# Patient Record
Sex: Male | Born: 1945 | Race: White | Hispanic: No | Marital: Married | Smoking: Never smoker
Health system: Southern US, Community
[De-identification: ages and names within clinical notes are randomized; demographics above are authoritative.]

## PROBLEM LIST (undated history)

## (undated) ENCOUNTER — Emergency Department (HOSPITAL_COMMUNITY): Payer: Medicare HMO

## (undated) DIAGNOSIS — I499 Cardiac arrhythmia, unspecified: Secondary | ICD-10-CM

## (undated) DIAGNOSIS — I209 Angina pectoris, unspecified: Secondary | ICD-10-CM

## (undated) DIAGNOSIS — I10 Essential (primary) hypertension: Secondary | ICD-10-CM

## (undated) DIAGNOSIS — I38 Endocarditis, valve unspecified: Secondary | ICD-10-CM

## (undated) DIAGNOSIS — N390 Urinary tract infection, site not specified: Secondary | ICD-10-CM

## (undated) DIAGNOSIS — C801 Malignant (primary) neoplasm, unspecified: Secondary | ICD-10-CM

## (undated) DIAGNOSIS — E119 Type 2 diabetes mellitus without complications: Secondary | ICD-10-CM

## (undated) DIAGNOSIS — G4730 Sleep apnea, unspecified: Secondary | ICD-10-CM

## (undated) DIAGNOSIS — I2510 Atherosclerotic heart disease of native coronary artery without angina pectoris: Secondary | ICD-10-CM

## (undated) HISTORY — PX: EYE SURGERY: SHX253

## (undated) HISTORY — PX: CARDIAC CATHETERIZATION: SHX172

## (undated) HISTORY — PX: JOINT REPLACEMENT: SHX530

## (undated) HISTORY — PX: OTHER SURGICAL HISTORY: SHX169

---

## 2020-11-10 ENCOUNTER — Ambulatory Visit (HOSPITAL_COMMUNITY)
Admission: AD | Admit: 2020-11-10 | Discharge: 2020-11-10 | Disposition: A | Discharge status: Home / Self Care | Payer: Medicare HMO | Attending: Ophthalmology | Admitting: Ophthalmology

## 2020-11-10 ENCOUNTER — Inpatient Hospital Stay (HOSPITAL_COMMUNITY): Payer: Medicare HMO | Admitting: Anesthesiology

## 2020-11-10 ENCOUNTER — Other Ambulatory Visit: Payer: Self-pay

## 2020-11-10 ENCOUNTER — Other Ambulatory Visit: Payer: Self-pay | Admitting: Ophthalmology

## 2020-11-10 ENCOUNTER — Encounter (HOSPITAL_COMMUNITY)
Admission: AD | Disposition: A | Discharge status: Home / Self Care | Payer: Self-pay | Source: Home / Self Care | Attending: Ophthalmology

## 2020-11-10 ENCOUNTER — Encounter (HOSPITAL_COMMUNITY): Payer: Self-pay | Admitting: Ophthalmology

## 2020-11-10 DIAGNOSIS — Z20822 Contact with and (suspected) exposure to covid-19: Secondary | ICD-10-CM | POA: Diagnosis not present

## 2020-11-10 DIAGNOSIS — Z7901 Long term (current) use of anticoagulants: Secondary | ICD-10-CM | POA: Insufficient documentation

## 2020-11-10 DIAGNOSIS — E113392 Type 2 diabetes mellitus with moderate nonproliferative diabetic retinopathy without macular edema, left eye: Secondary | ICD-10-CM | POA: Diagnosis not present

## 2020-11-10 DIAGNOSIS — Z7984 Long term (current) use of oral hypoglycemic drugs: Secondary | ICD-10-CM | POA: Diagnosis not present

## 2020-11-10 DIAGNOSIS — H33012 Retinal detachment with single break, left eye: Secondary | ICD-10-CM | POA: Diagnosis not present

## 2020-11-10 DIAGNOSIS — Z79899 Other long term (current) drug therapy: Secondary | ICD-10-CM | POA: Diagnosis not present

## 2020-11-10 DIAGNOSIS — H33002 Unspecified retinal detachment with retinal break, left eye: Secondary | ICD-10-CM

## 2020-11-10 DIAGNOSIS — H3322 Serous retinal detachment, left eye: Secondary | ICD-10-CM | POA: Diagnosis present

## 2020-11-10 HISTORY — DX: Type 2 diabetes mellitus without complications: E11.9

## 2020-11-10 HISTORY — DX: Cardiac arrhythmia, unspecified: I49.9

## 2020-11-10 HISTORY — PX: PHOTOCOAGULATION WITH LASER: SHX6027

## 2020-11-10 HISTORY — DX: Angina pectoris, unspecified: I20.9

## 2020-11-10 HISTORY — DX: Essential (primary) hypertension: I10

## 2020-11-10 HISTORY — DX: Atherosclerotic heart disease of native coronary artery without angina pectoris: I25.10

## 2020-11-10 HISTORY — DX: Sleep apnea, unspecified: G47.30

## 2020-11-10 HISTORY — PX: INJECTION OF SILICONE OIL: SHX6422

## 2020-11-10 HISTORY — PX: PARS PLANA VITRECTOMY: SHX2166

## 2020-11-10 LAB — BASIC METABOLIC PANEL
Anion gap: 8 (ref 5–15)
BUN: 22 mg/dL (ref 8–23)
CO2: 22 mmol/L (ref 22–32)
Calcium: 8.8 mg/dL — ABNORMAL LOW (ref 8.9–10.3)
Chloride: 108 mmol/L (ref 98–111)
Creatinine, Ser: 1.19 mg/dL (ref 0.61–1.24)
GFR, Estimated: >60 mL/min (ref >60)
Glucose, Bld: 147 mg/dL — ABNORMAL HIGH (ref 70–99)
Potassium: 6.9 mmol/L (ref 3.5–5.1)
Sodium: 138 mmol/L (ref 135–145)

## 2020-11-10 LAB — POCT I-STAT, CHEM 8
BUN: 22 mg/dL (ref 8–23)
Calcium, Ion: 1.12 mmol/L — ABNORMAL LOW (ref 1.15–1.40)
Chloride: 107 mmol/L (ref 98–111)
Creatinine, Ser: 1.2 mg/dL (ref 0.61–1.24)
Glucose, Bld: 117 mg/dL — ABNORMAL HIGH (ref 70–99)
HCT: 34 % — ABNORMAL LOW (ref 39.0–52.0)
Hemoglobin: 11.6 g/dL — ABNORMAL LOW (ref 13.0–17.0)
Potassium: 4.3 mmol/L (ref 3.5–5.1)
Sodium: 141 mmol/L (ref 135–145)
TCO2: 24 mmol/L (ref 22–32)

## 2020-11-10 LAB — GLUCOSE, CAPILLARY
Glucose-Capillary: 161 mg/dL — ABNORMAL HIGH (ref 70–99)
Glucose-Capillary: 95 mg/dL (ref 70–99)

## 2020-11-10 LAB — CBC
HCT: 35.1 % — ABNORMAL LOW (ref 39.0–52.0)
Hemoglobin: 11.7 g/dL — ABNORMAL LOW (ref 13.0–17.0)
MCH: 27.5 pg (ref 26.0–34.0)
MCHC: 33.3 g/dL (ref 30.0–36.0)
MCV: 82.4 fL (ref 80.0–100.0)
RBC: 4.26 MIL/uL (ref 4.22–5.81)
RDW: 17.1 % — ABNORMAL HIGH (ref 11.5–15.5)
WBC: 3 10*3/uL — ABNORMAL LOW (ref 4.0–10.5)
nRBC: 0 % (ref 0.0–0.2)

## 2020-11-10 LAB — SARS CORONAVIRUS 2 BY RT PCR (HOSPITAL ORDER, PERFORMED IN ~~LOC~~ HOSPITAL LAB): SARS Coronavirus 2: NEGATIVE

## 2020-11-10 SURGERY — PARS PLANA VITRECTOMY WITH 25 GAUGE
Anesthesia: Monitor Anesthesia Care | Site: Eye | Laterality: Left

## 2020-11-10 MED ORDER — BUPIVACAINE HCL (PF) 0.75 % IJ SOLN
INTRAMUSCULAR | Status: AC
  Filled 2020-11-10: qty 10

## 2020-11-10 MED ORDER — ORAL CARE MOUTH RINSE: 15.0000 mL | Freq: Once | OROMUCOSAL | Status: AC

## 2020-11-10 MED ORDER — MIDAZOLAM HCL 2 MG/2ML IJ SOLN
INTRAMUSCULAR | Status: AC
  Filled 2020-11-10: qty 2

## 2020-11-10 MED ORDER — LIDOCAINE HCL 2 % IJ SOLN
INTRAMUSCULAR | Status: AC
  Filled 2020-11-10: qty 20

## 2020-11-10 MED ORDER — BSS IO SOLN
INTRAOCULAR | Status: AC
  Filled 2020-11-10: qty 15

## 2020-11-10 MED ORDER — STERILE WATER FOR INJECTION IJ SOLN
INTRAMUSCULAR | Status: AC
  Filled 2020-11-10: qty 10

## 2020-11-10 MED ORDER — HYALURONIDASE HUMAN 150 UNIT/ML IJ SOLN
INTRAMUSCULAR | Status: AC
  Filled 2020-11-10: qty 1

## 2020-11-10 MED ORDER — MIDAZOLAM HCL 2 MG/2ML IJ SOLN
INTRAMUSCULAR | Status: DC | PRN
  Administered 2020-11-10: 1 mg via INTRAVENOUS

## 2020-11-10 MED ORDER — PROPOFOL 10 MG/ML IV BOLUS
INTRAVENOUS | Status: DC | PRN
  Administered 2020-11-10 (×2): 30 mg via INTRAVENOUS

## 2020-11-10 MED ORDER — HYPROMELLOSE (GONIOSCOPIC) 2.5 % OP SOLN
OPHTHALMIC | Status: DC | PRN
  Administered 2020-11-10: 1 [drp] via OPHTHALMIC

## 2020-11-10 MED ORDER — CHLORHEXIDINE GLUCONATE 0.12 % MT SOLN: 15.0000 mL | Freq: Once | OROMUCOSAL | Status: AC

## 2020-11-10 MED ORDER — EPINEPHRINE PF 1 MG/ML IJ SOLN
INTRAOCULAR | Status: DC | PRN
  Administered 2020-11-10: 15:00:00 500 mL

## 2020-11-10 MED ORDER — ATROPINE SULFATE 1 % OP SOLN
1.0000 [drp] | OPHTHALMIC | Status: DC | PRN
  Administered 2020-11-10 (×3): 1 [drp] via OPHTHALMIC
  Filled 2020-11-10: qty 5

## 2020-11-10 MED ORDER — FENTANYL CITRATE (PF) 100 MCG/2ML IJ SOLN: 25.0000 ug | INTRAMUSCULAR | Status: DC | PRN

## 2020-11-10 MED ORDER — ONDANSETRON HCL 4 MG/2ML IJ SOLN
INTRAMUSCULAR | Status: AC
  Filled 2020-11-10: qty 2

## 2020-11-10 MED ORDER — LIDOCAINE HCL 2 % IJ SOLN
INTRAMUSCULAR | Status: DC | PRN
  Administered 2020-11-10: 15:00:00 8 mL via RETROBULBAR

## 2020-11-10 MED ORDER — DEXAMETHASONE SODIUM PHOSPHATE 10 MG/ML IJ SOLN
INTRAMUSCULAR | Status: DC | PRN
  Administered 2020-11-10: 10 mg

## 2020-11-10 MED ORDER — TOBRAMYCIN-DEXAMETHASONE 0.3-0.1 % OP OINT
TOPICAL_OINTMENT | OPHTHALMIC | Status: AC
  Filled 2020-11-10: qty 3.5

## 2020-11-10 MED ORDER — DEXAMETHASONE SODIUM PHOSPHATE 10 MG/ML IJ SOLN
INTRAMUSCULAR | Status: AC
  Filled 2020-11-10: qty 1

## 2020-11-10 MED ORDER — TRIAMCINOLONE ACETONIDE 40 MG/ML IJ SUSP
INTRAMUSCULAR | Status: AC
  Filled 2020-11-10: qty 5

## 2020-11-10 MED ORDER — LACTATED RINGERS IV SOLN: INTRAVENOUS | Status: DC

## 2020-11-10 MED ORDER — ACETAMINOPHEN 500 MG PO TABS
1000.0000 mg | ORAL_TABLET | Freq: Once | ORAL | Status: AC
  Administered 2020-11-10: 13:00:00 1000 mg via ORAL
  Filled 2020-11-10: qty 2

## 2020-11-10 MED ORDER — CEFAZOLIN SUBCONJUNCTIVAL INJECTION 100 MG/0.5 ML
100.0000 mg | INJECTION | SUBCONJUNCTIVAL | Status: DC
  Filled 2020-11-10: qty 5

## 2020-11-10 MED ORDER — CEFTAZIDIME 1 G IJ SOLR
INTRAMUSCULAR | Status: AC
  Filled 2020-11-10: qty 1

## 2020-11-10 MED ORDER — ONDANSETRON HCL 4 MG/2ML IJ SOLN: 4.0000 mg | Freq: Once | INTRAMUSCULAR | Status: DC | PRN

## 2020-11-10 MED ORDER — TOBRAMYCIN-DEXAMETHASONE 0.3-0.1 % OP SUSP
OPHTHALMIC | Status: DC | PRN
  Administered 2020-11-10: 1 [drp] via OPHTHALMIC

## 2020-11-10 MED ORDER — FENTANYL CITRATE (PF) 250 MCG/5ML IJ SOLN
INTRAMUSCULAR | Status: AC
  Filled 2020-11-10: qty 5

## 2020-11-10 MED ORDER — TETRACAINE HCL 0.5 % OP SOLN
OPHTHALMIC | Status: AC
  Filled 2020-11-10: qty 4

## 2020-11-10 MED ORDER — PROPOFOL 500 MG/50ML IV EMUL
INTRAVENOUS | Status: DC | PRN
  Administered 2020-11-10: 75 ug/kg/min via INTRAVENOUS

## 2020-11-10 MED ORDER — ATROPINE SULFATE 1 % OP SOLN
OPHTHALMIC | Status: AC
  Filled 2020-11-10: qty 5

## 2020-11-10 MED ORDER — EPINEPHRINE PF 1 MG/ML IJ SOLN
INTRAMUSCULAR | Status: AC
  Filled 2020-11-10: qty 1

## 2020-11-10 MED ORDER — POLYMYXIN B SULFATE 500000 UNITS IJ SOLR
INTRAMUSCULAR | Status: AC
  Filled 2020-11-10: qty 500000

## 2020-11-10 MED ORDER — ATROPINE SULFATE 1 % OP OINT
TOPICAL_OINTMENT | OPHTHALMIC | Status: DC | PRN
  Administered 2020-11-10: 1 via OPHTHALMIC

## 2020-11-10 MED ORDER — HYPROMELLOSE (GONIOSCOPIC) 2.5 % OP SOLN
OPHTHALMIC | Status: AC
  Filled 2020-11-10: qty 15

## 2020-11-10 MED ORDER — SODIUM CHLORIDE (PF) 0.9 % IJ SOLN
INTRAMUSCULAR | Status: AC
  Filled 2020-11-10: qty 10

## 2020-11-10 MED ORDER — CEFAZOLIN SUBCONJUNCTIVAL INJECTION 100 MG/0.5 ML
INJECTION | SUBCONJUNCTIVAL | Status: DC | PRN
  Administered 2020-11-10: 100 mg via SUBCONJUNCTIVAL

## 2020-11-10 MED ORDER — PHENYLEPHRINE HCL-NACL 10-0.9 MG/250ML-% IV SOLN
INTRAVENOUS | Status: DC | PRN
  Administered 2020-11-10: 20 ug/min via INTRAVENOUS

## 2020-11-10 MED ORDER — PROPOFOL 10 MG/ML IV BOLUS
INTRAVENOUS | Status: AC
  Filled 2020-11-10: qty 20

## 2020-11-10 MED ORDER — CHLORHEXIDINE GLUCONATE 0.12 % MT SOLN
OROMUCOSAL | Status: AC
  Administered 2020-11-10: 13:00:00 15 mL via OROMUCOSAL
  Filled 2020-11-10: qty 15

## 2020-11-10 MED ORDER — ONDANSETRON HCL 4 MG/2ML IJ SOLN
INTRAMUSCULAR | Status: DC | PRN
  Administered 2020-11-10: 4 mg via INTRAVENOUS

## 2020-11-10 MED ORDER — BSS PLUS IO SOLN
INTRAOCULAR | Status: AC
  Filled 2020-11-10: qty 500

## 2020-11-10 MED ORDER — PHENYLEPHRINE HCL 2.5 % OP SOLN
1.0000 [drp] | OPHTHALMIC | Status: DC | PRN
  Administered 2020-11-10 (×3): 1 [drp] via OPHTHALMIC
  Filled 2020-11-10: qty 2

## 2020-11-10 SURGICAL SUPPLY — 42 items
APPLICATOR COTTON TIP 6 STRL (MISCELLANEOUS) ×2 IMPLANT
APPLICATOR COTTON TIP 6IN STRL (MISCELLANEOUS) ×4
APPLICATOR DR MATTHEWS STRL (MISCELLANEOUS) ×4 IMPLANT
BAND WRIST GAS GREEN (MISCELLANEOUS) IMPLANT
BNDG EYE OVAL (GAUZE/BANDAGES/DRESSINGS) ×4 IMPLANT
CABLE BIPOLOR RESECTION CORD (MISCELLANEOUS) ×4 IMPLANT
CANNULA ANT CHAM MAIN (OPHTHALMIC RELATED) ×4 IMPLANT
CANNULA ANT/CHMB 27GA (MISCELLANEOUS) ×4 IMPLANT
CANNULA TROCAR 23 GA VLV (OPHTHALMIC) IMPLANT
CANNULA VLV SOFT TIP 25GA (OPHTHALMIC) ×4 IMPLANT
CAUTERY EYE LOW TEMP 1300F FIN (OPHTHALMIC RELATED) IMPLANT
COVER MAYO STAND STRL (DRAPES) ×4 IMPLANT
COVER SURGICAL LIGHT HANDLE (MISCELLANEOUS) IMPLANT
COVER WAND RF STERILE (DRAPES) IMPLANT
DRAPE HALF SHEET 40X57 (DRAPES) ×4 IMPLANT
GAS AUTO FILL CONSTEL (OPHTHALMIC)
GAS AUTO FILL CONSTELLATION (OPHTHALMIC) IMPLANT
GAS OPHTHALMIC (MISCELLANEOUS) IMPLANT
GAS WRIST BAND GREEN (MISCELLANEOUS)
GLOVE BIO SURGEON STRL SZ7.5 (GLOVE) ×4 IMPLANT
GOWN STRL REUS W/ TWL LRG LVL3 (GOWN DISPOSABLE) ×4 IMPLANT
GOWN STRL REUS W/TWL LRG LVL3 (GOWN DISPOSABLE) ×4
KIT BASIN OR (CUSTOM PROCEDURE TRAY) ×4 IMPLANT
KIT TURNOVER KIT B (KITS) ×4 IMPLANT
LENS BIOM SUPER VIEW SET DISP (MISCELLANEOUS) ×4 IMPLANT
NEEDLE 18GX1X1/2 (RX/OR ONLY) (NEEDLE) ×4 IMPLANT
NEEDLE HYPO 25GX1X1/2 BEV (NEEDLE) IMPLANT
NEEDLE HYPO 30X.5 LL (NEEDLE) ×8 IMPLANT
NS IRRIG 1000ML POUR BTL (IV SOLUTION) ×4 IMPLANT
OIL SILICONE OPHTHALMIC 1000 (Ophthalmic Related) ×4 IMPLANT
PACK VITRECTOMY CUSTOM (CUSTOM PROCEDURE TRAY) ×4 IMPLANT
PAD ARMBOARD 7.5X6 YLW CONV (MISCELLANEOUS) ×4 IMPLANT
PAK PIK VITRECTOMY CVS 25GA (OPHTHALMIC) ×4 IMPLANT
PROBE ENDO DIATHERMY 25G (MISCELLANEOUS) ×4 IMPLANT
PROBE LASER ILLUM FLEX CVD 23G (OPHTHALMIC) IMPLANT
PROBE LASER ILLUM FLEX CVD 25G (OPHTHALMIC) ×4 IMPLANT
SET INJECTOR OIL FLUID CONSTEL (OPHTHALMIC) ×4 IMPLANT
SHIELD EYE LENSE ONLY DISP (GAUZE/BANDAGES/DRESSINGS) ×4 IMPLANT
STOCKINETTE IMPERVIOUS 9X36 MD (GAUZE/BANDAGES/DRESSINGS) ×8 IMPLANT
SYR 20ML LL LF (SYRINGE) IMPLANT
TOWEL GREEN STERILE FF (TOWEL DISPOSABLE) ×8 IMPLANT
WATER STERILE IRR 1000ML POUR (IV SOLUTION) ×4 IMPLANT

## 2020-11-10 NOTE — Anesthesia Postprocedure Evaluation (Signed)
Anesthesia Post Note  Patient: [name]  Procedure(s) Performed: PARS PLANA VITRECTOMY WITH 25 GAUGE (Left Eye) PHOTOCOAGULATION WITH LASER (Left Eye) INJECTION OF SILICONE OIL (Left Eye)     Patient location during evaluation: PACU Anesthesia Type: MAC Level of consciousness: awake and alert Pain management: pain level controlled Vital Signs Assessment: post-procedure vital signs reviewed and stable Respiratory status: spontaneous breathing and respiratory function stable Cardiovascular status: stable Postop Assessment: no apparent nausea or vomiting Anesthetic complications: no   No complications documented.  Last Vitals:  Vitals:   11/10/20 1630 11/10/20 1645  BP: 119/68 126/74  Pulse: 71 67  Resp: 19 13  Temp:    SpO2: 97% 97%    Last Pain:  Vitals:   11/10/20 1645  TempSrc:   PainSc: 0-No pain                 Leverne Amrhein DANIEL

## 2020-11-10 NOTE — H&P (Signed)
[name] is an 74 y.o. male.   Chief Complaint: vision loss OS HPI: Dx with mac off RD OS  Past Medical History:  Diagnosis Date  . Anginal pain ([place])   . Coronary artery disease   . Diabetes mellitus without complication ([place])   . Dysrhythmia   . Hypertension   . Sleep apnea     Past Surgical History:  Procedure Laterality Date  . EYE SURGERY    . JOINT REPLACEMENT Right    hip    History reviewed. No pertinent family history. Social History:  reports that he has never smoked. He has never used smokeless tobacco. He reports previous alcohol use. He reports that he does not use drugs.  Allergies: No Known Allergies  Medications Prior to Admission  Medication Sig Dispense Refill  . atorvastatin (LIPITOR) 20 MG tablet Take 20 mg by mouth daily.    Marland Kitchen ELIQUIS 5 MG TABS tablet Take 5 mg by mouth 2 (two) times daily.    Marland Kitchen glipiZIDE (GLUCOTROL) 10 MG tablet Take 10 mg by mouth 2 (two) times daily.    . hydrochlorothiazide (HYDRODIURIL) 25 MG tablet Take 12.5 mg by mouth daily.    . isosorbide mononitrate (IMDUR) 30 MG 24 hr tablet Take 30 mg by mouth daily.    Marland Kitchen LANTUS SOLOSTAR 100 UNIT/ML Solostar Pen Inject 38 Units into the skin every evening.    Marland Kitchen lisinopril (ZESTRIL) 20 MG tablet Take 20 mg by mouth daily.    . metFORMIN (GLUCOPHAGE) 1000 MG tablet Take 1,000 mg by mouth daily.    . metoprolol tartrate (LOPRESSOR) 25 MG tablet Take 25 mg by mouth 3 (three) times daily.    . nitroGLYCERIN (NITROSTAT) 0.3 MG SL tablet Place 0.3 mg under the tongue every 5 (five) minutes as needed for chest pain.    Marland Kitchen terazosin (HYTRIN) 2 MG capsule Take 2 mg by mouth at bedtime.      Results for orders placed or performed during the hospital encounter of 11/10/20 (from the past 48 hour(s))  SARS Coronavirus 2 by RT PCR (hospital order, performed in [facility] hospital lab) Nasopharyngeal Nasopharyngeal Swab     Status: None   Collection Time: 11/10/20 11:42 AM   Specimen: Nasopharyngeal  Swab  Result Value Ref Range   SARS Coronavirus 2 NEGATIVE NEGATIVE    Comment: (NOTE) SARS-CoV-2 target nucleic acids are NOT DETECTED.  The SARS-CoV-2 RNA is generally detectable in upper and lower respiratory specimens during the acute phase of infection. The lowest concentration of SARS-CoV-2 viral copies this assay can detect is 250 copies / mL. A negative result does not preclude SARS-CoV-2 infection and should not be used as the sole basis for treatment or other patient management decisions.  A negative result may occur with improper specimen collection / handling, submission of specimen other than nasopharyngeal swab, presence of viral mutation(s) within the areas targeted by this assay, and inadequate number of viral copies (<250 copies / mL). A negative result must be combined with clinical observations, patient history, and epidemiological information.  Fact Sheet for Patients:   StrictlyIdeas.no  Fact Sheet for Healthcare Providers: BankingDealers.co.za  This test is not yet approved or  cleared by the [place] FDA and has been authorized for detection and/or diagnosis of SARS-CoV-2 by FDA under an Emergency Use Authorization (EUA).  This EUA will remain in effect (meaning this test can be used) for the duration of the COVID-19 declaration under Section 564(b)(1) of the Act, 21 U.S.C.  section 360bbb-3(b)(1), unless the authorization is terminated or revoked sooner.  Performed at [facility], [place] [address] [place], [place] [address]   Glucose, capillary     Status: Abnormal   Collection Time: 11/10/20 12:04 PM  Result Value Ref Range   Glucose-Capillary 161 (H) 70 - 99 mg/dL    Comment: Glucose reference range applies only to samples taken after fasting for at least 8 hours.   No results found.  Review of Systems  Eyes: Positive for visual disturbance.  All other systems reviewed and are  negative.   Blood pressure (!) 167/76, pulse 74, temperature 97.7 F (36.5 C), temperature source Oral, resp. rate 20, height 6' (1.829 m), weight 113.4 kg, SpO2 99 %. Physical Exam Constitutional:      Appearance: He is obese.  Eyes:   Neurological:     Mental Status: He is alert.      Assessment/Plan 1. Rd OS: PPV/EC/EL/GFX vs SO OS  Jerlean Peralta B, MD 11/10/2020, 1:51 PM

## 2020-11-10 NOTE — Anesthesia Preprocedure Evaluation (Addendum)
Anesthesia Evaluation  Patient identified by MRN, date of birth, ID band Patient awake    Reviewed: Allergy & Precautions, NPO status , Patient's Chart, lab work & pertinent test results, reviewed documented beta blocker date and time   Airway Mallampati: III  TM Distance: >3 FB Neck ROM: Full    Dental  (+) Teeth Intact, Dental Advisory Given Permanent bridges:   Pulmonary sleep apnea and Continuous Positive Airway Pressure Ventilation ,    Pulmonary exam normal breath sounds clear to auscultation       Cardiovascular hypertension, Pt. on medications and Pt. on home beta blockers + CAD  + dysrhythmias (on eliquis, LD yesterday AM) Atrial Fibrillation  Rhythm:Irregular Rate:Normal  Has not taken any medications today, takes metoprolol 3x/d (last dose yesterday 10PM)   Neuro/Psych negative neurological ROS  negative psych ROS   GI/Hepatic negative GI ROS, Neg liver ROS,   Endo/Other  diabetes, Poorly Controlled, Type 2, Oral Hypoglycemic Agents, Insulin DependentT2DM on insulin-38 units last night (his normal dose) Obesity BMI 34 FS 161 at 1204  Renal/GU negative Renal ROS  negative genitourinary   Musculoskeletal negative musculoskeletal ROS (+)   Abdominal (+) + obese,   Peds  Hematology  (+) Blood dyscrasia, anemia , hct 35.1    Anesthesia Other Findings Left retinal detachment   Reproductive/Obstetrics negative OB ROS                           Anesthesia Physical Anesthesia Plan  ASA: III  Anesthesia Plan: MAC   Post-op Pain Management:    Induction:   PONV Risk Score and Plan: Propofol infusion, TIVA and Treatment may vary due to age or medical condition  Airway Management Planned: Natural Airway and Nasal Cannula  Additional Equipment: None  Intra-op Plan:   Post-operative Plan:   Informed Consent: I have reviewed the patients History and Physical, chart, labs and  discussed the procedure including the risks, benefits and alternatives for the proposed anesthesia with the patient or authorized representative who has indicated his/her understanding and acceptance.       Plan Discussed with: CRNA  Anesthesia Plan Comments:        Anesthesia Quick Evaluation

## 2020-11-10 NOTE — Transfer of Care (Signed)
Immediate Anesthesia Transfer of Care Note  Patient: [name]  Procedure(s) Performed: PARS PLANA VITRECTOMY WITH 25 GAUGE (Left Eye) PHOTOCOAGULATION WITH LASER (Left Eye) INJECTION OF SILICONE OIL (Left Eye)  Patient Location: PACU  Anesthesia Type:MAC  Level of Consciousness: drowsy  Airway & Oxygen Therapy: Patient Spontanous Breathing and Patient connected to face mask oxygen  Post-op Assessment: Report given to RN and Post -op Vital signs reviewed and stable  Post vital signs: Reviewed and stable  Last Vitals:  Vitals Value Taken Time  BP 108/59 11/10/20 1559  Temp    Pulse 61 11/10/20 1600  Resp 20 11/10/20 1600  SpO2 99 % 11/10/20 1600  Vitals shown include unvalidated device data.  Last Pain:  Vitals:   11/10/20 1233  TempSrc:   PainSc: 0-No pain         Complications: No complications documented.

## 2020-11-10 NOTE — Anesthesia Procedure Notes (Signed)
Procedure Name: MAC Date/Time: 11/10/2020 2:51 PM Performed by: Reece Agar, CRNA Pre-anesthesia Checklist: Patient identified, Emergency Drugs available, Suction available and Patient being monitored Patient Re-evaluated:Patient Re-evaluated prior to induction Oxygen Delivery Method: Simple face mask

## 2020-11-10 NOTE — OR Nursing (Signed)
Pt is awake,alert and oriented. Pt is in NAD at this time. Pt and family verbalized understanding of poc and discharge instructions. instructions given to family and reviewed prior to discharge.   

## 2020-11-10 NOTE — Progress Notes (Signed)
CRITICAL VALUE ALERT  Critical Value:  Hemolyzed potassium  Date & Time Notied: 11/10/2020 1443  Provider Notified: Dr. Salvadore Farber  Orders Received/Actions taken: Due to previous lab delay I-Stat was already in process. I- stat confirmed normokalemia.

## 2020-11-10 NOTE — Op Note (Signed)
NAMECARLYLE, [name] MEDICAL RECORD [id number] ACCOUNT [id number] DATE OF BIRTH:[birth year] FACILITY: [facility] LOCATION: [facility] PHYSICIAN:Yeraldi Fidler Greg Cutter, MD  OPERATIVE REPORT  DATE OF PROCEDURE:  11/10/2020  SURGEON:  Sherlynn Stalls, MD  ANESTHESIA:  MAC with retrobulbar block of the left eye.  PREOPERATIVE DIAGNOSIS:  Retinal detachment, left eye.  POSTOPERATIVE DIAGNOSIS:  Retinal detachment, left eye.  OPERATIVE PROCEDURE:  A 25-gauge pars plana vitrectomy with endolaser endocautery, gas fluid exchange and silicone oil insertion of the left eye.  FINDINGS:  There was a single retinal tear located at 3 o'clock in the left eye with a macula off retinal detachment that had a chronic appearance.  COMPLICATIONS:  None.  DESCRIPTION OF PROCEDURE:  The patient was identified in the preoperative holding area.  He was then taken to the operating room where he was sedated by the anesthesia team.  At that point in time, the left eye was anesthetized using a retrobulbar block  consisting of a 1:1 mixture of 0.75% bupivacaine and 1% lidocaine and 150 units of Hylenex.  After excellent akinesia and anesthesia was obtained, the left eye was prepped and draped in the usual sterile fashion for ocular surgery including trimming of  lashes.  A Lieberman speculum was placed between the left upper and lower eyelids for exposure.  25-gauge trocars were used to introduce transconjunctival cannulas in the inferotemporal, superotemporal, and superonasal quadrants.  Trocars were removed  leaving the cannulas in place.  An infusion cannula was attached to the inferior temporal cannula and confirmed to be in the vitreous cavity prior to its use.  The eye then underwent a core vitrectomy with the vitreous cutter and light pipe.  Vitreous  was cored out without difficulty.  The eye was then inspected with scleral depression.  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  There  was a temporal macula off retinal detachment with a single retinal break at 3  o'clock.  No other tears were identified.  The retinal break was marked using endocautery.  A soft tip extrusion canula was then used to perform an air-fluid exchange.  Due to the anterior location of the retinal break, a posterior retinotomy was  necessary to drain the subretinal fluid.  Therefore, endocautery was used to create a small retinotomy superior to the macula in the posterior pole.  After this was performed, a soft tip extrusion cannula was used to extrude the remaining subretinal  fluid.  This allowed the retina flatten nicely.  Endolaser photocoagulation was then placed around the retinal break and the retinotomy.  Due to the chronic nature of the retinal detachment and comorbidities of the patient, decision was made to put in  silicone oil.  Therefore, the eye was filled with 3151 centistoke silicone oil up to the lens iris diaphragm.  A peripheral iridectomy was not placed due to the patient's treatment with Eliquis.  Oil was brought up to the lens iris diaphragm.  The eye  was left comfortably soft and not over filled.  There was no oil in the anterior chamber.  Once this was completed, the cannula was removed from the sclerotomies.  Sclerotomies were inspected and found to be secure and not leaking.  Therefore, the eye  was treated with subconjunctival injections of 50 mg Ancef and 1 mg dexamethasone.  The eye was treated topically with one drop of 1% atropine and TobraDex ointment.  The speculum was removed.  Eyelids were cleaned and closed and then patched and  shielded.  The  patient was then taken to recovery in excellent condition, having tolerated the procedure very well.  HN/NUANCE  D:11/10/2020 T:11/10/2020 JOB:[id number]

## 2020-11-10 NOTE — Brief Op Note (Signed)
11/10/2020  4:00 PM  PATIENT:  [name]  74 y.o. male  PRE-OPERATIVE DIAGNOSIS:  LEFT RETNAL DETACHMENT  POST-OPERATIVE DIAGNOSIS:  LEFT RETNAL DETACHMENT  PROCEDURE:  Procedure(s): PARS PLANA VITRECTOMY WITH 25 GAUGE (Left) PHOTOCOAGULATION WITH LASER (Left) INJECTION OF SILICONE OIL (Left)  SURGEON:  Surgeon(s) and Role:    Sherlynn Stalls, MD - Primary  PHYSICIAN ASSISTANT:   ASSISTANTS: none   ANESTHESIA:   MAC  EBL:  minimal  BLOOD ADMINISTERED:none  DRAINS: none   LOCAL MEDICATIONS USED:  BUPIVICAINE   SPECIMEN:  No Specimen  DISPOSITION OF SPECIMEN:  N/A  COUNTS:  YES  TOURNIQUET:  * No tourniquets in log *  DICTATION: .Other Dictation: Dictation Number [id number]  PLAN OF CARE: Discharge to home after PACU  PATIENT DISPOSITION:  PACU - hemodynamically stable.   Delay start of Pharmacological VTE agent (>24hrs) due to surgical blood loss or risk of bleeding: not applicable

## 2020-11-11 ENCOUNTER — Encounter (HOSPITAL_COMMUNITY): Payer: Self-pay | Admitting: Ophthalmology

## 2020-11-11 LAB — GLUCOSE, CAPILLARY: Glucose-Capillary: 121 mg/dL — ABNORMAL HIGH (ref 70–99)

## 2020-12-16 ENCOUNTER — Other Ambulatory Visit: Payer: Self-pay | Admitting: Ophthalmology

## 2020-12-23 ENCOUNTER — Other Ambulatory Visit (HOSPITAL_COMMUNITY)
Admission: RE | Admit: 2020-12-23 | Discharge: 2020-12-23 | Disposition: A | Discharge status: Home / Self Care | Payer: Medicare HMO | Source: Ambulatory Visit | Attending: Ophthalmology | Admitting: Ophthalmology

## 2020-12-23 DIAGNOSIS — Z01812 Encounter for preprocedural laboratory examination: Secondary | ICD-10-CM | POA: Diagnosis present

## 2020-12-23 DIAGNOSIS — Z20822 Contact with and (suspected) exposure to covid-19: Secondary | ICD-10-CM | POA: Insufficient documentation

## 2020-12-23 LAB — SARS CORONAVIRUS 2 (TAT 6-24 HRS): SARS Coronavirus 2: NEGATIVE

## 2020-12-24 ENCOUNTER — Other Ambulatory Visit: Payer: Self-pay

## 2020-12-24 ENCOUNTER — Encounter (HOSPITAL_COMMUNITY): Payer: Self-pay | Admitting: Ophthalmology

## 2020-12-24 NOTE — Progress Notes (Signed)
PCP - [name], MD Cardiologist - Marina Goodell, MD   - recent EKG tracings requested  Chest x-ray - n/a EKG - 12/15/20 C.[facility].  Stress Test - deneis ECHO - 09/18/20 C.E.  Cardiac Cath - 11/21/20 C.[facility].  Per pt, via [place] interpreter - LD Eliquis and Plavix was 12/23/20  COVID TEST- 12/23/20 neg result  Anesthesia review: yes  -------------  SDW INSTRUCTIONS:  Your procedure is scheduled on 12/25/20. Please report to [facility] Main Entrance "A" at 11:00 A.M., and check in at the Admitting office. Call this number if you have problems the morning of surgery: [phone]   Remember: Do not eat or drink after midnight the night before your surgery   Medications to take morning of surgery with a sip of water include: amiodarone (PACERONE) isosorbide mononitrate (IMDUR) metoprolol tartrate (LOPRESSOR) nitroGLYCERIN (NITROSTAT) - if needed  Follow your surgeon's instructions on when to stop clopidogrel (PLAVIX) and ELIQUIS.  If no instructions were given by your surgeon then you will need to call the office to get those instructions.    As of today, STOP taking any Aspirin (unless otherwise instructed by your surgeon), Aleve, Naproxen, Ibuprofen, Motrin, Advil, Goody's, BC's, all herbal medications, fish oil, and all vitamins.  Diabetic medication: glipiZIDE (GLUCOTROL)   2/9: do not take evening dose  2/10: NONE LANTUS SOLOSTAR   2/9: 18 units  2/10: 18 units metFORMIN (GLUCOPHAGE)  2/9: take as usual  2/10: NONE    The Morning of Surgery Do not wear jewelry Do not wear lotions, powders, colognes, or deodorant Men may shave face and neck. Do not bring valuables to the hospital. [facility] is not responsible for any belongings or valuables. If you are a smoker, DO NOT Smoke 24 hours prior to surgery If you wear a CPAP at night please bring your mask the morning of surgery  Remember that you must have someone to transport you home after your surgery, and  remain with you for 24 hours if you are discharged the same day. Please bring cases for contacts, glasses, hearing aids, dentures or bridgework because it cannot be worn into surgery.   Patients discharged the day of surgery will not be allowed to drive home.   Please shower the NIGHT BEFORE SURGERY and the MORNING OF SURGERY with DIAL Soap. Wear comfortable clothes the morning of surgery. Oral Hygiene is also important to reduce your risk of infection.  Remember - BRUSH YOUR TEETH THE MORNING OF SURGERY WITH YOUR REGULAR TOOTHPASTE  Patient denies shortness of breath, fever, cough and chest pain.

## 2020-12-24 NOTE — Progress Notes (Signed)
Anesthesia Chart Review: Same-day work-up  Patient follows with cardiology at [facility] for history of CAD status post recent PCI of distal RCA on 11/21/2020 with DES.  In addition to RCA disease there were diffuse moderate to severe lesions as documented in cath report, medical management was recommended for these.  He is on metoprolol and amiodarone for paroxysmal A. fib and NSVT. He is anticoagulated on Eliquis.  He is on dual antiplatelet therapy with aspirin and Plavix.   Last seen by cardiologist Dr. Geanie Berlin 12/03/2020. Per note, his dyspnea has improved after recent PCI and he was walking up to 5 miles without a problem. Patient was noted to be wearing a 30-day event monitor.  His palpitations had also reportedly improved after PCI.  Dr. Geanie Berlin commented on patient holding anticoagulation for surgery.  Per note dated 12/16/2020, "if patient needs emergent surgery for retinal detachment, he could be off Eliquis for 48 hours prior to surgery and restart it as soon as okay per surgeon.  If risk of bleeding is not high, hold Eliquis only 24 hours.  I would hold aspirin considering recent PCI with DES placement.  Continue daily aspirin.  Please let us know if you have any questions."  Copy of letter on chart.  I spoke with Dr. Baird Cancer' surgical scheduler to confirm the patient will be holding Eliquis but staying on antiplatelet therapy.  She confirmed that this was correct and stated she would reach out to patient to reiterate this.  OSA on CPAP.  IDDM 2, last A1c 7.3 on 11/22/2020.  Patient will need day of surgery labs and evaluation.  EKG 12/03/2020 (copy on chart): Sinus rhythm, rate 63.  RSR (V1)-nondiagnostic.  Early repolarization changes.  Borderline criteria for LVH.  Cath and PCI 11/21/2020 (Care Everywhere): Summary:  IFR of the left anterior descending artery revealed inconsistent results,  ranging from 0.86-0.92. Angiographically, the LAD stenosis when imaged  using a 6 [place]  guide catheter appeared to be much less significant. The  lesion was estimated to have approximately 30 to 40% luminal narrowing.   The RCA stenosis was treated with PCI with implantation of a 3.0 x 15 mm  drug-eluting stent.    Cath 10/17/20 (care everywhere): Summary:  The angiogram shows an intermediate severity stenosis of the mid LAD with  approximately 60-70% luminal narrowing.  There is also a discrete  stenosis with 80-90% narrowing in the distal RCA. 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 There is also an eccentric stenosis with 70%  narrowing involving the proximal circumflex artery and the mid segment of  the large first OM branch.   Plan:  His images will be reviewed with the interventional and CT surgery team to  determine the best means of revascularization (PCI vs CABG). It is  anticipated he would benefit from [facility] of the mid LAD, and this will  tentatively be planned for next week.     TTE 09/18/2020: AorticValve: The aortic valve is tricuspid. The leaflets are mildly  thickened and exhibit mildly reduced excursion. The leaflets are mildly  calcified.  . LeftVentricle: Wall motion is normal.  . MitralValve: There is mild posterior annular calcification.  . Aorta: The aortic root is at upper limit of normal in size-3.6 cm.   Proximal ascending aorta is mildly to moderately dilated measuring 4.6 cm.  . LeftVentricle: Systolic function is normal. EF: 60%. ; GLS = -18.200%  from the apical 4,3,2 chamber views respectively.  . LeftAtrium: Left atrium is moderately  dilated.  Marland Kitchen LeftVentricle: Doppler parameters consistent with mild diastolic  dysfunction and low to normal LA pressure.  . AorticValve: There is no evidence of aortic valve stenosis. Mean  gradient 10 mmHg, peak gradient 17 mmHg. Maximum velocity 2.1 m/s.   Dimensionless index 0.43. Calculated aortic valve area 2.07 cm  consistent with a low normal range.  . MitralValve:  Mitral valve structure is normal. The leaflets are mildly  thickened and exhibit normal excursion.  . TricuspidValve: The right ventricular systolic pressure is normal (<36  mmHg).  . LeftVentricle: There is mild to moderate concentric hypertrophy.  . RightVentricle: There is mild hypertrophy.   Wynonia Musty [facility]/Anesthesiology Phone [phone] 12/24/2020 12:56 PM

## 2020-12-24 NOTE — Anesthesia Preprocedure Evaluation (Addendum)
Anesthesia Evaluation  Patient identified by MRN, date of birth, ID band Patient awake    Reviewed: Allergy & Precautions, NPO status , Patient's Chart, lab work & pertinent test results  Airway Mallampati: III  TM Distance: >3 FB Neck ROM: Full    Dental no notable dental hx.    Pulmonary sleep apnea ,    Pulmonary exam normal breath sounds clear to auscultation       Cardiovascular hypertension, + angina + CAD and + Cardiac Stents (DES 11/2020 on Aspirin/Plavix)  Normal cardiovascular exam+ dysrhythmias (on Eliquis) Atrial Fibrillation  Rhythm:Regular Rate:Normal     Neuro/Psych negative neurological ROS  negative psych ROS   GI/Hepatic negative GI ROS, Neg liver ROS,   Endo/Other  diabetes, Type 2, Insulin Dependent  Renal/GU negative Renal ROS  negative genitourinary   Musculoskeletal negative musculoskeletal ROS (+)   Abdominal Normal abdominal exam  (+)   Peds negative pediatric ROS (+)  Hematology negative hematology ROS (+)   Anesthesia Other Findings   Reproductive/Obstetrics negative OB ROS                          Anesthesia Physical Anesthesia Plan  ASA: IV  Anesthesia Plan: General   Post-op Pain Management:    Induction: Intravenous  PONV Risk Score and Plan: Ondansetron and Treatment may vary due to age or medical condition  Airway Management Planned: LMA  Additional Equipment: None  Intra-op Plan:   Post-operative Plan: Extubation in OR  Informed Consent: I have reviewed the patients History and Physical, chart, labs and discussed the procedure including the risks, benefits and alternatives for the proposed anesthesia with the patient or authorized representative who has indicated his/her understanding and acceptance.     Dental advisory given and Interpreter used for interveiw  Plan Discussed with: CRNA and Anesthesiologist  Anesthesia Plan Comments: (  LMA. Will discuss with surgeon. Patient has been on Eliquis and Plavix. Last dose of Eliquis 2/8; Last dose of Plavix 2/9  PAT note by Karoline Caldwell, PA-C: Patient follows with cardiology at [facility] for history of CAD status post recent PCI of distal RCA on 11/21/2020 with DES.  In addition to RCA disease there were diffuse moderate to severe lesions as documented in cath report, medical management was recommended for these.  He is on metoprolol and amiodarone for paroxysmal A. fib and NSVT. He is anticoagulated on Eliquis.  He is on dual antiplatelet therapy with aspirin and Plavix.  Dr. Baird Cancer has requested General anesthesia.   Last seen by cardiologist Dr. Geanie Berlin 12/03/2020. Per note, his dyspnea has improved after recent PCI and he was walking up to 5 miles without a problem. Patient was noted to be wearing a 30-day event monitor.  His palpitations had also reportedly improved after PCI.  Dr. Geanie Berlin commented on patient holding anticoagulation for surgery.  Per note dated 12/16/2020, "if patient needs emergent surgery for retinal detachment, he could be off Eliquis for 48 hours prior to surgery and restart it as soon as okay per surgeon.  If risk of bleeding is not high, hold Eliquis only 24 hours.  I would hold aspirin considering recent PCI with DES placement.  Continue daily aspirin.  Please let us know if you have any questions."  Copy of letter on chart.  I spoke with Dr. Baird Cancer' surgical scheduler to confirm the patient will be holding Eliquis but staying on antiplatelet therapy.  She confirmed that this was correct and  stated she would reach out to patient to reiterate this.  OSA on CPAP.  IDDM 2, last A1c 7.3 on 11/22/2020.  Patient will need day of surgery labs and evaluation.  EKG 12/03/2020 (copy on chart): Sinus rhythm, rate 63.  RSR (V1)-nondiagnostic.  Early repolarization changes.  Borderline criteria for LVH.  Cath and PCI 11/21/2020 (Care Everywhere): Summary:  IFR of the  left anterior descending artery revealed inconsistent results,  ranging from 0.86-0.92. Angiographically, the LAD stenosis when imaged  using a 6 [place] guide catheter appeared to be much less significant. The  lesion was estimated to have approximately 30 to 40% luminal narrowing.   The RCA stenosis was treated with PCI with implantation of a 3.0 x 15 mm  drug-eluting stent.    Cath 10/17/20 (care everywhere): Summary:  The angiogram shows an intermediate severity stenosis of the mid LAD with  approximately 60-70% luminal narrowing.  There is also a discrete  stenosis with 80-90% narrowing in the distal RCA. 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 There is also an eccentric stenosis with 70%  narrowing involving the proximal circumflex artery and the mid segment of  the large first OM branch.   Plan:  His images will be reviewed with the interventional and CT surgery team to  determine the best means of revascularization (PCI vs CABG). It is  anticipated he would benefit from [facility] of the mid LAD, and this will  tentatively be planned for next week.     TTE 09/18/2020: AorticValve: The aortic valve is tricuspid. The leaflets are mildly  thickened and exhibit mildly reduced excursion. The leaflets are mildly  calcified.  . LeftVentricle: Wall motion is normal.  . MitralValve: There is mild posterior annular calcification.  . Aorta: The aortic root is at upper limit of normal in size-3.6 cm.   Proximal ascending aorta is mildly to moderately dilated measuring 4.6 cm.  . LeftVentricle: Systolic function is normal. EF: 60%. ; GLS = -18.200%  from the apical 4,3,2 chamber views respectively.  . LeftAtrium: Left atrium is moderately dilated.  Marland Kitchen LeftVentricle: Doppler parameters consistent with mild diastolic  dysfunction and low to normal LA pressure.  . AorticValve: There is no evidence of aortic valve stenosis. Mean  gradient 10 mmHg, peak gradient 17 mmHg.  Maximum velocity 2.1 m/s.   Dimensionless index 0.43. Calculated aortic valve area 2.07 cm  consistent with a low normal range.  . MitralValve: Mitral valve structure is normal. The leaflets are mildly  thickened and exhibit normal excursion.  . TricuspidValve: The right ventricular systolic pressure is normal (<36  mmHg).  . LeftVentricle: There is mild to moderate concentric hypertrophy.  . RightVentricle: There is mild hypertrophy.  )     Anesthesia Quick Evaluation

## 2020-12-25 ENCOUNTER — Ambulatory Visit (HOSPITAL_COMMUNITY)
Admission: RE | Admit: 2020-12-25 | Discharge: 2020-12-25 | Disposition: A | Discharge status: Home / Self Care | Payer: Medicare HMO | Attending: Ophthalmology | Admitting: Ophthalmology

## 2020-12-25 ENCOUNTER — Encounter (HOSPITAL_COMMUNITY): Payer: Self-pay | Admitting: Ophthalmology

## 2020-12-25 ENCOUNTER — Encounter (HOSPITAL_COMMUNITY)
Admission: RE | Disposition: A | Discharge status: Home / Self Care | Payer: Self-pay | Source: Home / Self Care | Attending: Ophthalmology

## 2020-12-25 ENCOUNTER — Ambulatory Visit (HOSPITAL_COMMUNITY): Payer: Medicare HMO | Admitting: Physician Assistant

## 2020-12-25 DIAGNOSIS — Z7901 Long term (current) use of anticoagulants: Secondary | ICD-10-CM | POA: Diagnosis not present

## 2020-12-25 DIAGNOSIS — Z794 Long term (current) use of insulin: Secondary | ICD-10-CM | POA: Insufficient documentation

## 2020-12-25 DIAGNOSIS — H3322 Serous retinal detachment, left eye: Secondary | ICD-10-CM | POA: Diagnosis present

## 2020-12-25 DIAGNOSIS — Z955 Presence of coronary angioplasty implant and graft: Secondary | ICD-10-CM | POA: Diagnosis not present

## 2020-12-25 DIAGNOSIS — Z7984 Long term (current) use of oral hypoglycemic drugs: Secondary | ICD-10-CM | POA: Diagnosis not present

## 2020-12-25 DIAGNOSIS — Z79899 Other long term (current) drug therapy: Secondary | ICD-10-CM | POA: Insufficient documentation

## 2020-12-25 DIAGNOSIS — H3321 Serous retinal detachment, right eye: Secondary | ICD-10-CM

## 2020-12-25 HISTORY — PX: SILICON OIL REMOVAL: SHX5305

## 2020-12-25 HISTORY — PX: INJECTION OF SILICONE OIL: SHX6422

## 2020-12-25 HISTORY — PX: PARS PLANA VITRECTOMY: SHX2166

## 2020-12-25 HISTORY — PX: REPAIR OF COMPLEX TRACTION RETINAL DETACHMENT: SHX6217

## 2020-12-25 HISTORY — PX: PERFLUORONE INJECTION: SHX5302

## 2020-12-25 HISTORY — PX: LASER PHOTO ABLATION: SHX5942

## 2020-12-25 LAB — BASIC METABOLIC PANEL
Anion gap: 10 (ref 5–15)
BUN: 27 mg/dL — ABNORMAL HIGH (ref 8–23)
CO2: 22 mmol/L (ref 22–32)
Calcium: 8.8 mg/dL — ABNORMAL LOW (ref 8.9–10.3)
Chloride: 108 mmol/L (ref 98–111)
Creatinine, Ser: 1.4 mg/dL — ABNORMAL HIGH (ref 0.61–1.24)
GFR, Estimated: 53 mL/min — ABNORMAL LOW (ref >60)
Glucose, Bld: 167 mg/dL — ABNORMAL HIGH (ref 70–99)
Potassium: 4.4 mmol/L (ref 3.5–5.1)
Sodium: 140 mmol/L (ref 135–145)

## 2020-12-25 LAB — GLUCOSE, CAPILLARY
Glucose-Capillary: 172 mg/dL — ABNORMAL HIGH (ref 70–99)
Glucose-Capillary: 138 mg/dL — ABNORMAL HIGH (ref 70–99)

## 2020-12-25 SURGERY — REMOVAL, SILICONE OIL, EYE
Anesthesia: General | Site: Eye | Laterality: Left

## 2020-12-25 MED ORDER — PHENYLEPHRINE HCL-NACL 10-0.9 MG/250ML-% IV SOLN
INTRAVENOUS | Status: DC | PRN
  Administered 2020-12-25: 40 ug/min via INTRAVENOUS

## 2020-12-25 MED ORDER — HYALURONIDASE HUMAN 150 UNIT/ML IJ SOLN
INTRAMUSCULAR | Status: AC
  Filled 2020-12-25: qty 1

## 2020-12-25 MED ORDER — TETRACAINE HCL 0.5 % OP SOLN
OPHTHALMIC | Status: AC
  Filled 2020-12-25: qty 4

## 2020-12-25 MED ORDER — FENTANYL CITRATE (PF) 250 MCG/5ML IJ SOLN
INTRAMUSCULAR | Status: AC
  Filled 2020-12-25: qty 5

## 2020-12-25 MED ORDER — ATROPINE SULFATE 1 % OP SOLN
OPHTHALMIC | Status: AC
  Filled 2020-12-25: qty 5

## 2020-12-25 MED ORDER — LIDOCAINE HCL 2 % IJ SOLN
INTRAMUSCULAR | Status: AC
  Filled 2020-12-25: qty 20

## 2020-12-25 MED ORDER — PHENYLEPHRINE HCL 2.5 % OP SOLN
1.0000 [drp] | OPHTHALMIC | Status: DC | PRN
  Administered 2020-12-25 (×3): 1 [drp] via OPHTHALMIC
  Filled 2020-12-25: qty 2

## 2020-12-25 MED ORDER — CHLORHEXIDINE GLUCONATE 0.12 % MT SOLN
15.0000 mL | Freq: Once | OROMUCOSAL | Status: AC
  Administered 2020-12-25: 15 mL via OROMUCOSAL
  Filled 2020-12-25: qty 15

## 2020-12-25 MED ORDER — BSS PLUS IO SOLN
INTRAOCULAR | Status: AC
  Filled 2020-12-25: qty 500

## 2020-12-25 MED ORDER — LACTATED RINGERS IV SOLN: INTRAVENOUS | Status: DC

## 2020-12-25 MED ORDER — ONDANSETRON HCL 4 MG/2ML IJ SOLN
INTRAMUSCULAR | Status: AC
  Filled 2020-12-25: qty 2

## 2020-12-25 MED ORDER — EPINEPHRINE PF 1 MG/ML IJ SOLN
INTRAOCULAR | Status: DC | PRN
  Administered 2020-12-25: 500 mL

## 2020-12-25 MED ORDER — BUPIVACAINE HCL (PF) 0.75 % IJ SOLN
INTRAMUSCULAR | Status: AC
  Filled 2020-12-25: qty 10

## 2020-12-25 MED ORDER — DEXAMETHASONE SODIUM PHOSPHATE 10 MG/ML IJ SOLN
INTRAMUSCULAR | Status: AC
  Filled 2020-12-25: qty 1

## 2020-12-25 MED ORDER — HYPROMELLOSE (GONIOSCOPIC) 2.5 % OP SOLN
OPHTHALMIC | Status: DC | PRN
  Administered 2020-12-25: 1 [drp] via OPHTHALMIC

## 2020-12-25 MED ORDER — ONDANSETRON HCL 4 MG/2ML IJ SOLN
INTRAMUSCULAR | Status: DC | PRN
  Administered 2020-12-25: 4 mg via INTRAVENOUS

## 2020-12-25 MED ORDER — BSS IO SOLN
INTRAOCULAR | Status: DC | PRN
  Administered 2020-12-25: 15 mL via INTRAOCULAR

## 2020-12-25 MED ORDER — ATROPINE SULFATE 1 % OP SOLN
1.0000 [drp] | OPHTHALMIC | Status: DC | PRN
  Administered 2020-12-25 (×3): 1 [drp] via OPHTHALMIC
  Filled 2020-12-25: qty 5

## 2020-12-25 MED ORDER — PROPOFOL 10 MG/ML IV BOLUS
INTRAVENOUS | Status: DC | PRN
  Administered 2020-12-25: 150 mg via INTRAVENOUS

## 2020-12-25 MED ORDER — PROPOFOL 10 MG/ML IV BOLUS
INTRAVENOUS | Status: AC
  Filled 2020-12-25: qty 20

## 2020-12-25 MED ORDER — EPINEPHRINE PF 1 MG/ML IJ SOLN
INTRAMUSCULAR | Status: AC
  Filled 2020-12-25: qty 1

## 2020-12-25 MED ORDER — CEFAZOLIN SUBCONJUNCTIVAL INJECTION 100 MG/0.5 ML
100.0000 mg | INJECTION | SUBCONJUNCTIVAL | Status: AC
  Administered 2020-12-25: 100 mg via SUBCONJUNCTIVAL
  Filled 2020-12-25: qty 1

## 2020-12-25 MED ORDER — LIDOCAINE HCL 2 % IJ SOLN
INTRAMUSCULAR | Status: DC | PRN
  Administered 2020-12-25: 6 mL via RETROBULBAR

## 2020-12-25 MED ORDER — DEXAMETHASONE SODIUM PHOSPHATE 10 MG/ML IJ SOLN
INTRAMUSCULAR | Status: DC | PRN
  Administered 2020-12-25: 10 mg

## 2020-12-25 MED ORDER — FENTANYL CITRATE (PF) 100 MCG/2ML IJ SOLN
INTRAMUSCULAR | Status: DC | PRN
  Administered 2020-12-25: 50 ug via INTRAVENOUS

## 2020-12-25 MED ORDER — INDOCYANINE GREEN 25 MG IV SOLR
INTRAVENOUS | Status: AC
  Filled 2020-12-25: qty 10

## 2020-12-25 MED ORDER — LIDOCAINE 2% (20 MG/ML) 5 ML SYRINGE
INTRAMUSCULAR | Status: AC
  Filled 2020-12-25: qty 5

## 2020-12-25 MED ORDER — TOBRAMYCIN-DEXAMETHASONE 0.3-0.1 % OP OINT
TOPICAL_OINTMENT | OPHTHALMIC | Status: AC
  Filled 2020-12-25: qty 3.5

## 2020-12-25 MED ORDER — BSS IO SOLN
INTRAOCULAR | Status: AC
  Filled 2020-12-25: qty 15

## 2020-12-25 MED ORDER — HYPROMELLOSE (GONIOSCOPIC) 2.5 % OP SOLN
OPHTHALMIC | Status: AC
  Filled 2020-12-25: qty 15

## 2020-12-25 MED ORDER — ORAL CARE MOUTH RINSE: 15.0000 mL | Freq: Once | OROMUCOSAL | Status: AC

## 2020-12-25 MED ORDER — EPHEDRINE SULFATE-NACL 50-0.9 MG/10ML-% IV SOSY
PREFILLED_SYRINGE | INTRAVENOUS | Status: DC | PRN
  Administered 2020-12-25 (×2): 10 mg via INTRAVENOUS

## 2020-12-25 MED ORDER — TOBRAMYCIN-DEXAMETHASONE 0.3-0.1 % OP OINT
TOPICAL_OINTMENT | OPHTHALMIC | Status: DC | PRN
  Administered 2020-12-25: 1 via OPHTHALMIC

## 2020-12-25 MED ORDER — LIDOCAINE 2% (20 MG/ML) 5 ML SYRINGE
INTRAMUSCULAR | Status: DC | PRN
  Administered 2020-12-25: 80 mg via INTRAVENOUS

## 2020-12-25 SURGICAL SUPPLY — 45 items
APPLICATOR COTTON TIP 6 STRL (MISCELLANEOUS) ×1 IMPLANT
APPLICATOR COTTON TIP 6IN STRL (MISCELLANEOUS) ×2
BLADE MINI 60D BLUE (BLADE) ×2 IMPLANT
CABLE BIPOLOR RESECTION CORD (MISCELLANEOUS) ×2 IMPLANT
CANNULA ANT CHAM MAIN (OPHTHALMIC RELATED) IMPLANT
CANNULA DUALBORE 25G (CANNULA) ×2 IMPLANT
CANNULA VLV SOFT TIP 25GA (OPHTHALMIC) ×2 IMPLANT
CLSR STERI-STRIP ANTIMIC 1/2X4 (GAUZE/BANDAGES/DRESSINGS) ×2 IMPLANT
COVER MAYO STAND STRL (DRAPES) ×2 IMPLANT
DRAPE HALF SHEET 40X57 (DRAPES) ×2 IMPLANT
DRAPE INCISE 51X51 W/FILM STRL (DRAPES) ×2 IMPLANT
ERASER HMR WETFIELD 23G BP (MISCELLANEOUS) IMPLANT
FORCEPS GRIESHABER ILM 25G A (INSTRUMENTS) ×2 IMPLANT
GLOVE SURG SYN 7.5  E (GLOVE) ×1
GLOVE SURG SYN 7.5 E (GLOVE) ×1 IMPLANT
GOWN STRL REUS W/ TWL LRG LVL3 (GOWN DISPOSABLE) ×1 IMPLANT
GOWN STRL REUS W/TWL LRG LVL3 (GOWN DISPOSABLE) ×1
KIT BASIN OR (CUSTOM PROCEDURE TRAY) ×2 IMPLANT
KIT PERFLUORON PROCEDURE 5ML (MISCELLANEOUS) ×2 IMPLANT
KIT TURNOVER KIT B (KITS) ×2 IMPLANT
LENS BIOM SUPER VIEW SET DISP (MISCELLANEOUS) ×2 IMPLANT
NEEDLE 18GX1X1/2 (RX/OR ONLY) (NEEDLE) ×2 IMPLANT
NEEDLE 25GX 5/8IN NON SAFETY (NEEDLE) ×2 IMPLANT
NEEDLE FILTER BLUNT 18X 1/2SAF (NEEDLE) ×1
NEEDLE FILTER BLUNT 18X1 1/2 (NEEDLE) ×1 IMPLANT
NEEDLE HYPO 30X.5 LL (NEEDLE) ×4 IMPLANT
NEEDLE RETROBULBAR 25GX1.5 (NEEDLE) ×2 IMPLANT
NS IRRIG 1000ML POUR BTL (IV SOLUTION) ×2 IMPLANT
OIL SILICONE OPHTHALMIC 1000 (Ophthalmic Related) ×2 IMPLANT
PACK VITRECTOMY CUSTOM (CUSTOM PROCEDURE TRAY) ×2 IMPLANT
PAD ARMBOARD 7.5X6 YLW CONV (MISCELLANEOUS) ×4 IMPLANT
PAK PIK VITRECTOMY CVS 25GA (OPHTHALMIC) ×2 IMPLANT
PROBE ENDO DIATHERMY 25G (MISCELLANEOUS) ×2 IMPLANT
PROBE LASER ILLUM FLEX CVD 25G (OPHTHALMIC) ×2 IMPLANT
ROLLS DENTAL (MISCELLANEOUS) IMPLANT
SCRAPER DIAMOND 25GA (OPHTHALMIC RELATED) IMPLANT
SET INJECTOR OIL FLUID CONSTEL (OPHTHALMIC) ×4 IMPLANT
SUT VICRYL 7 0 TG140 8 (SUTURE) IMPLANT
SUT VICRYL 8 0 TG140 8 (SUTURE) IMPLANT
SYR 10ML LL (SYRINGE) IMPLANT
SYR 20ML LL LF (SYRINGE) ×2 IMPLANT
SYR 5ML LL (SYRINGE) ×2 IMPLANT
SYR TB 1ML LUER SLIP (SYRINGE) IMPLANT
WATER STERILE IRR 1000ML POUR (IV SOLUTION) ×2 IMPLANT
WIPE INSTRUMENT VISIWIPE 73X73 (MISCELLANEOUS) IMPLANT

## 2020-12-25 NOTE — Op Note (Signed)
[name], ABDON MEDICAL RECORD [id number] ACCOUNT [id number] DATE OF BIRTH:11-29-1945 FACILITY: [facility] LOCATION: [facility] PHYSICIAN:Christia Coaxum Greg Cutter, MD  OPERATIVE REPORT  DATE OF PROCEDURE:  12/25/2020  SURGEON:  Sherlynn Stalls, MD  ANESTHESIA:  General with laryngeal mask inhalation.  PREOPERATIVE DIAGNOSIS:  Retinal detachment of the left eye.  POSTOPERATIVE DIAGNOSIS:  Retinal detachment of the left eye.  OPERATIVE PROCEDURE:  A 25-gauge pars plana vitrectomy with peripheral retinectomy, endocautery endolaser, perfluoron silicone oil exchange of the left eye.  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  COMPLICATIONS:  None.  DESCRIPTION OF PROCEDURE:  The patient was identified in the preoperative holding area.  A signed consent form was placed on the chart, and he was taken to the operating room where he was placed under general anesthesia.  The left eye was then prepped  and draped in the usual sterile fashion for ocular surgery.  A 25-gauge trocar was used to introduce transconjunctival cannulas in the inferior temporal, superior temporal, and superior nasal quadrants.  The trocars were removed leaving the cannulas in  place.  An infusion cannula was then placed in the inferior temporal location and confirmed to be in the vitreous cavity prior to its use.  Once this was completed, the eye underwent a core vitrectomy with vitreous cutter and light pipe.  Vitreous was  already removed mostly from previous surgery.  Vitreous was cleaned out.  Viscous fluid injector was then used to extract previous silicone oil, which had been placed from previous surgery.  Once the oil was out, the retina was inspected.  There was an  inferior retinal detachment.  The macula was attached.  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  Attempts were made to dissect the star folds, but the  retina was not healthy enough to withstand  dissection.  Therefore, decision was made to perform an inferior retinectomy.  Endocautery was used to delineate the retinectomy.  Once this was completed, the anterior portion of the inferior retina was incised and removed using the vitrector.  This was  performed without difficulty.  Hemostasis was excellent.  Perfluoron was then used to stabilize the posterior pole.  A medium-sized perfluoron bubble was deposited to hold the posterior pole in place.  Once this was stabilized, a soft tip extrusion  cannula was then used to perform an air-fluid exchange.  This allowed the retina to flatten completely and remove all the subretinal fluid.  Next, endolaser photocoagulation was then placed around to delineate and secure the posterior retinectomy.  At  this point in time, the retina looked really good, very stable.  There was no subretinal fluid.  Air fluid exchange was completed removing all the subretinal fluid and the perfluoron.  At this point in time, the eye was just full of air.  The retina was  stable.  A 69 blade was used to remove the corneal epithelium to improve the view and safety of the procedure.  This was performed without difficulty, and the epithelium was discarded.  Additional laser was placed at this point for stability and to  improve the chances that the retina would remain stable and attached.  After this was completed, the eye was filled with 4401 [place] silicone oil up to the lens iris diaphragm.  This was performed without difficulty.  Next, the cannulas were removed  from the sclerotomies.  Sclerotomies were inspected and found to be secure and not leaking.  The eye  was treated with subconjunctival injections of 50 mg of Ancef to 1 mg of dexamethasone.  Eye was treated topically with one drop of 1% atropine and  TobraDex ointment.  The speculum was removed.  Eyelids were cleaned and closed.  The eye was then patched and shielded.  The  patient was then taken to recovery in excellent condition having tolerated the procedure well.  IN/NUANCE  D:12/25/2020 T:12/25/2020 JOB:[id number]

## 2020-12-25 NOTE — Brief Op Note (Signed)
12/25/2020  3:28 PM  PATIENT:  [name]  75 y.o. male  PRE-OPERATIVE DIAGNOSIS:  retinal detachment left eye  POST-OPERATIVE DIAGNOSIS:  retinal detachment left eye  PROCEDURE:  Procedure(s): SILICONE OIL REMOVAL (Left) INJECTION OF SILICONE OIL (Left) RETINECTOMY LEFT EYE (Left) PARS PLANA VITRECTOMY WITH 25 GAUGE IN LEFT EYE (Left) PERFLUORONE INJECTION (Left) LASER PHOTO ABLATION (Left)  SURGEON:  Surgeon(s) and Role:    Sherlynn Stalls, MD - Primary  PHYSICIAN ASSISTANT:   ASSISTANTS: none   ANESTHESIA:   general  EBL:  2 mL   BLOOD ADMINISTERED:none  DRAINS: none   LOCAL MEDICATIONS USED:  BUPIVICAINE   SPECIMEN:  No Specimen  DISPOSITION OF SPECIMEN:  N/A  COUNTS:  YES  TOURNIQUET:  * No tourniquets in log *  DICTATION: .Other Dictation: Dictation Number [id number]  PLAN OF CARE: Discharge to home after PACU  PATIENT DISPOSITION:  PACU - hemodynamically stable.   Delay start of Pharmacological VTE agent (>24hrs) due to surgical blood loss or risk of bleeding: not applicable

## 2020-12-25 NOTE — Transfer of Care (Signed)
Immediate Anesthesia Transfer of Care Note  Patient: [name]  Procedure(s) Performed: SILICONE OIL REMOVAL (Left Eye) INJECTION OF SILICONE OIL (Left Eye) RETINECTOMY LEFT EYE (Left Eye) PARS PLANA VITRECTOMY WITH 25 GAUGE IN LEFT EYE (Left Eye) PERFLUORONE INJECTION (Left Eye) LASER PHOTO ABLATION (Left Eye)  Patient Location: PACU  Anesthesia Type:General  Level of Consciousness: drowsy  Airway & Oxygen Therapy: Patient Spontanous Breathing and Patient connected to nasal cannula oxygen  Post-op Assessment: Report given to RN and Post -op Vital signs reviewed and stable  Post vital signs: Reviewed and stable  Last Vitals:  Vitals Value Taken Time  BP 140/65 12/25/20 1502  Temp    Pulse 64 12/25/20 1505  Resp 15 12/25/20 1505  SpO2 96 % 12/25/20 1505  Vitals shown include unvalidated device data.  Last Pain:  Vitals:   12/25/20 1123  TempSrc: Oral  PainSc: 0-No pain         Complications: No complications documented.

## 2020-12-25 NOTE — Progress Notes (Signed)
[place] interpreter Myra Gianotti [phone] Call when surgery over

## 2020-12-25 NOTE — H&P (Signed)
[name] is an 75 y.o. male.   Chief Complaint: vision loss OS HPI: diagnosed with RD OS  Past Medical History:  Diagnosis Date  . Anginal pain ([place])   . Coronary artery disease   . Diabetes mellitus without complication ([place])   . Dysrhythmia   . Hypertension   . Sleep apnea     Past Surgical History:  Procedure Laterality Date  . CARDIAC CATHETERIZATION    . EYE SURGERY    . INJECTION OF SILICONE OIL Left 37/62/8315   Procedure: INJECTION OF SILICONE OIL;  Surgeon: Sherlynn Stalls, MD;  Location: [place];  Service: Ophthalmology;  Laterality: Left;  . JOINT REPLACEMENT Right    hip  . PARS PLANA VITRECTOMY Left 11/10/2020   Procedure: PARS PLANA VITRECTOMY WITH 25 GAUGE;  Surgeon: Sherlynn Stalls, MD;  Location: [place];  Service: Ophthalmology;  Laterality: Left;  . PHOTOCOAGULATION WITH LASER Left 11/10/2020   Procedure: PHOTOCOAGULATION WITH LASER;  Surgeon: Sherlynn Stalls, MD;  Location: [place];  Service: Ophthalmology;  Laterality: Left;    History reviewed. No pertinent family history. Social History:  reports that he has never smoked. He has never used smokeless tobacco. He reports previous alcohol use. He reports that he does not use drugs.  Allergies: No Known Allergies  Medications Prior to Admission  Medication Sig Dispense Refill  . amiodarone (PACERONE) 400 MG tablet Take 400 mg by mouth daily.    . Ascorbic Acid (VITAMIN C PO) Take 1 tablet by mouth every other day.    Marland Kitchen atorvastatin (LIPITOR) 20 MG tablet Take 20 mg by mouth every evening.    . Cholecalciferol (VITAMIN D3 PO) Take 1 tablet by mouth every other day.    . clopidogrel (PLAVIX) 75 MG tablet Take 75 mg by mouth daily.    Marland Kitchen ELIQUIS 5 MG TABS tablet Take 5 mg by mouth 2 (two) times daily.    Marland Kitchen glipiZIDE (GLUCOTROL) 10 MG tablet Take 10 mg by mouth 2 (two) times daily.    . hydrochlorothiazide (HYDRODIURIL) 25 MG tablet Take 25 mg by mouth daily.    . isosorbide mononitrate (IMDUR) 30 MG 24 hr  tablet Take 30 mg by mouth daily.    Marland Kitchen LANTUS SOLOSTAR 100 UNIT/ML Solostar Pen Inject 36 Units into the skin every evening.    Marland Kitchen lisinopril (ZESTRIL) 20 MG tablet Take 20 mg by mouth daily.    . metFORMIN (GLUCOPHAGE) 1000 MG tablet Take 1,000 mg by mouth in the morning and at bedtime.    . metoprolol tartrate (LOPRESSOR) 25 MG tablet Take 25 mg by mouth 3 (three) times daily. (Morning, noon, evening)    . Multiple Vitamins-Minerals (ZINC PO) Take 1 tablet by mouth every other day.    . nitroGLYCERIN (NITROSTAT) 0.3 MG SL tablet Place 0.3 mg under the tongue every 5 (five) minutes x 3 doses as needed for chest pain.    . prednisoLONE acetate (PRED FORTE) 1 % ophthalmic suspension Place 1 drop into the left eye in the morning, at noon, and at bedtime.    Marland Kitchen terazosin (HYTRIN) 2 MG capsule Take 2 mg by mouth at bedtime.      Results for orders placed or performed during the hospital encounter of 12/25/20 (from the past 48 hour(s))  Basic metabolic panel per protocol     Status: Abnormal   Collection Time: 12/25/20 11:03 AM  Result Value Ref Range   Sodium 140 135 - 145 mmol/L   Potassium 4.4 3.5 -  5.1 mmol/L   Chloride 108 98 - 111 mmol/L   CO2 22 22 - 32 mmol/L   Glucose, Bld 167 (H) 70 - 99 mg/dL    Comment: Glucose reference range applies only to samples taken after fasting for at least 8 hours.   BUN 27 (H) 8 - 23 mg/dL   Creatinine, Ser 1.40 (H) 0.61 - 1.24 mg/dL   Calcium 8.8 (L) 8.9 - 10.3 mg/dL   GFR, Estimated 53 (L) >60 mL/min    Comment: (NOTE) Calculated using the CKD-EPI Creatinine Equation (2021)    Anion gap 10 5 - 15    Comment: Performed at [facility] [address], [place], [place] [address]  Glucose, capillary     Status: Abnormal   Collection Time: 12/25/20 11:26 AM  Result Value Ref Range   Glucose-Capillary 172 (H) 70 - 99 mg/dL    Comment: Glucose reference range applies only to samples taken after fasting for at least 8 hours.   Comment 1 Notify RN     Comment 2 Document in Chart    No results found.  Review of Systems  All other systems reviewed and are negative.   Blood pressure (!) 159/72, pulse (!) 59, temperature 97.8 F (36.6 C), temperature source Oral, resp. rate 20, height 5' 10.08" (1.78 m), weight 112.5 kg, SpO2 98 %. Physical Exam Constitutional:      Appearance: Normal appearance. He is obese.  Eyes:   Neurological:     Mental Status: He is alert.      Assessment/Plan 1. RD OS: PPV/retinectomy/EL/EC/SO OS  Corliss Parish, MD 12/25/2020, 1:24 PM

## 2020-12-25 NOTE — Anesthesia Procedure Notes (Signed)
Procedure Name: LMA Insertion Date/Time: 12/25/2020 1:43 PM Performed by: Hoy Morn, CRNA Pre-anesthesia Checklist: Patient identified, Emergency Drugs available, Suction available and Patient being monitored Patient Re-evaluated:Patient Re-evaluated prior to induction Oxygen Delivery Method: Circle system utilized Preoxygenation: Pre-oxygenation with 100% oxygen Induction Type: IV induction Ventilation: Mask ventilation without difficulty LMA: LMA inserted LMA Size: 5.0 Number of attempts: 1

## 2020-12-26 ENCOUNTER — Other Ambulatory Visit: Payer: Self-pay | Admitting: Ophthalmology

## 2020-12-26 ENCOUNTER — Encounter (HOSPITAL_COMMUNITY): Payer: Self-pay | Admitting: Ophthalmology

## 2020-12-26 NOTE — Anesthesia Postprocedure Evaluation (Signed)
Anesthesia Post Note  Patient: [name]  Procedure(s) Performed: SILICONE OIL REMOVAL (Left Eye) INJECTION OF SILICONE OIL (Left Eye) RETINECTOMY LEFT EYE (Left Eye) PARS PLANA VITRECTOMY WITH 25 GAUGE IN LEFT EYE (Left Eye) PERFLUORONE INJECTION (Left Eye) LASER PHOTO ABLATION (Left Eye)     Patient location during evaluation: PACU Anesthesia Type: General Level of consciousness: awake and alert Pain management: pain level controlled Vital Signs Assessment: post-procedure vital signs reviewed and stable Respiratory status: spontaneous breathing, nonlabored ventilation and respiratory function stable Cardiovascular status: blood pressure returned to baseline and stable Postop Assessment: no apparent nausea or vomiting Anesthetic complications: no   No complications documented.  Last Vitals:  Vitals:   12/25/20 1516 12/25/20 1531  BP: 135/68 (!) 124/91  Pulse: 62 61  Resp: 15 14  Temp:  36.8 C  SpO2: 96% 97%    Last Pain:  Vitals:   12/25/20 1531  TempSrc:   PainSc: 0-No pain   Pain Goal:                   Merlinda Frederick

## 2021-04-30 ENCOUNTER — Inpatient Hospital Stay (HOSPITAL_BASED_OUTPATIENT_CLINIC_OR_DEPARTMENT_OTHER)
Admission: EM | Admit: 2021-04-30 | Discharge: 2021-05-05 | DRG: 824 | Disposition: A | Discharge status: Home / Self Care | Payer: Medicare HMO | Attending: Internal Medicine | Admitting: Internal Medicine

## 2021-04-30 ENCOUNTER — Encounter (HOSPITAL_BASED_OUTPATIENT_CLINIC_OR_DEPARTMENT_OTHER): Payer: Self-pay | Admitting: Obstetrics and Gynecology

## 2021-04-30 ENCOUNTER — Emergency Department (HOSPITAL_BASED_OUTPATIENT_CLINIC_OR_DEPARTMENT_OTHER): Payer: Medicare HMO

## 2021-04-30 ENCOUNTER — Encounter: Payer: Self-pay | Admitting: Internal Medicine

## 2021-04-30 DIAGNOSIS — Z7902 Long term (current) use of antithrombotics/antiplatelets: Secondary | ICD-10-CM

## 2021-04-30 DIAGNOSIS — N1831 Chronic kidney disease, stage 3a: Secondary | ICD-10-CM | POA: Diagnosis present

## 2021-04-30 DIAGNOSIS — I2510 Atherosclerotic heart disease of native coronary artery without angina pectoris: Secondary | ICD-10-CM | POA: Diagnosis present

## 2021-04-30 DIAGNOSIS — C8333 Diffuse large B-cell lymphoma, intra-abdominal lymph nodes: Secondary | ICD-10-CM | POA: Diagnosis not present

## 2021-04-30 DIAGNOSIS — Z7984 Long term (current) use of oral hypoglycemic drugs: Secondary | ICD-10-CM | POA: Diagnosis not present

## 2021-04-30 DIAGNOSIS — N1330 Unspecified hydronephrosis: Secondary | ICD-10-CM | POA: Diagnosis present

## 2021-04-30 DIAGNOSIS — Z6835 Body mass index (BMI) 35.0-35.9, adult: Secondary | ICD-10-CM | POA: Diagnosis not present

## 2021-04-30 DIAGNOSIS — D6489 Other specified anemias: Secondary | ICD-10-CM | POA: Diagnosis not present

## 2021-04-30 DIAGNOSIS — Z7901 Long term (current) use of anticoagulants: Secondary | ICD-10-CM | POA: Diagnosis not present

## 2021-04-30 DIAGNOSIS — E1122 Type 2 diabetes mellitus with diabetic chronic kidney disease: Secondary | ICD-10-CM | POA: Diagnosis not present

## 2021-04-30 DIAGNOSIS — R1909 Other intra-abdominal and pelvic swelling, mass and lump: Secondary | ICD-10-CM | POA: Diagnosis present

## 2021-04-30 DIAGNOSIS — Z8249 Family history of ischemic heart disease and other diseases of the circulatory system: Secondary | ICD-10-CM | POA: Diagnosis not present

## 2021-04-30 DIAGNOSIS — Z794 Long term (current) use of insulin: Secondary | ICD-10-CM

## 2021-04-30 DIAGNOSIS — R1031 Right lower quadrant pain: Secondary | ICD-10-CM

## 2021-04-30 DIAGNOSIS — I480 Paroxysmal atrial fibrillation: Secondary | ICD-10-CM | POA: Diagnosis present

## 2021-04-30 DIAGNOSIS — Z79899 Other long term (current) drug therapy: Secondary | ICD-10-CM | POA: Diagnosis not present

## 2021-04-30 DIAGNOSIS — N189 Chronic kidney disease, unspecified: Secondary | ICD-10-CM | POA: Diagnosis present

## 2021-04-30 DIAGNOSIS — Z955 Presence of coronary angioplasty implant and graft: Secondary | ICD-10-CM

## 2021-04-30 DIAGNOSIS — N179 Acute kidney failure, unspecified: Secondary | ICD-10-CM | POA: Diagnosis not present

## 2021-04-30 DIAGNOSIS — Z20822 Contact with and (suspected) exposure to covid-19: Secondary | ICD-10-CM | POA: Diagnosis not present

## 2021-04-30 DIAGNOSIS — I10 Essential (primary) hypertension: Secondary | ICD-10-CM | POA: Diagnosis not present

## 2021-04-30 DIAGNOSIS — I152 Hypertension secondary to endocrine disorders: Secondary | ICD-10-CM | POA: Diagnosis present

## 2021-04-30 DIAGNOSIS — I129 Hypertensive chronic kidney disease with stage 1 through stage 4 chronic kidney disease, or unspecified chronic kidney disease: Secondary | ICD-10-CM | POA: Diagnosis present

## 2021-04-30 DIAGNOSIS — E119 Type 2 diabetes mellitus without complications: Secondary | ICD-10-CM

## 2021-04-30 DIAGNOSIS — N3289 Other specified disorders of bladder: Secondary | ICD-10-CM | POA: Diagnosis present

## 2021-04-30 DIAGNOSIS — R590 Localized enlarged lymph nodes: Secondary | ICD-10-CM | POA: Diagnosis present

## 2021-04-30 DIAGNOSIS — R591 Generalized enlarged lymph nodes: Secondary | ICD-10-CM | POA: Diagnosis present

## 2021-04-30 DIAGNOSIS — R911 Solitary pulmonary nodule: Secondary | ICD-10-CM | POA: Diagnosis present

## 2021-04-30 DIAGNOSIS — D72818 Other decreased white blood cell count: Secondary | ICD-10-CM | POA: Diagnosis not present

## 2021-04-30 DIAGNOSIS — E869 Volume depletion, unspecified: Secondary | ICD-10-CM | POA: Diagnosis present

## 2021-04-30 DIAGNOSIS — N182 Chronic kidney disease, stage 2 (mild): Secondary | ICD-10-CM | POA: Diagnosis not present

## 2021-04-30 LAB — COMPREHENSIVE METABOLIC PANEL
ALT: 12 U/L (ref 0–44)
AST: 10 U/L — ABNORMAL LOW (ref 15–41)
Albumin: 3.7 g/dL (ref 3.5–5.0)
Alkaline Phosphatase: 64 U/L (ref 38–126)
Anion gap: 9 (ref 5–15)
BUN: 33 mg/dL — ABNORMAL HIGH (ref 8–23)
CO2: 23 mmol/L (ref 22–32)
Calcium: 8.8 mg/dL — ABNORMAL LOW (ref 8.9–10.3)
Chloride: 108 mmol/L (ref 98–111)
Creatinine, Ser: 2.4 mg/dL — ABNORMAL HIGH (ref 0.61–1.24)
GFR, Estimated: 28 mL/min — ABNORMAL LOW (ref >60)
Glucose, Bld: 157 mg/dL — ABNORMAL HIGH (ref 70–99)
Potassium: 4.6 mmol/L (ref 3.5–5.1)
Sodium: 140 mmol/L (ref 135–145)
Total Bilirubin: 0.6 mg/dL (ref 0.3–1.2)
Total Protein: 5.9 g/dL — ABNORMAL LOW (ref 6.5–8.1)

## 2021-04-30 LAB — CBC
HCT: 30.8 % — ABNORMAL LOW (ref 39.0–52.0)
Hemoglobin: 9.7 g/dL — ABNORMAL LOW (ref 13.0–17.0)
MCH: 25.5 pg — ABNORMAL LOW (ref 26.0–34.0)
MCHC: 31.5 g/dL (ref 30.0–36.0)
MCV: 80.8 fL (ref 80.0–100.0)
Platelets: 162 10*3/uL (ref 150–400)
RBC: 3.81 MIL/uL — ABNORMAL LOW (ref 4.22–5.81)
RDW: 15.9 % — ABNORMAL HIGH (ref 11.5–15.5)
WBC: 2.6 10*3/uL — ABNORMAL LOW (ref 4.0–10.5)
nRBC: 0 % (ref 0.0–0.2)

## 2021-04-30 LAB — URINALYSIS, ROUTINE W REFLEX MICROSCOPIC
Bilirubin Urine: NEGATIVE
Glucose, UA: NEGATIVE mg/dL
Ketones, ur: NEGATIVE mg/dL
Leukocytes,Ua: NEGATIVE
Nitrite: NEGATIVE
Specific Gravity, Urine: 1.017 (ref 1.005–1.030)
pH: 5.5 (ref 5.0–8.0)

## 2021-04-30 LAB — RESP PANEL BY RT-PCR (FLU A&B, COVID) ARPGX2
Influenza A by PCR: NEGATIVE
Influenza B by PCR: NEGATIVE
SARS Coronavirus 2 by RT PCR: NEGATIVE

## 2021-04-30 LAB — LIPASE, BLOOD: Lipase: 13 U/L (ref 11–51)

## 2021-04-30 LAB — CBG MONITORING, ED: Glucose-Capillary: 94 mg/dL (ref 70–99)

## 2021-04-30 MED ORDER — METOPROLOL TARTRATE 25 MG PO TABS
25.0000 mg | ORAL_TABLET | Freq: Three times a day (TID) | ORAL | Status: DC
  Administered 2021-05-01 – 2021-05-03 (×8): 25 mg via ORAL
  Filled 2021-04-30 (×9): qty 1

## 2021-04-30 MED ORDER — ISOSORBIDE MONONITRATE ER 30 MG PO TB24
30.0000 mg | ORAL_TABLET | Freq: Every day | ORAL | Status: DC
  Administered 2021-05-01 – 2021-05-02 (×2): 30 mg via ORAL
  Filled 2021-04-30 (×2): qty 1

## 2021-04-30 MED ORDER — PREDNISOLONE ACETATE 1 % OP SUSP
1.0000 [drp] | Freq: Two times a day (BID) | OPHTHALMIC | Status: DC
  Administered 2021-05-01 – 2021-05-05 (×9): 1 [drp] via OPHTHALMIC
  Filled 2021-04-30: qty 5

## 2021-04-30 MED ORDER — TERAZOSIN HCL 2 MG PO CAPS
2.0000 mg | ORAL_CAPSULE | Freq: Every day | ORAL | Status: DC
  Administered 2021-05-01 – 2021-05-04 (×5): 2 mg via ORAL
  Filled 2021-04-30 (×5): qty 1

## 2021-04-30 MED ORDER — INSULIN ASPART 100 UNIT/ML IJ SOLN
0.0000 [IU] | INTRAMUSCULAR | Status: DC
  Administered 2021-05-01: 1 [IU] via SUBCUTANEOUS
  Administered 2021-05-01: 7 [IU] via SUBCUTANEOUS
  Administered 2021-05-01: 5 [IU] via SUBCUTANEOUS
  Administered 2021-05-02: 3 [IU] via SUBCUTANEOUS
  Administered 2021-05-02: 5 [IU] via SUBCUTANEOUS
  Administered 2021-05-02 – 2021-05-03 (×5): 3 [IU] via SUBCUTANEOUS
  Administered 2021-05-03: 5 [IU] via SUBCUTANEOUS
  Administered 2021-05-04 (×3): 2 [IU] via SUBCUTANEOUS
  Administered 2021-05-04: 3 [IU] via SUBCUTANEOUS
  Administered 2021-05-04: 2 [IU] via SUBCUTANEOUS
  Administered 2021-05-04: 9 [IU] via SUBCUTANEOUS
  Administered 2021-05-05 (×3): 2 [IU] via SUBCUTANEOUS
  Administered 2021-05-05: 5 [IU] via SUBCUTANEOUS

## 2021-04-30 MED ORDER — ATORVASTATIN CALCIUM 10 MG PO TABS
20.0000 mg | ORAL_TABLET | Freq: Every evening | ORAL | Status: DC
  Administered 2021-05-01 – 2021-05-04 (×4): 20 mg via ORAL
  Filled 2021-04-30 (×4): qty 2

## 2021-04-30 MED ORDER — AMIODARONE HCL 200 MG PO TABS
200.0000 mg | ORAL_TABLET | Freq: Every day | ORAL | Status: DC
  Administered 2021-05-01 – 2021-05-05 (×5): 200 mg via ORAL
  Filled 2021-04-30 (×5): qty 1

## 2021-04-30 MED ORDER — INSULIN GLARGINE 100 UNIT/ML ~~LOC~~ SOLN
17.0000 [IU] | Freq: Every day | SUBCUTANEOUS | Status: DC
  Filled 2021-04-30: qty 0.17

## 2021-04-30 MED ORDER — LACTATED RINGERS IV BOLUS
1000.0000 mL | Freq: Once | INTRAVENOUS | Status: AC
  Administered 2021-04-30: 1000 mL via INTRAVENOUS

## 2021-04-30 NOTE — ED Triage Notes (Signed)
Patient reports to the ER via EMS for RLQ pain in abdomen. Patient reports the pain is disrupting his sleep. Patient able to ambulate on scene. Patient has a hx of diabetes, HTN  CBG wth EMS 173 BP 160/70 HR 60 Spo2 98% on RA

## 2021-04-30 NOTE — Progress Notes (Signed)
Report received from RN Suzanna from [facility].  Pt has not arrived.  Report passed onto [employer].

## 2021-04-30 NOTE — ED Notes (Signed)
[facility] advised otw to transport patient to [facility] Rm# [id number]

## 2021-04-30 NOTE — ED Notes (Signed)
Called bed ready with [facility] at [facility]

## 2021-04-30 NOTE — ED Provider Notes (Signed)
[facility] EMERGENCY DEPT Provider Note   CSN: [id number] Arrival date & time: 04/30/21  1236     History Chief Complaint  Patient presents with   Abdominal Pain    [name] is a 75 y.o. male.  75yo M w/ PMH including CAD, T2DM, OSA who p/w R sided abdominal pain. 12 days ago, [name] began having sudden intermittent pain in RLQ. Pain has been interfering with his sleep. [name] reports normal eating and drinking. No nausea/vomiting, constipation/diarrhea, urinary symptoms, or fevers. No medications tried PTA. No hx of kidney stones.  The history is provided by the patient. The history is limited by a language barrier. A language interpreter was used.  Abdominal Pain     Past Medical History:  Diagnosis Date   Anginal pain ([place])    Coronary artery disease    Diabetes mellitus without complication ([place])    Dysrhythmia    Hypertension    Sleep apnea     There are no problems to display for this patient.   Past Surgical History:  Procedure Laterality Date   CARDIAC CATHETERIZATION     EYE SURGERY     INJECTION OF SILICONE OIL Left 28/41/3244   Procedure: INJECTION OF SILICONE OIL;  Surgeon: Sherlynn Stalls, MD;  Location: [facility];  Service: Ophthalmology;  Laterality: Left;   INJECTION OF SILICONE OIL Left 0/08/2724   Procedure: INJECTION OF SILICONE OIL;  Surgeon: Sherlynn Stalls, MD;  Location: [facility];  Service: Ophthalmology;  Laterality: Left;   JOINT REPLACEMENT Right    hip   LASER PHOTO ABLATION Left 12/25/2020   Procedure: LASER PHOTO ABLATION;  Surgeon: Sherlynn Stalls, MD;  Location: [facility];  Service: Ophthalmology;  Laterality: Left;   PARS PLANA VITRECTOMY Left 11/10/2020   Procedure: PARS PLANA VITRECTOMY WITH 25 GAUGE;  Surgeon: Sherlynn Stalls, MD;  Location: [facility];  Service: Ophthalmology;  Laterality: Left;   PARS PLANA VITRECTOMY Left 12/25/2020   Procedure: PARS PLANA VITRECTOMY WITH 25 GAUGE IN LEFT EYE;  Surgeon: Sherlynn Stalls, MD;  Location:  [facility];  Service: Ophthalmology;  Laterality: Left;   PERFLUORONE INJECTION Left 12/25/2020   Procedure: PERFLUORONE INJECTION;  Surgeon: Sherlynn Stalls, MD;  Location: [facility];  Service: Ophthalmology;  Laterality: Left;   PHOTOCOAGULATION WITH LASER Left 11/10/2020   Procedure: PHOTOCOAGULATION WITH LASER;  Surgeon: Sherlynn Stalls, MD;  Location: [facility];  Service: Ophthalmology;  Laterality: Left;   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  Social History   Tobacco Use   Smoking status: Never   Smokeless tobacco: Never  Vaping Use   Vaping Use: Never used  Substance Use Topics   Alcohol use: Not Currently   Drug use: Never    Home Medications Prior to Admission medications   Medication Sig Start Date End Date Taking? Authorizing Provider  amiodarone (PACERONE) 400 MG tablet Take 400 mg by mouth daily. 11/25/20   [provider]  Ascorbic Acid (VITAMIN C PO) Take 1 tablet by mouth every other day.    [provider]  atorvastatin (LIPITOR) 20 MG tablet Take 20 mg by mouth every evening. 11/04/20  [provider]  Cholecalciferol (VITAMIN D3 PO) Take 1 tablet by mouth every other day.    [provider]  clopidogrel (PLAVIX) 75 MG tablet Take 75 mg by mouth daily. 12/17/20   [provider]  ELIQUIS 5 MG TABS tablet Take 5 mg by mouth 2 (two) times daily. 11/05/20   [provider]  glipiZIDE (GLUCOTROL) 10 MG tablet Take 10 mg by mouth 2 (two) times daily. 10/03/20   [provider]  hydrochlorothiazide (HYDRODIURIL) 25 MG tablet Take 25 mg by mouth daily. 10/07/20   [provider]  isosorbide mononitrate (IMDUR) 30 MG 24 hr tablet Take 30 mg by mouth daily. 09/08/20   [provider]  LANTUS SOLOSTAR 100 UNIT/ML Solostar Pen Inject 36 Units into the skin every evening. 11/05/20   [provider]  lisinopril (ZESTRIL) 20 MG tablet Take 20 mg by mouth daily. 08/29/20   [provider]  metFORMIN (GLUCOPHAGE) 1000 MG tablet Take 1,000 mg by mouth in the morning and at bedtime.    [provider]  metoprolol tartrate (LOPRESSOR) 25 MG tablet Take 25 mg by mouth 3 (three) times daily. (Morning, noon, evening) 10/23/20   [provider]  Multiple Vitamins-Minerals (ZINC PO) Take 1 tablet by mouth every other day.    [provider]  nitroGLYCERIN (NITROSTAT) 0.3 MG SL tablet Place 0.3 mg under the tongue every 5 (five) minutes x 3 doses as needed for chest pain. 09/08/20   [provider]  prednisoLONE acetate (PRED FORTE) 1 % ophthalmic suspension Place 1 drop into the left eye in the morning, at noon, and at bedtime. 11/25/20   [provider]  terazosin (HYTRIN) 2 MG capsule Take 2 mg by mouth at bedtime. 10/03/20   [provider]    Allergies    Patient has no known allergies.  Review of Systems   Review of Systems  Gastrointestinal:  Positive for abdominal pain.  All other systems reviewed and are negative except that which was mentioned in HPI  Physical Exam Updated Vital Signs BP (!) 154/75 (BP Location: Left Arm)   Pulse (!) 58   Temp 98.9 F (37.2 C)   Resp 16   Ht 5\' 10"  (1.778 m)   Wt 113.4 kg   SpO2 99%   BMI 35.87 kg/m   Physical Exam Vitals and nursing note reviewed.  Constitutional:      General: [name] is not in acute distress.    Appearance: Normal appearance.  HENT:     Head: Normocephalic and atraumatic.     Mouth/Throat:     Mouth: Mucous membranes are moist.  Eyes:     Conjunctiva/sclera: Conjunctivae normal.  Cardiovascular:     Rate and  Rhythm: Normal rate and regular rhythm.     Heart sounds: Normal heart sounds. No murmur heard. Pulmonary:     Effort: Pulmonary effort is normal.     Breath sounds: Normal breath sounds.  Abdominal:     General: Abdomen is flat. Bowel sounds are normal. There is no distension.     Palpations: Abdomen is soft.     Tenderness: There is abdominal tenderness in the right lower quadrant. There is no guarding or rebound. Negative signs include Rovsing's sign.  Musculoskeletal:     Right lower leg: Edema present.     Left lower leg: Edema present.  Skin:    General: Skin is warm and dry.  Neurological:     Mental Status: [name]  is alert and oriented to person, place, and time.     Comments: fluent  Psychiatric:        Mood and Affect: Mood normal.        Behavior: Behavior normal.    ED Results / Procedures / Treatments   Labs (all labs ordered are listed, but only abnormal results are displayed) Labs Reviewed  COMPREHENSIVE METABOLIC PANEL - Abnormal; Notable for the following components:      Result Value   Glucose, Bld 157 (*)    BUN 33 (*)    Creatinine, Ser 2.40 (*)    Calcium 8.8 (*)    Total Protein 5.9 (*)    AST 10 (*)    GFR, Estimated 28 (*)    All other components within normal limits  CBC - Abnormal; Notable for the following components:   WBC 2.6 (*)    RBC 3.81 (*)    Hemoglobin 9.7 (*)    HCT 30.8 (*)    MCH 25.5 (*)    RDW 15.9 (*)    All other components within normal limits  URINALYSIS, ROUTINE W REFLEX MICROSCOPIC - Abnormal; Notable for the following components:   Hgb urine dipstick LARGE (*)    Protein, ur TRACE (*)    All other components within normal limits  RESP PANEL BY RT-PCR (FLU A&B, COVID) ARPGX2  LIPASE, BLOOD    EKG None  Radiology CT Abdomen Pelvis Wo Contrast  Result Date: 04/30/2021 CLINICAL DATA:  Right lower quadrant abdominal pain EXAM: CT ABDOMEN AND PELVIS WITHOUT CONTRAST TECHNIQUE: Multidetector CT imaging of the abdomen and  pelvis was performed following the standard protocol without IV contrast. COMPARISON:  None. FINDINGS: Lower chest: 6 mm mean diameter noncalcified pulmonary nodule is seen within the visualized right middle lobe, indeterminate. Scattered ground-glass pulmonary infiltrates in the noted at the lung bases bilaterally with subtle, smooth interlobular septal thickening possibly representing changes of trace pulmonary edema, possibly cardiogenic in nature. Extensive multi-vessel coronary artery calcification is present with probable stenting of the distal right coronary artery. Global cardiac size within normal limits. No pericardial effusion. Hypoattenuation of the cardiac blood pool is in keeping with at least mild anemia. Hepatobiliary: No focal liver abnormality is seen. No gallstones, gallbladder wall thickening, or biliary dilatation. Pancreas: Unremarkable Spleen: There is moderate splenomegaly with the spleen measuring 17.2 cm in greatest dimension. No definite intrasplenic lesion identified on this noncontrast examination. Adrenals/Urinary Tract: The adrenal glands are unremarkable. The kidneys are normal in size and position. There is mild right hydronephrosis secondary to an obstructing lobulated mass in the region of the right ureteropelvic junction measuring 3.1 x 4.5 x 3.8 cm on axial image # 58 and coronal image # 67 while difficult to accurately characterize in the absence of contrast administration, the mass appears external to the right ureter and demonstrates occlusion of the a proximal right ureter likely related to extrinsic mass effect, best appreciated on coronal imaging. Mild bilateral nonspecific perinephric stranding. No intrarenal or ureteral calculi are identified no hydronephrosis on the left. Appearing calcifications along the posterior wall of the bladder in the region of the ureteropelvic junction may represent posterior layering calculi within the bladder lumen or may be artifactual and  related to streak artifact from right total hip arthroplasty. Stomach/Bowel: The stomach, small bowel, and large bowel are unremarkable. No evidence of obstruction or focal inflammation. The appendix is normal. No free intraperitoneal gas or fluid. Vascular/Lymphatic: There is extensive mesenteric infiltration. Additionally, there is  pathologic mesenteric adenopathy with the dominant mass measuring 6.7 x 4.1 cm at axial image # 44/2. Multiple additional pathologically enlarged mesenteric lymph nodes are identified demonstrating irregular margins. There is, additionally, pathologic bilateral common iliac and external iliac lymphadenopathy as well as right pelvic sidewall lymphadenopathy with the index right common iliac lymph node measuring 1.8 x 3.7 cm at axial image # 72/2. Borderline pathologic aortocaval and left periaortic lymphadenopathy is present. There is moderate aortoiliac atherosclerotic calcification. No aortic aneurysm. Reproductive: Mild prostatic enlargement. Seminal vesicles are unremarkable. Other: Soft tissue nodule within the right retroperitoneum adjacent to the right psoas muscle may represent pathologic retroperitoneal adenopathy or a metastatic implant. This measures 1.7 x 3.0 cm at axial image # 63/2. 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 Degenerative changes are seen within the lumbar spine. No lytic or blastic bone lesions are identified. IMPRESSION: Pathologic mesenteric, retroperitoneal, and pelvic adenopathy, as described above. Moderate splenomegaly. Lobulated retroperitoneal soft tissue masses in the region of the right ureteropelvic junction likely resulting in mild right hydronephrosis secondary to extrinsic mass effect as well as within the right retroperitoneum. 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 Less likely, this may represent metastatic disease secondary to a primary urothelial malignancy or potentially metastatic disease  related to underlying prostate cancer, however, the nodal distribution would be unusual for the former and the lack of osseous metastatic disease given the extensive nodal involvement would be unusual for the latter. PET CT examination may be more helpful for further evaluation as well as identification of a a optimal tissue sampling target. Pathologic right external iliac lymphadenopathy may be amenable to ultrasound-guided sampling for definitive diagnosis. 6 mm of right middle lobe pulmonary nodule. Non-contrast chest CT at 6-12 months is recommended. If the nodule is stable at time of repeat CT, then future CT at 18-24 months (from today's scan) is considered optional for low-risk patients, but is recommended for high-risk patients. This recommendation follows the consensus statement: Guidelines for Management of Incidental Pulmonary Nodules Detected on CT Images: From the Fleischner Society 2017; Radiology 2017; 284:228-243. Extensive coronary artery calcification. Scattered ground-glass pulmonary infiltrates and subtle smooth interlobular septal thickening may reflect changes of mild cardiogenic failure. At least mild anemia. Aortic Atherosclerosis (ICD10-I70.0). Electronically Signed   By: Fidela Salisbury MD   On: 04/30/2021 14:38    Procedures Procedures   Medications Ordered in ED Medications  lactated ringers bolus 1,000 mL (0 mLs Intravenous Stopped 04/30/21 1509)    ED Course  I have reviewed the triage vital signs and the nursing notes.  Pertinent labs & imaging results that were available during my care of the patient were reviewed by me and considered in my medical decision making (see chart for details).    MDM Rules/Calculators/A&P                          PT alert, non-toxic on exam, tender in RLQ. Labs notable for AKI w/ Cr 2.4, UA w/ +blood. Gave 1L IVF bolus. Obtained CT to eval for kidney stone. CT shows multiple pathologic lymph nodes and soft tissue masses at UPJ w/ mild R  hydro. Concern for malignancy--lymphoma vs urothelial malignancy w/ mets. Discussed w/ urology, Dr. [name], who recommended hydration to see how kidney function responds. If creatinine doesn't improve, they may consider a stent.   Discussed w/ daughter over the phone as well as with patient and his wife via interpreter. Recommended admission at  [facility] for work up. Discussed w/ [facility],  Dr. Starla Link. Pt will be admitted at [facility].  Final Clinical Impression(s) / ED Diagnoses Final diagnoses:  Acute renal failure, unspecified acute renal failure type ([place])  Lymphadenopathy, abdominal  RLQ abdominal pain    Rx / DC Orders ED Discharge Orders     None        Ananda Caya, Wenda Overland, MD 04/30/21 1529

## 2021-05-01 ENCOUNTER — Other Ambulatory Visit: Payer: Self-pay

## 2021-05-01 ENCOUNTER — Encounter (HOSPITAL_COMMUNITY): Payer: Self-pay | Admitting: Internal Medicine

## 2021-05-01 ENCOUNTER — Inpatient Hospital Stay (HOSPITAL_COMMUNITY): Payer: Medicare HMO | Admitting: Certified Registered"

## 2021-05-01 ENCOUNTER — Encounter (HOSPITAL_COMMUNITY)
Admission: EM | Disposition: A | Discharge status: Home / Self Care | Payer: Self-pay | Source: Home / Self Care | Attending: Internal Medicine

## 2021-05-01 ENCOUNTER — Inpatient Hospital Stay (HOSPITAL_COMMUNITY): Payer: Medicare HMO

## 2021-05-01 DIAGNOSIS — N179 Acute kidney failure, unspecified: Secondary | ICD-10-CM | POA: Diagnosis not present

## 2021-05-01 DIAGNOSIS — R591 Generalized enlarged lymph nodes: Secondary | ICD-10-CM

## 2021-05-01 DIAGNOSIS — R590 Localized enlarged lymph nodes: Secondary | ICD-10-CM

## 2021-05-01 DIAGNOSIS — Z7901 Long term (current) use of anticoagulants: Secondary | ICD-10-CM | POA: Diagnosis not present

## 2021-05-01 DIAGNOSIS — E1122 Type 2 diabetes mellitus with diabetic chronic kidney disease: Secondary | ICD-10-CM

## 2021-05-01 DIAGNOSIS — I480 Paroxysmal atrial fibrillation: Secondary | ICD-10-CM

## 2021-05-01 DIAGNOSIS — E119 Type 2 diabetes mellitus without complications: Secondary | ICD-10-CM

## 2021-05-01 DIAGNOSIS — N1330 Unspecified hydronephrosis: Secondary | ICD-10-CM

## 2021-05-01 DIAGNOSIS — R1031 Right lower quadrant pain: Secondary | ICD-10-CM

## 2021-05-01 DIAGNOSIS — N182 Chronic kidney disease, stage 2 (mild): Secondary | ICD-10-CM

## 2021-05-01 DIAGNOSIS — Z79899 Other long term (current) drug therapy: Secondary | ICD-10-CM

## 2021-05-01 DIAGNOSIS — I2510 Atherosclerotic heart disease of native coronary artery without angina pectoris: Secondary | ICD-10-CM | POA: Diagnosis not present

## 2021-05-01 DIAGNOSIS — Z794 Long term (current) use of insulin: Secondary | ICD-10-CM

## 2021-05-01 DIAGNOSIS — I10 Essential (primary) hypertension: Secondary | ICD-10-CM

## 2021-05-01 DIAGNOSIS — E1159 Type 2 diabetes mellitus with other circulatory complications: Secondary | ICD-10-CM | POA: Diagnosis present

## 2021-05-01 HISTORY — PX: CYSTOSCOPY WITH RETROGRADE PYELOGRAM, URETEROSCOPY AND STENT PLACEMENT: SHX5789

## 2021-05-01 LAB — CBC
HCT: 30.4 % — ABNORMAL LOW (ref 39.0–52.0)
Hemoglobin: 9.7 g/dL — ABNORMAL LOW (ref 13.0–17.0)
MCH: 25.9 pg — ABNORMAL LOW (ref 26.0–34.0)
MCHC: 31.9 g/dL (ref 30.0–36.0)
MCV: 81.3 fL (ref 80.0–100.0)
Platelets: 164 10*3/uL (ref 150–400)
RBC: 3.74 MIL/uL — ABNORMAL LOW (ref 4.22–5.81)
RDW: 15.7 % — ABNORMAL HIGH (ref 11.5–15.5)
WBC: 2 10*3/uL — ABNORMAL LOW (ref 4.0–10.5)
nRBC: 0 % (ref 0.0–0.2)

## 2021-05-01 LAB — BASIC METABOLIC PANEL
Anion gap: 9 (ref 5–15)
BUN: 31 mg/dL — ABNORMAL HIGH (ref 8–23)
CO2: 21 mmol/L — ABNORMAL LOW (ref 22–32)
Calcium: 8.7 mg/dL — ABNORMAL LOW (ref 8.9–10.3)
Chloride: 109 mmol/L (ref 98–111)
Creatinine, Ser: 2.45 mg/dL — ABNORMAL HIGH (ref 0.61–1.24)
GFR, Estimated: 27 mL/min — ABNORMAL LOW (ref >60)
Glucose, Bld: 123 mg/dL — ABNORMAL HIGH (ref 70–99)
Potassium: 4.4 mmol/L (ref 3.5–5.1)
Sodium: 139 mmol/L (ref 135–145)

## 2021-05-01 LAB — GLUCOSE, CAPILLARY
Glucose-Capillary: 104 mg/dL — ABNORMAL HIGH (ref 70–99)
Glucose-Capillary: 120 mg/dL — ABNORMAL HIGH (ref 70–99)
Glucose-Capillary: 127 mg/dL — ABNORMAL HIGH (ref 70–99)
Glucose-Capillary: 130 mg/dL — ABNORMAL HIGH (ref 70–99)
Glucose-Capillary: 139 mg/dL — ABNORMAL HIGH (ref 70–99)
Glucose-Capillary: 267 mg/dL — ABNORMAL HIGH (ref 70–99)
Glucose-Capillary: 296 mg/dL — ABNORMAL HIGH (ref 70–99)
Glucose-Capillary: 300 mg/dL — ABNORMAL HIGH (ref 70–99)

## 2021-05-01 LAB — HEMOGLOBIN A1C
Hgb A1c MFr Bld: 7.7 % — ABNORMAL HIGH (ref 4.8–5.6)
Mean Plasma Glucose: 174 mg/dL

## 2021-05-01 SURGERY — CYSTOURETEROSCOPY, WITH RETROGRADE PYELOGRAM AND STENT INSERTION
Anesthesia: General | Site: Ureter | Laterality: Right

## 2021-05-01 MED ORDER — ACETAMINOPHEN 650 MG RE SUPP: 650.0000 mg | Freq: Four times a day (QID) | RECTAL | Status: DC | PRN

## 2021-05-01 MED ORDER — LACTATED RINGERS IV SOLN: INTRAVENOUS | Status: DC

## 2021-05-01 MED ORDER — ACETAMINOPHEN 325 MG PO TABS: 650.0000 mg | ORAL_TABLET | Freq: Four times a day (QID) | ORAL | Status: DC | PRN

## 2021-05-01 MED ORDER — DEXAMETHASONE SODIUM PHOSPHATE 10 MG/ML IJ SOLN
INTRAMUSCULAR | Status: AC
  Filled 2021-05-01: qty 1

## 2021-05-01 MED ORDER — CEFAZOLIN SODIUM-DEXTROSE 2-3 GM-%(50ML) IV SOLR
INTRAVENOUS | Status: DC | PRN
  Administered 2021-05-01: 2 g via INTRAVENOUS

## 2021-05-01 MED ORDER — ONDANSETRON HCL 4 MG/2ML IJ SOLN
INTRAMUSCULAR | Status: DC | PRN
  Administered 2021-05-01: 4 mg via INTRAVENOUS

## 2021-05-01 MED ORDER — LIDOCAINE 2% (20 MG/ML) 5 ML SYRINGE
INTRAMUSCULAR | Status: AC
  Filled 2021-05-01: qty 5

## 2021-05-01 MED ORDER — PROPOFOL 10 MG/ML IV BOLUS
INTRAVENOUS | Status: DC | PRN
  Administered 2021-05-01: 150 mg via INTRAVENOUS

## 2021-05-01 MED ORDER — ONDANSETRON HCL 4 MG/2ML IJ SOLN
INTRAMUSCULAR | Status: AC
  Filled 2021-05-01: qty 2

## 2021-05-01 MED ORDER — ONDANSETRON HCL 4 MG/2ML IJ SOLN: 4.0000 mg | Freq: Once | INTRAMUSCULAR | Status: DC | PRN

## 2021-05-01 MED ORDER — FENTANYL CITRATE (PF) 100 MCG/2ML IJ SOLN
INTRAMUSCULAR | Status: DC | PRN
  Administered 2021-05-01: 50 ug via INTRAVENOUS

## 2021-05-01 MED ORDER — CEFAZOLIN SODIUM-DEXTROSE 2-4 GM/100ML-% IV SOLN
INTRAVENOUS | Status: AC
  Filled 2021-05-01: qty 100

## 2021-05-01 MED ORDER — LIDOCAINE 2% (20 MG/ML) 5 ML SYRINGE
INTRAMUSCULAR | Status: DC | PRN
  Administered 2021-05-01: 40 mg via INTRAVENOUS

## 2021-05-01 MED ORDER — FENTANYL CITRATE (PF) 100 MCG/2ML IJ SOLN
INTRAMUSCULAR | Status: AC
  Filled 2021-05-01: qty 2

## 2021-05-01 MED ORDER — IOHEXOL 300 MG/ML  SOLN
INTRAMUSCULAR | Status: DC | PRN
  Administered 2021-05-01: 10 mL via URETHRAL

## 2021-05-01 MED ORDER — DEXAMETHASONE SODIUM PHOSPHATE 10 MG/ML IJ SOLN
INTRAMUSCULAR | Status: DC | PRN
  Administered 2021-05-01: 4 mg via INTRAVENOUS

## 2021-05-01 MED ORDER — FENTANYL CITRATE (PF) 100 MCG/2ML IJ SOLN: 25.0000 ug | INTRAMUSCULAR | Status: DC | PRN

## 2021-05-01 MED ORDER — SODIUM CHLORIDE 0.9 % IR SOLN
Status: DC | PRN
  Administered 2021-05-01: 3000 mL

## 2021-05-01 MED ORDER — ACETAMINOPHEN 10 MG/ML IV SOLN: 1000.0000 mg | Freq: Once | INTRAVENOUS | Status: DC | PRN

## 2021-05-01 MED ORDER — PHENYLEPHRINE HCL 2.5 % OP SOLN: 1.0000 [drp] | OPHTHALMIC | Status: DC | PRN

## 2021-05-01 MED ORDER — MORPHINE SULFATE (PF) 2 MG/ML IV SOLN: 2.0000 mg | INTRAVENOUS | Status: DC | PRN

## 2021-05-01 MED ORDER — ATROPINE SULFATE 1 % OP SOLN: 1.0000 [drp] | OPHTHALMIC | Status: DC | PRN

## 2021-05-01 MED ORDER — ONDANSETRON HCL 4 MG PO TABS: 4.0000 mg | ORAL_TABLET | Freq: Four times a day (QID) | ORAL | Status: DC | PRN

## 2021-05-01 MED ORDER — PROPOFOL 10 MG/ML IV BOLUS
INTRAVENOUS | Status: AC
  Filled 2021-05-01: qty 20

## 2021-05-01 MED ORDER — ONDANSETRON HCL 4 MG/2ML IJ SOLN: 4.0000 mg | Freq: Four times a day (QID) | INTRAMUSCULAR | Status: DC | PRN

## 2021-05-01 SURGICAL SUPPLY — 24 items
BAG DRN RND TRDRP ANRFLXCHMBR (UROLOGICAL SUPPLIES) ×1
BAG URINE DRAIN 2000ML AR STRL (UROLOGICAL SUPPLIES) ×2 IMPLANT
BAG URO CATCHER STRL LF (MISCELLANEOUS) ×2 IMPLANT
BASKET ZERO TIP NITINOL 2.4FR (BASKET) IMPLANT
BSKT STON RTRVL ZERO TP 2.4FR (BASKET)
CATH INTERMIT  6FR 70CM (CATHETERS) ×2 IMPLANT
CATH TIEMANN FOLEY 18FR 5CC (CATHETERS) ×2 IMPLANT
CATH URET 5FR 28IN CONE TIP (BALLOONS)
CATH URET 5FR 70CM CONE TIP (BALLOONS) IMPLANT
CLOTH BEACON ORANGE TIMEOUT ST (SAFETY) ×2 IMPLANT
GLOVE SURG ENC MOIS LTX SZ7.5 (GLOVE) ×2 IMPLANT
GOWN STRL REUS W/TWL XL LVL3 (GOWN DISPOSABLE) ×2 IMPLANT
GUIDEWIRE STR DUAL SENSOR (WIRE) ×2 IMPLANT
KIT TURNOVER KIT A (KITS) ×2 IMPLANT
LASER FIB FLEXIVA PULSE ID 365 (Laser) IMPLANT
MANIFOLD NEPTUNE II (INSTRUMENTS) ×2 IMPLANT
PACK CYSTO (CUSTOM PROCEDURE TRAY) ×2 IMPLANT
SHEATH URETERAL 12FRX28CM (UROLOGICAL SUPPLIES) IMPLANT
SHEATH URETERAL 12FRX35CM (MISCELLANEOUS) IMPLANT
STENT CONTOUR 6FRX26X.038 (STENTS) ×2 IMPLANT
TRACTIP FLEXIVA PULS ID 200XHI (Laser) IMPLANT
TRACTIP FLEXIVA PULSE ID 200 (Laser)
TUBING CONNECTING 10 (TUBING) ×2 IMPLANT
TUBING UROLOGY SET (TUBING) ×2 IMPLANT

## 2021-05-01 NOTE — Consult Note (Signed)
Urology Consult   Physician requesting consult: Dr. Alcario Drought  Reason for consult: Right hydronephrosis with acute kidney injury  History of Present Illness: [name] is a 75 y.o. [place] male who is transferred from the [facility] emergency room for evaluation of hydronephrosis and abdominal pain.  The patient speaks no English the history was obtained utilizing his daughter over the telephone who speaks fluent [place].  Patient essentially has been experiencing some right lower quadrant pain over the last several days which became worse earlier today.  He was seen in the emergency room was found to have acute kidney injury and creatinine of 2.4 with GFR of 24.  Subsequent underwent noncontrasted CT scan which was reviewed and shows what appears to be lymphadenopathy in the retroperitoneum and a mass which is near the UPJ area on the right with some associated hydronephrosis.  There is no evidence of left-sided hydronephrosis.  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  There is significant mesenteric adenopathy and adenopathy.  Along the iliac chain as well.  See report below.  The patient has had no flank pain all pain has been in the right lower quadrant area.  No evidence of fever or gross hematuria.  Complicating the issue is he has had recent cardiac stent approximately 5 months ago and is on dual anticoagulant with Eliquis and Plavix.  He denies a history of voiding or storage urinary symptoms, hematuria, UTIs, STDs, urolithiasis, GU malignancy/trauma/surgery.  Past Medical History:  Diagnosis Date   Anginal pain ([place])    Coronary artery disease    Diabetes mellitus without complication ([place])    Dysrhythmia    Hypertension    Sleep apnea     Past Surgical History:  Procedure Laterality Date   CARDIAC CATHETERIZATION     EYE SURGERY     INJECTION OF SILICONE OIL Left 78/93/8101   Procedure: INJECTION OF SILICONE  OIL;  Surgeon: Sherlynn Stalls, MD;  Location: [facility];  Service: Ophthalmology;  Laterality: Left;   INJECTION OF SILICONE OIL Left 7/51/0258   Procedure: INJECTION OF SILICONE OIL;  Surgeon: Sherlynn Stalls, MD;  Location: [facility];  Service: Ophthalmology;  Laterality: Left;   JOINT REPLACEMENT Right    hip   LASER PHOTO ABLATION Left 12/25/2020   Procedure: LASER PHOTO ABLATION;  Surgeon: Sherlynn Stalls, MD;  Location: [facility];  Service: Ophthalmology;  Laterality: Left;   PARS PLANA VITRECTOMY Left 11/10/2020   Procedure: PARS PLANA VITRECTOMY WITH 25 GAUGE;  Surgeon: Sherlynn Stalls, MD;  Location: [facility];  Service: Ophthalmology;  Laterality: Left;   PARS PLANA VITRECTOMY Left 12/25/2020   Procedure: PARS PLANA VITRECTOMY WITH 25 GAUGE IN LEFT EYE;  Surgeon: Sherlynn Stalls, MD;  Location: [facility];  Service: Ophthalmology;  Laterality: Left;   PERFLUORONE INJECTION Left 12/25/2020   Procedure: PERFLUORONE INJECTION;  Surgeon: Sherlynn Stalls, MD;  Location: [facility];  Service: Ophthalmology;  Laterality: Left;   PHOTOCOAGULATION WITH LASER Left 11/10/2020   Procedure: PHOTOCOAGULATION WITH LASER;  Surgeon: Sherlynn Stalls, MD;  Location: [facility];  Service: Ophthalmology;  Laterality: Left;   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   Current Outpatient Medications on File Prior to Encounter  Medication Sig Dispense Refill   amiodarone (PACERONE) 200 MG tablet Take 200 mg by mouth daily.     atorvastatin (LIPITOR) 20 MG tablet Take 20 mg by mouth every evening.     clopidogrel  (PLAVIX) 75 MG tablet Take 75 mg by mouth daily.     ELIQUIS 5 MG TABS tablet Take 5 mg by mouth 2 (two) times daily.     glipiZIDE (GLUCOTROL) 10 MG tablet Take 10 mg by mouth 2 (two) times daily.     hydrochlorothiazide (HYDRODIURIL) 25 MG tablet Take 25 mg by mouth daily.     isosorbide mononitrate (IMDUR) 30 MG 24 hr tablet Take 30 mg by mouth daily.     LANTUS SOLOSTAR 100 UNIT/ML Solostar Pen Inject 34 Units into the skin every evening.     lisinopril (ZESTRIL) 20 MG tablet Take 20 mg by mouth daily.     metFORMIN (GLUCOPHAGE) 1000 MG tablet Take 1,000 mg by mouth in the morning and at bedtime.     metoprolol tartrate (LOPRESSOR) 25 MG tablet Take 25 mg by mouth 3 (three) times daily. (Morning, noon, evening)     nitroGLYCERIN (NITROSTAT) 0.3 MG SL tablet Place 0.3 mg under the tongue every 5 (five) minutes x 3 doses as needed for chest pain.     prednisoLONE acetate (PRED FORTE) 1 % ophthalmic suspension Place 1 drop into the left eye 2 (two) times daily.     terazosin (HYTRIN) 2 MG capsule Take 2 mg by mouth at bedtime.       Scheduled Meds:  amiodarone  200 mg Oral Daily   atorvastatin  20 mg Oral QPM   insulin aspart  0-9 Units Subcutaneous Q4H   isosorbide mononitrate  30 mg Oral Daily   metoprolol tartrate  25 mg Oral TID   prednisoLONE acetate  1 drop Left Eye BID   terazosin  2 mg Oral QHS   Continuous Infusions:  lactated ringers     PRN Meds:.acetaminophen **OR** acetaminophen, morphine injection, ondansetron **OR** ondansetron (ZOFRAN) IV  Allergies: No Known Allergies  Family History  Problem Relation Age of Onset   CAD Other     Social History:  reports that he has never smoked. He has never used smokeless tobacco. He reports previous alcohol use. He reports that he does not use drugs.  ROS: A complete review of systems was performed.  All systems are negative except for pertinent findings as noted.  Physical Exam:  Vital signs in last 24 hours: Temp:   [97.9 F (36.6 C)-98.9 F (37.2 C)] 97.9 F (36.6 C) (06/16 2233) Pulse Rate:  [55-67] 67 (06/16 2233) Resp:  [16-20] 20 (06/16 2233) BP: (142-165)/(75-83) 165/78 (06/16 2233) SpO2:  [98 %-100 %] 99 % (06/16 2233) Weight:  [113.4 kg] 113.4 kg (06/16 1240) Constitutional:  Alert and oriented, No acute distress Cardiovascular: Regular rate and rhythm, No JVD Respiratory: Normal respiratory effort, Lungs clear bilaterally GI: Abdomen is soft, mild right lower quadrant tenderness without rebound tenderness. GU: No CVA tenderness Lymphatic: No lymphadenopathy Neurologic: Grossly intact, no focal deficits Psychiatric: Normal mood and affect  Laboratory Data:  Recent Labs    04/30/21 1252  WBC 2.6*  HGB 9.7*  HCT 30.8*  PLT 162    Recent Labs    04/30/21 1252  NA 140  K 4.6  CL 108  GLUCOSE 157*  BUN 33*  CALCIUM 8.8*  CREATININE 2.40*     Results for orders placed or performed during the hospital encounter of 04/30/21 (from the past 24 hour(s))  Urinalysis, Routine w reflex microscopic Urine, Clean Catch     Status: Abnormal   Collection Time: 04/30/21 12:42 PM  Result Value Ref Range   Color, Urine YELLOW YELLOW   APPearance CLEAR CLEAR   Specific Gravity, Urine 1.017 1.005 - 1.030   pH 5.5 5.0 - 8.0   Glucose, UA NEGATIVE NEGATIVE mg/dL   Hgb urine dipstick LARGE (A) NEGATIVE   Bilirubin Urine NEGATIVE NEGATIVE   Ketones, ur NEGATIVE NEGATIVE mg/dL   Protein, ur TRACE (A) NEGATIVE mg/dL   Nitrite NEGATIVE NEGATIVE   Leukocytes,Ua NEGATIVE NEGATIVE   RBC / HPF 0-5 0 - 5 RBC/hpf   WBC, UA 0-5 0 - 5 WBC/hpf  Lipase, blood     Status: None   Collection Time: 04/30/21 12:52 PM  Result Value Ref Range   Lipase 13 11 - 51 U/L  Comprehensive metabolic panel     Status: Abnormal   Collection Time: 04/30/21 12:52 PM  Result Value Ref Range   Sodium 140 135 - 145 mmol/L   Potassium 4.6 3.5 - 5.1 mmol/L   Chloride 108 98 - 111 mmol/L   CO2 23 22 - 32 mmol/L    Glucose, Bld 157 (H) 70 - 99 mg/dL   BUN 33 (H) 8 - 23 mg/dL   Creatinine, Ser 2.40 (H) 0.61 - 1.24 mg/dL   Calcium 8.8 (L) 8.9 - 10.3 mg/dL   Total Protein 5.9 (L) 6.5 - 8.1 g/dL   Albumin 3.7 3.5 - 5.0 g/dL   AST 10 (L) 15 - 41 U/L   ALT 12 0 - 44 U/L   Alkaline Phosphatase 64 38 - 126 U/L   Total Bilirubin 0.6 0.3 - 1.2 mg/dL   GFR, Estimated 28 (L) >60 mL/min   Anion gap 9 5 - 15  CBC     Status: Abnormal   Collection Time: 04/30/21 12:52 PM  Result Value Ref Range   WBC 2.6 (L) 4.0 - 10.5 K/uL   RBC 3.81 (L) 4.22 - 5.81 MIL/uL   Hemoglobin 9.7 (L) 13.0 - 17.0 g/dL   HCT 30.8 (L) 39.0 - 52.0 %   MCV 80.8 80.0 - 100.0 fL   MCH 25.5 (L) 26.0 - 34.0 pg   MCHC 31.5 30.0 - 36.0 g/dL   RDW 15.9 (H) 11.5 - 15.5 %   Platelets 162 150 - 400 K/uL   nRBC 0.0 0.0 - 0.2 %  Resp Panel by RT-PCR (Flu A&B, Covid) Nasopharyngeal Swab     Status: None   Collection Time: 04/30/21  2:58 PM   Specimen: Nasopharyngeal Swab; Nasopharyngeal(NP) swabs in vial transport medium  Result Value Ref Range   SARS Coronavirus 2 by RT PCR NEGATIVE NEGATIVE   Influenza A by PCR NEGATIVE NEGATIVE   Influenza B by PCR NEGATIVE NEGATIVE  CBG monitoring, ED     Status: None   Collection Time: 04/30/21  8:30 PM  Result Value Ref Range   Glucose-Capillary 94 70 - 99 mg/dL  Glucose, capillary     Status: Abnormal   Collection Time: 04/30/21 11:59 PM  Result Value Ref Range   Glucose-Capillary 104 (H) 70 - 99 mg/dL   Recent Results (from the past 240 hour(s))  Resp Panel by RT-PCR (Flu A&B, Covid) Nasopharyngeal Swab     Status: None  Collection Time: 04/30/21  2:58 PM   Specimen: Nasopharyngeal Swab; Nasopharyngeal(NP) swabs in vial transport medium  Result Value Ref Range Status   SARS Coronavirus 2 by RT PCR NEGATIVE NEGATIVE Final    Comment: (NOTE) SARS-CoV-2 target nucleic acids are NOT DETECTED.  The SARS-CoV-2 RNA is generally detectable in upper respiratory specimens during the acute phase of  infection. The lowest concentration of SARS-CoV-2 viral copies this assay can detect is 138 copies/mL. A negative result does not preclude SARS-Cov-2 infection and should not be used as the sole basis for treatment or other patient management decisions. A negative result may occur with  improper specimen collection/handling, submission of specimen other than nasopharyngeal swab, presence of viral mutation(s) within the areas targeted by this assay, and inadequate number of viral copies(<138 copies/mL). A negative result must be combined with clinical observations, patient history, and epidemiological information. The expected result is Negative.  Fact Sheet for Patients:  EntrepreneurPulse.com.au  Fact Sheet for Healthcare Providers:  IncredibleEmployment.be  This test is no t yet approved or cleared by the [place] FDA and  has been authorized for detection and/or diagnosis of SARS-CoV-2 by FDA under an Emergency Use Authorization (EUA). This EUA will remain  in effect (meaning this test can be used) for the duration of the COVID-19 declaration under Section 564(b)(1) of the Act, 21 U.S.C.section 360bbb-3(b)(1), unless the authorization is terminated  or revoked sooner.       Influenza A by PCR NEGATIVE NEGATIVE Final   Influenza B by PCR NEGATIVE NEGATIVE Final    Comment: (NOTE) The Xpert Xpress SARS-CoV-2/FLU/RSV plus assay is intended as an aid in the diagnosis of influenza from Nasopharyngeal swab specimens and should not be used as a sole basis for treatment. Nasal washings and aspirates are unacceptable for Xpert Xpress SARS-CoV-2/FLU/RSV testing.  Fact Sheet for Patients: EntrepreneurPulse.com.au  Fact Sheet for Healthcare Providers: IncredibleEmployment.be  This test is not yet approved or cleared by the [place] FDA and has been authorized for detection and/or diagnosis of SARS-CoV-2  by FDA under an Emergency Use Authorization (EUA). This EUA will remain in effect (meaning this test can be used) for the duration of the COVID-19 declaration under Section 564(b)(1) of the Act, 21 U.S.C. section 360bbb-3(b)(1), unless the authorization is terminated or revoked.  Performed at [facility], [address] [place], [place] [address]     Renal Function: Recent Labs    04/30/21 1252  CREATININE 2.40*   Estimated Creatinine Clearance: 34.6 mL/min (A) (by C-G formula based on SCr of 2.4 mg/dL (H)).  Radiologic Imaging: CT Abdomen Pelvis Wo Contrast  Result Date: 04/30/2021 CLINICAL DATA:  Right lower quadrant abdominal pain EXAM: CT ABDOMEN AND PELVIS WITHOUT CONTRAST TECHNIQUE: Multidetector CT imaging of the abdomen and pelvis was performed following the standard protocol without IV contrast. COMPARISON:  None. FINDINGS: Lower chest: 6 mm mean diameter noncalcified pulmonary nodule is seen within the visualized right middle lobe, indeterminate. Scattered ground-glass pulmonary infiltrates in the noted at the lung bases bilaterally with subtle, smooth interlobular septal thickening possibly representing changes of trace pulmonary edema, possibly cardiogenic in nature. Extensive multi-vessel coronary artery calcification is present with probable stenting of the distal right coronary artery. Global cardiac size within normal limits. No pericardial effusion. Hypoattenuation of the cardiac blood pool is in keeping with at least mild anemia. Hepatobiliary: No focal liver abnormality is seen. No gallstones, gallbladder wall thickening, or biliary dilatation. Pancreas: Unremarkable Spleen: There is moderate splenomegaly with the  spleen measuring 17.2 cm in greatest dimension. No definite intrasplenic lesion identified on this noncontrast examination. Adrenals/Urinary Tract: The adrenal glands are unremarkable. The kidneys are normal in size and position. There is mild  right hydronephrosis secondary to an obstructing lobulated mass in the region of the right ureteropelvic junction measuring 3.1 x 4.5 x 3.8 cm on axial image # 58 and coronal image # 67 while difficult to accurately characterize in the absence of contrast administration, the mass appears external to the right ureter and demonstrates occlusion of the a proximal right ureter likely related to extrinsic mass effect, best appreciated on coronal imaging. Mild bilateral nonspecific perinephric stranding. No intrarenal or ureteral calculi are identified no hydronephrosis on the left. Appearing calcifications along the posterior wall of the bladder in the region of the ureteropelvic junction may represent posterior layering calculi within the bladder lumen or may be artifactual and related to streak artifact from right total hip arthroplasty. Stomach/Bowel: The stomach, small bowel, and large bowel are unremarkable. No evidence of obstruction or focal inflammation. The appendix is normal. No free intraperitoneal gas or fluid. Vascular/Lymphatic: There is extensive mesenteric infiltration. Additionally, there is pathologic mesenteric adenopathy with the dominant mass measuring 6.7 x 4.1 cm at axial image # 44/2. Multiple additional pathologically enlarged mesenteric lymph nodes are identified demonstrating irregular margins. There is, additionally, pathologic bilateral common iliac and external iliac lymphadenopathy as well as right pelvic sidewall lymphadenopathy with the index right common iliac lymph node measuring 1.8 x 3.7 cm at axial image # 72/2. Borderline pathologic aortocaval and left periaortic lymphadenopathy is present. There is moderate aortoiliac atherosclerotic calcification. No aortic aneurysm. Reproductive: Mild prostatic enlargement. Seminal vesicles are unremarkable. Other: Soft tissue nodule within the right retroperitoneum adjacent to the right psoas muscle may represent pathologic retroperitoneal  adenopathy or a metastatic implant. This measures 1.7 x 3.0 cm at axial image # 63/2. 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 Degenerative changes are seen within the lumbar spine. No lytic or blastic bone lesions are identified. IMPRESSION: Pathologic mesenteric, retroperitoneal, and pelvic adenopathy, as described above. Moderate splenomegaly. Lobulated retroperitoneal soft tissue masses in the region of the right ureteropelvic junction likely resulting in mild right hydronephrosis secondary to extrinsic mass effect as well as within the right retroperitoneum. 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 Less likely, this may represent metastatic disease secondary to a primary urothelial malignancy or potentially metastatic disease related to underlying prostate cancer, however, the nodal distribution would be unusual for the former and the lack of osseous metastatic disease given the extensive nodal involvement would be unusual for the latter. PET CT examination may be more helpful for further evaluation as well as identification of a a optimal tissue sampling target. Pathologic right external iliac lymphadenopathy may be amenable to ultrasound-guided sampling for definitive diagnosis. 6 mm of right middle lobe pulmonary nodule. Non-contrast chest CT at 6-12 months is recommended. If the nodule is stable at time of repeat CT, then future CT at 18-24 months (from today's scan) is considered optional for low-risk patients, but is recommended for high-risk patients. This recommendation follows the consensus statement: Guidelines for Management of Incidental Pulmonary Nodules Detected on CT Images: From the Fleischner Society 2017; Radiology 2017; 284:228-243. Extensive coronary artery calcification. Scattered ground-glass pulmonary infiltrates and subtle smooth interlobular septal thickening may reflect changes of mild cardiogenic failure. At least mild anemia. Aortic  Atherosclerosis (ICD10-I70.0). Electronically Signed   By: Fidela Salisbury MD   On: 04/30/2021  14:38    I independently reviewed the above imaging studies.  Impression/Recommendation 1.  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 Plan recommendation: We will proceed with cystoscopy insertion of right JJ stent to maximize renal function.  Patient will likely need interventional radiology tissue biopsy for diagnosis but want to exclude intraluminal ureteral mass first and stent to maximize renal function.  We discussed the procedure in detail through his daughter who interpreted over the telephone regarding the procedure and its risks and associated benefits.  Patient agreeable to proceed tomorrow.  Patient unfortunately ate prior to coming to the hospital on transfer tonight.  Will make n.p.o. for tomorrow.  [name] 05/01/2021, 12:23 AM      CC:

## 2021-05-01 NOTE — Consult Note (Signed)
Chart, labs and imaging reviewed. Cr was up some compared to last night. Discussed patient with Dr. [name].  I drew patient a picture of the anatomy and I know Dr. [name] went over the procedure with the patient and his daughter but I discussed with him the nature, potential benefits, risks and alternatives to cystoscopy, right retrograde pyelogram and right ureteral stent placement, including side effects of the proposed treatment, the likelihood of the patient achieving the goals of the procedure, and any potential problems that might occur during the procedure or recuperation. All questions answered. Patient elects to proceed.

## 2021-05-01 NOTE — Discharge Instructions (Signed)
??????????? ?????? ???????????, ???? ????? ????????? Ureteral Stent Implantation, Care After ?? ???? ???????? ????????? ?????????? ? ???, ??? ????????? ?? ????? ????? ????????? ?????????. ??? ??????? ???? ????? ????? ???????????? ??? ????? ????????? ???????????. ?????????? ? ?????? ???????? ?????, ???? ? ??? ?????????????-???? ?????????? ??? ???????. ??? ????? ??????? ????? ?????? ?????????? ????? ???? ????????? ?????? ??????: ???????. ?????? ???? ??? ??????????????. ?? ?????? ??????????? ???? ? ??????? ???????? ??? ?????? ????? ??????. ???? ?????? ???????????? ? ??????? ?????????? ????? ????? ??????????????. ??? ????? ???????????? ????? 1 ??????. ????????? ?????????? ????? ? ???? ? ??????? ?????????? ????. ? ???????? ???????? ???????? ????? ????????????: ????????????? ????????? ?????????? ?????????????? ? ??????????? ????????????? ????????? ?????? ??? ??????? ????? ??????? ??????. ???? ??? ????????? ??????????, ?????????? ??? ??? ??????? ????? ??????? ??????. ?? ??????? ?????????? ????? ???????????, ???? ???? ?? ??????? ??????????? ???? ?????. ?? ??????? ?????? ?????? ? ??????? 24 ?????, ???? ??? ?? ????? ????????? ???? ?????????? ????????. ???????? ? ?????? ???????? ?????, ??????? ?? ??? ???????? ???????? ?????????? ??? ?????????? ??????? ???????? ? ????? ? ??????? ???????????? ?????????. ???? ???????????? ?????????, ??? ??????? ????? ??????? ??????. ????????? ???????????????? ??????? ??? ????????. ?????? 1-2 ???? ????????????, ????? ????????. ??? ????? ??? ????????? ????????? ? ???????. ???? ?? ?????????? ????????, ??? ??? ?????? ???????????? ??????????, ????????? ? ??????. ????????????? ? ????? ???????????? ????? ??? ??????? ????? ??????? ??????. ???????? ? ?????? ???????? ?????, ????? ???? ???????? ??????????? ??? ???. ????? ????????  ??????? ?? ?????????? ????? ? ????. ???????? ?????? ???????? ?????, ???? ?????????? ????? ? ???? ?????????????. ???? ? ??? ?????????? ???????: ??????????  ??????????? ?????? ???????? ????? ?? ????? ?? ????????? ? ???????? ??? ????? ????. ?? ?????????, ?? ???????? ? ?? ?????????? ??????? ?????, ???? ??? ??????? ???? ?? ????????. ???????? ? ?????? ???????? ?????, ????? ?? ??? ????????? ???. ??? ????? ???? ????????? ?????? ?????????? ??????. ????? ?????????? ????????, ????? ???? ???? ?????????? ??????-??????? ?????. ?? ???????????? ??????, ??? ???????? ??????? ??? ?????, ???????? ????????, ??????????? ???????? ? ??????????? ?????. ??? ????? ????????? ?????????? ????? ????????????? ????????. ???? ??? ?????? ?? ???? ???????? ??? ????? ??????, ?????????? ? ?????? ???????? ?????. ????????? ?? ??? ?????? ???????????? ?????????? ? ???????????? ? ?????????? ?????? ???????? ?????. ??? ?????.   ?????????? ? ???????? ?????, ????: ???????? ?? ????? ????????, ???? ??????????? ??? ?? ????????, ???????? ???? ??? ??????????????. ??? ?????? ?????????? ??????? ??????. ?? ?????????? ????????????? ??? ??? ???????????? ???? ?? ?????????? ????? 2 ???? ????? ?????????. ?????????? ?????????? ?? ???????, ????: ???? ????? ?????-???????? ????? ??? ? ??? ???????????? ??????? ?????. ? ??? ?????????? ?????? ???? (?????????? ????). ????? ?????? ????????? ?? ??????. ?? ?? ?????? ????????. ? ??? ???????? ????????? ?????????, ?????? ??? ??????? ???? ? ?????? ??? ? ????????. ? ??? ?????????. ???? ???? ????? ??? ???????. ??? ????? ?????? ??????. ?????? ????? ???? ????????? ?????? ????????? ?????? ???? ??? ??????????????, ??????? ???????? ? ??????? ?????????? ????? ????? ??????????????. ??? ????? ???????????? ????? 1 ??????. ??????? ?? ?????????? ????? ? ????. ???????? ?????? ???????? ?????, ???? ?????????? ????? ? ???? ?????????????. ?????????? ?????????????? ? ??????????? ????????????? ????????? ?????? ??? ??????? ????? ??????? ??????. ????? ?????????? ????????, ????? ???? ???? ?????????? ??????-??????? ?????. ??? ?????????? ?? ????? ???????? ??????, ??????????????? ?????  ??????.??????????? ???????? ????? ???????????? ??? ??????? ? ????? ??????? ??????. Document Revised: 08/23/2018 Document Reviewed: 08/23/2018 Elsevier Patient Education  2022 [employer].     Ureteral Stent Implantation, Care After This sheet gives you information about how to care for yourself after your procedure. Your health care provider may also give you more specific instructions. If you have problems or questions, contact your health  careprovider.  Be sure to follow-up with Dr. [name] for stent management, removal or exchange.  What can I expect after the procedure? After the procedure, it is common to have: Nausea. Mild pain when you urinate. You may feel this pain in your lower back or lower abdomen. The pain should stop within a few minutes after you urinate. This may last for up to 1 week. A small amount of blood in your urine for several days. Follow these instructions at home: Medicines Take over-the-counter and prescription medicines only as told by your health care provider. If you were prescribed an antibiotic medicine, take it as told by your health care provider. Do not stop taking the antibiotic even if you start to feel better. Do not drive for 24 hours if you were given a sedative during your procedure. Ask your health care provider if the medicine prescribed to you requires you to avoid driving or using heavy machinery. Activity Rest as told by your health care provider. Avoid sitting for a long time without moving. Get up to take short walks every 1-2 hours. This is important to improve blood flow and breathing. Ask for help if you feel weak or unsteady. Return to your normal activities as told by your health care provider. Ask your health care provider what activities are safe for you. General instructions  Watch for any blood in your urine. Call your health care provider if the amount of blood in your urine increases. If you have a catheter: Follow  instructions from your health care provider about taking care of your catheter and collection bag. Do not take baths, swim, or use a hot tub until your health care provider approves. Ask your health care provider if you may take showers. You may only be allowed to take sponge baths. Drink enough fluid to keep your urine pale yellow. Do not use any products that contain nicotine or tobacco, such as cigarettes, e-cigarettes, and chewing tobacco. These can delay healing after surgery. If you need help quitting, ask your health care provider. Keep all follow-up visits as told by your health care provider. This is important.   Contact a health care provider if: You have pain that gets worse or does not get better with medicine, especially pain when you urinate. You have difficulty urinating. You feel nauseous or you vomit repeatedly during a period of more than 2 days after the procedure. Get help right away if: Your urine is dark red or has blood clots in it. You are leaking urine (have incontinence). The end of the stent comes out of your urethra. You cannot urinate. You have sudden, sharp, or severe pain in your abdomen or lower back. You have a fever. You have swelling or pain in your legs. You have difficulty breathing. Summary After the procedure, it is common to have mild pain when you urinate that goes away within a few minutes after you urinate. This may last for up to 1 week. Watch for any blood in your urine. Call your health care provider if the amount of blood in your urine increases. Take over-the-counter and prescription medicines only as told by your health care provider. Drink enough fluid to keep your urine pale yellow. This information is not intended to replace advice given to you by your health care provider. Make sure you discuss any questions you have with your healthcare provider. Document Revised: 08/08/2018 Document Reviewed: 08/09/2018 Elsevier Patient Education  2022  [employer].

## 2021-05-01 NOTE — H&P (Signed)
Chief Complaint: Patient was seen in consultation today for image guided biopsy of a right perirenal mass Chief Complaint  Patient presents with   Abdominal Pain   at the request of Dr. Tawanna Solo. A.   Referring Physician(s): Dr.Adhikari. A.   Supervising Physician: Jacqulynn Cadet  Patient Status: [facility]  History of Present Illness: [name] is a 76 y.o. male with past medical history of CAD, HTN, DM, sleep apnea who is currently admitted due to acute kidney failure with right hydronephrosis secondary to right retroperitoneal mass, s/p cystoscopy and right ureteral stent placement with Dr. Junious Silk on 05/01/2021.  Patient presented to ED on 04/30/2021 with chief complaint of RLQ pain, underwent CT abdomen pelvis without contrast which showed pathologic mesenteric, retroperitoneal, and pelvic adenopathy, moderate splenomegaly, lobulated retroperitoneal soft tissue masses in the region of the right uteropelvic junction likely resulting in mild right hydronephrosis secondary to extrinsic mass effect as well as within the right retroperitoneum.  CMP on 04/30/21 showed BUN 33, Cr. 2.4, and GFR 8. Patient was admitted and underwent cystoscopy and right ureteral stent placement with Dr. Junious Silk on 05/01/2021.   IR was requested for biopsy of a right perirenal mass.   Patient is [place] speaking only, interpreter was used.  Patient laying in bed, not in acute distress.  Denise headache, fever, chills, shortness of breath, cough, chest pain, abdominal pain, nausea ,vomiting.     Past Medical History:  Diagnosis Date   Anginal pain ([place])    Coronary artery disease    Diabetes mellitus without complication ([place])    Dysrhythmia    Hypertension    Sleep apnea     Past Surgical History:  Procedure Laterality Date   CARDIAC CATHETERIZATION     EYE SURGERY     INJECTION OF SILICONE OIL Left 41/32/4401   Procedure: INJECTION OF SILICONE OIL;  Surgeon: Sherlynn Stalls, MD;   Location: [place];  Service: Ophthalmology;  Laterality: Left;   INJECTION OF SILICONE OIL Left 0/27/2536   Procedure: INJECTION OF SILICONE OIL;  Surgeon: Sherlynn Stalls, MD;  Location: [place];  Service: Ophthalmology;  Laterality: Left;   JOINT REPLACEMENT Right    hip   LASER PHOTO ABLATION Left 12/25/2020   Procedure: LASER PHOTO ABLATION;  Surgeon: Sherlynn Stalls, MD;  Location: [place];  Service: Ophthalmology;  Laterality: Left;   PARS PLANA VITRECTOMY Left 11/10/2020   Procedure: PARS PLANA VITRECTOMY WITH 25 GAUGE;  Surgeon: Sherlynn Stalls, MD;  Location: [place];  Service: Ophthalmology;  Laterality: Left;   PARS PLANA VITRECTOMY Left 12/25/2020   Procedure: PARS PLANA VITRECTOMY WITH 25 GAUGE IN LEFT EYE;  Surgeon: Sherlynn Stalls, MD;  Location: [place];  Service: Ophthalmology;  Laterality: Left;   PERFLUORONE INJECTION Left 12/25/2020   Procedure: PERFLUORONE INJECTION;  Surgeon: Sherlynn Stalls, MD;  Location: [place];  Service: Ophthalmology;  Laterality: Left;   PHOTOCOAGULATION WITH LASER Left 11/10/2020   Procedure: PHOTOCOAGULATION WITH LASER;  Surgeon: Sherlynn Stalls, MD;  Location: [place];  Service: Ophthalmology;  Laterality: Left;   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  Medications: Prior to Admission medications   Medication  Sig Start Date End Date Taking? Authorizing Provider  amiodarone (PACERONE) 200 MG tablet Take 200 mg by mouth daily. 04/02/21  Yes [provider]  atorvastatin (LIPITOR) 20 MG tablet Take 20 mg by mouth every evening. 11/04/20  Yes [provider]  clopidogrel (PLAVIX) 75 MG tablet Take  75 mg by mouth daily. 12/17/20  Yes [provider]  ELIQUIS 5 MG TABS tablet Take 5 mg by mouth 2 (two) times daily. 11/05/20  Yes [provider]  glipiZIDE (GLUCOTROL) 10 MG tablet Take 10 mg by mouth 2 (two) times daily. 10/03/20  Yes [provider]  hydrochlorothiazide (HYDRODIURIL) 25 MG tablet Take 25 mg by mouth daily. 10/07/20  Yes [provider]  isosorbide mononitrate (IMDUR) 30 MG 24 hr tablet Take 30 mg by mouth daily. 09/08/20  Yes [provider]  LANTUS SOLOSTAR 100 UNIT/ML Solostar Pen Inject 34 Units into the skin every evening. 11/05/20  Yes [provider]  lisinopril (ZESTRIL) 20 MG tablet Take 20 mg by mouth daily. 04/06/21  Yes [provider]  metFORMIN (GLUCOPHAGE) 1000 MG tablet Take 1,000 mg by mouth in the morning and at bedtime.   Yes [provider]  metoprolol tartrate (LOPRESSOR) 25 MG tablet Take 25 mg by mouth 3 (three) times daily. (Morning, noon, evening) 10/23/20  Yes [provider]  nitroGLYCERIN (NITROSTAT) 0.3 MG SL tablet Place 0.3 mg under the tongue every 5 (five) minutes x 3 doses as needed for chest pain. 09/08/20  Yes [provider]  prednisoLONE acetate (PRED FORTE) 1 % ophthalmic suspension Place 1 drop into the left eye 2 (two) times daily. 11/25/20  Yes [provider]  terazosin (HYTRIN) 2 MG capsule Take 2 mg by mouth at bedtime. 10/03/20  Yes [provider]     Family History  Problem Relation Age of Onset   CAD Other     Social History   Socioeconomic History   Marital status: Married    Spouse name: Not on file   Number of children: Not on file   Years of education: Not on file   Highest education level: Not on file  Occupational History   Not on file  Tobacco Use   Smoking status: Never   Smokeless tobacco: Never  Vaping Use   Vaping Use: Never used  Substance and Sexual Activity   Alcohol use: Not Currently   Drug use:  Never   Sexual activity: Yes  Other Topics Concern   Not on file  Social History Narrative   Not on file   Social Determinants of Health   Financial Resource Strain: Not on file  Food Insecurity: Not on file  Transportation Needs: Not on file  Physical Activity: Not on file  Stress: Not on file  Social Connections: Not on file     Review of Systems: A 12 point ROS discussed and pertinent positives are indicated in the HPI above.  All other systems are negative.   Vital Signs: BP (!) 160/91 (BP Location: Left Arm)   Pulse 61   Temp 97.8 F (36.6 C) (Oral)   Resp 16   Ht 5\' 10"  (1.778 m)   Wt 250 lb (113.4 kg)   SpO2 98%   BMI 35.87 kg/m   Physical Exam Vitals reviewed.  Constitutional:      General: He is not in acute distress.    Appearance: He is well-developed. He is not ill-appearing.  HENT:     Head: Normocephalic  and atraumatic.  Cardiovascular:     Rate and Rhythm: Normal rate and regular rhythm.     Heart sounds: Normal heart sounds.  Pulmonary:     Effort: Pulmonary effort is normal.     Breath sounds: Normal breath sounds.  Abdominal:     General: Bowel sounds are normal.     Palpations: Abdomen is soft.  Genitourinary:    Comments: Positive for foley catheter. Hematuria noted in the foley bag.  Skin:    General: Skin is warm and dry.     Coloration: Skin is not cyanotic or jaundiced.  Neurological:     Mental Status: He is alert and oriented to person, place, and time.  Psychiatric:        Mood and Affect: Mood normal.        Behavior: Behavior normal.    MD Evaluation Airway: WNL Heart: WNL Abdomen: WNL Chest/ Lungs: WNL ASA  Classification: 3 Mallampati/Airway Score: Two  Imaging: CT Abdomen Pelvis Wo Contrast  Result Date: 04/30/2021 CLINICAL DATA:  Right lower quadrant abdominal pain EXAM: CT ABDOMEN AND PELVIS WITHOUT CONTRAST TECHNIQUE: Multidetector CT imaging of the abdomen and pelvis was performed following the standard  protocol without IV contrast. COMPARISON:  None. FINDINGS: Lower chest: 6 mm mean diameter noncalcified pulmonary nodule is seen within the visualized right middle lobe, indeterminate. Scattered ground-glass pulmonary infiltrates in the noted at the lung bases bilaterally with subtle, smooth interlobular septal thickening possibly representing changes of trace pulmonary edema, possibly cardiogenic in nature. Extensive multi-vessel coronary artery calcification is present with probable stenting of the distal right coronary artery. Global cardiac size within normal limits. No pericardial effusion. Hypoattenuation of the cardiac blood pool is in keeping with at least mild anemia. Hepatobiliary: No focal liver abnormality is seen. No gallstones, gallbladder wall thickening, or biliary dilatation. Pancreas: Unremarkable Spleen: There is moderate splenomegaly with the spleen measuring 17.2 cm in greatest dimension. No definite intrasplenic lesion identified on this noncontrast examination. Adrenals/Urinary Tract: The adrenal glands are unremarkable. The kidneys are normal in size and position. There is mild right hydronephrosis secondary to an obstructing lobulated mass in the region of the right ureteropelvic junction measuring 3.1 x 4.5 x 3.8 cm on axial image # 58 and coronal image # 67 while difficult to accurately characterize in the absence of contrast administration, the mass appears external to the right ureter and demonstrates occlusion of the a proximal right ureter likely related to extrinsic mass effect, best appreciated on coronal imaging. Mild bilateral nonspecific perinephric stranding. No intrarenal or ureteral calculi are identified no hydronephrosis on the left. Appearing calcifications along the posterior wall of the bladder in the region of the ureteropelvic junction may represent posterior layering calculi within the bladder lumen or may be artifactual and related to streak artifact from right total  hip arthroplasty. Stomach/Bowel: The stomach, small bowel, and large bowel are unremarkable. No evidence of obstruction or focal inflammation. The appendix is normal. No free intraperitoneal gas or fluid. Vascular/Lymphatic: There is extensive mesenteric infiltration. Additionally, there is pathologic mesenteric adenopathy with the dominant mass measuring 6.7 x 4.1 cm at axial image # 44/2. Multiple additional pathologically enlarged mesenteric lymph nodes are identified demonstrating irregular margins. There is, additionally, pathologic bilateral common iliac and external iliac lymphadenopathy as well as right pelvic sidewall lymphadenopathy with the index right common iliac lymph node measuring 1.8 x 3.7 cm at axial image # 72/2. Borderline pathologic aortocaval and left periaortic lymphadenopathy is present. There is moderate  aortoiliac atherosclerotic calcification. No aortic aneurysm. Reproductive: Mild prostatic enlargement. Seminal vesicles are unremarkable. Other: Soft tissue nodule within the right retroperitoneum adjacent to the right psoas muscle may represent pathologic retroperitoneal adenopathy or a metastatic implant. This measures 1.7 x 3.0 cm at axial image # 63/2. 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 Degenerative changes are seen within the lumbar spine. No lytic or blastic bone lesions are identified. IMPRESSION: Pathologic mesenteric, retroperitoneal, and pelvic adenopathy, as described above. Moderate splenomegaly. Lobulated retroperitoneal soft tissue masses in the region of the right ureteropelvic junction likely resulting in mild right hydronephrosis secondary to extrinsic mass effect as well as within the right retroperitoneum. 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 Less likely, this may represent metastatic disease secondary to a primary urothelial malignancy or potentially metastatic disease related to underlying prostate cancer, however,  the nodal distribution would be unusual for the former and the lack of osseous metastatic disease given the extensive nodal involvement would be unusual for the latter. PET CT examination may be more helpful for further evaluation as well as identification of a a optimal tissue sampling target. Pathologic right external iliac lymphadenopathy may be amenable to ultrasound-guided sampling for definitive diagnosis. 6 mm of right middle lobe pulmonary nodule. Non-contrast chest CT at 6-12 months is recommended. If the nodule is stable at time of repeat CT, then future CT at 18-24 months (from today's scan) is considered optional for low-risk patients, but is recommended for high-risk patients. This recommendation follows the consensus statement: Guidelines for Management of Incidental Pulmonary Nodules Detected on CT Images: From the Fleischner Society 2017; Radiology 2017; 284:228-243. Extensive coronary artery calcification. Scattered ground-glass pulmonary infiltrates and subtle smooth interlobular septal thickening may reflect changes of mild cardiogenic failure. At least mild anemia. Aortic Atherosclerosis (ICD10-I70.0). Electronically Signed   By: Fidela Salisbury MD   On: 04/30/2021 14:38   DG C-Arm 1-60 Min-No Report  Result Date: 05/01/2021 Fluoroscopy was utilized by the requesting physician.  No radiographic interpretation.    Labs:  CBC: Recent Labs    11/10/20 1252 11/10/20 1444 04/30/21 1252 05/01/21 0515  WBC 3.0*  --  2.6* 2.0*  HGB 11.7* 11.6* 9.7* 9.7*  HCT 35.1* 34.0* 30.8* 30.4*  PLT  --   --  162 164    COAGS: No results for input(s): INR, APTT in the last 8760 hours.  BMP: Recent Labs    11/10/20 1252 11/10/20 1444 12/25/20 1103 04/30/21 1252 05/01/21 0515  NA 138 141 140 140 139  K 6.9* 4.3 4.4 4.6 4.4  CL 108 107 108 108 109  CO2 22  --  22 23 21*  GLUCOSE 147* 117* 167* 157* 123*  BUN 22 22 27* 33* 31*  CALCIUM 8.8*  --  8.8* 8.8* 8.7*  CREATININE 1.19 1.20  1.40* 2.40* 2.45*  GFRNONAA >60  --  53* 28* 27*    LIVER FUNCTION TESTS: Recent Labs    04/30/21 1252  BILITOT 0.6  AST 10*  ALT 12  ALKPHOS 64  PROT 5.9*  ALBUMIN 3.7    TUMOR MARKERS: No results for input(s): AFPTM, CEA, CA199, CHROMGRNA in the last 8760 hours.  Assessment and Plan: 75 y.o. male with acute kidney failure with right hydronephrosis secondary to right retroperitoneal mass, s/p cystoscopy and right ureteral stent placement with Dr. Junious Silk on 05/01/2021.   IR was requested for image guided biopsy of the right perirenal mass. Case was reviewed by Dr. Laurence Ferrari, approved for a  CT-guided biopsy of the right mass biopsy mass.  The procedure is tentatively scheduled for next Monday, 05/04/2021 pending IR schedule.  N.p.o. at midnight Monday CBC on 05/01/2021: leucocytopenia and anemia, stable.  Plt 164 BMP on 05/01/2021: BUN 31, creatinine 2.45, GFR 27, stable Not on anticoagulant or antiplatelet treatment INR ordered Will check labs in the morning of the procedure  Risks and benefits of right perirenal mass biopsy was discussed with the patient and/or patient's family including, but not limited to bleeding, infection, damage to adjacent structures or low yield requiring additional tests.  All of the questions were answered and there is agreement to proceed.  Consent signed and in chart.    Thank you for this interesting consult.  I greatly enjoyed meeting [name] and look forward to participating in their care.  A copy of this report was sent to the requesting provider on this date.  Electronically Signed: Tera Mater, PA-C 05/01/2021, 4:07 PM   I spent a total of 40 Minutes    in face to face in clinical consultation, greater than 50% of which was counseling/coordinating care for right perirenal mass biopsy

## 2021-05-01 NOTE — Anesthesia Preprocedure Evaluation (Addendum)
Anesthesia Evaluation  Patient identified by MRN, date of birth, ID band Patient awake    Reviewed: Allergy & Precautions, NPO status , Patient's Chart, lab work & pertinent test results, reviewed documented beta blocker date and time   Airway Mallampati: III  TM Distance: >3 FB Neck ROM: Full    Dental no notable dental hx. (+) Teeth Intact   Pulmonary sleep apnea ,    Pulmonary exam normal breath sounds clear to auscultation       Cardiovascular hypertension, Pt. on medications and Pt. on home beta blockers + angina + CAD and + Cardiac Stents (DES 11/2020 on Aspirin/Plavix)  Normal cardiovascular exam+ dysrhythmias (on Eliquis) Atrial Fibrillation  Rhythm:Regular Rate:Normal     Neuro/Psych negative neurological ROS  negative psych ROS   GI/Hepatic negative GI ROS, Neg liver ROS,   Endo/Other  diabetes, Well Controlled, Type 2, Insulin Dependent, Oral Hypoglycemic Agents  Renal/GU ARFRenal disease  negative genitourinary   Musculoskeletal negative musculoskeletal ROS (+)   Abdominal (+) + obese,   Peds negative pediatric ROS (+)  Hematology  (+) anemia ,   Anesthesia Other Findings   Reproductive/Obstetrics negative OB ROS                             Anesthesia Physical  Anesthesia Plan  ASA: 3  Anesthesia Plan: General   Post-op Pain Management:    Induction: Intravenous  PONV Risk Score and Plan: 3 and Ondansetron and Treatment may vary due to age or medical condition  Airway Management Planned: LMA  Additional Equipment: None  Intra-op Plan:   Post-operative Plan: Extubation in OR  Informed Consent: I have reviewed the patients History and Physical, chart, labs and discussed the procedure including the risks, benefits and alternatives for the proposed anesthesia with the patient or authorized representative who has indicated his/her understanding and acceptance.      Dental advisory given and Interpreter used for interveiw  Plan Discussed with: CRNA  Anesthesia Plan Comments: ( thicke  )       Anesthesia Quick Evaluation                                  Anesthesia Evaluation  Patient identified by MRN, date of birth, ID band Patient awake    Reviewed: Allergy & Precautions, NPO status , Patient's Chart, lab work & pertinent test results, reviewed documented beta blocker date and time   Airway Mallampati: III  TM Distance: >3 FB Neck ROM: Full    Dental  (+) Teeth Intact, Dental Advisory Given Permanent bridges:   Pulmonary sleep apnea and Continuous Positive Airway Pressure Ventilation ,    Pulmonary exam normal breath sounds clear to auscultation       Cardiovascular hypertension, Pt. on medications and Pt. on home beta blockers + CAD  + dysrhythmias (on eliquis, LD yesterday AM) Atrial Fibrillation  Rhythm:Irregular Rate:Normal  Has not taken any medications today, takes metoprolol 3x/d (last dose yesterday 10PM)   Neuro/Psych negative neurological ROS  negative psych ROS   GI/Hepatic negative GI ROS, Neg liver ROS,   Endo/Other  diabetes, Poorly Controlled, Type 2, Oral Hypoglycemic Agents, Insulin DependentT2DM on insulin-38 units last night (his normal dose) Obesity BMI 34 FS 161 at 1204  Renal/GU negative Renal ROS  negative genitourinary   Musculoskeletal negative musculoskeletal ROS (+)   Abdominal (+) + obese,  Peds  Hematology  (+) Blood dyscrasia, anemia , hct 35.1    Anesthesia Other Findings Left retinal detachment   Reproductive/Obstetrics negative OB ROS                           Anesthesia Physical Anesthesia Plan  ASA: III  Anesthesia Plan: MAC   Post-op Pain Management:    Induction:   PONV Risk Score and Plan: Propofol infusion, TIVA and Treatment may vary due to age or medical condition  Airway Management Planned: Natural Airway and Nasal  Cannula  Additional Equipment: None  Intra-op Plan:   Post-operative Plan:   Informed Consent: I have reviewed the patients History and Physical, chart, labs and discussed the procedure including the risks, benefits and alternatives for the proposed anesthesia with the patient or authorized representative who has indicated his/her understanding and acceptance.       Plan Discussed with: CRNA  Anesthesia Plan Comments:        Anesthesia Quick Evaluation

## 2021-05-01 NOTE — H&P (Signed)
History and Physical    [name] LKJ:[id number] DOB: Mar 16, 1946 DOA: 04/30/2021  PCP: [name], MD  Patient coming from: Home  I have personally briefly reviewed patient's old medical records in [place]  Chief Complaint: Abd pain  HPI: [name] is a 75 y.o. male with medical history significant of DM2, HTN, CAD s/p PCI with stent placement in Jan, preserved LVEF as of Nov 21 on Plavix; PAF on amiodarone and eliquis, DM2.  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  Pt presents to ED at [facility] with c/o R sided abd pain.  Symptoms onset 12 days ago suddenly.  Symptoms are intermittent.  Pain interfering with sleep.  Last evening had severe pain which prompted him to come to ED today.  No N/V, no CP, no SOB.   ED Course: Creat 2.4 today.  HGB 9.7.  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 Extrinsic lymph node   Review of Systems: As per HPI, otherwise all review of systems negative.  Past Medical History:  Diagnosis Date   Anginal pain ([place])    Coronary artery disease    Diabetes mellitus without complication ([place])    Dysrhythmia    Hypertension    Sleep apnea     Past Surgical History:  Procedure Laterality Date   CARDIAC CATHETERIZATION     EYE SURGERY     INJECTION OF SILICONE OIL Left 79/48/0165   Procedure: INJECTION OF SILICONE OIL;  Surgeon: Sherlynn Stalls, MD;  Location: [place];  Service: Ophthalmology;  Laterality: Left;   INJECTION OF SILICONE OIL Left 5/37/4827   Procedure: INJECTION OF SILICONE OIL;  Surgeon: Sherlynn Stalls, MD;  Location: [place];  Service: Ophthalmology;  Laterality: Left;   JOINT REPLACEMENT Right    hip   LASER PHOTO ABLATION Left 12/25/2020   Procedure: LASER PHOTO ABLATION;  Surgeon: Sherlynn Stalls, MD;  Location: [place];  Service: Ophthalmology;  Laterality:  Left;   PARS PLANA VITRECTOMY Left 11/10/2020   Procedure: PARS PLANA VITRECTOMY WITH 25 GAUGE;  Surgeon: Sherlynn Stalls, MD;  Location: [name];  Service: Ophthalmology;  Laterality: Left;   PARS PLANA VITRECTOMY Left 12/25/2020   Procedure: PARS PLANA VITRECTOMY WITH 25 GAUGE IN LEFT EYE;  Surgeon: Sherlynn Stalls, MD;  Location: [place];  Service: Ophthalmology;  Laterality: Left;   PERFLUORONE INJECTION Left 12/25/2020   Procedure: PERFLUORONE INJECTION;  Surgeon: Sherlynn Stalls, MD;  Location: [place];  Service: Ophthalmology;  Laterality: Left;   PHOTOCOAGULATION WITH LASER Left 11/10/2020   Procedure: PHOTOCOAGULATION WITH LASER;  Surgeon: Sherlynn Stalls, MD;  Location: [place];  Service: Ophthalmology;  Laterality: Left;   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 He has never used smokeless tobacco. He reports previous alcohol use. He reports that he does not use drugs.  No Known Allergies  Family History  Problem Relation Age of Onset   CAD Other    Does have family history of early CAD  Prior to Admission medications   Medication Sig Start Date End Date Taking? Authorizing Provider  amiodarone (PACERONE) 200 MG tablet Take 200 mg by mouth daily. 04/02/21  Yes [provider]  atorvastatin (LIPITOR) 20 MG tablet Take 20 mg by mouth every evening. 11/04/20  Yes [provider]  clopidogrel (PLAVIX) 75 MG tablet Take 75 mg by mouth daily. 12/17/20  Yes [provider]  ELIQUIS 5 MG TABS tablet Take 5 mg by mouth 2 (two) times daily. 11/05/20  Yes [provider]  glipiZIDE (GLUCOTROL) 10 MG tablet Take 10 mg by  mouth 2 (two) times daily. 10/03/20  Yes [provider]  hydrochlorothiazide (HYDRODIURIL) 25 MG tablet Take 25 mg by mouth daily. 10/07/20  Yes [provider]  isosorbide mononitrate (IMDUR) 30 MG 24 hr tablet Take 30 mg by mouth daily. 09/08/20  Yes [provider]  LANTUS SOLOSTAR 100 UNIT/ML Solostar Pen Inject 34 Units into the skin every evening. 11/05/20  Yes [provider]  lisinopril (ZESTRIL) 20 MG tablet Take 20 mg by mouth daily. 04/06/21  Yes [provider]  metFORMIN (GLUCOPHAGE) 1000 MG tablet Take 1,000 mg by mouth in the morning and at bedtime.   Yes [provider]  metoprolol tartrate (LOPRESSOR) 25 MG tablet Take 25 mg by mouth 3 (three) times daily. (Morning, noon, evening) 10/23/20  Yes [provider]  nitroGLYCERIN (NITROSTAT) 0.3 MG SL tablet Place 0.3 mg under the tongue every 5 (five) minutes x 3 doses as needed for chest pain. 09/08/20  Yes [provider]  prednisoLONE acetate (PRED FORTE) 1 % ophthalmic suspension Place 1 drop into the left eye 2 (two) times daily. 11/25/20  Yes [provider]  terazosin (HYTRIN) 2 MG capsule Take 2 mg by mouth at bedtime. 10/03/20  Yes [provider]    Physical Exam: Vitals:   04/30/21 1645 04/30/21 1826 04/30/21 2026 04/30/21 2233  BP: (!) 158/81 (!) 146/82 (!) 161/79 (!) 165/78  Pulse: (!) 55 63 65 67  Resp: 16 16 18 20   Temp:   98.5 F (36.9 C) 97.9 F (36.6 C)  TempSrc:   Oral Oral  SpO2: 100% 100% 100% 99%  Weight:      Height:        Constitutional: NAD, calm, comfortable Eyes: PERRL, lids and conjunctivae normal ENMT: Mucous membranes are moist. Posterior pharynx clear of any exudate or lesions.Normal dentition.  Neck: normal, supple, no masses, no thyromegaly Respiratory: clear to auscultation bilaterally, no wheezing, no crackles. Normal respiratory effort. No accessory muscle use.  Cardiovascular: Regular rate and  rhythm, no murmurs / rubs / gallops. No extremity edema. 2+ pedal pulses. No carotid bruits.  Abdomen: no tenderness, no masses palpated. No hepatosplenomegaly. Bowel sounds positive.  Musculoskeletal: no clubbing / cyanosis. No joint deformity upper and lower extremities. Good ROM, no contractures. Normal muscle tone.  Skin: no rashes, lesions, ulcers. No induration Neurologic: CN 2-12 grossly intact. Sensation intact, DTR normal. Strength 5/5 in all 4.  Psychiatric: Normal judgment and insight. Alert and oriented x 3. Normal mood.    Labs on Admission: I have personally reviewed following labs and imaging studies  CBC: Recent Labs  Lab 04/30/21 1252  WBC 2.6*  HGB 9.7*  HCT 30.8*  MCV 80.8  PLT 294   Basic Metabolic Panel: Recent Labs  Lab 04/30/21 1252  NA 140  K 4.6  CL 108  CO2 23  GLUCOSE 157*  BUN 33*  CREATININE 2.40*  CALCIUM 8.8*   GFR: Estimated Creatinine Clearance: 34.6 mL/min (A) (by C-G formula based on SCr of 2.4 mg/dL (H)). Liver Function Tests: Recent Labs  Lab 04/30/21 1252  AST 10*  ALT 12  ALKPHOS 64  BILITOT 0.6  PROT 5.9*  ALBUMIN 3.7   Recent Labs  Lab 04/30/21 1252  LIPASE 13   No results for input(s): AMMONIA in the last 168 hours. Coagulation Profile: No results for input(s): INR, PROTIME in the last 168 hours. Cardiac Enzymes: No results for input(s): CKTOTAL, CKMB, CKMBINDEX, TROPONINI in the last 168 hours. BNP (last 3 results) No results for input(s): PROBNP in the last 8760 hours. HbA1C: No results for input(s): HGBA1C in the last 72 hours. CBG: Recent Labs  Lab 04/30/21 2030 04/30/21 2359  GLUCAP 94 104*   Lipid Profile: No results for input(s): CHOL, HDL, LDLCALC, TRIG, CHOLHDL, LDLDIRECT in the last 72 hours. Thyroid Function Tests: No results for input(s): TSH, T4TOTAL, FREET4, T3FREE, THYROIDAB in the last 72 hours. Anemia Panel: No results for input(s): VITAMINB12, FOLATE, FERRITIN, TIBC, IRON, RETICCTPCT in  the last 72 hours. Urine analysis:    Component Value Date/Time   COLORURINE YELLOW 04/30/2021 1242   APPEARANCEUR CLEAR 04/30/2021 1242   LABSPEC 1.017 04/30/2021 1242   PHURINE 5.5 04/30/2021 1242   GLUCOSEU NEGATIVE 04/30/2021 1242   HGBUR LARGE (A) 04/30/2021 1242   BILIRUBINUR NEGATIVE 04/30/2021 1242   KETONESUR NEGATIVE 04/30/2021 1242   PROTEINUR TRACE (A) 04/30/2021 1242   NITRITE NEGATIVE 04/30/2021 1242   LEUKOCYTESUR NEGATIVE 04/30/2021 1242    Radiological Exams on Admission: CT Abdomen Pelvis Wo Contrast  Result Date: 04/30/2021 CLINICAL DATA:  Right lower quadrant abdominal pain EXAM: CT ABDOMEN AND PELVIS WITHOUT CONTRAST TECHNIQUE: Multidetector CT imaging of the abdomen and pelvis was performed following the standard protocol without IV contrast. COMPARISON:  None. FINDINGS: Lower chest: 6 mm mean diameter noncalcified pulmonary nodule is seen within the visualized right middle lobe, indeterminate. Scattered ground-glass pulmonary infiltrates in the noted at the lung bases bilaterally with subtle, smooth interlobular septal thickening possibly representing changes of trace pulmonary edema, possibly cardiogenic in nature. Extensive multi-vessel coronary artery calcification is present with probable stenting of the distal right coronary artery. Global cardiac size within normal limits. No pericardial effusion. Hypoattenuation of the cardiac blood pool is in keeping with at least mild anemia. Hepatobiliary: No focal liver abnormality is seen. No gallstones, gallbladder wall thickening, or biliary dilatation. Pancreas: Unremarkable Spleen: There is moderate splenomegaly with the spleen measuring 17.2 cm in greatest dimension. No definite intrasplenic lesion identified on this noncontrast examination. Adrenals/Urinary Tract: The adrenal glands are unremarkable. The kidneys are normal in size and position. There is mild right hydronephrosis secondary to an obstructing lobulated mass  in the region of the right ureteropelvic junction measuring 3.1 x 4.5 x 3.8 cm on axial image # 58 and coronal image # 67 while difficult to accurately characterize in the absence of contrast administration, the mass appears external to the right ureter and demonstrates occlusion of the a proximal right ureter likely related to extrinsic mass effect, best appreciated on coronal imaging. Mild bilateral nonspecific perinephric stranding. No intrarenal or ureteral calculi are identified no hydronephrosis on the left. Appearing calcifications along the posterior wall of the bladder in the region of the ureteropelvic junction may represent posterior layering calculi within the bladder lumen or may be artifactual and related to streak artifact from right total hip arthroplasty. Stomach/Bowel: The stomach, small bowel, and large  bowel are unremarkable. No evidence of obstruction or focal inflammation. The appendix is normal. No free intraperitoneal gas or fluid. Vascular/Lymphatic: There is extensive mesenteric infiltration. Additionally, there is pathologic mesenteric adenopathy with the dominant mass measuring 6.7 x 4.1 cm at axial image # 44/2. Multiple additional pathologically enlarged mesenteric lymph nodes are identified demonstrating irregular margins. There is, additionally, pathologic bilateral common iliac and external iliac lymphadenopathy as well as right pelvic sidewall lymphadenopathy with the index right common iliac lymph node measuring 1.8 x 3.7 cm at axial image # 72/2. Borderline pathologic aortocaval and left periaortic lymphadenopathy is present. There is moderate aortoiliac atherosclerotic calcification. No aortic aneurysm. Reproductive: Mild prostatic enlargement. Seminal vesicles are unremarkable. Other: Soft tissue nodule within the right retroperitoneum adjacent to the right psoas muscle may represent pathologic retroperitoneal adenopathy or a metastatic implant. This measures 1.7 x 3.0 cm at  axial image # 63/2. 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 Degenerative changes are seen within the lumbar spine. No lytic or blastic bone lesions are identified. IMPRESSION: Pathologic mesenteric, retroperitoneal, and pelvic adenopathy, as described above. Moderate splenomegaly. Lobulated retroperitoneal soft tissue masses in the region of the right ureteropelvic junction likely resulting in mild right hydronephrosis secondary to extrinsic mass effect as well as within the right retroperitoneum. 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 Less likely, this may represent metastatic disease secondary to a primary urothelial malignancy or potentially metastatic disease related to underlying prostate cancer, however, the nodal distribution would be unusual for the former and the lack of osseous metastatic disease given the extensive nodal involvement would be unusual for the latter. PET CT examination may be more helpful for further evaluation as well as identification of a a optimal tissue sampling target. Pathologic right external iliac lymphadenopathy may be amenable to ultrasound-guided sampling for definitive diagnosis. 6 mm of right middle lobe pulmonary nodule. Non-contrast chest CT at 6-12 months is recommended. If the nodule is stable at time of repeat CT, then future CT at 18-24 months (from today's scan) is considered optional for low-risk patients, but is recommended for high-risk patients. This recommendation follows the consensus statement: Guidelines for Management of Incidental Pulmonary Nodules Detected on CT Images: From the Fleischner Society 2017; Radiology 2017; 284:228-243. Extensive coronary artery calcification. Scattered ground-glass pulmonary infiltrates and subtle smooth interlobular septal thickening may reflect changes of mild cardiogenic failure. At least mild anemia. Aortic Atherosclerosis (ICD10-I70.0). Electronically Signed   By: Fidela Salisbury MD   On: 04/30/2021 14:38    EKG: Independently reviewed.  Assessment/Plan Principal Problem:   Lymphadenopathy Active Problems:   Hydronephrosis of right kidney   AKI (acute kidney injury) ([place])   CAD (coronary artery disease)   DM2 (diabetes mellitus, type 2) ([facility])   PAF (paroxysmal atrial fibrillation) ([facility])   HTN (hypertension)    Lymphadenopathy / abdominal masses - Concerning for neoplasm, radiologist favors lymphoma though urologic tumor also possible still. Need to address AKI / R hydro Need to get tissue biopsy for diagnosis AKI - Doesn't look pre-renal, is currently assumed to be related to R hydro nephrosis Gentle hydration with LR at 125 Strict intake and output Hold lisinopril and other nephrotoxic meds Given presence of hydronephrosis + AKI -> urology plans to take to OR later today for cystoscopy and attempt at ureteral stent placement NPO Holding eliquis and plavix for the moment Biopsy planning - Call IR in AM and discuss timing of biopsy, how long does he need to be off  of anticoags for before biopsy, ? Bridging heparin gtt. CAD s/p Stent most recently in Jan - Plavix on hold for the moment (as is eliquis). Decide after cystoscopy tomorrow with IR discussion wether / when to restart vs start bridging heparin gtt Cont Statin and BB PAF - Cont amiodarone and BB Holding eliquis as above HTN - Cont BB, Imdur Hold lisinopril DM2 - Holding metformin and lantus Sensitive SSI Q4H while NPO  DVT prophylaxis: SCDs Code Status: Full Family Communication: Daughter Derrick Ravel acts as Optometrist over phone; daughter is a Paediatric nurse up in [place]. Phone # [phone] Disposition Plan: Home after renal recovery, and tissue biopsy Consults called: Dr. [name] at bedside Admission status: Admit to inpatient  Severity of Illness: The appropriate patient status for this patient is INPATIENT. Inpatient status is judged to be reasonable and necessary in  order to provide the required intensity of service to ensure the patient's safety. The patient's presenting symptoms, physical exam findings, and initial radiographic and laboratory data in the context of their chronic comorbidities is felt to place them at high risk for further clinical deterioration. Furthermore, it is not anticipated that the patient will be medically stable for discharge from the hospital within 2 midnights of admission. The following factors support the patient status of inpatient.   Patient has acute kidney injury.  Patient has one of the following: Increase in Serum Creatinine >0.3 mg/dL within 48h Increase in Serum Creatinine > 1.5 times baseline known or presumed to have been within the last 7 days Urine volume < 0.5 ml/kg/hr for 6 hours   IP status for: 1) AKI and associated R sided hydronephrosis 2) need for tissue diagnoses of multiple intra-abdominal masses, presumably new diagnosis of cancer in patient on chronic blood thinners.   * I certify that at the point of admission it is my clinical judgment that the patient will require inpatient hospital care spanning beyond 2 midnights from the point of admission due to high intensity of service, high risk for further deterioration and high frequency of surveillance required.*   Tajee Savant M. DO [facility]  How to contact the [facility] Attending or Consulting provider [place] or covering provider during after hours [place], for this patient?  Check the care team in [facility] and look for a) attending/consulting [facility] provider listed and b) the [facility] team listed Log into [email or web address]  [employer] Physician Scheduling and messaging for groups and whole hospitals  On call and physician scheduling software for group practices, residents, hospitalists and other medical providers for call, clinic, rotation and shift schedules. On[employer] is a hospital-wide system for scheduling doctors and paging doctors on call. EasyPlot is  for scientific plotting and data analysis.  [email or web address]  and use [facility]'s universal password to access. If you do not have the password, please contact the hospital operator.  Locate the [facility] provider you are looking for under [facility] and page to a number that you can be directly reached. If you still have difficulty reaching the provider, please page the [facility] (Director on Call) for the Hospitalists listed on amion for assistance.  05/01/2021, 12:09 AM

## 2021-05-01 NOTE — Progress Notes (Signed)
PROGRESS NOTE    [name]  JKK:[id number] DOB: February 23, 1946 DOA: 04/30/2021 PCP: [name], MD   Chief Complain: Abdominal pain  Brief Narrative: Patient is a 75 year old male with history of diabetes type 2, hypertension, coronary disease status post PCI with a stent placement in January of this year, on Plavix, paroxysmal A. fib on Eliquis and amiodarone, diabetes type 2, CKD stage IIIa who presents to the emergency department with complaints of right-sided abdominal pain.  Symptoms started about 2 weeks ago.  On presentation his creatinine was found to be 2.4.  Hemoglobin was 9.7.  Abdominal CT showed multiple lymphadenopathy suspicious for lymphoma, right hydronephrosis due to possible right ureteral mass.  Urology consulted and he underwent cystoscopy with right-sided ureteral stent placement on 05/01/21.  IR consulted for biopsy of the lymph node to rule out lymphoma  Assessment & Plan:   Principal Problem:   Lymphadenopathy Active Problems:   Hydronephrosis of right kidney   AKI (acute kidney injury) ([place])   CAD (coronary artery disease)   DM2 (diabetes mellitus, type 2) ([facility])   PAF (paroxysmal atrial fibrillation) ([facility])   HTN (hypertension)   Right-sided hydronephrosis: Underwent right-sided ureteral stent placement by urology.  Foley has been placed.  We will follow-up with surgical pathology.  Red urine being drained through the Foley. Urology following.  AKI on CKD stage IIIa: Last creatinine was 1.4 as per Feb 22.  Presented with creatinine of 2.4.  Likely associated with obstructive pathology, he did not look dehydrated.  Continue Foley catheter for now.  Monitor input/output.  We will continue to monitor kidney function continue gentle IV fluids.  Also on lisinopril at home which has been held.  Avoid nephrotoxins.  Pelvic masses/abdominal lymphadenopathy:Pathologic mesenteric, retroperitoneal, and pelvic adenopathy. We wil consult IR for possible CT-guided  biopsy from the lymph node/ultrasound-guided biopsy.Plan for Monday  History of coronary artery disease: Takes Plavix at home.  Stent was recently placed in January.  We will continue to hold Plavix for possible upcoming biopsy procedure but might consider putting him on heparin drip from tomorrow but will hold for today because he is still having sanguinous urine  Paroxysmal A. fib: Currently rate is well controlled.  On Eliquis for anticoagulation.  Takes beta-blocker and amiodarone.  Eliquis/Plavix currently on hold for possible upcoming biopsy procedure.Will start heparin tomorrowHe is currently in normal sinus rhythm  Hypertension: Continue current medications.  Lisinopril on hold.  Monitor blood pressure  Diabetes type 2: On metformin, Lantus at home.  Currently on sliding scale insulin   Lung nodule: 6 mm of right middle lobe pulmonary nodule.  Needs follow-up as an outpatient     DVT prophylaxis: SCD Code Status: Full code Family Communication:: Discussed with son on phone Status is: Inpatient  Remains inpatient appropriate because:Unsafe d/c plan  Dispo: The patient is from: Home              Anticipated d/c is to: Home              Patient currently is not medically stable to d/c.   Difficult to place patient No     Consultants: urology  Procedures:stent placement  Antimicrobials:  Anti-infectives (From admission, onward)    Start     Dose/Rate Route Frequency Ordered Stop   05/01/21 1128  ceFAZolin (ANCEF) 2-4 GM/100ML-% IVPB       Note to Pharmacy: Bridget Hartshorn   : cabinet override      05/01/21 1128 05/01/21 1248  Objective: Vitals:   05/01/21 1200 05/01/21 1215 05/01/21 1230 05/01/21 1249  BP: 133/69 (!) 155/83 140/80 (!) 160/91  Pulse: (!) 56 61 61 61  Resp: 19 20 15 16   Temp:   98.4 F (36.9 C) 97.8 F (36.6 C)  TempSrc:    Oral  SpO2: 99% 100% 100% 98%  Weight:      Height:        Intake/Output Summary (Last 24 hours) at  05/01/2021 1355 Last data filed at 05/01/2021 1230 Gross per 24 hour  Intake 2080.76 ml  Output 1302 ml  Net 778.76 ml   Filed Weights   04/30/21 1240  Weight: 113.4 kg    Examination:  General exam: Appears calm and comfortable ,Not in distress,obese Respiratory system: Bilateral equal air entry, normal vesicular breath sounds, no wheezes or crackles  Cardiovascular system: S1 & S2 heard, RRR. No JVD, murmurs, rubs, gallops or clicks. No pedal edema. Gastrointestinal system: Abdomen is nondistended, soft and nontender. No organomegaly or masses felt. Normal bowel sounds heard. Central nervous system: Alert and oriented. No focal neurological deficits. Extremities: No edema, no clubbing ,no cyanosis, distal peripheral pulses palpable. Skin: No rashes, lesions or ulcers,no icterus ,no pallor GU : Foley with sanguinous urine   Data Reviewed: I have personally reviewed following labs and imaging studies  CBC: Recent Labs  Lab 04/30/21 1252 05/01/21 0515  WBC 2.6* 2.0*  HGB 9.7* 9.7*  HCT 30.8* 30.4*  MCV 80.8 81.3  PLT 162 938   Basic Metabolic Panel: Recent Labs  Lab 04/30/21 1252 05/01/21 0515  NA 140 139  K 4.6 4.4  CL 108 109  CO2 23 21*  GLUCOSE 157* 123*  BUN 33* 31*  CREATININE 2.40* 2.45*  CALCIUM 8.8* 8.7*   GFR: Estimated Creatinine Clearance: 33.9 mL/min (A) (by C-G formula based on SCr of 2.45 mg/dL (H)). Liver Function Tests: Recent Labs  Lab 04/30/21 1252  AST 10*  ALT 12  ALKPHOS 64  BILITOT 0.6  PROT 5.9*  ALBUMIN 3.7   Recent Labs  Lab 04/30/21 1252  LIPASE 13   No results for input(s): AMMONIA in the last 168 hours. Coagulation Profile: No results for input(s): INR, PROTIME in the last 168 hours. Cardiac Enzymes: No results for input(s): CKTOTAL, CKMB, CKMBINDEX, TROPONINI in the last 168 hours. BNP (last 3 results) No results for input(s): PROBNP in the last 8760 hours. HbA1C: No results for input(s): HGBA1C in the last 72  hours. CBG: Recent Labs  Lab 04/30/21 2359 05/01/21 0356 05/01/21 0726 05/01/21 0946 05/01/21 1246  GLUCAP 104* 120* 130* 127* 139*   Lipid Profile: No results for input(s): CHOL, HDL, LDLCALC, TRIG, CHOLHDL, LDLDIRECT in the last 72 hours. Thyroid Function Tests: No results for input(s): TSH, T4TOTAL, FREET4, T3FREE, THYROIDAB in the last 72 hours. Anemia Panel: No results for input(s): VITAMINB12, FOLATE, FERRITIN, TIBC, IRON, RETICCTPCT in the last 72 hours. Sepsis Labs: No results for input(s): PROCALCITON, LATICACIDVEN in the last 168 hours.  Recent Results (from the past 240 hour(s))  Resp Panel by RT-PCR (Flu A&B, Covid) Nasopharyngeal Swab     Status: None   Collection Time: 04/30/21  2:58 PM   Specimen: Nasopharyngeal Swab; Nasopharyngeal(NP) swabs in vial transport medium  Result Value Ref Range Status   SARS Coronavirus 2 by RT PCR NEGATIVE NEGATIVE Final    Comment: (NOTE) SARS-CoV-2 target nucleic acids are NOT DETECTED.  The SARS-CoV-2 RNA is generally detectable in upper respiratory specimens during the acute phase  of infection. The lowest concentration of SARS-CoV-2 viral copies this assay can detect is 138 copies/mL. A negative result does not preclude SARS-Cov-2 infection and should not be used as the sole basis for treatment or other patient management decisions. A negative result may occur with  improper specimen collection/handling, submission of specimen other than nasopharyngeal swab, presence of viral mutation(s) within the areas targeted by this assay, and inadequate number of viral copies(<138 copies/mL). A negative result must be combined with clinical observations, patient history, and epidemiological information. The expected result is Negative.  Fact Sheet for Patients:  EntrepreneurPulse.com.au  Fact Sheet for Healthcare Providers:  IncredibleEmployment.be  This test is no t yet approved or cleared by  the [place] FDA and  has been authorized for detection and/or diagnosis of SARS-CoV-2 by FDA under an Emergency Use Authorization (EUA). This EUA will remain  in effect (meaning this test can be used) for the duration of the COVID-19 declaration under Section 564(b)(1) of the Act, 21 U.S.C.section 360bbb-3(b)(1), unless the authorization is terminated  or revoked sooner.       Influenza A by PCR NEGATIVE NEGATIVE Final   Influenza B by PCR NEGATIVE NEGATIVE Final    Comment: (NOTE) The Xpert Xpress SARS-CoV-2/FLU/RSV plus assay is intended as an aid in the diagnosis of influenza from Nasopharyngeal swab specimens and should not be used as a sole basis for treatment. Nasal washings and aspirates are unacceptable for Xpert Xpress SARS-CoV-2/FLU/RSV testing.  Fact Sheet for Patients: EntrepreneurPulse.com.au  Fact Sheet for Healthcare Providers: IncredibleEmployment.be  This test is not yet approved or cleared by the [place] FDA and has been authorized for detection and/or diagnosis of SARS-CoV-2 by FDA under an Emergency Use Authorization (EUA). This EUA will remain in effect (meaning this test can be used) for the duration of the COVID-19 declaration under Section 564(b)(1) of the Act, 21 U.S.C. section 360bbb-3(b)(1), unless the authorization is terminated or revoked.  Performed at [facility], [address] [place], [place] [address]          Radiology Studies: CT Abdomen Pelvis Wo Contrast  Result Date: 04/30/2021 CLINICAL DATA:  Right lower quadrant abdominal pain EXAM: CT ABDOMEN AND PELVIS WITHOUT CONTRAST TECHNIQUE: Multidetector CT imaging of the abdomen and pelvis was performed following the standard protocol without IV contrast. COMPARISON:  None. FINDINGS: Lower chest: 6 mm mean diameter noncalcified pulmonary nodule is seen within the visualized right middle lobe, indeterminate. Scattered  ground-glass pulmonary infiltrates in the noted at the lung bases bilaterally with subtle, smooth interlobular septal thickening possibly representing changes of trace pulmonary edema, possibly cardiogenic in nature. Extensive multi-vessel coronary artery calcification is present with probable stenting of the distal right coronary artery. Global cardiac size within normal limits. No pericardial effusion. Hypoattenuation of the cardiac blood pool is in keeping with at least mild anemia. Hepatobiliary: No focal liver abnormality is seen. No gallstones, gallbladder wall thickening, or biliary dilatation. Pancreas: Unremarkable Spleen: There is moderate splenomegaly with the spleen measuring 17.2 cm in greatest dimension. No definite intrasplenic lesion identified on this noncontrast examination. Adrenals/Urinary Tract: The adrenal glands are unremarkable. The kidneys are normal in size and position. There is mild right hydronephrosis secondary to an obstructing lobulated mass in the region of the right ureteropelvic junction measuring 3.1 x 4.5 x 3.8 cm on axial image # 58 and coronal image # 67 while difficult to accurately characterize in the absence of contrast administration, the mass appears external to the right ureter and  demonstrates occlusion of the a proximal right ureter likely related to extrinsic mass effect, best appreciated on coronal imaging. Mild bilateral nonspecific perinephric stranding. No intrarenal or ureteral calculi are identified no hydronephrosis on the left. Appearing calcifications along the posterior wall of the bladder in the region of the ureteropelvic junction may represent posterior layering calculi within the bladder lumen or may be artifactual and related to streak artifact from right total hip arthroplasty. Stomach/Bowel: The stomach, small bowel, and large bowel are unremarkable. No evidence of obstruction or focal inflammation. The appendix is normal. No free intraperitoneal gas  or fluid. Vascular/Lymphatic: There is extensive mesenteric infiltration. Additionally, there is pathologic mesenteric adenopathy with the dominant mass measuring 6.7 x 4.1 cm at axial image # 44/2. Multiple additional pathologically enlarged mesenteric lymph nodes are identified demonstrating irregular margins. There is, additionally, pathologic bilateral common iliac and external iliac lymphadenopathy as well as right pelvic sidewall lymphadenopathy with the index right common iliac lymph node measuring 1.8 x 3.7 cm at axial image # 72/2. Borderline pathologic aortocaval and left periaortic lymphadenopathy is present. There is moderate aortoiliac atherosclerotic calcification. No aortic aneurysm. Reproductive: Mild prostatic enlargement. Seminal vesicles are unremarkable. Other: Soft tissue nodule within the right retroperitoneum adjacent to the right psoas muscle may represent pathologic retroperitoneal adenopathy or a metastatic implant. This measures 1.7 x 3.0 cm at axial image # 63/2. 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 Degenerative changes are seen within the lumbar spine. No lytic or blastic bone lesions are identified. IMPRESSION: Pathologic mesenteric, retroperitoneal, and pelvic adenopathy, as described above. Moderate splenomegaly. Lobulated retroperitoneal soft tissue masses in the region of the right ureteropelvic junction likely resulting in mild right hydronephrosis secondary to extrinsic mass effect as well as within the right retroperitoneum. 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 Less likely, this may represent metastatic disease secondary to a primary urothelial malignancy or potentially metastatic disease related to underlying prostate cancer, however, the nodal distribution would be unusual for the former and the lack of osseous metastatic disease given the extensive nodal involvement would be unusual for the latter. PET CT examination may be  more helpful for further evaluation as well as identification of a a optimal tissue sampling target. Pathologic right external iliac lymphadenopathy may be amenable to ultrasound-guided sampling for definitive diagnosis. 6 mm of right middle lobe pulmonary nodule. Non-contrast chest CT at 6-12 months is recommended. If the nodule is stable at time of repeat CT, then future CT at 18-24 months (from today's scan) is considered optional for low-risk patients, but is recommended for high-risk patients. This recommendation follows the consensus statement: Guidelines for Management of Incidental Pulmonary Nodules Detected on CT Images: From the Fleischner Society 2017; Radiology 2017; 284:228-243. Extensive coronary artery calcification. Scattered ground-glass pulmonary infiltrates and subtle smooth interlobular septal thickening may reflect changes of mild cardiogenic failure. At least mild anemia. Aortic Atherosclerosis (ICD10-I70.0). Electronically Signed   By: Fidela Salisbury MD   On: 04/30/2021 14:38   DG C-Arm 1-60 Min-No Report  Result Date: 05/01/2021 Fluoroscopy was utilized by the requesting physician.  No radiographic interpretation.        Scheduled Meds:  amiodarone  200 mg Oral Daily   atorvastatin  20 mg Oral QPM   insulin aspart  0-9 Units Subcutaneous Q4H   isosorbide mononitrate  30 mg Oral Daily   metoprolol tartrate  25 mg Oral TID   prednisoLONE acetate  1 drop Left Eye BID   terazosin  2 mg Oral QHS   Continuous Infusions:  lactated ringers 125 mL/hr at 05/01/21 0942     LOS: 1 day    Time spent: 35 mins.More than 50% of that time was spent in counseling and/or coordination of care.      Shelly Coss, MD [facility] P6/17/2022, 1:55 PM

## 2021-05-01 NOTE — Anesthesia Procedure Notes (Addendum)
Procedure Name: LMA Insertion Date/Time: 05/01/2021 11:21 AM Performed by: Eben Burow, CRNA Pre-anesthesia Checklist: Patient identified, Emergency Drugs available, Suction available, Patient being monitored and Timeout performed Patient Re-evaluated:Patient Re-evaluated prior to induction Oxygen Delivery Method: Circle system utilized Preoxygenation: Pre-oxygenation with 100% oxygen Induction Type: IV induction Ventilation: Mask ventilation without difficulty LMA: LMA inserted LMA Size: 4.0 Number of attempts: 1 Tube secured with: Tape Dental Injury: Teeth and Oropharynx as per pre-operative assessment

## 2021-05-01 NOTE — Op Note (Signed)
Preoperative diagnosis: Right hydronephrosis, retroperitoneal masses Postoperative diagnosis: Same  Procedure: Cystoscopy right retrograde pyelogram right ureteral stent placement, Foley catheter placement  Surgeon: Junious Silk  Anesthesia: General  Indication for procedure: [name] is a 75 year old male who presented with acute renal failure and right hydronephrosis.  He had multiple retroperitoneal lymphadenopathy and masses.  He was brought today for diagnostic retrograde and right ureteral stent placement in hopes to improve kidney function.  Findings: On exam under anesthesia the penis was uncircumcised but no foreskin mass or lesion.  No phimosis.  The glans and meatus appeared normal.  Testicles descended bilaterally and palpably normal.  On digital rectal exam the prostate was about 40 g and smooth without hard area or nodule.  On cystoscopy the urethra was unremarkable apart from some narrowing in the proximal bulb which the scope dilated.  Prostate obstructed by some lateral lobe hypertrophy and a small median lobe with moderate bladder trabeculation.  Many tiny uric acid/yellow stones drained.  Ureteral orifices appeared normal with clear reflux.  No mucosal lesions.  Right retrograde pyelogram-this outlined a single ureter single collecting system unit.  There distal and mid ureter appeared normal.  There was some dilation of the distal portion of the proximal ureter and then the ureter took a slight deviation laterally then a sharp deviation medially and sloped back medially toward the renal pelvis. Ureter, renal pelvis and collecting system without obvious filling defect.  All of this looked consistent with external compression. Due to contrast shortage there was only 110 mL syringe of contrast remaining in the operating room.  Description of procedure: After consent was obtained patient brought to the operating room.  After adequate anesthesia he was placed in lithotomy position and  prepped and draped in the usual sterile fashion.  Timeout was performed to confirm the patient and procedure.  Cystoscope was passed per urethra and the bladder carefully inspected with a 30 degree and 70 degree lens.  6 [place] open-ended catheter was used to inject contrast retrograde after cannulating the right ureteral orifice.  Sensor wire was then advanced without difficulty into the upper calyx and a 626 cm stent advanced.  The wire was removed with a good coil seen in the upper calyx and a good coil in the bladder.  Scope was backed out and an 84 [place] Foley catheter placed for max drainage and to measure urine output.  Exam under anesthesia and DRE was performed.  He was awakened taken the cover room in stable condition.  Complications: None  Blood loss: Minimal  Specimens: None  Drains: 6 x 26 cm right ureteral stent, 18 [place] Foley catheter  Disposition: Patient stable to PACU-I discussed procedure, postop course and follow-up with his daughter Alma Friendly.

## 2021-05-01 NOTE — Transfer of Care (Signed)
Immediate Anesthesia Transfer of Care Note  Patient: [name]  Procedure(s) Performed: CYSTOSCOPY WITH RIGHT  RETROGRADE PYELOGRAM,  AND RIGHT STENT PLACEMENT (Right: Ureter)  Patient Location: PACU  Anesthesia Type:General  Level of Consciousness: oriented, drowsy and patient cooperative  Airway & Oxygen Therapy: Patient Spontanous Breathing and Patient connected to face mask oxygen  Post-op Assessment: Report given to RN and Post -op Vital signs reviewed and stable  Post vital signs: Reviewed and stable  Last Vitals:  Vitals Value Taken Time  BP 134/68 05/01/21 1158  Temp    Pulse 56 05/01/21 1200  Resp 19 05/01/21 1200  SpO2 99 % 05/01/21 1200  Vitals shown include unvalidated device data.  Last Pain:  Vitals:   05/01/21 0941  TempSrc:   PainSc: 3          Complications: No notable events documented.

## 2021-05-02 DIAGNOSIS — R591 Generalized enlarged lymph nodes: Secondary | ICD-10-CM | POA: Diagnosis not present

## 2021-05-02 LAB — CBC WITH DIFFERENTIAL/PLATELET
Abs Immature Granulocytes: 0.02 10*3/uL (ref 0.00–0.07)
Basophils Absolute: 0 10*3/uL (ref 0.0–0.1)
Basophils Relative: 0 %
Eosinophils Absolute: 0 10*3/uL (ref 0.0–0.5)
Eosinophils Relative: 0 %
HCT: 34.7 % — ABNORMAL LOW (ref 39.0–52.0)
Hemoglobin: 11.1 g/dL — ABNORMAL LOW (ref 13.0–17.0)
Immature Granulocytes: 0 %
Lymphocytes Relative: 24 %
Lymphs Abs: 1.2 10*3/uL (ref 0.7–4.0)
MCH: 26 pg (ref 26.0–34.0)
MCHC: 32 g/dL (ref 30.0–36.0)
MCV: 81.3 fL (ref 80.0–100.0)
Monocytes Absolute: 1 10*3/uL (ref 0.1–1.0)
Monocytes Relative: 21 %
Neutro Abs: 2.7 10*3/uL (ref 1.7–7.7)
Neutrophils Relative %: 55 %
Platelets: 189 10*3/uL (ref 150–400)
RBC: 4.27 MIL/uL (ref 4.22–5.81)
RDW: 15.2 % (ref 11.5–15.5)
WBC: 4.9 10*3/uL (ref 4.0–10.5)
nRBC: 0 % (ref 0.0–0.2)

## 2021-05-02 LAB — SODIUM, URINE, RANDOM: Sodium, Ur: 38 mmol/L

## 2021-05-02 LAB — GLUCOSE, CAPILLARY
Glucose-Capillary: 208 mg/dL — ABNORMAL HIGH (ref 70–99)
Glucose-Capillary: 200 mg/dL — ABNORMAL HIGH (ref 70–99)
Glucose-Capillary: 215 mg/dL — ABNORMAL HIGH (ref 70–99)
Glucose-Capillary: 216 mg/dL — ABNORMAL HIGH (ref 70–99)
Glucose-Capillary: 198 mg/dL — ABNORMAL HIGH (ref 70–99)

## 2021-05-02 LAB — BASIC METABOLIC PANEL
Anion gap: 6 (ref 5–15)
BUN: 37 mg/dL — ABNORMAL HIGH (ref 8–23)
CO2: 26 mmol/L (ref 22–32)
Calcium: 9 mg/dL (ref 8.9–10.3)
Chloride: 106 mmol/L (ref 98–111)
Creatinine, Ser: 2.58 mg/dL — ABNORMAL HIGH (ref 0.61–1.24)
GFR, Estimated: 25 mL/min — ABNORMAL LOW (ref >60)
Glucose, Bld: 228 mg/dL — ABNORMAL HIGH (ref 70–99)
Potassium: 4.5 mmol/L (ref 3.5–5.1)
Sodium: 138 mmol/L (ref 135–145)

## 2021-05-02 LAB — PROTIME-INR
INR: 1.5 — ABNORMAL HIGH (ref 0.8–1.2)
Prothrombin Time: 18.2 seconds — ABNORMAL HIGH (ref 11.4–15.2)

## 2021-05-02 LAB — APTT: aPTT: 36 seconds (ref 24–36)

## 2021-05-02 LAB — HEPARIN LEVEL (UNFRACTIONATED): Heparin Unfractionated: 0.92 IU/mL — ABNORMAL HIGH (ref 0.30–0.70)

## 2021-05-02 LAB — CREATININE, URINE, RANDOM: Creatinine, Urine: 95.25 mg/dL

## 2021-05-02 MED ORDER — SODIUM CHLORIDE 0.9 % IV BOLUS
250.0000 mL | Freq: Once | INTRAVENOUS | Status: AC
  Administered 2021-05-02: 250 mL via INTRAVENOUS

## 2021-05-02 MED ORDER — SODIUM CHLORIDE 0.9 % IV SOLN
2.0000 g | Freq: Every day | INTRAVENOUS | Status: DC
  Administered 2021-05-02 – 2021-05-03 (×2): 2 g via INTRAVENOUS
  Filled 2021-05-02: qty 2
  Filled 2021-05-02: qty 20
  Filled 2021-05-02: qty 2

## 2021-05-02 MED ORDER — CHLORHEXIDINE GLUCONATE CLOTH 2 % EX PADS
6.0000 | MEDICATED_PAD | Freq: Every day | CUTANEOUS | Status: DC
  Administered 2021-05-02 – 2021-05-05 (×4): 6 via TOPICAL

## 2021-05-02 MED ORDER — HEPARIN (PORCINE) 25000 UT/250ML-% IV SOLN
1800.0000 [IU]/h | INTRAVENOUS | Status: DC
  Administered 2021-05-02: 1300 [IU]/h via INTRAVENOUS
  Administered 2021-05-03: 1800 [IU]/h via INTRAVENOUS
  Administered 2021-05-03: 1500 [IU]/h via INTRAVENOUS
  Filled 2021-05-02 (×3): qty 250

## 2021-05-02 MED ORDER — SODIUM CHLORIDE 0.9 % IV SOLN
INTRAVENOUS | Status: DC
  Administered 2021-05-04: 1000 mL via INTRAVENOUS

## 2021-05-02 NOTE — Progress Notes (Signed)
1 Day Post-Op Subjective: Urine output improved to 3.7L after right ureteral stent placement and foley placement. Creatinine 2.58 which did not improve  Objective: Vital signs in last 24 hours: Temp:  [97.7 F (36.5 C)-98.4 F (36.9 C)] 97.8 F (36.6 C) (06/18 0317) Pulse Rate:  [56-67] 64 (06/18 0317) Resp:  [15-20] 18 (06/18 0317) BP: (131-170)/(67-91) 154/79 (06/18 0317) SpO2:  [98 %-100 %] 99 % (06/18 0317)  Intake/Output from previous day: 06/17 0701 - 06/18 0700 In: 550 [I.V.:550] Out: 3752 [Urine:3750; Blood:2] Intake/Output this shift: No intake/output data recorded.  Physical Exam:  General:alert, cooperative, and appears stated age GI: soft, non tender, normal bowel sounds, no palpable masses, no organomegaly, no inguinal hernia Male genitalia: not done Extremities: extremities normal, atraumatic, no cyanosis or edema  Lab Results: Recent Labs    04/30/21 1252 05/01/21 0515 05/02/21 0629  HGB 9.7* 9.7* 11.1*  HCT 30.8* 30.4* 34.7*   BMET Recent Labs    05/01/21 0515 05/02/21 0629  NA 139 138  K 4.4 4.5  CL 109 106  CO2 21* 26  GLUCOSE 123* 228*  BUN 31* 37*  CREATININE 2.45* 2.58*  CALCIUM 8.7* 9.0   Recent Labs    05/02/21 0629  INR 1.5*   No results for input(s): LABURIN in the last 72 hours. Results for orders placed or performed during the hospital encounter of 04/30/21  Resp Panel by RT-PCR (Flu A&B, Covid) Nasopharyngeal Swab     Status: None   Collection Time: 04/30/21  2:58 PM   Specimen: Nasopharyngeal Swab; Nasopharyngeal(NP) swabs in vial transport medium  Result Value Ref Range Status   SARS Coronavirus 2 by RT PCR NEGATIVE NEGATIVE Final    Comment: (NOTE) SARS-CoV-2 target nucleic acids are NOT DETECTED.  The SARS-CoV-2 RNA is generally detectable in upper respiratory specimens during the acute phase of infection. The lowest concentration of SARS-CoV-2 viral copies this assay can detect is 138 copies/mL. A negative result  does not preclude SARS-Cov-2 infection and should not be used as the sole basis for treatment or other patient management decisions. A negative result may occur with  improper specimen collection/handling, submission of specimen other than nasopharyngeal swab, presence of viral mutation(s) within the areas targeted by this assay, and inadequate number of viral copies(<138 copies/mL). A negative result must be combined with clinical observations, patient history, and epidemiological information. The expected result is Negative.  Fact Sheet for Patients:  EntrepreneurPulse.com.au  Fact Sheet for Healthcare Providers:  IncredibleEmployment.be  This test is no t yet approved or cleared by the [place] FDA and  has been authorized for detection and/or diagnosis of SARS-CoV-2 by FDA under an Emergency Use Authorization (EUA). This EUA will remain  in effect (meaning this test can be used) for the duration of the COVID-19 declaration under Section 564(b)(1) of the Act, 21 U.S.C.section 360bbb-3(b)(1), unless the authorization is terminated  or revoked sooner.       Influenza A by PCR NEGATIVE NEGATIVE Final   Influenza B by PCR NEGATIVE NEGATIVE Final    Comment: (NOTE) The Xpert Xpress SARS-CoV-2/FLU/RSV plus assay is intended as an aid in the diagnosis of influenza from Nasopharyngeal swab specimens and should not be used as a sole basis for treatment. Nasal washings and aspirates are unacceptable for Xpert Xpress SARS-CoV-2/FLU/RSV testing.  Fact Sheet for Patients: EntrepreneurPulse.com.au  Fact Sheet for Healthcare Providers: IncredibleEmployment.be  This test is not yet approved or cleared by the [place] and has been authorized for  detection and/or diagnosis of SARS-CoV-2 by FDA under an Emergency Use Authorization (EUA). This EUA will remain in effect (meaning this test can be used) for  the duration of the COVID-19 declaration under Section 564(b)(1) of the Act, 21 U.S.C. section 360bbb-3(b)(1), unless the authorization is terminated or revoked.  Performed at [facility], [address], [place],  [address]     Studies/Results: CT Abdomen Pelvis Wo Contrast  Result Date: 04/30/2021 CLINICAL DATA:  Right lower quadrant abdominal pain EXAM: CT ABDOMEN AND PELVIS WITHOUT CONTRAST TECHNIQUE: Multidetector CT imaging of the abdomen and pelvis was performed following the standard protocol without IV contrast. COMPARISON:  None. FINDINGS: Lower chest: 6 mm mean diameter noncalcified pulmonary nodule is seen within the visualized right middle lobe, indeterminate. Scattered ground-glass pulmonary infiltrates in the noted at the lung bases bilaterally with subtle, smooth interlobular septal thickening possibly representing changes of trace pulmonary edema, possibly cardiogenic in nature. Extensive multi-vessel coronary artery calcification is present with probable stenting of the distal right coronary artery. Global cardiac size within normal limits. No pericardial effusion. Hypoattenuation of the cardiac blood pool is in keeping with at least mild anemia. Hepatobiliary: No focal liver abnormality is seen. No gallstones, gallbladder wall thickening, or biliary dilatation. Pancreas: Unremarkable Spleen: There is moderate splenomegaly with the spleen measuring 17.2 cm in greatest dimension. No definite intrasplenic lesion identified on this noncontrast examination. Adrenals/Urinary Tract: The adrenal glands are unremarkable. The kidneys are normal in size and position. There is mild right hydronephrosis secondary to an obstructing lobulated mass in the region of the right ureteropelvic junction measuring 3.1 x 4.5 x 3.8 cm on axial image # 58 and coronal image # 67 while difficult to accurately characterize in the absence of contrast administration, the mass appears  external to the right ureter and demonstrates occlusion of the a proximal right ureter likely related to extrinsic mass effect, best appreciated on coronal imaging. Mild bilateral nonspecific perinephric stranding. No intrarenal or ureteral calculi are identified no hydronephrosis on the left. Appearing calcifications along the posterior wall of the bladder in the region of the ureteropelvic junction may represent posterior layering calculi within the bladder lumen or may be artifactual and related to streak artifact from right total hip arthroplasty. Stomach/Bowel: The stomach, small bowel, and large bowel are unremarkable. No evidence of obstruction or focal inflammation. The appendix is normal. No free intraperitoneal gas or fluid. Vascular/Lymphatic: There is extensive mesenteric infiltration. Additionally, there is pathologic mesenteric adenopathy with the dominant mass measuring 6.7 x 4.1 cm at axial image # 44/2. Multiple additional pathologically enlarged mesenteric lymph nodes are identified demonstrating irregular margins. There is, additionally, pathologic bilateral common iliac and external iliac lymphadenopathy as well as right pelvic sidewall lymphadenopathy with the index right common iliac lymph node measuring 1.8 x 3.7 cm at axial image # 72/2. Borderline pathologic aortocaval and left periaortic lymphadenopathy is present. There is moderate aortoiliac atherosclerotic calcification. No aortic aneurysm. Reproductive: Mild prostatic enlargement. Seminal vesicles are unremarkable. Other: Soft tissue nodule within the right retroperitoneum adjacent to the right psoas muscle may represent pathologic retroperitoneal adenopathy or a metastatic implant. This measures 1.7 x 3.0 cm at axial image # 63/2. 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 Degenerative changes are seen within the lumbar spine. No lytic or blastic bone lesions are identified. IMPRESSION: Pathologic mesenteric,  retroperitoneal, and pelvic adenopathy, as described above. Moderate splenomegaly. Lobulated retroperitoneal soft tissue masses in the region of the right ureteropelvic junction likely resulting  in mild right hydronephrosis secondary to extrinsic mass effect as well as within the right retroperitoneum. 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 Less likely, this may represent metastatic disease secondary to a primary urothelial malignancy or potentially metastatic disease related to underlying prostate cancer, however, the nodal distribution would be unusual for the former and the lack of osseous metastatic disease given the extensive nodal involvement would be unusual for the latter. PET CT examination may be more helpful for further evaluation as well as identification of a a optimal tissue sampling target. Pathologic right external iliac lymphadenopathy may be amenable to ultrasound-guided sampling for definitive diagnosis. 6 mm of right middle lobe pulmonary nodule. Non-contrast chest CT at 6-12 months is recommended. If the nodule is stable at time of repeat CT, then future CT at 18-24 months (from today's scan) is considered optional for low-risk patients, but is recommended for high-risk patients. This recommendation follows the consensus statement: Guidelines for Management of Incidental Pulmonary Nodules Detected on CT Images: From the Fleischner Society 2017; Radiology 2017; 284:228-243. Extensive coronary artery calcification. Scattered ground-glass pulmonary infiltrates and subtle smooth interlobular septal thickening may reflect changes of mild cardiogenic failure. At least mild anemia. Aortic Atherosclerosis (ICD10-I70.0). Electronically Signed   By: Fidela Salisbury MD   On: 04/30/2021 14:38   DG C-Arm 1-60 Min-No Report  Result Date: 05/01/2021 Fluoroscopy was utilized by the requesting physician.  No radiographic interpretation.    Assessment/Plan: 74yo with right  hydronephrosis and ARF  Continue foley to straight drain and continue to trend creatinine   LOS: 2 days   Nicolette Bang 05/02/2021, 9:12 AM

## 2021-05-02 NOTE — Progress Notes (Signed)
Pharmacy: [facility]  Patient is a 75 y.o M with hx afib on Eliquis PTA who presented to the ED on 6/16 with c/o abdominal pain. Abdominal CT showed lymphadenopathy in the retroperitoneum and a mass which is near the UPJ area.  He underwent a cystoscopy procedure on 6/17 with right ureteral stent and foley placement. Plan is for biopsy of the right perirenal mass on 6/20.  Eliquis placed on hold in anticipation for procedure and he's currently being bridged with heparin drip.  - last dose of Eliquis was taken on 6/16 at 0800  - heparin level is elevated at 0.92 but this could be the residual effect of Eliquis - aPTT level is subtherapeutic at 36 sec - since the two levels are not correlating, will adjust heparin rate using aPTT at this time - Per pt's RN, no issue with IV line and no bleeding noted  - scr trending up with 2.58 (crcl~32)  Goal of Therapy:  Heparin level 0.3-0.7 units/ml aPTT 66-102 seconds Monitor platelets by anticoagulation protocol: Yes    Plan: - Increase heparin drip from 1300 units/hr to 1500 units/hr - check 8 hr heparin level and aPTT - monitor for s/sx bleeding  Dia Sitter, PharmD, BCPS 05/02/2021 9:37 PM

## 2021-05-02 NOTE — Progress Notes (Signed)
PROGRESS NOTE    [name]  EVO:[id number] DOB: [birth year] DOA: 04/30/2021 PCP: [name], MD   Chief Complain: Abdominal pain  Brief Narrative: Patient is a 75 year old male with history of diabetes type 2, hypertension, coronary disease status post PCI with a stent placement in January of this year, on Plavix, paroxysmal A. fib on Eliquis and amiodarone, diabetes type 2, CKD stage IIIa who presents to the emergency department with complaints of right-sided abdominal pain.  Symptoms started about 2 weeks ago.  On presentation his creatinine was found to be 2.4.  Hemoglobin was 9.7.  Abdominal CT showed multiple lymphadenopathy suspicious for lymphoma, right hydronephrosis due to possible right ureteral mass.  Urology consulted and he underwent cystoscopy with right-sided ureteral stent placement on 05/01/21.  IR consulted for biopsy of the lymph node to rule out lymphoma,plan for monday  Assessment & Plan:   Principal Problem:   Lymphadenopathy Active Problems:   Hydronephrosis of right kidney   AKI (acute kidney injury) ([place])   CAD (coronary artery disease)   DM2 (diabetes mellitus, type 2) (H[facility])   PAF (paroxysmal atrial fibrillation) ([facility])   HTN (hypertension)   Right-sided hydronephrosis: Underwent right-sided ureteral stent placement by urology.  Foley has been placed.Pinkish urine being drained through the Foley. Urology following.  AKI on CKD stage IIIa: Last creatinine was 1.4 as per Feb 22.  Presented with creatinine of 2.4.  Likely associated with obstructive pathology.  Kidney function little worse today most likely secondary to vigorous urine output causing volume depletion.  Continue Foley catheter for now.  Monitor input/output.  Continue aggressive fluid resuscitation. also on lisinopril at home which has been held.  Avoid nephrotoxins.  We will check urine creatinine and sodium.  We will involve nephrology if kidney function continues to get worse  Pelvic  masses/abdominal lymphadenopathy:Pathologic mesenteric, retroperitoneal, and pelvic adenopathy. We have consulted IR for possible CT-guided biopsy from the lymph node/ultrasound-guided biopsy.Plan for Monday  Chills/mild grade fever: New problem.  His fever was 99 when examined at the bedside.  We will get blood cultures.  We will started on ceftriaxone 2 g daily.  Worried about iatrogenic infection because he just had a urology procedure yesterday.  History of coronary artery disease: Takes Plavix at home.  Stent was recently placed in January.  We will continue to hold Plavix for possible upcoming biopsy procedure but we will put  him on heparin drip.  Paroxysmal A. fib: Currently rate is well controlled.  On Eliquis for anticoagulation at home.  Takes beta-blocker and amiodarone.  Eliquis/Plavix currently on hold for possible upcoming biopsy procedure.Will start heparin today.He is currently in normal sinus rhythm  Hypertension: Continue current medications.  Lisinopril on hold.  Monitor blood pressure  Diabetes type 2: On metformin, Lantus at home.  Currently on sliding scale insulin   Lung nodule: 6 mm of right middle lobe pulmonary nodule.  Needs follow-up as an outpatient  Morbid obesity: BMI of 35.8     DVT prophylaxis: SCD Code Status: Full code Family Communication:: Discussed with daughter on phone on 05/02/21 Status is: Inpatient  Remains inpatient appropriate because:Unsafe d/c plan  Dispo: The patient is from: Home              Anticipated d/c is to: Home              Patient currently is not medically stable to d/c.   Difficult to place patient No     Consultants: urology  Procedures:stent  placement  Antimicrobials:  Anti-infectives (From admission, onward)    Start     Dose/Rate Route Frequency Ordered Stop   05/02/21 1000  cefTRIAXone (ROCEPHIN) 2 g in sodium chloride 0.9 % 100 mL IVPB        2 g 200 mL/hr over 30 Minutes Intravenous Daily 05/02/21 0926      05/01/21 1128  ceFAZolin (ANCEF) 2-4 GM/100ML-% IVPB       Note to Pharmacy: Bridget Hartshorn   : cabinet override      05/01/21 1128 05/01/21 1248       Subjective:  Patient seen and examined the bedside this morning.  Comfortable.  Complains of some chills.  He had mild grade fever. Denies  Abdominal pain, chest pain or shortness of breath.  [place] interpreter was used through [employer] optician for conversation.   Objective: Vitals:   05/01/21 1249 05/01/21 1944 05/02/21 0317 05/02/21 0930  BP: (!) 160/91 131/67 (!) 154/79   Pulse: 61 66 64   Resp: 16 15 18    Temp: 97.8 F (36.6 C) 98 F (36.7 C) 97.8 F (36.6 C) 99.3 F (37.4 C)  TempSrc: Oral Oral Oral Oral  SpO2: 98% 98% 99%   Weight:      Height:        Intake/Output Summary (Last 24 hours) at 05/02/2021 1125 Last data filed at 05/02/2021 4888 Gross per 24 hour  Intake 1022 ml  Output 5302 ml  Net -4280 ml   Filed Weights   04/30/21 1240  Weight: 113.4 kg    Examination:  General exam: Appears calm and comfortable ,Not in distress,obese Respiratory system: Bilateral equal air entry, normal vesicular breath sounds, no wheezes or crackles  Cardiovascular system: S1 & S2 heard, RRR. No JVD, murmurs, rubs, gallops or clicks. No pedal edema. Gastrointestinal system: Abdomen is nondistended, soft and nontender. No organomegaly or masses felt. Normal bowel sounds heard. Central nervous system: Alert and oriented. No focal neurological deficits. Extremities: No edema, no clubbing ,no cyanosis Skin: No rashes, lesions or ulcers,no icterus ,no pallor GU : Foley with pinkish urine   Data Reviewed: I have personally reviewed following labs and imaging studies  CBC: Recent Labs  Lab 04/30/21 1252 05/01/21 0515 05/02/21 0629  WBC 2.6* 2.0* 4.9  NEUTROABS  --   --  2.7  HGB 9.7* 9.7* 11.1*  HCT 30.8* 30.4* 34.7*  MCV 80.8 81.3 81.3  PLT 162 164 916   Basic Metabolic Panel: Recent Labs  Lab 04/30/21 1252 05/01/21 0515  05/02/21 0629  NA 140 139 138  K 4.6 4.4 4.5  CL 108 109 106  CO2 23 21* 26  GLUCOSE 157* 123* 228*  BUN 33* 31* 37*  CREATININE 2.40* 2.45* 2.58*  CALCIUM 8.8* 8.7* 9.0   GFR: Estimated Creatinine Clearance: 32.2 mL/min (A) (by C-G formula based on SCr of 2.58 mg/dL (H)). Liver Function Tests: Recent Labs  Lab 04/30/21 1252  AST 10*  ALT 12  ALKPHOS 64  BILITOT 0.6  PROT 5.9*  ALBUMIN 3.7   Recent Labs  Lab 04/30/21 1252  LIPASE 13   No results for input(s): AMMONIA in the last 168 hours. Coagulation Profile: Recent Labs  Lab 05/02/21 0629  INR 1.5*   Cardiac Enzymes: No results for input(s): CKTOTAL, CKMB, CKMBINDEX, TROPONINI in the last 168 hours. BNP (last 3 results) No results for input(s): PROBNP in the last 8760 hours. HbA1C: Recent Labs    05/01/21 0515  HGBA1C 7.7*   CBG: Recent Labs  Lab 05/01/21 1944 05/01/21 2338 05/02/21 0318 05/02/21 0728 05/02/21 1115  GLUCAP 296* 267* 208* 200* 215*   Lipid Profile: No results for input(s): CHOL, HDL, LDLCALC, TRIG, CHOLHDL, LDLDIRECT in the last 72 hours. Thyroid Function Tests: No results for input(s): TSH, T4TOTAL, FREET4, T3FREE, THYROIDAB in the last 72 hours. Anemia Panel: No results for input(s): VITAMINB12, FOLATE, FERRITIN, TIBC, IRON, RETICCTPCT in the last 72 hours. Sepsis Labs: No results for input(s): PROCALCITON, LATICACIDVEN in the last 168 hours.  Recent Results (from the past 240 hour(s))  Resp Panel by RT-PCR (Flu A&B, Covid) Nasopharyngeal Swab     Status: None   Collection Time: 04/30/21  2:58 PM   Specimen: Nasopharyngeal Swab; Nasopharyngeal(NP) swabs in vial transport medium  Result Value Ref Range Status   SARS Coronavirus 2 by RT PCR NEGATIVE NEGATIVE Final    Comment: (NOTE) SARS-CoV-2 target nucleic acids are NOT DETECTED.  The SARS-CoV-2 RNA is generally detectable in upper respiratory specimens during the acute phase of infection. The lowest concentration of  SARS-CoV-2 viral copies this assay can detect is 138 copies/mL. A negative result does not preclude SARS-Cov-2 infection and should not be used as the sole basis for treatment or other patient management decisions. A negative result may occur with  improper specimen collection/handling, submission of specimen other than nasopharyngeal swab, presence of viral mutation(s) within the areas targeted by this assay, and inadequate number of viral copies(<138 copies/mL). A negative result must be combined with clinical observations, patient history, and epidemiological information. The expected result is Negative.  Fact Sheet for Patients:  EntrepreneurPulse.com.au  Fact Sheet for Healthcare Providers:  IncredibleEmployment.be  This test is no t yet approved or cleared by the [place] FDA and  has been authorized for detection and/or diagnosis of SARS-CoV-2 by FDA under an Emergency Use Authorization (EUA). This EUA will remain  in effect (meaning this test can be used) for the duration of the COVID-19 declaration under Section 564(b)(1) of the Act, 21 U.S.C.section 360bbb-3(b)(1), unless the authorization is terminated  or revoked sooner.       Influenza A by PCR NEGATIVE NEGATIVE Final   Influenza B by PCR NEGATIVE NEGATIVE Final    Comment: (NOTE) The Xpert Xpress SARS-CoV-2/FLU/RSV plus assay is intended as an aid in the diagnosis of influenza from Nasopharyngeal swab specimens and should not be used as a sole basis for treatment. Nasal washings and aspirates are unacceptable for Xpert Xpress SARS-CoV-2/FLU/RSV testing.  Fact Sheet for Patients: EntrepreneurPulse.com.au  Fact Sheet for Healthcare Providers: IncredibleEmployment.be  This test is not yet approved or cleared by the [place] FDA and has been authorized for detection and/or diagnosis of SARS-CoV-2 by FDA under an Emergency Use  Authorization (EUA). This EUA will remain in effect (meaning this test can be used) for the duration of the COVID-19 declaration under Section 564(b)(1) of the Act, 21 U.S.C. section 360bbb-3(b)(1), unless the authorization is terminated or revoked.  Performed at [facility], [address] [place], [place] [address]          Radiology Studies: CT Abdomen Pelvis Wo Contrast  Result Date: 04/30/2021 CLINICAL DATA:  Right lower quadrant abdominal pain EXAM: CT ABDOMEN AND PELVIS WITHOUT CONTRAST TECHNIQUE: Multidetector CT imaging of the abdomen and pelvis was performed following the standard protocol without IV contrast. COMPARISON:  None. FINDINGS: Lower chest: 6 mm mean diameter noncalcified pulmonary nodule is seen within the visualized right middle lobe, indeterminate. Scattered ground-glass pulmonary infiltrates in the noted at  the lung bases bilaterally with subtle, smooth interlobular septal thickening possibly representing changes of trace pulmonary edema, possibly cardiogenic in nature. Extensive multi-vessel coronary artery calcification is present with probable stenting of the distal right coronary artery. Global cardiac size within normal limits. No pericardial effusion. Hypoattenuation of the cardiac blood pool is in keeping with at least mild anemia. Hepatobiliary: No focal liver abnormality is seen. No gallstones, gallbladder wall thickening, or biliary dilatation. Pancreas: Unremarkable Spleen: There is moderate splenomegaly with the spleen measuring 17.2 cm in greatest dimension. No definite intrasplenic lesion identified on this noncontrast examination. Adrenals/Urinary Tract: The adrenal glands are unremarkable. The kidneys are normal in size and position. There is mild right hydronephrosis secondary to an obstructing lobulated mass in the region of the right ureteropelvic junction measuring 3.1 x 4.5 x 3.8 cm on axial image # 58 and coronal image # 67 while  difficult to accurately characterize in the absence of contrast administration, the mass appears external to the right ureter and demonstrates occlusion of the a proximal right ureter likely related to extrinsic mass effect, best appreciated on coronal imaging. Mild bilateral nonspecific perinephric stranding. No intrarenal or ureteral calculi are identified no hydronephrosis on the left. Appearing calcifications along the posterior wall of the bladder in the region of the ureteropelvic junction may represent posterior layering calculi within the bladder lumen or may be artifactual and related to streak artifact from right total hip arthroplasty. Stomach/Bowel: The stomach, small bowel, and large bowel are unremarkable. No evidence of obstruction or focal inflammation. The appendix is normal. No free intraperitoneal gas or fluid. Vascular/Lymphatic: There is extensive mesenteric infiltration. Additionally, there is pathologic mesenteric adenopathy with the dominant mass measuring 6.7 x 4.1 cm at axial image # 44/2. Multiple additional pathologically enlarged mesenteric lymph nodes are identified demonstrating irregular margins. There is, additionally, pathologic bilateral common iliac and external iliac lymphadenopathy as well as right pelvic sidewall lymphadenopathy with the index right common iliac lymph node measuring 1.8 x 3.7 cm at axial image # 72/2. Borderline pathologic aortocaval and left periaortic lymphadenopathy is present. There is moderate aortoiliac atherosclerotic calcification. No aortic aneurysm. Reproductive: Mild prostatic enlargement. Seminal vesicles are unremarkable. Other: Soft tissue nodule within the right retroperitoneum adjacent to the right psoas muscle may represent pathologic retroperitoneal adenopathy or a metastatic implant. This measures 1.7 x 3.0 cm at axial image # 63/2. 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 Degenerative changes are seen within the  lumbar spine. No lytic or blastic bone lesions are identified. IMPRESSION: Pathologic mesenteric, retroperitoneal, and pelvic adenopathy, as described above. Moderate splenomegaly. Lobulated retroperitoneal soft tissue masses in the region of the right ureteropelvic junction likely resulting in mild right hydronephrosis secondary to extrinsic mass effect as well as within the right retroperitoneum. 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 Less likely, this may represent metastatic disease secondary to a primary urothelial malignancy or potentially metastatic disease related to underlying prostate cancer, however, the nodal distribution would be unusual for the former and the lack of osseous metastatic disease given the extensive nodal involvement would be unusual for the latter. PET CT examination may be more helpful for further evaluation as well as identification of a a optimal tissue sampling target. Pathologic right external iliac lymphadenopathy may be amenable to ultrasound-guided sampling for definitive diagnosis. 6 mm of right middle lobe pulmonary nodule. Non-contrast chest CT at 6-12 months is recommended. If the nodule is stable at time of repeat CT, then future CT at  18-24 months (from today's scan) is considered optional for low-risk patients, but is recommended for high-risk patients. This recommendation follows the consensus statement: Guidelines for Management of Incidental Pulmonary Nodules Detected on CT Images: From the Fleischner Society 2017; Radiology 2017; 284:228-243. Extensive coronary artery calcification. Scattered ground-glass pulmonary infiltrates and subtle smooth interlobular septal thickening may reflect changes of mild cardiogenic failure. At least mild anemia. Aortic Atherosclerosis (ICD10-I70.0). Electronically Signed   By: Fidela Salisbury MD   On: 04/30/2021 14:38   DG C-Arm 1-60 Min-No Report  Result Date: 05/01/2021 Fluoroscopy was utilized by the  requesting physician.  No radiographic interpretation.        Scheduled Meds:  amiodarone  200 mg Oral Daily   atorvastatin  20 mg Oral QPM   insulin aspart  0-9 Units Subcutaneous Q4H   isosorbide mononitrate  30 mg Oral Daily   metoprolol tartrate  25 mg Oral TID   prednisoLONE acetate  1 drop Left Eye BID   terazosin  2 mg Oral QHS   Continuous Infusions:  sodium chloride 150 mL/hr at 05/02/21 1022   cefTRIAXone (ROCEPHIN)  IV 2 g (05/02/21 1028)     LOS: 2 days    Time spent: 35 mins.More than 50% of that time was spent in counseling and/or coordination of care.      Shelly Coss, MD [facility] P6/18/2022, 11:25 AM

## 2021-05-02 NOTE — Progress Notes (Signed)
ANTICOAGULATION CONSULT NOTE - Initial Consult  Pharmacy Consult for IV Heparin (apixaban on hold) Indication: atrial fibrillation  No Known Allergies  Patient Measurements: Height: 5\' 10"  (177.8 cm) Weight: 113.4 kg (250 lb) IBW/kg (Calculated) : 73 Heparin Dosing Weight: 98 kg  Vital Signs: Temp: 99.3 F (37.4 C) (06/18 0930) Temp Source: Oral (06/18 0930) BP: 154/79 (06/18 0317) Pulse Rate: 64 (06/18 0317)  Labs: Recent Labs    04/30/21 1252 05/01/21 0515 05/02/21 0629  HGB 9.7* 9.7* 11.1*  HCT 30.8* 30.4* 34.7*  PLT 162 164 189  LABPROT  --   --  18.2*  INR  --   --  1.5*  CREATININE 2.40* 2.45* 2.58*    Estimated Creatinine Clearance: 32.2 mL/min (A) (by C-G formula based on SCr of 2.58 mg/dL (H)).   Medical History: Past Medical History:  Diagnosis Date   Anginal pain ([place])    Coronary artery disease    Diabetes mellitus without complication ([place])    Dysrhythmia    Hypertension    Sleep apnea     Medications:  Medications Prior to Admission  Medication Sig Dispense Refill Last Dose   amiodarone (PACERONE) 200 MG tablet Take 200 mg by mouth daily.   04/30/2021   atorvastatin (LIPITOR) 20 MG tablet Take 20 mg by mouth every evening.   04/29/2021   clopidogrel (PLAVIX) 75 MG tablet Take 75 mg by mouth daily.   04/30/2021 at 8 am   ELIQUIS 5 MG TABS tablet Take 5 mg by mouth 2 (two) times daily.   04/30/2021 at 8 am   glipiZIDE (GLUCOTROL) 10 MG tablet Take 10 mg by mouth 2 (two) times daily.   04/30/2021   hydrochlorothiazide (HYDRODIURIL) 25 MG tablet Take 25 mg by mouth daily.   04/30/2021   isosorbide mononitrate (IMDUR) 30 MG 24 hr tablet Take 30 mg by mouth daily.   04/30/2021   LANTUS SOLOSTAR 100 UNIT/ML Solostar Pen Inject 34 Units into the skin every evening.   04/29/2021   lisinopril (ZESTRIL) 20 MG tablet Take 20 mg by mouth daily.   04/29/2021   metFORMIN (GLUCOPHAGE) 1000 MG tablet Take 1,000 mg by mouth in the morning and at bedtime.   04/30/2021    metoprolol tartrate (LOPRESSOR) 25 MG tablet Take 25 mg by mouth 3 (three) times daily. (Morning, noon, evening)   04/30/2021 at 8 am   nitroGLYCERIN (NITROSTAT) 0.3 MG SL tablet Place 0.3 mg under the tongue every 5 (five) minutes x 3 doses as needed for chest pain.   unk last dose   prednisoLONE acetate (PRED FORTE) 1 % ophthalmic suspension Place 1 drop into the left eye 2 (two) times daily.   04/30/2021   terazosin (HYTRIN) 2 MG capsule Take 2 mg by mouth at bedtime.   04/29/2021   Scheduled:   amiodarone  200 mg Oral Daily   atorvastatin  20 mg Oral QPM   insulin aspart  0-9 Units Subcutaneous Q4H   metoprolol tartrate  25 mg Oral TID   prednisoLONE acetate  1 drop Left Eye BID   terazosin  2 mg Oral QHS   Infusions:   sodium chloride 150 mL/hr at 05/02/21 1022   cefTRIAXone (ROCEPHIN)  IV 2 g (05/02/21 1028)   PRN: acetaminophen **OR** acetaminophen, morphine injection, ondansetron **OR** ondansetron (ZOFRAN) IV  Assessment: 75 yo male with plan for biopsy R retroperitoneal mass on 6/20. Patient on apixaban PTA for Afib and is currently on hold for procedure. To start IV  heparin now perioperatively. Baseline CBC this AM, SCr elevated at 2.58. Last reported dose of apixaban was 6/16 at 0800 per PTA med list. Will check aPTT and heparin level initially as heparin level may be falsely elevated if apixaban still being eliminated from body  Goal of Therapy:  Heparin level 0.3-0.7 units/ml aPTT 66-102 seconds Monitor platelets by anticoagulation protocol: Yes   Plan:  NO IV Heparin bolus then Start IV Heparin infusion rate of 1300 units/hr Check heparin level and aPTT 8 hours after start of IV heparin Daily CBC and heparin level  Kara Mead 05/02/2021,11:40 AM

## 2021-05-02 NOTE — Progress Notes (Signed)
This is the 2nd time this nurse has found that the IV pump infusing the IV fluids at 150cc has been turned off. Pt c/o too much swelling. I restarted the pump and locked the feature on the back of the pump. Attempted to explain to the pt how important IV fluids are.

## 2021-05-03 DIAGNOSIS — R591 Generalized enlarged lymph nodes: Secondary | ICD-10-CM | POA: Diagnosis not present

## 2021-05-03 LAB — CBC
HCT: 30.8 % — ABNORMAL LOW (ref 39.0–52.0)
Hemoglobin: 9.7 g/dL — ABNORMAL LOW (ref 13.0–17.0)
MCH: 25.9 pg — ABNORMAL LOW (ref 26.0–34.0)
MCHC: 31.5 g/dL (ref 30.0–36.0)
MCV: 82.4 fL (ref 80.0–100.0)
Platelets: 165 10*3/uL (ref 150–400)
RBC: 3.74 MIL/uL — ABNORMAL LOW (ref 4.22–5.81)
RDW: 15.4 % (ref 11.5–15.5)
WBC: 3.2 10*3/uL — ABNORMAL LOW (ref 4.0–10.5)
nRBC: 0 % (ref 0.0–0.2)

## 2021-05-03 LAB — GLUCOSE, CAPILLARY
Glucose-Capillary: 237 mg/dL — ABNORMAL HIGH (ref 70–99)
Glucose-Capillary: 236 mg/dL — ABNORMAL HIGH (ref 70–99)
Glucose-Capillary: 294 mg/dL — ABNORMAL HIGH (ref 70–99)
Glucose-Capillary: 252 mg/dL — ABNORMAL HIGH (ref 70–99)
Glucose-Capillary: 206 mg/dL — ABNORMAL HIGH (ref 70–99)

## 2021-05-03 LAB — HEPARIN LEVEL (UNFRACTIONATED)
Heparin Unfractionated: 0.72 IU/mL — ABNORMAL HIGH (ref 0.30–0.70)
Heparin Unfractionated: 0.41 IU/mL (ref 0.30–0.70)

## 2021-05-03 LAB — BASIC METABOLIC PANEL
Anion gap: 5 (ref 5–15)
BUN: 38 mg/dL — ABNORMAL HIGH (ref 8–23)
CO2: 26 mmol/L (ref 22–32)
Calcium: 8.2 mg/dL — ABNORMAL LOW (ref 8.9–10.3)
Chloride: 108 mmol/L (ref 98–111)
Creatinine, Ser: 2.22 mg/dL — ABNORMAL HIGH (ref 0.61–1.24)
GFR, Estimated: 31 mL/min — ABNORMAL LOW (ref >60)
Glucose, Bld: 200 mg/dL — ABNORMAL HIGH (ref 70–99)
Potassium: 3.8 mmol/L (ref 3.5–5.1)
Sodium: 139 mmol/L (ref 135–145)

## 2021-05-03 LAB — APTT
aPTT: 38 seconds — ABNORMAL HIGH (ref 24–36)
aPTT: 38 seconds — ABNORMAL HIGH (ref 24–36)

## 2021-05-03 NOTE — Progress Notes (Signed)
PROGRESS NOTE    [name]  VEL:[id number] DOB: [birth year] DOA: 04/30/2021 PCP: [name], MD   Chief Complain: Abdominal pain  Brief Narrative: Patient is a 75 year old male with history of diabetes type 2, hypertension, coronary disease status post PCI with a stent placement in January of this year, on Plavix, paroxysmal A. fib on Eliquis and amiodarone, diabetes type 2, CKD stage IIIa who presents to the emergency department with complaints of right-sided abdominal pain.  Symptoms started about 2 weeks ago.  On presentation his creatinine was found to be 2.4.  Hemoglobin was 9.7.  Abdominal CT showed multiple lymphadenopathy suspicious for lymphoma, right hydronephrosis due to possible right ureteral mass.  Urology consulted and he underwent cystoscopy with right-sided ureteral stent placement on 05/01/21.  IR consulted for biopsy of the lymph node to rule out lymphoma,plan for monday  Assessment & Plan:   Principal Problem:   Lymphadenopathy Active Problems:   Hydronephrosis of right kidney   AKI (acute kidney injury) ([place])   CAD (coronary artery disease)   DM2 (diabetes mellitus, type 2) (HCC)   PAF (paroxysmal atrial fibrillation) (H[facility])   HTN (hypertension)   Right-sided hydronephrosis: Underwent right-sided ureteral stent placement by urology.  Foley has been placed.Pinkish urine being drained through the Foley. Urology following.  AKI on CKD stage IIIa: Last creatinine was 1.4 as per Feb 22.  Presented with creatinine of 2.4.  Likely associated with obstructive pathology.  Kidney function little worse today most likely secondary to vigorous urine output causing volume depletion.  Continue Foley catheter for now.  Monitor input/output.  Continue aggressive fluid resuscitation. also on lisinopril at home which has been held.  Avoid nephrotoxins. .  We will involve nephrology if kidney function continues to get worse  Pelvic masses/abdominal  lymphadenopathy:Pathologic mesenteric, retroperitoneal, and pelvic adenopathy. We have consulted IR for CT-guided biopsy from the lymph node/ultrasound-guided biopsy.Plan for Monday  Chills/mild grade fever: He complained of chills on 05/02/2021.  His fever was 99 when examined at the bedside.  We have gotten blood cultures.  Started on ceftriaxone 2 g daily.  Worried about iatrogenic infection because he just had a urology procedure on 05/01/2021.  He does not have leukocytosis, he is afebrile today.  We will consider discontinuing antibiotics soon.  History of coronary artery disease: Takes Plavix at home.  Stent was recently placed in January.  We will continue to hold Plavix for possible upcoming biopsy procedure but we will put  him on heparin drip.  Paroxysmal A. fib: Currently rate is well controlled.  On Eliquis for anticoagulation at home.  Takes beta-blocker and amiodarone.  Eliquis/Plavix currently on hold for possible upcoming biopsy procedure.started heparin today.He is currently in normal sinus rhythm  Hypertension: Continue current medications.  Lisinopril on hold.  Monitor blood pressure  Diabetes type 2: On metformin, Lantus at home.  Currently on sliding scale insulin   Lung nodule: 6 mm of right middle lobe pulmonary nodule.  Needs follow-up as an outpatient  Morbid obesity: BMI of 35.8     DVT prophylaxis: SCD Code Status: Full code Family Communication:: Discussed with daughter on phone on 05/03/21 Status is: Inpatient  Remains inpatient appropriate because:Unsafe d/c plan  Dispo: The patient is from: Home              Anticipated d/c is to: Home              Patient currently is not medically stable to d/c.   Difficult to  place patient No     Consultants: urology  Procedures:stent placement  Antimicrobials:  Anti-infectives (From admission, onward)    Start     Dose/Rate Route Frequency Ordered Stop   05/02/21 1000  cefTRIAXone (ROCEPHIN) 2 g in sodium  chloride 0.9 % 100 mL IVPB        2 g 200 mL/hr over 30 Minutes Intravenous Daily 05/02/21 0926     05/01/21 1128  ceFAZolin (ANCEF) 2-4 GM/100ML-% IVPB       Note to Pharmacy: Bridget Hartshorn   : cabinet override      05/01/21 1128 05/01/21 1248       Subjective:  Patient seen and examined the bedside this morning.  Hemodynamically stable.  Comfortable.  Denies any new complaints   Objective: Vitals:   05/02/21 0930 05/02/21 1418 05/02/21 2048 05/03/21 0514  BP:  131/79 138/72 (!) 142/75  Pulse:  87 65 66  Resp:  16 16 16   Temp: 99.3 F (37.4 C) 99.3 F (37.4 C) 97.9 F (36.6 C) 98.6 F (37 C)  TempSrc: Oral  Oral Oral  SpO2:   98% 99%  Weight:      Height:        Intake/Output Summary (Last 24 hours) at 05/03/2021 0804 Last data filed at 05/03/2021 0734 Gross per 24 hour  Intake 2294 ml  Output 6600 ml  Net -4306 ml   Filed Weights   04/30/21 1240  Weight: 113.4 kg    Examination:  General exam: Overall comfortable, not in distress HEENT: PERRL Respiratory system:  no wheezes or crackles  Cardiovascular system: S1 & S2 heard, RRR.  Gastrointestinal system: Abdomen is nondistended, soft and nontender. Central nervous system: Alert and oriented Extremities: No edema, no clubbing ,no cyanosis Skin: No rashes, no ulcers,no icterus   GU: Foley   Data Reviewed: I have personally reviewed following labs and imaging studies  CBC: Recent Labs  Lab 04/30/21 1252 05/01/21 0515 05/02/21 0629 05/03/21 0638  WBC 2.6* 2.0* 4.9 3.2*  NEUTROABS  --   --  2.7  --   HGB 9.7* 9.7* 11.1* 9.7*  HCT 30.8* 30.4* 34.7* 30.8*  MCV 80.8 81.3 81.3 82.4  PLT 162 164 189 166   Basic Metabolic Panel: Recent Labs  Lab 04/30/21 1252 05/01/21 0515 05/02/21 0629  NA 140 139 138  K 4.6 4.4 4.5  CL 108 109 106  CO2 23 21* 26  GLUCOSE 157* 123* 228*  BUN 33* 31* 37*  CREATININE 2.40* 2.45* 2.58*  CALCIUM 8.8* 8.7* 9.0   GFR: Estimated Creatinine Clearance: 32.2  mL/min (A) (by C-G formula based on SCr of 2.58 mg/dL (H)). Liver Function Tests: Recent Labs  Lab 04/30/21 1252  AST 10*  ALT 12  ALKPHOS 64  BILITOT 0.6  PROT 5.9*  ALBUMIN 3.7   Recent Labs  Lab 04/30/21 1252  LIPASE 13   No results for input(s): AMMONIA in the last 168 hours. Coagulation Profile: Recent Labs  Lab 05/02/21 0629  INR 1.5*   Cardiac Enzymes: No results for input(s): CKTOTAL, CKMB, CKMBINDEX, TROPONINI in the last 168 hours. BNP (last 3 results) No results for input(s): PROBNP in the last 8760 hours. HbA1C: Recent Labs    05/01/21 0515  HGBA1C 7.7*   CBG: Recent Labs  Lab 05/02/21 0728 05/02/21 1115 05/02/21 1705 05/02/21 2010 05/03/21 0604  GLUCAP 200* 215* 216* 198* 237*   Lipid Profile: No results for input(s): CHOL, HDL, LDLCALC, TRIG, CHOLHDL, LDLDIRECT in the last  72 hours. Thyroid Function Tests: No results for input(s): TSH, T4TOTAL, FREET4, T3FREE, THYROIDAB in the last 72 hours. Anemia Panel: No results for input(s): VITAMINB12, FOLATE, FERRITIN, TIBC, IRON, RETICCTPCT in the last 72 hours. Sepsis Labs: No results for input(s): PROCALCITON, LATICACIDVEN in the last 168 hours.  Recent Results (from the past 240 hour(s))  Resp Panel by RT-PCR (Flu A&B, Covid) Nasopharyngeal Swab     Status: None   Collection Time: 04/30/21  2:58 PM   Specimen: Nasopharyngeal Swab; Nasopharyngeal(NP) swabs in vial transport medium  Result Value Ref Range Status   SARS Coronavirus 2 by RT PCR NEGATIVE NEGATIVE Final    Comment: (NOTE) SARS-CoV-2 target nucleic acids are NOT DETECTED.  The SARS-CoV-2 RNA is generally detectable in upper respiratory specimens during the acute phase of infection. The lowest concentration of SARS-CoV-2 viral copies this assay can detect is 138 copies/mL. A negative result does not preclude SARS-Cov-2 infection and should not be used as the sole basis for treatment or other patient management decisions. A negative  result may occur with  improper specimen collection/handling, submission of specimen other than nasopharyngeal swab, presence of viral mutation(s) within the areas targeted by this assay, and inadequate number of viral copies(<138 copies/mL). A negative result must be combined with clinical observations, patient history, and epidemiological information. The expected result is Negative.  Fact Sheet for Patients:  EntrepreneurPulse.com.au  Fact Sheet for Healthcare Providers:  IncredibleEmployment.be  This test is no t yet approved or cleared by the [place] FDA and  has been authorized for detection and/or diagnosis of SARS-CoV-2 by FDA under an Emergency Use Authorization (EUA). This EUA will remain  in effect (meaning this test can be used) for the duration of the COVID-19 declaration under Section 564(b)(1) of the Act, 21 U.S.C.section 360bbb-3(b)(1), unless the authorization is terminated  or revoked sooner.       Influenza A by PCR NEGATIVE NEGATIVE Final   Influenza B by PCR NEGATIVE NEGATIVE Final    Comment: (NOTE) The Xpert Xpress SARS-CoV-2/FLU/RSV plus assay is intended as an aid in the diagnosis of influenza from Nasopharyngeal swab specimens and should not be used as a sole basis for treatment. Nasal washings and aspirates are unacceptable for Xpert Xpress SARS-CoV-2/FLU/RSV testing.  Fact Sheet for Patients: EntrepreneurPulse.com.au  Fact Sheet for Healthcare Providers: IncredibleEmployment.be  This test is not yet approved or cleared by the [place] FDA and has been authorized for detection and/or diagnosis of SARS-CoV-2 by FDA under an Emergency Use Authorization (EUA). This EUA will remain in effect (meaning this test can be used) for the duration of the COVID-19 declaration under Section 564(b)(1) of the Act, 21 U.S.C. section 360bbb-3(b)(1), unless the authorization is  terminated or revoked.  Performed at [facility], [address], [place], [place] [address]          Radiology Studies: DG C-Arm 1-60 Min-No Report  Result Date: 05/01/2021 Fluoroscopy was utilized by the requesting physician.  No radiographic interpretation.        Scheduled Meds:  amiodarone  200 mg Oral Daily   atorvastatin  20 mg Oral QPM   Chlorhexidine Gluconate Cloth  6 each Topical Daily   insulin aspart  0-9 Units Subcutaneous Q4H   metoprolol tartrate  25 mg Oral TID   prednisoLONE acetate  1 drop Left Eye BID   terazosin  2 mg Oral QHS   Continuous Infusions:  sodium chloride 150 mL/hr at 05/03/21 0536   cefTRIAXone (ROCEPHIN)  IV 2 g (05/02/21 1028)   heparin 1,500 Units/hr (05/03/21 0606)     LOS: 3 days    Time spent: 25 mins.More than 50% of that time was spent in counseling and/or coordination of care.      Shelly Coss, MD [facility] P6/19/2022, 8:04 AM

## 2021-05-03 NOTE — Progress Notes (Signed)
Pt was found in the bathroom, Pt has been reminded several times to call for help to get in the BR for assistance and he returns to bed very SOB, foley bag now had pink tinged urine. Pharmacy has been informed.

## 2021-05-03 NOTE — Progress Notes (Signed)
Pharmacy: [facility]  Patient is a 75 y.o M with hx afib on Eliquis PTA who presented to the ED on 6/16 with c/o abdominal pain. Abdominal CT showed lymphadenopathy in the retroperitoneum and a mass which is near the UPJ area.  He underwent a cystoscopy procedure on 6/17 with right ureteral stent and foley placement. Plan is for biopsy of the right perirenal mass on 6/20.  Eliquis placed on hold in anticipation for procedure and he's currently being bridged with heparin drip.   - heparin level is 0.42 - aPTT level is subtherapeutic at 38 sec - since the two levels are not correlating, will adjust heparin rate using aPTT at this time - Per pt's RN, no issue with IV line and pt still has pink tinged urine (same color as this morning) - scr 2.22 (crcl~37)  Goal of Therapy:  Heparin level 0.3-0.7 units/ml aPTT 66-102 seconds Monitor platelets by anticoagulation protocol: Yes   Plan: - Increase heparin drip 1800 units/hr - check 8 hr heparin level and aPTT - monitor for severity of blood noted in urine - Please advise if/when heparin drip needs to be held biopsy procedure on 6/20  Dia Sitter, PharmD, BCPS 05/03/2021 7:38 PM

## 2021-05-03 NOTE — Progress Notes (Signed)
ANTICOAGULATION CONSULT NOTE - Initial Consult  Pharmacy Consult for IV Heparin (apixaban on hold) Indication: atrial fibrillation, plan biopsy 6/20  No Known Allergies  Patient Measurements: Height: 5\' 10"  (177.8 cm) Weight: 113.4 kg (250 lb) IBW/kg (Calculated) : 73 Heparin Dosing Weight: 98 kg  Vital Signs: Temp: 98.6 F (37 C) (06/19 0514) Temp Source: Oral (06/19 0514) BP: 142/75 (06/19 0514) Pulse Rate: 66 (06/19 0514)  Labs: Recent Labs    04/30/21 1252 05/01/21 0515 05/02/21 0629 05/02/21 2044 05/03/21 0638  HGB 9.7* 9.7* 11.1*  --  9.7*  HCT 30.8* 30.4* 34.7*  --  30.8*  PLT 162 164 189  --  165  APTT  --   --   --  36 38*  LABPROT  --   --  18.2*  --   --   INR  --   --  1.5*  --   --   HEPARINUNFRC  --   --   --  0.92* 0.72*  CREATININE 2.40* 2.45* 2.58*  --   --    Estimated Creatinine Clearance: 32.2 mL/min (A) (by C-G formula based on SCr of 2.58 mg/dL (H)).  Medical History: Past Medical History:  Diagnosis Date   Anginal pain ([place])    Coronary artery disease    Diabetes mellitus without complication (HCC)    Dysrhythmia    Hypertension    Sleep apnea    Scheduled:   amiodarone  200 mg Oral Daily   atorvastatin  20 mg Oral QPM   Chlorhexidine Gluconate Cloth  6 each Topical Daily   insulin aspart  0-9 Units Subcutaneous Q4H   metoprolol tartrate  25 mg Oral TID   prednisoLONE acetate  1 drop Left Eye BID   terazosin  2 mg Oral QHS   Infusions:   sodium chloride 150 mL/hr at 05/03/21 0536   cefTRIAXone (ROCEPHIN)  IV 2 g (05/02/21 1028)   heparin 1,500 Units/hr (05/03/21 0606)   Assessment: 75 yo male with plan for biopsy R retroperitoneal mass on 6/20. Patient on apixaban PTA for Afib and is currently on hold for procedure. To start IV heparin now perioperatively. Baseline CBC this AM, SCr elevated at 2.58. Last reported dose of apixaban was 6/16 at 0800 per PTA med list. Will check aPTT and heparin level initially as heparin level may be  falsely elevated if apixaban still being eliminated from body  6/18: start Heparin at 1300 units/hr, no bolus.  First Hep level 0.92, aPTT 36 sec; values did not correlate, monitor Heparin with aPTT, Heparin incr to 1500 units/hr  Goal of Therapy:  Heparin level 0.3-0.7 units/ml aPTT 66-102 seconds Monitor platelets by anticoagulation protocol: Yes  Today, 05/03/2021 Blood-tinged urine noted 0600 per RN - on inspection, urine barely pink Hep level decr at 0.72 units/ml, aPTT unchanged 38 sec - levels not correlating Hesitate to increase Heparin by large amount, RN noted that patient was turning off his IV pumps   Plan:  Increase Heparin to 1600 units/hr Recheck Heparin level 1700 Plan for biopsy tomorrow 6/20  Minda Ditto PharmD 05/03/2021,7:26 AM

## 2021-05-04 ENCOUNTER — Inpatient Hospital Stay (HOSPITAL_COMMUNITY): Payer: Medicare HMO

## 2021-05-04 DIAGNOSIS — R591 Generalized enlarged lymph nodes: Secondary | ICD-10-CM | POA: Diagnosis not present

## 2021-05-04 LAB — CBC
HCT: 31.5 % — ABNORMAL LOW (ref 39.0–52.0)
Hemoglobin: 10 g/dL — ABNORMAL LOW (ref 13.0–17.0)
MCH: 26 pg (ref 26.0–34.0)
MCHC: 31.7 g/dL (ref 30.0–36.0)
MCV: 82 fL (ref 80.0–100.0)
Platelets: 181 10*3/uL (ref 150–400)
RBC: 3.84 MIL/uL — ABNORMAL LOW (ref 4.22–5.81)
RDW: 15.2 % (ref 11.5–15.5)
WBC: 3 10*3/uL — ABNORMAL LOW (ref 4.0–10.5)
nRBC: 0 % (ref 0.0–0.2)

## 2021-05-04 LAB — BASIC METABOLIC PANEL
Anion gap: 7 (ref 5–15)
BUN: 27 mg/dL — ABNORMAL HIGH (ref 8–23)
CO2: 24 mmol/L (ref 22–32)
Calcium: 8.2 mg/dL — ABNORMAL LOW (ref 8.9–10.3)
Chloride: 111 mmol/L (ref 98–111)
Creatinine, Ser: 1.91 mg/dL — ABNORMAL HIGH (ref 0.61–1.24)
GFR, Estimated: 37 mL/min — ABNORMAL LOW (ref >60)
Glucose, Bld: 192 mg/dL — ABNORMAL HIGH (ref 70–99)
Potassium: 3.9 mmol/L (ref 3.5–5.1)
Sodium: 142 mmol/L (ref 135–145)

## 2021-05-04 LAB — GLUCOSE, CAPILLARY
Glucose-Capillary: 174 mg/dL — ABNORMAL HIGH (ref 70–99)
Glucose-Capillary: 174 mg/dL — ABNORMAL HIGH (ref 70–99)
Glucose-Capillary: 177 mg/dL — ABNORMAL HIGH (ref 70–99)
Glucose-Capillary: 178 mg/dL — ABNORMAL HIGH (ref 70–99)
Glucose-Capillary: 183 mg/dL — ABNORMAL HIGH (ref 70–99)
Glucose-Capillary: 245 mg/dL — ABNORMAL HIGH (ref 70–99)
Glucose-Capillary: 345 mg/dL — ABNORMAL HIGH (ref 70–99)

## 2021-05-04 LAB — APTT: aPTT: 30 seconds (ref 24–36)

## 2021-05-04 LAB — HEPARIN LEVEL (UNFRACTIONATED): Heparin Unfractionated: 0.35 IU/mL (ref 0.30–0.70)

## 2021-05-04 MED ORDER — AMLODIPINE BESYLATE 10 MG PO TABS
10.0000 mg | ORAL_TABLET | Freq: Every day | ORAL | Status: DC
  Administered 2021-05-04 – 2021-05-05 (×2): 10 mg via ORAL
  Filled 2021-05-04 (×2): qty 1

## 2021-05-04 MED ORDER — INSULIN GLARGINE 100 UNIT/ML ~~LOC~~ SOLN
12.0000 [IU] | Freq: Every day | SUBCUTANEOUS | Status: DC
  Administered 2021-05-04 – 2021-05-05 (×2): 12 [IU] via SUBCUTANEOUS
  Filled 2021-05-04 (×2): qty 0.12

## 2021-05-04 MED ORDER — MIDAZOLAM HCL 2 MG/2ML IJ SOLN
INTRAMUSCULAR | Status: AC
  Filled 2021-05-04: qty 4

## 2021-05-04 MED ORDER — LIDOCAINE HCL (PF) 1 % IJ SOLN
INTRAMUSCULAR | Status: AC | PRN
  Administered 2021-05-04: 20 mL

## 2021-05-04 MED ORDER — METOPROLOL TARTRATE 50 MG PO TABS
50.0000 mg | ORAL_TABLET | Freq: Two times a day (BID) | ORAL | Status: DC
  Administered 2021-05-04 – 2021-05-05 (×3): 50 mg via ORAL
  Filled 2021-05-04 (×4): qty 1

## 2021-05-04 MED ORDER — MIDAZOLAM HCL 2 MG/2ML IJ SOLN
INTRAMUSCULAR | Status: AC | PRN
  Administered 2021-05-04 (×2): 1 mg via INTRAVENOUS

## 2021-05-04 MED ORDER — FENTANYL CITRATE (PF) 100 MCG/2ML IJ SOLN
INTRAMUSCULAR | Status: AC
  Filled 2021-05-04: qty 2

## 2021-05-04 MED ORDER — FENTANYL CITRATE (PF) 100 MCG/2ML IJ SOLN
INTRAMUSCULAR | Status: AC | PRN
  Administered 2021-05-04 (×2): 50 ug via INTRAVENOUS

## 2021-05-04 NOTE — Progress Notes (Signed)
Inpatient Diabetes Program Recommendations  AACE/ADA: New Consensus Statement on Inpatient Glycemic Control (2015)  Target Ranges:  Prepandial:   less than 140 mg/dL      Peak postprandial:   less than 180 mg/dL (1-2 hours)      Critically ill patients:  140 - 180 mg/dL   Results for [name], [name] (MRN [id number]) as of 05/04/2021 09:50  Ref. Range 05/03/2021 06:04 05/03/2021 10:12 05/03/2021 12:52 05/03/2021 16:49 05/03/2021 20:09  Glucose-Capillary Latest Ref Range: 70 - 99 mg/dL 237 (H) 236 (H) 294 (H) 252 (H) 206 (H)     Home DM Meds: Lantus 34 units QHS      Glipizide 10 mg BID      Metformin 1000 mg BID    Current Orders: Novolog 0-9 units Q4 hours    NPO this AM for CT-guided biopsy lymph node   MD- Please consider adding back portion of pt's home dose Lantus  Lantus 12 units QHS (1/3 total home dose)    --Will follow patient during hospitalization--  Wyn Quaker RN, MSN, CDE Diabetes Coordinator Inpatient Glycemic Control Team Team Pager: [phone] (8a-5p)

## 2021-05-04 NOTE — Procedures (Signed)
Interventional Radiology Procedure Note  Procedure: CT guided right retroperitoneal mass biopsy  Findings: Please refer to procedural dictation for full description. Right infrarenal mass biopsy with 18 ga core x 3.  Gelfoam slurry needle track embolization.  Complications: None immediate  Estimated Blood Loss: < 5 mL  Recommendations: 3 hours bedrest. Follow Pathology results.   Ruthann Cancer, MD

## 2021-05-04 NOTE — Progress Notes (Signed)
ANTICOAGULATION CONSULT NOTE - follow up  Pharmacy Consult for IV Heparin (apixaban on hold) Indication: atrial fibrillation, plan biopsy 6/20  No Known Allergies  Patient Measurements: Height: 5\' 10"  (177.8 cm) Weight: 113.4 kg (250 lb) IBW/kg (Calculated) : 73 Heparin Dosing Weight: 98 kg  Vital Signs: Temp: 97.8 F (36.6 C) (06/20 0417) Temp Source: Oral (06/20 0417) BP: 185/81 (06/20 0417) Pulse Rate: 62 (06/20 0417)  Labs: Recent Labs    05/02/21 0629 05/02/21 2044 05/03/21 5597 05/03/21 0906 05/03/21 1816 05/04/21 0424  HGB 11.1*  --  9.7*  --   --  10.0*  HCT 34.7*  --  30.8*  --   --  31.5*  PLT 189  --  165  --   --  181  APTT  --    < > 38*  --  38* 30  LABPROT 18.2*  --   --   --   --   --   INR 1.5*  --   --   --   --   --   HEPARINUNFRC  --    < > 0.72*  --  0.41 0.35  CREATININE 2.58*  --   --  2.22*  --  1.91*   < > = values in this interval not displayed.   Estimated Creatinine Clearance: 43.5 mL/min (A) (by C-G formula based on SCr of 1.91 mg/dL (H)).  Medical History: Past Medical History:  Diagnosis Date   Anginal pain ([place])    Coronary artery disease    Diabetes mellitus without complication (HCC)    Dysrhythmia    Hypertension    Sleep apnea    Scheduled:   amiodarone  200 mg Oral Daily   atorvastatin  20 mg Oral QPM   Chlorhexidine Gluconate Cloth  6 each Topical Daily   insulin aspart  0-9 Units Subcutaneous Q4H   metoprolol tartrate  25 mg Oral TID   prednisoLONE acetate  1 drop Left Eye BID   terazosin  2 mg Oral QHS   Infusions:   sodium chloride 150 mL/hr at 05/03/21 0536   cefTRIAXone (ROCEPHIN)  IV 2 g (05/03/21 1038)   heparin 1,800 Units/hr (05/03/21 2315)   Assessment: 75 yo male with plan for biopsy R retroperitoneal mass on 6/20. Patient on apixaban PTA for Afib and is currently on hold for procedure. To start IV heparin now perioperatively. Baseline CBC this AM, SCr elevated at 2.58. Last reported dose of apixaban  was 6/16 at 0800 per PTA med list. Will check aPTT and heparin level initially as heparin level may be falsely elevated if apixaban still being eliminated from body  6/18: start Heparin at 1300 units/hr, no bolus.  First Hep level 0.92, aPTT 36 sec; values did not correlate, monitor Heparin with aPTT, Heparin incr to 1500 units/hr  Goal of Therapy:  Heparin level 0.3-0.7 units/ml aPTT 66-102 seconds Monitor platelets by anticoagulation protocol: Yes  Today, 05/04/2021 HL 0.35 therapeutic on 1800 units/hr Hgb 10, Plts 181 Per pt's RN, no issue with IV line and pt still has pink tinged urine   Plan:  Continue heparin drip at 1800 units/hr Daily heparin level and CBC - Please advise if/when heparin drip needs to be held biopsy procedure on 6/20  Dolly Rias RPh 05/04/2021, 4:54 AM

## 2021-05-04 NOTE — Progress Notes (Addendum)
PROGRESS NOTE    [name]  ZYS:[id number] DOB: 1946-07-22 DOA: 04/30/2021 PCP: [name], MD   Chief Complain: Abdominal pain  Brief Narrative: Patient is a 75 year old male with history of diabetes type 2, hypertension, coronary disease status post PCI with a stent placement in January of this year, on Plavix, paroxysmal A. fib on Eliquis and amiodarone, diabetes type 2, CKD stage IIIa who presents to the emergency department with complaints of right-sided abdominal pain.  Symptoms started about 2 weeks ago.  On presentation his creatinine was found to be 2.4.  Hemoglobin was 9.7.  Abdominal CT showed multiple lymphadenopathy suspicious for lymphoma, right hydronephrosis due to possible right ureteral mass.  Urology consulted and he underwent cystoscopy with right-sided ureteral stent placement on 05/01/21.  IR consulted for biopsy of the lymph node to rule out lymphoma,plan for today.  Assessment & Plan:   Principal Problem:   Lymphadenopathy Active Problems:   Hydronephrosis of right kidney   AKI (acute kidney injury) ([place])   CAD (coronary artery disease)   DM2 (diabetes mellitus, type 2) ([facility])   PAF (paroxysmal atrial fibrillation) ([facility])   HTN (hypertension)   Right-sided hydronephrosis: Underwent right-sided ureteral stent placement by urology.  Foley has been placed.Clear urine being drained through the Foley. Urology following.  AKI on CKD stage IIIa: Last creatinine was 1.4 as per Feb 22.  Presented with creatinine of 2.4.  Likely associated with obstructive pathology.  Kidney function didn't improve quickly most likely  secondary to vigorous urine output causing volume depletion.  Continue Foley catheter for now.  Monitor input/output.  Continue aggressive fluid resuscitation. also on lisinopril at home which has been held.  Avoid nephrotoxins.  Kidney function gradually improving with IV fluids.  Pelvic masses/abdominal lymphadenopathy:Pathologic mesenteric,  retroperitoneal, and pelvic adenopathy. We have consulted IR for CT-guided biopsy from the lymph node/ultrasound-guided biopsy.  Chills/mild grade fever: He complained of chills on 05/02/2021.  His fever was 99 when examined at the bedside.  We have gotten blood cultures,NGTD.  Started on ceftriaxone 2 g daily.  Worried about iatrogenic infection because he just had a urology procedure on 05/01/2021.  He does not have leukocytosis, he is afebrile today.  We will dc abx.  History of coronary artery disease: Takes Plavix at home.  Stent was recently placed in January.  We will continue to hold Plavix for possible upcoming biopsy procedure but we  put  him on heparin drip.  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  Continue to monitor.  Paroxysmal A. fib: Currently rate is well controlled.  On Eliquis for anticoagulation at home.  Takes beta-blocker and amiodarone.  Eliquis/Plavix currently on hold for possible upcoming biopsy procedure.started heparin today.He is currently in normal sinus rhythm  Hypertension: Continue current medications.  Lisinopril on hold.  Monitor blood pressure  Diabetes type 2: On metformin, Lantus at home.  Currently on sliding scale insulin,lantus   Lung nodule: 6 mm of right middle lobe pulmonary nodule.  Needs follow-up as an outpatient  Morbid obesity: BMI of 35.8     DVT prophylaxis: SCD Code Status: Full code Family Communication:: Discussed with daughter on phone on 05/04/21 Status is: Inpatient  Remains inpatient appropriate because:Unsafe d/c plan  Dispo: The patient is from: Home              Anticipated d/c is to: Home              Patient currently is not  medically stable to d/c.   Difficult to place patient No     Consultants: urology  Procedures:stent placement  Antimicrobials:  Anti-infectives (From admission, onward)    Start     Dose/Rate Route Frequency Ordered Stop   05/02/21 1000   cefTRIAXone (ROCEPHIN) 2 g in sodium chloride 0.9 % 100 mL IVPB        2 g 200 mL/hr over 30 Minutes Intravenous Daily 05/02/21 0926     05/01/21 1128  ceFAZolin (ANCEF) 2-4 GM/100ML-% IVPB       Note to Pharmacy: Bridget Hartshorn   : cabinet override      05/01/21 1128 05/01/21 1248       Subjective:  Patient seen and examined the bedside this morning.  Hemodynamically stable.  Denies any complaints today.  Waiting for biopsy  Objective: Vitals:   05/03/21 0514 05/03/21 1254 05/03/21 2007 05/04/21 0417  BP: (!) 142/75 (!) 161/75 (!) 180/82 (!) 185/81  Pulse: 66 67 63 62  Resp: 16 16 17 18   Temp: 98.6 F (37 C) 97.8 F (36.6 C) 98 F (36.7 C) 97.8 F (36.6 C)  TempSrc: Oral Oral Oral Oral  SpO2: 99% 100% 97% 100%  Weight:      Height:        Intake/Output Summary (Last 24 hours) at 05/04/2021 3532 Last data filed at 05/04/2021 0418 Gross per 24 hour  Intake 4298.79 ml  Output 4750 ml  Net -451.21 ml   Filed Weights   04/30/21 1240  Weight: 113.4 kg    Examination:  General exam: Overall comfortable, not in distress HEENT: PERRL Respiratory system:  no wheezes or crackles  Cardiovascular system: S1 & S2 heard, RRR.  Gastrointestinal system: Abdomen is nondistended, soft and nontender. Central nervous system: Alert and oriented Extremities: No edema, no clubbing ,no cyanosis Skin: No rashes, no ulcers,no icterus   GU: Foley   Data Reviewed: I have personally reviewed following labs and imaging studies  CBC: Recent Labs  Lab 04/30/21 1252 05/01/21 0515 05/02/21 0629 05/03/21 0638 05/04/21 0424  WBC 2.6* 2.0* 4.9 3.2* 3.0*  NEUTROABS  --   --  2.7  --   --   HGB 9.7* 9.7* 11.1* 9.7* 10.0*  HCT 30.8* 30.4* 34.7* 30.8* 31.5*  MCV 80.8 81.3 81.3 82.4 82.0  PLT 162 164 189 165 992   Basic Metabolic Panel: Recent Labs  Lab 04/30/21 1252 05/01/21 0515 05/02/21 0629 05/03/21 0906 05/04/21 0424  NA 140 139 138 139 142  K 4.6 4.4 4.5 3.8 3.9  CL 108  109 106 108 111  CO2 23 21* 26 26 24   GLUCOSE 157* 123* 228* 200* 192*  BUN 33* 31* 37* 38* 27*  CREATININE 2.40* 2.45* 2.58* 2.22* 1.91*  CALCIUM 8.8* 8.7* 9.0 8.2* 8.2*   GFR: Estimated Creatinine Clearance: 43.5 mL/min (A) (by C-G formula based on SCr of 1.91 mg/dL (H)). Liver Function Tests: Recent Labs  Lab 04/30/21 1252  AST 10*  ALT 12  ALKPHOS 64  BILITOT 0.6  PROT 5.9*  ALBUMIN 3.7   Recent Labs  Lab 04/30/21 1252  LIPASE 13   No results for input(s): AMMONIA in the last 168 hours. Coagulation Profile: Recent Labs  Lab 05/02/21 0629  INR 1.5*   Cardiac Enzymes: No results for input(s): CKTOTAL, CKMB, CKMBINDEX, TROPONINI in the last 168 hours. BNP (last 3 results) No results for input(s): PROBNP in the last 8760 hours. HbA1C: No results for input(s): HGBA1C in the last 72  hours.  CBG: Recent Labs  Lab 05/03/21 1649 05/03/21 2009 05/04/21 0006 05/04/21 0416 05/04/21 0742  GLUCAP 252* 206* 178* 177* 174*   Lipid Profile: No results for input(s): CHOL, HDL, LDLCALC, TRIG, CHOLHDL, LDLDIRECT in the last 72 hours. Thyroid Function Tests: No results for input(s): TSH, T4TOTAL, FREET4, T3FREE, THYROIDAB in the last 72 hours. Anemia Panel: No results for input(s): VITAMINB12, FOLATE, FERRITIN, TIBC, IRON, RETICCTPCT in the last 72 hours. Sepsis Labs: No results for input(s): PROCALCITON, LATICACIDVEN in the last 168 hours.  Recent Results (from the past 240 hour(s))  Resp Panel by RT-PCR (Flu A&B, Covid) Nasopharyngeal Swab     Status: None   Collection Time: 04/30/21  2:58 PM   Specimen: Nasopharyngeal Swab; Nasopharyngeal(NP) swabs in vial transport medium  Result Value Ref Range Status   SARS Coronavirus 2 by RT PCR NEGATIVE NEGATIVE Final    Comment: (NOTE) SARS-CoV-2 target nucleic acids are NOT DETECTED.  The SARS-CoV-2 RNA is generally detectable in upper respiratory specimens during the acute phase of infection. The lowest concentration of  SARS-CoV-2 viral copies this assay can detect is 138 copies/mL. A negative result does not preclude SARS-Cov-2 infection and should not be used as the sole basis for treatment or other patient management decisions. A negative result may occur with  improper specimen collection/handling, submission of specimen other than nasopharyngeal swab, presence of viral mutation(s) within the areas targeted by this assay, and inadequate number of viral copies(<138 copies/mL). A negative result must be combined with clinical observations, patient history, and epidemiological information. The expected result is Negative.  Fact Sheet for Patients:  EntrepreneurPulse.com.au  Fact Sheet for Healthcare Providers:  IncredibleEmployment.be  This test is no t yet approved or cleared by the [place] FDA and  has been authorized for detection and/or diagnosis of SARS-CoV-2 by FDA under an Emergency Use Authorization (EUA). This EUA will remain  in effect (meaning this test can be used) for the duration of the COVID-19 declaration under Section 564(b)(1) of the Act, 21 U.S.C.section 360bbb-3(b)(1), unless the authorization is terminated  or revoked sooner.       Influenza A by PCR NEGATIVE NEGATIVE Final   Influenza B by PCR NEGATIVE NEGATIVE Final    Comment: (NOTE) The Xpert Xpress SARS-CoV-2/FLU/RSV plus assay is intended as an aid in the diagnosis of influenza from Nasopharyngeal swab specimens and should not be used as a sole basis for treatment. Nasal washings and aspirates are unacceptable for Xpert Xpress SARS-CoV-2/FLU/RSV testing.  Fact Sheet for Patients: EntrepreneurPulse.com.au  Fact Sheet for Healthcare Providers: IncredibleEmployment.be  This test is not yet approved or cleared by the [place] FDA and has been authorized for detection and/or diagnosis of SARS-CoV-2 by FDA under an Emergency Use  Authorization (EUA). This EUA will remain in effect (meaning this test can be used) for the duration of the COVID-19 declaration under Section 564(b)(1) of the Act, 21 U.S.C. section 360bbb-3(b)(1), unless the authorization is terminated or revoked.  Performed at [facility], [address] [place], [place] [address]   Culture, blood (routine x 2)     Status: None (Preliminary result)   Collection Time: 05/02/21 10:00 AM   Specimen: BLOOD  Result Value Ref Range Status   Specimen Description   Final    BLOOD LEFT ANTECUBITAL Performed at [facility] [address], [place], [place] [address]    Special Requests   Final    BOTTLES DRAWN AEROBIC ONLY Blood Culture adequate volume  Performed at [facility], [place] [address] [place], [place] [address]    Culture   Final    NO GROWTH 2 DAYS Performed at [facility] [address] [place], [place] [address]    Report Status PENDING  Incomplete  Culture, blood (routine x 2)     Status: None (Preliminary result)   Collection Time: 05/02/21 10:00 AM   Specimen: BLOOD  Result Value Ref Range Status   Specimen Description   Final    BLOOD BLOOD LEFT HAND Performed at [facility] [address] [place], [place] [address]    Special Requests   Final    BOTTLES DRAWN AEROBIC ONLY Blood Culture adequate volume Performed at [facility] [address], [place], [place] [address]    Culture   Final    NO GROWTH 2 DAYS Performed at [facility] [address] [place], [place] [address]    Report Status PENDING  Incomplete         Radiology Studies: No results found.      Scheduled Meds:  amiodarone  200 mg Oral Daily   atorvastatin  20 mg Oral QPM   Chlorhexidine Gluconate Cloth  6 each Topical Daily   insulin aspart  0-9 Units Subcutaneous Q4H   metoprolol tartrate  25 mg Oral TID   prednisoLONE acetate   1 drop Left Eye BID   terazosin  2 mg Oral QHS   Continuous Infusions:  sodium chloride 150 mL/hr at 05/03/21 0536   cefTRIAXone (ROCEPHIN)  IV 2 g (05/03/21 1038)   heparin 1,800 Units/hr (05/03/21 2315)     LOS: 4 days    Time spent: 25 mins.More than 50% of that time was spent in counseling and/or coordination of care.      Shelly Coss, MD [facility] P6/20/2022, 8:22 AM

## 2021-05-05 ENCOUNTER — Other Ambulatory Visit: Payer: Self-pay | Admitting: Oncology

## 2021-05-05 DIAGNOSIS — R591 Generalized enlarged lymph nodes: Secondary | ICD-10-CM

## 2021-05-05 LAB — BASIC METABOLIC PANEL
Anion gap: 8 (ref 5–15)
BUN: 23 mg/dL (ref 8–23)
CO2: 26 mmol/L (ref 22–32)
Calcium: 8.7 mg/dL — ABNORMAL LOW (ref 8.9–10.3)
Chloride: 108 mmol/L (ref 98–111)
Creatinine, Ser: 1.57 mg/dL — ABNORMAL HIGH (ref 0.61–1.24)
GFR, Estimated: 46 mL/min — ABNORMAL LOW (ref >60)
Glucose, Bld: 182 mg/dL — ABNORMAL HIGH (ref 70–99)
Potassium: 3.7 mmol/L (ref 3.5–5.1)
Sodium: 142 mmol/L (ref 135–145)

## 2021-05-05 LAB — GLUCOSE, CAPILLARY
Glucose-Capillary: 152 mg/dL — ABNORMAL HIGH (ref 70–99)
Glucose-Capillary: 165 mg/dL — ABNORMAL HIGH (ref 70–99)
Glucose-Capillary: 173 mg/dL — ABNORMAL HIGH (ref 70–99)
Glucose-Capillary: 265 mg/dL — ABNORMAL HIGH (ref 70–99)
Glucose-Capillary: 266 mg/dL — ABNORMAL HIGH (ref 70–99)

## 2021-05-05 LAB — CBC
HCT: 33.8 % — ABNORMAL LOW (ref 39.0–52.0)
Hemoglobin: 10.6 g/dL — ABNORMAL LOW (ref 13.0–17.0)
MCH: 26 pg (ref 26.0–34.0)
MCHC: 31.4 g/dL (ref 30.0–36.0)
MCV: 83 fL (ref 80.0–100.0)
Platelets: 198 10*3/uL (ref 150–400)
RBC: 4.07 MIL/uL — ABNORMAL LOW (ref 4.22–5.81)
RDW: 14.7 % (ref 11.5–15.5)
WBC: 2.7 10*3/uL — ABNORMAL LOW (ref 4.0–10.5)
nRBC: 0 % (ref 0.0–0.2)

## 2021-05-05 MED ORDER — APIXABAN 5 MG PO TABS
5.0000 mg | ORAL_TABLET | Freq: Two times a day (BID) | ORAL | Status: DC
  Administered 2021-05-05: 5 mg via ORAL
  Filled 2021-05-05: qty 1

## 2021-05-05 MED ORDER — ISOSORBIDE MONONITRATE ER 30 MG PO TB24
30.0000 mg | ORAL_TABLET | Freq: Every day | ORAL | Status: DC
  Administered 2021-05-05: 30 mg via ORAL
  Filled 2021-05-05: qty 1

## 2021-05-05 MED ORDER — METOPROLOL TARTRATE 50 MG PO TABS
50.0000 mg | ORAL_TABLET | Freq: Two times a day (BID) | ORAL | 1 refills | Status: DC
Start: 2021-05-05 — End: 2022-12-30

## 2021-05-05 MED ORDER — AMLODIPINE BESYLATE 10 MG PO TABS: 10.0000 mg | ORAL_TABLET | Freq: Every day | ORAL | 1 refills | Status: DC

## 2021-05-05 MED ORDER — CLOPIDOGREL BISULFATE 75 MG PO TABS
75.0000 mg | ORAL_TABLET | Freq: Every day | ORAL | Status: DC
  Administered 2021-05-05: 75 mg via ORAL
  Filled 2021-05-05: qty 1

## 2021-05-05 NOTE — Discharge Summary (Signed)
Physician Discharge Summary  [name] EGB:[id number] DOB: [birth year] DOA: 04/30/2021  PCP: [name], MD  Admit date: 04/30/2021 Discharge date: 05/05/2021  Admitted From: Home Disposition:  Home  Discharge Condition:Stable CODE STATUS:FULL Diet recommendation: Heart Healthy  Brief/Interim Summary: Patient is a 75 year old male with history of diabetes type 2, hypertension, coronary disease status post PCI with a stent placement in January of this year, on Plavix, paroxysmal A. fib on Eliquis and amiodarone, diabetes type 2, CKD stage IIIa who presents to the emergency department with complaints of right-sided abdominal pain.  Symptoms started about 2 weeks ago.  On presentation his creatinine was found to be 2.4.  Hemoglobin was 9.7.  Abdominal CT showed multiple lymphadenopathy suspicious for lymphoma, right hydronephrosis due to possible right ureteral mass.  Urology consulted and he underwent cystoscopy with right-sided ureteral stent placement on 05/01/21.  IR consulted for biopsy of the lymph node to rule out lymphoma, s/p biopsy.  His biopsy will be followed by oncology and he will call for outpatient follow-up if needed.  He will follow-up with urology as an outpatient.  Patient is medically stable for discharge to home today.  Following problems were addressed during his hospitalization:  Right-sided hydronephrosis: Underwent right-sided ureteral stent placement by urology.  Denies any abdominal pain.  Having good urine output   AKI on CKD stage IIIa: Last creatinine was 1.4 as per Feb 22.  Presented with creatinine of 2.4.  Likely associated with obstructive pathology.  Kidney function didn't improve quickly most likely  secondary to vigorous urine output causing volume depletion.  He was also on lisinopril at home which has been held.  Kidney function gradually improved with IV fluids and currently at baseline.  Pelvic masses/abdominal lymphadenopathy:Pathologic  mesenteric, retroperitoneal, and pelvic adenopathy.  IR consulted, status post biopsy from the lymph node.  We have provided his information to oncology.  Oncology will follow up on the biopsy report and call for follow-up ointment.  Discussed with Dr. Learta Codding    History of coronary artery disease: Takes Eliquis and Plavix at home.  Continue.  Follow-up with cardiology as an outpatient  Leukopenia/normocytic anemia: Likely associated with underlying possible lymphoma..  Continue to monitor as an outpatient   Paroxysmal A. fib: Currently rate is well controlled.  On Eliquis for anticoagulation at home.  Takes beta-blocker and amiodarone.  Currently he is in normal sinus rhythm.  Hypertension: Mildly hypertensive today.  We will continue amlodipine, Imdur, metoprolol on discharge.  We have recommended him to closely follow-up with his PCP for the management of his hypertension   diabetes type 2: On metformin, Lantus at home.    Lung nodule: 6 mm of right middle lobe pulmonary nodule.  Needs follow-up as an outpatient.   Morbid obesity: BMI of 35.8    Discharge Diagnoses:  Principal Problem:   Lymphadenopathy Active Problems:   Hydronephrosis of right kidney   AKI (acute kidney injury) ([facility])   CAD (coronary artery disease)   DM2 (diabetes mellitus, type 2) ([facility])   PAF (paroxysmal atrial fibrillation) ([facility])   HTN (hypertension)    Discharge Instructions  Discharge Instructions     Diet - low sodium heart healthy   Complete by: As directed    Discharge instructions   Complete by: As directed    1)Please take prescribed medications as instructed. 2)Follow up with your PCP in a week.  Monitor your blood pressure at home.  Follow-up with your PCP closely for the management of her blood  pressure.  We have changed your blood pressure medications 3)Follow up with urology in 1 to 2 weeks.  Name and number the provider has been attached 4)You will be called by oncology for the follow-up  appointment.Your biopsy will be followed   Increase activity slowly   Complete by: As directed       Allergies as of 05/05/2021   No Known Allergies      Medication List     STOP taking these medications    hydrochlorothiazide 25 MG tablet Commonly known as: HYDRODIURIL   lisinopril 20 MG tablet Commonly known as: ZESTRIL       TAKE these medications    amiodarone 200 MG tablet Commonly known as: PACERONE Take 200 mg by mouth daily.   amLODipine 10 MG tablet Commonly known as: NORVASC Take 1 tablet (10 mg total) by mouth daily.   atorvastatin 20 MG tablet Commonly known as: LIPITOR Take 20 mg by mouth every evening.   clopidogrel 75 MG tablet Commonly known as: PLAVIX Take 75 mg by mouth daily.   Eliquis 5 MG Tabs tablet Generic drug: apixaban Take 5 mg by mouth 2 (two) times daily.   glipiZIDE 10 MG tablet Commonly known as: GLUCOTROL Take 10 mg by mouth 2 (two) times daily.   isosorbide mononitrate 30 MG 24 hr tablet Commonly known as: IMDUR Take 30 mg by mouth daily.   Lantus SoloStar 100 UNIT/ML Solostar Pen Generic drug: insulin glargine Inject 34 Units into the skin every evening.   metFORMIN 1000 MG tablet Commonly known as: GLUCOPHAGE Take 1,000 mg by mouth in the morning and at bedtime.   metoprolol tartrate 50 MG tablet Commonly known as: LOPRESSOR Take 1 tablet (50 mg total) by mouth 2 (two) times daily. What changed:  medication strength how much to take when to take this additional instructions   nitroGLYCERIN 0.3 MG SL tablet Commonly known as: NITROSTAT Place 0.3 mg under the tongue every 5 (five) minutes x 3 doses as needed for chest pain.   prednisoLONE acetate 1 % ophthalmic suspension Commonly known as: PRED FORTE Place 1 drop into the left eye 2 (two) times daily.   terazosin 2 MG capsule Commonly known as: HYTRIN Take 2 mg by mouth at bedtime.        Follow-up Information     [name], MD. Schedule  an appointment as soon as possible for a visit.   Specialty: Urology Why: [facility] Contact information: [address] [place] [address] [phone]         [name], MD. Schedule an appointment as soon as possible for a visit in 1 week(s).   Specialty: Family Medicine Contact information: [address] [place] [address] [phone]                No Known Allergies  Consultations: IR,urology   Procedures/Studies: CT Abdomen Pelvis Wo Contrast  Result Date: 04/30/2021 CLINICAL DATA:  Right lower quadrant abdominal pain EXAM: CT ABDOMEN AND PELVIS WITHOUT CONTRAST TECHNIQUE: Multidetector CT imaging of the abdomen and pelvis was performed following the standard protocol without IV contrast. COMPARISON:  None. FINDINGS: Lower chest: 6 mm mean diameter noncalcified pulmonary nodule is seen within the visualized right middle lobe, indeterminate. Scattered ground-glass pulmonary infiltrates in the noted at the lung bases bilaterally with subtle, smooth interlobular septal thickening possibly representing changes of trace pulmonary edema, possibly cardiogenic in nature. Extensive multi-vessel coronary artery calcification is present with probable  stenting of the distal right coronary artery. Global cardiac size within normal limits. No pericardial effusion. Hypoattenuation of the cardiac blood pool is in keeping with at least mild anemia. Hepatobiliary: No focal liver abnormality is seen. No gallstones, gallbladder wall thickening, or biliary dilatation. Pancreas: Unremarkable Spleen: There is moderate splenomegaly with the spleen measuring 17.2 cm in greatest dimension. No definite intrasplenic lesion identified on this noncontrast examination. Adrenals/Urinary Tract: The adrenal glands are unremarkable. The kidneys are normal in size and position. There is mild right hydronephrosis secondary to an obstructing lobulated mass in the  region of the right ureteropelvic junction measuring 3.1 x 4.5 x 3.8 cm on axial image # 58 and coronal image # 67 while difficult to accurately characterize in the absence of contrast administration, the mass appears external to the right ureter and demonstrates occlusion of the a proximal right ureter likely related to extrinsic mass effect, best appreciated on coronal imaging. Mild bilateral nonspecific perinephric stranding. No intrarenal or ureteral calculi are identified no hydronephrosis on the left. Appearing calcifications along the posterior wall of the bladder in the region of the ureteropelvic junction may represent posterior layering calculi within the bladder lumen or may be artifactual and related to streak artifact from right total hip arthroplasty. Stomach/Bowel: The stomach, small bowel, and large bowel are unremarkable. No evidence of obstruction or focal inflammation. The appendix is normal. No free intraperitoneal gas or fluid. Vascular/Lymphatic: There is extensive mesenteric infiltration. Additionally, there is pathologic mesenteric adenopathy with the dominant mass measuring 6.7 x 4.1 cm at axial image # 44/2. Multiple additional pathologically enlarged mesenteric lymph nodes are identified demonstrating irregular margins. There is, additionally, pathologic bilateral common iliac and external iliac lymphadenopathy as well as right pelvic sidewall lymphadenopathy with the index right common iliac lymph node measuring 1.8 x 3.7 cm at axial image # 72/2. Borderline pathologic aortocaval and left periaortic lymphadenopathy is present. There is moderate aortoiliac atherosclerotic calcification. No aortic aneurysm. Reproductive: Mild prostatic enlargement. Seminal vesicles are unremarkable. Other: Soft tissue nodule within the right retroperitoneum adjacent to the right psoas muscle may represent pathologic retroperitoneal adenopathy or a metastatic implant. This measures 1.7 x 3.0 cm at axial  image # 63/2. 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 Degenerative changes are seen within the lumbar spine. No lytic or blastic bone lesions are identified. IMPRESSION: Pathologic mesenteric, retroperitoneal, and pelvic adenopathy, as described above. Moderate splenomegaly. Lobulated retroperitoneal soft tissue masses in the region of the right ureteropelvic junction likely resulting in mild right hydronephrosis secondary to extrinsic mass effect as well as within the right retroperitoneum. 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 Less likely, this may represent metastatic disease secondary to a primary urothelial malignancy or potentially metastatic disease related to underlying prostate cancer, however, the nodal distribution would be unusual for the former and the lack of osseous metastatic disease given the extensive nodal involvement would be unusual for the latter. PET CT examination may be more helpful for further evaluation as well as identification of a a optimal tissue sampling target. Pathologic right external iliac lymphadenopathy may be amenable to ultrasound-guided sampling for definitive diagnosis. 6 mm of right middle lobe pulmonary nodule. Non-contrast chest CT at 6-12 months is recommended. If the nodule is stable at time of repeat CT, then future CT at 18-24 months (from today's scan) is considered optional for low-risk patients, but is recommended for high-risk patients. This recommendation follows the consensus statement: Guidelines for Management of Incidental Pulmonary Nodules  Detected on CT Images: From the Fleischner Society 2017; Radiology 2017; [id number]. Extensive coronary artery calcification. Scattered ground-glass pulmonary infiltrates and subtle smooth interlobular septal thickening may reflect changes of mild cardiogenic failure. At least mild anemia. Aortic Atherosclerosis (ICD10-I70.0). Electronically Signed   By: Fidela Salisbury MD    On: 04/30/2021 14:38   DG C-Arm 1-60 Min-No Report  Result Date: 05/01/2021 Fluoroscopy was utilized by the requesting physician.  No radiographic interpretation.   CT RENAL BIOPSY  Result Date: 05/04/2021 INDICATION: 75 year old male with right infrarenal retroperitoneal mass and abdominal lymphadenopathy of indeterminate etiology. EXAM: CT BIOPSY CORE RENAL COMPARISON:  05/30/2021 MEDICATIONS: None. ANESTHESIA/SEDATION: Fentanyl 100 mcg IV; Versed 2 mg IV Sedation time: 13 minutes; The patient was continuously monitored during the procedure by the interventional radiology nurse under my direct supervision. CONTRAST:  None. COMPLICATIONS: None immediate. PROCEDURE: Informed consent was obtained from the patient following an explanation of the procedure, risks, benefits and alternatives. A time out was performed prior to the initiation of the procedure. The patient was positioned prone on the CT table and a limited CT was performed for procedural planning demonstrating similar appearing soft tissue mass about the inferior aspect of the right kidney which appears to surround the proximal right ureter. The procedure was planned. The operative site was prepped and draped in the usual sterile fashion. Appropriate trajectory was confirmed with a 22 gauge spinal needle after the adjacent tissues were anesthetized with 1% Lidocaine with epinephrine. Under intermittent CT guidance, a 17 gauge coaxial needle was advanced into the peripheral aspect of the mass. Appropriate positioning was confirmed and total of 3 samples were obtained with an 18 gauge core needle biopsy device. The co-axial needle was removed while injecting Gel-Foam slurry and hemostasis was achieved with manual compression. A limited postprocedural CT was negative for hemorrhage or additional complication. A dressing was placed. The patient tolerated the procedure well without immediate postprocedural complication. IMPRESSION: Technically successful  CT guided core needle biopsy of right infrarenal retroperitoneal mass. Ruthann Cancer, MD [facility] Electronically Signed   By: Ruthann Cancer MD   On: 05/04/2021 14:23      Subjective:  Patient seen and examined the bedside this morning.  Hemodynamically stable for discharge today.  I called the daughter and discussed about the discharge planning.   Discharge Exam: Vitals:   05/04/21 1959 05/05/21 0501  BP: (!) 178/88 (!) 169/76  Pulse: 72 60  Resp: 18 18  Temp: 97.8 F (36.6 C) 98 F (36.7 C)  SpO2: 99% 97%   Vitals:   05/04/21 1300 05/04/21 1331 05/04/21 1959 05/05/21 0501  BP:  (!) 153/93 (!) 178/88 (!) 169/76  Pulse: 61 61 72 60  Resp: 14 18 18 18   Temp:  97.6 F (36.4 C) 97.8 F (36.6 C) 98 F (36.7 C)  TempSrc:  Oral    SpO2: 100% 98% 99% 97%  Weight:      Height:        General: Pt is alert, awake, not in acute distress Cardiovascular: RRR, S1/S2 +, no rubs, no gallops Respiratory: CTA bilaterally, no wheezing, no rhonchi Abdominal: Soft, NT, ND, bowel sounds + Extremities: no edema, no cyanosis    The results of significant diagnostics from this hospitalization (including imaging, microbiology, ancillary and laboratory) are listed below for reference.     Microbiology: Recent Results (from the past 240 hour(s))  Resp Panel by RT-PCR (Flu A&B, Covid) Nasopharyngeal Swab  Status: None   Collection Time: 04/30/21  2:58 PM   Specimen: Nasopharyngeal Swab; Nasopharyngeal(NP) swabs in vial transport medium  Result Value Ref Range Status   SARS Coronavirus 2 by RT PCR NEGATIVE NEGATIVE Final    Comment: (NOTE) SARS-CoV-2 target nucleic acids are NOT DETECTED.  The SARS-CoV-2 RNA is generally detectable in upper respiratory specimens during the acute phase of infection. The lowest concentration of SARS-CoV-2 viral copies this assay can detect is 138 copies/mL. A negative result does not preclude  SARS-Cov-2 infection and should not be used as the sole basis for treatment or other patient management decisions. A negative result may occur with  improper specimen collection/handling, submission of specimen other than nasopharyngeal swab, presence of viral mutation(s) within the areas targeted by this assay, and inadequate number of viral copies(<138 copies/mL). A negative result must be combined with clinical observations, patient history, and epidemiological information. The expected result is Negative.  Fact Sheet for Patients:  EntrepreneurPulse.com.au  Fact Sheet for Healthcare Providers:  IncredibleEmployment.be  This test is no t yet approved or cleared by the [place] FDA and  has been authorized for detection and/or diagnosis of SARS-CoV-2 by FDA under an Emergency Use Authorization (EUA). This EUA will remain  in effect (meaning this test can be used) for the duration of the COVID-19 declaration under Section 564(b)(1) of the Act, 21 U.S.C.section 360bbb-3(b)(1), unless the authorization is terminated  or revoked sooner.       Influenza A by PCR NEGATIVE NEGATIVE Final   Influenza B by PCR NEGATIVE NEGATIVE Final    Comment: (NOTE) The Xpert Xpress SARS-CoV-2/FLU/RSV plus assay is intended as an aid in the diagnosis of influenza from Nasopharyngeal swab specimens and should not be used as a sole basis for treatment. Nasal washings and aspirates are unacceptable for Xpert Xpress SARS-CoV-2/FLU/RSV testing.  Fact Sheet for Patients: EntrepreneurPulse.com.au  Fact Sheet for Healthcare Providers: IncredibleEmployment.be  This test is not yet approved or cleared by the [place] FDA and has been authorized for detection and/or diagnosis of SARS-CoV-2 by FDA under an Emergency Use Authorization (EUA). This EUA will remain in effect (meaning this test can be used) for the duration of  the COVID-19 declaration under Section 564(b)(1) of the Act, 21 U.S.C. section 360bbb-3(b)(1), unless the authorization is terminated or revoked.  Performed at [facility], [address] [place], [place] [address]   Culture, blood (routine x 2)     Status: None (Preliminary result)   Collection Time: 05/02/21 10:00 AM   Specimen: BLOOD  Result Value Ref Range Status   Specimen Description   Final    BLOOD LEFT ANTECUBITAL Performed at [facility] [address], [place], [place] [address]    Special Requests   Final    BOTTLES DRAWN AEROBIC ONLY Blood Culture adequate volume Performed at [facility] [address] [place], [place] [address]    Culture   Final    NO GROWTH 3 DAYS Performed at [facility], [place] [address] [place], [place] [address]    Report Status PENDING  Incomplete  Culture, blood (routine x 2)     Status: None (Preliminary result)   Collection Time: 05/02/21 10:00 AM   Specimen: BLOOD  Result Value Ref Range Status   Specimen Description   Final    BLOOD BLOOD LEFT HAND Performed at [facility] [address], [place], [place] [address]    Special Requests  Final    BOTTLES DRAWN AEROBIC ONLY Blood Culture adequate volume Performed at [facility] [address], [place], [place] [address]    Culture   Final    NO GROWTH 3 DAYS Performed at [facility], [place] [address] [place], [place] [address]    Report Status PENDING  Incomplete     Labs: BNP (last 3 results) No results for input(s): BNP in the last 8760 hours. Basic Metabolic Panel: Recent Labs  Lab 05/01/21 0515 05/02/21 0629 05/03/21 0906 05/04/21 0424 05/05/21 0556  NA 139 138 139 142 142  K 4.4 4.5 3.8 3.9 3.7  CL 109 106 108 111 108  CO2 21* 26 26 24 26   GLUCOSE 123* 228* 200* 192* 182*  BUN 31* 37* 38* 27* 23  CREATININE 2.45* 2.58* 2.22* 1.91* 1.57*   CALCIUM 8.7* 9.0 8.2* 8.2* 8.7*   Liver Function Tests: Recent Labs  Lab 04/30/21 1252  AST 10*  ALT 12  ALKPHOS 64  BILITOT 0.6  PROT 5.9*  ALBUMIN 3.7   Recent Labs  Lab 04/30/21 1252  LIPASE 13   No results for input(s): AMMONIA in the last 168 hours. CBC: Recent Labs  Lab 05/01/21 0515 05/02/21 0629 05/03/21 0638 05/04/21 0424 05/05/21 0556  WBC 2.0* 4.9 3.2* 3.0* 2.7*  NEUTROABS  --  2.7  --   --   --   HGB 9.7* 11.1* 9.7* 10.0* 10.6*  HCT 30.4* 34.7* 30.8* 31.5* 33.8*  MCV 81.3 81.3 82.4 82.0 83.0  PLT 164 189 165 181 198   Cardiac Enzymes: No results for input(s): CKTOTAL, CKMB, CKMBINDEX, TROPONINI in the last 168 hours. BNP: Invalid input(s): POCBNP CBG: Recent Labs  Lab 05/04/21 1625 05/04/21 2016 05/04/21 2359 05/05/21 0453 05/05/21 0717  GLUCAP 245* 345* 152* 173* 165*   D-Dimer No results for input(s): DDIMER in the last 72 hours. Hgb A1c No results for input(s): HGBA1C in the last 72 hours. Lipid Profile No results for input(s): CHOL, HDL, LDLCALC, TRIG, CHOLHDL, LDLDIRECT in the last 72 hours. Thyroid function studies No results for input(s): TSH, T4TOTAL, T3FREE, THYROIDAB in the last 72 hours.  Invalid input(s): FREET3 Anemia work up No results for input(s): VITAMINB12, FOLATE, FERRITIN, TIBC, IRON, RETICCTPCT in the last 72 hours. Urinalysis    Component Value Date/Time   COLORURINE YELLOW 04/30/2021 1242   APPEARANCEUR CLEAR 04/30/2021 1242   LABSPEC 1.017 04/30/2021 1242   PHURINE 5.5 04/30/2021 1242   GLUCOSEU NEGATIVE 04/30/2021 1242   HGBUR LARGE (A) 04/30/2021 1242   BILIRUBINUR NEGATIVE 04/30/2021 1242   KETONESUR NEGATIVE 04/30/2021 1242   PROTEINUR TRACE (A) 04/30/2021 1242   NITRITE NEGATIVE 04/30/2021 1242   LEUKOCYTESUR NEGATIVE 04/30/2021 1242   Sepsis Labs Invalid input(s): PROCALCITONIN,  WBC,  LACTICIDVEN Microbiology Recent Results (from the past 240 hour(s))  Resp Panel by RT-PCR (Flu A&B, Covid)  Nasopharyngeal Swab     Status: None   Collection Time: 04/30/21  2:58 PM   Specimen: Nasopharyngeal Swab; Nasopharyngeal(NP) swabs in vial transport medium  Result Value Ref Range Status   SARS Coronavirus 2 by RT PCR NEGATIVE NEGATIVE Final    Comment: (NOTE) SARS-CoV-2 target nucleic acids are NOT DETECTED.  The SARS-CoV-2 RNA is generally detectable in upper respiratory specimens during the acute phase of infection. The lowest concentration of SARS-CoV-2 viral copies this assay can detect is 138 copies/mL. A negative result does not preclude SARS-Cov-2 infection and should not be used as the sole basis  for treatment or other patient management decisions. A negative result may occur with  improper specimen collection/handling, submission of specimen other than nasopharyngeal swab, presence of viral mutation(s) within the areas targeted by this assay, and inadequate number of viral copies(<138 copies/mL). A negative result must be combined with clinical observations, patient history, and epidemiological information. The expected result is Negative.  Fact Sheet for Patients:  EntrepreneurPulse.com.au  Fact Sheet for Healthcare Providers:  IncredibleEmployment.be  This test is no t yet approved or cleared by the [place] FDA and  has been authorized for detection and/or diagnosis of SARS-CoV-2 by FDA under an Emergency Use Authorization (EUA). This EUA will remain  in effect (meaning this test can be used) for the duration of the COVID-19 declaration under Section 564(b)(1) of the Act, 21 U.S.C.section 360bbb-3(b)(1), unless the authorization is terminated  or revoked sooner.       Influenza A by PCR NEGATIVE NEGATIVE Final   Influenza B by PCR NEGATIVE NEGATIVE Final    Comment: (NOTE) The Xpert Xpress SARS-CoV-2/FLU/RSV plus assay is intended as an aid in the diagnosis of influenza from Nasopharyngeal swab specimens and should not be  used as a sole basis for treatment. Nasal washings and aspirates are unacceptable for Xpert Xpress SARS-CoV-2/FLU/RSV testing.  Fact Sheet for Patients: EntrepreneurPulse.com.au  Fact Sheet for Healthcare Providers: IncredibleEmployment.be  This test is not yet approved or cleared by the [place] FDA and has been authorized for detection and/or diagnosis of SARS-CoV-2 by FDA under an Emergency Use Authorization (EUA). This EUA will remain in effect (meaning this test can be used) for the duration of the COVID-19 declaration under Section 564(b)(1) of the Act, 21 U.S.C. section 360bbb-3(b)(1), unless the authorization is terminated or revoked.  Performed at [facility], [address] [place], [place] [address]   Culture, blood (routine x 2)     Status: None (Preliminary result)   Collection Time: 05/02/21 10:00 AM   Specimen: BLOOD  Result Value Ref Range Status   Specimen Description   Final    BLOOD LEFT ANTECUBITAL Performed at [facility] [address], [place], [place] [address]    Special Requests   Final    BOTTLES DRAWN AEROBIC ONLY Blood Culture adequate volume Performed at [facility] [address] [place], [place] [address]    Culture   Final    NO GROWTH 3 DAYS Performed at [facility] Hospital Lab, [facility] [address] [place], [place] [address]    Report Status PENDING  Incomplete  Culture, blood (routine x 2)     Status: None (Preliminary result)   Collection Time: 05/02/21 10:00 AM   Specimen: BLOOD  Result Value Ref Range Status   Specimen Description   Final    BLOOD BLOOD LEFT HAND Performed at [facility] [address] [place], [place] [address]    Special Requests   Final    BOTTLES DRAWN AEROBIC ONLY Blood Culture adequate volume Performed at [facility] [address] [place], [place] [address]    Culture    Final    NO GROWTH 3 DAYS Performed at [facility], [place] [address] [place],  [address]    Report Status PENDING  Incomplete    Please note: You were cared for by a hospitalist during your hospital stay. Once you are discharged, your primary care physician will handle any further medical issues. Please note that NO REFILLS for any  discharge medications will be authorized once you are discharged, as it is imperative that you return to your primary care physician (or establish a relationship with a primary care physician if you do not have one) for your post hospital discharge needs so that they can reassess your need for medications and monitor your lab values.    Time coordinating discharge: 40 minutes  SIGNED:   Shelly Coss, MD  [facility] 05/05/2021, 11:15 AM Pager [phone]  If 7PM-7AM, please contact night-coverage [email or web address] Password [email or web address]

## 2021-05-05 NOTE — Progress Notes (Signed)
Brief oncology note:  Consult received and chart reviewed with Dr. Benay Spice.  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  Biopsy currently pending.  Will see the patient as an outpatient once biopsy results are available to discuss diagnosis and treatment options.  Scheduling message has been sent to the new patient coordinator.  The patient will be notified with the date and time of the appointment.  Mikey Bussing, DNP, AGPCNP-BC, AOCNP

## 2021-05-05 NOTE — TOC Transition Note (Signed)
Transition of Care [facility]) - CM/SW Discharge Note   Patient Details  Name: [name] MRN: [id number] Date of Birth: 01-29-1946  Transition of Care [facility]) CM/SW Contact:  Leeroy Cha, RN Phone Number: 05/05/2021, 11:17 AM   Clinical Narrative:    Patient discharged to return to home with self care.  No hhc orders noted.   Final next level of care: Home/Self Care     Patient Goals and CMS Choice        Discharge Placement                       Discharge Plan and Services                                     Social Determinants of Health (SDOH) Interventions     Readmission Risk Interventions No flowsheet data found.

## 2021-05-06 ENCOUNTER — Observation Stay (HOSPITAL_COMMUNITY): Payer: Medicare HMO

## 2021-05-06 ENCOUNTER — Inpatient Hospital Stay (HOSPITAL_COMMUNITY)
Admission: EM | Admit: 2021-05-06 | Discharge: 2021-05-10 | DRG: 690 | Disposition: A | Discharge status: Home / Self Care | Payer: Medicare HMO | Attending: Family Medicine | Admitting: Family Medicine

## 2021-05-06 ENCOUNTER — Other Ambulatory Visit: Payer: Self-pay

## 2021-05-06 ENCOUNTER — Emergency Department (HOSPITAL_COMMUNITY): Payer: Medicare HMO

## 2021-05-06 ENCOUNTER — Encounter (HOSPITAL_COMMUNITY): Payer: Self-pay

## 2021-05-06 DIAGNOSIS — Z79899 Other long term (current) drug therapy: Secondary | ICD-10-CM

## 2021-05-06 DIAGNOSIS — Z794 Long term (current) use of insulin: Secondary | ICD-10-CM

## 2021-05-06 DIAGNOSIS — I472 Ventricular tachycardia: Secondary | ICD-10-CM | POA: Diagnosis present

## 2021-05-06 DIAGNOSIS — I480 Paroxysmal atrial fibrillation: Secondary | ICD-10-CM | POA: Diagnosis present

## 2021-05-06 DIAGNOSIS — I152 Hypertension secondary to endocrine disorders: Secondary | ICD-10-CM | POA: Diagnosis present

## 2021-05-06 DIAGNOSIS — N136 Pyonephrosis: Secondary | ICD-10-CM | POA: Diagnosis not present

## 2021-05-06 DIAGNOSIS — G4730 Sleep apnea, unspecified: Secondary | ICD-10-CM | POA: Diagnosis present

## 2021-05-06 DIAGNOSIS — Z8249 Family history of ischemic heart disease and other diseases of the circulatory system: Secondary | ICD-10-CM

## 2021-05-06 DIAGNOSIS — E1122 Type 2 diabetes mellitus with diabetic chronic kidney disease: Secondary | ICD-10-CM | POA: Diagnosis present

## 2021-05-06 DIAGNOSIS — I2510 Atherosclerotic heart disease of native coronary artery without angina pectoris: Secondary | ICD-10-CM | POA: Diagnosis present

## 2021-05-06 DIAGNOSIS — N390 Urinary tract infection, site not specified: Secondary | ICD-10-CM | POA: Diagnosis not present

## 2021-05-06 DIAGNOSIS — Z7902 Long term (current) use of antithrombotics/antiplatelets: Secondary | ICD-10-CM

## 2021-05-06 DIAGNOSIS — Z960 Presence of urogenital implants: Secondary | ICD-10-CM

## 2021-05-06 DIAGNOSIS — Z7984 Long term (current) use of oral hypoglycemic drugs: Secondary | ICD-10-CM

## 2021-05-06 DIAGNOSIS — N179 Acute kidney failure, unspecified: Secondary | ICD-10-CM | POA: Diagnosis present

## 2021-05-06 DIAGNOSIS — N1831 Chronic kidney disease, stage 3a: Secondary | ICD-10-CM | POA: Diagnosis present

## 2021-05-06 DIAGNOSIS — N189 Chronic kidney disease, unspecified: Secondary | ICD-10-CM | POA: Diagnosis present

## 2021-05-06 DIAGNOSIS — E119 Type 2 diabetes mellitus without complications: Secondary | ICD-10-CM

## 2021-05-06 DIAGNOSIS — Z20822 Contact with and (suspected) exposure to covid-19: Secondary | ICD-10-CM | POA: Diagnosis present

## 2021-05-06 DIAGNOSIS — G4733 Obstructive sleep apnea (adult) (pediatric): Secondary | ICD-10-CM | POA: Diagnosis present

## 2021-05-06 DIAGNOSIS — E1159 Type 2 diabetes mellitus with other circulatory complications: Secondary | ICD-10-CM | POA: Diagnosis present

## 2021-05-06 DIAGNOSIS — E785 Hyperlipidemia, unspecified: Secondary | ICD-10-CM | POA: Diagnosis present

## 2021-05-06 DIAGNOSIS — B965 Pseudomonas (aeruginosa) (mallei) (pseudomallei) as the cause of diseases classified elsewhere: Secondary | ICD-10-CM | POA: Diagnosis present

## 2021-05-06 DIAGNOSIS — R7881 Bacteremia: Secondary | ICD-10-CM | POA: Diagnosis present

## 2021-05-06 DIAGNOSIS — E1169 Type 2 diabetes mellitus with other specified complication: Secondary | ICD-10-CM | POA: Diagnosis present

## 2021-05-06 DIAGNOSIS — Z955 Presence of coronary angioplasty implant and graft: Secondary | ICD-10-CM

## 2021-05-06 DIAGNOSIS — Z7901 Long term (current) use of anticoagulants: Secondary | ICD-10-CM

## 2021-05-06 LAB — CBC WITH DIFFERENTIAL/PLATELET
Abs Immature Granulocytes: 0.05 10*3/uL (ref 0.00–0.07)
Basophils Absolute: 0 10*3/uL (ref 0.0–0.1)
Basophils Relative: 1 %
Eosinophils Absolute: 0 10*3/uL (ref 0.0–0.5)
Eosinophils Relative: 1 %
HCT: 28.5 % — ABNORMAL LOW (ref 39.0–52.0)
Hemoglobin: 9.2 g/dL — ABNORMAL LOW (ref 13.0–17.0)
Immature Granulocytes: 1 %
Lymphocytes Relative: 33 %
Lymphs Abs: 1.4 10*3/uL (ref 0.7–4.0)
MCH: 25.8 pg — ABNORMAL LOW (ref 26.0–34.0)
MCHC: 32.3 g/dL (ref 30.0–36.0)
MCV: 80.1 fL (ref 80.0–100.0)
Monocytes Absolute: 1.4 10*3/uL — ABNORMAL HIGH (ref 0.1–1.0)
Monocytes Relative: 31 %
Neutro Abs: 1.5 10*3/uL — ABNORMAL LOW (ref 1.7–7.7)
Neutrophils Relative %: 33 %
Platelets: 176 10*3/uL (ref 150–400)
RBC: 3.56 MIL/uL — ABNORMAL LOW (ref 4.22–5.81)
RDW: 14.8 % (ref 11.5–15.5)
WBC: 4.4 10*3/uL (ref 4.0–10.5)
nRBC: 0 % (ref 0.0–0.2)

## 2021-05-06 LAB — PROTIME-INR
INR: 2 — ABNORMAL HIGH (ref 0.8–1.2)
Prothrombin Time: 23 seconds — ABNORMAL HIGH (ref 11.4–15.2)

## 2021-05-06 LAB — URINALYSIS, ROUTINE W REFLEX MICROSCOPIC
Bilirubin Urine: NEGATIVE
Glucose, UA: 50 mg/dL — AB
Ketones, ur: NEGATIVE mg/dL
Nitrite: NEGATIVE
Protein, ur: 30 mg/dL — AB
RBC / HPF: >50 RBC/hpf — ABNORMAL HIGH (ref 0–5)
Specific Gravity, Urine: 1.008 (ref 1.005–1.030)
pH: 5 (ref 5.0–8.0)

## 2021-05-06 LAB — COMPREHENSIVE METABOLIC PANEL
ALT: 17 U/L (ref 0–44)
AST: 18 U/L (ref 15–41)
Albumin: 3 g/dL — ABNORMAL LOW (ref 3.5–5.0)
Alkaline Phosphatase: 55 U/L (ref 38–126)
Anion gap: 6 (ref 5–15)
BUN: 23 mg/dL (ref 8–23)
CO2: 25 mmol/L (ref 22–32)
Calcium: 8.3 mg/dL — ABNORMAL LOW (ref 8.9–10.3)
Chloride: 106 mmol/L (ref 98–111)
Creatinine, Ser: 1.84 mg/dL — ABNORMAL HIGH (ref 0.61–1.24)
GFR, Estimated: 38 mL/min — ABNORMAL LOW (ref >60)
Glucose, Bld: 178 mg/dL — ABNORMAL HIGH (ref 70–99)
Potassium: 3.4 mmol/L — ABNORMAL LOW (ref 3.5–5.1)
Sodium: 137 mmol/L (ref 135–145)
Total Bilirubin: 0.7 mg/dL (ref 0.3–1.2)
Total Protein: 5.3 g/dL — ABNORMAL LOW (ref 6.5–8.1)

## 2021-05-06 LAB — LACTIC ACID, PLASMA
Lactic Acid, Venous: 1.1 mmol/L (ref 0.5–1.9)
Lactic Acid, Venous: 1 mmol/L (ref 0.5–1.9)

## 2021-05-06 LAB — APTT: aPTT: 32 seconds (ref 24–36)

## 2021-05-06 LAB — RESP PANEL BY RT-PCR (FLU A&B, COVID) ARPGX2
Influenza A by PCR: NEGATIVE
Influenza B by PCR: NEGATIVE
SARS Coronavirus 2 by RT PCR: NEGATIVE

## 2021-05-06 LAB — GLUCOSE, CAPILLARY: Glucose-Capillary: 163 mg/dL — ABNORMAL HIGH (ref 70–99)

## 2021-05-06 MED ORDER — INSULIN ASPART 100 UNIT/ML IJ SOLN
0.0000 [IU] | Freq: Three times a day (TID) | INTRAMUSCULAR | Status: DC
  Administered 2021-05-07: 2 [IU] via SUBCUTANEOUS
  Administered 2021-05-07: 5 [IU] via SUBCUTANEOUS
  Administered 2021-05-07: 3 [IU] via SUBCUTANEOUS
  Administered 2021-05-08 (×2): 2 [IU] via SUBCUTANEOUS
  Administered 2021-05-08: 3 [IU] via SUBCUTANEOUS
  Administered 2021-05-09 (×2): 2 [IU] via SUBCUTANEOUS
  Administered 2021-05-09 – 2021-05-10 (×2): 5 [IU] via SUBCUTANEOUS
  Administered 2021-05-10: 3 [IU] via SUBCUTANEOUS
  Filled 2021-05-06: qty 0.09

## 2021-05-06 MED ORDER — CLOPIDOGREL BISULFATE 75 MG PO TABS
75.0000 mg | ORAL_TABLET | Freq: Every day | ORAL | Status: DC
  Administered 2021-05-07 – 2021-05-10 (×4): 75 mg via ORAL
  Filled 2021-05-06 (×4): qty 1

## 2021-05-06 MED ORDER — PREDNISOLONE ACETATE 1 % OP SUSP: 1.0000 [drp] | Freq: Two times a day (BID) | OPHTHALMIC | Status: DC

## 2021-05-06 MED ORDER — METOPROLOL TARTRATE 50 MG PO TABS
50.0000 mg | ORAL_TABLET | Freq: Two times a day (BID) | ORAL | Status: DC
  Administered 2021-05-06 – 2021-05-10 (×8): 50 mg via ORAL
  Filled 2021-05-06 (×8): qty 1

## 2021-05-06 MED ORDER — ACETAMINOPHEN 325 MG PO TABS: 650.0000 mg | ORAL_TABLET | Freq: Four times a day (QID) | ORAL | Status: DC | PRN

## 2021-05-06 MED ORDER — POTASSIUM CHLORIDE 20 MEQ PO PACK
40.0000 meq | PACK | Freq: Once | ORAL | Status: AC
  Administered 2021-05-06: 40 meq via ORAL
  Filled 2021-05-06: qty 2

## 2021-05-06 MED ORDER — APIXABAN 5 MG PO TABS
5.0000 mg | ORAL_TABLET | Freq: Two times a day (BID) | ORAL | Status: DC
  Administered 2021-05-06 – 2021-05-10 (×8): 5 mg via ORAL
  Filled 2021-05-06 (×8): qty 1

## 2021-05-06 MED ORDER — ONDANSETRON HCL 4 MG/2ML IJ SOLN: 4.0000 mg | Freq: Four times a day (QID) | INTRAMUSCULAR | Status: DC | PRN

## 2021-05-06 MED ORDER — SENNOSIDES-DOCUSATE SODIUM 8.6-50 MG PO TABS: 1.0000 | ORAL_TABLET | Freq: Every evening | ORAL | Status: DC | PRN

## 2021-05-06 MED ORDER — INSULIN GLARGINE 100 UNIT/ML ~~LOC~~ SOLN
25.0000 [IU] | Freq: Every day | SUBCUTANEOUS | Status: DC
  Administered 2021-05-06 – 2021-05-09 (×4): 25 [IU] via SUBCUTANEOUS
  Filled 2021-05-06 (×4): qty 0.25

## 2021-05-06 MED ORDER — LACTATED RINGERS IV SOLN: INTRAVENOUS | Status: AC

## 2021-05-06 MED ORDER — ATORVASTATIN CALCIUM 10 MG PO TABS
20.0000 mg | ORAL_TABLET | Freq: Every evening | ORAL | Status: DC
  Administered 2021-05-06 – 2021-05-09 (×4): 20 mg via ORAL
  Filled 2021-05-06 (×4): qty 2

## 2021-05-06 MED ORDER — LACTATED RINGERS IV SOLN: INTRAVENOUS | Status: DC

## 2021-05-06 MED ORDER — TERAZOSIN HCL 2 MG PO CAPS
2.0000 mg | ORAL_CAPSULE | Freq: Every day | ORAL | Status: DC
  Administered 2021-05-06 – 2021-05-09 (×4): 2 mg via ORAL
  Filled 2021-05-06 (×4): qty 1

## 2021-05-06 MED ORDER — ACETAMINOPHEN 650 MG RE SUPP: 650.0000 mg | Freq: Four times a day (QID) | RECTAL | Status: DC | PRN

## 2021-05-06 MED ORDER — AMIODARONE HCL 200 MG PO TABS
200.0000 mg | ORAL_TABLET | Freq: Every day | ORAL | Status: DC
  Administered 2021-05-07 – 2021-05-10 (×4): 200 mg via ORAL
  Filled 2021-05-06 (×4): qty 1

## 2021-05-06 MED ORDER — ISOSORBIDE MONONITRATE ER 30 MG PO TB24
30.0000 mg | ORAL_TABLET | Freq: Every day | ORAL | Status: DC
  Administered 2021-05-07 – 2021-05-10 (×4): 30 mg via ORAL
  Filled 2021-05-06 (×4): qty 1

## 2021-05-06 MED ORDER — SODIUM CHLORIDE 0.9% FLUSH
3.0000 mL | Freq: Two times a day (BID) | INTRAVENOUS | Status: DC
  Administered 2021-05-07 – 2021-05-10 (×6): 3 mL via INTRAVENOUS

## 2021-05-06 MED ORDER — SODIUM CHLORIDE 0.9 % IV SOLN
2.0000 g | Freq: Two times a day (BID) | INTRAVENOUS | Status: DC
  Administered 2021-05-07 – 2021-05-10 (×7): 2 g via INTRAVENOUS
  Filled 2021-05-06 (×8): qty 2

## 2021-05-06 MED ORDER — ACETAMINOPHEN 325 MG PO TABS
650.0000 mg | ORAL_TABLET | Freq: Once | ORAL | Status: AC
  Administered 2021-05-06: 650 mg via ORAL
  Filled 2021-05-06: qty 2

## 2021-05-06 MED ORDER — SODIUM CHLORIDE 0.9 % IV SOLN
2.0000 g | Freq: Once | INTRAVENOUS | Status: AC
  Administered 2021-05-06: 2 g via INTRAVENOUS
  Filled 2021-05-06: qty 2

## 2021-05-06 MED ORDER — ONDANSETRON HCL 4 MG PO TABS: 4.0000 mg | ORAL_TABLET | Freq: Four times a day (QID) | ORAL | Status: DC | PRN

## 2021-05-06 MED ORDER — AMLODIPINE BESYLATE 10 MG PO TABS
10.0000 mg | ORAL_TABLET | Freq: Every day | ORAL | Status: DC
  Administered 2021-05-07 – 2021-05-10 (×4): 10 mg via ORAL
  Filled 2021-05-06 (×5): qty 1

## 2021-05-06 NOTE — Sepsis Progress Note (Signed)
eLink is monitoring this Code Sepsis. °

## 2021-05-06 NOTE — ED Triage Notes (Signed)
EMS reports from home, had urinary stent placed on 6/20, had fever today and difficulty urinating and pain. Took 1gm Tylenol at home this afternoon.  Pt speaks [place]. Translation machine at beside  BP 136/67 HR 72 RR 28 Sp02 96 RA Temp 100.8 CBG 235  18ga L forearm 559ml NS

## 2021-05-06 NOTE — Progress Notes (Signed)
ELink surveillance for sepsis protocol

## 2021-05-06 NOTE — ED Notes (Signed)
Patient is ready for transport.  

## 2021-05-06 NOTE — Progress Notes (Signed)
Pharmacy Antibiotic Note  [name] is a 75 y.o. male admitted on 9/48/5462 with  complicated UTI . Pharmacy has been consulted for Cefepime dosing.  Plan: Cefepime 2g IV q12h Monitor renal function, cultures, clinical course     Temp (24hrs), Avg:97.9 F (36.6 C), Min:97.9 F (36.6 C), Max:97.9 F (36.6 C)  Recent Labs  Lab 05/02/21 0629 05/03/21 0638 05/03/21 0906 05/04/21 0424 05/05/21 0556 05/06/21 1747  WBC 4.9 3.2*  --  3.0* 2.7* 4.4  CREATININE 2.58*  --  2.22* 1.91* 1.57* 1.84*  LATICACIDVEN  --   --   --   --   --  1.1    Estimated Creatinine Clearance: 45.1 mL/min (A) (by C-G formula based on SCr of 1.84 mg/dL (H)).    No Known Allergies  Antimicrobials this admission: 6/22 Cefepime >>  Dose adjustments this admission: --  Microbiology results: 6/22 BCx: sent 6/22 UCx: sent   Thank you for allowing pharmacy to be a part of this patient's care.  Luiz Ochoa 05/06/2021 8:32 PM

## 2021-05-06 NOTE — ED Notes (Signed)
[place] at bedside. Pharmacy speaking to wife

## 2021-05-06 NOTE — ED Notes (Signed)
Per pt and pt's wife request, placed external male catheter to assist with urinating.

## 2021-05-06 NOTE — Progress Notes (Signed)
A consult was received from an ED physician for cefepime per pharmacy dosing.  The patient's profile has been reviewed for ht/wt/allergies/indication/available labs.   A one time order has been placed for cefepime 2 g per EDP.  Further antibiotics/pharmacy consults should be ordered by admitting physician if indicated.                       Thank you, Napoleon Form 05/06/2021  5:32 PM

## 2021-05-06 NOTE — ED Provider Notes (Signed)
[facility] DEPT Provider Note   CSN: [id number] Arrival date & time: 05/06/21  1525     History Chief Complaint  Patient presents with   Dysuria   Fever    [name] is a 75 y.o. male.  HPI     75 year old male with history of diabetes type 2, hypertension, coronary disease status post PCI with a stent placement in January of this year, on Plavix, paroxysmal A. fib on Eliquis and amiodarone, diabetes type 2, CKD stage IIIa who presents to the emergency department with complaints of frequent urination, pain with urination and fevers and chills.  Patient was just discharged from the hospital.  He had cystoscopy and ureteral stent placed on 6-17, 2 days ago his Foley catheter was removed and he was discharged to home yesterday.  Patient reports that yesterday he did notice that he was having increased urinary frequency -however today he started having burning with urination.  Subsequently he started having fevers and chills along with sweats, prompting family to call 911.  Patient took Tylenol prior to arriving to the ER.   Past Medical History:  Diagnosis Date   Anginal pain ([place])    Coronary artery disease    Diabetes mellitus without complication ([place])    Dysrhythmia    Hypertension    Sleep apnea     Patient Active Problem List   Diagnosis Date Noted   Complicated UTI (urinary tract infection) 05/06/2021   Sleep apnea    Hyperlipidemia associated with type 2 diabetes mellitus ([place])    Hydronephrosis of right kidney 05/01/2021   Acute kidney injury superimposed on CKD ([place]) 05/01/2021   CAD (coronary artery disease) 05/01/2021   DM2 (diabetes mellitus, type 2) ([place]) 05/01/2021   PAF (paroxysmal atrial fibrillation) ([place]) 05/01/2021   Hypertension associated with diabetes ([place]) 05/01/2021   Lymphadenopathy 04/30/2021    Past Surgical History:  Procedure Laterality Date   CARDIAC CATHETERIZATION     CYSTOSCOPY WITH RETROGRADE  PYELOGRAM, URETEROSCOPY AND STENT PLACEMENT Right 05/01/2021   Procedure: CYSTOSCOPY WITH RIGHT  RETROGRADE PYELOGRAM,  AND RIGHT STENT PLACEMENT;  Surgeon: Festus Aloe, MD;  Location: [facility];  Service: Urology;  Laterality: Right;   EYE SURGERY     INJECTION OF SILICONE OIL Left 83/15/1761   Procedure: INJECTION OF SILICONE OIL;  Surgeon: Sherlynn Stalls, MD;  Location: [facility];  Service: Ophthalmology;  Laterality: Left;   INJECTION OF SILICONE OIL Left 04/21/3709   Procedure: INJECTION OF SILICONE OIL;  Surgeon: Sherlynn Stalls, MD;  Location: [facility];  Service: Ophthalmology;  Laterality: Left;   JOINT REPLACEMENT Right    hip   LASER PHOTO ABLATION Left 12/25/2020   Procedure: LASER PHOTO ABLATION;  Surgeon: Sherlynn Stalls, MD;  Location: [facility];  Service: Ophthalmology;  Laterality: Left;   PARS PLANA VITRECTOMY Left 11/10/2020   Procedure: PARS PLANA VITRECTOMY WITH 25 GAUGE;  Surgeon: Sherlynn Stalls, MD;  Location: [facility];  Service: Ophthalmology;  Laterality: Left;   PARS PLANA VITRECTOMY Left 12/25/2020   Procedure: PARS PLANA VITRECTOMY WITH 25 GAUGE IN LEFT EYE;  Surgeon: Sherlynn Stalls, MD;  Location: [facility];  Service: Ophthalmology;  Laterality: Left;   PERFLUORONE INJECTION Left 12/25/2020   Procedure: PERFLUORONE INJECTION;  Surgeon: Sherlynn Stalls, MD;  Location: [facility];  Service: Ophthalmology;  Laterality: Left;   PHOTOCOAGULATION WITH LASER Left 11/10/2020   Procedure: PHOTOCOAGULATION WITH LASER;  Surgeon: Sherlynn Stalls, MD;  Location: [facility];  Service: Ophthalmology;  Laterality: Left;  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   Procedure: RETINECTOMY LEFT EYE;  Surgeon: Sherlynn Stalls, MD;  Location: [facility];  Service: Ophthalmology;  Laterality: Left;   SILICON OIL REMOVAL Left 6/94/8546   Procedure: SILICONE OIL REMOVAL;  Surgeon: Sherlynn Stalls, MD;  Location: [facility];  Service: Ophthalmology;  Laterality: Left;       Family History  Problem Relation Age  of Onset   CAD Other     Social History   Tobacco Use   Smoking status: Never   Smokeless tobacco: Never  Vaping Use   Vaping Use: Never used  Substance Use Topics   Alcohol use: Not Currently   Drug use: Never    Home Medications Prior to Admission medications   Medication Sig Start Date End Date Taking? Authorizing Provider  acetaminophen (TYLENOL) 325 MG tablet Take 650 mg by mouth every 6 (six) hours as needed for fever.   Yes [provider]  amiodarone (PACERONE) 200 MG tablet Take 200 mg by mouth daily. 04/02/21  Yes [provider]  amLODipine (NORVASC) 10 MG tablet Take 1 tablet (10 mg total) by mouth daily. 05/05/21  Yes Shelly Coss, MD  atorvastatin (LIPITOR) 20 MG tablet Take 20 mg by mouth every evening. 11/04/20  Yes [provider]  clopidogrel (PLAVIX) 75 MG tablet Take 75 mg by mouth daily. 12/17/20  Yes [provider]  ELIQUIS 5 MG TABS tablet Take 5 mg by mouth 2 (two) times daily. 11/05/20  Yes [provider]  glipiZIDE (GLUCOTROL) 10 MG tablet Take 10 mg by mouth 2 (two) times daily. 10/03/20  Yes [provider]  isosorbide mononitrate (IMDUR) 30 MG 24 hr tablet Take 30 mg by mouth daily. 09/08/20  Yes [provider]  LANTUS SOLOSTAR 100 UNIT/ML Solostar Pen Inject 34 Units into the skin every evening. 11/05/20  Yes [provider]  metFORMIN (GLUCOPHAGE) 1000 MG tablet Take 1,000 mg by mouth in the morning and at bedtime.   Yes [provider]  metoprolol tartrate (LOPRESSOR) 50 MG tablet Take 1 tablet (50 mg total) by mouth 2 (two) times daily. 05/05/21 06/04/21 Yes Shelly Coss, MD  nitroGLYCERIN (NITROSTAT) 0.3 MG SL tablet Place 0.3 mg under the tongue every 5 (five) minutes x 3 doses as needed for chest pain. 09/08/20  Yes [provider]  terazosin (HYTRIN) 2 MG capsule Take 2 mg by mouth at bedtime. 10/03/20  Yes [provider]  hydrochlorothiazide  (HYDRODIURIL) 25 MG tablet Take 25 mg by mouth daily.    [provider]  lisinopril (ZESTRIL) 20 MG tablet Take 20 mg by mouth daily.    [provider]    Allergies    Patient has no known allergies.  Review of Systems   Review of Systems  Constitutional:  Positive for activity change, chills, diaphoresis and fever.  Respiratory:  Negative for shortness of breath.   Cardiovascular:  Negative for chest pain.  Gastrointestinal:  Negative for abdominal pain, nausea and vomiting.  Genitourinary:  Positive for dysuria and frequency.  All other systems reviewed and are negative.  Physical Exam Updated Vital Signs BP 122/67   Pulse 63   Temp 98.2 F (36.8 C) (Oral)   Resp 18   SpO2 96%   Physical Exam Vitals and nursing note reviewed.  Constitutional:      Appearance: He is well-developed.  HENT:     Head: Atraumatic.  Cardiovascular:     Rate and Rhythm: Normal  rate.  Pulmonary:     Effort: Pulmonary effort is normal.  Musculoskeletal:     Cervical back: Neck supple.  Skin:    General: Skin is warm.  Neurological:     Mental Status: He is alert and oriented to person, place, and time.    ED Results / Procedures / Treatments   Labs (all labs ordered are listed, but only abnormal results are displayed) Labs Reviewed  CULTURE, BLOOD (ROUTINE X 2) - Abnormal; Notable for the following components:      Result Value   Culture   (*)    Value: PSEUDOMONAS AERUGINOSA SUSCEPTIBILITIES TO FOLLOW Performed at [facility], [address] [place], [place] [address]    All other components within normal limits  URINE CULTURE - Abnormal; Notable for the following components:   Culture 70,000 COLONIES/mL PSEUDOMONAS AERUGINOSA (*)    All other components within normal limits  BLOOD CULTURE ID PANEL (REFLEXED) - BCID2 - Abnormal; Notable for the following components:   Pseudomonas aeruginosa DETECTED (*)    All other components within normal limits   COMPREHENSIVE METABOLIC PANEL - Abnormal; Notable for the following components:   Potassium 3.4 (*)    Glucose, Bld 178 (*)    Creatinine, Ser 1.84 (*)    Calcium 8.3 (*)    Total Protein 5.3 (*)    Albumin 3.0 (*)    GFR, Estimated 38 (*)    All other components within normal limits  CBC WITH DIFFERENTIAL/PLATELET - Abnormal; Notable for the following components:   RBC 3.56 (*)    Hemoglobin 9.2 (*)    HCT 28.5 (*)    MCH 25.8 (*)    Neutro Abs 1.5 (*)    Monocytes Absolute 1.4 (*)    All other components within normal limits  PROTIME-INR - Abnormal; Notable for the following components:   Prothrombin Time 23.0 (*)    INR 2.0 (*)    All other components within normal limits  URINALYSIS, ROUTINE W REFLEX MICROSCOPIC - Abnormal; Notable for the following components:   APPearance HAZY (*)    Glucose, UA 50 (*)    Hgb urine dipstick LARGE (*)    Protein, ur 30 (*)    Leukocytes,Ua TRACE (*)    RBC / HPF >50 (*)    Bacteria, UA RARE (*)    All other components within normal limits  CBC - Abnormal; Notable for the following components:   WBC 3.6 (*)    RBC 3.63 (*)    Hemoglobin 9.3 (*)    HCT 29.2 (*)    MCH 25.6 (*)    All other components within normal limits  BASIC METABOLIC PANEL - Abnormal; Notable for the following components:   Potassium 3.4 (*)    Glucose, Bld 192 (*)    Creatinine, Ser 1.84 (*)    Calcium 8.4 (*)    GFR, Estimated 38 (*)    All other components within normal limits  GLUCOSE, CAPILLARY - Abnormal; Notable for the following components:   Glucose-Capillary 163 (*)    All other components within normal limits  GLUCOSE, CAPILLARY - Abnormal; Notable for the following components:   Glucose-Capillary 278 (*)    All other components within normal limits  CBC - Abnormal; Notable for the following components:   WBC 2.9 (*)    RBC 3.57 (*)    Hemoglobin 9.2 (*)    HCT 29.0 (*)    MCH 25.8 (*)    Platelets  147 (*)    All other components within  normal limits  BASIC METABOLIC PANEL - Abnormal; Notable for the following components:   Potassium 3.2 (*)    Glucose, Bld 157 (*)    BUN 26 (*)    Creatinine, Ser 1.80 (*)    Calcium 8.4 (*)    GFR, Estimated 39 (*)    All other components within normal limits  MAGNESIUM - Abnormal; Notable for the following components:   Magnesium 1.6 (*)    All other components within normal limits  GLUCOSE, CAPILLARY - Abnormal; Notable for the following components:   Glucose-Capillary 217 (*)    All other components within normal limits  GLUCOSE, CAPILLARY - Abnormal; Notable for the following components:   Glucose-Capillary 174 (*)    All other components within normal limits  GLUCOSE, CAPILLARY - Abnormal; Notable for the following components:   Glucose-Capillary 164 (*)    All other components within normal limits  GLUCOSE, CAPILLARY - Abnormal; Notable for the following components:   Glucose-Capillary 205 (*)    All other components within normal limits  RESP PANEL BY RT-PCR (FLU A&B, COVID) [id number]  CULTURE, BLOOD (ROUTINE X 2)  LACTIC ACID, PLASMA  LACTIC ACID, PLASMA  APTT  PROCALCITONIN  PHOSPHORUS    EKG EKG Interpretation  Date/Time:  Wednesday May 06 2021 17:50:02 EDT Ventricular Rate:  64 PR Interval:  183 QRS Duration: 104 QT Interval:  496 QTC Calculation: 512 R Axis:   -6 Text Interpretation: Sinus rhythm Prolonged QT interval No acute changes No significant change since last tracing Confirmed by Varney Biles [phone]) on 05/06/2021 6:21:07 PM  Radiology US RENAL  Result Date: 05/06/2021 CLINICAL DATA:  Acute on chronic kidney disease EXAM: RENAL / URINARY TRACT ULTRASOUND COMPLETE COMPARISON:  CT 04/30/2021 FINDINGS: Right Kidney: Renal measurements: 11.8 x 7.5 x 5.6 cm = volume: 259.6 mL. Echogenicity within normal limits. Mild right hydronephrosis which is likely improved as compared with recent CT. 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  Left Kidney: Renal measurements: 12.3 x 6 x 5.4 cm = volume: 7.7 mL. Small cyst at the midpole measuring 12 x 14 x 11 mm additional small cyst at the midpole measuring 12 x 9 x 15 mm. Bladder: Appears normal for degree of bladder distention. Other: None. IMPRESSION: 1. Mild right hydronephrosis probably improved as compared with prior CT. Right UPJ region mass on CT is not well demonstrated on sonogram today. 2. Small left renal cysts.  No left hydronephrosis Electronically Signed   By: Donavan Foil M.D.   On: 05/06/2021 21:28   DG Chest Port 1 View  Result Date: 05/06/2021 CLINICAL DATA:  75 year old male with concern for sepsis. EXAM: PORTABLE CHEST 1 VIEW COMPARISON:  None. FINDINGS: No focal consolidation, pleural effusion, or pneumothorax. Borderline cardiomegaly. Atherosclerotic calcification of the aorta. Bilateral hilar prominence, likely dilatation of the main pulmonary arteries and suggestive of pulmonary hypertension. Adenopathy is less likely but not excluded. Attention on follow-up imaging recommended. IMPRESSION: 1. No acute cardiopulmonary process. 2. Borderline cardiomegaly with findings suggestive of pulmonary hypertension. Electronically Signed   By: Anner Crete M.D.   On: 05/06/2021 18:27    Procedures .Critical Care  Date/Time: 05/08/2021 4:05 PM Performed by: Varney Biles, MD Authorized by: Varney Biles, MD   Critical care provider statement:    Critical care time (minutes):  52   Critical care was necessary to treat or prevent imminent or life-threatening deterioration of the following conditions:  Sepsis   Critical care was time spent personally by me on the following activities:  Discussions with consultants, evaluation of patient's response to treatment, examination of patient, ordering and performing treatments and interventions, ordering and review of laboratory studies, ordering and review of radiographic studies, pulse oximetry, re-evaluation of patient's  condition, obtaining history from patient or surrogate and review of old charts   Medications Ordered in ED Medications  sodium chloride flush (NS) 0.9 % injection 3 mL (3 mLs Intravenous Given 05/08/21 1006)  acetaminophen (TYLENOL) tablet 650 mg (has no administration in time range)    Or  acetaminophen (TYLENOL) suppository 650 mg (has no administration in time range)  senna-docusate (Senokot-S) tablet 1 tablet (has no administration in time range)  ondansetron (ZOFRAN) tablet 4 mg (has no administration in time range)    Or  ondansetron (ZOFRAN) injection 4 mg (has no administration in time range)  lactated ringers infusion ( Intravenous New Bag/Given 05/06/21 2047)  amiodarone (PACERONE) tablet 200 mg (200 mg Oral Given 05/08/21 1000)  amLODipine (NORVASC) tablet 10 mg (10 mg Oral Given 05/08/21 1000)  atorvastatin (LIPITOR) tablet 20 mg (20 mg Oral Given 05/07/21 1803)  clopidogrel (PLAVIX) tablet 75 mg (75 mg Oral Given 05/08/21 1000)  apixaban (ELIQUIS) tablet 5 mg (5 mg Oral Given 05/08/21 1000)  isosorbide mononitrate (IMDUR) 24 hr tablet 30 mg (30 mg Oral Given 05/08/21 1000)  metoprolol tartrate (LOPRESSOR) tablet 50 mg (50 mg Oral Given 05/08/21 1000)  terazosin (HYTRIN) capsule 2 mg (2 mg Oral Given 05/07/21 2124)  insulin glargine (LANTUS) injection 25 Units (25 Units Subcutaneous Given 05/07/21 2125)  insulin aspart (novoLOG) injection 0-9 Units (3 Units Subcutaneous Given 05/08/21 1300)  ceFEPIme (MAXIPIME) 2 g in sodium chloride 0.9 % 100 mL IVPB (2 g Intravenous New Bag/Given 05/08/21 0539)  ceFEPIme (MAXIPIME) 2 g in sodium chloride 0.9 % 100 mL IVPB (0 g Intravenous Stopped 05/06/21 1829)  acetaminophen (TYLENOL) tablet 650 mg (650 mg Oral Given 05/06/21 2044)  potassium chloride (KLOR-CON) packet 40 mEq (40 mEq Oral Given 05/06/21 2316)  phenazopyridine (PYRIDIUM) tablet 100 mg (100 mg Oral Given 05/07/21 2124)  magnesium sulfate IVPB 2 g 50 mL (2 g Intravenous New Bag/Given 05/08/21  1005)  potassium chloride (KLOR-CON) packet 40 mEq (40 mEq Oral Given 05/08/21 1000)    ED Course  I have reviewed the triage vital signs and the nursing notes.  Pertinent labs & imaging results that were available during my care of the patient were reviewed by me and considered in my medical decision making (see chart for details).    MDM Rules/Calculators/A&P                          75 year old male with history of diabetes comes in with chief complaint of fevers, chills, sweats along with urinary frequency and burning with urination.  He had recent ureteral stent and cystoscopy done.  Also had Foley catheter placed for long duration.  At this time he is afebrile, but EMS reported that patient was tachypneic and uncomfortable appearing when they first arrived.  Family reports temperature of  39 C.  Clinical suspicion for UTI.   Questionable sepsis and bacteremia given the rigors and sweats. Appropriate labs ordered to evaluate for any organ dysfunction.   The 2 SIRS criteria likely are fever and elevated respiratory rate, as EMS had put patient on oxygen because of his tachypnea with respiratory rate in the 30s.  Final Clinical Impression(s) / ED Diagnoses Final diagnoses:  Lower urinary tract infectious disease    Rx / DC Orders ED Discharge Orders     None        Varney Biles, MD 05/08/21 1606

## 2021-05-06 NOTE — H&P (Signed)
History and Physical    [name] CBJ:[id number] DOB: 1946/02/03 DOA: 05/06/2021  PCP: [name], MD  Patient coming from: Home  I have personally briefly reviewed patient's old medical records in [place]  Chief Complaint: Fevers, dysuria  HPI: [name] is a Russian-speaking 75 y.o. male with medical history significant for CAD s/p PCI with DES to RCA 11/2020, PAF on Eliquis and amiodarone, NSVT, CKD stage IIIa, IDT2DM, HTN, HLD, OSA on CPAP, and recent admission for right hydronephrosis due to obstructing mass who presents to the ED for evaluation of fevers and dysuria.  Video interpretive services were used to assist with communication.  Patient recently admitted 04/30/2021-05/05/2021 for AKI on CKD secondary to right hydronephrosis from obstructing mass.  He underwent right ureteral stent placement on 05/01/2021 by urology, Dr. Junious Silk.  Foley catheter was placed postoperatively and removed on day of discharge.  He was also found to have pathologic abdominal lymphadenopathy and pelvic masses.  He underwent IR guided biopsy of the right infrarenal retroperitoneal mass on 6/20.  Pathology results pending and he was scheduled to follow with urology and oncology as an outpatient.  Patient states that he was urinating appropriately at time of discharge from the hospital.  Today at home, however, he said he had to strain to urinate for nearly 30 minutes.  Afterwards he had increased urinary frequency with dysuria and suprapubic discomfort.  He has not had any hematuria.  He then developed fevers, chills, and rigors.  He has not had any chest pain or dyspnea.  He took 1 g of Tylenol at home.  EMS were called and per ED triage notes temperature was 100.8 F with RR 28 further vitals.  He was given 500 mL normal saline on route to the ED.  ED Course:  Initial vitals showed BP 126/82, pulse 71, RR 18, temp 97.9 F.  While in the ED patient had intermittent tachypnea with  RR up to 27.  SPO2 96% on room air.  Labs show WBC 4.4, hemoglobin 9.2, platelets 176,000, sodium 137, potassium 3.4, bicarb 25, BUN 23, creatinine 1.84 (1.57 on 6/21), serum glucose 178, lactic acid 1.1.  Urinalysis shows negative nitrates, trace leukocytes, >50 RBC/hpf, 11-20 WBC/hpf, rare bacteria microscopy.  Urine and blood cultures obtained and pending.  SARS-CoV-2 PCR negative.  Influenza A/B PCR's are negative.  Formal chest x-ray is negative for focal consolidation, edema, or effusion.  Borderline cardiomegaly noted.  Patient was given IV cefepime and the hospitalist service was consulted to admit for further evaluation and management.  Review of Systems: All systems reviewed and are negative except as documented in history of present illness above.   Past Medical History:  Diagnosis Date   Anginal pain ([place])    Coronary artery disease    Diabetes mellitus without complication ([place])    Dysrhythmia    Hypertension    Sleep apnea     Past Surgical History:  Procedure Laterality Date   CARDIAC CATHETERIZATION     EYE SURGERY     INJECTION OF SILICONE OIL Left 16/05/3709   Procedure: INJECTION OF SILICONE OIL;  Surgeon: Sherlynn Stalls, MD;  Location: [place];  Service: Ophthalmology;  Laterality: Left;   INJECTION OF SILICONE OIL Left 05/10/9484   Procedure: INJECTION OF SILICONE OIL;  Surgeon: Sherlynn Stalls, MD;  Location: [place];  Service: Ophthalmology;  Laterality: Left;   JOINT REPLACEMENT Right    hip   LASER PHOTO ABLATION Left 12/25/2020   Procedure:  LASER PHOTO ABLATION;  Surgeon: Sherlynn Stalls, MD;  Location: [place];  Service: Ophthalmology;  Laterality: Left;   PARS PLANA VITRECTOMY Left 11/10/2020   Procedure: PARS PLANA VITRECTOMY WITH 25 GAUGE;  Surgeon: Sherlynn Stalls, MD;  Location: [place];  Service: Ophthalmology;  Laterality: Left;   PARS PLANA VITRECTOMY Left 12/25/2020   Procedure: PARS PLANA VITRECTOMY WITH 25 GAUGE IN LEFT EYE;  Surgeon: Sherlynn Stalls, MD;   Location: [place];  Service: Ophthalmology;  Laterality: Left;   PERFLUORONE INJECTION Left 12/25/2020   Procedure: PERFLUORONE INJECTION;  Surgeon: Sherlynn Stalls, MD;  Location: [place];  Service: Ophthalmology;  Laterality: Left;   PHOTOCOAGULATION WITH LASER Left 11/10/2020   Procedure: PHOTOCOAGULATION WITH LASER;  Surgeon: Sherlynn Stalls, MD;  Location: [place];  Service: Ophthalmology;  Laterality: Left;   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 He has never used smokeless tobacco. He reports previous alcohol use. He reports that he does not use drugs.  No Known Allergies  Family History  Problem Relation Age of Onset   CAD Other      Prior to Admission medications   Medication Sig Start Date End Date Taking? Authorizing Provider  amiodarone (PACERONE) 200 MG tablet Take 200 mg by mouth daily. 04/02/21   [provider]  amLODipine (NORVASC) 10 MG tablet Take 1 tablet (10 mg total) by mouth daily. 05/05/21   Shelly Coss, MD  atorvastatin (LIPITOR) 20 MG tablet Take 20 mg by mouth every evening. 11/04/20   [provider]  clopidogrel (PLAVIX) 75 MG tablet Take 75 mg by mouth daily. 12/17/20   [provider]  ELIQUIS 5 MG TABS tablet Take 5 mg by mouth 2 (two) times daily. 11/05/20   [provider]  glipiZIDE (GLUCOTROL) 10 MG tablet Take 10 mg by mouth 2 (two) times daily. 10/03/20   [provider]  isosorbide mononitrate (IMDUR) 30 MG 24 hr tablet Take 30 mg by mouth daily. 09/08/20   [provider]  LANTUS SOLOSTAR 100 UNIT/ML Solostar Pen Inject 34 Units into the  skin every evening. 11/05/20   [provider]  metFORMIN (GLUCOPHAGE) 1000 MG tablet Take 1,000 mg by mouth in the morning and at bedtime.    [provider]  metoprolol tartrate (LOPRESSOR) 50 MG tablet Take 1 tablet (50 mg total) by mouth 2 (two) times daily. 05/05/21 06/04/21  Shelly Coss, MD  nitroGLYCERIN (NITROSTAT) 0.3 MG SL tablet Place 0.3 mg under the tongue every 5 (five) minutes x 3 doses as needed for chest pain. 09/08/20   [provider]  prednisoLONE acetate (PRED FORTE) 1 % ophthalmic suspension Place 1 drop into the left eye 2 (two) times daily. 11/25/20   [provider]  terazosin (HYTRIN) 2 MG capsule Take 2 mg by mouth at bedtime. 10/03/20   [provider]    Physical Exam: Vitals:   05/06/21 1730 05/06/21 1800 05/06/21 1829 05/06/21 1930  BP: 131/70 127/61 127/61 (!) 154/74  Pulse: 65 65 65 79  Resp: 17 20 20  (!) 24  Temp:      TempSrc:      SpO2:  97% 97% 97% 97%   Constitutional: Resting supine in bed with head slightly elevated, NAD, calm, comfortable Eyes: PERRL, lids and conjunctivae normal ENMT: Mucous membranes are moist. Posterior pharynx clear of any exudate or lesions.Normal dentition.  Neck: normal, supple, no masses. Respiratory: clear to auscultation bilaterally, no wheezing, no crackles. Normal respiratory effort. No accessory muscle use.  Cardiovascular: Regular rate and rhythm, 2/6 systolic murmur present. No extremity edema. 2+ pedal pulses. Abdomen: Suprapubic tenderness.  No hepatosplenomegaly. Bowel sounds positive.  Musculoskeletal: no clubbing / cyanosis. No joint deformity upper and lower extremities. Good ROM, no contractures. Normal muscle tone.  GU: External catheter in place with light brown urinary output in collecting canister. Skin: no rashes, lesions, ulcers. No induration Neurologic: CN 2-12 grossly intact. Sensation intact. Strength 5/5 in all 4.  Psychiatric: Normal judgment and  insight. Alert and oriented x 3. Normal mood.   Labs on Admission: I have personally reviewed following labs and imaging studies  CBC: Recent Labs  Lab 05/02/21 0629 05/03/21 0638 05/04/21 0424 05/05/21 0556 05/06/21 1747  WBC 4.9 3.2* 3.0* 2.7* 4.4  NEUTROABS 2.7  --   --   --  1.5*  HGB 11.1* 9.7* 10.0* 10.6* 9.2*  HCT 34.7* 30.8* 31.5* 33.8* 28.5*  MCV 81.3 82.4 82.0 83.0 80.1  PLT 189 165 181 198 790   Basic Metabolic Panel: Recent Labs  Lab 05/02/21 0629 05/03/21 0906 05/04/21 0424 05/05/21 0556 05/06/21 1747  NA 138 139 142 142 137  K 4.5 3.8 3.9 3.7 3.4*  CL 106 108 111 108 106  CO2 26 26 24 26 25   GLUCOSE 228* 200* 192* 182* 178*  BUN 37* 38* 27* 23 23  CREATININE 2.58* 2.22* 1.91* 1.57* 1.84*  CALCIUM 9.0 8.2* 8.2* 8.7* 8.3*   GFR: Estimated Creatinine Clearance: 45.1 mL/min (A) (by C-G formula based on SCr of 1.84 mg/dL (H)). Liver Function Tests: Recent Labs  Lab 04/30/21 1252 05/06/21 1747  AST 10* 18  ALT 12 17  ALKPHOS 64 55  BILITOT 0.6 0.7  PROT 5.9* 5.3*  ALBUMIN 3.7 3.0*   Recent Labs  Lab 04/30/21 1252  LIPASE 13   No results for input(s): AMMONIA in the last 168 hours. Coagulation Profile: Recent Labs  Lab 05/02/21 0629 05/06/21 1747  INR 1.5* 2.0*   Cardiac Enzymes: No results for input(s): CKTOTAL, CKMB, CKMBINDEX, TROPONINI in the last 168 hours. BNP (last 3 results) No results for input(s): PROBNP in the last 8760 hours. HbA1C: No results for input(s): HGBA1C in the last 72 hours. CBG: Recent Labs  Lab 05/04/21 2359 05/05/21 0453 05/05/21 0717 05/05/21 1142 05/05/21 1326  GLUCAP 152* 173* 165* 266* 265*   Lipid Profile: No results for input(s): CHOL, HDL, LDLCALC, TRIG, CHOLHDL, LDLDIRECT in the last 72 hours. Thyroid Function Tests: No results for input(s): TSH, T4TOTAL, FREET4, T3FREE, THYROIDAB in the last 72 hours. Anemia Panel: No results for input(s): VITAMINB12, FOLATE, FERRITIN, TIBC, IRON, RETICCTPCT  in the last 72 hours. Urine analysis:    Component Value Date/Time   COLORURINE YELLOW 05/06/2021 1824   APPEARANCEUR HAZY (A) 05/06/2021 1824   LABSPEC 1.008 05/06/2021 1824   PHURINE 5.0 05/06/2021 1824   GLUCOSEU 50 (A) 05/06/2021 1824   HGBUR LARGE (A) 05/06/2021 1824   BILIRUBINUR NEGATIVE 05/06/2021 1824   KETONESUR NEGATIVE 05/06/2021 1824   PROTEINUR 30 (A) 05/06/2021 1824   NITRITE NEGATIVE 05/06/2021 1824   LEUKOCYTESUR TRACE (A) 05/06/2021 1824    Radiological Exams on Admission:  DG Chest Port 1 View  Result Date: 05/06/2021 CLINICAL DATA:  75 year old male with concern for sepsis. EXAM: PORTABLE CHEST 1 VIEW COMPARISON:  None. FINDINGS: No focal consolidation, pleural effusion, or pneumothorax. Borderline cardiomegaly. Atherosclerotic calcification of the aorta. Bilateral hilar prominence, likely dilatation of the main pulmonary arteries and suggestive of pulmonary hypertension. Adenopathy is less likely but not excluded. Attention on follow-up imaging recommended. IMPRESSION: 1. No acute cardiopulmonary process. 2. Borderline cardiomegaly with findings suggestive of pulmonary hypertension. Electronically Signed   By: Anner Crete M.D.   On: 05/06/2021 18:27    EKG: Personally reviewed. Sinus rhythm, QTC 512, no acute ischemic changes.  QTC is longer when compared to prior.  Assessment/Plan Principal Problem:   Complicated UTI (urinary tract infection) Active Problems:   Acute kidney injury superimposed on CKD (H[facility])   CAD (coronary artery disease)   DM2 (diabetes mellitus, type 2) ([facility])   PAF (paroxysmal atrial fibrillation) ([place])   Hypertension associated with diabetes ([place])   Sleep apnea   Hyperlipidemia associated with type 2 diabetes mellitus ([place])   [name] is a Russian-speaking 75 y.o. male with medical history significant for CAD s/p PCI with DES to RCA 11/2020, PAF on Eliquis and amiodarone, NSVT, CKD stage IIIa, IDT2DM, HTN, HLD, OSA on CPAP, and  recent admission for right hydronephrosis due to obstructing mass s/p right ureteral stent placement on 6/17 who is admitted with complicated UTI.  Complicated UTI Right hydronephrosis due to obstructing mass s/p right ureteral stent placement 05/01/21: Presenting with urinary symptoms, fever at home, and rigors after recent hospitalization with ureteral stenting and temporary Foley catheter placement concerning for complicated UTI. -Continue empiric cefepime -Follow blood and urine cultures -Obtain renal ultrasound -Continue IV fluid hydration overnight -Monitor strict I/O's  AKI on CKD stage IIIa: -Continue IV fluids and check renal ultrasound as above  Pelvic masses/abdominal lymphadenopathy: Pathologic mesenteric, retroperitoneal, and pelvic adenopathy seen on CT A/P 04/30/2021.  S/p IR guided biopsy of right infrarenal retroperitoneal mass, pathology results pending. -pathology results pending, follow-up with oncology  Paroxysmal atrial fibrillation: In sinus rhythm on admission.  Continue home Lopressor, amiodarone, and Eliquis.  CAD s/p PCI with DES to RCA 11/2020: Denies any chest pain.  Continue Lopressor, Imdur, Eliquis, Plavix, atorvastatin.  Insulin-dependent type 2 diabetes: Continue reduced home Lantus 25 units nightly, SSI.  Hold home glipizide and metformin.  Hypertension: Continue amlodipine, Lopressor, Imdur.  OSA: Continue CPAP nightly.   DVT prophylaxis: Eliquis Code Status: Full code, confirmed with patient on admission Family Communication: Discussed with patient spouse at bedside Disposition Plan: From home and likely discharge to home pending clinical progress Consults called: None Level of care: Med-Surg Admission status:  Status is: Observation  The patient remains OBS appropriate and will d/c before 2 midnights.  Dispo: The patient is from: Home              Anticipated d/c is to: Home              Patient currently is not medically stable to  d/c.   Zada Finders MD [facility]  If 7PM-7AM, please contact night-coverage [email or web address]  05/06/2021, 7:49 PM

## 2021-05-07 DIAGNOSIS — N390 Urinary tract infection, site not specified: Secondary | ICD-10-CM

## 2021-05-07 LAB — BLOOD CULTURE ID PANEL (REFLEXED) - BCID2

## 2021-05-07 LAB — BASIC METABOLIC PANEL
Anion gap: 9 (ref 5–15)
BUN: 23 mg/dL (ref 8–23)
CO2: 26 mmol/L (ref 22–32)
Calcium: 8.4 mg/dL — ABNORMAL LOW (ref 8.9–10.3)
Chloride: 105 mmol/L (ref 98–111)
Creatinine, Ser: 1.84 mg/dL — ABNORMAL HIGH (ref 0.61–1.24)
GFR, Estimated: 38 mL/min — ABNORMAL LOW (ref >60)
Glucose, Bld: 192 mg/dL — ABNORMAL HIGH (ref 70–99)
Potassium: 3.4 mmol/L — ABNORMAL LOW (ref 3.5–5.1)
Sodium: 140 mmol/L (ref 135–145)

## 2021-05-07 LAB — CBC
HCT: 29.2 % — ABNORMAL LOW (ref 39.0–52.0)
Hemoglobin: 9.3 g/dL — ABNORMAL LOW (ref 13.0–17.0)
MCH: 25.6 pg — ABNORMAL LOW (ref 26.0–34.0)
MCHC: 31.8 g/dL (ref 30.0–36.0)
MCV: 80.4 fL (ref 80.0–100.0)
Platelets: 166 10*3/uL (ref 150–400)
RBC: 3.63 MIL/uL — ABNORMAL LOW (ref 4.22–5.81)
RDW: 15.1 % (ref 11.5–15.5)
WBC: 3.6 10*3/uL — ABNORMAL LOW (ref 4.0–10.5)
nRBC: 0 % (ref 0.0–0.2)

## 2021-05-07 LAB — GLUCOSE, CAPILLARY
Glucose-Capillary: 278 mg/dL — ABNORMAL HIGH (ref 70–99)
Glucose-Capillary: 217 mg/dL — ABNORMAL HIGH (ref 70–99)
Glucose-Capillary: 174 mg/dL — ABNORMAL HIGH (ref 70–99)

## 2021-05-07 LAB — CULTURE, BLOOD (ROUTINE X 2)
Culture: NO GROWTH
Culture: NO GROWTH
Special Requests: ADEQUATE
Special Requests: ADEQUATE

## 2021-05-07 LAB — SURGICAL PATHOLOGY

## 2021-05-07 LAB — PROCALCITONIN: Procalcitonin: 0.5 ng/mL

## 2021-05-07 MED ORDER — PHENAZOPYRIDINE HCL 100 MG PO TABS
100.0000 mg | ORAL_TABLET | Freq: Once | ORAL | Status: AC
  Administered 2021-05-07: 100 mg via ORAL
  Filled 2021-05-07: qty 1

## 2021-05-07 NOTE — Progress Notes (Signed)
Patient declines the use of nocturnal CPAP tonight.  ?

## 2021-05-07 NOTE — Plan of Care (Signed)
  Problem: Activity: Goal: Risk for activity intolerance will decrease Outcome: Progressing   Problem: Nutrition: Goal: Adequate nutrition will be maintained Outcome: Progressing   Problem: Coping: Goal: Level of anxiety will decrease Outcome: Progressing   

## 2021-05-07 NOTE — Progress Notes (Signed)
PROGRESS NOTE    [name]  PZW:[id number] DOB: 03/15/1946 DOA: 05/06/2021 PCP: [name], MD    Brief Narrative:  This 75 years old male with PMH significant for CAD s/p PCI with DES s/p PCI with DES to RCA 11/2020, PAF on Eliquis and amiodarone, NSVT, CKD stage IIIa, IDT2DM, HTN, HLD, OSA on CPAP, and recent admission for right hydronephrosis due to obstructing mass who presents to the ED for evaluation of fevers and dysuria.  Patient recently admitted 04/30/2021-05/05/2021 for AKI on CKD secondary to right hydronephrosis from obstructing mass.  He underwent right ureteral stent placement on 05/01/2021 by urology, Dr. Junious Silk.  Foley catheter was placed postoperatively and removed on day of discharge.  He was also found to have pathologic abdominal lymphadenopathy and pelvic masses.  He underwent IR guided biopsy of the right infrarenal retroperitoneal mass on 6/20.  Pathology results pending and he was scheduled to follow with urology and oncology as an outpatient. Patient presented with increased urinary frequency, dysuria and suprapubic discomfort and straining to urinate,  denies any hematuria.  He is found to have fever.  UA consistent with UTI. He was admitted for complicated UTI,  started on empiric IV antibiotics.  Assessment & Plan:   Principal Problem:   Complicated UTI (urinary tract infection) Active Problems:   Acute kidney injury superimposed on CKD (HCC)   CAD (coronary artery disease)   DM2 (diabetes mellitus, type 2) (HCC)   PAF (paroxysmal atrial fibrillation) ([place])   Hypertension associated with diabetes ([place])   Sleep apnea   Hyperlipidemia associated with type 2 diabetes mellitus ([place])  Complicated UTI : Patient presented with urinary symptoms, increased urinary frequency, straining while urination, dysuria and fever at home. He recently hospitalized and underwent ureteral stenting and temporary Foley catheter placement. Right hydronephrosis due to  obstructing mass s/p right ureteral stent placement 05/01/21: -Continue empiric cefepime -Follow blood and urine cultures Renal ultrasound shows improving hydronephrosis. -Continue IV fluid hydration overnight -Monitor strict I/O's   AKI on CKD stage IIIa: Continue IV fluids , avoid nephrotoxic medications. Continue to monitor serum creatinine.   Pelvic masses/abdominal lymphadenopathy: Pathologic mesenteric, retroperitoneal, and pelvic adenopathy seen on CT A/P 04/30/2021.  S/p IR guided biopsy of right infrarenal retroperitoneal mass. -pathology results pending, follow-up with oncology   Paroxysmal atrial fibrillation: Remains in sinus rhythm .  Continue home Lopressor, amiodarone, and Eliquis.   CAD s/p PCI with DES to RCA 11/2020: Denies any chest pain.  Continue Lopressor, Imdur, Eliquis, Plavix, atorvastatin.   Insulin-dependent type 2 diabetes: Continue reduced home Lantus 25 units nightly, SSI.   Hold home glipizide and metformin.   Hypertension: Continue amlodipine, Lopressor, Imdur.   OSA: Continue CPAP nightly.    DVT prophylaxis: Eliquis Code Status: Full code Family Communication: No family at bedside Disposition Plan:   Status is: Observation  The patient remains OBS appropriate and will d/c before 2 midnights.  Dispo: The patient is from: Home              Anticipated d/c is to: Home              Patient currently is not medically stable to d/c.   Difficult to place patient No  Consultants:  None  Procedures:  Renal US Antimicrobials:   Anti-infectives (From admission, onward)    Start     Dose/Rate Route Frequency Ordered Stop   05/07/21 0600  ceFEPIme (MAXIPIME) 2 g in sodium chloride 0.9 % 100 mL IVPB  2 g 200 mL/hr over 30 Minutes Intravenous Every 12 hours 05/06/21 2131     05/06/21 1730  ceFEPIme (MAXIPIME) 2 g in sodium chloride 0.9 % 100 mL IVPB        2 g 200 mL/hr over 30 Minutes Intravenous  Once 05/06/21 1729 05/06/21 1829        Subjective: Patient was seen and examined at bedside.  Overnight events noted.  Patient reports feeling much improved.  He denies any pain.  [place] interpreter Eleanora ID # [id number]  Objective: Vitals:   05/06/21 2257 05/07/21 0601 05/07/21 1013 05/07/21 1341  BP:  (!) 144/66 (!) 157/71 132/71  Pulse: 80 62 70 63  Resp: 20  20 20   Temp:  98.6 F (37 C) 98.5 F (36.9 C) 98.7 F (37.1 C)  TempSrc:  Oral Oral Oral  SpO2: 98% 98% 98% 98%    Intake/Output Summary (Last 24 hours) at 05/07/2021 1550 Last data filed at 05/07/2021 0457 Gross per 24 hour  Intake 420 ml  Output 850 ml  Net -430 ml   There were no vitals filed for this visit.  Examination:  General exam: Appears calm and comfortable , not in any acute distress. Respiratory system: Clear to auscultation. Respiratory effort normal. Cardiovascular system: S1 & S2 heard, RRR. No JVD, murmurs, rubs, gallops or clicks. No pedal edema. Gastrointestinal system: Abdomen is nondistended, soft and nontender. No organomegaly or masses felt. Normal bowel sounds heard. Central nervous system: Alert and oriented. No focal neurological deficits. Extremities: Symmetric 5 x 5 power.  No edema, no cyanosis, no clubbing. Skin: No rashes, lesions or ulcers Psychiatry: Judgement and insight appear normal. Mood & affect appropriate.     Data Reviewed: I have personally reviewed following labs and imaging studies  CBC: Recent Labs  Lab 05/02/21 0629 05/03/21 2637 05/04/21 0424 05/05/21 0556 05/06/21 1747 05/07/21 0624  WBC 4.9 3.2* 3.0* 2.7* 4.4 3.6*  NEUTROABS 2.7  --   --   --  1.5*  --   HGB 11.1* 9.7* 10.0* 10.6* 9.2* 9.3*  HCT 34.7* 30.8* 31.5* 33.8* 28.5* 29.2*  MCV 81.3 82.4 82.0 83.0 80.1 80.4  PLT 189 165 181 198 176 858   Basic Metabolic Panel: Recent Labs  Lab 05/03/21 0906 05/04/21 0424 05/05/21 0556 05/06/21 1747 05/07/21 0624  NA 139 142 142 137 140  K 3.8 3.9 3.7 3.4* 3.4*  CL 108 111 108 106 105   CO2 26 24 26 25 26   GLUCOSE 200* 192* 182* 178* 192*  BUN 38* 27* 23 23 23   CREATININE 2.22* 1.91* 1.57* 1.84* 1.84*  CALCIUM 8.2* 8.2* 8.7* 8.3* 8.4*   GFR: Estimated Creatinine Clearance: 45.1 mL/min (A) (by C-G formula based on SCr of 1.84 mg/dL (H)). Liver Function Tests: Recent Labs  Lab 05/06/21 1747  AST 18  ALT 17  ALKPHOS 55  BILITOT 0.7  PROT 5.3*  ALBUMIN 3.0*   No results for input(s): LIPASE, AMYLASE in the last 168 hours. No results for input(s): AMMONIA in the last 168 hours. Coagulation Profile: Recent Labs  Lab 05/02/21 0629 05/06/21 1747  INR 1.5* 2.0*   Cardiac Enzymes: No results for input(s): CKTOTAL, CKMB, CKMBINDEX, TROPONINI in the last 168 hours. BNP (last 3 results) No results for input(s): PROBNP in the last 8760 hours. HbA1C: No results for input(s): HGBA1C in the last 72 hours. CBG: Recent Labs  Lab 05/05/21 0717 05/05/21 1142 05/05/21 1326 05/06/21 2218 05/07/21 1201  GLUCAP 165* 266* 265* 163*  278*   Lipid Profile: No results for input(s): CHOL, HDL, LDLCALC, TRIG, CHOLHDL, LDLDIRECT in the last 72 hours. Thyroid Function Tests: No results for input(s): TSH, T4TOTAL, FREET4, T3FREE, THYROIDAB in the last 72 hours. Anemia Panel: No results for input(s): VITAMINB12, FOLATE, FERRITIN, TIBC, IRON, RETICCTPCT in the last 72 hours. Sepsis Labs: Recent Labs  Lab 05/06/21 1747 05/06/21 2204 05/07/21 0624  PROCALCITON  --   --  0.50  LATICACIDVEN 1.1 1.0  --     Recent Results (from the past 240 hour(s))  Resp Panel by RT-PCR (Flu A&B, Covid) Nasopharyngeal Swab     Status: None   Collection Time: 04/30/21  2:58 PM   Specimen: Nasopharyngeal Swab; Nasopharyngeal(NP) swabs in vial transport medium  Result Value Ref Range Status   SARS Coronavirus 2 by RT PCR NEGATIVE NEGATIVE Final    Comment: (NOTE) SARS-CoV-2 target nucleic acids are NOT DETECTED.  The SARS-CoV-2 RNA is generally detectable in upper respiratory specimens  during the acute phase of infection. The lowest concentration of SARS-CoV-2 viral copies this assay can detect is 138 copies/mL. A negative result does not preclude SARS-Cov-2 infection and should not be used as the sole basis for treatment or other patient management decisions. A negative result may occur with  improper specimen collection/handling, submission of specimen other than nasopharyngeal swab, presence of viral mutation(s) within the areas targeted by this assay, and inadequate number of viral copies(<138 copies/mL). A negative result must be combined with clinical observations, patient history, and epidemiological information. The expected result is Negative.  Fact Sheet for Patients:  EntrepreneurPulse.com.au  Fact Sheet for Healthcare Providers:  IncredibleEmployment.be  This test is no t yet approved or cleared by the [place] FDA and  has been authorized for detection and/or diagnosis of SARS-CoV-2 by FDA under an Emergency Use Authorization (EUA). This EUA will remain  in effect (meaning this test can be used) for the duration of the COVID-19 declaration under Section 564(b)(1) of the Act, 21 U.S.C.section 360bbb-3(b)(1), unless the authorization is terminated  or revoked sooner.       Influenza A by PCR NEGATIVE NEGATIVE Final   Influenza B by PCR NEGATIVE NEGATIVE Final    Comment: (NOTE) The Xpert Xpress SARS-CoV-2/FLU/RSV plus assay is intended as an aid in the diagnosis of influenza from Nasopharyngeal swab specimens and should not be used as a sole basis for treatment. Nasal washings and aspirates are unacceptable for Xpert Xpress SARS-CoV-2/FLU/RSV testing.  Fact Sheet for Patients: EntrepreneurPulse.com.au  Fact Sheet for Healthcare Providers: IncredibleEmployment.be  This test is not yet approved or cleared by the [place] FDA and has been authorized for detection  and/or diagnosis of SARS-CoV-2 by FDA under an Emergency Use Authorization (EUA). This EUA will remain in effect (meaning this test can be used) for the duration of the COVID-19 declaration under Section 564(b)(1) of the Act, 21 U.S.C. section 360bbb-3(b)(1), unless the authorization is terminated or revoked.  Performed at [facility], [address] [place], [place] [address]   Culture, blood (routine x 2)     Status: None   Collection Time: 05/02/21 10:00 AM   Specimen: BLOOD  Result Value Ref Range Status   Specimen Description   Final    BLOOD LEFT ANTECUBITAL Performed at [facility] [address], [place], [place] [address]    Special Requests   Final    BOTTLES DRAWN AEROBIC ONLY Blood Culture adequate volume Performed at [facility]  [address], [place], [place] [address]    Culture   Final    NO GROWTH 5 DAYS Performed at [facility], [place] [address], [place], [place] [address]    Report Status 05/07/2021 FINAL  Final  Culture, blood (routine x 2)     Status: None   Collection Time: 05/02/21 10:00 AM   Specimen: BLOOD  Result Value Ref Range Status   Specimen Description   Final    BLOOD BLOOD LEFT HAND Performed at [facility] [address], [place], [place] [address]    Special Requests   Final    BOTTLES DRAWN AEROBIC ONLY Blood Culture adequate volume Performed at [facility] [address] [place], [place] [address]    Culture   Final    NO GROWTH 5 DAYS Performed at [facility], [place] [address], [place], [place] [address]    Report Status 05/07/2021 FINAL  Final  Blood Culture (routine x 2)     Status: None (Preliminary result)   Collection Time: 05/06/21  5:48 PM   Specimen: BLOOD LEFT FOREARM  Result Value Ref Range Status   Specimen Description   Final    BLOOD LEFT FOREARM Performed at [facility]  [address], [place], [place] [address]    Special Requests   Final    BOTTLES DRAWN AEROBIC AND ANAEROBIC Blood Culture adequate volume Performed at [facility] [address], [place], [place] [address]    Culture   Final    NO GROWTH < 24 HOURS Performed at [facility] [address], [place], [place] [address]    Report Status PENDING  Incomplete  Resp Panel by RT-PCR (Flu A&B, Covid) Nasopharyngeal Swab     Status: None   Collection Time: 05/06/21  5:52 PM   Specimen: Nasopharyngeal Swab; Nasopharyngeal(NP) swabs in vial transport medium  Result Value Ref Range Status   SARS Coronavirus 2 by RT PCR NEGATIVE NEGATIVE Final    Comment: (NOTE) SARS-CoV-2 target nucleic acids are NOT DETECTED.  The SARS-CoV-2 RNA is generally detectable in upper respiratory specimens during the acute phase of infection. The lowest concentration of SARS-CoV-2 viral copies this assay can detect is 138 copies/mL. A negative result does not preclude SARS-Cov-2 infection and should not be used as the sole basis for treatment or other patient management decisions. A negative result may occur with  improper specimen collection/handling, submission of specimen other than nasopharyngeal swab, presence of viral mutation(s) within the areas targeted by this assay, and inadequate number of viral copies(<138 copies/mL). A negative result must be combined with clinical observations, patient history, and epidemiological information. The expected result is Negative.  Fact Sheet for Patients:  EntrepreneurPulse.com.au  Fact Sheet for Healthcare Providers:  IncredibleEmployment.be  This test is no t yet approved or cleared by the [place] FDA and  has been authorized for detection and/or diagnosis of SARS-CoV-2 by FDA under an Emergency Use Authorization (EUA). This EUA will remain  in effect (meaning this test can be used) for the duration of  the COVID-19 declaration under Section 564(b)(1) of the Act, 21 U.S.C.section 360bbb-3(b)(1), unless the authorization is terminated  or revoked sooner.       Influenza A by PCR NEGATIVE NEGATIVE Final   Influenza B by PCR NEGATIVE NEGATIVE Final    Comment: (NOTE) The Xpert Xpress SARS-CoV-2/FLU/RSV plus assay is intended as an aid in the diagnosis of influenza from  Nasopharyngeal swab specimens and should not be used as a sole basis for treatment. Nasal washings and aspirates are unacceptable for Xpert Xpress SARS-CoV-2/FLU/RSV testing.  Fact Sheet for Patients: EntrepreneurPulse.com.au  Fact Sheet for Healthcare Providers: IncredibleEmployment.be  This test is not yet approved or cleared by the [place] FDA and has been authorized for detection and/or diagnosis of SARS-CoV-2 by FDA under an Emergency Use Authorization (EUA). This EUA will remain in effect (meaning this test can be used) for the duration of the COVID-19 declaration under Section 564(b)(1) of the Act, 21 U.S.C. section 360bbb-3(b)(1), unless the authorization is terminated or revoked.  Performed at [facility], [place] [address], [place], [place] [address]   Blood Culture (routine x 2)     Status: None (Preliminary result)   Collection Time: 2021/06/02  6:02 PM   Specimen: BLOOD  Result Value Ref Range Status   Specimen Description   Final    BLOOD RIGHT ANTECUBITAL Performed at [facility] [address], [place], [place] [address]    Special Requests   Final    BOTTLES DRAWN AEROBIC AND ANAEROBIC Blood Culture adequate volume Performed at [facility] [address], [place], [place] [address]    Culture   Final    NO GROWTH < 24 HOURS Performed at [facility] [address], [place], [place] [address]    Report Status PENDING  Incomplete    Radiology Studies: US RENAL  Result Date:  06/02/2021 CLINICAL DATA:  Acute on chronic kidney disease EXAM: RENAL / URINARY TRACT ULTRASOUND COMPLETE COMPARISON:  CT 04/30/2021 FINDINGS: Right Kidney: Renal measurements: 11.8 x 7.5 x 5.6 cm = volume: 259.6 mL. Echogenicity within normal limits. Mild right hydronephrosis which is likely improved as compared with recent CT. 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 Left Kidney: Renal measurements: 12.3 x 6 x 5.4 cm = volume: 7.7 mL. Small cyst at the midpole measuring 12 x 14 x 11 mm additional small cyst at the midpole measuring 12 x 9 x 15 mm. Bladder: Appears normal for degree of bladder distention. Other: None. IMPRESSION: 1. Mild right hydronephrosis probably improved as compared with prior CT. Right UPJ region mass on CT is not well demonstrated on sonogram today. 2. Small left renal cysts.  No left hydronephrosis Electronically Signed   By: Donavan Foil M.D.   On: 2021/06/02 21:28   DG Chest Port 1 View  Result Date: 02-Jun-2021 CLINICAL DATA:  75 year old male with concern for sepsis. EXAM: PORTABLE CHEST 1 VIEW COMPARISON:  None. FINDINGS: No focal consolidation, pleural effusion, or pneumothorax. Borderline cardiomegaly. Atherosclerotic calcification of the aorta. Bilateral hilar prominence, likely dilatation of the main pulmonary arteries and suggestive of pulmonary hypertension. Adenopathy is less likely but not excluded. Attention on follow-up imaging recommended. IMPRESSION: 1. No acute cardiopulmonary process. 2. Borderline cardiomegaly with findings suggestive of pulmonary hypertension. Electronically Signed   By: Anner Crete M.D.   On: June 02, 2021 18:27     Scheduled Meds:  amiodarone  200 mg Oral Daily   amLODipine  10 mg Oral Daily   apixaban  5 mg Oral BID   atorvastatin  20 mg Oral QPM   clopidogrel  75 mg Oral Daily   insulin aspart  0-9 Units Subcutaneous TID WC   insulin glargine  25 Units Subcutaneous QHS   isosorbide mononitrate  30 mg Oral Daily    metoprolol tartrate  50 mg Oral BID  sodium chloride flush  3 mL Intravenous Q12H   terazosin  2 mg Oral QHS   Continuous Infusions:  ceFEPime (MAXIPIME) IV 2 g (05/07/21 0625)     LOS: 0 days    Time spent: 35 mins    Twilla Khouri, MD [facility]   If 7PM-7AM, please contact night-coverage

## 2021-05-07 NOTE — Progress Notes (Signed)
PHARMACY - PHYSICIAN COMMUNICATION CRITICAL VALUE ALERT - BLOOD CULTURE IDENTIFICATION (BCID)  [name] is an 75 y.o. male who presented to [facility] on [facility]/06/2021 with a chief complaint of fevers and dysuria  Assessment:   urosepsis is likely source Name of physician (or Provider) Contacted: Madaline Brilliant via secure chat  Current antibiotics: cefepime  Changes to prescribed antibiotics recommended:  Patient is on recommended antibiotics - No changes needed  Results for orders placed or performed during the hospital encounter of 05/06/21  Blood Culture ID Panel (Reflexed) (Collected: 05/06/2021  5:48 PM)  Result Value Ref Range   Enterococcus faecalis NOT DETECTED NOT DETECTED   Enterococcus Faecium NOT DETECTED NOT DETECTED   Listeria monocytogenes NOT DETECTED NOT DETECTED   Staphylococcus species NOT DETECTED NOT DETECTED   Staphylococcus aureus (BCID) NOT DETECTED NOT DETECTED   Staphylococcus epidermidis NOT DETECTED NOT DETECTED   Staphylococcus lugdunensis NOT DETECTED NOT DETECTED   Streptococcus species NOT DETECTED NOT DETECTED   Streptococcus agalactiae NOT DETECTED NOT DETECTED   Streptococcus pneumoniae NOT DETECTED NOT DETECTED   Streptococcus pyogenes NOT DETECTED NOT DETECTED   A.calcoaceticus-baumannii NOT DETECTED NOT DETECTED   Bacteroides fragilis NOT DETECTED NOT DETECTED   Enterobacterales NOT DETECTED NOT DETECTED   Enterobacter cloacae complex NOT DETECTED NOT DETECTED   Escherichia coli NOT DETECTED NOT DETECTED   Klebsiella aerogenes NOT DETECTED NOT DETECTED   Klebsiella oxytoca NOT DETECTED NOT DETECTED   Klebsiella pneumoniae NOT DETECTED NOT DETECTED   Proteus species NOT DETECTED NOT DETECTED   Salmonella species NOT DETECTED NOT DETECTED   Serratia marcescens NOT DETECTED NOT DETECTED   Haemophilus influenzae NOT DETECTED NOT DETECTED   Neisseria meningitidis NOT DETECTED NOT DETECTED   Pseudomonas aeruginosa DETECTED (A) NOT DETECTED    Stenotrophomonas maltophilia NOT DETECTED NOT DETECTED   Candida albicans NOT DETECTED NOT DETECTED   Candida auris NOT DETECTED NOT DETECTED   Candida glabrata NOT DETECTED NOT DETECTED   Candida krusei NOT DETECTED NOT DETECTED   Candida parapsilosis NOT DETECTED NOT DETECTED   Candida tropicalis NOT DETECTED NOT DETECTED   Cryptococcus neoformans/gattii NOT DETECTED NOT DETECTED   CTX-M ESBL NOT DETECTED NOT DETECTED   Carbapenem resistance IMP NOT DETECTED NOT DETECTED   Carbapenem resistance KPC NOT DETECTED NOT DETECTED   Carbapenem resistance NDM NOT DETECTED NOT DETECTED   Carbapenem resistance VIM NOT DETECTED NOT DETECTED    Eudelia Bunch, Pharm.D 05/07/2021 5:59 PM

## 2021-05-08 ENCOUNTER — Encounter (HOSPITAL_COMMUNITY): Payer: Self-pay | Admitting: Urology

## 2021-05-08 DIAGNOSIS — I480 Paroxysmal atrial fibrillation: Secondary | ICD-10-CM | POA: Diagnosis present

## 2021-05-08 DIAGNOSIS — N136 Pyonephrosis: Secondary | ICD-10-CM | POA: Diagnosis present

## 2021-05-08 DIAGNOSIS — Z794 Long term (current) use of insulin: Secondary | ICD-10-CM | POA: Diagnosis not present

## 2021-05-08 DIAGNOSIS — I152 Hypertension secondary to endocrine disorders: Secondary | ICD-10-CM | POA: Diagnosis present

## 2021-05-08 DIAGNOSIS — I472 Ventricular tachycardia: Secondary | ICD-10-CM | POA: Diagnosis present

## 2021-05-08 DIAGNOSIS — R7881 Bacteremia: Secondary | ICD-10-CM | POA: Diagnosis present

## 2021-05-08 DIAGNOSIS — G4733 Obstructive sleep apnea (adult) (pediatric): Secondary | ICD-10-CM | POA: Diagnosis present

## 2021-05-08 DIAGNOSIS — E1169 Type 2 diabetes mellitus with other specified complication: Secondary | ICD-10-CM | POA: Diagnosis present

## 2021-05-08 DIAGNOSIS — Z7984 Long term (current) use of oral hypoglycemic drugs: Secondary | ICD-10-CM | POA: Diagnosis not present

## 2021-05-08 DIAGNOSIS — N390 Urinary tract infection, site not specified: Secondary | ICD-10-CM | POA: Diagnosis present

## 2021-05-08 DIAGNOSIS — E785 Hyperlipidemia, unspecified: Secondary | ICD-10-CM | POA: Diagnosis present

## 2021-05-08 DIAGNOSIS — N1831 Chronic kidney disease, stage 3a: Secondary | ICD-10-CM | POA: Diagnosis present

## 2021-05-08 DIAGNOSIS — N179 Acute kidney failure, unspecified: Secondary | ICD-10-CM | POA: Diagnosis present

## 2021-05-08 DIAGNOSIS — Z7902 Long term (current) use of antithrombotics/antiplatelets: Secondary | ICD-10-CM | POA: Diagnosis not present

## 2021-05-08 DIAGNOSIS — Z8249 Family history of ischemic heart disease and other diseases of the circulatory system: Secondary | ICD-10-CM | POA: Diagnosis not present

## 2021-05-08 DIAGNOSIS — E1122 Type 2 diabetes mellitus with diabetic chronic kidney disease: Secondary | ICD-10-CM | POA: Diagnosis present

## 2021-05-08 DIAGNOSIS — Z7901 Long term (current) use of anticoagulants: Secondary | ICD-10-CM | POA: Diagnosis not present

## 2021-05-08 DIAGNOSIS — B965 Pseudomonas (aeruginosa) (mallei) (pseudomallei) as the cause of diseases classified elsewhere: Secondary | ICD-10-CM | POA: Diagnosis present

## 2021-05-08 DIAGNOSIS — Z20822 Contact with and (suspected) exposure to covid-19: Secondary | ICD-10-CM | POA: Diagnosis present

## 2021-05-08 DIAGNOSIS — Z955 Presence of coronary angioplasty implant and graft: Secondary | ICD-10-CM | POA: Diagnosis not present

## 2021-05-08 DIAGNOSIS — Z79899 Other long term (current) drug therapy: Secondary | ICD-10-CM | POA: Diagnosis not present

## 2021-05-08 DIAGNOSIS — I2510 Atherosclerotic heart disease of native coronary artery without angina pectoris: Secondary | ICD-10-CM | POA: Diagnosis present

## 2021-05-08 LAB — CBC
HCT: 29 % — ABNORMAL LOW (ref 39.0–52.0)
Hemoglobin: 9.2 g/dL — ABNORMAL LOW (ref 13.0–17.0)
MCH: 25.8 pg — ABNORMAL LOW (ref 26.0–34.0)
MCHC: 31.7 g/dL (ref 30.0–36.0)
MCV: 81.2 fL (ref 80.0–100.0)
Platelets: 147 10*3/uL — ABNORMAL LOW (ref 150–400)
RBC: 3.57 MIL/uL — ABNORMAL LOW (ref 4.22–5.81)
RDW: 15 % (ref 11.5–15.5)
WBC: 2.9 10*3/uL — ABNORMAL LOW (ref 4.0–10.5)
nRBC: 0 % (ref 0.0–0.2)

## 2021-05-08 LAB — BASIC METABOLIC PANEL
Anion gap: 9 (ref 5–15)
BUN: 26 mg/dL — ABNORMAL HIGH (ref 8–23)
CO2: 24 mmol/L (ref 22–32)
Calcium: 8.4 mg/dL — ABNORMAL LOW (ref 8.9–10.3)
Chloride: 105 mmol/L (ref 98–111)
Creatinine, Ser: 1.8 mg/dL — ABNORMAL HIGH (ref 0.61–1.24)
GFR, Estimated: 39 mL/min — ABNORMAL LOW (ref >60)
Glucose, Bld: 157 mg/dL — ABNORMAL HIGH (ref 70–99)
Potassium: 3.2 mmol/L — ABNORMAL LOW (ref 3.5–5.1)
Sodium: 138 mmol/L (ref 135–145)

## 2021-05-08 LAB — GLUCOSE, CAPILLARY
Glucose-Capillary: 164 mg/dL — ABNORMAL HIGH (ref 70–99)
Glucose-Capillary: 205 mg/dL — ABNORMAL HIGH (ref 70–99)
Glucose-Capillary: 194 mg/dL — ABNORMAL HIGH (ref 70–99)
Glucose-Capillary: 223 mg/dL — ABNORMAL HIGH (ref 70–99)

## 2021-05-08 LAB — MAGNESIUM: Magnesium: 1.6 mg/dL — ABNORMAL LOW (ref 1.7–2.4)

## 2021-05-08 LAB — PHOSPHORUS: Phosphorus: 3.2 mg/dL (ref 2.5–4.6)

## 2021-05-08 MED ORDER — PREDNISOLONE ACETATE 1 % OP SUSP
1.0000 [drp] | Freq: Two times a day (BID) | OPHTHALMIC | Status: DC
  Administered 2021-05-08 – 2021-05-10 (×4): 1 [drp] via OPHTHALMIC
  Filled 2021-05-08: qty 5

## 2021-05-08 MED ORDER — MAGNESIUM SULFATE 2 GM/50ML IV SOLN
2.0000 g | Freq: Once | INTRAVENOUS | Status: AC
  Administered 2021-05-08: 2 g via INTRAVENOUS
  Filled 2021-05-08: qty 50

## 2021-05-08 MED ORDER — POTASSIUM CHLORIDE 20 MEQ PO PACK
40.0000 meq | PACK | Freq: Once | ORAL | Status: AC
  Administered 2021-05-08: 40 meq via ORAL
  Filled 2021-05-08: qty 2

## 2021-05-08 NOTE — Progress Notes (Signed)
PROGRESS NOTE    [name]  BPZ:[id number] DOB: Jan 25, 1946 DOA: 05/06/2021 PCP: [name], MD    Brief Narrative:  This 75 years old male with PMH significant for CAD s/p PCI with DES s/p PCI with DES to RCA 11/2020, PAF on Eliquis and amiodarone, NSVT, CKD stage IIIa, IDT2DM, HTN, HLD, OSA on CPAP, and recent admission for right hydronephrosis due to obstructing mass who presents to the ED for evaluation of fevers and dysuria.  Patient recently admitted 04/30/2021-05/05/2021 for AKI on CKD secondary to right hydronephrosis from obstructing mass.  He underwent right ureteral stent placement on 05/01/2021 by urology, Dr. Junious Silk.  Foley catheter was placed postoperatively and removed on day of discharge.  He was also found to have pathologic abdominal lymphadenopathy and pelvic masses.  He underwent IR guided biopsy of the right infrarenal retroperitoneal mass on 6/20.  Pathology results pending and he was scheduled to follow with urology and oncology as an outpatient. Patient presented with increased urinary frequency, dysuria and suprapubic discomfort and straining to urinate,  denies any hematuria.  He is found to have fever.  UA consistent with UTI. He is admitted for complicated UTI,  started on empiric IV antibiotics.  Blood cultures positive for Pseudomonas, continue on cefepime until sensitivity is pending,  renal ultrasound shows improving hydronephrosis.  Assessment & Plan:   Principal Problem:   Complicated UTI (urinary tract infection) Active Problems:   Acute kidney injury superimposed on CKD (HCC)   CAD (coronary artery disease)   DM2 (diabetes mellitus, type 2) (HCC)   PAF (paroxysmal atrial fibrillation) ([place])   Hypertension associated with diabetes ([place])   Sleep apnea   Hyperlipidemia associated with type 2 diabetes mellitus ([place])  Complicated UTI : Patient presented with urinary symptoms, increased urinary frequency, straining while urination, dysuria and  fever at home. He recently hospitalized and underwent ureteral stenting and temporary Foley catheter placement. Right hydronephrosis due to obstructing mass s/p right ureteral stent placement 05/01/21: Continue empiric cefepime Blood cultures grew Pseudomonas, continue IV cefepime.  Until sensitivities back. Renal ultrasound shows improving hydronephrosis. Continue gentle IV hydration. Monitor strict I/O's   AKI on CKD stage IIIa: Continue IV fluids , avoid nephrotoxic medications. Continue to monitor serum creatinine.   Pelvic masses/abdominal lymphadenopathy: Pathologic mesenteric, retroperitoneal, and pelvic adenopathy seen on CT A/P 04/30/2021.   S/p IR guided biopsy of right infrarenal retroperitoneal mass. pathology results pending, follow-up with oncology   Paroxysmal atrial fibrillation: Remains in sinus rhythm .   Continue home Lopressor, amiodarone, and Eliquis.   CAD s/p PCI with DES to RCA 11/2020: Denies any chest pain.  Continue Lopressor, Imdur, Eliquis, Plavix, atorvastatin.   Insulin-dependent type 2 diabetes: Continue reduced home Lantus 25 units nightly, SSI.   Hold home glipizide and metformin.   Hypertension: Continue amlodipine, Lopressor, Imdur.   OSA: Continue CPAP nightly.   DVT prophylaxis: Eliquis Code Status: Full code Family Communication: No family at bedside Disposition Plan:   Status is: Inpatient  Remains inpatient appropriate because:Inpatient level of care appropriate due to severity of illness  Dispo: The patient is from: Home              Anticipated d/c is to: Home              Patient currently is not medically stable to d/c.   Difficult to place patient No  Consultants:  None  Procedures:  Renal US Antimicrobials:   Anti-infectives (From admission, onward)    Start  Dose/Rate Route Frequency Ordered Stop   05/07/21 0600  ceFEPIme (MAXIPIME) 2 g in sodium chloride 0.9 % 100 mL IVPB        2 g 200 mL/hr over 30 Minutes  Intravenous Every 12 hours 05/06/21 2131     05/06/21 1730  ceFEPIme (MAXIPIME) 2 g in sodium chloride 0.9 % 100 mL IVPB        2 g 200 mL/hr over 30 Minutes Intravenous  Once 05/06/21 1729 05/06/21 1829       Subjective: Patient was seen and examined at bedside.  Overnight events noted.   Patient reports feeling much improved.  He is concerned about orange colored urine, He was given pyridium yesterday.  [place] interpreter [place] ID # [phone] used.  Objective: Vitals:   05/07/21 1013 05/07/21 1341 05/07/21 2044 05/08/21 0641  BP: (!) 157/71 132/71 (!) 146/76 131/80  Pulse: 70 63 68 63  Resp: 20 20 20 18   Temp: 98.5 F (36.9 C) 98.7 F (37.1 C) 99.8 F (37.7 C) (!) 97.5 F (36.4 C)  TempSrc: Oral Oral Oral Oral  SpO2: 98% 98% 98% 98%    Intake/Output Summary (Last 24 hours) at 05/08/2021 1447 Last data filed at 05/08/2021 0600 Gross per 24 hour  Intake 240 ml  Output --  Net 240 ml   There were no vitals filed for this visit.  Examination:  General exam: Appears calm and comfortable , not in any acute distress. Appears pleasant. Respiratory system: Clear to auscultation. Respiratory effort normal. Cardiovascular system: S1 & S2 heard, RRR. No JVD, murmurs, rubs, gallops or clicks. No pedal edema. Gastrointestinal system: Abdomen is nondistended, soft and nontender. No organomegaly or masses felt. Normal bowel sounds heard. Central nervous system: Alert and oriented. No focal neurological deficits. Extremities:   No edema, no cyanosis, no clubbing. Skin: No rashes, lesions or ulcers Psychiatry: Judgement and insight appear normal. Mood & affect appropriate.     Data Reviewed: I have personally reviewed following labs and imaging studies  CBC: Recent Labs  Lab 05/02/21 0629 05/03/21 1478 05/04/21 0424 05/05/21 0556 05/06/21 1747 05/07/21 0624 05/08/21 0454  WBC 4.9   < > 3.0* 2.7* 4.4 3.6* 2.9*  NEUTROABS 2.7  --   --   --  1.5*  --   --   HGB 11.1*   < >  10.0* 10.6* 9.2* 9.3* 9.2*  HCT 34.7*   < > 31.5* 33.8* 28.5* 29.2* 29.0*  MCV 81.3   < > 82.0 83.0 80.1 80.4 81.2  PLT 189   < > 181 198 176 166 147*   < > = values in this interval not displayed.   Basic Metabolic Panel: Recent Labs  Lab 05/04/21 0424 05/05/21 0556 05/06/21 1747 05/07/21 0624 05/08/21 0454  NA 142 142 137 140 138  K 3.9 3.7 3.4* 3.4* 3.2*  CL 111 108 106 105 105  CO2 24 26 25 26 24   GLUCOSE 192* 182* 178* 192* 157*  BUN 27* 23 23 23  26*  CREATININE 1.91* 1.57* 1.84* 1.84* 1.80*  CALCIUM 8.2* 8.7* 8.3* 8.4* 8.4*  MG  --   --   --   --  1.6*  PHOS  --   --   --   --  3.2   GFR: Estimated Creatinine Clearance: 46.1 mL/min (A) (by C-G formula based on SCr of 1.8 mg/dL (H)). Liver Function Tests: Recent Labs  Lab 05/06/21 1747  AST 18  ALT 17  ALKPHOS 55  BILITOT 0.7  PROT 5.3*  ALBUMIN 3.0*   No results for input(s): LIPASE, AMYLASE in the last 168 hours. No results for input(s): AMMONIA in the last 168 hours. Coagulation Profile: Recent Labs  Lab 05/02/21 0629 05/06/21 1747  INR 1.5* 2.0*   Cardiac Enzymes: No results for input(s): CKTOTAL, CKMB, CKMBINDEX, TROPONINI in the last 168 hours. BNP (last 3 results) No results for input(s): PROBNP in the last 8760 hours. HbA1C: No results for input(s): HGBA1C in the last 72 hours. CBG: Recent Labs  Lab 05/07/21 1201 05/07/21 1706 05/07/21 2043 05/08/21 0729 05/08/21 1157  GLUCAP 278* 217* 174* 164* 205*   Lipid Profile: No results for input(s): CHOL, HDL, LDLCALC, TRIG, CHOLHDL, LDLDIRECT in the last 72 hours. Thyroid Function Tests: No results for input(s): TSH, T4TOTAL, FREET4, T3FREE, THYROIDAB in the last 72 hours. Anemia Panel: No results for input(s): VITAMINB12, FOLATE, FERRITIN, TIBC, IRON, RETICCTPCT in the last 72 hours. Sepsis Labs: Recent Labs  Lab 05/06/21 1747 05/06/21 2204 05/07/21 0624  PROCALCITON  --   --  0.50  LATICACIDVEN 1.1 1.0  --     Recent Results (from  the past 240 hour(s))  Resp Panel by RT-PCR (Flu A&B, Covid) Nasopharyngeal Swab     Status: None   Collection Time: 04/30/21  2:58 PM   Specimen: Nasopharyngeal Swab; Nasopharyngeal(NP) swabs in vial transport medium  Result Value Ref Range Status   SARS Coronavirus 2 by RT PCR NEGATIVE NEGATIVE Final    Comment: (NOTE) SARS-CoV-2 target nucleic acids are NOT DETECTED.  The SARS-CoV-2 RNA is generally detectable in upper respiratory specimens during the acute phase of infection. The lowest concentration of SARS-CoV-2 viral copies this assay can detect is 138 copies/mL. A negative result does not preclude SARS-Cov-2 infection and should not be used as the sole basis for treatment or other patient management decisions. A negative result may occur with  improper specimen collection/handling, submission of specimen other than nasopharyngeal swab, presence of viral mutation(s) within the areas targeted by this assay, and inadequate number of viral copies(<138 copies/mL). A negative result must be combined with clinical observations, patient history, and epidemiological information. The expected result is Negative.  Fact Sheet for Patients:  EntrepreneurPulse.com.au  Fact Sheet for Healthcare Providers:  IncredibleEmployment.be  This test is no t yet approved or cleared by the [place] FDA and  has been authorized for detection and/or diagnosis of SARS-CoV-2 by FDA under an Emergency Use Authorization (EUA). This EUA will remain  in effect (meaning this test can be used) for the duration of the COVID-19 declaration under Section 564(b)(1) of the Act, 21 U.S.C.section 360bbb-3(b)(1), unless the authorization is terminated  or revoked sooner.       Influenza A by PCR NEGATIVE NEGATIVE Final   Influenza B by PCR NEGATIVE NEGATIVE Final    Comment: (NOTE) The Xpert Xpress SARS-CoV-2/FLU/RSV plus assay is intended as an aid in the diagnosis of  influenza from Nasopharyngeal swab specimens and should not be used as a sole basis for treatment. Nasal washings and aspirates are unacceptable for Xpert Xpress SARS-CoV-2/FLU/RSV testing.  Fact Sheet for Patients: EntrepreneurPulse.com.au  Fact Sheet for Healthcare Providers: IncredibleEmployment.be  This test is not yet approved or cleared by the [place] FDA and has been authorized for detection and/or diagnosis of SARS-CoV-2 by FDA under an Emergency Use Authorization (EUA). This EUA will remain in effect (meaning this test can be used) for the duration of the COVID-19 declaration under Section 564(b)(1) of the Act, 21 U.S.C.  section 360bbb-3(b)(1), unless the authorization is terminated or revoked.  Performed at [facility], [address] [place], [place] [address]   Culture, blood (routine x 2)     Status: None   Collection Time: 05/02/21 10:00 AM   Specimen: BLOOD  Result Value Ref Range Status   Specimen Description   Final    BLOOD LEFT ANTECUBITAL Performed at [facility] [address] [place], [place] [address]    Special Requests   Final    BOTTLES DRAWN AEROBIC ONLY Blood Culture adequate volume Performed at [facility] [address] [place], [place] [address]    Culture   Final    NO GROWTH 5 DAYS Performed at [facility], [place] [address], [place], [place] [address]    Report Status 05/07/2021 FINAL  Final  Culture, blood (routine x 2)     Status: None   Collection Time: 05/02/21 10:00 AM   Specimen: BLOOD  Result Value Ref Range Status   Specimen Description   Final    BLOOD BLOOD LEFT HAND Performed at [facility] [address] [place], [place] [address]    Special Requests   Final    BOTTLES DRAWN AEROBIC ONLY Blood Culture adequate volume Performed at [facility] [address],  [place], [place] [address]    Culture   Final    NO GROWTH 5 DAYS Performed at [facility], [place] [address], [place], [place] [address]    Report Status 05/07/2021 FINAL  Final  Blood Culture (routine x 2)     Status: Abnormal (Preliminary result)   Collection Time: 05/06/21  5:48 PM   Specimen: BLOOD LEFT FOREARM  Result Value Ref Range Status   Specimen Description   Final    BLOOD LEFT FOREARM Performed at [facility] [address] [place], [place] [address]    Special Requests   Final    BOTTLES DRAWN AEROBIC AND ANAEROBIC Blood Culture adequate volume Performed at [facility] [address] [place],  [address]    Culture  Setup Time   Final    GRAM NEGATIVE RODS ANAEROBIC BOTTLE ONLY Organism ID to follow CRITICAL RESULT CALLED TO, READ BACK BY AND VERIFIED WITH: Colin Rhein PHARMD [id number] 05/07/21 A BROWNING    Culture (A)  Final    PSEUDOMONAS AERUGINOSA SUSCEPTIBILITIES TO FOLLOW Performed at [facility], [place] [address] [place],  [address]    Report Status PENDING  Incomplete  Blood Culture ID Panel (Reflexed)     Status: Abnormal   Collection Time: 05/06/21  5:48 PM  Result Value Ref Range Status   Enterococcus faecalis NOT DETECTED NOT DETECTED Final   Enterococcus Faecium NOT DETECTED NOT DETECTED Final   Listeria monocytogenes NOT DETECTED NOT DETECTED Final   Staphylococcus species NOT DETECTED NOT DETECTED Final   Staphylococcus aureus (BCID) NOT DETECTED NOT DETECTED Final   Staphylococcus epidermidis NOT DETECTED NOT DETECTED Final   Staphylococcus lugdunensis NOT DETECTED NOT DETECTED Final   Streptococcus species NOT DETECTED NOT DETECTED Final   Streptococcus agalactiae NOT DETECTED NOT DETECTED Final   Streptococcus pneumoniae NOT DETECTED NOT DETECTED Final   Streptococcus pyogenes NOT DETECTED NOT DETECTED Final   A.calcoaceticus-baumannii NOT DETECTED NOT DETECTED Final   Bacteroides  fragilis NOT DETECTED NOT DETECTED Final   Enterobacterales NOT DETECTED NOT DETECTED Final   Enterobacter cloacae complex NOT DETECTED NOT  DETECTED Final   Escherichia coli NOT DETECTED NOT DETECTED Final   Klebsiella aerogenes NOT DETECTED NOT DETECTED Final   Klebsiella oxytoca NOT DETECTED NOT DETECTED Final   Klebsiella pneumoniae NOT DETECTED NOT DETECTED Final   Proteus species NOT DETECTED NOT DETECTED Final   Salmonella species NOT DETECTED NOT DETECTED Final   Serratia marcescens NOT DETECTED NOT DETECTED Final   Haemophilus influenzae NOT DETECTED NOT DETECTED Final   Neisseria meningitidis NOT DETECTED NOT DETECTED Final   Pseudomonas aeruginosa DETECTED (A) NOT DETECTED Final    Comment: CRITICAL RESULT CALLED TO, READ BACK BY AND VERIFIED WITH: Colin Rhein PHARMD 1753 05/07/21 A BROWNING    Stenotrophomonas maltophilia NOT DETECTED NOT DETECTED Final   Candida albicans NOT DETECTED NOT DETECTED Final   Candida auris NOT DETECTED NOT DETECTED Final   Candida glabrata NOT DETECTED NOT DETECTED Final   Candida krusei NOT DETECTED NOT DETECTED Final   Candida parapsilosis NOT DETECTED NOT DETECTED Final   Candida tropicalis NOT DETECTED NOT DETECTED Final   Cryptococcus neoformans/gattii NOT DETECTED NOT DETECTED Final   CTX-M ESBL NOT DETECTED NOT DETECTED Final   Carbapenem resistance IMP NOT DETECTED NOT DETECTED Final   Carbapenem resistance KPC NOT DETECTED NOT DETECTED Final   Carbapenem resistance NDM NOT DETECTED NOT DETECTED Final   Carbapenem resistance VIM NOT DETECTED NOT DETECTED Final    Comment: Performed at [facility], [address], [place], [place] [address]  Resp Panel by RT-PCR (Flu A&B, Covid) Nasopharyngeal Swab     Status: None   Collection Time: 05/06/21  5:52 PM   Specimen: Nasopharyngeal Swab; Nasopharyngeal(NP) swabs in vial transport medium  Result Value Ref Range Status   SARS Coronavirus 2 by RT PCR NEGATIVE NEGATIVE Final    Comment:  (NOTE) SARS-CoV-2 target nucleic acids are NOT DETECTED.  The SARS-CoV-2 RNA is generally detectable in upper respiratory specimens during the acute phase of infection. The lowest concentration of SARS-CoV-2 viral copies this assay can detect is 138 copies/mL. A negative result does not preclude SARS-Cov-2 infection and should not be used as the sole basis for treatment or other patient management decisions. A negative result may occur with  improper specimen collection/handling, submission of specimen other than nasopharyngeal swab, presence of viral mutation(s) within the areas targeted by this assay, and inadequate number of viral copies(<138 copies/mL). A negative result must be combined with clinical observations, patient history, and epidemiological information. The expected result is Negative.  Fact Sheet for Patients:  EntrepreneurPulse.com.au  Fact Sheet for Healthcare Providers:  IncredibleEmployment.be  This test is no t yet approved or cleared by the [place] FDA and  has been authorized for detection and/or diagnosis of SARS-CoV-2 by FDA under an Emergency Use Authorization (EUA). This EUA will remain  in effect (meaning this test can be used) for the duration of the COVID-19 declaration under Section 564(b)(1) of the Act, 21 U.S.C.section 360bbb-3(b)(1), unless the authorization is terminated  or revoked sooner.       Influenza A by PCR NEGATIVE NEGATIVE Final   Influenza B by PCR NEGATIVE NEGATIVE Final    Comment: (NOTE) The Xpert Xpress SARS-CoV-2/FLU/RSV plus assay is intended as an aid in the diagnosis of influenza from Nasopharyngeal swab specimens and should not be used as a sole basis for treatment. Nasal washings and aspirates are unacceptable for Xpert Xpress SARS-CoV-2/FLU/RSV testing.  Fact Sheet for Patients: EntrepreneurPulse.com.au  Fact Sheet for Healthcare  Providers: IncredibleEmployment.be  This test is  not yet approved or cleared by the [place] and has been authorized for detection and/or diagnosis of SARS-CoV-2 by FDA under an Emergency Use Authorization (EUA). This EUA will remain in effect (meaning this test can be used) for the duration of the COVID-19 declaration under Section 564(b)(1) of the Act, 21 U.S.C. section 360bbb-3(b)(1), unless the authorization is terminated or revoked.  Performed at [facility], [place] [address] [place], [place] [address]   Blood Culture (routine x 2)     Status: None (Preliminary result)   Collection Time: May 16, 2021  6:02 PM   Specimen: BLOOD  Result Value Ref Range Status   Specimen Description   Final    BLOOD RIGHT ANTECUBITAL Performed at [facility] [address], [place], [place] [address]    Special Requests   Final    BOTTLES DRAWN AEROBIC AND ANAEROBIC Blood Culture adequate volume Performed at [facility] [address], [place], [place] [address]    Culture  Setup Time   Final    GRAM NEGATIVE RODS AEROBIC BOTTLE ONLY CRITICAL VALUE NOTED.  VALUE IS CONSISTENT WITH PREVIOUSLY REPORTED AND CALLED VALUE. Performed at [facility], [place] [address] [place], [place] [address]    Culture GRAM NEGATIVE RODS  Final   Report Status PENDING  Incomplete  Urine culture     Status: Abnormal (Preliminary result)   Collection Time: 05-16-21  6:24 PM   Specimen: In/Out Cath Urine  Result Value Ref Range Status   Specimen Description   Final    IN/OUT CATH URINE Performed at [facility], [place] [address] [place], [place] [address]    Special Requests   Final    NONE Performed at [facility], [place] [address] [place],  [address]    Culture 70,000 COLONIES/mL PSEUDOMONAS AERUGINOSA (A)  Final   Report Status PENDING  Incomplete    Radiology Studies: US  RENAL  Result Date: 2021/05/16 CLINICAL DATA:  Acute on chronic kidney disease EXAM: RENAL / URINARY TRACT ULTRASOUND COMPLETE COMPARISON:  CT 04/30/2021 FINDINGS: Right Kidney: Renal measurements: 11.8 x 7.5 x 5.6 cm = volume: 259.6 mL. Echogenicity within normal limits. Mild right hydronephrosis which is likely improved as compared with recent CT. 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 Left Kidney: Renal measurements: 12.3 x 6 x 5.4 cm = volume: 7.7 mL. Small cyst at the midpole measuring 12 x 14 x 11 mm additional small cyst at the midpole measuring 12 x 9 x 15 mm. Bladder: Appears normal for degree of bladder distention. Other: None. IMPRESSION: 1. Mild right hydronephrosis probably improved as compared with prior CT. Right UPJ region mass on CT is not well demonstrated on sonogram today. 2. Small left renal cysts.  No left hydronephrosis Electronically Signed   By: Donavan Foil M.D.   On: 05-16-21 21:28   DG Chest Port 1 View  Result Date: 05-16-21 CLINICAL DATA:  75 year old male with concern for sepsis. EXAM: PORTABLE CHEST 1 VIEW COMPARISON:  None. FINDINGS: No focal consolidation, pleural effusion, or pneumothorax. Borderline cardiomegaly. Atherosclerotic calcification of the aorta. Bilateral hilar prominence, likely dilatation of the main pulmonary arteries and suggestive of pulmonary hypertension. Adenopathy is less likely but not excluded. Attention on follow-up imaging recommended. IMPRESSION: 1. No acute cardiopulmonary process. 2. Borderline cardiomegaly with findings suggestive of pulmonary hypertension. Electronically Signed   By: Anner Crete M.D.   On: May 16, 2021 18:27  Scheduled Meds:  amiodarone  200 mg Oral Daily   amLODipine  10 mg Oral Daily   apixaban  5 mg Oral BID   atorvastatin  20 mg Oral QPM   clopidogrel  75 mg Oral Daily   insulin aspart  0-9 Units Subcutaneous TID WC   insulin glargine  25 Units Subcutaneous QHS   isosorbide  mononitrate  30 mg Oral Daily   metoprolol tartrate  50 mg Oral BID   sodium chloride flush  3 mL Intravenous Q12H   terazosin  2 mg Oral QHS   Continuous Infusions:  ceFEPime (MAXIPIME) IV 2 g (05/08/21 0539)     LOS: 0 days    Time spent: 25 mins    Oddie Bottger, MD [facility]   If 7PM-7AM, please contact night-coverage

## 2021-05-08 NOTE — Progress Notes (Signed)
Pharmacy Antibiotic Note  [name] is a 75 y.o. male with hx CKD secondary to right hydronephrosis from obstructing mass (s/p ureteral stent placement on 6/17) presented to the ED on 6/22 with fever and dysuria.  He's currently on cefepime for PsA UTI and bacteremia.  Today, 05/08/2021: - day #3 cefepime - afeb, wbc low - scr stable at 1.80 (crcl~46)   Plan: - continue cefepime 2gm IV q12h - f/u suscept. for PsA and renal function  _________________________________________  Temp (24hrs), Avg:98.7 F (37.1 C), Min:97.5 F (36.4 C), Max:99.8 F (37.7 C)  Recent Labs  Lab 05/04/21 0424 05/05/21 0556 05/06/21 1747 05/06/21 2204 05/07/21 0624 05/08/21 0454  WBC 3.0* 2.7* 4.4  --  3.6* 2.9*  CREATININE 1.91* 1.57* 1.84*  --  1.84* 1.80*  LATICACIDVEN  --   --  1.1 1.0  --   --     Estimated Creatinine Clearance: 46.1 mL/min (A) (by C-G formula based on SCr of 1.8 mg/dL (H)).    No Known Allergies  Antimicrobials this admission: 6/22 Cefepime >>     Microbiology results: 6/22 BCx2: 1/4 anaerobic GNR- PsA 6/22 UCx: 70K PsA   Thank you for allowing pharmacy to be a part of this patient's care.  Lynelle Doctor 05/08/2021 11:36 AM

## 2021-05-09 LAB — CBC
HCT: 29.1 % — ABNORMAL LOW (ref 39.0–52.0)
Hemoglobin: 9.2 g/dL — ABNORMAL LOW (ref 13.0–17.0)
MCH: 25.8 pg — ABNORMAL LOW (ref 26.0–34.0)
MCHC: 31.6 g/dL (ref 30.0–36.0)
MCV: 81.7 fL (ref 80.0–100.0)
Platelets: 171 10*3/uL (ref 150–400)
RBC: 3.56 MIL/uL — ABNORMAL LOW (ref 4.22–5.81)
RDW: 14.8 % (ref 11.5–15.5)
WBC: 2.6 10*3/uL — ABNORMAL LOW (ref 4.0–10.5)
nRBC: 0 % (ref 0.0–0.2)

## 2021-05-09 LAB — GLUCOSE, CAPILLARY
Glucose-Capillary: 180 mg/dL — ABNORMAL HIGH (ref 70–99)
Glucose-Capillary: 259 mg/dL — ABNORMAL HIGH (ref 70–99)
Glucose-Capillary: 197 mg/dL — ABNORMAL HIGH (ref 70–99)
Glucose-Capillary: 273 mg/dL — ABNORMAL HIGH (ref 70–99)

## 2021-05-09 LAB — BASIC METABOLIC PANEL
Anion gap: 7 (ref 5–15)
BUN: 25 mg/dL — ABNORMAL HIGH (ref 8–23)
CO2: 28 mmol/L (ref 22–32)
Calcium: 8.3 mg/dL — ABNORMAL LOW (ref 8.9–10.3)
Chloride: 104 mmol/L (ref 98–111)
Creatinine, Ser: 1.79 mg/dL — ABNORMAL HIGH (ref 0.61–1.24)
GFR, Estimated: 40 mL/min — ABNORMAL LOW (ref >60)
Glucose, Bld: 170 mg/dL — ABNORMAL HIGH (ref 70–99)
Potassium: 3.5 mmol/L (ref 3.5–5.1)
Sodium: 139 mmol/L (ref 135–145)

## 2021-05-09 LAB — URINE CULTURE: Culture: 70000 — AB

## 2021-05-09 LAB — CULTURE, BLOOD (ROUTINE X 2): Special Requests: ADEQUATE

## 2021-05-09 LAB — PHOSPHORUS: Phosphorus: 3 mg/dL (ref 2.5–4.6)

## 2021-05-09 LAB — MAGNESIUM: Magnesium: 1.8 mg/dL (ref 1.7–2.4)

## 2021-05-09 MED ORDER — SODIUM CHLORIDE 0.9 % IV SOLN: INTRAVENOUS | Status: DC | PRN

## 2021-05-09 MED ORDER — SODIUM CHLORIDE 0.9 % IV SOLN: INTRAVENOUS | Status: DC

## 2021-05-09 NOTE — Progress Notes (Signed)
PROGRESS NOTE    [name]  YSA:[id number] DOB: 1945/12/19 DOA: 05/06/2021 PCP: [name], MD    Brief Narrative:  This 75 years old male with PMH significant for CAD s/p PCI with DES s/p PCI with DES to RCA 11/2020, PAF on Eliquis and amiodarone, NSVT, CKD stage IIIa, IDT2DM, HTN, HLD, OSA on CPAP, and recent admission for right hydronephrosis due to obstructing mass who presents to the ED for evaluation of fevers and dysuria.  Patient recently admitted 04/30/2021-05/05/2021 for AKI on CKD secondary to right hydronephrosis from obstructing mass.  He underwent right ureteral stent placement on 05/01/2021 by urology, Dr. Junious Silk.  Foley catheter was placed postoperatively and removed on day of discharge.  He was also found to have pathologic abdominal lymphadenopathy and pelvic masses.  He underwent IR guided biopsy of the right infrarenal retroperitoneal mass on 6/20.  Pathology results pending and he was scheduled to follow with urology and oncology as an outpatient. Patient presented with increased urinary frequency, dysuria and suprapubic discomfort and straining to urinate,  denies any hematuria.  He is found to have fever.  UA consistent with UTI. He is admitted for complicated UTI,  started on empiric IV antibiotics.  Blood cultures positive for Pseudomonas, continue on cefepime until sensitivity is pending,  renal ultrasound shows improving hydronephrosis.  Assessment & Plan:   Principal Problem:   Complicated UTI (urinary tract infection) Active Problems:   Acute kidney injury superimposed on CKD (HCC)   CAD (coronary artery disease)   DM2 (diabetes mellitus, type 2) (HCC)   PAF (paroxysmal atrial fibrillation) ([place])   Hypertension associated with diabetes ([place])   Sleep apnea   Hyperlipidemia associated with type 2 diabetes mellitus ([place])  Complicated UTI : Patient presented with urinary symptoms, increased urinary frequency, straining while urination, dysuria and  fever at home. He recently hospitalized and underwent ureteral stenting and temporary Foley catheter placement. Right hydronephrosis due to obstructing mass s/p right ureteral stent placement 05/01/21: Continue empiric cefepime Blood cultures grew Pseudomonas, continue IV cefepime.  Renal ultrasound shows improving hydronephrosis. Continue gentle IV hydration. Monitor strict I/O's Urology consulted, awaiting follow up.   AKI on CKD stage IIIa: > Improving Continue IV fluids , avoid nephrotoxic medications. Continue to monitor serum creatinine.   Pelvic masses/abdominal lymphadenopathy: Pathologic mesenteric, retroperitoneal, and pelvic adenopathy seen on CT A/P 04/30/2021.   S/p IR guided biopsy of right infrarenal retroperitoneal mass. pathology results pending, follow-up with oncology   Paroxysmal atrial fibrillation: Remains in sinus rhythm .   Continue home Lopressor, amiodarone, and Eliquis.   CAD s/p PCI with DES to RCA 11/2020: Denies any chest pain.  Continue Lopressor, Imdur, Eliquis, Plavix, atorvastatin.   Insulin-dependent type 2 diabetes: Continue reduced home Lantus 25 units nightly, SSI.   Hold home glipizide and metformin.   Hypertension: Continue amlodipine, Lopressor, Imdur.   OSA: Continue CPAP nightly.   DVT prophylaxis: Eliquis Code Status: Full code Family Communication: No family at bedside Disposition Plan:   Status is: Inpatient  Remains inpatient appropriate because:Inpatient level of care appropriate due to severity of illness  Dispo: The patient is from: Home              Anticipated d/c is to: Home 05/10/21.              Patient currently is not medically stable to d/c.   Difficult to place patient No  Consultants:  None  Procedures:  Renal US Antimicrobials:   Anti-infectives (From admission,  onward)    Start     Dose/Rate Route Frequency Ordered Stop   05/07/21 0600  ceFEPIme (MAXIPIME) 2 g in sodium chloride 0.9 % 100 mL IVPB         2 g 200 mL/hr over 30 Minutes Intravenous Every 12 hours 05/06/21 2131     05/06/21 1730  ceFEPIme (MAXIPIME) 2 g in sodium chloride 0.9 % 100 mL IVPB        2 g 200 mL/hr over 30 Minutes Intravenous  Once 05/06/21 1729 05/06/21 1829       Subjective: Patient was seen and examined at bedside.  Overnight events noted.   Patient reports feeling much improved.  He still c/o: dysuria, He was given pyridium yesterday, reports improvement.   Objective: Vitals:   05/08/21 1502 05/08/21 2100 05/09/21 0456 05/09/21 1400  BP: 122/67 140/83 (!) 145/81 131/77  Pulse: 63 67 61 (!) 57  Resp: 18 16 18 18   Temp: 98.2 F (36.8 C) 98.2 F (36.8 C) 97.7 F (36.5 C) 97.9 F (36.6 C)  TempSrc: Oral Oral Oral   SpO2: 96% 98% 100% 98%    Intake/Output Summary (Last 24 hours) at 05/09/2021 1457 Last data filed at 05/09/2021 1300 Gross per 24 hour  Intake 527.04 ml  Output --  Net 527.04 ml    There were no vitals filed for this visit.  Examination:  General exam: Appears calm and comfortable , not in any acute distress.  Respiratory system: Clear to auscultation. Respiratory effort normal. Cardiovascular system: S1 & S2 heard, RRR. No JVD, murmurs, rubs, gallops or clicks. No pedal edema. Gastrointestinal system: Abdomen is nondistended, soft and nontender. No organomegaly or masses felt. Normal bowel sounds heard. Central nervous system: Alert and oriented. No focal neurological deficits. Extremities:   No edema, no cyanosis, no clubbing. Skin: No rashes, lesions or ulcers Psychiatry: Judgement and insight appear normal. Mood & affect appropriate.     Data Reviewed: I have personally reviewed following labs and imaging studies  CBC: Recent Labs  Lab 05/05/21 0556 05/06/21 1747 05/07/21 0624 05/08/21 0454 05/09/21 0609  WBC 2.7* 4.4 3.6* 2.9* 2.6*  NEUTROABS  --  1.5*  --   --   --   HGB 10.6* 9.2* 9.3* 9.2* 9.2*  HCT 33.8* 28.5* 29.2* 29.0* 29.1*  MCV 83.0 80.1 80.4 81.2  81.7  PLT 198 176 166 147* 027    Basic Metabolic Panel: Recent Labs  Lab 05/05/21 0556 05/06/21 1747 05/07/21 0624 05/08/21 0454 05/09/21 0609  NA 142 137 140 138 139  K 3.7 3.4* 3.4* 3.2* 3.5  CL 108 106 105 105 104  CO2 26 25 26 24 28   GLUCOSE 182* 178* 192* 157* 170*  BUN 23 23 23  26* 25*  CREATININE 1.57* 1.84* 1.84* 1.80* 1.79*  CALCIUM 8.7* 8.3* 8.4* 8.4* 8.3*  MG  --   --   --  1.6* 1.8  PHOS  --   --   --  3.2 3.0    GFR: Estimated Creatinine Clearance: 46.4 mL/min (A) (by C-G formula based on SCr of 1.79 mg/dL (H)). Liver Function Tests: Recent Labs  Lab 05/06/21 1747  AST 18  ALT 17  ALKPHOS 55  BILITOT 0.7  PROT 5.3*  ALBUMIN 3.0*    No results for input(s): LIPASE, AMYLASE in the last 168 hours. No results for input(s): AMMONIA in the last 168 hours. Coagulation Profile: Recent Labs  Lab 05/06/21 1747  INR 2.0*    Cardiac Enzymes:  No results for input(s): CKTOTAL, CKMB, CKMBINDEX, TROPONINI in the last 168 hours. BNP (last 3 results) No results for input(s): PROBNP in the last 8760 hours. HbA1C: No results for input(s): HGBA1C in the last 72 hours. CBG: Recent Labs  Lab 05/08/21 1157 05/08/21 1712 05/08/21 2132 05/09/21 0747 05/09/21 1226  GLUCAP 205* 194* 223* 180* 259*    Lipid Profile: No results for input(s): CHOL, HDL, LDLCALC, TRIG, CHOLHDL, LDLDIRECT in the last 72 hours. Thyroid Function Tests: No results for input(s): TSH, T4TOTAL, FREET4, T3FREE, THYROIDAB in the last 72 hours. Anemia Panel: No results for input(s): VITAMINB12, FOLATE, FERRITIN, TIBC, IRON, RETICCTPCT in the last 72 hours. Sepsis Labs: Recent Labs  Lab 05/06/21 1747 05/06/21 2204 05/07/21 0624  PROCALCITON  --   --  0.50  LATICACIDVEN 1.1 1.0  --      Recent Results (from the past 240 hour(s))  Resp Panel by RT-PCR (Flu A&B, Covid) Nasopharyngeal Swab     Status: None   Collection Time: 04/30/21  2:58 PM   Specimen: Nasopharyngeal Swab;  Nasopharyngeal(NP) swabs in vial transport medium  Result Value Ref Range Status   SARS Coronavirus 2 by RT PCR NEGATIVE NEGATIVE Final    Comment: (NOTE) SARS-CoV-2 target nucleic acids are NOT DETECTED.  The SARS-CoV-2 RNA is generally detectable in upper respiratory specimens during the acute phase of infection. The lowest concentration of SARS-CoV-2 viral copies this assay can detect is 138 copies/mL. A negative result does not preclude SARS-Cov-2 infection and should not be used as the sole basis for treatment or other patient management decisions. A negative result may occur with  improper specimen collection/handling, submission of specimen other than nasopharyngeal swab, presence of viral mutation(s) within the areas targeted by this assay, and inadequate number of viral copies(<138 copies/mL). A negative result must be combined with clinical observations, patient history, and epidemiological information. The expected result is Negative.  Fact Sheet for Patients:  EntrepreneurPulse.com.au  Fact Sheet for Healthcare Providers:  IncredibleEmployment.be  This test is no t yet approved or cleared by the [place] FDA and  has been authorized for detection and/or diagnosis of SARS-CoV-2 by FDA under an Emergency Use Authorization (EUA). This EUA will remain  in effect (meaning this test can be used) for the duration of the COVID-19 declaration under Section 564(b)(1) of the Act, 21 U.S.C.section 360bbb-3(b)(1), unless the authorization is terminated  or revoked sooner.       Influenza A by PCR NEGATIVE NEGATIVE Final   Influenza B by PCR NEGATIVE NEGATIVE Final    Comment: (NOTE) The Xpert Xpress SARS-CoV-2/FLU/RSV plus assay is intended as an aid in the diagnosis of influenza from Nasopharyngeal swab specimens and should not be used as a sole basis for treatment. Nasal washings and aspirates are unacceptable for Xpert Xpress  SARS-CoV-2/FLU/RSV testing.  Fact Sheet for Patients: EntrepreneurPulse.com.au  Fact Sheet for Healthcare Providers: IncredibleEmployment.be  This test is not yet approved or cleared by the [place] FDA and has been authorized for detection and/or diagnosis of SARS-CoV-2 by FDA under an Emergency Use Authorization (EUA). This EUA will remain in effect (meaning this test can be used) for the duration of the COVID-19 declaration under Section 564(b)(1) of the Act, 21 U.S.C. section 360bbb-3(b)(1), unless the authorization is terminated or revoked.  Performed at [facility], [address], [place], [place] [address]   Culture, blood (routine x 2)     Status: None   Collection Time: 05/02/21 10:00 AM  Specimen: BLOOD  Result Value Ref Range Status   Specimen Description   Final    BLOOD LEFT ANTECUBITAL Performed at [facility] [address], [place], [place] [address]    Special Requests   Final    BOTTLES DRAWN AEROBIC ONLY Blood Culture adequate volume Performed at [facility] [address], [place], [place] [address]    Culture   Final    NO GROWTH 5 DAYS Performed at [facility], [place] [address], [place], [place] [address]    Report Status 05/07/2021 FINAL  Final  Culture, blood (routine x 2)     Status: None   Collection Time: 05/02/21 10:00 AM   Specimen: BLOOD  Result Value Ref Range Status   Specimen Description   Final    BLOOD BLOOD LEFT HAND Performed at [facility] [address] [place], [place] [address]    Special Requests   Final    BOTTLES DRAWN AEROBIC ONLY Blood Culture adequate volume Performed at [facility] [address] [place], [place] [address]    Culture   Final    NO GROWTH 5 DAYS Performed at [facility], [place] [address], [place], [place] [address]    Report Status 05/07/2021  FINAL  Final  Blood Culture (routine x 2)     Status: Abnormal   Collection Time: 05/06/21  5:48 PM   Specimen: BLOOD LEFT FOREARM  Result Value Ref Range Status   Specimen Description   Final    BLOOD LEFT FOREARM Performed at [facility] [address] [place], [place] [address]    Special Requests   Final    BOTTLES DRAWN AEROBIC AND ANAEROBIC Blood Culture adequate volume Performed at [facility] [address] [place], [place] [address]    Culture  Setup Time   Final    GRAM NEGATIVE RODS ANAEROBIC BOTTLE ONLY Organism ID to follow CRITICAL RESULT CALLED TO, READ BACK BY AND VERIFIED WITH: Colin Rhein PHARMD [id number] 05/07/21 A BROWNING Performed at [facility], [place] [address] [place], [place] [address]    Culture PSEUDOMONAS AERUGINOSA (A)  Final   Report Status 05/09/2021 FINAL  Final   Organism ID, Bacteria PSEUDOMONAS AERUGINOSA  Final      Susceptibility   Pseudomonas aeruginosa - MIC*    CEFTAZIDIME 4 SENSITIVE Sensitive     CIPROFLOXACIN <=0.25 SENSITIVE Sensitive     GENTAMICIN <=1 SENSITIVE Sensitive     IMIPENEM 2 SENSITIVE Sensitive     PIP/TAZO <=4 SENSITIVE Sensitive     CEFEPIME 2 SENSITIVE Sensitive     * PSEUDOMONAS AERUGINOSA  Blood Culture ID Panel (Reflexed)     Status: Abnormal   Collection Time: 05/06/21  5:48 PM  Result Value Ref Range Status   Enterococcus faecalis NOT DETECTED NOT DETECTED Final   Enterococcus Faecium NOT DETECTED NOT DETECTED Final   Listeria monocytogenes NOT DETECTED NOT DETECTED Final   Staphylococcus species NOT DETECTED NOT DETECTED Final   Staphylococcus aureus (BCID) NOT DETECTED NOT DETECTED Final   Staphylococcus epidermidis NOT DETECTED NOT DETECTED Final   Staphylococcus lugdunensis NOT DETECTED NOT DETECTED Final   Streptococcus species NOT DETECTED NOT DETECTED Final   Streptococcus agalactiae NOT DETECTED NOT DETECTED Final   Streptococcus pneumoniae NOT DETECTED NOT  DETECTED Final   Streptococcus pyogenes NOT DETECTED NOT DETECTED Final   A.calcoaceticus-baumannii NOT DETECTED NOT DETECTED Final  Bacteroides fragilis NOT DETECTED NOT DETECTED Final   Enterobacterales NOT DETECTED NOT DETECTED Final   Enterobacter cloacae complex NOT DETECTED NOT DETECTED Final   Escherichia coli NOT DETECTED NOT DETECTED Final   Klebsiella aerogenes NOT DETECTED NOT DETECTED Final   Klebsiella oxytoca NOT DETECTED NOT DETECTED Final   Klebsiella pneumoniae NOT DETECTED NOT DETECTED Final   Proteus species NOT DETECTED NOT DETECTED Final   Salmonella species NOT DETECTED NOT DETECTED Final   Serratia marcescens NOT DETECTED NOT DETECTED Final   Haemophilus influenzae NOT DETECTED NOT DETECTED Final   Neisseria meningitidis NOT DETECTED NOT DETECTED Final   Pseudomonas aeruginosa DETECTED (A) NOT DETECTED Final    Comment: CRITICAL RESULT CALLED TO, READ BACK BY AND VERIFIED WITH: Colin Rhein PHARMD [id number] 05/07/21 A BROWNING    Stenotrophomonas maltophilia NOT DETECTED NOT DETECTED Final   Candida albicans NOT DETECTED NOT DETECTED Final   Candida auris NOT DETECTED NOT DETECTED Final   Candida glabrata NOT DETECTED NOT DETECTED Final   Candida krusei NOT DETECTED NOT DETECTED Final   Candida parapsilosis NOT DETECTED NOT DETECTED Final   Candida tropicalis NOT DETECTED NOT DETECTED Final   Cryptococcus neoformans/gattii NOT DETECTED NOT DETECTED Final   CTX-M ESBL NOT DETECTED NOT DETECTED Final   Carbapenem resistance IMP NOT DETECTED NOT DETECTED Final   Carbapenem resistance KPC NOT DETECTED NOT DETECTED Final   Carbapenem resistance NDM NOT DETECTED NOT DETECTED Final   Carbapenem resistance VIM NOT DETECTED NOT DETECTED Final    Comment: Performed at [facility], [address] [place],  [address]  Resp Panel by RT-PCR (Flu A&B, Covid) Nasopharyngeal Swab     Status: None   Collection Time: 05/06/21  5:52 PM   Specimen: Nasopharyngeal Swab;  Nasopharyngeal(NP) swabs in vial transport medium  Result Value Ref Range Status   SARS Coronavirus 2 by RT PCR NEGATIVE NEGATIVE Final    Comment: (NOTE) SARS-CoV-2 target nucleic acids are NOT DETECTED.  The SARS-CoV-2 RNA is generally detectable in upper respiratory specimens during the acute phase of infection. The lowest concentration of SARS-CoV-2 viral copies this assay can detect is 138 copies/mL. A negative result does not preclude SARS-Cov-2 infection and should not be used as the sole basis for treatment or other patient management decisions. A negative result may occur with  improper specimen collection/handling, submission of specimen other than nasopharyngeal swab, presence of viral mutation(s) within the areas targeted by this assay, and inadequate number of viral copies(<138 copies/mL). A negative result must be combined with clinical observations, patient history, and epidemiological information. The expected result is Negative.  Fact Sheet for Patients:  EntrepreneurPulse.com.au  Fact Sheet for Healthcare Providers:  IncredibleEmployment.be  This test is no t yet approved or cleared by the [place] FDA and  has been authorized for detection and/or diagnosis of SARS-CoV-2 by FDA under an Emergency Use Authorization (EUA). This EUA will remain  in effect (meaning this test can be used) for the duration of the COVID-19 declaration under Section 564(b)(1) of the Act, 21 U.S.C.section 360bbb-3(b)(1), unless the authorization is terminated  or revoked sooner.       Influenza A by PCR NEGATIVE NEGATIVE Final   Influenza B by PCR NEGATIVE NEGATIVE Final    Comment: (NOTE) The Xpert Xpress SARS-CoV-2/FLU/RSV plus assay is intended as an aid in the diagnosis of influenza from Nasopharyngeal swab specimens and should not be used as a sole basis for treatment. Nasal washings and aspirates are unacceptable  for Xpert Xpress  SARS-CoV-2/FLU/RSV testing.  Fact Sheet for Patients: EntrepreneurPulse.com.au  Fact Sheet for Healthcare Providers: IncredibleEmployment.be  This test is not yet approved or cleared by the [place] FDA and has been authorized for detection and/or diagnosis of SARS-CoV-2 by FDA under an Emergency Use Authorization (EUA). This EUA will remain in effect (meaning this test can be used) for the duration of the COVID-19 declaration under Section 564(b)(1) of the Act, 21 U.S.C. section 360bbb-3(b)(1), unless the authorization is terminated or revoked.  Performed at [facility], [place] [address], [place], [place] [address]   Blood Culture (routine x 2)     Status: Abnormal (Preliminary result)   Collection Time: 05/06/21  6:02 PM   Specimen: BLOOD  Result Value Ref Range Status   Specimen Description   Final    BLOOD RIGHT ANTECUBITAL Performed at [facility] [address] [place], [place] [address]    Special Requests   Final    BOTTLES DRAWN AEROBIC AND ANAEROBIC Blood Culture adequate volume Performed at [facility] [address], [place], [place] [address]    Culture  Setup Time   Final    GRAM NEGATIVE RODS AEROBIC BOTTLE ONLY CRITICAL VALUE NOTED.  VALUE IS CONSISTENT WITH PREVIOUSLY REPORTED AND CALLED VALUE.    Culture (A)  Final    PSEUDOMONAS AERUGINOSA SUSCEPTIBILITIES PERFORMED ON PREVIOUS CULTURE WITHIN THE LAST 5 DAYS. Performed at [facility], [place] [address], [place], [place] [address]    Report Status PENDING  Incomplete  Urine culture     Status: Abnormal   Collection Time: 05/06/21  6:24 PM   Specimen: In/Out Cath Urine  Result Value Ref Range Status   Specimen Description   Final    IN/OUT CATH URINE Performed at [facility] [address], [place], [place] [address]    Special Requests   Final    NONE Performed at [facility], [place] [address], [place], [place] [address]    Culture 70,000 COLONIES/mL PSEUDOMONAS AERUGINOSA (A)  Final   Report Status 05/09/2021 FINAL  Final   Organism ID, Bacteria PSEUDOMONAS AERUGINOSA (A)  Final      Susceptibility   Pseudomonas aeruginosa - MIC*    CEFTAZIDIME 4 SENSITIVE Sensitive     CIPROFLOXACIN <=0.25 SENSITIVE Sensitive     GENTAMICIN <=1 SENSITIVE Sensitive     IMIPENEM 2 SENSITIVE Sensitive     PIP/TAZO 8 SENSITIVE Sensitive     CEFEPIME 2 SENSITIVE Sensitive     * 70,000 COLONIES/mL PSEUDOMONAS AERUGINOSA     Radiology Studies: No results found.   Scheduled Meds:  amiodarone  200 mg Oral Daily   amLODipine  10 mg Oral Daily   apixaban  5 mg Oral BID   atorvastatin  20 mg Oral QPM   clopidogrel  75 mg Oral Daily   insulin aspart  0-9 Units Subcutaneous TID WC   insulin glargine  25 Units Subcutaneous QHS   isosorbide mononitrate  30 mg Oral Daily   metoprolol tartrate  50 mg Oral BID   prednisoLONE acetate  1 drop Left Eye BID   sodium chloride flush  3 mL Intravenous Q12H   terazosin  2 mg Oral QHS   Continuous Infusions:  ceFEPime (MAXIPIME) IV 2 g (05/09/21 0656)     LOS: 1 day    Time spent: 25 mins    Shawna Clamp, MD [facility]   If  7PM-7AM, please contact night-coverage

## 2021-05-10 LAB — CULTURE, BLOOD (ROUTINE X 2): Special Requests: ADEQUATE

## 2021-05-10 LAB — GLUCOSE, CAPILLARY
Glucose-Capillary: 212 mg/dL — ABNORMAL HIGH (ref 70–99)
Glucose-Capillary: 269 mg/dL — ABNORMAL HIGH (ref 70–99)

## 2021-05-10 LAB — BASIC METABOLIC PANEL
Anion gap: 7 (ref 5–15)
BUN: 26 mg/dL — ABNORMAL HIGH (ref 8–23)
CO2: 28 mmol/L (ref 22–32)
Calcium: 8.3 mg/dL — ABNORMAL LOW (ref 8.9–10.3)
Chloride: 104 mmol/L (ref 98–111)
Creatinine, Ser: 1.77 mg/dL — ABNORMAL HIGH (ref 0.61–1.24)
GFR, Estimated: 40 mL/min — ABNORMAL LOW (ref >60)
Glucose, Bld: 220 mg/dL — ABNORMAL HIGH (ref 70–99)
Potassium: 3.9 mmol/L (ref 3.5–5.1)
Sodium: 139 mmol/L (ref 135–145)

## 2021-05-10 LAB — CBC
HCT: 29.4 % — ABNORMAL LOW (ref 39.0–52.0)
Hemoglobin: 9.3 g/dL — ABNORMAL LOW (ref 13.0–17.0)
MCH: 25.7 pg — ABNORMAL LOW (ref 26.0–34.0)
MCHC: 31.6 g/dL (ref 30.0–36.0)
MCV: 81.2 fL (ref 80.0–100.0)
Platelets: 175 10*3/uL (ref 150–400)
RBC: 3.62 MIL/uL — ABNORMAL LOW (ref 4.22–5.81)
RDW: 14.7 % (ref 11.5–15.5)
WBC: 2.6 10*3/uL — ABNORMAL LOW (ref 4.0–10.5)
nRBC: 0 % (ref 0.0–0.2)

## 2021-05-10 MED ORDER — CIPROFLOXACIN HCL 500 MG PO TABS: 500.0000 mg | ORAL_TABLET | Freq: Two times a day (BID) | ORAL | 0 refills | Status: AC

## 2021-05-10 MED ORDER — CIPROFLOXACIN HCL 500 MG PO TABS: 500.0000 mg | ORAL_TABLET | Freq: Two times a day (BID) | ORAL | 0 refills | Status: DC

## 2021-05-10 NOTE — Discharge Summary (Signed)
Physician Discharge Summary  [name]:[id number] DOB: [birth year] DOA: 05/06/2021  PCP: [name], MD  Admit date: 05/06/2021  Discharge date: 05/10/2021  Admitted From: Home.  Disposition:  Home.  Recommendations for Outpatient Follow-up:  Follow up with PCP in 1-2 weeks. Please obtain BMP/CBC in one week. Advised to take ciprofloxacin 500 mg twice daily for 5 more days to complete total 10-day treatment for Pseudomonas UTI/bacteremia. Advised to follow-up with Dr. [name] urologist for further evaluation.  Home Health:None. Equipment/Devices:None.  Discharge Condition: Good CODE STATUS:Full code Diet recommendation: Heart Healthy   Brief Summary: This 75 years old male with PMH significant for CAD s/p PCI with DES to RCA 11/2020, PAF on Eliquis and amiodarone, NSVT, CKD stage IIIa, IDT2DM, HTN, HLD, OSA on CPAP, and recent admission for right hydronephrosis due to obstructing mass who presents to the ED for evaluation of fevers and dysuria.  Patient was recently admitted from 04/30/2021 - 05/05/2021 for AKI on CKD secondary to right hydronephrosis from obstructing mass.  He underwent right ureteral stent placement on 05/01/2021 by urology, Dr. Junious Silk.  Foley catheter was placed postoperatively and removed on day of discharge.  He was also found to have pathologic abdominal lymphadenopathy and pelvic masses.  He underwent IR guided biopsy of the right infrarenal retroperitoneal mass on 6/20.  Pathology results pending and he was scheduled to follow with urology and oncology as an outpatient. Patient presented with increased urinary frequency, dysuria and suprapubic discomfort and straining to urinate, denies any hematuria.  He is found to have fever. UA consistent with UTI. Hospital Course: Patient was admitted for complicated UTI,  started on empiric IV antibiotics.  Urine cultures grew Pseudomonas,  blood cultures 1/2 bottles positive for Pseudomonas, patient  was continued on cefepime until sensitivity is pending,  renal ultrasound shows improving hydronephrosis, patient was started on Pyridium which helped with the dysuria.  Patient was continued on IV hydration.  Pseudomonas was sensitive to ciprofloxacin.  Patient has completed cefepime for 5 days.  Urology was consulted, recommended to continue antibiotics and follow-up outpatient with Dr. [name].  Patient is being discharged home on ciprofloxacin 500 mg twice daily for 5 more days to complete 10-day treatment for Pseudomonas UTI and bacteremia.  He was managed for below problems.  Discharge Diagnoses:  Principal Problem:   Complicated UTI (urinary tract infection) Active Problems:   Acute kidney injury superimposed on CKD (HCC)   CAD (coronary artery disease)   DM2 (diabetes mellitus, type 2) (HCC)   PAF (paroxysmal atrial fibrillation) ([place])   Hypertension associated with diabetes ([place])   Sleep apnea   Hyperlipidemia associated with type 2 diabetes mellitus ([place])  Complicated UTI : Patient presented with urinary symptoms, increased urinary frequency, straining while urination, dysuria and fever at home. He recently hospitalized and underwent ureteral stenting and temporary Foley catheter placement. Right hydronephrosis due to obstructing mass, s/p right ureteral stent placement 05/01/21: Continue empiric cefepime Blood cultures grew Pseudomonas, continue IV cefepime.  Renal ultrasound shows improving hydronephrosis. Continue gentle IV hydration. Monitor strict I/O's Urology consulted, recommended to continue antibiotics and follow-up Dr. [name].   AKI on CKD stage IIIa: > Improving Continue IV fluids , avoid nephrotoxic medications. Continue to monitor serum creatinine.   Pelvic masses/abdominal lymphadenopathy: Pathologic mesenteric, retroperitoneal, and pelvic adenopathy seen on CT A/P on 04/30/2021.   S/p IR guided biopsy of right infrarenal retroperitoneal mass. pathology  results pending, follow-up with oncology   Paroxysmal atrial fibrillation: Remains in sinus rhythm .  Continue home Lopressor, amiodarone, and Eliquis.   CAD s/p PCI with DES to RCA 11/2020: Denies any chest pain.  Continue Lopressor, Imdur, Eliquis, Plavix, atorvastatin.   Insulin-dependent type 2 diabetes: Continue reduced home Lantus 25 units nightly, SSI.   Hold home glipizide and metformin.   Hypertension: Continue amlodipine, Lopressor, Imdur.   OSA: Continue CPAP nightly.    Discharge Instructions  Discharge Instructions     Call MD for:  persistant dizziness or light-headedness   Complete by: As directed    Call MD for:  persistant nausea and vomiting   Complete by: As directed    Call MD for:  severe uncontrolled pain   Complete by: As directed    Diet - low sodium heart healthy   Complete by: As directed    Diet Carb Modified   Complete by: As directed    Discharge instructions   Complete by: As directed    Advised to follow-up with primary care physician in 1 week. Advised to take ciprofloxacin 500 mg twice daily for 3 more days to complete total 10-day treatment for Pseudomonas UTI/bacteremia. Advised to follow-up with Dr. [name] urologist for further evaluation.   Increase activity slowly   Complete by: As directed       Allergies as of 05/10/2021   No Known Allergies      Medication List     STOP taking these medications    glipiZIDE 10 MG tablet Commonly known as: GLUCOTROL   hydrochlorothiazide 25 MG tablet Commonly known as: HYDRODIURIL   lisinopril 20 MG tablet Commonly known as: ZESTRIL   metFORMIN 1000 MG tablet Commonly known as: GLUCOPHAGE       TAKE these medications    acetaminophen 325 MG tablet Commonly known as: TYLENOL Take 650 mg by mouth every 6 (six) hours as needed for fever.   amiodarone 200 MG tablet Commonly known as: PACERONE Take 200 mg by mouth daily.   amLODipine 10 MG tablet Commonly known as:  NORVASC Take 1 tablet (10 mg total) by mouth daily.   atorvastatin 20 MG tablet Commonly known as: LIPITOR Take 20 mg by mouth every evening.   ciprofloxacin 500 MG tablet Commonly known as: Cipro Take 1 tablet (500 mg total) by mouth 2 (two) times daily for 5 days.   clopidogrel 75 MG tablet Commonly known as: PLAVIX Take 75 mg by mouth daily.   Eliquis 5 MG Tabs tablet Generic drug: apixaban Take 5 mg by mouth 2 (two) times daily.   isosorbide mononitrate 30 MG 24 hr tablet Commonly known as: IMDUR Take 30 mg by mouth daily.   Lantus SoloStar 100 UNIT/ML Solostar Pen Generic drug: insulin glargine Inject 34 Units into the skin every evening.   metoprolol tartrate 50 MG tablet Commonly known as: LOPRESSOR Take 1 tablet (50 mg total) by mouth 2 (two) times daily.   nitroGLYCERIN 0.3 MG SL tablet Commonly known as: NITROSTAT Place 0.3 mg under the tongue every 5 (five) minutes x 3 doses as needed for chest pain.   terazosin 2 MG capsule Commonly known as: HYTRIN Take 2 mg by mouth at bedtime.        Follow-up Information     Radiontchenko, Alexei, MD Follow up in 1 week(s).   Specialty: Family Medicine Contact information: [address] [place] [address] [phone]         [name], MD Follow up in 1 week(s).   Specialty: Urology Contact information: [address]  [place] [address] [phone]                No Known Allergies  Consultations: Urology   Procedures/Studies: CT Abdomen Pelvis Wo Contrast  Result Date: 04/30/2021 CLINICAL DATA:  Right lower quadrant abdominal pain EXAM: CT ABDOMEN AND PELVIS WITHOUT CONTRAST TECHNIQUE: Multidetector CT imaging of the abdomen and pelvis was performed following the standard protocol without IV contrast. COMPARISON:  None. FINDINGS: Lower chest: 6 mm mean diameter noncalcified pulmonary nodule is seen within the visualized right middle lobe, indeterminate. Scattered  ground-glass pulmonary infiltrates in the noted at the lung bases bilaterally with subtle, smooth interlobular septal thickening possibly representing changes of trace pulmonary edema, possibly cardiogenic in nature. Extensive multi-vessel coronary artery calcification is present with probable stenting of the distal right coronary artery. Global cardiac size within normal limits. No pericardial effusion. Hypoattenuation of the cardiac blood pool is in keeping with at least mild anemia. Hepatobiliary: No focal liver abnormality is seen. No gallstones, gallbladder wall thickening, or biliary dilatation. Pancreas: Unremarkable Spleen: There is moderate splenomegaly with the spleen measuring 17.2 cm in greatest dimension. No definite intrasplenic lesion identified on this noncontrast examination. Adrenals/Urinary Tract: The adrenal glands are unremarkable. The kidneys are normal in size and position. There is mild right hydronephrosis secondary to an obstructing lobulated mass in the region of the right ureteropelvic junction measuring 3.1 x 4.5 x 3.8 cm on axial image # 58 and coronal image # 67 while difficult to accurately characterize in the absence of contrast administration, the mass appears external to the right ureter and demonstrates occlusion of the a proximal right ureter likely related to extrinsic mass effect, best appreciated on coronal imaging. Mild bilateral nonspecific perinephric stranding. No intrarenal or ureteral calculi are identified no hydronephrosis on the left. Appearing calcifications along the posterior wall of the bladder in the region of the ureteropelvic junction may represent posterior layering calculi within the bladder lumen or may be artifactual and related to streak artifact from right total hip arthroplasty. Stomach/Bowel: The stomach, small bowel, and large bowel are unremarkable. No evidence of obstruction or focal inflammation. The appendix is normal. No free intraperitoneal gas  or fluid. Vascular/Lymphatic: There is extensive mesenteric infiltration. Additionally, there is pathologic mesenteric adenopathy with the dominant mass measuring 6.7 x 4.1 cm at axial image # 44/2. Multiple additional pathologically enlarged mesenteric lymph nodes are identified demonstrating irregular margins. There is, additionally, pathologic bilateral common iliac and external iliac lymphadenopathy as well as right pelvic sidewall lymphadenopathy with the index right common iliac lymph node measuring 1.8 x 3.7 cm at axial image # 72/2. Borderline pathologic aortocaval and left periaortic lymphadenopathy is present. There is moderate aortoiliac atherosclerotic calcification. No aortic aneurysm. Reproductive: Mild prostatic enlargement. Seminal vesicles are unremarkable. Other: Soft tissue nodule within the right retroperitoneum adjacent to the right psoas muscle may represent pathologic retroperitoneal adenopathy or a metastatic implant. This measures 1.7 x 3.0 cm at axial image # 63/2. 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 Degenerative changes are seen within the lumbar spine. No lytic or blastic bone lesions are identified. IMPRESSION: Pathologic mesenteric, retroperitoneal, and pelvic adenopathy, as described above. Moderate splenomegaly. Lobulated retroperitoneal soft tissue masses in the region of the right ureteropelvic junction likely resulting in mild right hydronephrosis secondary to extrinsic mass effect as well as within the right retroperitoneum. 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 Less likely, this may represent metastatic disease secondary to a primary urothelial malignancy or  potentially metastatic disease related to underlying prostate cancer, however, the nodal distribution would be unusual for the former and the lack of osseous metastatic disease given the extensive nodal involvement would be unusual for the latter. PET CT examination may be  more helpful for further evaluation as well as identification of a a optimal tissue sampling target. Pathologic right external iliac lymphadenopathy may be amenable to ultrasound-guided sampling for definitive diagnosis. 6 mm of right middle lobe pulmonary nodule. Non-contrast chest CT at 6-12 months is recommended. If the nodule is stable at time of repeat CT, then future CT at 18-24 months (from today's scan) is considered optional for low-risk patients, but is recommended for high-risk patients. This recommendation follows the consensus statement: Guidelines for Management of Incidental Pulmonary Nodules Detected on CT Images: From the Fleischner Society 2017; Radiology 2017; 284:228-243. Extensive coronary artery calcification. Scattered ground-glass pulmonary infiltrates and subtle smooth interlobular septal thickening may reflect changes of mild cardiogenic failure. At least mild anemia. Aortic Atherosclerosis (ICD10-I70.0). Electronically Signed   By: Fidela Salisbury MD   On: 04/30/2021 14:38   US RENAL  Result Date: 05/06/2021 CLINICAL DATA:  Acute on chronic kidney disease EXAM: RENAL / URINARY TRACT ULTRASOUND COMPLETE COMPARISON:  CT 04/30/2021 FINDINGS: Right Kidney: Renal measurements: 11.8 x 7.5 x 5.6 cm = volume: 259.6 mL. Echogenicity within normal limits. Mild right hydronephrosis which is likely improved as compared with recent CT. 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 Left Kidney: Renal measurements: 12.3 x 6 x 5.4 cm = volume: 7.7 mL. Small cyst at the midpole measuring 12 x 14 x 11 mm additional small cyst at the midpole measuring 12 x 9 x 15 mm. Bladder: Appears normal for degree of bladder distention. Other: None. IMPRESSION: 1. Mild right hydronephrosis probably improved as compared with prior CT. Right UPJ region mass on CT is not well demonstrated on sonogram today. 2. Small left renal cysts.  No left hydronephrosis Electronically Signed   By: Donavan Foil M.D.    On: 05/06/2021 21:28   DG Chest Port 1 View  Result Date: 05/06/2021 CLINICAL DATA:  75 year old male with concern for sepsis. EXAM: PORTABLE CHEST 1 VIEW COMPARISON:  None. FINDINGS: No focal consolidation, pleural effusion, or pneumothorax. Borderline cardiomegaly. Atherosclerotic calcification of the aorta. Bilateral hilar prominence, likely dilatation of the main pulmonary arteries and suggestive of pulmonary hypertension. Adenopathy is less likely but not excluded. Attention on follow-up imaging recommended. IMPRESSION: 1. No acute cardiopulmonary process. 2. Borderline cardiomegaly with findings suggestive of pulmonary hypertension. Electronically Signed   By: Anner Crete M.D.   On: 05/06/2021 18:27   DG C-Arm 1-60 Min-No Report  Result Date: 05/01/2021 Fluoroscopy was utilized by the requesting physician.  No radiographic interpretation.   CT RENAL BIOPSY  Result Date: 05/04/2021 INDICATION: 75 year old male with right infrarenal retroperitoneal mass and abdominal lymphadenopathy of indeterminate etiology. EXAM: CT BIOPSY CORE RENAL COMPARISON:  05/30/2021 MEDICATIONS: None. ANESTHESIA/SEDATION: Fentanyl 100 mcg IV; Versed 2 mg IV Sedation time: 13 minutes; The patient was continuously monitored during the procedure by the interventional radiology nurse under my direct supervision. CONTRAST:  None. COMPLICATIONS: None immediate. PROCEDURE: Informed consent was obtained from the patient following an explanation of the procedure, risks, benefits and alternatives. A time out was performed prior to the initiation of the procedure. The patient was positioned prone on the CT table and a limited CT was performed for procedural planning demonstrating similar appearing soft tissue mass about the inferior aspect  of the right kidney which appears to surround the proximal right ureter. The procedure was planned. The operative site was prepped and draped in the usual sterile fashion. Appropriate  trajectory was confirmed with a 22 gauge spinal needle after the adjacent tissues were anesthetized with 1% Lidocaine with epinephrine. Under intermittent CT guidance, a 17 gauge coaxial needle was advanced into the peripheral aspect of the mass. Appropriate positioning was confirmed and total of 3 samples were obtained with an 18 gauge core needle biopsy device. The co-axial needle was removed while injecting Gel-Foam slurry and hemostasis was achieved with manual compression. A limited postprocedural CT was negative for hemorrhage or additional complication. A dressing was placed. The patient tolerated the procedure well without immediate postprocedural complication. IMPRESSION: Technically successful CT guided core needle biopsy of right infrarenal retroperitoneal mass. Ruthann Cancer, MD [facility] Electronically Signed   By: Ruthann Cancer MD   On: 05/04/2021 14:23    Subjective: Patient was seen and examined at bedside.  Overnight events noted.   Patient reports feeling much improved and wants to be discharged.  Patient is being discharged home. [place] interpreter : Viktoria ID [phone]  Discharge Exam: Vitals:   05/09/21 2022 05/10/21 0506  BP: 135/72 139/81  Pulse: 68 (!) 58  Resp: 20 16  Temp: 98.7 F (37.1 C) 97.9 F (36.6 C)  SpO2: 98% 99%   Vitals:   05/09/21 0456 05/09/21 1400 05/09/21 2022 05/10/21 0506  BP: (!) 145/81 131/77 135/72 139/81  Pulse: 61 (!) 57 68 (!) 58  Resp: 18 18 20 16   Temp: 97.7 F (36.5 C) 97.9 F (36.6 C) 98.7 F (37.1 C) 97.9 F (36.6 C)  TempSrc: Oral  Oral Oral  SpO2: 100% 98% 98% 99%    General: Pt is alert, awake, not in acute distress Cardiovascular: RRR, S1/S2 +, no rubs, no gallops Respiratory: CTA bilaterally, no wheezing, no rhonchi Abdominal: Soft, NT, ND, bowel sounds + Extremities: no edema, no cyanosis    The results of significant diagnostics from this hospitalization  (including imaging, microbiology, ancillary and laboratory) are listed below for reference.     Microbiology: Recent Results (from the past 240 hour(s))  Resp Panel by RT-PCR (Flu A&B, Covid) Nasopharyngeal Swab     Status: None   Collection Time: 04/30/21  2:58 PM   Specimen: Nasopharyngeal Swab; Nasopharyngeal(NP) swabs in vial transport medium  Result Value Ref Range Status   SARS Coronavirus 2 by RT PCR NEGATIVE NEGATIVE Final    Comment: (NOTE) SARS-CoV-2 target nucleic acids are NOT DETECTED.  The SARS-CoV-2 RNA is generally detectable in upper respiratory specimens during the acute phase of infection. The lowest concentration of SARS-CoV-2 viral copies this assay can detect is 138 copies/mL. A negative result does not preclude SARS-Cov-2 infection and should not be used as the sole basis for treatment or other patient management decisions. A negative result may occur with  improper specimen collection/handling, submission of specimen other than nasopharyngeal swab, presence of viral mutation(s) within the areas targeted by this assay, and inadequate number of viral copies(<138 copies/mL). A negative result must be combined with clinical observations, patient history, and epidemiological information. The expected result is Negative.  Fact Sheet for Patients:  EntrepreneurPulse.com.au  Fact Sheet for Healthcare Providers:  IncredibleEmployment.be  This test is no t yet approved or cleared by the [place] FDA and  has been authorized for detection and/or diagnosis of SARS-CoV-2 by FDA under an Emergency Use Authorization (  EUA). This EUA will remain  in effect (meaning this test can be used) for the duration of the COVID-19 declaration under Section 564(b)(1) of the Act, 21 U.S.C.section 360bbb-3(b)(1), unless the authorization is terminated  or revoked sooner.       Influenza A by PCR NEGATIVE NEGATIVE Final   Influenza B by PCR  NEGATIVE NEGATIVE Final    Comment: (NOTE) The Xpert Xpress SARS-CoV-2/FLU/RSV plus assay is intended as an aid in the diagnosis of influenza from Nasopharyngeal swab specimens and should not be used as a sole basis for treatment. Nasal washings and aspirates are unacceptable for Xpert Xpress SARS-CoV-2/FLU/RSV testing.  Fact Sheet for Patients: EntrepreneurPulse.com.au  Fact Sheet for Healthcare Providers: IncredibleEmployment.be  This test is not yet approved or cleared by the [place] FDA and has been authorized for detection and/or diagnosis of SARS-CoV-2 by FDA under an Emergency Use Authorization (EUA). This EUA will remain in effect (meaning this test can be used) for the duration of the COVID-19 declaration under Section 564(b)(1) of the Act, 21 U.S.C. section 360bbb-3(b)(1), unless the authorization is terminated or revoked.  Performed at [facility], [address], [place], [place] [address]   Culture, blood (routine x 2)     Status: None   Collection Time: 05/02/21 10:00 AM   Specimen: BLOOD  Result Value Ref Range Status   Specimen Description   Final    BLOOD LEFT ANTECUBITAL Performed at [place] [facility] [address] [place], [place] [address]    Special Requests   Final    BOTTLES DRAWN AEROBIC ONLY Blood Culture adequate volume Performed at [facility] [address] [place], [place] [address]    Culture   Final    NO GROWTH 5 DAYS Performed at [facility], [place] [address] [place], [place] [address]    Report Status 05/07/2021 FINAL  Final  Culture, blood (routine x 2)     Status: None   Collection Time: 05/02/21 10:00 AM   Specimen: BLOOD  Result Value Ref Range Status   Specimen Description   Final    BLOOD BLOOD LEFT HAND Performed at [facility] [address], [place], [place] [address]    Special Requests   Final     BOTTLES DRAWN AEROBIC ONLY Blood Culture adequate volume Performed at [facility] [address], [place], [place] [address]    Culture   Final    NO GROWTH 5 DAYS Performed at [facility], [place] [address], [place], [place] [address]    Report Status 05/07/2021 FINAL  Final  Blood Culture (routine x 2)     Status: Abnormal   Collection Time: 05/06/21  5:48 PM   Specimen: BLOOD LEFT FOREARM  Result Value Ref Range Status   Specimen Description   Final    BLOOD LEFT FOREARM Performed at [facility] [address], [place], [place] [address]    Special Requests   Final    BOTTLES DRAWN AEROBIC AND ANAEROBIC Blood Culture adequate volume Performed at [facility] [address], [place], [place] [address]    Culture  Setup Time   Final    GRAM NEGATIVE RODS ANAEROBIC BOTTLE ONLY Organism ID to follow CRITICAL RESULT CALLED TO, READ BACK BY AND VERIFIED WITH: Colin Rhein PHARMD [id number] 05/07/21 A BROWNING Performed at [facility], [place] [address] [place], [place] [address]    Culture  PSEUDOMONAS AERUGINOSA (A)  Final   Report Status 05/09/2021 FINAL  Final   Organism ID, Bacteria PSEUDOMONAS AERUGINOSA  Final      Susceptibility   Pseudomonas aeruginosa - MIC*    CEFTAZIDIME 4 SENSITIVE Sensitive     CIPROFLOXACIN <=0.25 SENSITIVE Sensitive     GENTAMICIN <=1 SENSITIVE Sensitive     IMIPENEM 2 SENSITIVE Sensitive     PIP/TAZO <=4 SENSITIVE Sensitive     CEFEPIME 2 SENSITIVE Sensitive     * PSEUDOMONAS AERUGINOSA  Blood Culture ID Panel (Reflexed)     Status: Abnormal   Collection Time: 05/06/21  5:48 PM  Result Value Ref Range Status   Enterococcus faecalis NOT DETECTED NOT DETECTED Final   Enterococcus Faecium NOT DETECTED NOT DETECTED Final   Listeria monocytogenes NOT DETECTED NOT DETECTED Final   Staphylococcus species NOT DETECTED NOT DETECTED Final   Staphylococcus aureus (BCID) NOT DETECTED NOT DETECTED  Final   Staphylococcus epidermidis NOT DETECTED NOT DETECTED Final   Staphylococcus lugdunensis NOT DETECTED NOT DETECTED Final   Streptococcus species NOT DETECTED NOT DETECTED Final   Streptococcus agalactiae NOT DETECTED NOT DETECTED Final   Streptococcus pneumoniae NOT DETECTED NOT DETECTED Final   Streptococcus pyogenes NOT DETECTED NOT DETECTED Final   A.calcoaceticus-baumannii NOT DETECTED NOT DETECTED Final   Bacteroides fragilis NOT DETECTED NOT DETECTED Final   Enterobacterales NOT DETECTED NOT DETECTED Final   Enterobacter cloacae complex NOT DETECTED NOT DETECTED Final   Escherichia coli NOT DETECTED NOT DETECTED Final   Klebsiella aerogenes NOT DETECTED NOT DETECTED Final   Klebsiella oxytoca NOT DETECTED NOT DETECTED Final   Klebsiella pneumoniae NOT DETECTED NOT DETECTED Final   Proteus species NOT DETECTED NOT DETECTED Final   Salmonella species NOT DETECTED NOT DETECTED Final   Serratia marcescens NOT DETECTED NOT DETECTED Final   Haemophilus influenzae NOT DETECTED NOT DETECTED Final   Neisseria meningitidis NOT DETECTED NOT DETECTED Final   Pseudomonas aeruginosa DETECTED (A) NOT DETECTED Final    Comment: CRITICAL RESULT CALLED TO, READ BACK BY AND VERIFIED WITH: Colin Rhein PHARMD 1753 05/07/21 A BROWNING    Stenotrophomonas maltophilia NOT DETECTED NOT DETECTED Final   Candida albicans NOT DETECTED NOT DETECTED Final   Candida auris NOT DETECTED NOT DETECTED Final   Candida glabrata NOT DETECTED NOT DETECTED Final   Candida krusei NOT DETECTED NOT DETECTED Final   Candida parapsilosis NOT DETECTED NOT DETECTED Final   Candida tropicalis NOT DETECTED NOT DETECTED Final   Cryptococcus neoformans/gattii NOT DETECTED NOT DETECTED Final   CTX-M ESBL NOT DETECTED NOT DETECTED Final   Carbapenem resistance IMP NOT DETECTED NOT DETECTED Final   Carbapenem resistance KPC NOT DETECTED NOT DETECTED Final   Carbapenem resistance NDM NOT DETECTED NOT DETECTED Final   Carbapenem  resistance VIM NOT DETECTED NOT DETECTED Final    Comment: Performed at [facility], [address] [place], [place] [address]  Resp Panel by RT-PCR (Flu A&B, Covid) Nasopharyngeal Swab     Status: None   Collection Time: 05/06/21  5:52 PM   Specimen: Nasopharyngeal Swab; Nasopharyngeal(NP) swabs in vial transport medium  Result Value Ref Range Status   SARS Coronavirus 2 by RT PCR NEGATIVE NEGATIVE Final    Comment: (NOTE) SARS-CoV-2 target nucleic acids are NOT DETECTED.  The SARS-CoV-2 RNA is generally detectable in upper respiratory specimens during the acute phase of infection. The lowest concentration of SARS-CoV-2 viral copies this assay can detect is 138 copies/mL. A negative result does  not preclude SARS-Cov-2 infection and should not be used as the sole basis for treatment or other patient management decisions. A negative result may occur with  improper specimen collection/handling, submission of specimen other than nasopharyngeal swab, presence of viral mutation(s) within the areas targeted by this assay, and inadequate number of viral copies(<138 copies/mL). A negative result must be combined with clinical observations, patient history, and epidemiological information. The expected result is Negative.  Fact Sheet for Patients:  EntrepreneurPulse.com.au  Fact Sheet for Healthcare Providers:  IncredibleEmployment.be  This test is no t yet approved or cleared by the [place] FDA and  has been authorized for detection and/or diagnosis of SARS-CoV-2 by FDA under an Emergency Use Authorization (EUA). This EUA will remain  in effect (meaning this test can be used) for the duration of the COVID-19 declaration under Section 564(b)(1) of the Act, 21 U.S.C.section 360bbb-3(b)(1), unless the authorization is terminated  or revoked sooner.       Influenza A by PCR NEGATIVE NEGATIVE Final   Influenza B by PCR NEGATIVE NEGATIVE  Final    Comment: (NOTE) The Xpert Xpress SARS-CoV-2/FLU/RSV plus assay is intended as an aid in the diagnosis of influenza from Nasopharyngeal swab specimens and should not be used as a sole basis for treatment. Nasal washings and aspirates are unacceptable for Xpert Xpress SARS-CoV-2/FLU/RSV testing.  Fact Sheet for Patients: EntrepreneurPulse.com.au  Fact Sheet for Healthcare Providers: IncredibleEmployment.be  This test is not yet approved or cleared by the [place] FDA and has been authorized for detection and/or diagnosis of SARS-CoV-2 by FDA under an Emergency Use Authorization (EUA). This EUA will remain in effect (meaning this test can be used) for the duration of the COVID-19 declaration under Section 564(b)(1) of the Act, 21 U.S.C. section 360bbb-3(b)(1), unless the authorization is terminated or revoked.  Performed at [facility], [place] [address], [place], [place] [address]   Blood Culture (routine x 2)     Status: Abnormal   Collection Time: 05/06/21  6:02 PM   Specimen: BLOOD  Result Value Ref Range Status   Specimen Description   Final    BLOOD RIGHT ANTECUBITAL Performed at [facility] [address], [place], [place] [address]    Special Requests   Final    BOTTLES DRAWN AEROBIC AND ANAEROBIC Blood Culture adequate volume Performed at [facility] [address] [place], [place] [address]    Culture  Setup Time   Final    GRAM NEGATIVE RODS AEROBIC BOTTLE ONLY CRITICAL VALUE NOTED.  VALUE IS CONSISTENT WITH PREVIOUSLY REPORTED AND CALLED VALUE.    Culture (A)  Final    PSEUDOMONAS AERUGINOSA SUSCEPTIBILITIES PERFORMED ON PREVIOUS CULTURE WITHIN THE LAST 5 DAYS. Performed at [facility], [place] [address], [place], [place] [address]    Report Status 05/10/2021 FINAL  Final  Urine culture     Status: Abnormal   Collection Time: 05/06/21  6:24 PM    Specimen: In/Out Cath Urine  Result Value Ref Range Status   Specimen Description   Final    IN/OUT CATH URINE Performed at [facility] [address] [place], [place] [address]    Special Requests   Final    NONE Performed at [facility], [place] [address] [place], [place] [address]    Culture 70,000 COLONIES/mL PSEUDOMONAS AERUGINOSA (A)  Final   Report Status 05/09/2021 FINAL  Final   Organism ID, Bacteria PSEUDOMONAS AERUGINOSA (A)  Final      Susceptibility   Pseudomonas aeruginosa - MIC*    CEFTAZIDIME 4 SENSITIVE Sensitive     CIPROFLOXACIN <=0.25 SENSITIVE Sensitive     GENTAMICIN <=1 SENSITIVE Sensitive     IMIPENEM 2 SENSITIVE Sensitive     PIP/TAZO 8 SENSITIVE Sensitive     CEFEPIME 2 SENSITIVE Sensitive     * 70,000 COLONIES/mL PSEUDOMONAS AERUGINOSA     Labs: BNP (last 3 results) No results for input(s): BNP in the last 8760 hours. Basic Metabolic Panel: Recent Labs  Lab 05/06/21 1747 05/07/21 0624 05/08/21 0454 05/09/21 0609 05/10/21 0529  NA 137 140 138 139 139  K 3.4* 3.4* 3.2* 3.5 3.9  CL 106 105 105 104 104  CO2 25 26 24 28 28   GLUCOSE 178* 192* 157* 170* 220*  BUN 23 23 26* 25* 26*  CREATININE 1.84* 1.84* 1.80* 1.79* 1.77*  CALCIUM 8.3* 8.4* 8.4* 8.3* 8.3*  MG  --   --  1.6* 1.8  --   PHOS  --   --  3.2 3.0  --    Liver Function Tests: Recent Labs  Lab 05/06/21 1747  AST 18  ALT 17  ALKPHOS 55  BILITOT 0.7  PROT 5.3*  ALBUMIN 3.0*   No results for input(s): LIPASE, AMYLASE in the last 168 hours. No results for input(s): AMMONIA in the last 168 hours. CBC: Recent Labs  Lab 05/06/21 1747 05/07/21 0624 05/08/21 0454 05/09/21 0609 05/10/21 0529  WBC 4.4 3.6* 2.9* 2.6* 2.6*  NEUTROABS 1.5*  --   --   --   --   HGB 9.2* 9.3* 9.2* 9.2* 9.3*  HCT 28.5* 29.2* 29.0* 29.1* 29.4*  MCV 80.1 80.4 81.2 81.7 81.2  PLT 176 166 147* 171 175   Cardiac Enzymes: No results for input(s): CKTOTAL, CKMB,  CKMBINDEX, TROPONINI in the last 168 hours. BNP: Invalid input(s): POCBNP CBG: Recent Labs  Lab 05/09/21 0747 05/09/21 1226 05/09/21 1711 05/09/21 2018 05/10/21 0749  GLUCAP 180* 259* 197* 273* 212*   D-Dimer No results for input(s): DDIMER in the last 72 hours. Hgb A1c No results for input(s): HGBA1C in the last 72 hours. Lipid Profile No results for input(s): CHOL, HDL, LDLCALC, TRIG, CHOLHDL, LDLDIRECT in the last 72 hours. Thyroid function studies No results for input(s): TSH, T4TOTAL, T3FREE, THYROIDAB in the last 72 hours.  Invalid input(s): FREET3 Anemia work up No results for input(s): VITAMINB12, FOLATE, FERRITIN, TIBC, IRON, RETICCTPCT in the last 72 hours. Urinalysis    Component Value Date/Time   COLORURINE YELLOW 05/06/2021 1824   APPEARANCEUR HAZY (A) 05/06/2021 1824   LABSPEC 1.008 05/06/2021 1824   PHURINE 5.0 05/06/2021 1824   GLUCOSEU 50 (A) 05/06/2021 1824   HGBUR LARGE (A) 05/06/2021 1824   BILIRUBINUR NEGATIVE 05/06/2021 1824   KETONESUR NEGATIVE 05/06/2021 1824   PROTEINUR 30 (A) 05/06/2021 1824   NITRITE NEGATIVE 05/06/2021 1824   LEUKOCYTESUR TRACE (A) 05/06/2021 1824   Sepsis Labs Invalid input(s): PROCALCITONIN,  WBC,  LACTICIDVEN Microbiology Recent Results (from the past 240 hour(s))  Resp Panel by RT-PCR (Flu A&B, Covid) Nasopharyngeal Swab     Status: None   Collection Time: 04/30/21  2:58 PM   Specimen: Nasopharyngeal Swab; Nasopharyngeal(NP) swabs in vial transport medium  Result Value Ref Range Status   SARS Coronavirus 2 by RT PCR NEGATIVE NEGATIVE Final    Comment: (NOTE) SARS-CoV-2 target nucleic acids are NOT DETECTED.  The SARS-CoV-2 RNA is generally detectable in upper respiratory specimens  during the acute phase of infection. The lowest concentration of SARS-CoV-2 viral copies this assay can detect is 138 copies/mL. A negative result does not preclude SARS-Cov-2 infection and should not be used as the sole basis for  treatment or other patient management decisions. A negative result may occur with  improper specimen collection/handling, submission of specimen other than nasopharyngeal swab, presence of viral mutation(s) within the areas targeted by this assay, and inadequate number of viral copies(<138 copies/mL). A negative result must be combined with clinical observations, patient history, and epidemiological information. The expected result is Negative.  Fact Sheet for Patients:  EntrepreneurPulse.com.au  Fact Sheet for Healthcare Providers:  IncredibleEmployment.be  This test is no t yet approved or cleared by the [place] FDA and  has been authorized for detection and/or diagnosis of SARS-CoV-2 by FDA under an Emergency Use Authorization (EUA). This EUA will remain  in effect (meaning this test can be used) for the duration of the COVID-19 declaration under Section 564(b)(1) of the Act, 21 U.S.C.section 360bbb-3(b)(1), unless the authorization is terminated  or revoked sooner.       Influenza A by PCR NEGATIVE NEGATIVE Final   Influenza B by PCR NEGATIVE NEGATIVE Final    Comment: (NOTE) The Xpert Xpress SARS-CoV-2/FLU/RSV plus assay is intended as an aid in the diagnosis of influenza from Nasopharyngeal swab specimens and should not be used as a sole basis for treatment. Nasal washings and aspirates are unacceptable for Xpert Xpress SARS-CoV-2/FLU/RSV testing.  Fact Sheet for Patients: EntrepreneurPulse.com.au  Fact Sheet for Healthcare Providers: IncredibleEmployment.be  This test is not yet approved or cleared by the [place] FDA and has been authorized for detection and/or diagnosis of SARS-CoV-2 by FDA under an Emergency Use Authorization (EUA). This EUA will remain in effect (meaning this test can be used) for the duration of the COVID-19 declaration under Section 564(b)(1) of the Act, 21  U.S.C. section 360bbb-3(b)(1), unless the authorization is terminated or revoked.  Performed at [facility], [address], [place], [place] [address]   Culture, blood (routine x 2)     Status: None   Collection Time: 05/02/21 10:00 AM   Specimen: BLOOD  Result Value Ref Range Status   Specimen Description   Final    BLOOD LEFT ANTECUBITAL Performed at [facility] [address], [place], [place] [address]    Special Requests   Final    BOTTLES DRAWN AEROBIC ONLY Blood Culture adequate volume Performed at [facility] [address] [place], [place] [address]    Culture   Final    NO GROWTH 5 DAYS Performed at [facility], [place] [address], [place], [place] [address]    Report Status 05/07/2021 FINAL  Final  Culture, blood (routine x 2)     Status: None   Collection Time: 05/02/21 10:00 AM   Specimen: BLOOD  Result Value Ref Range Status   Specimen Description   Final    BLOOD BLOOD LEFT HAND Performed at [facility] [address], [place], [place] [address]    Special Requests   Final    BOTTLES DRAWN AEROBIC ONLY Blood Culture adequate volume Performed at [facility] [address], [place], [place] [address]    Culture   Final    NO GROWTH 5 DAYS Performed at [facility], [place] [address], [place], [place] [address]    Report Status 05/07/2021 FINAL  Final  Blood  Culture (routine x 2)     Status: Abnormal   Collection Time: 05/06/21  5:48 PM   Specimen: BLOOD LEFT FOREARM  Result Value Ref Range Status   Specimen Description   Final    BLOOD LEFT FOREARM Performed at [facility] [address] [place], [place] [address]    Special Requests   Final    BOTTLES DRAWN AEROBIC AND ANAEROBIC Blood Culture adequate volume Performed at [facility] [address], [place], [place] [address]    Culture  Setup Time    Final    GRAM NEGATIVE RODS ANAEROBIC BOTTLE ONLY Organism ID to follow CRITICAL RESULT CALLED TO, READ BACK BY AND VERIFIED WITH: Colin Rhein PHARMD [id number] 05/07/21 A BROWNING Performed at [facility], [place] [address], [place], [place] [address]    Culture PSEUDOMONAS AERUGINOSA (A)  Final   Report Status 05/09/2021 FINAL  Final   Organism ID, Bacteria PSEUDOMONAS AERUGINOSA  Final      Susceptibility   Pseudomonas aeruginosa - MIC*    CEFTAZIDIME 4 SENSITIVE Sensitive     CIPROFLOXACIN <=0.25 SENSITIVE Sensitive     GENTAMICIN <=1 SENSITIVE Sensitive     IMIPENEM 2 SENSITIVE Sensitive     PIP/TAZO <=4 SENSITIVE Sensitive     CEFEPIME 2 SENSITIVE Sensitive     * PSEUDOMONAS AERUGINOSA  Blood Culture ID Panel (Reflexed)     Status: Abnormal   Collection Time: 05/06/21  5:48 PM  Result Value Ref Range Status   Enterococcus faecalis NOT DETECTED NOT DETECTED Final   Enterococcus Faecium NOT DETECTED NOT DETECTED Final   Listeria monocytogenes NOT DETECTED NOT DETECTED Final   Staphylococcus species NOT DETECTED NOT DETECTED Final   Staphylococcus aureus (BCID) NOT DETECTED NOT DETECTED Final   Staphylococcus epidermidis NOT DETECTED NOT DETECTED Final   Staphylococcus lugdunensis NOT DETECTED NOT DETECTED Final   Streptococcus species NOT DETECTED NOT DETECTED Final   Streptococcus agalactiae NOT DETECTED NOT DETECTED Final   Streptococcus pneumoniae NOT DETECTED NOT DETECTED Final   Streptococcus pyogenes NOT DETECTED NOT DETECTED Final   A.calcoaceticus-baumannii NOT DETECTED NOT DETECTED Final   Bacteroides fragilis NOT DETECTED NOT DETECTED Final   Enterobacterales NOT DETECTED NOT DETECTED Final   Enterobacter cloacae complex NOT DETECTED NOT DETECTED Final   Escherichia coli NOT DETECTED NOT DETECTED Final   Klebsiella aerogenes NOT DETECTED NOT DETECTED Final   Klebsiella oxytoca NOT DETECTED NOT DETECTED Final   Klebsiella pneumoniae NOT DETECTED NOT DETECTED Final    Proteus species NOT DETECTED NOT DETECTED Final   Salmonella species NOT DETECTED NOT DETECTED Final   Serratia marcescens NOT DETECTED NOT DETECTED Final   Haemophilus influenzae NOT DETECTED NOT DETECTED Final   Neisseria meningitidis NOT DETECTED NOT DETECTED Final   Pseudomonas aeruginosa DETECTED (A) NOT DETECTED Final    Comment: CRITICAL RESULT CALLED TO, READ BACK BY AND VERIFIED WITH: Colin Rhein PHARMD 1753 05/07/21 A BROWNING    Stenotrophomonas maltophilia NOT DETECTED NOT DETECTED Final   Candida albicans NOT DETECTED NOT DETECTED Final   Candida auris NOT DETECTED NOT DETECTED Final   Candida glabrata NOT DETECTED NOT DETECTED Final   Candida krusei NOT DETECTED NOT DETECTED Final   Candida parapsilosis NOT DETECTED NOT DETECTED Final   Candida tropicalis NOT DETECTED NOT DETECTED Final   Cryptococcus neoformans/gattii NOT DETECTED NOT DETECTED Final   CTX-M ESBL NOT DETECTED NOT DETECTED Final   Carbapenem resistance IMP NOT DETECTED NOT DETECTED Final  Carbapenem resistance KPC NOT DETECTED NOT DETECTED Final   Carbapenem resistance NDM NOT DETECTED NOT DETECTED Final   Carbapenem resistance VIM NOT DETECTED NOT DETECTED Final    Comment: Performed at [facility], [place] [address] [place], [place] [address]  Resp Panel by RT-PCR (Flu A&B, Covid) Nasopharyngeal Swab     Status: None   Collection Time: 05/06/21  5:52 PM   Specimen: Nasopharyngeal Swab; Nasopharyngeal(NP) swabs in vial transport medium  Result Value Ref Range Status   SARS Coronavirus 2 by RT PCR NEGATIVE NEGATIVE Final    Comment: (NOTE) SARS-CoV-2 target nucleic acids are NOT DETECTED.  The SARS-CoV-2 RNA is generally detectable in upper respiratory specimens during the acute phase of infection. The lowest concentration of SARS-CoV-2 viral copies this assay can detect is 138 copies/mL. A negative result does not preclude SARS-Cov-2 infection and should not be used as the sole basis for treatment  or other patient management decisions. A negative result may occur with  improper specimen collection/handling, submission of specimen other than nasopharyngeal swab, presence of viral mutation(s) within the areas targeted by this assay, and inadequate number of viral copies(<138 copies/mL). A negative result must be combined with clinical observations, patient history, and epidemiological information. The expected result is Negative.  Fact Sheet for Patients:  EntrepreneurPulse.com.au  Fact Sheet for Healthcare Providers:  IncredibleEmployment.be  This test is no t yet approved or cleared by the [place] FDA and  has been authorized for detection and/or diagnosis of SARS-CoV-2 by FDA under an Emergency Use Authorization (EUA). This EUA will remain  in effect (meaning this test can be used) for the duration of the COVID-19 declaration under Section 564(b)(1) of the Act, 21 U.S.C.section 360bbb-3(b)(1), unless the authorization is terminated  or revoked sooner.       Influenza A by PCR NEGATIVE NEGATIVE Final   Influenza B by PCR NEGATIVE NEGATIVE Final    Comment: (NOTE) The Xpert Xpress SARS-CoV-2/FLU/RSV plus assay is intended as an aid in the diagnosis of influenza from Nasopharyngeal swab specimens and should not be used as a sole basis for treatment. Nasal washings and aspirates are unacceptable for Xpert Xpress SARS-CoV-2/FLU/RSV testing.  Fact Sheet for Patients: EntrepreneurPulse.com.au  Fact Sheet for Healthcare Providers: IncredibleEmployment.be  This test is not yet approved or cleared by the [place] FDA and has been authorized for detection and/or diagnosis of SARS-CoV-2 by FDA under an Emergency Use Authorization (EUA). This EUA will remain in effect (meaning this test can be used) for the duration of the COVID-19 declaration under Section 564(b)(1) of the Act, 21 U.S.C. section  360bbb-3(b)(1), unless the authorization is terminated or revoked.  Performed at [facility], [place] [address] [place], [place] [address]   Blood Culture (routine x 2)     Status: Abnormal   Collection Time: 05/06/21  6:02 PM   Specimen: BLOOD  Result Value Ref Range Status   Specimen Description   Final    BLOOD RIGHT ANTECUBITAL Performed at [facility] [address], [place], [place] [address]    Special Requests   Final    BOTTLES DRAWN AEROBIC AND ANAEROBIC Blood Culture adequate volume Performed at [facility] [address], [place],  [address]    Culture  Setup Time   Final    GRAM NEGATIVE RODS AEROBIC BOTTLE ONLY CRITICAL VALUE NOTED.  VALUE IS CONSISTENT WITH PREVIOUSLY REPORTED AND CALLED VALUE.    Culture (A)  Final  PSEUDOMONAS AERUGINOSA SUSCEPTIBILITIES PERFORMED ON PREVIOUS CULTURE WITHIN THE LAST 5 DAYS. Performed at [facility], [place] [address], [place], [place] [address]    Report Status 05/10/2021 FINAL  Final  Urine culture     Status: Abnormal   Collection Time: 05/06/21  6:24 PM   Specimen: In/Out Cath Urine  Result Value Ref Range Status   Specimen Description   Final    IN/OUT CATH URINE Performed at [facility] [address], [place], [place] [address]    Special Requests   Final    NONE Performed at [facility], [place] [address] [place], [place] [address]    Culture 70,000 COLONIES/mL PSEUDOMONAS AERUGINOSA (A)  Final   Report Status 05/09/2021 FINAL  Final   Organism ID, Bacteria PSEUDOMONAS AERUGINOSA (A)  Final      Susceptibility   Pseudomonas aeruginosa - MIC*    CEFTAZIDIME 4 SENSITIVE Sensitive     CIPROFLOXACIN <=0.25 SENSITIVE Sensitive     GENTAMICIN <=1 SENSITIVE Sensitive     IMIPENEM 2 SENSITIVE Sensitive     PIP/TAZO 8 SENSITIVE Sensitive     CEFEPIME 2 SENSITIVE Sensitive     * 70,000 COLONIES/mL  PSEUDOMONAS AERUGINOSA     Time coordinating discharge: Over 30 minutes  SIGNED:   Shawna Clamp, MD  [facility] 05/10/2021, 11:30 AM Pager   If 7PM-7AM, please contact night-coverage [email or web address]

## 2021-05-10 NOTE — Discharge Instructions (Signed)
Advised to take ciprofloxacin 500 mg twice daily for 3 more days to complete total 10-day treatment for Pseudomonas UTI/bacteremia. Advised to follow-up with Dr. [name] urologist for further evaluation.

## 2021-05-10 NOTE — Progress Notes (Signed)
Patient will be discharging later this afternoon with family. Belongings were returned. Will provide education on medications.

## 2021-05-10 NOTE — Progress Notes (Signed)
Patient ID: [name], male   DOB: January 08, 1946, 75 y.o.   MRN: [id number]   I saw [name] as a courtesy today to answer questions for him and his family.  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  He has undergone right ureteral stent placement about 10 days ago.  He developed a Pseudomonas UTI and is on appropriate antibiotic therapy for that with clinical improvement.  I reviewed the reason for the stent and the likely next step in management with either removal or replacement in the next few months.  He will be set up for outpatient f/u with Dr. [name] for ongoing management.  He also has apparently had long standing lower urinary tract symptoms and has been on terazosin but has persistent bothersome symptoms.  PVR has been acceptable in the hospital.  It was explained how his urinary bother might acutely be exacerbated by his infection and his indwelling stent and that he has no evidence of bladder outlet obstruction.  However, he will likely benefit from further discussion/management with Dr. [name] as an outpatient to optimize LUTS symptoms following resolution of infection.  All communication was performed with his daughter, Derrick Ravel, who speaks fluent [place].

## 2021-05-13 ENCOUNTER — Other Ambulatory Visit: Payer: Self-pay

## 2021-05-13 ENCOUNTER — Encounter: Payer: Self-pay | Admitting: Hematology and Oncology

## 2021-05-13 ENCOUNTER — Inpatient Hospital Stay: Payer: Medicare HMO | Attending: Hematology and Oncology | Admitting: Hematology and Oncology

## 2021-05-13 ENCOUNTER — Inpatient Hospital Stay: Payer: Medicare HMO

## 2021-05-13 VITALS — BP 138/70 | HR 64 | Temp 97.7°F | Resp 18 | Ht 70.0 in | Wt 252.3 lb

## 2021-05-13 DIAGNOSIS — I4891 Unspecified atrial fibrillation: Secondary | ICD-10-CM | POA: Diagnosis not present

## 2021-05-13 DIAGNOSIS — G4733 Obstructive sleep apnea (adult) (pediatric): Secondary | ICD-10-CM | POA: Diagnosis not present

## 2021-05-13 DIAGNOSIS — I129 Hypertensive chronic kidney disease with stage 1 through stage 4 chronic kidney disease, or unspecified chronic kidney disease: Secondary | ICD-10-CM | POA: Diagnosis not present

## 2021-05-13 DIAGNOSIS — E1122 Type 2 diabetes mellitus with diabetic chronic kidney disease: Secondary | ICD-10-CM | POA: Diagnosis not present

## 2021-05-13 DIAGNOSIS — C8333 Diffuse large B-cell lymphoma, intra-abdominal lymph nodes: Secondary | ICD-10-CM | POA: Diagnosis present

## 2021-05-13 DIAGNOSIS — R591 Generalized enlarged lymph nodes: Secondary | ICD-10-CM

## 2021-05-13 LAB — CBC WITH DIFFERENTIAL (CANCER CENTER ONLY)
Abs Immature Granulocytes: 0.15 10*3/uL — ABNORMAL HIGH (ref 0.00–0.07)
Basophils Absolute: 0 10*3/uL (ref 0.0–0.1)
Basophils Relative: 1 %
Eosinophils Absolute: 0.3 10*3/uL (ref 0.0–0.5)
Eosinophils Relative: 7 %
HCT: 29.7 % — ABNORMAL LOW (ref 39.0–52.0)
Hemoglobin: 9.6 g/dL — ABNORMAL LOW (ref 13.0–17.0)
Immature Granulocytes: 4 %
Lymphocytes Relative: 37 %
Lymphs Abs: 1.4 10*3/uL (ref 0.7–4.0)
MCH: 25.6 pg — ABNORMAL LOW (ref 26.0–34.0)
MCHC: 32.3 g/dL (ref 30.0–36.0)
MCV: 79.2 fL — ABNORMAL LOW (ref 80.0–100.0)
Monocytes Absolute: 0.8 10*3/uL (ref 0.1–1.0)
Monocytes Relative: 21 %
Neutro Abs: 1.1 10*3/uL — ABNORMAL LOW (ref 1.7–7.7)
Neutrophils Relative %: 30 %
Platelet Count: 186 10*3/uL (ref 150–400)
RBC: 3.75 MIL/uL — ABNORMAL LOW (ref 4.22–5.81)
RDW: 15.1 % (ref 11.5–15.5)
WBC Count: 3.7 10*3/uL — ABNORMAL LOW (ref 4.0–10.5)
nRBC: 0 % (ref 0.0–0.2)

## 2021-05-13 LAB — CMP (CANCER CENTER ONLY)
ALT: 58 U/L — ABNORMAL HIGH (ref 0–44)
AST: 31 U/L (ref 15–41)
Albumin: 2.9 g/dL — ABNORMAL LOW (ref 3.5–5.0)
Alkaline Phosphatase: 82 U/L (ref 38–126)
Anion gap: 10 (ref 5–15)
BUN: 25 mg/dL — ABNORMAL HIGH (ref 8–23)
CO2: 23 mmol/L (ref 22–32)
Calcium: 8.6 mg/dL — ABNORMAL LOW (ref 8.9–10.3)
Chloride: 107 mmol/L (ref 98–111)
Creatinine: 1.77 mg/dL — ABNORMAL HIGH (ref 0.61–1.24)
GFR, Estimated: 40 mL/min — ABNORMAL LOW (ref >60)
Glucose, Bld: 370 mg/dL — ABNORMAL HIGH (ref 70–99)
Potassium: 4.8 mmol/L (ref 3.5–5.1)
Sodium: 140 mmol/L (ref 135–145)
Total Bilirubin: 0.4 mg/dL (ref 0.3–1.2)
Total Protein: 5.8 g/dL — ABNORMAL LOW (ref 6.5–8.1)

## 2021-05-13 LAB — LACTATE DEHYDROGENASE: LDH: 201 U/L — ABNORMAL HIGH (ref 98–192)

## 2021-05-13 LAB — SEDIMENTATION RATE: Sed Rate: 44 mm/hr — ABNORMAL HIGH (ref 0–16)

## 2021-05-13 LAB — URIC ACID: Uric Acid, Serum: 4.8 mg/dL (ref 3.7–8.6)

## 2021-05-13 NOTE — Progress Notes (Signed)
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 CT abdomen/pelvis shows pathologic mesenteric, retroperitoneal, and pelvic adenopathy, as described above. Moderate splenomegaly. Lobulated retroperitoneal soft tissue masses in the region of the right ureteropelvic junction likely resulting in mild right hydronephrosis secondary to extrinsic mass effect as well as within the right retroperitoneum 05/04/2021: needle core biopsy of right retroperitoneal lymph node shows large B cell lymphoma. 05/13/2021: establish care with Dr. Lorenso [name]   CHIEF COMPLAINTS/PURPOSE OF CONSULTATION:  "Diffuse Large B Cell Lymphoma "  HISTORY OF PRESENTING ILLNESS:  [name] 75 y.o. male with medical history significant for DM type II, HTN, and OSA who presents for evaluation of newly diagnosis DLBCL.   On review of the previous records [name] presented with right lower quadrant abdominal pain on 04/30/2021.  A CT abdomen pelvis that time showed pathologic mesenteric, retroperitoneal, and pelvic adenopathy as well as splenomegaly.  A biopsy of 1 of these lesions was performed on 05/04/2021 which showed large B-cell lymphoma.  Due to concern for this diagnosis of lymphoma the patient was referred to hematology for further evaluation management.  On exam today [name] is accompanied by his wife and daughter.  Our discussion is assisted by a hospital approved interpreter via iPad.  His daughter does speak English well, but does encourage the use of translator services.  He reports that his symptoms began with pain in the right side of the abdomen.  He notes he went to the hospital where they performed a CT scan and found a mass on his ureter.  He is not  aware of the results of the biopsy on discussion today.  On further discussion he reports that he has been having sweats and chills that occurred on 1 at night.  This has not been a frequent occurrence.  He notes that he has not been having any issues with weight loss and otherwise has not noticed any new symptoms.  He reports that he had a paternal aunt who had ovarian cancer and his father had a history of heart issues.  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  He reports otherwise no fevers, chills, sweats, nausea, vomiting or diarrhea.  He denies any other bumps or lumps.  A full 10 point ROS is listed below.  MEDICAL HISTORY:  Past Medical History:  Diagnosis Date   Anginal pain ([place])    Coronary artery disease    Diabetes mellitus without complication ([place])    Dysrhythmia    Hypertension    Sleep apnea     SURGICAL HISTORY: Past Surgical History:  Procedure Laterality Date   CARDIAC CATHETERIZATION     CYSTOSCOPY WITH RETROGRADE PYELOGRAM, URETEROSCOPY AND STENT PLACEMENT Right 05/01/2021   Procedure: CYSTOSCOPY WITH RIGHT  RETROGRADE PYELOGRAM,  AND RIGHT STENT PLACEMENT;  Surgeon: Festus Aloe, MD;  Location: [facility];  Service: Urology;  Laterality: Right;   EYE SURGERY     INJECTION OF SILICONE OIL Left 09/08/8526   Procedure: INJECTION OF SILICONE OIL;  Surgeon: Sherlynn Stalls, MD;  Location: [place];  Service: Ophthalmology;  Laterality: Left;   INJECTION OF SILICONE OIL Left 7/82/4235   Procedure: INJECTION OF  SILICONE OIL;  Surgeon: Sherlynn Stalls, MD;  Location: [place];  Service: Ophthalmology;  Laterality: Left;   JOINT REPLACEMENT Right    hip   LASER PHOTO ABLATION Left 12/25/2020   Procedure: LASER PHOTO ABLATION;  Surgeon: Sherlynn Stalls, MD;  Location: [place];  Service: Ophthalmology;  Laterality: Left;   PARS PLANA VITRECTOMY Left 11/10/2020   Procedure: PARS PLANA  VITRECTOMY WITH 25 GAUGE;  Surgeon: Sherlynn Stalls, MD;  Location: [place];  Service: Ophthalmology;  Laterality: Left;   PARS PLANA VITRECTOMY Left 12/25/2020   Procedure: PARS PLANA VITRECTOMY WITH 25 GAUGE IN LEFT EYE;  Surgeon: Sherlynn Stalls, MD;  Location: [place];  Service: Ophthalmology;  Laterality: Left;   PERFLUORONE INJECTION Left 12/25/2020   Procedure: PERFLUORONE INJECTION;  Surgeon: Sherlynn Stalls, MD;  Location: [place];  Service: Ophthalmology;  Laterality: Left;   PHOTOCOAGULATION WITH LASER Left 11/10/2020   Procedure: PHOTOCOAGULATION WITH LASER;  Surgeon: Sherlynn Stalls, MD;  Location: [place];  Service: Ophthalmology;  Laterality: Left;   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   Procedure: RETINECTOMY LEFT EYE;  Surgeon: Sherlynn Stalls, MD;  Location: [place];  Service: Ophthalmology;  Laterality: Left;   SILICON OIL REMOVAL Left 06/26/7516   Procedure: SILICONE OIL REMOVAL;  Surgeon: Sherlynn Stalls, MD;  Location: [place];  Service: Ophthalmology;  Laterality: Left;    SOCIAL HISTORY: Social History   Socioeconomic History   Marital status: Married    Spouse name: Not on file   Number of children: Not on file   Years of education: Not on file   Highest education level: Not on file  Occupational History   Not on file  Tobacco Use   Smoking status: Never   Smokeless tobacco: Never  Vaping Use   Vaping Use: Never used  Substance and Sexual Activity   Alcohol use: Not Currently   Drug use: Never   Sexual activity: Yes  Other Topics Concern   Not on file  Social History Narrative   Not on file   Social Determinants of Health   Financial Resource Strain: Not on file  Food Insecurity: Not on file  Transportation Needs: Not on file  Physical Activity: Not on file  Stress: Not on file  Social Connections: Not on file  Intimate Partner Violence: Not on file    FAMILY HISTORY: Family History  Problem Relation Age of Onset   CAD Other      ALLERGIES:  has No Known Allergies.  MEDICATIONS:  Current Outpatient Medications  Medication Sig Dispense Refill   acetaminophen (TYLENOL) 325 MG tablet Take 650 mg by mouth every 6 (six) hours as needed for fever.     amiodarone (PACERONE) 200 MG tablet Take 200 mg by mouth daily.     amLODipine (NORVASC) 10 MG tablet Take 1 tablet (10 mg total) by mouth daily. 30 tablet 1   atorvastatin (LIPITOR) 20 MG tablet Take 20 mg by mouth every evening.     ciprofloxacin (CIPRO) 500 MG tablet Take 1 tablet (500 mg total) by mouth 2 (two) times daily for 5 days. 10 tablet 0   clopidogrel (PLAVIX) 75 MG tablet Take 75 mg by mouth daily.     ELIQUIS 5 MG TABS tablet Take 5 mg by mouth 2 (two) times daily.     isosorbide mononitrate (IMDUR) 30 MG 24 hr tablet Take 30 mg by mouth daily.     LANTUS SOLOSTAR 100 UNIT/ML Solostar Pen Inject 34 Units into the  skin every evening.     metoprolol tartrate (LOPRESSOR) 50 MG tablet Take 1 tablet (50 mg total) by mouth 2 (two) times daily. 60 tablet 1   nitroGLYCERIN (NITROSTAT) 0.3 MG SL tablet Place 0.3 mg under the tongue every 5 (five) minutes x 3 doses as needed for chest pain.     terazosin (HYTRIN) 2 MG capsule Take 2 mg by mouth at bedtime.     No current facility-administered medications for this visit.    REVIEW OF SYSTEMS:   Constitutional: ( - ) fevers, ( - )  chills , ( - ) night sweats Eyes: ( - ) blurriness of vision, ( - ) double vision, ( - ) watery eyes Ears, nose, mouth, throat, and face: ( - ) mucositis, ( - ) sore throat Respiratory: ( - ) cough, ( - ) dyspnea, ( - ) wheezes Cardiovascular: ( - ) palpitation, ( - ) chest discomfort, ( - ) lower extremity swelling Gastrointestinal:  ( - ) nausea, ( - ) heartburn, ( - ) change in bowel habits Skin: ( - ) abnormal skin rashes Lymphatics: ( - ) new lymphadenopathy, ( - ) easy bruising Neurological: ( - ) numbness, ( - ) tingling, ( - ) new weaknesses Behavioral/Psych: ( - ) mood  change, ( - ) new changes  All other systems were reviewed with the patient and are negative.  PHYSICAL EXAMINATION: ECOG PERFORMANCE STATUS: 1 - Symptomatic but completely ambulatory  Vitals:   05/13/21 0908  BP: 138/70  Pulse: 64  Resp: 18  Temp: 97.7 F (36.5 C)  SpO2: 100%   Filed Weights   05/13/21 0908  Weight: 252 lb 4.8 oz (114.4 kg)    GENERAL: well appearing elderly Caucasian male in NAD  SKIN: skin color, texture, turgor are normal, no rashes or significant lesions EYES: conjunctiva are pink and non-injected, sclera clear NECK: supple, non-tender LYMPH:  no palpable lymphadenopathy in the cervical, axillary or supraclavicular lymph nodes.  LUNGS: clear to auscultation and percussion with normal breathing effort HEART: regular rate & rhythm and no murmurs and no lower extremity edema PSYCH: alert & oriented x 3, fluent speech NEURO: no focal motor/sensory deficits  LABORATORY DATA:  I have reviewed the data as listed CBC Latest Ref Rng & Units 05/13/2021 05/10/2021 05/09/2021  WBC 4.0 - 10.5 K/uL 3.7(L) 2.6(L) 2.6(L)  Hemoglobin 13.0 - 17.0 g/dL 9.6(L) 9.3(L) 9.2(L)  Hematocrit 39.0 - 52.0 % 29.7(L) 29.4(L) 29.1(L)  Platelets 150 - 400 K/uL 186 175 171    CMP Latest Ref Rng & Units 05/13/2021 05/10/2021 05/09/2021  Glucose 70 - 99 mg/dL 370(H) 220(H) 170(H)  BUN 8 - 23 mg/dL 25(H) 26(H) 25(H)  Creatinine 0.61 - 1.24 mg/dL 1.77(H) 1.77(H) 1.79(H)  Sodium 135 - 145 mmol/L 140 139 139  Potassium 3.5 - 5.1 mmol/L 4.8 3.9 3.5  Chloride 98 - 111 mmol/L 107 104 104  CO2 22 - 32 mmol/L 23 28 28   Calcium 8.9 - 10.3 mg/dL 8.6(L) 8.3(L) 8.3(L)  Total Protein 6.5 - 8.1 g/dL 5.8(L) - -  Total Bilirubin 0.3 - 1.2 mg/dL 0.4 - -  Alkaline Phos 38 - 126 U/L 82 - -  AST 15 - 41 U/L 31 - -  ALT 0 - 44 U/L 58(H) - -     PATHOLOGY: SURGICAL PATHOLOGY  CASE: [id number]  PATIENT: Winferd Humphrey  Surgical Pathology Report   Clinical History: right retroperitoneal mass  ([place])   FINAL MICROSCOPIC DIAGNOSIS:   A. MASS,  RIGHT RETROPERITONEAL, NEEDLE CORE:  -Large B-cell lymphoma  -See comment   COMMENT:   The sections show needle core biopsy fragments of soft tissue displaying  an atypical lymphoid proliferation of primarily large lymphoid appearing  cells characterized by vesicular chromatin, prominent nucleoli and  moderately abundant clear to eosinophilic cytoplasm.  This is associated  with brisk mitosis, apoptosis, and confluent necrosis.  The appearance  is diffuse with no atypical follicles.  No fresh tissue is available for  flow cytometric analysis and hence a battery of immunohistochemical  stains was performed and show that the atypical lymphoid cells are  positive for LCA, CD20, CD79a, PAX5, BCL-2, BCL6, and MUM 1.  Only  scattered cells are positive for CD138.  No significant staining is seen  with CD30, cyclin D1, CD10, CD34, EBV in situ hybridization, S100,  cytokeratin AE1/AE3, or cytokeratin 8/18.  There is an admixed T-cell  population to a lesser extent in the background as seen with CD3 and CD5  and there is no apparent co-expression of CD5 in B-cell areas.  The  overall findings are consistent with large B-cell lymphoma, ABC type.  The results were discussed with Dr. Lorenso [name] on 05/07/21.   GROSS DESCRIPTION:   The specimen is received in formalin and consists of 3 cores of tan-pink  soft tissue, ranging from 1.8 to 2.0 cm in length by 0.1 cm in diameter.  The specimen is entirely submitted in 2 cassettes.  Craig Staggers 05/04/2021)   Final Diagnosis performed by Susanne Greenhouse, MD.   Electronically signed  05/07/2021  RADIOGRAPHIC STUDIES: I have personally reviewed the radiological images as listed and agreed with the findings in the report. CT Abdomen Pelvis Wo Contrast  Result Date: 04/30/2021 CLINICAL DATA:  Right lower quadrant abdominal pain EXAM: CT ABDOMEN AND PELVIS WITHOUT CONTRAST TECHNIQUE: Multidetector CT imaging of the abdomen  and pelvis was performed following the standard protocol without IV contrast. COMPARISON:  None. FINDINGS: Lower chest: 6 mm mean diameter noncalcified pulmonary nodule is seen within the visualized right middle lobe, indeterminate. Scattered ground-glass pulmonary infiltrates in the noted at the lung bases bilaterally with subtle, smooth interlobular septal thickening possibly representing changes of trace pulmonary edema, possibly cardiogenic in nature. Extensive multi-vessel coronary artery calcification is present with probable stenting of the distal right coronary artery. Global cardiac size within normal limits. No pericardial effusion. Hypoattenuation of the cardiac blood pool is in keeping with at least mild anemia. Hepatobiliary: No focal liver abnormality is seen. No gallstones, gallbladder wall thickening, or biliary dilatation. Pancreas: Unremarkable Spleen: There is moderate splenomegaly with the spleen measuring 17.2 cm in greatest dimension. No definite intrasplenic lesion identified on this noncontrast examination. Adrenals/Urinary Tract: The adrenal glands are unremarkable. The kidneys are normal in size and position. There is mild right hydronephrosis secondary to an obstructing lobulated mass in the region of the right ureteropelvic junction measuring 3.1 x 4.5 x 3.8 cm on axial image # 58 and coronal image # 67 while difficult to accurately characterize in the absence of contrast administration, the mass appears external to the right ureter and demonstrates occlusion of the a proximal right ureter likely related to extrinsic mass effect, best appreciated on coronal imaging. Mild bilateral nonspecific perinephric stranding. No intrarenal or ureteral calculi are identified no hydronephrosis on the left. Appearing calcifications along the posterior wall of the bladder in the region of the ureteropelvic junction may represent posterior layering calculi within the bladder lumen or may be artifactual  and related to  streak artifact from right total hip arthroplasty. Stomach/Bowel: The stomach, small bowel, and large bowel are unremarkable. No evidence of obstruction or focal inflammation. The appendix is normal. No free intraperitoneal gas or fluid. Vascular/Lymphatic: There is extensive mesenteric infiltration. Additionally, there is pathologic mesenteric adenopathy with the dominant mass measuring 6.7 x 4.1 cm at axial image # 44/2. Multiple additional pathologically enlarged mesenteric lymph nodes are identified demonstrating irregular margins. There is, additionally, pathologic bilateral common iliac and external iliac lymphadenopathy as well as right pelvic sidewall lymphadenopathy with the index right common iliac lymph node measuring 1.8 x 3.7 cm at axial image # 72/2. Borderline pathologic aortocaval and left periaortic lymphadenopathy is present. There is moderate aortoiliac atherosclerotic calcification. No aortic aneurysm. Reproductive: Mild prostatic enlargement. Seminal vesicles are unremarkable. Other: Soft tissue nodule within the right retroperitoneum adjacent to the right psoas muscle may represent pathologic retroperitoneal adenopathy or a metastatic implant. This measures 1.7 x 3.0 cm at axial image # 63/2. 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 Degenerative changes are seen within the lumbar spine. No lytic or blastic bone lesions are identified. IMPRESSION: Pathologic mesenteric, retroperitoneal, and pelvic adenopathy, as described above. Moderate splenomegaly. Lobulated retroperitoneal soft tissue masses in the region of the right ureteropelvic junction likely resulting in mild right hydronephrosis secondary to extrinsic mass effect as well as within the right retroperitoneum. 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 Less likely, this may represent metastatic disease secondary to a primary urothelial malignancy or potentially metastatic disease  related to underlying prostate cancer, however, the nodal distribution would be unusual for the former and the lack of osseous metastatic disease given the extensive nodal involvement would be unusual for the latter. PET CT examination may be more helpful for further evaluation as well as identification of a a optimal tissue sampling target. Pathologic right external iliac lymphadenopathy may be amenable to ultrasound-guided sampling for definitive diagnosis. 6 mm of right middle lobe pulmonary nodule. Non-contrast chest CT at 6-12 months is recommended. If the nodule is stable at time of repeat CT, then future CT at 18-24 months (from today's scan) is considered optional for low-risk patients, but is recommended for high-risk patients. This recommendation follows the consensus statement: Guidelines for Management of Incidental Pulmonary Nodules Detected on CT Images: From the Fleischner Society 2017; Radiology 2017; 284:228-243. Extensive coronary artery calcification. Scattered ground-glass pulmonary infiltrates and subtle smooth interlobular septal thickening may reflect changes of mild cardiogenic failure. At least mild anemia. Aortic Atherosclerosis (ICD10-I70.0). Electronically Signed   By: Fidela Salisbury MD   On: 04/30/2021 14:38   US RENAL  Result Date: 05/06/2021 CLINICAL DATA:  Acute on chronic kidney disease EXAM: RENAL / URINARY TRACT ULTRASOUND COMPLETE COMPARISON:  CT 04/30/2021 FINDINGS: Right Kidney: Renal measurements: 11.8 x 7.5 x 5.6 cm = volume: 259.6 mL. Echogenicity within normal limits. Mild right hydronephrosis which is likely improved as compared with recent CT. 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 Left Kidney: Renal measurements: 12.3 x 6 x 5.4 cm = volume: 7.7 mL. Small cyst at the midpole measuring 12 x 14 x 11 mm additional small cyst at the midpole measuring 12 x 9 x 15 mm. Bladder: Appears normal for degree of bladder distention. Other: None. IMPRESSION: 1.  Mild right hydronephrosis probably improved as compared with prior CT. Right UPJ region mass on CT is not well demonstrated on sonogram today. 2. Small left renal cysts.  No left hydronephrosis Electronically Signed  By: Donavan Foil M.D.   On: 05/06/2021 21:28   DG Chest Port 1 View  Result Date: 05/06/2021 CLINICAL DATA:  75 year old male with concern for sepsis. EXAM: PORTABLE CHEST 1 VIEW COMPARISON:  None. FINDINGS: No focal consolidation, pleural effusion, or pneumothorax. Borderline cardiomegaly. Atherosclerotic calcification of the aorta. Bilateral hilar prominence, likely dilatation of the main pulmonary arteries and suggestive of pulmonary hypertension. Adenopathy is less likely but not excluded. Attention on follow-up imaging recommended. IMPRESSION: 1. No acute cardiopulmonary process. 2. Borderline cardiomegaly with findings suggestive of pulmonary hypertension. Electronically Signed   By: Anner Crete M.D.   On: 05/06/2021 18:27   DG C-Arm 1-60 Min-No Report  Result Date: 05/01/2021 Fluoroscopy was utilized by the requesting physician.  No radiographic interpretation.   CT RENAL BIOPSY  Result Date: 05/04/2021 INDICATION: 75 year old male with right infrarenal retroperitoneal mass and abdominal lymphadenopathy of indeterminate etiology. EXAM: CT BIOPSY CORE RENAL COMPARISON:  05/30/2021 MEDICATIONS: None. ANESTHESIA/SEDATION: Fentanyl 100 mcg IV; Versed 2 mg IV Sedation time: 13 minutes; The patient was continuously monitored during the procedure by the interventional radiology nurse under my direct supervision. CONTRAST:  None. COMPLICATIONS: None immediate. PROCEDURE: Informed consent was obtained from the patient following an explanation of the procedure, risks, benefits and alternatives. A time out was performed prior to the initiation of the procedure. The patient was positioned prone on the CT table and a limited CT was performed for procedural planning demonstrating similar  appearing soft tissue mass about the inferior aspect of the right kidney which appears to surround the proximal right ureter. The procedure was planned. The operative site was prepped and draped in the usual sterile fashion. Appropriate trajectory was confirmed with a 22 gauge spinal needle after the adjacent tissues were anesthetized with 1% Lidocaine with epinephrine. Under intermittent CT guidance, a 17 gauge coaxial needle was advanced into the peripheral aspect of the mass. Appropriate positioning was confirmed and total of 3 samples were obtained with an 18 gauge core needle biopsy device. The co-axial needle was removed while injecting Gel-Foam slurry and hemostasis was achieved with manual compression. A limited postprocedural CT was negative for hemorrhage or additional complication. A dressing was placed. The patient tolerated the procedure well without immediate postprocedural complication. IMPRESSION: Technically successful CT guided core needle biopsy of right infrarenal retroperitoneal mass. Ruthann Cancer, MD [facility] Electronically Signed   By: Ruthann Cancer MD   On: 05/04/2021 14:23    ASSESSMENT & PLAN [name] 75 y.o. male with medical history significant for DM type II, HTN, and OSA who presents for evaluation of newly diagnosis DLBCL.   After review of the labs, review of the records, and discussion with the patient the patients findings are most consistent with advanced stage diffuse large B-cell lymphoma.  The patient appears to have splenic involvement which we will confirm with the PET CT scan.  Additionally we do not have any information above the diaphragm which will be aided with further PET/CT.  Patient does have a cardiac history significant for atrial fibrillation on amiodarone and therefore we may need to consider dropping anthracyclines from his regimen.  Either way were planning for an R-CHOP based regimen  moving forward.  Patient will require port placement and echocardiogram.  We will plan to see the patient back in approximately 2 weeks in order to start treatment.  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 --We will discuss ordering double HIT panel with pathology --We will plan to start patient on R-CHOP chemotherapy pending echocardiogram and PET CT scan. --If there are issues with cardiac function consider R-CEOP instead (he is on amiodarone for atrial fibrillation) -- Plan to return to clinic in approximately 2 weeks to start treatment.  #Supportive Care -- chemotherapy education to be scheduled  -- port placement to be scheduled.  -- zofran 8mg  q8H PRN and compazine 10mg  PO q6H for nausea -- allopurinol 300mg  PO daily for TLS prophylaxis -- EMLA cream for port -- no pain medication required at this time.   Orders Placed This Encounter  Procedures   IR IMAGING GUIDED PORT INSERTION    Standing Status:   Future    Standing Expiration Date:   05/13/2022    Order Specific Question:   Reason for Exam (SYMPTOM  OR DIAGNOSIS REQUIRED)    Answer:   preparation for chemotherapy for lymphoma    Order Specific Question:   Preferred Imaging Location?    Answer:   [facility]   NM PET Image Initial (PI) Skull Base To Thigh    Standing Status:   Future    Standing Expiration Date:   05/13/2022    Order Specific Question:   If indicated for the ordered procedure, I authorize the administration of a radiopharmaceutical per Radiology protocol    Answer:   Yes    Order Specific Question:   Preferred imaging location?    Answer:   [place]   CBC with Differential (Cancer Center Only)    Standing Status:   Future    Number of Occurrences:   1    Standing Expiration Date:   05/13/2022   CMP ([place] only)    Standing Status:   Future    Number of Occurrences:    1    Standing Expiration Date:   05/13/2022   Lactate dehydrogenase (LDH)    Standing Status:   Future    Number of Occurrences:   1    Standing Expiration Date:   05/13/2022   Sedimentation rate    Standing Status:   Future    Number of Occurrences:   1    Standing Expiration Date:   05/13/2022   Uric acid    Standing Status:   Future    Number of Occurrences:   1    Standing Expiration Date:   05/13/2022   ECHOCARDIOGRAM COMPLETE    Standing Status:   Future    Standing Expiration Date:   05/13/2022    Order Specific Question:   Where should this test be performed    Answer:   [place]    Order Specific Question:   Perflutren DEFINITY (image enhancing agent) should be administered unless hypersensitivity or allergy exist    Answer:   Administer Perflutren    Order Specific Question:   Is a special reader required? (athlete or structural heart)    Answer:   No    Order Specific Question:   Does this study need to be read by the Structural team/Level 3 readers?    Answer:   No    Order Specific Question:   Reason for exam-Echo    Answer:   Chemo  Z09    Order Specific Question:   Other Comments    Answer:   Study required prior to start of chemotherapy    All questions  were answered. The patient knows to call the clinic with any problems, questions or concerns.  A total of more than 60 minutes were spent on this encounter with face-to-face time and non-face-to-face time, including preparing to see the patient, ordering tests and/or medications, counseling the patient and coordination of care as outlined above.   Ledell Peoples, MD Department of Hematology/Oncology [place] [facility] Phone: [phone] Pager: [phone] Email: [email or web address]  05/13/2021 6:36 PM

## 2021-05-14 NOTE — Anesthesia Postprocedure Evaluation (Signed)
Anesthesia Post Note  Patient: [name]  Procedure(s) Performed: CYSTOSCOPY WITH RIGHT  RETROGRADE PYELOGRAM,  AND RIGHT STENT PLACEMENT (Right: Ureter)     Patient location during evaluation: PACU Anesthesia Type: General Level of consciousness: awake and sedated Pain management: pain level controlled Vital Signs Assessment: post-procedure vital signs reviewed and stable Respiratory status: spontaneous breathing Cardiovascular status: stable Postop Assessment: no apparent nausea or vomiting Anesthetic complications: no   No notable events documented.  Last Vitals:  Vitals:   05/05/21 0501 05/05/21 1332  BP: (!) 169/76 (!) 148/70  Pulse: 60 64  Resp: 18 16  Temp: 36.7 C 36.6 C  SpO2: 97% 100%    Last Pain:  Vitals:   05/05/21 1332  TempSrc: Oral  PainSc:                  Huston Foley

## 2021-05-20 ENCOUNTER — Telehealth: Payer: Self-pay | Admitting: Hematology and Oncology

## 2021-05-20 NOTE — Telephone Encounter (Signed)
Sch per 06/29 los, spoke with pt wife, pt aware

## 2021-05-20 NOTE — Telephone Encounter (Signed)
R/s appts per 7/6 sch msg. Called pt, no answer. Left msg using the interpreter services and let pt know of updated appts date and times.

## 2021-05-23 ENCOUNTER — Inpatient Hospital Stay (HOSPITAL_COMMUNITY)
Admission: EM | Admit: 2021-05-23 | Discharge: 2021-06-01 | DRG: 872 | Disposition: A | Discharge status: Home Health | Payer: Medicare HMO | Attending: Internal Medicine | Admitting: Internal Medicine

## 2021-05-23 ENCOUNTER — Encounter (HOSPITAL_COMMUNITY): Payer: Self-pay

## 2021-05-23 ENCOUNTER — Emergency Department (HOSPITAL_COMMUNITY): Payer: Medicare HMO

## 2021-05-23 ENCOUNTER — Other Ambulatory Visit: Payer: Self-pay

## 2021-05-23 ENCOUNTER — Inpatient Hospital Stay (HOSPITAL_COMMUNITY): Payer: Medicare HMO

## 2021-05-23 DIAGNOSIS — E785 Hyperlipidemia, unspecified: Secondary | ICD-10-CM | POA: Diagnosis present

## 2021-05-23 DIAGNOSIS — N1330 Unspecified hydronephrosis: Secondary | ICD-10-CM | POA: Diagnosis not present

## 2021-05-23 DIAGNOSIS — Z7902 Long term (current) use of antithrombotics/antiplatelets: Secondary | ICD-10-CM | POA: Diagnosis not present

## 2021-05-23 DIAGNOSIS — A4152 Sepsis due to Pseudomonas: Secondary | ICD-10-CM | POA: Diagnosis present

## 2021-05-23 DIAGNOSIS — G4730 Sleep apnea, unspecified: Secondary | ICD-10-CM | POA: Diagnosis not present

## 2021-05-23 DIAGNOSIS — Z28310 Unvaccinated for covid-19: Secondary | ICD-10-CM | POA: Diagnosis not present

## 2021-05-23 DIAGNOSIS — I38 Endocarditis, valve unspecified: Secondary | ICD-10-CM | POA: Diagnosis present

## 2021-05-23 DIAGNOSIS — E1122 Type 2 diabetes mellitus with diabetic chronic kidney disease: Secondary | ICD-10-CM | POA: Diagnosis present

## 2021-05-23 DIAGNOSIS — E860 Dehydration: Secondary | ICD-10-CM | POA: Diagnosis present

## 2021-05-23 DIAGNOSIS — D649 Anemia, unspecified: Secondary | ICD-10-CM | POA: Diagnosis present

## 2021-05-23 DIAGNOSIS — Z20822 Contact with and (suspected) exposure to covid-19: Secondary | ICD-10-CM | POA: Diagnosis present

## 2021-05-23 DIAGNOSIS — Z79899 Other long term (current) drug therapy: Secondary | ICD-10-CM

## 2021-05-23 DIAGNOSIS — N182 Chronic kidney disease, stage 2 (mild): Secondary | ICD-10-CM | POA: Diagnosis not present

## 2021-05-23 DIAGNOSIS — D6489 Other specified anemias: Secondary | ICD-10-CM | POA: Diagnosis present

## 2021-05-23 DIAGNOSIS — R9431 Abnormal electrocardiogram [ECG] [EKG]: Secondary | ICD-10-CM | POA: Diagnosis present

## 2021-05-23 DIAGNOSIS — E669 Obesity, unspecified: Secondary | ICD-10-CM | POA: Diagnosis present

## 2021-05-23 DIAGNOSIS — R7881 Bacteremia: Secondary | ICD-10-CM | POA: Diagnosis not present

## 2021-05-23 DIAGNOSIS — D61818 Other pancytopenia: Secondary | ICD-10-CM | POA: Diagnosis present

## 2021-05-23 DIAGNOSIS — Z8249 Family history of ischemic heart disease and other diseases of the circulatory system: Secondary | ICD-10-CM

## 2021-05-23 DIAGNOSIS — N179 Acute kidney failure, unspecified: Secondary | ICD-10-CM | POA: Diagnosis present

## 2021-05-23 DIAGNOSIS — E1169 Type 2 diabetes mellitus with other specified complication: Secondary | ICD-10-CM | POA: Diagnosis present

## 2021-05-23 DIAGNOSIS — D709 Neutropenia, unspecified: Secondary | ICD-10-CM | POA: Diagnosis present

## 2021-05-23 DIAGNOSIS — C8330 Diffuse large B-cell lymphoma, unspecified site: Secondary | ICD-10-CM | POA: Diagnosis not present

## 2021-05-23 DIAGNOSIS — Z955 Presence of coronary angioplasty implant and graft: Secondary | ICD-10-CM

## 2021-05-23 DIAGNOSIS — R3911 Hesitancy of micturition: Secondary | ICD-10-CM | POA: Diagnosis present

## 2021-05-23 DIAGNOSIS — I358 Other nonrheumatic aortic valve disorders: Secondary | ICD-10-CM | POA: Diagnosis not present

## 2021-05-23 DIAGNOSIS — I480 Paroxysmal atrial fibrillation: Secondary | ICD-10-CM | POA: Diagnosis present

## 2021-05-23 DIAGNOSIS — Z7984 Long term (current) use of oral hypoglycemic drugs: Secondary | ICD-10-CM

## 2021-05-23 DIAGNOSIS — C8333 Diffuse large B-cell lymphoma, intra-abdominal lymph nodes: Secondary | ICD-10-CM | POA: Diagnosis present

## 2021-05-23 DIAGNOSIS — D849 Immunodeficiency, unspecified: Secondary | ICD-10-CM | POA: Diagnosis present

## 2021-05-23 DIAGNOSIS — Z794 Long term (current) use of insulin: Secondary | ICD-10-CM | POA: Diagnosis not present

## 2021-05-23 DIAGNOSIS — I2510 Atherosclerotic heart disease of native coronary artery without angina pectoris: Secondary | ICD-10-CM | POA: Diagnosis not present

## 2021-05-23 DIAGNOSIS — R6520 Severe sepsis without septic shock: Secondary | ICD-10-CM | POA: Diagnosis not present

## 2021-05-23 DIAGNOSIS — Z7901 Long term (current) use of anticoagulants: Secondary | ICD-10-CM | POA: Diagnosis not present

## 2021-05-23 DIAGNOSIS — E119 Type 2 diabetes mellitus without complications: Secondary | ICD-10-CM

## 2021-05-23 DIAGNOSIS — A419 Sepsis, unspecified organism: Secondary | ICD-10-CM | POA: Diagnosis present

## 2021-05-23 DIAGNOSIS — N390 Urinary tract infection, site not specified: Secondary | ICD-10-CM | POA: Diagnosis present

## 2021-05-23 DIAGNOSIS — R339 Retention of urine, unspecified: Secondary | ICD-10-CM | POA: Diagnosis present

## 2021-05-23 DIAGNOSIS — I129 Hypertensive chronic kidney disease with stage 1 through stage 4 chronic kidney disease, or unspecified chronic kidney disease: Secondary | ICD-10-CM | POA: Diagnosis present

## 2021-05-23 DIAGNOSIS — R5381 Other malaise: Secondary | ICD-10-CM | POA: Diagnosis present

## 2021-05-23 DIAGNOSIS — N1831 Chronic kidney disease, stage 3a: Secondary | ICD-10-CM | POA: Diagnosis present

## 2021-05-23 DIAGNOSIS — G9341 Metabolic encephalopathy: Secondary | ICD-10-CM | POA: Diagnosis not present

## 2021-05-23 DIAGNOSIS — N136 Pyonephrosis: Secondary | ICD-10-CM | POA: Diagnosis present

## 2021-05-23 DIAGNOSIS — N400 Enlarged prostate without lower urinary tract symptoms: Secondary | ICD-10-CM | POA: Diagnosis present

## 2021-05-23 DIAGNOSIS — I517 Cardiomegaly: Secondary | ICD-10-CM | POA: Diagnosis not present

## 2021-05-23 DIAGNOSIS — Z0189 Encounter for other specified special examinations: Secondary | ICD-10-CM | POA: Diagnosis not present

## 2021-05-23 LAB — CBC WITH DIFFERENTIAL/PLATELET
Abs Immature Granulocytes: 0.04 10*3/uL (ref 0.00–0.07)
Basophils Absolute: 0 10*3/uL (ref 0.0–0.1)
Basophils Relative: 0 %
Eosinophils Absolute: 0 10*3/uL (ref 0.0–0.5)
Eosinophils Relative: 0 %
HCT: 32.8 % — ABNORMAL LOW (ref 39.0–52.0)
Hemoglobin: 10.4 g/dL — ABNORMAL LOW (ref 13.0–17.0)
Immature Granulocytes: 1 %
Lymphocytes Relative: 23 %
Lymphs Abs: 1.2 10*3/uL (ref 0.7–4.0)
MCH: 25.8 pg — ABNORMAL LOW (ref 26.0–34.0)
MCHC: 31.7 g/dL (ref 30.0–36.0)
MCV: 81.4 fL (ref 80.0–100.0)
Monocytes Absolute: 1.3 10*3/uL — ABNORMAL HIGH (ref 0.1–1.0)
Monocytes Relative: 26 %
Neutro Abs: 2.7 10*3/uL (ref 1.7–7.7)
Neutrophils Relative %: 50 %
Platelets: 154 10*3/uL (ref 150–400)
RBC: 4.03 MIL/uL — ABNORMAL LOW (ref 4.22–5.81)
RDW: 17.2 % — ABNORMAL HIGH (ref 11.5–15.5)
WBC: 5.2 10*3/uL (ref 4.0–10.5)
nRBC: 0 % (ref 0.0–0.2)

## 2021-05-23 LAB — COMPREHENSIVE METABOLIC PANEL
ALT: 23 U/L (ref 0–44)
AST: 16 U/L (ref 15–41)
Albumin: 3.6 g/dL (ref 3.5–5.0)
Alkaline Phosphatase: 94 U/L (ref 38–126)
Anion gap: 8 (ref 5–15)
BUN: 21 mg/dL (ref 8–23)
CO2: 24 mmol/L (ref 22–32)
Calcium: 8.7 mg/dL — ABNORMAL LOW (ref 8.9–10.3)
Chloride: 103 mmol/L (ref 98–111)
Creatinine, Ser: 1.73 mg/dL — ABNORMAL HIGH (ref 0.61–1.24)
GFR, Estimated: 41 mL/min — ABNORMAL LOW (ref >60)
Glucose, Bld: 190 mg/dL — ABNORMAL HIGH (ref 70–99)
Potassium: 3.9 mmol/L (ref 3.5–5.1)
Sodium: 135 mmol/L (ref 135–145)
Total Bilirubin: 1.4 mg/dL — ABNORMAL HIGH (ref 0.3–1.2)
Total Protein: 6.1 g/dL — ABNORMAL LOW (ref 6.5–8.1)

## 2021-05-23 LAB — RESP PANEL BY RT-PCR (FLU A&B, COVID) ARPGX2
Influenza A by PCR: NEGATIVE
Influenza B by PCR: NEGATIVE
SARS Coronavirus 2 by RT PCR: NEGATIVE

## 2021-05-23 LAB — URINALYSIS, COMPLETE (UACMP) WITH MICROSCOPIC
Bilirubin Urine: NEGATIVE
Glucose, UA: NEGATIVE mg/dL
Ketones, ur: NEGATIVE mg/dL
Nitrite: POSITIVE — AB
Protein, ur: 100 mg/dL — AB
RBC / HPF: >50 RBC/hpf — ABNORMAL HIGH (ref 0–5)
Specific Gravity, Urine: 1.012 (ref 1.005–1.030)
WBC, UA: >50 WBC/hpf — ABNORMAL HIGH (ref 0–5)
pH: 6 (ref 5.0–8.0)

## 2021-05-23 LAB — HEMOGLOBIN A1C
Hgb A1c MFr Bld: 8.1 % — ABNORMAL HIGH (ref 4.8–5.6)
Mean Plasma Glucose: 185.77 mg/dL

## 2021-05-23 LAB — PROTIME-INR
INR: 1.7 — ABNORMAL HIGH (ref 0.8–1.2)
Prothrombin Time: 20 seconds — ABNORMAL HIGH (ref 11.4–15.2)

## 2021-05-23 LAB — PHOSPHORUS: Phosphorus: 3 mg/dL (ref 2.5–4.6)

## 2021-05-23 LAB — PROCALCITONIN: Procalcitonin: 0.49 ng/mL

## 2021-05-23 LAB — CK: Total CK: 37 U/L — ABNORMAL LOW (ref 49–397)

## 2021-05-23 LAB — MAGNESIUM: Magnesium: 1.6 mg/dL — ABNORMAL LOW (ref 1.7–2.4)

## 2021-05-23 LAB — LACTIC ACID, PLASMA: Lactic Acid, Venous: 1.4 mmol/L (ref 0.5–1.9)

## 2021-05-23 LAB — CBG MONITORING, ED: Glucose-Capillary: 166 mg/dL — ABNORMAL HIGH (ref 70–99)

## 2021-05-23 LAB — APTT: aPTT: 35 seconds (ref 24–36)

## 2021-05-23 LAB — MRSA NEXT GEN BY PCR, NASAL: MRSA by PCR Next Gen: NOT DETECTED

## 2021-05-23 LAB — GLUCOSE, CAPILLARY: Glucose-Capillary: 193 mg/dL — ABNORMAL HIGH (ref 70–99)

## 2021-05-23 MED ORDER — LACTATED RINGERS IV BOLUS (SEPSIS)
1000.0000 mL | Freq: Once | INTRAVENOUS | Status: AC
  Administered 2021-05-23: 1000 mL via INTRAVENOUS

## 2021-05-23 MED ORDER — FINASTERIDE 5 MG PO TABS
5.0000 mg | ORAL_TABLET | Freq: Every day | ORAL | Status: DC
  Administered 2021-05-23 – 2021-05-31 (×9): 5 mg via ORAL
  Filled 2021-05-23 (×9): qty 1

## 2021-05-23 MED ORDER — INSULIN ASPART 100 UNIT/ML IJ SOLN
0.0000 [IU] | INTRAMUSCULAR | Status: DC
  Administered 2021-05-23 – 2021-05-24 (×3): 2 [IU] via SUBCUTANEOUS
  Administered 2021-05-24: 3 [IU] via SUBCUTANEOUS
  Administered 2021-05-24: 2 [IU] via SUBCUTANEOUS
  Administered 2021-05-24: 3 [IU] via SUBCUTANEOUS
  Administered 2021-05-24: 2 [IU] via SUBCUTANEOUS
  Administered 2021-05-25: 1 [IU] via SUBCUTANEOUS
  Administered 2021-05-25: 2 [IU] via SUBCUTANEOUS
  Administered 2021-05-25: 3 [IU] via SUBCUTANEOUS
  Administered 2021-05-25: 2 [IU] via SUBCUTANEOUS
  Administered 2021-05-25: 5 [IU] via SUBCUTANEOUS
  Administered 2021-05-25: 2 [IU] via SUBCUTANEOUS
  Administered 2021-05-26: 3 [IU] via SUBCUTANEOUS
  Administered 2021-05-26: 2 [IU] via SUBCUTANEOUS
  Administered 2021-05-26: 3 [IU] via SUBCUTANEOUS
  Administered 2021-05-26: 2 [IU] via SUBCUTANEOUS
  Administered 2021-05-26: 5 [IU] via SUBCUTANEOUS
  Administered 2021-05-27: 3 [IU] via SUBCUTANEOUS
  Administered 2021-05-27: 1 [IU] via SUBCUTANEOUS
  Administered 2021-05-27: 2 [IU] via SUBCUTANEOUS
  Administered 2021-05-27: 5 [IU] via SUBCUTANEOUS
  Administered 2021-05-27: 3 [IU] via SUBCUTANEOUS
  Administered 2021-05-28 (×3): 5 [IU] via SUBCUTANEOUS
  Administered 2021-05-28: 2 [IU] via SUBCUTANEOUS
  Administered 2021-05-28: 1 [IU] via SUBCUTANEOUS
  Administered 2021-05-28: 2 [IU] via SUBCUTANEOUS
  Administered 2021-05-29: 7 [IU] via SUBCUTANEOUS
  Administered 2021-05-29: 9 [IU] via SUBCUTANEOUS
  Administered 2021-05-29: 5 [IU] via SUBCUTANEOUS
  Administered 2021-05-29: 1 [IU] via SUBCUTANEOUS
  Administered 2021-05-29: 3 [IU] via SUBCUTANEOUS
  Administered 2021-05-30: 2 [IU] via SUBCUTANEOUS
  Administered 2021-05-30: 3 [IU] via SUBCUTANEOUS
  Administered 2021-05-30 (×2): 5 [IU] via SUBCUTANEOUS
  Filled 2021-05-23: qty 0.09

## 2021-05-23 MED ORDER — SODIUM CHLORIDE 0.9 % IV SOLN
75.0000 mL/h | INTRAVENOUS | Status: AC
  Administered 2021-05-23: 75 mL/h via INTRAVENOUS

## 2021-05-23 MED ORDER — MAGNESIUM SULFATE IN D5W 1-5 GM/100ML-% IV SOLN
1.0000 g | Freq: Once | INTRAVENOUS | Status: AC
  Administered 2021-05-23: 1 g via INTRAVENOUS
  Filled 2021-05-23 (×2): qty 100

## 2021-05-23 MED ORDER — INSULIN GLARGINE 100 UNITS/ML SOLOSTAR PEN
25.0000 [IU] | PEN_INJECTOR | Freq: Every day | SUBCUTANEOUS | Status: DC
  Filled 2021-05-23: qty 3

## 2021-05-23 MED ORDER — ACETAMINOPHEN 325 MG PO TABS
650.0000 mg | ORAL_TABLET | Freq: Four times a day (QID) | ORAL | Status: DC | PRN
  Administered 2021-05-23: 650 mg via ORAL
  Filled 2021-05-23: qty 2

## 2021-05-23 MED ORDER — ACETAMINOPHEN 650 MG RE SUPP: 650.0000 mg | Freq: Four times a day (QID) | RECTAL | Status: DC | PRN

## 2021-05-23 MED ORDER — HYDROCODONE-ACETAMINOPHEN 5-325 MG PO TABS: 1.0000 | ORAL_TABLET | ORAL | Status: DC | PRN

## 2021-05-23 MED ORDER — ATORVASTATIN CALCIUM 20 MG PO TABS
20.0000 mg | ORAL_TABLET | Freq: Every day | ORAL | Status: DC
  Administered 2021-05-23 – 2021-05-31 (×9): 20 mg via ORAL
  Filled 2021-05-23 (×9): qty 1

## 2021-05-23 MED ORDER — CLOPIDOGREL BISULFATE 75 MG PO TABS
75.0000 mg | ORAL_TABLET | Freq: Every day | ORAL | Status: DC
  Administered 2021-05-24 – 2021-06-01 (×9): 75 mg via ORAL
  Filled 2021-05-23 (×9): qty 1

## 2021-05-23 MED ORDER — LACTATED RINGERS IV BOLUS (SEPSIS)
500.0000 mL | Freq: Once | INTRAVENOUS | Status: AC
  Administered 2021-05-23: 500 mL via INTRAVENOUS

## 2021-05-23 MED ORDER — SODIUM CHLORIDE 0.9 % IV SOLN
2.0000 g | Freq: Two times a day (BID) | INTRAVENOUS | Status: DC
  Administered 2021-05-23 – 2021-05-27 (×8): 2 g via INTRAVENOUS
  Filled 2021-05-23 (×9): qty 2

## 2021-05-23 MED ORDER — ACETAMINOPHEN 500 MG PO TABS
1000.0000 mg | ORAL_TABLET | Freq: Once | ORAL | Status: AC
  Administered 2021-05-23: 1000 mg via ORAL
  Filled 2021-05-23: qty 2

## 2021-05-23 MED ORDER — AMIODARONE HCL 200 MG PO TABS
200.0000 mg | ORAL_TABLET | Freq: Every morning | ORAL | Status: DC
  Administered 2021-05-24 – 2021-06-01 (×9): 200 mg via ORAL
  Filled 2021-05-23 (×8): qty 1

## 2021-05-23 MED ORDER — TAMSULOSIN HCL 0.4 MG PO CAPS
0.4000 mg | ORAL_CAPSULE | Freq: Every day | ORAL | Status: DC
  Administered 2021-05-24 – 2021-05-31 (×8): 0.4 mg via ORAL
  Filled 2021-05-23 (×8): qty 1

## 2021-05-23 MED ORDER — INSULIN GLARGINE 100 UNIT/ML ~~LOC~~ SOLN
25.0000 [IU] | Freq: Every day | SUBCUTANEOUS | Status: DC
  Administered 2021-05-23 – 2021-05-25 (×3): 25 [IU] via SUBCUTANEOUS
  Filled 2021-05-23 (×3): qty 0.25

## 2021-05-23 MED ORDER — METOPROLOL TARTRATE 50 MG PO TABS
50.0000 mg | ORAL_TABLET | Freq: Two times a day (BID) | ORAL | Status: DC
  Administered 2021-05-23 – 2021-06-01 (×16): 50 mg via ORAL
  Filled 2021-05-23 (×18): qty 1

## 2021-05-23 MED ORDER — APIXABAN 5 MG PO TABS
5.0000 mg | ORAL_TABLET | Freq: Two times a day (BID) | ORAL | Status: DC
  Administered 2021-05-25 – 2021-06-01 (×16): 5 mg via ORAL
  Filled 2021-05-23 (×16): qty 1

## 2021-05-23 MED ORDER — SODIUM CHLORIDE 0.9 % IV SOLN
1.0000 g | INTRAVENOUS | Status: DC
  Administered 2021-05-23: 1 g via INTRAVENOUS
  Filled 2021-05-23: qty 10

## 2021-05-23 MED ORDER — CEFTRIAXONE SODIUM 2 G IJ SOLR
2.0000 g | INTRAMUSCULAR | Status: DC
Start: 2021-05-24 — End: 2021-05-23

## 2021-05-23 NOTE — ED Provider Notes (Signed)
[facility] DEPT Provider Note   CSN: [id number] Arrival date & time: 05/23/21  1444     History No chief complaint on file.   [name] is a 75 y.o. male.  Pt recently had a uti.  Today pt has a fever and is urinating frequently    The history is provided by the patient and the spouse. No language interpreter was used.  Urinary Frequency This is a new problem. The current episode started yesterday. The problem occurs constantly. The problem has been gradually worsening. Pertinent negatives include no abdominal pain. Nothing aggravates the symptoms. Nothing relieves the symptoms. He has tried nothing for the symptoms. The treatment provided no relief.      Past Medical History:  Diagnosis Date   Anginal pain ([place])    Coronary artery disease    Diabetes mellitus without complication ([place])    Dysrhythmia    Hypertension    Sleep apnea     Patient Active Problem List   Diagnosis Date Noted   Complicated UTI (urinary tract infection) 05/06/2021   Sleep apnea    Hyperlipidemia associated with type 2 diabetes mellitus ([place])    Hydronephrosis of right kidney 05/01/2021   Acute kidney injury superimposed on CKD ([place]) 05/01/2021   CAD (coronary artery disease) 05/01/2021   DM2 (diabetes mellitus, type 2) ([place]) 05/01/2021   PAF (paroxysmal atrial fibrillation) ([place]) 05/01/2021   Hypertension associated with diabetes ([place]) 05/01/2021   Lymphadenopathy 04/30/2021    Past Surgical History:  Procedure Laterality Date   CARDIAC CATHETERIZATION     CYSTOSCOPY WITH RETROGRADE PYELOGRAM, URETEROSCOPY AND STENT PLACEMENT Right 05/01/2021   Procedure: CYSTOSCOPY WITH RIGHT  RETROGRADE PYELOGRAM,  AND RIGHT STENT PLACEMENT;  Surgeon: Festus Aloe, MD;  Location: [facility];  Service: Urology;  Laterality: Right;   EYE SURGERY     INJECTION OF SILICONE OIL Left 32/99/2426   Procedure: INJECTION OF SILICONE OIL;  Surgeon: Sherlynn Stalls, MD;   Location: [place];  Service: Ophthalmology;  Laterality: Left;   INJECTION OF SILICONE OIL Left 8/34/1962   Procedure: INJECTION OF SILICONE OIL;  Surgeon: Sherlynn Stalls, MD;  Location: [place];  Service: Ophthalmology;  Laterality: Left;   JOINT REPLACEMENT Right    hip   LASER PHOTO ABLATION Left 12/25/2020   Procedure: LASER PHOTO ABLATION;  Surgeon: Sherlynn Stalls, MD;  Location: [place];  Service: Ophthalmology;  Laterality: Left;   PARS PLANA VITRECTOMY Left 11/10/2020   Procedure: PARS PLANA VITRECTOMY WITH 25 GAUGE;  Surgeon: Sherlynn Stalls, MD;  Location: [place];  Service: Ophthalmology;  Laterality: Left;   PARS PLANA VITRECTOMY Left 12/25/2020   Procedure: PARS PLANA VITRECTOMY WITH 25 GAUGE IN LEFT EYE;  Surgeon: Sherlynn Stalls, MD;  Location: [place];  Service: Ophthalmology;  Laterality: Left;   PERFLUORONE INJECTION Left 12/25/2020   Procedure: PERFLUORONE INJECTION;  Surgeon: Sherlynn Stalls, MD;  Location: [place];  Service: Ophthalmology;  Laterality: Left;   PHOTOCOAGULATION WITH LASER Left 11/10/2020   Procedure: PHOTOCOAGULATION WITH LASER;  Surgeon: Sherlynn Stalls, MD;  Location: [place];  Service: Ophthalmology;  Laterality: Left;   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   Procedure: RETINECTOMY LEFT EYE;  Surgeon: Sherlynn Stalls, MD;  Location: [place];  Service: Ophthalmology;  Laterality: Left;   SILICON OIL REMOVAL Left 2/29/7989   Procedure: SILICONE OIL REMOVAL;  Surgeon: Sherlynn Stalls, MD;  Location: [place];  Service: Ophthalmology;  Laterality: Left;       Family  History  Problem Relation Age of Onset   CAD Other     Social History   Tobacco Use   Smoking status: Never   Smokeless tobacco: Never  Vaping Use   Vaping Use: Never used  Substance Use Topics   Alcohol use: Not Currently   Drug use: Never    Home Medications Prior to Admission medications   Medication Sig Start Date End Date Taking? Authorizing Provider  acetaminophen (TYLENOL)  650 MG CR tablet Take 650 mg by mouth every 8 (eight) hours as needed for pain or fever (headache).   Yes [provider]  amiodarone (PACERONE) 200 MG tablet Take 200 mg by mouth every morning. 04/02/21  Yes [provider]  amLODipine (NORVASC) 10 MG tablet Take 1 tablet (10 mg total) by mouth daily. Patient taking differently: Take 10 mg by mouth every morning. 05/05/21  Yes Adhikari, Amrit, MD  apixaban (ELIQUIS) 5 MG TABS tablet Take 5 mg by mouth 2 (two) times daily.   Yes [provider]  atorvastatin (LIPITOR) 20 MG tablet Take 20 mg by mouth at bedtime. 11/04/20  Yes [provider]  clopidogrel (PLAVIX) 75 MG tablet Take 75 mg by mouth every morning. 12/17/20  Yes [provider]  finasteride (PROSCAR) 5 MG tablet Take 5 mg by mouth at bedtime. 05/14/21  Yes [provider]  glipiZIDE (GLUCOTROL) 10 MG tablet Take 10 mg by mouth 2 (two) times daily. 05/15/21  Yes [provider]  insulin glargine (LANTUS) 100 unit/mL SOPN Inject 34 Units into the skin at bedtime.   Yes [provider]  isosorbide mononitrate (IMDUR) 30 MG 24 hr tablet Take 30 mg by mouth every morning. 09/08/20  Yes [provider]  metFORMIN (GLUCOPHAGE) 1000 MG tablet Take 1,000 mg by mouth 2 (two) times daily. 05/15/21  Yes [provider]  metoprolol tartrate (LOPRESSOR) 50 MG tablet Take 1 tablet (50 mg total) by mouth 2 (two) times daily. 05/05/21 06/04/21 Yes Shelly Coss, MD  nitroGLYCERIN (NITROSTAT) 0.3 MG SL tablet Place 0.3 mg under the tongue every 5 (five) minutes x 3 doses as needed for chest pain. 09/08/20  Yes [provider]  ofloxacin (OCUFLOX) 0.3 % ophthalmic solution Place 1 drop into the left eye 2 (two) times daily.   Yes [provider]  silodosin (RAPAFLO) 8 MG CAPS capsule Take 8 mg by mouth at bedtime. 05/15/21  Yes [provider]    Allergies    Patient has no known allergies.  Review  of Systems   Review of Systems  Gastrointestinal:  Negative for abdominal pain.  Genitourinary:  Positive for frequency.  All other systems reviewed and are negative.  Physical Exam Updated Vital Signs BP 139/77   Pulse 83   Temp (!) 102.9 F (39.4 C) (Oral)   Resp (!) 33   SpO2 94%   Physical Exam Vitals and nursing note reviewed.  Constitutional:      Appearance: He is well-developed.  HENT:     Head: Normocephalic and atraumatic.     Mouth/Throat:     Mouth: Mucous membranes are moist.  Eyes:     Conjunctiva/sclera: Conjunctivae normal.  Cardiovascular:     Rate and Rhythm: Tachycardia present.     Heart sounds: No murmur heard. Pulmonary:     Effort: Pulmonary effort is normal. No respiratory distress.     Breath sounds: Normal breath sounds.  Abdominal:     Palpations: Abdomen is soft.  Tenderness: There is no abdominal tenderness.  Musculoskeletal:        General: Normal range of motion.     Cervical back: Neck supple.  Skin:    General: Skin is warm and dry.  Neurological:     Mental Status: He is alert and oriented to person, place, and time.  Psychiatric:        Mood and Affect: Mood normal.    ED Results / Procedures / Treatments   Labs (all labs ordered are listed, but only abnormal results are displayed) Labs Reviewed  CBC WITH DIFFERENTIAL/PLATELET - Abnormal; Notable for the following components:      Result Value   RBC 4.03 (*)    Hemoglobin 10.4 (*)    HCT 32.8 (*)    MCH 25.8 (*)    RDW 17.2 (*)    Monocytes Absolute 1.3 (*)    All other components within normal limits  COMPREHENSIVE METABOLIC PANEL - Abnormal; Notable for the following components:   Glucose, Bld 190 (*)    Creatinine, Ser 1.73 (*)    Calcium 8.7 (*)    Total Protein 6.1 (*)    Total Bilirubin 1.4 (*)    GFR, Estimated 41 (*)    All other components within normal limits  PROTIME-INR - Abnormal; Notable for the following components:   Prothrombin Time 20.0 (*)     INR 1.7 (*)    All other components within normal limits  URINALYSIS, COMPLETE (UACMP) WITH MICROSCOPIC - Abnormal; Notable for the following components:   APPearance HAZY (*)    Hgb urine dipstick LARGE (*)    Protein, ur 100 (*)    Nitrite POSITIVE (*)    Leukocytes,Ua LARGE (*)    RBC / HPF >50 (*)    WBC, UA >50 (*)    Bacteria, UA MANY (*)    All other components within normal limits  RESP PANEL BY RT-PCR (FLU A&B, COVID) ARPGX2  CULTURE, BLOOD (ROUTINE X 2)  CULTURE, BLOOD (ROUTINE X 2)  LACTIC ACID, PLASMA  APTT  LACTIC ACID, PLASMA    EKG None  Radiology DG Chest Port 1 View  Result Date: 05/23/2021 CLINICAL DATA:  Fever EXAM: PORTABLE CHEST 1 VIEW COMPARISON:  05/06/2021 FINDINGS: Low volume AP portable examination with mild, diffuse bilateral interstitial opacity and heterogeneous opacity in the retrocardiac left lung base. Cardiomegaly. The visualized skeletal structures are unremarkable. IMPRESSION: 1. Low volume AP portable examination with mild, diffuse bilateral interstitial opacity and heterogeneous opacity in the retrocardiac left lung base, which may reflect atelectasis in the setting of cardiomegaly or alternately infection/aspiration. 2. Cardiomegaly. Electronically Signed   By: Eddie Candle M.D.   On: 05/23/2021 15:49    Procedures .Critical Care  Date/Time: 05/23/2021 5:17 PM Performed by: Fransico Meadow, PA-C Authorized by: Fransico Meadow, PA-C   Critical care provider statement:    Critical care time (minutes):  45   Critical care start time:  05/23/2021 3:00 PM   Critical care end time:  05/23/2021 5:17 PM   Critical care was necessary to treat or prevent imminent or life-threatening deterioration of the following conditions:  Dehydration and sepsis   Critical care was time spent personally by me on the following activities:  Discussions with consultants, evaluation of patient's response to treatment, examination of patient, ordering and performing  treatments and interventions, ordering and review of laboratory studies, ordering and review of radiographic studies, pulse oximetry, re-evaluation of patient's condition, obtaining history from patient or  surrogate and review of old charts   Care discussed with: admitting provider     Medications Ordered in ED Medications  cefTRIAXone (ROCEPHIN) 2 g in sodium chloride 0.9 % 100 mL IVPB (has no administration in time range)  lactated ringers bolus 1,000 mL (0 mLs Intravenous Stopped 05/23/21 1646)    And  lactated ringers bolus 1,000 mL (0 mLs Intravenous Stopped 05/23/21 1658)    And  lactated ringers bolus 1,000 mL (0 mLs Intravenous Stopped 05/23/21 1658)    And  lactated ringers bolus 500 mL (500 mLs Intravenous New Bag/Given 05/23/21 1547)  acetaminophen (TYLENOL) tablet 1,000 mg (1,000 mg Oral Given 05/23/21 1549)    ED Course  I have reviewed the triage vital signs and the nursing notes.  Pertinent labs & imaging results that were available during my care of the patient were reviewed by me and considered in my medical decision making (see chart for details).    MDM Rules/Calculators/A&P                          MDM: Pt given IV bolus x 3 liters, Rocephin 1 gram Pt placed on 2 liters of 02.  I spoke to Dr. Marvis Repress who will admit I spoke to Dr. Junious Silk who advised foley catheter, ct renal.  Sepsis treatment   Final Clinical Impression(s) / ED Diagnoses Final diagnoses:  Sepsis, due to unspecified organism, unspecified whether acute organ dysfunction present [facility])    Rx / DC Orders ED Discharge Orders     None        Sidney Ace 05/23/21 2117    Lacretia Leigh, MD 05/24/21 1918

## 2021-05-23 NOTE — Consult Note (Addendum)
Consultation: Sepsis presumed urinary source Requested by: Dr. Toy Baker  History of Present Illness: Pt with Diffuse Large B Cell Lymphoma presented with RLQ abdominal pain. CT abdomen/pelvis shows pathologic mesenteric, retroperitoneal, and pelvic adenopathy, as described above. Moderate splenomegaly. Lobulated retroperitoneal soft tissue masses in the region of the right ureteropelvic junction likely resulting in mild right hydronephrosis secondary to extrinsic mass effect as well as within the right retroperitoneum. He was seen by Dr. [name] with GU and underwent cysto right stent 05/01/2021. A 05/04/2021 needle core biopsy of right retroperitoneal lymph node shows large B cell lymphoma.   He also has h/o BPH with urgency and postvoid dribbling and decreased force of stream. Dr. [name] placed pt on silodosin and finasteride when he saw him in office 05/14/2021.   He presented last night with weakness, many bacteria on UA and fever. CT shows no hydro, right stent in good position, foley in place, bladder drained, no prostate abscess. Cr at baseline 1.7 and looks good.  He feels better this morning.  Creatinine 1.69 which is excellent for him.  Past Medical History:  Diagnosis Date   Anginal pain ([place])    Coronary artery disease    Diabetes mellitus without complication ([place])    Dysrhythmia    Hypertension    Sleep apnea    Past Surgical History:  Procedure Laterality Date   CARDIAC CATHETERIZATION     CYSTOSCOPY WITH RETROGRADE PYELOGRAM, URETEROSCOPY AND STENT PLACEMENT Right 05/01/2021   Procedure: CYSTOSCOPY WITH RIGHT  RETROGRADE PYELOGRAM,  AND RIGHT STENT PLACEMENT;  Surgeon: Festus Aloe, MD;  Location: [facility];  Service: Urology;  Laterality: Right;   EYE SURGERY     INJECTION OF SILICONE OIL Left 38/18/2993   Procedure: INJECTION OF SILICONE OIL;  Surgeon: Sherlynn Stalls, MD;  Location: [facility];  Service: Ophthalmology;  Laterality: Left;   INJECTION OF SILICONE OIL  Left 05/30/9677   Procedure: INJECTION OF SILICONE OIL;  Surgeon: Sherlynn Stalls, MD;  Location: [facility];  Service: Ophthalmology;  Laterality: Left;   JOINT REPLACEMENT Right    hip   LASER PHOTO ABLATION Left 12/25/2020   Procedure: LASER PHOTO ABLATION;  Surgeon: Sherlynn Stalls, MD;  Location: [facility];  Service: Ophthalmology;  Laterality: Left;   PARS PLANA VITRECTOMY Left 11/10/2020   Procedure: PARS PLANA VITRECTOMY WITH 25 GAUGE;  Surgeon: Sherlynn Stalls, MD;  Location: [facility];  Service: Ophthalmology;  Laterality: Left;   PARS PLANA VITRECTOMY Left 12/25/2020   Procedure: PARS PLANA VITRECTOMY WITH 25 GAUGE IN LEFT EYE;  Surgeon: Sherlynn Stalls, MD;  Location: [facility];  Service: Ophthalmology;  Laterality: Left;   PERFLUORONE INJECTION Left 12/25/2020   Procedure: PERFLUORONE INJECTION;  Surgeon: Sherlynn Stalls, MD;  Location: [facility];  Service: Ophthalmology;  Laterality: Left;   PHOTOCOAGULATION WITH LASER Left 11/10/2020   Procedure: PHOTOCOAGULATION WITH LASER;  Surgeon: Sherlynn Stalls, MD;  Location: [facility];  Service: Ophthalmology;  Laterality: Left;   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   05/22/2021 at pm   amiodarone (PACERONE) 200 MG tablet Take 200 mg by mouth every morning.   05/22/2021 at am   amLODipine (NORVASC) 10 MG tablet Take 1 tablet (10 mg total) by mouth daily. (Patient taking differently: Take 10 mg by mouth every morning.) 30 tablet 1 05/22/2021 at am    apixaban (ELIQUIS) 5 MG TABS tablet Take 5 mg by mouth 2 (two) times daily.   05/22/2021 at 10pm   atorvastatin (LIPITOR) 20 MG tablet Take 20 mg by mouth at bedtime.   05/22/2021 at pm   clopidogrel (PLAVIX) 75 MG tablet Take 75 mg by mouth every morning.   05/22/2021 at am   finasteride (PROSCAR) 5 MG tablet Take 5 mg by mouth at bedtime.   05/22/2021 at pm   glipiZIDE (GLUCOTROL) 10 MG tablet Take 10 mg by mouth 2 (two) times daily.   05/22/2021 at pm   insulin glargine (LANTUS) 100 unit/mL SOPN Inject 34 Units into the skin at bedtime.   05/22/2021 at pm   isosorbide mononitrate (IMDUR) 30 MG 24 hr tablet Take 30 mg by mouth every morning.   05/22/2021 at am   metFORMIN (GLUCOPHAGE) 1000 MG tablet Take 1,000 mg by mouth 2 (two) times daily.   05/22/2021 at pm   metoprolol tartrate (LOPRESSOR) 50 MG tablet Take 1 tablet (50 mg total) by mouth 2 (two) times daily. 60 tablet 1 05/22/2021 at 10pm   nitroGLYCERIN (NITROSTAT) 0.3 MG SL tablet Place 0.3 mg under the tongue every 5 (five) minutes x 3 doses as needed for chest pain.   unknown   ofloxacin (OCUFLOX) 0.3 % ophthalmic solution Place 1 drop into the left eye 2 (two) times daily.   05/22/2021 at pm   silodosin (RAPAFLO) 8 MG CAPS capsule Take 8 mg by mouth at bedtime.   05/22/2021 at pm   Allergies: No Known Allergies  Family History  Problem Relation Age of Onset   CAD Other    Social History:  reports that he has never smoked. He has never used smokeless tobacco. He reports previous alcohol use. He reports that he does not use drugs.  ROS: A complete review of systems was performed.  All systems are negative except for pertinent findings as noted. Review of Systems  Respiratory:  Positive for cough.    Physical Exam:  Vital signs in last 24 hours: Temp:  [98 F (36.7 C)-103.1 F (39.5 C)] 98.4 F (36.9 C) (07/10 3790) Pulse Rate:  [65-99] 84 (07/10 0613) Resp:  [18-38] 20 (07/10 0613) BP: (119-175)/(62-141) 149/73 (07/10 0611) SpO2:  [91 %-99 %]  95 % (07/10 2409)   Intake/Output Summary (Last 24 hours) at 05/24/2021 1128 Last data filed at 05/24/2021 0408 Gross per 24 hour  Intake 4630.48 ml  Output 800 ml  Net 3830.48 ml      General:  Alert and oriented, No acute distress-he called Alden Benjamin who was on the phone and interpreter HEENT: Normocephalic, atraumatic Cardiovascular: Regular rate and rhythm Lungs: Regular rate and effort Abdomen: Soft, nontender, nondistended, no abdominal masses Back: No CVA tenderness Extremities: No edema Neurologic: Grossly intact GU: Foley catheter in place.  Urine clear with some old clot or sediment.  Laboratory Data:  Results for orders placed or performed during the hospital encounter of 05/23/21 (from the past 24 hour(s))  CBC with Differential     Status: Abnormal   Collection Time: 05/23/21  3:40 PM  Result Value Ref Range  WBC 5.2 4.0 - 10.5 K/uL   RBC 4.03 (L) 4.22 - 5.81 MIL/uL   Hemoglobin 10.4 (L) 13.0 - 17.0 g/dL   HCT 32.8 (L) 39.0 - 52.0 %   MCV 81.4 80.0 - 100.0 fL   MCH 25.8 (L) 26.0 - 34.0 pg   MCHC 31.7 30.0 - 36.0 g/dL   RDW 17.2 (H) 11.5 - 15.5 %   Platelets 154 150 - 400 K/uL   nRBC 0.0 0.0 - 0.2 %   Neutrophils Relative % 50 %   Neutro Abs 2.7 1.7 - 7.7 K/uL   Lymphocytes Relative 23 %   Lymphs Abs 1.2 0.7 - 4.0 K/uL   Monocytes Relative 26 %   Monocytes Absolute 1.3 (H) 0.1 - 1.0 K/uL   Eosinophils Relative 0 %   Eosinophils Absolute 0.0 0.0 - 0.5 K/uL   Basophils Relative 0 %   Basophils Absolute 0.0 0.0 - 0.1 K/uL   Immature Granulocytes 1 %   Abs Immature Granulocytes 0.04 0.00 - 0.07 K/uL  Comprehensive metabolic panel     Status: Abnormal   Collection Time: 05/23/21  3:40 PM  Result Value Ref Range   Sodium 135 135 - 145 mmol/L   Potassium 3.9 3.5 - 5.1 mmol/L   Chloride 103 98 - 111 mmol/L   CO2 24 22 - 32 mmol/L   Glucose, Bld 190 (H) 70 - 99 mg/dL   BUN 21 8 - 23 mg/dL   Creatinine, Ser 1.73 (H) 0.61 - 1.24 mg/dL   Calcium 8.7 (L) 8.9 -  10.3 mg/dL   Total Protein 6.1 (L) 6.5 - 8.1 g/dL   Albumin 3.6 3.5 - 5.0 g/dL   AST 16 15 - 41 U/L   ALT 23 0 - 44 U/L   Alkaline Phosphatase 94 38 - 126 U/L   Total Bilirubin 1.4 (H) 0.3 - 1.2 mg/dL   GFR, Estimated 41 (L) >60 mL/min   Anion gap 8 5 - 15  Lactic acid, plasma     Status: None   Collection Time: 05/23/21  3:40 PM  Result Value Ref Range   Lactic Acid, Venous 1.4 0.5 - 1.9 mmol/L  Protime-INR     Status: Abnormal   Collection Time: 05/23/21  3:40 PM  Result Value Ref Range   Prothrombin Time 20.0 (H) 11.4 - 15.2 seconds   INR 1.7 (H) 0.8 - 1.2  APTT     Status: None   Collection Time: 05/23/21  3:40 PM  Result Value Ref Range   aPTT 35 24 - 36 seconds  Hemoglobin A1c     Status: Abnormal   Collection Time: 05/23/21  3:40 PM  Result Value Ref Range   Hgb A1c MFr Bld 8.1 (H) 4.8 - 5.6 %   Mean Plasma Glucose 185.77 mg/dL  Procalcitonin     Status: None   Collection Time: 05/23/21  3:40 PM  Result Value Ref Range   Procalcitonin 0.49 ng/mL  Urinalysis, Complete w Microscopic Urine, Clean Catch     Status: Abnormal   Collection Time: 05/23/21  3:41 PM  Result Value Ref Range   Color, Urine YELLOW YELLOW   APPearance HAZY (A) CLEAR   Specific Gravity, Urine 1.012 1.005 - 1.030   pH 6.0 5.0 - 8.0   Glucose, UA NEGATIVE NEGATIVE mg/dL   Hgb urine dipstick LARGE (A) NEGATIVE   Bilirubin Urine NEGATIVE NEGATIVE   Ketones, ur NEGATIVE NEGATIVE mg/dL   Protein, ur 100 (A) NEGATIVE mg/dL   Nitrite  POSITIVE (A) NEGATIVE   Leukocytes,Ua LARGE (A) NEGATIVE   RBC / HPF >50 (H) 0 - 5 RBC/hpf   WBC, UA >50 (H) 0 - 5 WBC/hpf   Bacteria, UA MANY (A) NONE SEEN   Squamous Epithelial / LPF 0-5 0 - 5  Resp Panel by RT-PCR (Flu A&B, Covid) Urine, Clean Catch     Status: None   Collection Time: 05/23/21  3:41 PM   Specimen: Urine, Clean Catch; Nasopharyngeal(NP) swabs in vial transport medium  Result Value Ref Range   SARS Coronavirus 2 by RT PCR NEGATIVE NEGATIVE    Influenza A by PCR NEGATIVE NEGATIVE   Influenza B by PCR NEGATIVE NEGATIVE  Culture, blood (x 2)     Status: None (Preliminary result)   Collection Time: 05/23/21  3:43 PM   Specimen: BLOOD  Result Value Ref Range   Specimen Description      BLOOD RIGHT ANTECUBITAL Performed at [facility], [address], [place], [place] [address]    Special Requests      BOTTLES DRAWN AEROBIC AND ANAEROBIC Blood Culture adequate volume Performed at [facility] [address], [place], [place] [address]    Culture      NO GROWTH < 12 HOURS Performed at [facility] [address] [place], [place] [address]    Report Status PENDING   Culture, blood (x 2)     Status: None (Preliminary result)   Collection Time: 05/23/21  3:45 PM   Specimen: BLOOD LEFT ARM  Result Value Ref Range   Specimen Description      BLOOD LEFT ARM Performed at [facility] [address], [place], [place] [address]    Special Requests      BOTTLES DRAWN AEROBIC AND ANAEROBIC Blood Culture adequate volume Performed at [facility] [address] [place], [place] [address]    Culture      NO GROWTH < 12 HOURS Performed at [facility] [address], [place], [place] [address]    Report Status PENDING   MRSA Next Gen by PCR, Nasal     Status: None   Collection Time: 05/23/21  6:56 PM   Specimen: Nasal Mucosa; Nasal Swab  Result Value Ref Range   MRSA by PCR Next Gen NOT DETECTED NOT DETECTED  CK     Status: Abnormal   Collection Time: 05/23/21  7:01 PM  Result Value Ref Range   Total CK 37 (L) 49 - 397 U/L  Magnesium     Status: Abnormal   Collection Time: 05/23/21  7:01 PM  Result Value Ref Range   Magnesium 1.6 (L) 1.7 - 2.4 mg/dL  Phosphorus     Status: None   Collection Time: 05/23/21  7:01 PM  Result Value Ref Range   Phosphorus 3.0 2.5 - 4.6 mg/dL  CBG monitoring, ED     Status: Abnormal   Collection Time: 05/23/21   8:46 PM  Result Value Ref Range   Glucose-Capillary 166 (H) 70 - 99 mg/dL  Glucose, capillary     Status: Abnormal   Collection Time: 05/23/21  9:58 PM  Result Value Ref Range   Glucose-Capillary 193 (H) 70 - 99 mg/dL  Glucose, capillary     Status: Abnormal   Collection Time: 05/24/21 12:18 AM  Result Value Ref Range   Glucose-Capillary 192 (H) 70 - 99 mg/dL  Vitamin B12     Status: None  Collection Time: 05/24/21  3:24 AM  Result Value Ref Range   Vitamin B-12 312 180 - 914 pg/mL  Folate     Status: None   Collection Time: 05/24/21  3:24 AM  Result Value Ref Range   Folate 7.7 >5.9 ng/mL  Iron and TIBC     Status: Abnormal   Collection Time: 05/24/21  3:24 AM  Result Value Ref Range   Iron 13 (L) 45 - 182 ug/dL   TIBC 222 (L) 250 - 450 ug/dL   Saturation Ratios 6 (L) 17.9 - 39.5 %   UIBC 209 ug/dL  Ferritin     Status: None   Collection Time: 05/24/21  3:24 AM  Result Value Ref Range   Ferritin 205 24 - 336 ng/mL  Reticulocytes     Status: Abnormal   Collection Time: 05/24/21  3:24 AM  Result Value Ref Range   Retic Ct Pct 3.4 (H) 0.4 - 3.1 %   RBC. 3.84 (L) 4.22 - 5.81 MIL/uL   Retic Count, Absolute 130.2 19.0 - 186.0 K/uL   Immature Retic Fract 16.9 (H) 2.3 - 15.9 %  Magnesium     Status: None   Collection Time: 05/24/21  3:24 AM  Result Value Ref Range   Magnesium 2.0 1.7 - 2.4 mg/dL  Phosphorus     Status: Abnormal   Collection Time: 05/24/21  3:24 AM  Result Value Ref Range   Phosphorus 5.0 (H) 2.5 - 4.6 mg/dL  CBC WITH DIFFERENTIAL     Status: Abnormal   Collection Time: 05/24/21  3:24 AM  Result Value Ref Range   WBC 3.7 (L) 4.0 - 10.5 K/uL   RBC 3.90 (L) 4.22 - 5.81 MIL/uL   Hemoglobin 10.1 (L) 13.0 - 17.0 g/dL   HCT 32.0 (L) 39.0 - 52.0 %   MCV 82.1 80.0 - 100.0 fL   MCH 25.9 (L) 26.0 - 34.0 pg   MCHC 31.6 30.0 - 36.0 g/dL   RDW 17.2 (H) 11.5 - 15.5 %   Platelets 154 150 - 400 K/uL   nRBC 0.0 0.0 - 0.2 %   Neutrophils Relative % 30 %   Neutro Abs  1.1 (L) 1.7 - 7.7 K/uL   Lymphocytes Relative 30 %   Lymphs Abs 1.1 0.7 - 4.0 K/uL   Monocytes Relative 38 %   Monocytes Absolute 1.4 (H) 0.1 - 1.0 K/uL   Eosinophils Relative 0 %   Eosinophils Absolute 0.0 0.0 - 0.5 K/uL   Basophils Relative 1 %   Basophils Absolute 0.0 0.0 - 0.1 K/uL   Immature Granulocytes 1 %   Abs Immature Granulocytes 0.03 0.00 - 0.07 K/uL  TSH     Status: None   Collection Time: 05/24/21  3:24 AM  Result Value Ref Range   TSH 1.664 0.350 - 4.500 uIU/mL  Comprehensive metabolic panel     Status: Abnormal   Collection Time: 05/24/21  3:24 AM  Result Value Ref Range   Sodium 140 135 - 145 mmol/L   Potassium 3.9 3.5 - 5.1 mmol/L   Chloride 107 98 - 111 mmol/L   CO2 26 22 - 32 mmol/L   Glucose, Bld 196 (H) 70 - 99 mg/dL   BUN 23 8 - 23 mg/dL   Creatinine, Ser 1.69 (H) 0.61 - 1.24 mg/dL   Calcium 8.6 (L) 8.9 - 10.3 mg/dL   Total Protein 5.6 (L) 6.5 - 8.1 g/dL   Albumin 3.0 (L) 3.5 - 5.0 g/dL   AST  13 (L) 15 - 41 U/L   ALT 19 0 - 44 U/L   Alkaline Phosphatase 76 38 - 126 U/L   Total Bilirubin 1.3 (H) 0.3 - 1.2 mg/dL   GFR, Estimated 42 (L) >60 mL/min   Anion gap 7 5 - 15  Glucose, capillary     Status: Abnormal   Collection Time: 05/24/21  4:00 AM  Result Value Ref Range   Glucose-Capillary 181 (H) 70 - 99 mg/dL  Glucose, capillary     Status: Abnormal   Collection Time: 05/24/21  9:24 AM  Result Value Ref Range   Glucose-Capillary 211 (H) 70 - 99 mg/dL   Recent Results (from the past 240 hour(s))  Resp Panel by RT-PCR (Flu A&B, Covid) Urine, Clean Catch     Status: None   Collection Time: 05/23/21  3:41 PM   Specimen: Urine, Clean Catch; Nasopharyngeal(NP) swabs in vial transport medium  Result Value Ref Range Status   SARS Coronavirus 2 by RT PCR NEGATIVE NEGATIVE Final    Comment: (NOTE) SARS-CoV-2 target nucleic acids are NOT DETECTED.  The SARS-CoV-2 RNA is generally detectable in upper respiratory specimens during the acute phase of  infection. The lowest concentration of SARS-CoV-2 viral copies this assay can detect is 138 copies/mL. A negative result does not preclude SARS-Cov-2 infection and should not be used as the sole basis for treatment or other patient management decisions. A negative result may occur with  improper specimen collection/handling, submission of specimen other than nasopharyngeal swab, presence of viral mutation(s) within the areas targeted by this assay, and inadequate number of viral copies(<138 copies/mL). A negative result must be combined with clinical observations, patient history, and epidemiological information. The expected result is Negative.  Fact Sheet for Patients:  EntrepreneurPulse.com.au  Fact Sheet for Healthcare Providers:  IncredibleEmployment.be  This test is no t yet approved or cleared by the [place] FDA and  has been authorized for detection and/or diagnosis of SARS-CoV-2 by FDA under an Emergency Use Authorization (EUA). This EUA will remain  in effect (meaning this test can be used) for the duration of the COVID-19 declaration under Section 564(b)(1) of the Act, 21 U.S.C.section 360bbb-3(b)(1), unless the authorization is terminated  or revoked sooner.       Influenza A by PCR NEGATIVE NEGATIVE Final   Influenza B by PCR NEGATIVE NEGATIVE Final    Comment: (NOTE) The Xpert Xpress SARS-CoV-2/FLU/RSV plus assay is intended as an aid in the diagnosis of influenza from Nasopharyngeal swab specimens and should not be used as a sole basis for treatment. Nasal washings and aspirates are unacceptable for Xpert Xpress SARS-CoV-2/FLU/RSV testing.  Fact Sheet for Patients: EntrepreneurPulse.com.au  Fact Sheet for Healthcare Providers: IncredibleEmployment.be  This test is not yet approved or cleared by the [place] FDA and has been authorized for detection and/or diagnosis of SARS-CoV-2  by FDA under an Emergency Use Authorization (EUA). This EUA will remain in effect (meaning this test can be used) for the duration of the COVID-19 declaration under Section 564(b)(1) of the Act, 21 U.S.C. section 360bbb-3(b)(1), unless the authorization is terminated or revoked.  Performed at [facility], [place] [address], [place], [place] [address]   Culture, blood (x 2)     Status: None (Preliminary result)   Collection Time: 05/23/21  3:43 PM   Specimen: BLOOD  Result Value Ref Range Status   Specimen Description   Final    BLOOD RIGHT ANTECUBITAL Performed at [facility], [address] [place], [place] [address]    Special Requests   Final    BOTTLES DRAWN AEROBIC AND ANAEROBIC Blood Culture adequate volume Performed at [facility] [address] [place], [place] [address]    Culture   Final    NO GROWTH < 12 HOURS Performed at [facility] [address], [place], [place] [address]    Report Status PENDING  Incomplete  Culture, blood (x 2)     Status: None (Preliminary result)   Collection Time: 05/23/21  3:45 PM   Specimen: BLOOD LEFT ARM  Result Value Ref Range Status   Specimen Description   Final    BLOOD LEFT ARM Performed at [facility], [place] [address] [place], [place] [address]    Special Requests   Final    BOTTLES DRAWN AEROBIC AND ANAEROBIC Blood Culture adequate volume Performed at [facility] [address] [place], [place] [address]    Culture   Final    NO GROWTH < 12 HOURS Performed at [facility] [address] [place], [place] [address]    Report Status PENDING  Incomplete  MRSA Next Gen by PCR, Nasal     Status: None   Collection Time: 05/23/21  6:56 PM   Specimen: Nasal Mucosa; Nasal Swab  Result Value Ref Range Status   MRSA by PCR Next Gen NOT DETECTED NOT DETECTED Final    Comment: (NOTE) The GeneXpert MRSA Assay (FDA approved for  NASAL specimens only), is one component of a comprehensive MRSA colonization surveillance program. It is not intended to diagnose MRSA infection nor to guide or monitor treatment for MRSA infections. Test performance is not FDA approved in patients less than 17 years old. Performed at [facility], [place] [address], [place], [place] [address]    Creatinine: Recent Labs    05/23/21 1540 05/24/21 0324  CREATININE 1.73* 1.69*    Impression/Assessment/plan:  Festus Aloe 05/24/2021, 11:26 AM    Sepsis - Continue sepsis protocol per medical team.  Continue Foley catheter until stable and ready for void trial in (ambulatory).  BPH - keep on finasteride and when stable add back alpha blocker (silodosin 8 mg po daily) prior to void trial foley removal.   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   I will notify Dr. [name] of admission.  Patient does have a follow-up June 24, 2021 with Dr. [name] in the office and I put this on the chart.  Discussed with Alden Benjamin and patient and all questions answered.  She mentioned he does not like to drink a lot of water and I reinforced the importance of him staying well-hydrated to keep the stent in his bladder flushed.

## 2021-05-23 NOTE — Progress Notes (Signed)
eLINK IS MONITORING THIS CODE SEPSIS

## 2021-05-23 NOTE — Progress Notes (Signed)
Pharmacy Antibiotic Note  [name] is a 75 y.o. male admitted on 05/23/2021 with sepsis and UTI.  Pharmacy has been consulted for Cefepime dosing.  Plan: Cefepime 2g IV q12h Follow up renal function, culture results, and clinical course.      Temp (24hrs), Avg:101.6 F (38.7 C), Min:100.2 F (37.9 C), Max:102.9 F (39.4 C)  Recent Labs  Lab 05/23/21 1540  WBC 5.2  CREATININE 1.73*  LATICACIDVEN 1.4    Estimated Creatinine Clearance: 48.2 mL/min (A) (by C-G formula based on SCr of 1.73 mg/dL (H)).    No Known Allergies    Thank you for allowing pharmacy to be a part of this patient's care.  Gretta Arab PharmD, BCPS Clinical Pharmacist [facility] main pharmacy [phone] 05/23/2021 7:28 PM

## 2021-05-23 NOTE — ED Notes (Signed)
Patient triage completed using interpreter Liudmyla # [phone]

## 2021-05-23 NOTE — H&P (Signed)
[name] QZE:[id number] DOB: 02-Sep-1946 DOA: 05/23/2021     PCP: [name], MD   Outpatient Specialists:   CARDS:  Dr. Geanie Berlin  Oncology   Dr. Lorenso Courier   Urology Dr. Junious Silk  Patient arrived to ER on 05/23/21 at 1444 Referred by Attending Lacretia Leigh, MD   Patient coming from: home Lives With family   Chief Complaint: fever   HPI: [name] is a 75 y.o. male with medical history significant of DM2, CKD, Right kideny Hydronephrosis, diffuse non-Hodgkin lymphoma, Anemia, HTN, OSA, CAD s/p PCI with DES to RCA 11/2020, PAF on Eliquis and amiodarone, NSVT,    Presented with   fever temp 102, has been urinating frequently, foul smelling urine noted Rigors all day Ever since his dc he continue to feel very weak, he usually walks on his own but have not been able to do so recently  Recent reccurent readmissions from 6/16-21 2022 and again 6/22-26 2022  AKI on CKD secondary to right hydronephrosis from obstructing mass.  He underwent right ureteral stent placement on 05/01/2021 by urology, Dr. Junious Silk.  Foley catheter was placed postoperatively and removed on day of discharge.  He was also found to have pathologic abdominal lymphadenopathy and pelvic masses.  He underwent IR guided biopsy of the right infrarenal retroperitoneal mass on 6/20 UA consistent with UTI. On 6/22 he was admitted for complicated UTI,   Urine cultures grew Pseudomonas,  blood cultures 1/2 bottles positive for Pseudomonas, it  was sensitive to ciprofloxacin.  Patient has completed cefepime for 5 days.  Urology was consulted, recommended to continue antibiotics and follow-up outpatient with Dr. [name].  Patient was discharged home on ciprofloxacin 500 mg twice daily for 5 more days to complete 10-day treatment for Pseudomonas UTI and bacteremia.  Infectious risk factors:  Reports fever,     Has  NOt been vaccinated against COVID    Initial COVID TEST  NEGATIVE   Lab Results   Component Value Date   [place] 05/23/2021   [address] NEGATIVE 05/06/2021   [place] NEGATIVE 04/30/2021   [place] NEGATIVE 12/23/2020     Regarding pertinent Chronic problems:     Hyperlipidemia - on statins lipitor    HTN on lopressor, indur, norvasc     CAD  - On Aspirin, statin, betablocker, Plavix              Sp DES 11/2020   DM 2 -  Lab Results  Component Value Date   HGBA1C 7.7 (H) 05/01/2021   on insulin,        obesity-   BMI Readings from Last 1 Encounters:  05/13/21 36.20 kg/m     OSA -on nocturnal o CPAP,       A. 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  Lab Results  Component Value Date   CREATININE 1.73 (H) 05/23/2021   CREATININE 1.77 (H) 05/13/2021   CREATININE 1.77 (H) 05/10/2021    Chronic anemia - baseline hg Hemoglobin & Hematocrit  Recent Labs    05/10/21 0529 05/13/21 1014 05/23/21  1540  HGB 9.3* 9.6* 10.4*   While in ER: Initially started on rocephin changed to cefepime   ED Triage Vitals [05/23/21 1459]  Enc Vitals Group     BP (!) 149/77     Pulse Rate 90     Resp (!) 38     Temp (!) 102.9 F (39.4 C)     Temp Source Oral     SpO2 95 %     Weight      Height      Head Circumference      Peak Flow      Pain Score      Pain Loc      Pain Edu?      Excl. in [place]?   XVQM(08)@     _________________________________________ Significant initial  Findings: Abnormal Labs Reviewed  CBC WITH DIFFERENTIAL/PLATELET - Abnormal; Notable for the following components:      Result Value   RBC 4.03 (*)    Hemoglobin 10.4 (*)    HCT 32.8 (*)    MCH 25.8 (*)    RDW 17.2 (*)    Monocytes Absolute 1.3 (*)    All other components  within normal limits  COMPREHENSIVE METABOLIC PANEL - Abnormal; Notable for the following components:   Glucose, Bld 190 (*)    Creatinine, Ser 1.73 (*)    Calcium 8.7 (*)    Total Protein 6.1 (*)    Total Bilirubin 1.4 (*)    GFR, Estimated 41 (*)    All other components within normal limits  PROTIME-INR - Abnormal; Notable for the following components:   Prothrombin Time 20.0 (*)    INR 1.7 (*)    All other components within normal limits  URINALYSIS, COMPLETE (UACMP) WITH MICROSCOPIC - Abnormal; Notable for the following components:   APPearance HAZY (*)    Hgb urine dipstick LARGE (*)    Protein, ur 100 (*)    Nitrite POSITIVE (*)    Leukocytes,Ua LARGE (*)    RBC / HPF >50 (*)    WBC, UA >50 (*)    Bacteria, UA MANY (*)    All other components within normal limits   ____________________________________________   CXR - NON acute  CTabd/pelvis -  Ordered     ECG: Ordered Personally reviewed by me showing: HR : 82 Rhythm:  NSR,    no evidence of ischemic changes QTC 504 ____________________ This patient meets SIRS Criteria and may be septic.    The recent clinical data is shown below. Vitals:   05/23/21 1745 05/23/21 1800 05/23/21 1815 05/23/21 1830  BP: 128/63 (!) 145/79 (!) 156/83 131/73  Pulse: 83 78 83 80  Resp: 18 (!) 25 (!) 30 (!) 25  Temp:      TempSrc:      SpO2: 95% 96% 92% 97%   WBC     Component Value Date/Time   WBC 5.2 05/23/2021 1540   LYMPHSABS 1.2 05/23/2021 1540   MONOABS 1.3 (H) 05/23/2021 1540   EOSABS 0.0 05/23/2021 1540   BASOSABS 0.0 05/23/2021 1540   Lactic Acid, Venous    Component Value Date/Time   LATICACIDVEN 1.4 05/23/2021 1540     Procalcitonin 0.49   UA no evidence of UTI     Urine analysis:    Component Value Date/Time   COLORURINE YELLOW 05/23/2021 1541   APPEARANCEUR HAZY (A) 05/23/2021 1541   LABSPEC 1.012 05/23/2021 1541   PHURINE 6.0 05/23/2021 1541   GLUCOSEU NEGATIVE 05/23/2021 1541  HGBUR LARGE (A)  05/23/2021 1541   BILIRUBINUR NEGATIVE 05/23/2021 1541   KETONESUR NEGATIVE 05/23/2021 1541   PROTEINUR 100 (A) 05/23/2021 1541   NITRITE POSITIVE (A) 05/23/2021 1541   LEUKOCYTESUR LARGE (A) 05/23/2021 1541    Results for orders placed or performed during the hospital encounter of 05/23/21  Resp Panel by RT-PCR (Flu A&B, Covid) Urine, Clean Catch     Status: None   Collection Time: 05/23/21  3:41 PM   Specimen: Urine, Clean Catch; Nasopharyngeal(NP) swabs in vial transport medium  Result Value Ref Range Status   SARS Coronavirus 2 by RT PCR NEGATIVE NEGATIVE Final         Influenza A by PCR NEGATIVE NEGATIVE Final   Influenza B by PCR NEGATIVE NEGATIVE Final           _______________________________________________________ ER Provider Called:   Urology  Dr. Junious Silk They Recommend admit to medicine, CT abd/pelvis, ID consult, foley placement Will see in AM    _______________________________________________ Hospitalist was called for admission for Sepsis, uti  The following Work up has been ordered so far:  Orders Placed This Encounter  Procedures   Critical Care   Culture, blood (x 2)   Resp Panel by RT-PCR (Flu A&B, Covid) Nasopharyngeal Swab   DG Chest Port 1 View   CBC with Differential   Comprehensive metabolic panel   Protime-INR   APTT   Urinalysis, Complete w Microscopic Urine, Clean Catch   Refer to Sidebar Report: Sepsis Bundle ED/IP   If lactate (lactic acid) >2, verify repeat lactic acid order has been placed to be drawn   Document vital signs within 1-hour of fluid bolus completion and notify provider of bolus completion   Vital signs   Vital signs   Assess and Document Glasgow Coma Scale   RN to call RRT (rapid response team)   Cardiac Monitoring Continuous x 24 hours Indications for use: Other; other indications for use: sepsis   Check temperature   Code Sepsis activation.  This occurs automatically when order is signed and prioritizes pharmacy, lab, and  radiology services for STAT collections and interventions.  If [facility] downtime, call [facility] [phone]) to activate Code Sepsis.   Pharmacy Consult   Consult to hospitalist   Airborne and Contact precautions     Following Medications were ordered in ER: Medications  cefTRIAXone (ROCEPHIN) 2 g in sodium chloride 0.9 % 100 mL IVPB (has no administration in time range)  lactated ringers bolus 1,000 mL (0 mLs Intravenous Stopped 05/23/21 1646)    And  lactated ringers bolus 1,000 mL (0 mLs Intravenous Stopped 05/23/21 1658)    And  lactated ringers bolus 1,000 mL (0 mLs Intravenous Stopped 05/23/21 1658)    And  lactated ringers bolus 500 mL (0 mLs Intravenous Stopped 05/23/21 1727)  acetaminophen (TYLENOL) tablet 1,000 mg (1,000 mg Oral Given 05/23/21 1549)     OTHER Significant initial  Findings:  labs showing:    Recent Labs  Lab 05/23/21 1540  NA 135  K 3.9  CO2 24  GLUCOSE 190*  BUN 21  CREATININE 1.73*  CALCIUM 8.7*    Cr  stable,  Lab Results  Component Value Date   CREATININE 1.73 (H) 05/23/2021   CREATININE 1.77 (H) 05/13/2021   CREATININE 1.77 (H) 05/10/2021    Recent Labs  Lab 05/23/21 1540  AST 16  ALT 23  ALKPHOS 94  BILITOT 1.4*  PROT 6.1*  ALBUMIN 3.6   Lab Results  Component Value  Date   CALCIUM 8.7 (L) 05/23/2021   PHOS 3.0 05/09/2021    Plt: Lab Results  Component Value Date   PLT 154 05/23/2021         Recent Labs  Lab 05/23/21 1540  WBC 5.2  NEUTROABS 2.7  HGB 10.4*  HCT 32.8*  MCV 81.4  PLT 154    HG/HCT stable,       Component Value Date/Time   HGB 10.4 (L) 05/23/2021 1540   HGB 9.6 (L) 05/13/2021 1014   HCT 32.8 (L) 05/23/2021 1540   MCV 81.4 05/23/2021 1540     Cardiac Panel (last 3 results) Recent Labs    05/23/21 1901  CKTOTAL 37*     DM  labs:  HbA1C: Recent Labs    05/01/21 0515  HGBA1C 7.7*       CBG (last 3)  No results for input(s): GLUCAP in the last 72 hours.        Cultures:    Component  Value Date/Time   SDES  05/06/2021 1824    IN/OUT CATH URINE Performed at [facility] [address] [place], [place] [address]    SPECREQUEST  05/06/2021 1824    NONE Performed at [facility], [place] [address], [place], [place] [address]    CULT 70,000 COLONIES/mL PSEUDOMONAS AERUGINOSA (A) 05/06/2021 1824   REPTSTATUS 05/09/2021 FINAL 05/06/2021 1824     Radiological Exams on Admission: DG Chest Port 1 View  Result Date: 05/23/2021 CLINICAL DATA:  Fever EXAM: PORTABLE CHEST 1 VIEW COMPARISON:  05/06/2021 FINDINGS: Low volume AP portable examination with mild, diffuse bilateral interstitial opacity and heterogeneous opacity in the retrocardiac left lung base. Cardiomegaly. The visualized skeletal structures are unremarkable. IMPRESSION: 1. Low volume AP portable examination with mild, diffuse bilateral interstitial opacity and heterogeneous opacity in the retrocardiac left lung base, which may reflect atelectasis in the setting of cardiomegaly or alternately infection/aspiration. 2. Cardiomegaly. Electronically Signed   By: Eddie Candle M.D.   On: 05/23/2021 15:49   _______________________________________________________________________________________________________ Latest  Blood pressure 131/73, pulse 80, temperature 100.2 F (37.9 C), temperature source Oral, resp. rate (!) 25, SpO2 97 %.   Review of Systems:    Pertinent positives include:  Fevers, chills, fatigue,   Constitutional:  No weight loss, night sweats, weight loss  HEENT:  No headaches, Difficulty swallowing,Tooth/dental problems,Sore throat,  No sneezing, itching, ear ache, nasal congestion, post nasal drip,  Cardio-vascular:  No chest pain, Orthopnea, PND, anasarca, dizziness, palpitations.no Bilateral lower extremity swelling  GI:  No heartburn, indigestion, abdominal pain, nausea, vomiting, diarrhea, change in bowel habits, loss of appetite, melena, blood in stool,  hematemesis Resp:  no shortness of breath at rest. No dyspnea on exertion, No excess mucus, no productive cough, No non-productive cough, No coughing up of blood.No change in color of mucus.No wheezing. Skin:  no rash or lesions. No jaundice GU:  no dysuria, change in color of urine, no urgency or frequency. No straining to urinate.  No flank pain.  Musculoskeletal:  No joint pain or no joint swelling. No decreased range of motion. No back pain.  Psych:  No change in mood or affect. No depression or anxiety. No memory loss.  Neuro: no localizing neurological complaints, no tingling, no weakness, no double vision, no gait abnormality, no slurred speech, no confusion  All systems reviewed and apart from [place] all are negative _______________________________________________________________________________________________ Past Medical History:   Past Medical History:  Diagnosis Date   Anginal pain (  [place])    Coronary artery disease    Diabetes mellitus without complication ([place])    Dysrhythmia    Hypertension    Sleep apnea       Past Surgical History:  Procedure Laterality Date   CARDIAC CATHETERIZATION     CYSTOSCOPY WITH RETROGRADE PYELOGRAM, URETEROSCOPY AND STENT PLACEMENT Right 05/01/2021   Procedure: CYSTOSCOPY WITH RIGHT  RETROGRADE PYELOGRAM,  AND RIGHT STENT PLACEMENT;  Surgeon: Festus Aloe, MD;  Location: [facility];  Service: Urology;  Laterality: Right;   EYE SURGERY     INJECTION OF SILICONE OIL Left 80/99/8338   Procedure: INJECTION OF SILICONE OIL;  Surgeon: Sherlynn Stalls, MD;  Location: [facility];  Service: Ophthalmology;  Laterality: Left;   INJECTION OF SILICONE OIL Left 2/50/5397   Procedure: INJECTION OF SILICONE OIL;  Surgeon: Sherlynn Stalls, MD;  Location: [facility];  Service: Ophthalmology;  Laterality: Left;   JOINT REPLACEMENT Right    hip   LASER PHOTO ABLATION Left 12/25/2020   Procedure: LASER PHOTO ABLATION;  Surgeon: Sherlynn Stalls, MD;  Location: [facility];   Service: Ophthalmology;  Laterality: Left;   PARS PLANA VITRECTOMY Left 11/10/2020   Procedure: PARS PLANA VITRECTOMY WITH 25 GAUGE;  Surgeon: Sherlynn Stalls, MD;  Location: [facility];  Service: Ophthalmology;  Laterality: Left;   PARS PLANA VITRECTOMY Left 12/25/2020   Procedure: PARS PLANA VITRECTOMY WITH 25 GAUGE IN LEFT EYE;  Surgeon: Sherlynn Stalls, MD;  Location: [facility];  Service: Ophthalmology;  Laterality: Left;   PERFLUORONE INJECTION Left 12/25/2020   Procedure: PERFLUORONE INJECTION;  Surgeon: Sherlynn Stalls, MD;  Location: [facility];  Service: Ophthalmology;  Laterality: Left;   PHOTOCOAGULATION WITH LASER Left 11/10/2020   Procedure: PHOTOCOAGULATION WITH LASER;  Surgeon: Sherlynn Stalls, MD;  Location: [facility];  Service: Ophthalmology;  Laterality: Left;   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 He has never used smokeless tobacco. He reports previous alcohol use. He reports that he does not use drugs.     Family History:   Family History  Problem Relation Age of Onset   CAD Other    ______________________________________________________________________________________________ Allergies: No Known Allergies   Prior to Admission medications   Medication Sig Start Date End Date Taking? Authorizing Provider  acetaminophen (TYLENOL) 650 MG CR tablet Take 650 mg by mouth every 8 (eight) hours as needed for pain or fever (headache).   Yes [provider]  amiodarone (PACERONE) 200 MG tablet Take 200 mg by mouth every morning. 04/02/21  Yes [provider]  amLODipine (NORVASC)  10 MG tablet Take 1 tablet (10 mg total) by mouth daily. Patient taking differently: Take 10 mg by mouth every morning. 05/05/21  Yes Adhikari, Amrit, MD  apixaban (ELIQUIS) 5 MG TABS tablet Take 5 mg by mouth 2 (two) times daily.   Yes [provider]  atorvastatin (LIPITOR) 20 MG tablet Take 20 mg by mouth at bedtime. 11/04/20  Yes [provider]  clopidogrel (PLAVIX) 75 MG tablet Take 75 mg by mouth every morning. 12/17/20  Yes [provider]  finasteride (PROSCAR) 5  MG tablet Take 5 mg by mouth at bedtime. 05/14/21  Yes [provider]  glipiZIDE (GLUCOTROL) 10 MG tablet Take 10 mg by mouth 2 (two) times daily. 05/15/21  Yes [provider]  insulin glargine (LANTUS) 100 unit/mL SOPN Inject 34 Units into the skin at bedtime.   Yes [provider]  isosorbide mononitrate (IMDUR) 30 MG 24 hr tablet Take 30 mg by mouth every morning. 09/08/20  Yes [provider]  metFORMIN (GLUCOPHAGE) 1000 MG tablet Take 1,000 mg by mouth 2 (two) times daily. 05/15/21  Yes [provider]  metoprolol tartrate (LOPRESSOR) 50 MG tablet Take 1 tablet (50 mg total) by mouth 2 (two) times daily. 05/05/21 06/04/21 Yes Shelly Coss, MD  nitroGLYCERIN (NITROSTAT) 0.3 MG SL tablet Place 0.3 mg under the tongue every 5 (five) minutes x 3 doses as needed for chest pain. 09/08/20  Yes [provider]  ofloxacin (OCUFLOX) 0.3 % ophthalmic solution Place 1 drop into the left eye 2 (two) times daily.   Yes [provider]  silodosin (RAPAFLO) 8 MG CAPS capsule Take 8 mg by mouth at bedtime. 05/15/21  Yes [provider]    ___________________________________________________________________________________________________ Physical Exam: Vitals with BMI 05/23/2021 05/23/2021 05/23/2021  Height - - -  Weight - - -  BMI - - -  Systolic 376 283 151  Diastolic 73 83 79  Pulse 80 83 78     1. General:  in No Acute distress   Chronically  ill    -appearing 2. Psychological: Alert and  Oriented 3. Head/ENT:    Dry Mucous Membranes                          Head Non traumatic, neck supple                           Poor Dentition 4. SKIN:    decreased Skin turgor,  Skin clean Dry and intact no rash 5. Heart: Regular rate and rhythm no Murmur, no Rub or gallop 6. Lungs:  no wheezes or crackles   7. Abdomen: Soft,  non-tender, Non distended   obese  bowel sounds present 8. Lower extremities: no clubbing, cyanosis, no  edema 9. Neurologically Grossly intact, moving all 4 extremities equally    10. MSK: Normal range of motion    Chart has been reviewed  ______________________________________________________________________________________________  Assessment/Plan 75 y.o. male with medical history significant of DM2, CKD, Right kideny Hydronephrosis, diffuse non-Hodgkin lymphoma, Anemia, HTN, OSA, CAD s/p PCI with DES to RCA 11/2020, PAF on Eliquis and amiodarone, NSVT,  Admitted for sepsis /UTI  Present on Admission:  Sepsis ([place]) -  -SIRS criteria met with      fever   RR >20 Today's Vitals   05/23/21 1815 05/23/21 1830 05/23/21 1845 05/23/21 1900  BP: (!) 156/83 131/73 (!) 146/67 (!) 145/70  Pulse: 83 80 81 82  Resp: (!) 30 (!) 25 (!) 27 (!) 28  Temp:      TempSrc:      SpO2: 92% 97% 96% 96%    -Most likely source being: urinary      - Obtain serial lactic acid and procalcitonin level.  - Initiated IV antibiotics   - await results of blood and urine culture  - Rehydrate aggressively          7:61 PM   Complicated UTI (urinary tract infection) - obtain blood  and urine culture, cont cefepime for now Urine and blood cult ordered, ID consulted Place foley per Urology recs   Sleep apnea -  ordered CPAP   PAF (paroxysmal atrial fibrillation) (HCC) - cont lopressor if Bp allows, restarted eliquis    Hyperlipidemia associated with type 2 diabetes mellitus ([place]) - cont lipitor   Hydronephrosis of right kidney -  due to mass, pathology positive for lymphoma will notify oncolgy  Notified Dr. Alvy Bimler and Dr. Lorenso Courier Discussed with Urology  Obtain CT abd    CAD (coronary artery disease) -  - chronic, continue statin and beta blocker resume Plavix , ok as per Urology  DM2 -  - Order Sensitive  SSI   - continue home insulin regimen    Lantus decrease to 25units,  -  check TSH and HgA1C  - Hold by mouth medications    Hypomagnesemia replace  Debility will need PT OT eval prior to dc home  CKD -  -chronic avoid nephrotoxic medications such as NSAIDs, Vanco Zosyn combo,  avoid hypotension, continue to follow renal function  Prolonged QT - - will monitor on tele avoid QT prolonging medications, rehydrate correct electrolytes  Anemia - stable continue to follow, anemia panel in AM, no hematouria  Other plan as per orders.  DVT prophylaxis: Eliquis    Code Status:    Code Status: Prior FULL CODE  as per patient   I had personally discussed CODE STATUS with patient and family     Family Communication:   Family  at  Bedside  plan of care was discussed   with   Wife,  Disposition Plan:     To home once workup is complete and patient is stable   Following barriers for discharge:                            Electrolytes corrected                               Anemia stable                             Pain controlled with PO medications                               Afebrile, white count improving able to transition to PO antibiotics                             Will need to be able to tolerate PO                            Will likely need home health, home O2, set up                           Will need consultants to evaluate patient prior to discharge                      Would benefit from PT/OT eval prior to DC  Ordered  Consults called:   urology aware will see in AM, ID aware spoke to Dr. Baxter Flattery Emailed oncology Dr. Lorenso Courier and dr. Alvy Bimler  Admission  status:  ED Disposition     ED Disposition  [place]: [facility] [[id number]]  Level of Care: Progressive [102]  Admit to Progressive based on following criteria: MULTISYSTEM THREATS such as stable sepsis, metabolic/electrolyte imbalance with or without encephalopathy that is responding to early treatment.  May admit patient to [facility] or [facility] if equivalent level of care is available:: No  Covid Evaluation: Confirmed COVID Negative  Diagnosis: Sepsis [facility]) [[id number]]  Admitting Physician: Toy Baker [[id number]]  Attending Physician: Toy Baker [[id number]]  Estimated length of stay: past midnight tomorrow  Certification:: I certify this patient will need inpatient services for at least 2 midnights            inpatient     I Expect 2 midnight stay secondary to severity of patient's current illness need for inpatient interventions justified by the following:  hemodynamic instability despite optimal treatment (tachypnea  )  Severe lab/radiological/exam abnormalities including:    sepsis and extensive comorbidities including:  DM2     CAD     Obesity  CKD   malignancy,   Chronic anticoagulation  That are currently affecting medical management.   I expect  patient to be hospitalized for 2 midnights requiring inpatient medical care.  Patient is at high risk for adverse outcome (such as loss of life or disability) if not treated.  Indication for inpatient stay as follows:    Hemodynamic instability despite maximal medical therapy,    Need for operative/procedural  intervention    Need for IV antibiotics, IV fluids,     Level of care   progressive tele indefinitely please discontinue once patient no longer qualifies COVID-19 Labs    Lab Results  Component Value Date   [place] 05/23/2021     Precautions: admitted as  Covid Negative      PPE: Used by the provider:   N95   eye Goggles,  Gloves    Toua Stites 05/23/2021, 8:37 PM    [facility]     after 2 AM please page floor coverage PA If 7AM-7PM, please contact the day team taking care of the patient using [email or web address]   Patient was evaluated in the context of the global COVID-19 pandemic, which necessitated consideration that the patient might be at risk for infection with the SARS-CoV-2 virus that causes COVID-19. Institutional protocols and algorithms that pertain to the evaluation of patients at risk for COVID-19 are in a state of rapid change based on information released by regulatory bodies including the CDC and federal and state organizations. These policies and algorithms were followed during the patient's care.

## 2021-05-23 NOTE — ED Triage Notes (Signed)
Patient coming from home with with c/o weakness. Patient was recently treated for UTI and have competed the antibiotic still have a fever. Patient speaks [place]

## 2021-05-24 ENCOUNTER — Inpatient Hospital Stay (HOSPITAL_COMMUNITY): Payer: Medicare HMO

## 2021-05-24 DIAGNOSIS — N390 Urinary tract infection, site not specified: Secondary | ICD-10-CM | POA: Diagnosis not present

## 2021-05-24 DIAGNOSIS — I517 Cardiomegaly: Secondary | ICD-10-CM

## 2021-05-24 DIAGNOSIS — A419 Sepsis, unspecified organism: Secondary | ICD-10-CM

## 2021-05-24 DIAGNOSIS — C8330 Diffuse large B-cell lymphoma, unspecified site: Secondary | ICD-10-CM | POA: Diagnosis present

## 2021-05-24 DIAGNOSIS — N179 Acute kidney failure, unspecified: Secondary | ICD-10-CM | POA: Diagnosis not present

## 2021-05-24 DIAGNOSIS — D649 Anemia, unspecified: Secondary | ICD-10-CM | POA: Diagnosis present

## 2021-05-24 DIAGNOSIS — N400 Enlarged prostate without lower urinary tract symptoms: Secondary | ICD-10-CM | POA: Diagnosis present

## 2021-05-24 LAB — CBC WITH DIFFERENTIAL/PLATELET
Abs Immature Granulocytes: 0.03 10*3/uL (ref 0.00–0.07)
Basophils Absolute: 0 10*3/uL (ref 0.0–0.1)
Basophils Relative: 1 %
Eosinophils Absolute: 0 10*3/uL (ref 0.0–0.5)
Eosinophils Relative: 0 %
HCT: 32 % — ABNORMAL LOW (ref 39.0–52.0)
Hemoglobin: 10.1 g/dL — ABNORMAL LOW (ref 13.0–17.0)
Immature Granulocytes: 1 %
Lymphocytes Relative: 30 %
Lymphs Abs: 1.1 10*3/uL (ref 0.7–4.0)
MCH: 25.9 pg — ABNORMAL LOW (ref 26.0–34.0)
MCHC: 31.6 g/dL (ref 30.0–36.0)
MCV: 82.1 fL (ref 80.0–100.0)
Monocytes Absolute: 1.4 10*3/uL — ABNORMAL HIGH (ref 0.1–1.0)
Monocytes Relative: 38 %
Neutro Abs: 1.1 10*3/uL — ABNORMAL LOW (ref 1.7–7.7)
Neutrophils Relative %: 30 %
Platelets: 154 10*3/uL (ref 150–400)
RBC: 3.9 MIL/uL — ABNORMAL LOW (ref 4.22–5.81)
RDW: 17.2 % — ABNORMAL HIGH (ref 11.5–15.5)
WBC: 3.7 10*3/uL — ABNORMAL LOW (ref 4.0–10.5)
nRBC: 0 % (ref 0.0–0.2)

## 2021-05-24 LAB — ECHOCARDIOGRAM COMPLETE
AR max vel: 3.06 cm2
AV Area VTI: 2.84 cm2
AV Area mean vel: 3.09 cm2
AV Mean grad: 9 mmHg
AV Peak grad: 16.2 mmHg
Ao pk vel: 2.01 m/s
Area-P 1/2: 3.74 cm2
Calc EF: 48.7 %
S' Lateral: 4.59 cm
Single Plane A2C EF: 54.4 %
Single Plane A4C EF: 38.1 %

## 2021-05-24 LAB — RETICULOCYTES
Immature Retic Fract: 16.9 % — ABNORMAL HIGH (ref 2.3–15.9)
RBC.: 3.84 MIL/uL — ABNORMAL LOW (ref 4.22–5.81)
Retic Count, Absolute: 130.2 10*3/uL (ref 19.0–186.0)
Retic Ct Pct: 3.4 % — ABNORMAL HIGH (ref 0.4–3.1)

## 2021-05-24 LAB — COMPREHENSIVE METABOLIC PANEL
ALT: 19 U/L (ref 0–44)
AST: 13 U/L — ABNORMAL LOW (ref 15–41)
Albumin: 3 g/dL — ABNORMAL LOW (ref 3.5–5.0)
Alkaline Phosphatase: 76 U/L (ref 38–126)
Anion gap: 7 (ref 5–15)
BUN: 23 mg/dL (ref 8–23)
CO2: 26 mmol/L (ref 22–32)
Calcium: 8.6 mg/dL — ABNORMAL LOW (ref 8.9–10.3)
Chloride: 107 mmol/L (ref 98–111)
Creatinine, Ser: 1.69 mg/dL — ABNORMAL HIGH (ref 0.61–1.24)
GFR, Estimated: 42 mL/min — ABNORMAL LOW (ref >60)
Glucose, Bld: 196 mg/dL — ABNORMAL HIGH (ref 70–99)
Potassium: 3.9 mmol/L (ref 3.5–5.1)
Sodium: 140 mmol/L (ref 135–145)
Total Bilirubin: 1.3 mg/dL — ABNORMAL HIGH (ref 0.3–1.2)
Total Protein: 5.6 g/dL — ABNORMAL LOW (ref 6.5–8.1)

## 2021-05-24 LAB — BLOOD CULTURE ID PANEL (REFLEXED) - BCID2

## 2021-05-24 LAB — MAGNESIUM: Magnesium: 2 mg/dL (ref 1.7–2.4)

## 2021-05-24 LAB — GLUCOSE, CAPILLARY
Glucose-Capillary: 174 mg/dL — ABNORMAL HIGH (ref 70–99)
Glucose-Capillary: 175 mg/dL — ABNORMAL HIGH (ref 70–99)
Glucose-Capillary: 181 mg/dL — ABNORMAL HIGH (ref 70–99)
Glucose-Capillary: 192 mg/dL — ABNORMAL HIGH (ref 70–99)
Glucose-Capillary: 211 mg/dL — ABNORMAL HIGH (ref 70–99)
Glucose-Capillary: 226 mg/dL — ABNORMAL HIGH (ref 70–99)

## 2021-05-24 LAB — IRON AND TIBC
Iron: 13 ug/dL — ABNORMAL LOW (ref 45–182)
Saturation Ratios: 6 % — ABNORMAL LOW (ref 17.9–39.5)
TIBC: 222 ug/dL — ABNORMAL LOW (ref 250–450)
UIBC: 209 ug/dL

## 2021-05-24 LAB — FERRITIN: Ferritin: 205 ng/mL (ref 24–336)

## 2021-05-24 LAB — VITAMIN B12: Vitamin B-12: 312 pg/mL (ref 180–914)

## 2021-05-24 LAB — PHOSPHORUS: Phosphorus: 5 mg/dL — ABNORMAL HIGH (ref 2.5–4.6)

## 2021-05-24 LAB — FOLATE: Folate: 7.7 ng/mL (ref >5.9)

## 2021-05-24 LAB — TSH: TSH: 1.664 u[IU]/mL (ref 0.350–4.500)

## 2021-05-24 MED ORDER — VITAMIN B-12 1000 MCG PO TABS: 1000.0000 ug | ORAL_TABLET | Freq: Every day | ORAL | Status: DC

## 2021-05-24 MED ORDER — FOLIC ACID 1 MG PO TABS: 1.0000 mg | ORAL_TABLET | Freq: Every day | ORAL | Status: DC

## 2021-05-24 MED ORDER — CYANOCOBALAMIN 1000 MCG/ML IJ SOLN: 1000.0000 ug | Freq: Every day | INTRAMUSCULAR | Status: DC

## 2021-05-24 MED ORDER — CHLORHEXIDINE GLUCONATE CLOTH 2 % EX PADS
6.0000 | MEDICATED_PAD | Freq: Every day | CUTANEOUS | Status: DC
  Administered 2021-05-24 – 2021-05-31 (×8): 6 via TOPICAL

## 2021-05-24 MED ORDER — SODIUM CHLORIDE 0.9 % IV SOLN
75.0000 mL/h | INTRAVENOUS | Status: DC
  Administered 2021-05-24 – 2021-05-25 (×2): 75 mL/h via INTRAVENOUS

## 2021-05-24 NOTE — Assessment & Plan Note (Signed)
-  Replete and recheck as needed 

## 2021-05-24 NOTE — Progress Notes (Signed)
[name]   DOB:August 31, 1946   KP#:[id number]    ASSESSMENT & PLAN:  Diffuse large B-cell lymphoma, ABC type He has an aggressive subtype of disease and would need chemotherapy to be started soon He is arranged to get echocardiogram, port placement as well as PET CT scan as an outpatient this coming week Unfortunately, clinically, the lymphadenopathy is causing compressive/obstructive uropathy predispose him to recurrent UTI and sepsis Since admission, he has received aggressive IV fluid resuscitation and IV antibiotics and felt overall improved Continue supportive care Dr. Lorenso Courier will round on him tomorrow  Possible early sepsis secondary to UTI Cultures are pending He felt better with broad-spectrum IV antibiotics and IV fluid resuscitation Continue the same  Dehydration, acute on chronic renal failure Is able to increase oral intake since admission Continue IV fluids for now  Mild pancytopenia, component of anemia of chronic illness Observe closely for now  Code Status Full code  Goals of care Resolution of clinical sepsis  Discharge planning He would likely be here for few days  All questions were answered. The patient knows to call the clinic with any problems, questions or concerns.   The total time spent in the appointment was 40 minutes encounter with patients including review of chart and various tests results, discussions about plan of care and coordination of care plan  Heath Lark, MD 05/24/2021 10:23 AM  Subjective: His daughter, Alden Benjamin serve as interpreter The patient has background history of diffuse large B-cell lymphoma and multiple other medical comorbidities He was last seen by Dr. Lorenso Courier on June 29 with plan for outpatient echocardiogram, PET CT scan and other tests He had recurrent admission to the hospital due to infection and sepsis He fell recently and hurt his ribs; he has intermittent pain but it does not bother him too much. He complained of fever,  foul-smelling urine and diffuse weakness and was brought into the emergency department yesterday and was admitted for further management It was difficult to communicate with the patient due to language barrier.  I was not able to get the system from medical interpreter but his daughter speaks reasonable [place] and we communicated via his telephone  Objective:  Vitals:   05/24/21 0611 05/24/21 0613  BP: (!) 149/73   Pulse: 79 84  Resp:  20  Temp:  98.4 F (36.9 C)  SpO2:  95%     Intake/Output Summary (Last 24 hours) at 05/24/2021 1023 Last data filed at 05/24/2021 0408 Gross per 24 hour  Intake 4630.48 ml  Output 800 ml  Net 3830.48 ml    GENERAL:alert, no distress and comfortable.  He has oxygen delivered via nasal cannula SKIN: skin color, texture, turgor are normal, no rashes or significant lesions EYES: normal, Conjunctiva are pink and non-injected, sclera clear OROPHARYNX:no exudate, no erythema and lips, buccal mucosa, and tongue normal  NECK: supple, thyroid normal size, non-tender, without nodularity LYMPH:  no palpable lymphadenopathy in the cervical, axillary or inguinal LUNGS: clear to auscultation and percussion with normal breathing effort HEART: regular rate & rhythm and no murmurs with mild bilateral lower extremity edema ABDOMEN:abdomen soft, non-tender and normal bowel sounds Musculoskeletal:no cyanosis of digits and no clubbing  NEURO: alert & oriented x 3 with fluent speech, no focal motor/sensory deficits   His Foley bag is draining very dirty looking urine  Labs:  Recent Labs    05/13/21 1014 05/23/21 1540 05/24/21 0324  NA 140 135 140  K 4.8 3.9 3.9  CL 107 103 107  CO2 23 24 26   GLUCOSE 370* 190* 196*  BUN 25* 21 23  CREATININE 1.77* 1.73* 1.69*  CALCIUM 8.6* 8.7* 8.6*  GFRNONAA 40* 41* 42*  PROT 5.8* 6.1* 5.6*  ALBUMIN 2.9* 3.6 3.0*  AST 31 16 13*  ALT 58* 23 19  ALKPHOS 82 94 76  BILITOT 0.4 1.4* 1.3*    Studies: I personally reviewed  his CT imaging CT Abdomen Pelvis Wo Contrast  Result Date: 04/30/2021 CLINICAL DATA:  Right lower quadrant abdominal pain EXAM: CT ABDOMEN AND PELVIS WITHOUT CONTRAST TECHNIQUE: Multidetector CT imaging of the abdomen and pelvis was performed following the standard protocol without IV contrast. COMPARISON:  None. FINDINGS: Lower chest: 6 mm mean diameter noncalcified pulmonary nodule is seen within the visualized right middle lobe, indeterminate. Scattered ground-glass pulmonary infiltrates in the noted at the lung bases bilaterally with subtle, smooth interlobular septal thickening possibly representing changes of trace pulmonary edema, possibly cardiogenic in nature. Extensive multi-vessel coronary artery calcification is present with probable stenting of the distal right coronary artery. Global cardiac size within normal limits. No pericardial effusion. Hypoattenuation of the cardiac blood pool is in keeping with at least mild anemia. Hepatobiliary: No focal liver abnormality is seen. No gallstones, gallbladder wall thickening, or biliary dilatation. Pancreas: Unremarkable Spleen: There is moderate splenomegaly with the spleen measuring 17.2 cm in greatest dimension. No definite intrasplenic lesion identified on this noncontrast examination. Adrenals/Urinary Tract: The adrenal glands are unremarkable. The kidneys are normal in size and position. There is mild right hydronephrosis secondary to an obstructing lobulated mass in the region of the right ureteropelvic junction measuring 3.1 x 4.5 x 3.8 cm on axial image # 58 and coronal image # 67 while difficult to accurately characterize in the absence of contrast administration, the mass appears external to the right ureter and demonstrates occlusion of the a proximal right ureter likely related to extrinsic mass effect, best appreciated on coronal imaging. Mild bilateral nonspecific perinephric stranding. No intrarenal or ureteral calculi are identified no  hydronephrosis on the left. Appearing calcifications along the posterior wall of the bladder in the region of the ureteropelvic junction may represent posterior layering calculi within the bladder lumen or may be artifactual and related to streak artifact from right total hip arthroplasty. Stomach/Bowel: The stomach, small bowel, and large bowel are unremarkable. No evidence of obstruction or focal inflammation. The appendix is normal. No free intraperitoneal gas or fluid. Vascular/Lymphatic: There is extensive mesenteric infiltration. Additionally, there is pathologic mesenteric adenopathy with the dominant mass measuring 6.7 x 4.1 cm at axial image # 44/2. Multiple additional pathologically enlarged mesenteric lymph nodes are identified demonstrating irregular margins. There is, additionally, pathologic bilateral common iliac and external iliac lymphadenopathy as well as right pelvic sidewall lymphadenopathy with the index right common iliac lymph node measuring 1.8 x 3.7 cm at axial image # 72/2. Borderline pathologic aortocaval and left periaortic lymphadenopathy is present. There is moderate aortoiliac atherosclerotic calcification. No aortic aneurysm. Reproductive: Mild prostatic enlargement. Seminal vesicles are unremarkable. Other: Soft tissue nodule within the right retroperitoneum adjacent to the right psoas muscle may represent pathologic retroperitoneal adenopathy or a metastatic implant. This measures 1.7 x 3.0 cm at axial image # 63/2. 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 Degenerative changes are seen within the lumbar spine. No lytic or blastic bone lesions are identified. IMPRESSION: Pathologic mesenteric, retroperitoneal, and pelvic adenopathy, as described above. Moderate splenomegaly. Lobulated retroperitoneal soft tissue masses in the region of the right ureteropelvic junction likely  resulting in mild right hydronephrosis secondary to extrinsic mass effect as well as  within the right retroperitoneum. 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 Less likely, this may represent metastatic disease secondary to a primary urothelial malignancy or potentially metastatic disease related to underlying prostate cancer, however, the nodal distribution would be unusual for the former and the lack of osseous metastatic disease given the extensive nodal involvement would be unusual for the latter. PET CT examination may be more helpful for further evaluation as well as identification of a a optimal tissue sampling target. Pathologic right external iliac lymphadenopathy may be amenable to ultrasound-guided sampling for definitive diagnosis. 6 mm of right middle lobe pulmonary nodule. Non-contrast chest CT at 6-12 months is recommended. If the nodule is stable at time of repeat CT, then future CT at 18-24 months (from today's scan) is considered optional for low-risk patients, but is recommended for high-risk patients. This recommendation follows the consensus statement: Guidelines for Management of Incidental Pulmonary Nodules Detected on CT Images: From the Fleischner Society 2017; Radiology 2017; 284:228-243. Extensive coronary artery calcification. Scattered ground-glass pulmonary infiltrates and subtle smooth interlobular septal thickening may reflect changes of mild cardiogenic failure. At least mild anemia. Aortic Atherosclerosis (ICD10-I70.0). Electronically Signed   By: Fidela Salisbury MD   On: 04/30/2021 14:38   US RENAL  Result Date: 05/06/2021 CLINICAL DATA:  Acute on chronic kidney disease EXAM: RENAL / URINARY TRACT ULTRASOUND COMPLETE COMPARISON:  CT 04/30/2021 FINDINGS: Right Kidney: Renal measurements: 11.8 x 7.5 x 5.6 cm = volume: 259.6 mL. Echogenicity within normal limits. Mild right hydronephrosis which is likely improved as compared with recent CT. 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 Left Kidney: Renal measurements: 12.3 x 6 x  5.4 cm = volume: 7.7 mL. Small cyst at the midpole measuring 12 x 14 x 11 mm additional small cyst at the midpole measuring 12 x 9 x 15 mm. Bladder: Appears normal for degree of bladder distention. Other: None. IMPRESSION: 1. Mild right hydronephrosis probably improved as compared with prior CT. Right UPJ region mass on CT is not well demonstrated on sonogram today. 2. Small left renal cysts.  No left hydronephrosis Electronically Signed   By: Donavan Foil M.D.   On: 05/06/2021 21:28   DG Chest Port 1 View  Result Date: 05/23/2021 CLINICAL DATA:  Fever EXAM: PORTABLE CHEST 1 VIEW COMPARISON:  05/06/2021 FINDINGS: Low volume AP portable examination with mild, diffuse bilateral interstitial opacity and heterogeneous opacity in the retrocardiac left lung base. Cardiomegaly. The visualized skeletal structures are unremarkable. IMPRESSION: 1. Low volume AP portable examination with mild, diffuse bilateral interstitial opacity and heterogeneous opacity in the retrocardiac left lung base, which may reflect atelectasis in the setting of cardiomegaly or alternately infection/aspiration. 2. Cardiomegaly. Electronically Signed   By: Eddie Candle M.D.   On: 05/23/2021 15:49   DG Chest Port 1 View  Result Date: 05/06/2021 CLINICAL DATA:  75 year old male with concern for sepsis. EXAM: PORTABLE CHEST 1 VIEW COMPARISON:  None. FINDINGS: No focal consolidation, pleural effusion, or pneumothorax. Borderline cardiomegaly. Atherosclerotic calcification of the aorta. Bilateral hilar prominence, likely dilatation of the main pulmonary arteries and suggestive of pulmonary hypertension. Adenopathy is less likely but not excluded. Attention on follow-up imaging recommended. IMPRESSION: 1. No acute cardiopulmonary process. 2. Borderline cardiomegaly with findings suggestive of pulmonary hypertension. Electronically Signed   By: Anner Crete M.D.   On: 05/06/2021 18:27   DG C-Arm 1-60 Min-No Report  Result Date:  05/01/2021 Fluoroscopy was utilized by the requesting physician.  No radiographic interpretation.   CT Renal Stone Study  Result Date: 05/23/2021 CLINICAL DATA:  Hydronephrosis, weakness, recently completed antibiotics. Persistent fever EXAM: CT ABDOMEN AND PELVIS WITHOUT CONTRAST TECHNIQUE: Multidetector CT imaging of the abdomen and pelvis was performed following the standard protocol without IV contrast. COMPARISON:  CT 04/30/2021, CT 05/04/2021, ultrasound 05/06/2021 FINDINGS: Lower chest: Small bilateral effusions, new from prior. Some additional more consolidative opacity the left lung base, could be atelectatic or airspace disease. Diffuse interlobular septal thickening and fissural thickening. Cardiac size within normal limits. Coronary artery atherosclerosis. No pericardial effusion. Hepatobiliary: No worrisome focal liver lesions. Smooth liver surface contour. Normal hepatic attenuation. Normal gallbladder and biliary tree. Pancreas: Partial fatty replacement of the pancreas. No pancreatic ductal dilatation or surrounding inflammatory changes. Spleen: Normal in size. No concerning splenic lesions. Adrenals/Urinary Tract: Normal adrenal glands. Bilateral perinephric stranding, similar to slightly increased from comparison prior. A right double-J nephroureteral stent is in place at the time of exam. Proximal and appropriately terminating in the renal pelvis. Distal and terminating within the urinary bladder. No residual right hydronephrosis. Redemonstration of a heterogeneous mass at the right ureteropelvic junction which appears external to the ureter and kidney, measuring approximately 5.4 x 3.5 cm in size, previously 4.5 x 3 cm in size. Suspect several left parapelvic cysts. No concerning left pelvic lesion. Few small left renal cyst corresponding well to those seen on comparison ultrasound and CT imaging. Urinary bladder is largely decompressed by inflated Foley catheter. Small amount of joint luminal  gas is nonspecific given instrumentation. Some degree of bladder wall thickening is expected given underdistention though faint perivesicular hazy stranding is present. Portions of the bladder obscured by streak artifact from a right hip arthroplasty. Stomach/Bowel: Distal esophagus, stomach and duodenal sweep are unremarkable. No small bowel wall thickening or dilatation. No evidence of obstruction. A normal appendix is visualized. No colonic dilatation or wall thickening. Scattered colonic diverticula without focal inflammation to suggest diverticulitis. Vascular/Lymphatic: Extensive mesenteric infiltration. Pathologic mesenteric adenopathy throughout the mid mesentery with a larger dominant mass measuring 5.6 by 3.6 cm in size which appears to decreased in the interim having previously measured 6.7 x 4.1 cm at similar level. There is persistent pathologic adenopathy throughout the abdomen and pelvis most pronounced in the periaortic chains and central mesentery. Index right common iliac node measures 1.8 cm in short axis (2/63), not significantly changed from prior. Additional index right pelvic sidewall node measuring 1 cm short axis is unchanged from prior as well (2/75). Atherosclerotic plaque throughout the abdominal aorta and branch vessels. No aneurysm or ectasia. Reproductive: Mild prostatomegaly. Seminal vesicles are unremarkable. Other: Mesenteric infiltration. Perinephric retroperitoneal stranding and trace free fluid in the pararenal spaces. No bowel containing hernia. No free air. Musculoskeletal: The osseous structures appear diffusely demineralized which may limit detection of small or nondisplaced fractures. No acute osseous abnormality or suspicious osseous lesion. Prior right hip arthroplasty in expected alignment. No acute or suspicious osseous lesions. IMPRESSION: 1. 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 A  lobulated retroperitoneal soft tissue mass towards the right ureteropelvic junction with resulting compressive mass effect upon the collecting system has slightly increased in size from prior now measuring 5.4 x 3.5 cm. A double-J nephroureteral stent remains in place without significant residual hydronephrosis at this time. 2. A separate large deposit in the upper mesentery as seemingly decreased in the interim while other  index lymph nodes remain stable from prior. 3. Extensive perinephric, periureteral and perivesicular stranding, concerning for an ascending tract infection in the setting of sepsis. 4. Patchy area of opacity in posterior left lung base, possibly atelectatic or airspace disease with additional features of developing pulmonary edema and bilateral pleural effusions. 5. Prostatomegaly. 6. Prior right hip arthroplasty. 7. Aortic Atherosclerosis (ICD10-I70.0). 8. Coronary atherosclerosis. These results were called by telephone at the time of interpretation on 05/23/2021 at 9:46 pm to provider Doutova MD, who verbally acknowledged these results. Electronically Signed   By: Lovena Le M.D.   On: 05/23/2021 21:46   CT RENAL BIOPSY  Result Date: 05/04/2021 INDICATION: 75 year old male with right infrarenal retroperitoneal mass and abdominal lymphadenopathy of indeterminate etiology. EXAM: CT BIOPSY CORE RENAL COMPARISON:  05/30/2021 MEDICATIONS: None. ANESTHESIA/SEDATION: Fentanyl 100 mcg IV; Versed 2 mg IV Sedation time: 13 minutes; The patient was continuously monitored during the procedure by the interventional radiology nurse under my direct supervision. CONTRAST:  None. COMPLICATIONS: None immediate. PROCEDURE: Informed consent was obtained from the patient following an explanation of the procedure, risks, benefits and alternatives. A time out was performed prior to the initiation of the procedure. The patient was positioned prone on the CT table and a limited CT was performed for procedural  planning demonstrating similar appearing soft tissue mass about the inferior aspect of the right kidney which appears to surround the proximal right ureter. The procedure was planned. The operative site was prepped and draped in the usual sterile fashion. Appropriate trajectory was confirmed with a 22 gauge spinal needle after the adjacent tissues were anesthetized with 1% Lidocaine with epinephrine. Under intermittent CT guidance, a 17 gauge coaxial needle was advanced into the peripheral aspect of the mass. Appropriate positioning was confirmed and total of 3 samples were obtained with an 18 gauge core needle biopsy device. The co-axial needle was removed while injecting Gel-Foam slurry and hemostasis was achieved with manual compression. A limited postprocedural CT was negative for hemorrhage or additional complication. A dressing was placed. The patient tolerated the procedure well without immediate postprocedural complication. IMPRESSION: Technically successful CT guided core needle biopsy of right infrarenal retroperitoneal mass. Ruthann Cancer, MD [facility] Electronically Signed   By: Ruthann Cancer MD   On: 05/04/2021 14:23

## 2021-05-24 NOTE — Assessment & Plan Note (Signed)
-   s/p right ureteral stent placement on 6/17

## 2021-05-24 NOTE — Progress Notes (Signed)
Progress Note    [name]   ASN:[id number]  DOB: Sep 04, 1946  DOA: 05/23/2021     1  PCP: [name], MD  CC: fever   Hospital Course: [name] is a 75  yo [place] speaking male with PMH recent diagnosis DLBCL (ABC type), CAD s/p PCI with DES to RCA (11/2020), PAF (on Eliquis and amio), CKDIIIa, DMII, HTN, OSA who presented to the hospital with fever.  Recent readmissions from 6/16 - 6/21 and 6/22 - 6/26.  He recently underwent right ureteral stent placement due to tumor burden from retroperitoneal pelvic adenopathy associated with his lymphoma.  He underwent work-up on admission and was felt to have concern for sepsis due to UTI and is admitted for antibiotic treatment and evaluation by urology and oncology.  Interval History:  Seen in his room this morning.  iPad interpreter used to communicate in [place] with the patient. He denied any pain or shortness of breath.  Specifically no back pain.  No urinary symptoms when voiding either.  Denied any hematuria. Reviewed overall plan with him including fluids, antibiotics.  Questions answered.  No family present this morning.  ROS: Constitutional: positive for fatigue and malaise, negative for chills, Respiratory: negative for cough, Cardiovascular: negative for chest pain, and Gastrointestinal: negative for abdominal pain  Assessment & Plan: * Sepsis ([place]) - fever, tachypnea, leukopenia, source considered urinary - s/p CT renal study (double-J nephroureteral stent in place without residual hydronephrosis) but does show extensive perinephric, periureteral and perivesicular stranding -Continue cefepime - Follow-up cultures -Continue fluids  DLBCL (diffuse large B cell lymphoma) ([place]) - Recent diagnosis on needle core biopsy of right retroperitoneal lymph node on 05/04/2021.  Has followed up to establish with Dr. Lorenso Courier, seen on 05/13/2021.  Staging still in process with pending PET scan and Port-A-Cath to be  placed -Has been admitted to the hospital twice in June 2022 and now again this hospitalization due to underlying aggressive subtype of disease and in need of starting chemo soon per oncology -Oncology following during hospitalization, appreciate assistance  Acute renal failure superimposed on stage 3a chronic kidney disease ([place]) - patient has history of CKD3a. Baseline creat ~ 1.4, eGFR 53 - patient presents with increase in creat >0.3 mg/dL above baseline presumed to have occurred within past 7 days PTA - creat 1.73 and has been ~1.8 during recent hospitalizations; etiology still presumed due to mass effect/compression from retroperitoneal pelvic adenopathy which is increased in size (now measuring 5.4 x 3.5 cm) - ultimately needs lymphoma treatment; for now continue IVF and treating infection as able   Hydronephrosis of right kidney - s/p right ureteral stent placement on 6/17   BPH (benign prostatic hyperplasia) On- also recent hydronephrosis due to tumor burden; he is s/p right ureteral placement on 6/17 with Dr. Junious Silk -Hydronephrosis shows improvement in admission CT renal stone study - Urology following, appreciate assistance - Foley to remain in place per urology rec's until TOV deferred to urology -Continue finasteride.  Recommended to add back silodosin 8 mg daily once able/stable prior to void trial -Continue Flomax  Normocytic anemia - folate (7.7), B12 (312) - iron stores low - likely little benefit for folate/b12 replacement at this time, hold off as well - hold off on iron for now given active malignancy  Prolonged QT interval - continue tele   Hypomagnesemia - Replete and recheck as needed  Hyperlipidemia associated with type 2 diabetes mellitus ([place]) - Continue Lipitor for now in setting of recent stenting but  mortality benefit is questionable at this point  Sleep apnea - continue CPAP qhs  Complicated UTI (urinary tract infection) - see sepsis  PAF  (paroxysmal atrial fibrillation) ([facility]) - continue amio and Eliquis  - will need to hold Eliquis if any plans for surgical intervention   DM2 (diabetes mellitus, type 2) ([place]) - continue Lantus, SSI, and CBG monitoring - A1c = 8.1%  CAD (coronary artery disease) - s/p recent PCI to RCA in Jan 2022 - continue statin, plavix, eliquis    Old records reviewed in assessment of this patient  Antimicrobials: Cefepime 05/23/2021 >> current  DVT prophylaxis: SCDs Start: 05/23/21 2140 apixaban (ELIQUIS) tablet 5 mg   Code Status:   Code Status: Full Code Family Communication:   Disposition Plan: Status is: Inpatient  Remains inpatient appropriate because:IV treatments appropriate due to intensity of illness or inability to take PO and Inpatient level of care appropriate due to severity of illness  Dispo: The patient is from: Home              Anticipated d/c is to: Home              Patient currently is not medically stable to d/c.   Difficult to place patient No      Risk of unplanned readmission score: Unplanned Admission- Pilot do not use: 22.56   Objective: Blood pressure 132/66, pulse 80, temperature 98.3 F (36.8 C), temperature source Oral, resp. rate (!) 25, SpO2 98 %.  Examination: General appearance: alert, cooperative, and no distress Head: Normocephalic, without obvious abnormality, atraumatic Eyes:  EOMI Lungs: clear to auscultation bilaterally Heart: regular rate and rhythm and S1, S2 normal Back: No CVA tenderness Abdomen: normal findings: bowel sounds normal and soft, non-tender Extremities:  No edema Skin: mobility and turgor normal Neurologic: Grossly normal  Consultants:  Urology Oncology  Procedures:    Data Reviewed: I have personally reviewed following labs and imaging studies Results for orders placed or performed during the hospital encounter of 05/23/21 (from the past 24 hour(s))  CBC with Differential     Status: Abnormal   Collection  Time: 05/23/21  3:40 PM  Result Value Ref Range   WBC 5.2 4.0 - 10.5 K/uL   RBC 4.03 (L) 4.22 - 5.81 MIL/uL   Hemoglobin 10.4 (L) 13.0 - 17.0 g/dL   HCT 32.8 (L) 39.0 - 52.0 %   MCV 81.4 80.0 - 100.0 fL   MCH 25.8 (L) 26.0 - 34.0 pg   MCHC 31.7 30.0 - 36.0 g/dL   RDW 17.2 (H) 11.5 - 15.5 %   Platelets 154 150 - 400 K/uL   nRBC 0.0 0.0 - 0.2 %   Neutrophils Relative % 50 %   Neutro Abs 2.7 1.7 - 7.7 K/uL   Lymphocytes Relative 23 %   Lymphs Abs 1.2 0.7 - 4.0 K/uL   Monocytes Relative 26 %   Monocytes Absolute 1.3 (H) 0.1 - 1.0 K/uL   Eosinophils Relative 0 %   Eosinophils Absolute 0.0 0.0 - 0.5 K/uL   Basophils Relative 0 %   Basophils Absolute 0.0 0.0 - 0.1 K/uL   Immature Granulocytes 1 %   Abs Immature Granulocytes 0.04 0.00 - 0.07 K/uL  Comprehensive metabolic panel     Status: Abnormal   Collection Time: 05/23/21  3:40 PM  Result Value Ref Range   Sodium 135 135 - 145 mmol/L   Potassium 3.9 3.5 - 5.1 mmol/L   Chloride 103 98 - 111 mmol/L  CO2 24 22 - 32 mmol/L   Glucose, Bld 190 (H) 70 - 99 mg/dL   BUN 21 8 - 23 mg/dL   Creatinine, Ser 1.73 (H) 0.61 - 1.24 mg/dL   Calcium 8.7 (L) 8.9 - 10.3 mg/dL   Total Protein 6.1 (L) 6.5 - 8.1 g/dL   Albumin 3.6 3.5 - 5.0 g/dL   AST 16 15 - 41 U/L   ALT 23 0 - 44 U/L   Alkaline Phosphatase 94 38 - 126 U/L   Total Bilirubin 1.4 (H) 0.3 - 1.2 mg/dL   GFR, Estimated 41 (L) >60 mL/min   Anion gap 8 5 - 15  Lactic acid, plasma     Status: None   Collection Time: 05/23/21  3:40 PM  Result Value Ref Range   Lactic Acid, Venous 1.4 0.5 - 1.9 mmol/L  Protime-INR     Status: Abnormal   Collection Time: 05/23/21  3:40 PM  Result Value Ref Range   Prothrombin Time 20.0 (H) 11.4 - 15.2 seconds   INR 1.7 (H) 0.8 - 1.2  APTT     Status: None   Collection Time: 05/23/21  3:40 PM  Result Value Ref Range   aPTT 35 24 - 36 seconds  Hemoglobin A1c     Status: Abnormal   Collection Time: 05/23/21  3:40 PM  Result Value Ref Range   Hgb  A1c MFr Bld 8.1 (H) 4.8 - 5.6 %   Mean Plasma Glucose 185.77 mg/dL  Procalcitonin     Status: None   Collection Time: 05/23/21  3:40 PM  Result Value Ref Range   Procalcitonin 0.49 ng/mL  Urinalysis, Complete w Microscopic Urine, Clean Catch     Status: Abnormal   Collection Time: 05/23/21  3:41 PM  Result Value Ref Range   Color, Urine YELLOW YELLOW   APPearance HAZY (A) CLEAR   Specific Gravity, Urine 1.012 1.005 - 1.030   pH 6.0 5.0 - 8.0   Glucose, UA NEGATIVE NEGATIVE mg/dL   Hgb urine dipstick LARGE (A) NEGATIVE   Bilirubin Urine NEGATIVE NEGATIVE   Ketones, ur NEGATIVE NEGATIVE mg/dL   Protein, ur 100 (A) NEGATIVE mg/dL   Nitrite POSITIVE (A) NEGATIVE   Leukocytes,Ua LARGE (A) NEGATIVE   RBC / HPF >50 (H) 0 - 5 RBC/hpf   WBC, UA >50 (H) 0 - 5 WBC/hpf   Bacteria, UA MANY (A) NONE SEEN   Squamous Epithelial / LPF 0-5 0 - 5  Resp Panel by RT-PCR (Flu A&B, Covid) Urine, Clean Catch     Status: None   Collection Time: 05/23/21  3:41 PM   Specimen: Urine, Clean Catch; Nasopharyngeal(NP) swabs in vial transport medium  Result Value Ref Range   SARS Coronavirus 2 by RT PCR NEGATIVE NEGATIVE   Influenza A by PCR NEGATIVE NEGATIVE   Influenza B by PCR NEGATIVE NEGATIVE  Culture, blood (x 2)     Status: None (Preliminary result)   Collection Time: 05/23/21  3:43 PM   Specimen: BLOOD  Result Value Ref Range   Specimen Description      BLOOD RIGHT ANTECUBITAL Performed at [facility], [address], [place], [place] [address]    Special Requests      BOTTLES DRAWN AEROBIC AND ANAEROBIC Blood Culture adequate volume Performed at [facility] [address], [place], [place] [address]    Culture      NO GROWTH < 12 HOURS Performed at [facility]  [address] [place], [place] [address]    Report Status PENDING   Culture, blood (x 2)     Status: None (Preliminary result)   Collection Time: 05/23/21  3:45 PM   Specimen: BLOOD LEFT ARM   Result Value Ref Range   Specimen Description      BLOOD LEFT ARM Performed at [facility] [address] [place], [place] [address]    Special Requests      BOTTLES DRAWN AEROBIC AND ANAEROBIC Blood Culture adequate volume Performed at [facility] [address] [place], [place] [address]    Culture      NO GROWTH < 12 HOURS Performed at [facility] [address], [place], [place] [address]    Report Status PENDING   MRSA Next Gen by PCR, Nasal     Status: None   Collection Time: 05/23/21  6:56 PM   Specimen: Nasal Mucosa; Nasal Swab  Result Value Ref Range   MRSA by PCR Next Gen NOT DETECTED NOT DETECTED  CK     Status: Abnormal   Collection Time: 05/23/21  7:01 PM  Result Value Ref Range   Total CK 37 (L) 49 - 397 U/L  Magnesium     Status: Abnormal   Collection Time: 05/23/21  7:01 PM  Result Value Ref Range   Magnesium 1.6 (L) 1.7 - 2.4 mg/dL  Phosphorus     Status: None   Collection Time: 05/23/21  7:01 PM  Result Value Ref Range   Phosphorus 3.0 2.5 - 4.6 mg/dL  CBG monitoring, ED     Status: Abnormal   Collection Time: 05/23/21  8:46 PM  Result Value Ref Range   Glucose-Capillary 166 (H) 70 - 99 mg/dL  Glucose, capillary     Status: Abnormal   Collection Time: 05/23/21  9:58 PM  Result Value Ref Range   Glucose-Capillary 193 (H) 70 - 99 mg/dL  Glucose, capillary     Status: Abnormal   Collection Time: 05/24/21 12:18 AM  Result Value Ref Range   Glucose-Capillary 192 (H) 70 - 99 mg/dL  Vitamin B12     Status: None   Collection Time: 05/24/21  3:24 AM  Result Value Ref Range   Vitamin B-12 312 180 - 914 pg/mL  Folate     Status: None   Collection Time: 05/24/21  3:24 AM  Result Value Ref Range   Folate 7.7 >5.9 ng/mL  Iron and TIBC     Status: Abnormal   Collection Time: 05/24/21  3:24 AM  Result Value Ref Range   Iron 13 (L) 45 - 182 ug/dL   TIBC 222 (L) 250 - 450 ug/dL   Saturation Ratios 6 (L) 17.9  - 39.5 %   UIBC 209 ug/dL  Ferritin     Status: None   Collection Time: 05/24/21  3:24 AM  Result Value Ref Range   Ferritin 205 24 - 336 ng/mL  Reticulocytes     Status: Abnormal   Collection Time: 05/24/21  3:24 AM  Result Value Ref Range   Retic Ct Pct 3.4 (H) 0.4 - 3.1 %   RBC. 3.84 (L) 4.22 - 5.81 MIL/uL   Retic Count, Absolute 130.2 19.0 - 186.0 K/uL   Immature Retic Fract 16.9 (H) 2.3 - 15.9 %  Magnesium     Status: None   Collection Time: 05/24/21  3:24 AM  Result Value Ref Range   Magnesium 2.0 1.7 - 2.4 mg/dL  Phosphorus  Status: Abnormal   Collection Time: 05/24/21  3:24 AM  Result Value Ref Range   Phosphorus 5.0 (H) 2.5 - 4.6 mg/dL  CBC WITH DIFFERENTIAL     Status: Abnormal   Collection Time: 05/24/21  3:24 AM  Result Value Ref Range   WBC 3.7 (L) 4.0 - 10.5 K/uL   RBC 3.90 (L) 4.22 - 5.81 MIL/uL   Hemoglobin 10.1 (L) 13.0 - 17.0 g/dL   HCT 32.0 (L) 39.0 - 52.0 %   MCV 82.1 80.0 - 100.0 fL   MCH 25.9 (L) 26.0 - 34.0 pg   MCHC 31.6 30.0 - 36.0 g/dL   RDW 17.2 (H) 11.5 - 15.5 %   Platelets 154 150 - 400 K/uL   nRBC 0.0 0.0 - 0.2 %   Neutrophils Relative % 30 %   Neutro Abs 1.1 (L) 1.7 - 7.7 K/uL   Lymphocytes Relative 30 %   Lymphs Abs 1.1 0.7 - 4.0 K/uL   Monocytes Relative 38 %   Monocytes Absolute 1.4 (H) 0.1 - 1.0 K/uL   Eosinophils Relative 0 %   Eosinophils Absolute 0.0 0.0 - 0.5 K/uL   Basophils Relative 1 %   Basophils Absolute 0.0 0.0 - 0.1 K/uL   Immature Granulocytes 1 %   Abs Immature Granulocytes 0.03 0.00 - 0.07 K/uL  TSH     Status: None   Collection Time: 05/24/21  3:24 AM  Result Value Ref Range   TSH 1.664 0.350 - 4.500 uIU/mL  Comprehensive metabolic panel     Status: Abnormal   Collection Time: 05/24/21  3:24 AM  Result Value Ref Range   Sodium 140 135 - 145 mmol/L   Potassium 3.9 3.5 - 5.1 mmol/L   Chloride 107 98 - 111 mmol/L   CO2 26 22 - 32 mmol/L   Glucose, Bld 196 (H) 70 - 99 mg/dL   BUN 23 8 - 23 mg/dL   Creatinine,  Ser 1.69 (H) 0.61 - 1.24 mg/dL   Calcium 8.6 (L) 8.9 - 10.3 mg/dL   Total Protein 5.6 (L) 6.5 - 8.1 g/dL   Albumin 3.0 (L) 3.5 - 5.0 g/dL   AST 13 (L) 15 - 41 U/L   ALT 19 0 - 44 U/L   Alkaline Phosphatase 76 38 - 126 U/L   Total Bilirubin 1.3 (H) 0.3 - 1.2 mg/dL   GFR, Estimated 42 (L) >60 mL/min   Anion gap 7 5 - 15  Glucose, capillary     Status: Abnormal   Collection Time: 05/24/21  4:00 AM  Result Value Ref Range   Glucose-Capillary 181 (H) 70 - 99 mg/dL  Glucose, capillary     Status: Abnormal   Collection Time: 05/24/21  9:24 AM  Result Value Ref Range   Glucose-Capillary 211 (H) 70 - 99 mg/dL  Glucose, capillary     Status: Abnormal   Collection Time: 05/24/21 12:22 PM  Result Value Ref Range   Glucose-Capillary 226 (H) 70 - 99 mg/dL    Recent Results (from the past 240 hour(s))  Resp Panel by RT-PCR (Flu A&B, Covid) Urine, Clean Catch     Status: None   Collection Time: 05/23/21  3:41 PM   Specimen: Urine, Clean Catch; Nasopharyngeal(NP) swabs in vial transport medium  Result Value Ref Range Status   SARS Coronavirus 2 by RT PCR NEGATIVE NEGATIVE Final    Comment: (NOTE) SARS-CoV-2 target nucleic acids are NOT DETECTED.  The SARS-CoV-2 RNA is generally detectable in upper respiratory specimens during  the acute phase of infection. The lowest concentration of SARS-CoV-2 viral copies this assay can detect is 138 copies/mL. A negative result does not preclude SARS-Cov-2 infection and should not be used as the sole basis for treatment or other patient management decisions. A negative result may occur with  improper specimen collection/handling, submission of specimen other than nasopharyngeal swab, presence of viral mutation(s) within the areas targeted by this assay, and inadequate number of viral copies(<138 copies/mL). A negative result must be combined with clinical observations, patient history, and epidemiological information. The expected result is  Negative.  Fact Sheet for Patients:  EntrepreneurPulse.com.au  Fact Sheet for Healthcare Providers:  IncredibleEmployment.be  This test is no t yet approved or cleared by the [place] FDA and  has been authorized for detection and/or diagnosis of SARS-CoV-2 by FDA under an Emergency Use Authorization (EUA). This EUA will remain  in effect (meaning this test can be used) for the duration of the COVID-19 declaration under Section 564(b)(1) of the Act, 21 U.S.C.section 360bbb-3(b)(1), unless the authorization is terminated  or revoked sooner.       Influenza A by PCR NEGATIVE NEGATIVE Final   Influenza B by PCR NEGATIVE NEGATIVE Final    Comment: (NOTE) The Xpert Xpress SARS-CoV-2/FLU/RSV plus assay is intended as an aid in the diagnosis of influenza from Nasopharyngeal swab specimens and should not be used as a sole basis for treatment. Nasal washings and aspirates are unacceptable for Xpert Xpress SARS-CoV-2/FLU/RSV testing.  Fact Sheet for Patients: EntrepreneurPulse.com.au  Fact Sheet for Healthcare Providers: IncredibleEmployment.be  This test is not yet approved or cleared by the [place] FDA and has been authorized for detection and/or diagnosis of SARS-CoV-2 by FDA under an Emergency Use Authorization (EUA). This EUA will remain in effect (meaning this test can be used) for the duration of the COVID-19 declaration under Section 564(b)(1) of the Act, 21 U.S.C. section 360bbb-3(b)(1), unless the authorization is terminated or revoked.  Performed at [facility], [place] [address], [place], [place] [address]   Culture, blood (x 2)     Status: None (Preliminary result)   Collection Time: 05/23/21  3:43 PM   Specimen: BLOOD  Result Value Ref Range Status   Specimen Description   Final    BLOOD RIGHT ANTECUBITAL Performed at [facility], [place] [address]  [place], [place] [address]    Special Requests   Final    BOTTLES DRAWN AEROBIC AND ANAEROBIC Blood Culture adequate volume Performed at [facility] [address], [place], [place] [address]    Culture   Final    NO GROWTH < 12 HOURS Performed at [facility] [address] [place], [place] [address]    Report Status PENDING  Incomplete  Culture, blood (x 2)     Status: None (Preliminary result)   Collection Time: 05/23/21  3:45 PM   Specimen: BLOOD LEFT ARM  Result Value Ref Range Status   Specimen Description   Final    BLOOD LEFT ARM Performed at [facility], [place] [address], [place], [place] [address]    Special Requests   Final    BOTTLES DRAWN AEROBIC AND ANAEROBIC Blood Culture adequate volume Performed at [facility] [address] [place], [place] [address]    Culture   Final    NO GROWTH < 12 HOURS Performed at [facility] [address], [place], [place] [address]  Report Status PENDING  Incomplete  MRSA Next Gen by PCR, Nasal     Status: None   Collection Time: 05/23/21  6:56 PM   Specimen: Nasal Mucosa; Nasal Swab  Result Value Ref Range Status   MRSA by PCR Next Gen NOT DETECTED NOT DETECTED Final    Comment: (NOTE) The GeneXpert MRSA Assay (FDA approved for NASAL specimens only), is one component of a comprehensive MRSA colonization surveillance program. It is not intended to diagnose MRSA infection nor to guide or monitor treatment for MRSA infections. Test performance is not FDA approved in patients less than 53 years old. Performed at [facility], [place] [address] [place], [place] [address]      Radiology Studies: DG Chest Port 1 View  Result Date: 05/23/2021 CLINICAL DATA:  Fever EXAM: PORTABLE CHEST 1 VIEW COMPARISON:  05/06/2021 FINDINGS: Low volume AP portable examination with mild, diffuse bilateral interstitial opacity and heterogeneous opacity in the  retrocardiac left lung base. Cardiomegaly. The visualized skeletal structures are unremarkable. IMPRESSION: 1. Low volume AP portable examination with mild, diffuse bilateral interstitial opacity and heterogeneous opacity in the retrocardiac left lung base, which may reflect atelectasis in the setting of cardiomegaly or alternately infection/aspiration. 2. Cardiomegaly. Electronically Signed   By: Eddie Candle M.D.   On: 05/23/2021 15:49   CT Renal Stone Study  Result Date: 05/23/2021 CLINICAL DATA:  Hydronephrosis, weakness, recently completed antibiotics. Persistent fever EXAM: CT ABDOMEN AND PELVIS WITHOUT CONTRAST TECHNIQUE: Multidetector CT imaging of the abdomen and pelvis was performed following the standard protocol without IV contrast. COMPARISON:  CT 04/30/2021, CT 05/04/2021, ultrasound 05/06/2021 FINDINGS: Lower chest: Small bilateral effusions, new from prior. Some additional more consolidative opacity the left lung base, could be atelectatic or airspace disease. Diffuse interlobular septal thickening and fissural thickening. Cardiac size within normal limits. Coronary artery atherosclerosis. No pericardial effusion. Hepatobiliary: No worrisome focal liver lesions. Smooth liver surface contour. Normal hepatic attenuation. Normal gallbladder and biliary tree. Pancreas: Partial fatty replacement of the pancreas. No pancreatic ductal dilatation or surrounding inflammatory changes. Spleen: Normal in size. No concerning splenic lesions. Adrenals/Urinary Tract: Normal adrenal glands. Bilateral perinephric stranding, similar to slightly increased from comparison prior. A right double-J nephroureteral stent is in place at the time of exam. Proximal and appropriately terminating in the renal pelvis. Distal and terminating within the urinary bladder. No residual right hydronephrosis. Redemonstration of a heterogeneous mass at the right ureteropelvic junction which appears external to the ureter and kidney,  measuring approximately 5.4 x 3.5 cm in size, previously 4.5 x 3 cm in size. Suspect several left parapelvic cysts. No concerning left pelvic lesion. Few small left renal cyst corresponding well to those seen on comparison ultrasound and CT imaging. Urinary bladder is largely decompressed by inflated Foley catheter. Small amount of joint luminal gas is nonspecific given instrumentation. Some degree of bladder wall thickening is expected given underdistention though faint perivesicular hazy stranding is present. Portions of the bladder obscured by streak artifact from a right hip arthroplasty. Stomach/Bowel: Distal esophagus, stomach and duodenal sweep are unremarkable. No small bowel wall thickening or dilatation. No evidence of obstruction. A normal appendix is visualized. No colonic dilatation or wall thickening. Scattered colonic diverticula without focal inflammation to suggest diverticulitis. Vascular/Lymphatic: Extensive mesenteric infiltration. Pathologic mesenteric adenopathy throughout the mid mesentery with a larger dominant mass measuring 5.6 by 3.6 cm in size which appears to decreased in the interim having previously measured 6.7 x 4.1 cm at similar level. There is  persistent pathologic adenopathy throughout the abdomen and pelvis most pronounced in the periaortic chains and central mesentery. Index right common iliac node measures 1.8 cm in short axis (2/63), not significantly changed from prior. Additional index right pelvic sidewall node measuring 1 cm short axis is unchanged from prior as well (2/75). Atherosclerotic plaque throughout the abdominal aorta and branch vessels. No aneurysm or ectasia. Reproductive: Mild prostatomegaly. Seminal vesicles are unremarkable. Other: Mesenteric infiltration. Perinephric retroperitoneal stranding and trace free fluid in the pararenal spaces. No bowel containing hernia. No free air. Musculoskeletal: The osseous structures appear diffusely demineralized which may  limit detection of small or nondisplaced fractures. No acute osseous abnormality or suspicious osseous lesion. Prior right hip arthroplasty in expected alignment. No acute or suspicious osseous lesions. IMPRESSION: 1. 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 A lobulated retroperitoneal soft tissue mass towards the right ureteropelvic junction with resulting compressive mass effect upon the collecting system has slightly increased in size from prior now measuring 5.4 x 3.5 cm. A double-J nephroureteral stent remains in place without significant residual hydronephrosis at this time. 2. A separate large deposit in the upper mesentery as seemingly decreased in the interim while other index lymph nodes remain stable from prior. 3. Extensive perinephric, periureteral and perivesicular stranding, concerning for an ascending tract infection in the setting of sepsis. 4. Patchy area of opacity in posterior left lung base, possibly atelectatic or airspace disease with additional features of developing pulmonary edema and bilateral pleural effusions. 5. Prostatomegaly. 6. Prior right hip arthroplasty. 7. Aortic Atherosclerosis (ICD10-I70.0). 8. Coronary atherosclerosis. These results were called by telephone at the time of interpretation on 05/23/2021 at 9:46 pm to provider Doutova MD, who verbally acknowledged these results. Electronically Signed   By: Lovena Le M.D.   On: 05/23/2021 21:46   CT Renal Stone Study  Final Result    DG Chest Port 1 View  Final Result      Scheduled Meds:  amiodarone  200 mg Oral q morning   apixaban  5 mg Oral BID   atorvastatin  20 mg Oral QHS   Chlorhexidine Gluconate Cloth  6 each Topical Daily   clopidogrel  75 mg Oral Daily   finasteride  5 mg Oral QHS   insulin aspart  0-9 Units Subcutaneous Q4H   insulin glargine  25 Units Subcutaneous QHS   metoprolol tartrate  50 mg Oral BID   tamsulosin   0.4 mg Oral QPC supper   PRN Meds: acetaminophen **OR** acetaminophen, HYDROcodone-acetaminophen Continuous Infusions:  sodium chloride     ceFEPime (MAXIPIME) IV 2 g (05/23/21 2030)     LOS: 1 day  Time spent: Greater than 50% of the 35 minute visit was spent in counseling/coordination of care for the patient as laid out in the A&P.   Dwyane Dee, MD [facility] 05/24/2021, 1:10 PM

## 2021-05-24 NOTE — Progress Notes (Signed)
Patient refused CPAP for the night. Patient wearing oxygen set at 2lpm with Sp02-99%

## 2021-05-24 NOTE — Assessment & Plan Note (Addendum)
-  patient has history of CKD3a. Baseline creat ~ 1.4, eGFR 53 - patient presents with increase in creat >0.3 mg/dL above baseline presumed to have occurred within past 7 days PTA - creat 1.73 and has been ~1.8 during recent hospitalizations; etiology still presumed due to mass effect/compression from retroperitoneal pelvic adenopathy which is increased in size (now measuring 5.4 x 3.5 cm) - ultimately needs lymphoma treatment - d/c IVF on 7/12 and monitor BMP

## 2021-05-24 NOTE — Progress Notes (Signed)
PHARMACY - PHYSICIAN COMMUNICATION CRITICAL VALUE ALERT - BLOOD CULTURE IDENTIFICATION (BCID)  [name] is an 75 y.o. male who presented to [facility] on 05/23/2021 with a chief complaint of fever, urosepsis. History of DLBCL, recent admissions with pseudomonal UTI and bacteremia.  Assessment:  Blood cultures with Pseudomonas aeruginosa.  Name of physician (or Provider) Contacted: Dr. Sabino Gasser  Current antibiotics: Cefepime  Changes to prescribed antibiotics recommended:  Patient is on recommended antibiotics - No changes needed  Results for orders placed or performed during the hospital encounter of 05/23/21  Blood Culture ID Panel (Reflexed) (Collected: 05/23/2021  3:43 PM)  Result Value Ref Range   Enterococcus faecalis NOT DETECTED NOT DETECTED   Enterococcus Faecium NOT DETECTED NOT DETECTED   Listeria monocytogenes NOT DETECTED NOT DETECTED   Staphylococcus species NOT DETECTED NOT DETECTED   Staphylococcus aureus (BCID) NOT DETECTED NOT DETECTED   Staphylococcus epidermidis NOT DETECTED NOT DETECTED   Staphylococcus lugdunensis NOT DETECTED NOT DETECTED   Streptococcus species NOT DETECTED NOT DETECTED   Streptococcus agalactiae NOT DETECTED NOT DETECTED   Streptococcus pneumoniae NOT DETECTED NOT DETECTED   Streptococcus pyogenes NOT DETECTED NOT DETECTED   A.calcoaceticus-baumannii NOT DETECTED NOT DETECTED   Bacteroides fragilis NOT DETECTED NOT DETECTED   Enterobacterales NOT DETECTED NOT DETECTED   Enterobacter cloacae complex NOT DETECTED NOT DETECTED   Escherichia coli NOT DETECTED NOT DETECTED   Klebsiella aerogenes NOT DETECTED NOT DETECTED   Klebsiella oxytoca NOT DETECTED NOT DETECTED   Klebsiella pneumoniae NOT DETECTED NOT DETECTED   Proteus species NOT DETECTED NOT DETECTED   Salmonella species NOT DETECTED NOT DETECTED   Serratia marcescens NOT DETECTED NOT DETECTED   Haemophilus influenzae NOT DETECTED NOT DETECTED   Neisseria meningitidis NOT  DETECTED NOT DETECTED   Pseudomonas aeruginosa DETECTED (A) NOT DETECTED   Stenotrophomonas maltophilia NOT DETECTED NOT DETECTED   Candida albicans NOT DETECTED NOT DETECTED   Candida auris NOT DETECTED NOT DETECTED   Candida glabrata NOT DETECTED NOT DETECTED   Candida krusei NOT DETECTED NOT DETECTED   Candida parapsilosis NOT DETECTED NOT DETECTED   Candida tropicalis NOT DETECTED NOT DETECTED   Cryptococcus neoformans/gattii NOT DETECTED NOT DETECTED   CTX-M ESBL NOT DETECTED NOT DETECTED   Carbapenem resistance IMP NOT DETECTED NOT DETECTED   Carbapenem resistance KPC NOT DETECTED NOT DETECTED   Carbapenem resistance NDM NOT DETECTED NOT DETECTED   Carbapenem resistance VIM NOT DETECTED NOT DETECTED    Gretta Arab PharmD, BCPS Clinical Pharmacist [facility] main pharmacy [phone] 05/24/2021 5:17 PM

## 2021-05-24 NOTE — Assessment & Plan Note (Addendum)
-   Continue Lipitor for now in setting of recent stenting but mortality benefit is questionable at this point

## 2021-05-24 NOTE — Hospital Course (Addendum)
Mr. [name] is a 75  yo [place] speaking male with PMH recent diagnosis DLBCL (ABC type), CAD s/p PCI with DES to RCA (11/2020), PAF (on Eliquis and amio), CKDIIIa, DMII, HTN, OSA who presented to the hospital with fever.  Recent readmissions from 6/16 - 6/21 and 6/22 - 6/26.  He recently underwent right ureteral stent placement 05/01/21 due to tumor burden from retroperitoneal pelvic adenopathy associated with his lymphoma.  He underwent work-up on admission and was felt to have concern for sepsis due to UTI and is admitted for antibiotic treatment and evaluation by urology and oncology.

## 2021-05-24 NOTE — Assessment & Plan Note (Addendum)
-   folate (7.7), B12 (312) - iron stores low - likely little benefit for folate/b12 replacement at this time, hold off as well - hold off on iron for now given active malignancy

## 2021-05-24 NOTE — Assessment & Plan Note (Addendum)
-   continue Lantus, SSI, and CBG monitoring - A1c = 8.1%

## 2021-05-24 NOTE — Progress Notes (Signed)
  Patient received from ED.   05/23/21 2140  Vitals  Temp (!) 103.1 F (39.5 C)  Temp Source Oral  BP (!) 165/62  MAP (mmHg) 90  BP Location Right Arm  BP Method Automatic  Patient Position (if appropriate) Lying  Pulse Rate 99  Resp 20  MEWS COLOR  MEWS Score Color Yellow  Oxygen Therapy  SpO2 96 %  O2 Device Nasal Cannula  O2 Flow Rate (L/min) 2 L/min

## 2021-05-24 NOTE — Progress Notes (Signed)
*  PRELIMINARY RESULTS* Echocardiogram 2D Echocardiogram has been performed.  Luisa Hart RDCS 05/24/2021, 2:18 PM

## 2021-05-24 NOTE — Assessment & Plan Note (Signed)
-   continue tele

## 2021-05-24 NOTE — Progress Notes (Signed)
Patient refused CPAP for the night  

## 2021-05-24 NOTE — Assessment & Plan Note (Signed)
-   see sepsis 

## 2021-05-24 NOTE — Assessment & Plan Note (Addendum)
-   Recent diagnosis on needle core biopsy of right retroperitoneal lymph node on 05/04/2021.  Has followed up to establish with Dr. Lorenso Courier, seen on 05/13/2021.  Staging still in process with pending PET scan outpatient and Port-A-Cath to be placed -Has been admitted to the hospital twice in June 2022 and now again this hospitalization due to underlying aggressive subtype of disease and in need of starting chemo soon per oncology -Oncology following during hospitalization, appreciate assistance

## 2021-05-24 NOTE — Assessment & Plan Note (Signed)
-   continue CPAP qhs 

## 2021-05-24 NOTE — Assessment & Plan Note (Addendum)
On- also recent hydronephrosis due to tumor burden; he is s/p right ureteral placement on 6/17 with Dr. Junious Silk -Hydronephrosis shows improvement in admission CT renal stone study - Urology following, appreciate assistance - Foley to remain in place per urology rec's until TOV deferred to urology -Continue finasteride.  Recommended to add back silodosin 8 mg daily once able/stable prior to void trial -Continue Flomax

## 2021-05-24 NOTE — Assessment & Plan Note (Addendum)
-   s/p recent PCI to RCA in Jan 2022 - continue statin, plavix, eliquis

## 2021-05-24 NOTE — Evaluation (Signed)
Occupational Therapy Evaluation Patient Details Name: [name] MRN: [id number] DOB: [birth year] Today's Date: 05/24/2021    History of Present Illness This 75 y.o. male presented to hospital with fever.  Dx: sepsis due to UTI.  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 All education has been completed and the patient has no further questions. Pt is able to complete all ADLs independently (simulated).  He does demonstrate DOE 3/4, but Sp02 99% on 2L and HR in 80s.  He lives with is wife and was independent PTA.  Recommend he sit to shower to conserve energy.  See below for any follow-up Occupational Therapy or equipment needs. OT is signing off. Thank you for this referral.     Follow Up Recommendations  No OT follow up;Supervision - Intermittent    Equipment Recommendations  None recommended by OT    Recommendations for Other Services       Precautions / Restrictions Precautions Precautions: None      Mobility Bed Mobility Overal bed mobility: Independent                  Transfers Overall transfer level: Independent                    Balance Overall balance assessment: No apparent balance deficits (not formally assessed)                                         ADL either performed or assessed with clinical judgement   ADL Overall ADL's : Independent                                       General ADL Comments: DOE 3/4 with activity.  Sp02 99% on 2L 02     Vision Baseline Vision/History:  (detached retina) Patient Visual Report: No change from baseline       Perception      Praxis      Pertinent Vitals/Pain Pain Assessment: No/denies pain     Hand Dominance Right   Extremity/Trunk Assessment Upper Extremity Assessment Upper Extremity Assessment: Overall WFL for tasks assessed   Lower Extremity Assessment Lower Extremity Assessment: Overall WFL for tasks assessed   Cervical / Trunk Assessment Cervical / Trunk Assessment: Normal   Communication Communication Communication: Prefers language other than English (Pt preferred daughter to interpret requesting I call her rather than use video interpreter)   Cognition Arousal/Alertness: Awake/alert Behavior During Therapy: WFL for tasks assessed/performed Overall Cognitive Status: Within Functional Limits for tasks assessed                                     General Comments       Exercises     Shoulder Instructions      Home Living Family/patient expects to be discharged to:: Private residence Living Arrangements: Spouse/significant other Available Help at Discharge: Family;Available 24 hours/day Type of Home: House Home Access: Stairs  to enter     Home Layout: Two level;Able to live on main level with bedroom/bathroom Alternate Level Stairs-Number of Steps: full flight   Bathroom Shower/Tub: Occupational psychologist: Standard     Home Equipment: Shower seat   Additional Comments: lives with wife      Prior Functioning/Environment Level of Independence: Independent        Comments: Pt no longer drives due to detached retina in 11/2020; he was walking a mile/day with his dog and wife a few days PTA.        OT Problem List: Decreased activity tolerance      OT Treatment/Interventions:      OT Goals(Current goals can be found in the care plan section) Acute Rehab OT Goals Patient Stated Goal: pt did not state OT Goal Formulation: All assessment and education complete, DC therapy  OT Frequency:     Barriers to D/C:            Co-evaluation               AM-PAC OT "6 Clicks" Daily Activity     Outcome Measure Help from another person eating meals?: None Help from another person taking care of personal grooming?: None Help from another person toileting, which includes using toliet, bedpan, or urinal?: None Help from another person bathing (including washing, rinsing, drying)?: None Help from another person to put on and taking off regular upper body clothing?: None Help from another person to put on and taking off regular lower body clothing?: None 6 Click Score: 24   End of Session Equipment Utilized During Treatment: Oxygen Nurse Communication: Mobility status  Activity Tolerance: Patient tolerated treatment well Patient left: in bed;with call bell/phone within reach;with bed alarm set  OT Visit Diagnosis: Unsteadiness on feet (R26.81)                Time: 4503-8882 OT Time Calculation (min): 28 min Charges:  OT General Charges $OT Visit: 1 Visit OT Evaluation $OT Eval Low Complexity: 1 Low OT Treatments $Therapeutic Activity: 8-22 mins  Nilsa Nutting., OTR/L Acute Rehabilitation Services Pager [phone] Office [phone]   Lucille Passy M 05/24/2021, 1:22 PM

## 2021-05-24 NOTE — Assessment & Plan Note (Addendum)
-   fever, tachypnea, leukopenia, source considered urinary - s/p CT renal study (double-J nephroureteral stent in place without residual hydronephrosis) but does show extensive perinephric, periureteral and perivesicular stranding -Continue cefepime - Follow-up cultures (blood and urine cultures now positive for Pseudomonas). Urine cx also has E. Faecalis but likely not infectious

## 2021-05-24 NOTE — Assessment & Plan Note (Signed)
-   continue amio and Eliquis  - will need to hold Eliquis if any plans for surgical intervention

## 2021-05-25 ENCOUNTER — Other Ambulatory Visit: Payer: Self-pay

## 2021-05-25 DIAGNOSIS — N179 Acute kidney failure, unspecified: Secondary | ICD-10-CM

## 2021-05-25 DIAGNOSIS — N390 Urinary tract infection, site not specified: Secondary | ICD-10-CM

## 2021-05-25 DIAGNOSIS — A419 Sepsis, unspecified organism: Secondary | ICD-10-CM | POA: Diagnosis not present

## 2021-05-25 DIAGNOSIS — N1831 Chronic kidney disease, stage 3a: Secondary | ICD-10-CM

## 2021-05-25 DIAGNOSIS — R7881 Bacteremia: Secondary | ICD-10-CM | POA: Diagnosis not present

## 2021-05-25 DIAGNOSIS — D709 Neutropenia, unspecified: Secondary | ICD-10-CM | POA: Diagnosis present

## 2021-05-25 DIAGNOSIS — N1330 Unspecified hydronephrosis: Secondary | ICD-10-CM

## 2021-05-25 LAB — BASIC METABOLIC PANEL
Anion gap: 5 (ref 5–15)
BUN: 26 mg/dL — ABNORMAL HIGH (ref 8–23)
CO2: 25 mmol/L (ref 22–32)
Calcium: 8.1 mg/dL — ABNORMAL LOW (ref 8.9–10.3)
Chloride: 109 mmol/L (ref 98–111)
Creatinine, Ser: 1.63 mg/dL — ABNORMAL HIGH (ref 0.61–1.24)
GFR, Estimated: 44 mL/min — ABNORMAL LOW (ref >60)
Glucose, Bld: 167 mg/dL — ABNORMAL HIGH (ref 70–99)
Potassium: 3.6 mmol/L (ref 3.5–5.1)
Sodium: 139 mmol/L (ref 135–145)

## 2021-05-25 LAB — CBC WITH DIFFERENTIAL/PLATELET
Abs Immature Granulocytes: 0.01 10*3/uL (ref 0.00–0.07)
Basophils Absolute: 0 10*3/uL (ref 0.0–0.1)
Basophils Relative: 1 %
Eosinophils Absolute: 0.1 10*3/uL (ref 0.0–0.5)
Eosinophils Relative: 4 %
HCT: 27.2 % — ABNORMAL LOW (ref 39.0–52.0)
Hemoglobin: 8.7 g/dL — ABNORMAL LOW (ref 13.0–17.0)
Immature Granulocytes: 0 %
Lymphocytes Relative: 46 %
Lymphs Abs: 1.3 10*3/uL (ref 0.7–4.0)
MCH: 26.3 pg (ref 26.0–34.0)
MCHC: 32 g/dL (ref 30.0–36.0)
MCV: 82.2 fL (ref 80.0–100.0)
Monocytes Absolute: 1 10*3/uL (ref 0.1–1.0)
Monocytes Relative: 35 %
Neutro Abs: 0.4 10*3/uL — CL (ref 1.7–7.7)
Neutrophils Relative %: 14 %
Platelets: 130 10*3/uL — ABNORMAL LOW (ref 150–400)
RBC: 3.31 MIL/uL — ABNORMAL LOW (ref 4.22–5.81)
RDW: 16.9 % — ABNORMAL HIGH (ref 11.5–15.5)
WBC: 2.8 10*3/uL — ABNORMAL LOW (ref 4.0–10.5)
nRBC: 0 % (ref 0.0–0.2)

## 2021-05-25 LAB — GLUCOSE, CAPILLARY
Glucose-Capillary: 128 mg/dL — ABNORMAL HIGH (ref 70–99)
Glucose-Capillary: 154 mg/dL — ABNORMAL HIGH (ref 70–99)
Glucose-Capillary: 192 mg/dL — ABNORMAL HIGH (ref 70–99)
Glucose-Capillary: 193 mg/dL — ABNORMAL HIGH (ref 70–99)
Glucose-Capillary: 228 mg/dL — ABNORMAL HIGH (ref 70–99)
Glucose-Capillary: 257 mg/dL — ABNORMAL HIGH (ref 70–99)

## 2021-05-25 LAB — MAGNESIUM: Magnesium: 2 mg/dL (ref 1.7–2.4)

## 2021-05-25 NOTE — Progress Notes (Signed)
[name]   DOB:[birth year]   PY#:[id number]    ASSESSMENT & PLAN:  Diffuse large B-cell lymphoma, ABC type --known history of DLBCL, currently planning to start chemotherapy after PET, Port, and TTE.  --Echocardiogram performed in house. Can consider port placement as well if blood cultures are negative. PET CT scan scheduled for 06/03/2021 in outpatient setting. Cannot be done in house.  --Unfortunately, clinically, the lymphadenopathy is causing compressive/obstructive uropathy predispose him to recurrent UTI and sepsis --Since admission, he has received aggressive IV fluid resuscitation and IV antibiotics and felt overall improved --Continue supportive care --Oncology will continue to monitor.   Possible early sepsis secondary to UTI Cultures of urine show Pseudomonas aeruginosa >100,00 CFU. Susceptibilitiespending.  Blood cultures negative thus far.  He felt better with broad-spectrum IV antibiotics and IV fluid resuscitation Continue the same  Dehydration, acute on chronic renal failure Continue IV fluids per primary team.   Mild pancytopenia, component of anemia of chronic illness Observe closely for now  Code Status Full code  Goals of care Resolution of clinical sepsis  Discharge planning --d/c once infection is controlled and patient is asymptomatic  All questions were answered. The patient knows to call the clinic with any problems, questions or concerns.   The total time spent in the appointment was 25 minutes encounter with patients including review of chart and various tests results, discussions about plan of care and coordination of care plan  Orson Slick, MD 05/25/2021 2:20 PM  Subjective: His daughter, [name] served as an Astronomer over the phone.  -- [name] feels improved overnight. --He is eating well and has been afebrile since starting on antibiotics --No questions concerns or complaints today.  Objective:  Vitals:   05/25/21 0846 05/25/21  1339  BP: 138/66 131/73  Pulse: 73 67  Resp:  (!) 21  Temp:  97.7 F (36.5 C)  SpO2:  98%     Intake/Output Summary (Last 24 hours) at 05/25/2021 1420 Last data filed at 05/25/2021 1212 Gross per 24 hour  Intake 1401.25 ml  Output 1300 ml  Net 101.25 ml    GENERAL: Well-appearing [place] male, alert, no distress and comfortable. SKIN: skin color, texture, turgor are normal, no rashes or significant lesions EYES: normal, Conjunctiva are pink and non-injected, sclera clear LUNGS: clear to auscultation and percussion with normal breathing effort HEART: regular rate & rhythm and no murmurs with mild bilateral lower extremity edema Musculoskeletal:no cyanosis of digits and no clubbing  NEURO: alert & oriented x 3 with fluent speech, no focal motor/sensory deficits   Labs:  Recent Labs    05/13/21 1014 05/23/21 1540 05/24/21 0324 05/25/21 0338  NA 140 135 140 139  K 4.8 3.9 3.9 3.6  CL 107 103 107 109  CO2 23 24 26 25   GLUCOSE 370* 190* 196* 167*  BUN 25* 21 23 26*  CREATININE 1.77* 1.73* 1.69* 1.63*  CALCIUM 8.6* 8.7* 8.6* 8.1*  GFRNONAA 40* 41* 42* 44*  PROT 5.8* 6.1* 5.6*  --   ALBUMIN 2.9* 3.6 3.0*  --   AST 31 16 13*  --   ALT 58* 23 19  --   ALKPHOS 82 94 76  --   BILITOT 0.4 1.4* 1.3*  --     Studies: I personally reviewed his CT imaging CT Abdomen Pelvis Wo Contrast  Result Date: 04/30/2021 CLINICAL DATA:  Right lower quadrant abdominal pain EXAM: CT ABDOMEN AND PELVIS WITHOUT CONTRAST TECHNIQUE: Multidetector CT imaging of the abdomen  and pelvis was performed following the standard protocol without IV contrast. COMPARISON:  None. FINDINGS: Lower chest: 6 mm mean diameter noncalcified pulmonary nodule is seen within the visualized right middle lobe, indeterminate. Scattered ground-glass pulmonary infiltrates in the noted at the lung bases bilaterally with subtle, smooth interlobular septal thickening possibly representing changes of trace pulmonary edema,  possibly cardiogenic in nature. Extensive multi-vessel coronary artery calcification is present with probable stenting of the distal right coronary artery. Global cardiac size within normal limits. No pericardial effusion. Hypoattenuation of the cardiac blood pool is in keeping with at least mild anemia. Hepatobiliary: No focal liver abnormality is seen. No gallstones, gallbladder wall thickening, or biliary dilatation. Pancreas: Unremarkable Spleen: There is moderate splenomegaly with the spleen measuring 17.2 cm in greatest dimension. No definite intrasplenic lesion identified on this noncontrast examination. Adrenals/Urinary Tract: The adrenal glands are unremarkable. The kidneys are normal in size and position. There is mild right hydronephrosis secondary to an obstructing lobulated mass in the region of the right ureteropelvic junction measuring 3.1 x 4.5 x 3.8 cm on axial image # 58 and coronal image # 67 while difficult to accurately characterize in the absence of contrast administration, the mass appears external to the right ureter and demonstrates occlusion of the a proximal right ureter likely related to extrinsic mass effect, best appreciated on coronal imaging. Mild bilateral nonspecific perinephric stranding. No intrarenal or ureteral calculi are identified no hydronephrosis on the left. Appearing calcifications along the posterior wall of the bladder in the region of the ureteropelvic junction may represent posterior layering calculi within the bladder lumen or may be artifactual and related to streak artifact from right total hip arthroplasty. Stomach/Bowel: The stomach, small bowel, and large bowel are unremarkable. No evidence of obstruction or focal inflammation. The appendix is normal. No free intraperitoneal gas or fluid. Vascular/Lymphatic: There is extensive mesenteric infiltration. Additionally, there is pathologic mesenteric adenopathy with the dominant mass measuring 6.7 x 4.1 cm at axial  image # 44/2. Multiple additional pathologically enlarged mesenteric lymph nodes are identified demonstrating irregular margins. There is, additionally, pathologic bilateral common iliac and external iliac lymphadenopathy as well as right pelvic sidewall lymphadenopathy with the index right common iliac lymph node measuring 1.8 x 3.7 cm at axial image # 72/2. Borderline pathologic aortocaval and left periaortic lymphadenopathy is present. There is moderate aortoiliac atherosclerotic calcification. No aortic aneurysm. Reproductive: Mild prostatic enlargement. Seminal vesicles are unremarkable. Other: Soft tissue nodule within the right retroperitoneum adjacent to the right psoas muscle may represent pathologic retroperitoneal adenopathy or a metastatic implant. This measures 1.7 x 3.0 cm at axial image # 63/2. 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 Degenerative changes are seen within the lumbar spine. No lytic or blastic bone lesions are identified. IMPRESSION: Pathologic mesenteric, retroperitoneal, and pelvic adenopathy, as described above. Moderate splenomegaly. Lobulated retroperitoneal soft tissue masses in the region of the right ureteropelvic junction likely resulting in mild right hydronephrosis secondary to extrinsic mass effect as well as within the right retroperitoneum. 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 Less likely, this may represent metastatic disease secondary to a primary urothelial malignancy or potentially metastatic disease related to underlying prostate cancer, however, the nodal distribution would be unusual for the former and the lack of osseous metastatic disease given the extensive nodal involvement would be unusual for the latter. PET CT examination may be more helpful for further evaluation as well as identification of a a optimal tissue sampling target. Pathologic right external  iliac lymphadenopathy may be amenable to ultrasound-guided  sampling for definitive diagnosis. 6 mm of right middle lobe pulmonary nodule. Non-contrast chest CT at 6-12 months is recommended. If the nodule is stable at time of repeat CT, then future CT at 18-24 months (from today's scan) is considered optional for low-risk patients, but is recommended for high-risk patients. This recommendation follows the consensus statement: Guidelines for Management of Incidental Pulmonary Nodules Detected on CT Images: From the Fleischner Society 2017; Radiology 2017; 284:228-243. Extensive coronary artery calcification. Scattered ground-glass pulmonary infiltrates and subtle smooth interlobular septal thickening may reflect changes of mild cardiogenic failure. At least mild anemia. Aortic Atherosclerosis (ICD10-I70.0). Electronically Signed   By: Fidela Salisbury MD   On: 04/30/2021 14:38   US RENAL  Result Date: 05/06/2021 CLINICAL DATA:  Acute on chronic kidney disease EXAM: RENAL / URINARY TRACT ULTRASOUND COMPLETE COMPARISON:  CT 04/30/2021 FINDINGS: Right Kidney: Renal measurements: 11.8 x 7.5 x 5.6 cm = volume: 259.6 mL. Echogenicity within normal limits. Mild right hydronephrosis which is likely improved as compared with recent CT. 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 Left Kidney: Renal measurements: 12.3 x 6 x 5.4 cm = volume: 7.7 mL. Small cyst at the midpole measuring 12 x 14 x 11 mm additional small cyst at the midpole measuring 12 x 9 x 15 mm. Bladder: Appears normal for degree of bladder distention. Other: None. IMPRESSION: 1. Mild right hydronephrosis probably improved as compared with prior CT. Right UPJ region mass on CT is not well demonstrated on sonogram today. 2. Small left renal cysts.  No left hydronephrosis Electronically Signed   By: Donavan Foil M.D.   On: 05/06/2021 21:28   DG Chest Port 1 View  Result Date: 05/23/2021 CLINICAL DATA:  Fever EXAM: PORTABLE CHEST 1 VIEW COMPARISON:  05/06/2021 FINDINGS: Low volume AP portable  examination with mild, diffuse bilateral interstitial opacity and heterogeneous opacity in the retrocardiac left lung base. Cardiomegaly. The visualized skeletal structures are unremarkable. IMPRESSION: 1. Low volume AP portable examination with mild, diffuse bilateral interstitial opacity and heterogeneous opacity in the retrocardiac left lung base, which may reflect atelectasis in the setting of cardiomegaly or alternately infection/aspiration. 2. Cardiomegaly. Electronically Signed   By: Eddie Candle M.D.   On: 05/23/2021 15:49   DG Chest Port 1 View  Result Date: 05/06/2021 CLINICAL DATA:  75 year old male with concern for sepsis. EXAM: PORTABLE CHEST 1 VIEW COMPARISON:  None. FINDINGS: No focal consolidation, pleural effusion, or pneumothorax. Borderline cardiomegaly. Atherosclerotic calcification of the aorta. Bilateral hilar prominence, likely dilatation of the main pulmonary arteries and suggestive of pulmonary hypertension. Adenopathy is less likely but not excluded. Attention on follow-up imaging recommended. IMPRESSION: 1. No acute cardiopulmonary process. 2. Borderline cardiomegaly with findings suggestive of pulmonary hypertension. Electronically Signed   By: Anner Crete M.D.   On: 05/06/2021 18:27   DG C-Arm 1-60 Min-No Report  Result Date: 05/01/2021 Fluoroscopy was utilized by the requesting physician.  No radiographic interpretation.   ECHOCARDIOGRAM COMPLETE  Result Date: 05/24/2021    ECHOCARDIOGRAM REPORT   Patient Name:   [name] Date of Exam: 05/24/2021 Medical Rec #:  [id number]        Height:       70.0 in Accession #:    [id number]       Weight:       252.3 lb Date of Birth:  1946/07/27         BSA:  2.304 m Patient Age:    43 years         BP:           149/73 mmHg Patient Gender: M                HR:           62 bpm. Exam Location:  Inpatient Procedure: 2D Echo, Cardiac Doppler and Color Doppler Indications:    Cardiomegaly  History:        Patient has no  prior history of Echocardiogram examinations,                 most recent 05/13/2021. Angina and CAD, Arrythmias:Atrial                 Fibrillation; Risk Factors:Diabetes, Dyslipidemia and                 Hypertension.  Sonographer:    Luisa Hart RDCS Referring Phys: [place]  Sonographer Comments: Patient is morbidly obese. Image acquisition challenging due to patient body habitus and Image acquisition challenging due to respiratory motion. IMPRESSIONS  1. No obvious regional wall motion abnormaliies are seen, but the apical views are difficult to evaluate. Left ventricular ejection fraction, by estimation, is 45 to 50%. The left ventricle has mildly decreased function. Left ventricular endocardial border not optimally defined to evaluate regional wall motion. The left ventricular internal cavity size was mildly dilated. Left ventricular diastolic parameters are consistent with Grade II diastolic dysfunction (pseudonormalization). Elevated left atrial pressure.  2. Right ventricular systolic function is normal. The right ventricular size is mildly enlarged. There is mildly elevated pulmonary artery systolic pressure. The estimated right ventricular systolic pressure is 52.8 mmHg.  3. Left atrial size was moderately dilated.  4. Right atrial size was mildly dilated.  5. The mitral valve is normal in structure. No evidence of mitral valve regurgitation.  6. The aortic valve is tricuspid. There is mild calcification of the aortic valve. There is mild thickening of the aortic valve. Aortic valve regurgitation is not visualized. Mild to moderate aortic valve sclerosis/calcification is present, without any evidence of aortic stenosis.  7. Aortic dilatation noted. There is mild dilatation of the aortic root, measuring 40 mm. FINDINGS  Left Ventricle: No obvious regional wall motion abnormaliies are seen, but the apical views are difficult to evaluate. Left ventricular ejection fraction, by estimation, is  45 to 50%. The left ventricle has mildly decreased function. Left ventricular endocardial border not optimally defined to evaluate regional wall motion. The left ventricular internal cavity size was mildly dilated. There is no left ventricular hypertrophy. Left ventricular diastolic parameters are consistent with Grade II diastolic dysfunction (pseudonormalization). Elevated left atrial pressure. Right Ventricle: The right ventricular size is mildly enlarged. No increase in right ventricular wall thickness. Right ventricular systolic function is normal. There is mildly elevated pulmonary artery systolic pressure. The tricuspid regurgitant velocity is 2.89 m/s, and with an assumed right atrial pressure of 3 mmHg, the estimated right ventricular systolic pressure is 41.3 mmHg. Left Atrium: Left atrial size was moderately dilated. Right Atrium: Right atrial size was mildly dilated. Pericardium: There is no evidence of pericardial effusion. Mitral Valve: The mitral valve is normal in structure. Mild mitral annular calcification. No evidence of mitral valve regurgitation. Tricuspid Valve: The tricuspid valve is not well visualized. Tricuspid valve regurgitation is trivial. Aortic Valve: The aortic valve is tricuspid. There is mild calcification of the aortic valve. There is mild  thickening of the aortic valve. Aortic valve regurgitation is not visualized. Mild to moderate aortic valve sclerosis/calcification is present, without any evidence of aortic stenosis. Aortic valve mean gradient measures 9.0 mmHg. Aortic valve peak gradient measures 16.2 mmHg. Aortic valve area, by VTI measures 2.84 cm. Pulmonic Valve: The pulmonic valve was grossly normal. Pulmonic valve regurgitation is not visualized. Aorta: Aortic dilatation noted. There is mild dilatation of the aortic root, measuring 40 mm. IAS/Shunts: No atrial level shunt detected by color flow Doppler.  LEFT VENTRICLE PLAX 2D LVIDd:         5.87 cm      Diastology  LVIDs:         4.59 cm      LV e' medial:    5.87 cm/s LV PW:         0.80 cm      LV E/e' medial:  18.1 LV IVS:        0.80 cm      LV e' lateral:   7.40 cm/s LVOT diam:     2.30 cm      LV E/e' lateral: 14.3 LV SV:         108 LV SV Index:   47 LVOT Area:     4.15 cm  LV Volumes (MOD) LV vol d, MOD A2C: 62.7 ml LV vol d, MOD A4C: 116.0 ml LV vol s, MOD A2C: 28.6 ml LV vol s, MOD A4C: 71.8 ml LV SV MOD A2C:     34.1 ml LV SV MOD A4C:     116.0 ml LV SV MOD BP:      44.3 ml RIGHT VENTRICLE RV Basal diam:  4.35 cm RV Mid diam:    2.80 cm RV S prime:     17.40 cm/s TAPSE (M-mode): 2.7 cm LEFT ATRIUM             Index       RIGHT ATRIUM           Index LA diam:        4.50 cm 1.95 cm/m  RA Area:     20.70 cm LA Vol (A2C):   37.4 ml 16.23 ml/m RA Volume:   62.00 ml  26.91 ml/m LA Vol (A4C):   57.0 ml 24.74 ml/m LA Biplane Vol: 46.8 ml 20.31 ml/m  AORTIC VALVE                    PULMONIC VALVE AV Area (Vmax):    3.06 cm     PV Vmax:       1.08 m/s AV Area (Vmean):   3.09 cm     PV Vmean:      78.400 cm/s AV Area (VTI):     2.84 cm     PV VTI:        0.234 m AV Vmax:           201.00 cm/s  PV Peak grad:  4.7 mmHg AV Vmean:          137.000 cm/s PV Mean grad:  3.0 mmHg AV VTI:            0.379 m AV Peak Grad:      16.2 mmHg AV Mean Grad:      9.0 mmHg LVOT Vmax:         148.00 cm/s LVOT Vmean:        102.000 cm/s LVOT VTI:  0.259 m LVOT/AV VTI ratio: 0.68  AORTA Ao Root diam: 4.00 cm Ao Asc diam:  3.50 cm MITRAL VALVE                TRICUSPID VALVE MV Area (PHT): 3.74 cm     TR Peak grad:   33.4 mmHg MV Decel Time: 203 msec     TR Vmax:        289.00 cm/s MV E velocity: 106.00 cm/s MV A velocity: 45.40 cm/s   SHUNTS MV E/A ratio:  2.33         Systemic VTI:  0.26 m                             Systemic Diam: 2.30 cm Sanda Klein MD Electronically signed by Sanda Klein MD Signature Date/Time: 05/24/2021/2:39:41 PM    Final    CT Renal Stone Study  Result Date: 05/23/2021 CLINICAL DATA:   Hydronephrosis, weakness, recently completed antibiotics. Persistent fever EXAM: CT ABDOMEN AND PELVIS WITHOUT CONTRAST TECHNIQUE: Multidetector CT imaging of the abdomen and pelvis was performed following the standard protocol without IV contrast. COMPARISON:  CT 04/30/2021, CT 05/04/2021, ultrasound 05/06/2021 FINDINGS: Lower chest: Small bilateral effusions, new from prior. Some additional more consolidative opacity the left lung base, could be atelectatic or airspace disease. Diffuse interlobular septal thickening and fissural thickening. Cardiac size within normal limits. Coronary artery atherosclerosis. No pericardial effusion. Hepatobiliary: No worrisome focal liver lesions. Smooth liver surface contour. Normal hepatic attenuation. Normal gallbladder and biliary tree. Pancreas: Partial fatty replacement of the pancreas. No pancreatic ductal dilatation or surrounding inflammatory changes. Spleen: Normal in size. No concerning splenic lesions. Adrenals/Urinary Tract: Normal adrenal glands. Bilateral perinephric stranding, similar to slightly increased from comparison prior. A right double-J nephroureteral stent is in place at the time of exam. Proximal and appropriately terminating in the renal pelvis. Distal and terminating within the urinary bladder. No residual right hydronephrosis. Redemonstration of a heterogeneous mass at the right ureteropelvic junction which appears external to the ureter and kidney, measuring approximately 5.4 x 3.5 cm in size, previously 4.5 x 3 cm in size. Suspect several left parapelvic cysts. No concerning left pelvic lesion. Few small left renal cyst corresponding well to those seen on comparison ultrasound and CT imaging. Urinary bladder is largely decompressed by inflated Foley catheter. Small amount of joint luminal gas is nonspecific given instrumentation. Some degree of bladder wall thickening is expected given underdistention though faint perivesicular hazy stranding is  present. Portions of the bladder obscured by streak artifact from a right hip arthroplasty. Stomach/Bowel: Distal esophagus, stomach and duodenal sweep are unremarkable. No small bowel wall thickening or dilatation. No evidence of obstruction. A normal appendix is visualized. No colonic dilatation or wall thickening. Scattered colonic diverticula without focal inflammation to suggest diverticulitis. Vascular/Lymphatic: Extensive mesenteric infiltration. Pathologic mesenteric adenopathy throughout the mid mesentery with a larger dominant mass measuring 5.6 by 3.6 cm in size which appears to decreased in the interim having previously measured 6.7 x 4.1 cm at similar level. There is persistent pathologic adenopathy throughout the abdomen and pelvis most pronounced in the periaortic chains and central mesentery. Index right common iliac node measures 1.8 cm in short axis (2/63), not significantly changed from prior. Additional index right pelvic sidewall node measuring 1 cm short axis is unchanged from prior as well (2/75). Atherosclerotic plaque throughout the abdominal aorta and branch vessels. No aneurysm or ectasia. Reproductive: Mild prostatomegaly. Seminal vesicles  are unremarkable. Other: Mesenteric infiltration. Perinephric retroperitoneal stranding and trace free fluid in the pararenal spaces. No bowel containing hernia. No free air. Musculoskeletal: The osseous structures appear diffusely demineralized which may limit detection of small or nondisplaced fractures. No acute osseous abnormality or suspicious osseous lesion. Prior right hip arthroplasty in expected alignment. No acute or suspicious osseous lesions. IMPRESSION: 1. 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 A lobulated retroperitoneal soft tissue mass towards the right ureteropelvic junction with resulting compressive mass effect upon the collecting system has slightly  increased in size from prior now measuring 5.4 x 3.5 cm. A double-J nephroureteral stent remains in place without significant residual hydronephrosis at this time. 2. A separate large deposit in the upper mesentery as seemingly decreased in the interim while other index lymph nodes remain stable from prior. 3. Extensive perinephric, periureteral and perivesicular stranding, concerning for an ascending tract infection in the setting of sepsis. 4. Patchy area of opacity in posterior left lung base, possibly atelectatic or airspace disease with additional features of developing pulmonary edema and bilateral pleural effusions. 5. Prostatomegaly. 6. Prior right hip arthroplasty. 7. Aortic Atherosclerosis (ICD10-I70.0). 8. Coronary atherosclerosis. These results were called by telephone at the time of interpretation on 05/23/2021 at 9:46 pm to provider Doutova MD, who verbally acknowledged these results. Electronically Signed   By: Lovena Le M.D.   On: 05/23/2021 21:46   CT RENAL BIOPSY  Result Date: 05/04/2021 INDICATION: 75 year old male with right infrarenal retroperitoneal mass and abdominal lymphadenopathy of indeterminate etiology. EXAM: CT BIOPSY CORE RENAL COMPARISON:  05/30/2021 MEDICATIONS: None. ANESTHESIA/SEDATION: Fentanyl 100 mcg IV; Versed 2 mg IV Sedation time: 13 minutes; The patient was continuously monitored during the procedure by the interventional radiology nurse under my direct supervision. CONTRAST:  None. COMPLICATIONS: None immediate. PROCEDURE: Informed consent was obtained from the patient following an explanation of the procedure, risks, benefits and alternatives. A time out was performed prior to the initiation of the procedure. The patient was positioned prone on the CT table and a limited CT was performed for procedural planning demonstrating similar appearing soft tissue mass about the inferior aspect of the right kidney which appears to surround the proximal right ureter. The  procedure was planned. The operative site was prepped and draped in the usual sterile fashion. Appropriate trajectory was confirmed with a 22 gauge spinal needle after the adjacent tissues were anesthetized with 1% Lidocaine with epinephrine. Under intermittent CT guidance, a 17 gauge coaxial needle was advanced into the peripheral aspect of the mass. Appropriate positioning was confirmed and total of 3 samples were obtained with an 18 gauge core needle biopsy device. The co-axial needle was removed while injecting Gel-Foam slurry and hemostasis was achieved with manual compression. A limited postprocedural CT was negative for hemorrhage or additional complication. A dressing was placed. The patient tolerated the procedure well without immediate postprocedural complication. IMPRESSION: Technically successful CT guided core needle biopsy of right infrarenal retroperitoneal mass. Ruthann Cancer, MD [facility] Electronically Signed   By: Ruthann Cancer MD   On: 05/04/2021 14:23

## 2021-05-25 NOTE — Evaluation (Signed)
Physical Therapy Evaluation Patient Details Name: [name] MRN: [id number] DOB: 1946/04/04 Today's Date: 05/25/2021   History of Present Illness  This 75 y.o. male presented to hospital with fever.  Dx: sepsis due to UTI.  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 He walked ~150 feet while managing the IV pole. O2 94% on RA during ambulation. Pt tolerated activity well. Mildly unsteady but no LOB. Will continue to follow. Recommend daily ambulation in hallway with nursing/family supervision, in addition to PT services.     Follow Up Recommendations Supervision for mobility/OOB    Equipment Recommendations  None recommended by PT    Recommendations for Other Services       Precautions / Restrictions Precautions Precautions: None Restrictions Weight Bearing Restrictions: No      Mobility  Bed Mobility Overal bed mobility: Independent                  Transfers Overall transfer level: Modified independent                  Ambulation/Gait Ambulation/Gait assistance: Supervision Gait Distance (Feet): 150 Feet Assistive device: IV Pole Gait Pattern/deviations: Step-through pattern;Decreased stride length     General Gait Details: Supv for safety. Mildly unsteady intermittently-no LOB. O2 94% on RA  Stairs            Wheelchair Mobility    Modified Rankin (Stroke Patients Only)       Balance Overall balance assessment: Mild deficits observed, not formally tested                                           Pertinent Vitals/Pain Pain Assessment: No/denies pain    Home Living Family/patient expects to be discharged to:: Private  residence Living Arrangements: Spouse/significant other Available Help at Discharge: Family;Available 24 hours/day Type of Home: House Home Access: Stairs to enter     Home Layout: Two level;Able to live on main level with bedroom/bathroom Home Equipment: Shower seat      Prior Function Level of Independence: Independent         Comments: Pt no longer drives due to detached retina in 11/2020; he was walking a mile/day with his dog and wife a few days PTA.     Hand Dominance        Extremity/Trunk Assessment   Upper Extremity Assessment Upper Extremity Assessment: Defer to OT evaluation    Lower Extremity Assessment Lower Extremity Assessment: Generalized weakness    Cervical / Trunk Assessment Cervical / Trunk Assessment: Normal  Communication   Communication: Prefers language other than English (pt preferred son translate instaed of video interpreter)  Cognition Arousal/Alertness: Awake/alert Behavior During Therapy: WFL for tasks assessed/performed Overall Cognitive Status: Within Functional Limits for tasks assessed                                        General Comments      Exercises     Assessment/Plan    PT Assessment Patient needs continued PT services  PT  Problem List Decreased mobility       PT Treatment Interventions Gait training;Therapeutic exercise;Balance training;Therapeutic activities;Patient/family education;Functional mobility training    PT Goals (Current goals can be found in the Care Plan section)  Acute Rehab PT Goals Patient Stated Goal: home PT Goal Formulation: With patient Time For Goal Achievement: 06/08/21 Potential to Achieve Goals: Good    Frequency Min 3X/week   Barriers to discharge        Co-evaluation               AM-PAC PT "6 Clicks" Mobility  Outcome Measure Help needed turning from your back to your side while in a flat bed without using bedrails?: None Help needed moving from  lying on your back to sitting on the side of a flat bed without using bedrails?: None Help needed moving to and from a bed to a chair (including a wheelchair)?: None Help needed standing up from a chair using your arms (e.g., wheelchair or bedside chair)?: None Help needed to walk in hospital room?: None Help needed climbing 3-5 steps with a railing? : A Little 6 Click Score: 23    End of Session   Activity Tolerance: Patient tolerated treatment well Patient left: in chair;with call bell/phone within reach   PT Visit Diagnosis: Unsteadiness on feet (R26.81)    Time: 2637-8588 PT Time Calculation (min) (ACUTE ONLY): 18 min   Charges:   PT Evaluation $PT Eval Moderate Complexity: 1 Mod          Doreatha Massed, PT Acute Rehabilitation  Office: [phone] Pager: [phone]

## 2021-05-25 NOTE — Progress Notes (Signed)
Inpatient Diabetes Program Recommendations  AACE/ADA: New Consensus Statement on Inpatient Glycemic Control (2015)  Target Ranges:  Prepandial:   less than 140 mg/dL      Peak postprandial:   less than 180 mg/dL (1-2 hours)      Critically ill patients:  140 - 180 mg/dL   Lab Results  Component Value Date   GLUCAP 193 (H) 05/25/2021   HGBA1C 8.1 (H) 05/23/2021    Review of Glycemic Control Results for LINSEY, HIROTA (MRN [id number]) as of 05/25/2021 12:57  Ref. Range 05/25/2021 04:09 05/25/2021 07:23 05/25/2021 12:00  Glucose-Capillary Latest Ref Range: 70 - 99 mg/dL 154 (H) 128 (H) 193 (H)   Diabetes history: DM 2 Outpatient Diabetes medications:  Glucotrol 10 mg bid, Lantus 34 units q HS, Metformin 1000 mg bid Current orders for Inpatient glycemic control:  Lantus 25 units q HS, Novolog sensitive q 4 hours Inpatient Diabetes Program Recommendations:   Consider changing Novolog correction to tid with meals and HS scale.  Also consider adding Novolog 3 units tid with meals (hold if patient eats less than 50% or NPO).    Thanks,  Adah Perl, RN, BC-ADM Inpatient Diabetes Coordinator Pager [phone] (8a-5p)

## 2021-05-25 NOTE — Progress Notes (Signed)
Used translator to discuss CPAP with Pt.  Pt states he will have the nurse contact RT if he decides to use CPAP while sleeping tonight.  RN aware.

## 2021-05-25 NOTE — Assessment & Plan Note (Addendum)
-   See sepsis as well - 1/4 bottles positive from 05/23/21 - Urine culture growing Pseudomonas which he grew last time.  Likely has translocated to the blood due to insufficient treatment from last infection given his immunosuppressed state in setting of malignancy - Some concern would be for stent being infected as well.  No other objects in body of concern for biofilm at this time (Port-A-Cath has not been inserted yet and will be on hold for now) -ID consulted given complexity and for recommendations of duration of treatment - repeat blood cultures 7/11 and monitor for clearance  - Continue cefepime for now - TEE recommended per ID; TEE planned for 05/29/21

## 2021-05-25 NOTE — Consult Note (Signed)
[name] for Infectious Disease  Total days of antibiotics 3/cefepime          Reason for Consult: pseudomonal bacteremia   Referring Physician: girguis  Active Problems:   Hydronephrosis of right kidney   CAD (coronary artery disease)   DM2 (diabetes mellitus, type 2) (HCC)   PAF (paroxysmal atrial fibrillation) (HCC)   Complicated UTI (urinary tract infection)   Sleep apnea   Hyperlipidemia associated with type 2 diabetes mellitus (HCC)   Hypomagnesemia   Acute renal failure superimposed on stage 3a chronic kidney disease (HCC)   Prolonged QT interval   DLBCL (diffuse large B cell lymphoma) (HCC)   Normocytic anemia   BPH (benign prostatic hyperplasia)   Bacteremia due to Gram-negative bacteria   Severe neutropenia (HCC)    HPI: [name] is a 75 y.o. [place] male with CAD, DM with diabetic nephropathy with hx of  diffuse large B cell lymphoma, ABC type. He was recently admitted for aki due to right hydronephrosis from obstructive mass requiring ureteral stent placement and foley catheter. He was treated for 10 days of cipro to take through July 1st. He is now readmitted for foul smelling urine, urinary frequency, weakness, fevers, and rigors and found to have recurrence of uti and bacteremia. He was started on cefepime and is no longer febrile. ID asked to weigh in on management. Patient is undergoing work up for lymphoma treatment. Family is concerned that this will interfere with chemo treatment  - blood cx 7/9, urine cx 7/9 + - blood cx 6/22, urine cx 6/22 +   Past Medical History:  Diagnosis Date   Anginal pain ([place])    Coronary artery disease    Diabetes mellitus without complication ([place])    Dysrhythmia    Hypertension    Sleep apnea     Allergies: No Known Allergies  Current antibiotics:   MEDICATIONS:  amiodarone  200 mg Oral q morning   apixaban  5 mg Oral BID   atorvastatin  20 mg Oral QHS   Chlorhexidine Gluconate Cloth  6 each  Topical Daily   clopidogrel  75 mg Oral Daily   finasteride  5 mg Oral QHS   insulin aspart  0-9 Units Subcutaneous Q4H   insulin glargine  25 Units Subcutaneous QHS   metoprolol tartrate  50 mg Oral BID   tamsulosin  0.4 mg Oral QPC supper    Social History   Tobacco Use   Smoking status: Never   Smokeless tobacco: Never  Vaping Use   Vaping Use: Never used  Substance Use Topics   Alcohol use: Not Currently   Drug use: Never    Family History  Problem Relation Age of Onset   CAD Other     Review of Systems -  Constitutional: positive for fever, chills, diaphoresis, activity change, appetite change, fatigue and unexpected weight change.  HENT: Negative for congestion, sore throat, rhinorrhea, sneezing, trouble swallowing and sinus pressure.  Eyes: Negative for photophobia and visual disturbance.  Respiratory: Negative for cough, chest tightness, shortness of breath, wheezing and stridor.  Cardiovascular: Negative for chest pain, palpitations and leg swelling.  Gastrointestinal: Negative for nausea, vomiting, abdominal pain, diarrhea, constipation, blood in stool, abdominal distention and anal bleeding.  Genitourinary: Negative for dysuria, hematuria, flank pain and difficulty urinating.  Musculoskeletal: Negative for myalgias, back pain, joint swelling, arthralgias and gait problem.  Skin: Negative for color change, pallor, rash and wound.  Neurological: Negative for dizziness, tremors, weakness  and light-headedness.  Hematological: Negative for adenopathy. Does not bruise/bleed easily.  Psychiatric/Behavioral: Negative for behavioral problems, confusion, sleep disturbance, dysphoric mood, decreased concentration and agitation.      OBJECTIVE: Temp:  [97.7 F (36.5 C)-98.5 F (36.9 C)] 97.7 F (36.5 C) (07/11 1339) Pulse Rate:  [66-73] 67 (07/11 1339) Resp:  [20-21] 21 (07/11 1339) BP: (125-138)/(66-73) 131/73 (07/11 1339) SpO2:  [98 %-100 %] 98 % (07/11  1339) Physical Exam  Constitutional: He is oriented to person, place, and time. He appears well-developed and well-nourished. No distress.  HENT:  Mouth/Throat: Oropharynx is clear and moist. No oropharyngeal exudate.  Cardiovascular: Normal rate, regular rhythm and normal heart sounds. Exam reveals no gallop and no friction rub.  No murmur heard.  Pulmonary/Chest: Effort normal and breath sounds normal. No respiratory distress. He has no wheezes.  Abdominal: Soft. Bowel sounds are normal. He exhibits no distension. There is no tenderness.  Lymphadenopathy:  He has no cervical adenopathy.  Neurological: He is alert and oriented to person, place, and time.  Skin: Skin is warm and dry. No rash noted. No erythema.  Psychiatric: He has a normal mood and affect. His behavior is normal.    LABS: Results for orders placed or performed during the hospital encounter of 05/23/21 (from the past 48 hour(s))  CBG monitoring, ED     Status: Abnormal   Collection Time: 05/23/21  8:46 PM  Result Value Ref Range   Glucose-Capillary 166 (H) 70 - 99 mg/dL    Comment: Glucose reference range applies only to samples taken after fasting for at least 8 hours.  Glucose, capillary     Status: Abnormal   Collection Time: 05/23/21  9:58 PM  Result Value Ref Range   Glucose-Capillary 193 (H) 70 - 99 mg/dL    Comment: Glucose reference range applies only to samples taken after fasting for at least 8 hours.  Glucose, capillary     Status: Abnormal   Collection Time: 05/24/21 12:18 AM  Result Value Ref Range   Glucose-Capillary 192 (H) 70 - 99 mg/dL    Comment: Glucose reference range applies only to samples taken after fasting for at least 8 hours.  Vitamin B12     Status: None   Collection Time: 05/24/21  3:24 AM  Result Value Ref Range   Vitamin B-12 312 180 - 914 pg/mL    Comment: (NOTE) This assay is not validated for testing neonatal or myeloproliferative syndrome specimens for Vitamin B12  levels. Performed at [facility], [place] [address] [place], [place] [address]   Folate     Status: None   Collection Time: 05/24/21  3:24 AM  Result Value Ref Range   Folate 7.7 >5.9 ng/mL    Comment: Performed at [facility], [place] [address], [place], [place] [address]  Iron and TIBC     Status: Abnormal   Collection Time: 05/24/21  3:24 AM  Result Value Ref Range   Iron 13 (L) 45 - 182 ug/dL   TIBC 222 (L) 250 - 450 ug/dL   Saturation Ratios 6 (L) 17.9 - 39.5 %   UIBC 209 ug/dL    Comment: Performed at [facility] [place] [address] [place], [place] [address]  Ferritin     Status: None   Collection Time: 05/24/21  3:24 AM  Result Value Ref Range   Ferritin 205 24 - 336 ng/mL    Comment: Performed at [facility], [place]  [address] [place], [place] [address]  Reticulocytes     Status: Abnormal   Collection Time: 05/24/21  3:24 AM  Result Value Ref Range   Retic Ct Pct 3.4 (H) 0.4 - 3.1 %   RBC. 3.84 (L) 4.22 - 5.81 MIL/uL   Retic Count, Absolute 130.2 19.0 - 186.0 K/uL   Immature Retic Fract 16.9 (H) 2.3 - 15.9 %    Comment: Performed at [facility], [place] [address], [place], [place] [address]  Magnesium     Status: None   Collection Time: 05/24/21  3:24 AM  Result Value Ref Range   Magnesium 2.0 1.7 - 2.4 mg/dL    Comment: Performed at [facility], [place] [address] [place], [place] [address]  Phosphorus     Status: Abnormal   Collection Time: 05/24/21  3:24 AM  Result Value Ref Range   Phosphorus 5.0 (H) 2.5 - 4.6 mg/dL    Comment: Performed at [facility], [place] [address] [place], [place] [address]  CBC WITH DIFFERENTIAL     Status: Abnormal   Collection Time: 05/24/21  3:24 AM  Result Value Ref Range   WBC 3.7 (L) 4.0 - 10.5 K/uL   RBC 3.90 (L) 4.22 - 5.81 MIL/uL   Hemoglobin 10.1 (L) 13.0 - 17.0 g/dL   HCT 32.0 (L) 39.0 - 52.0 %   MCV  82.1 80.0 - 100.0 fL   MCH 25.9 (L) 26.0 - 34.0 pg   MCHC 31.6 30.0 - 36.0 g/dL   RDW 17.2 (H) 11.5 - 15.5 %   Platelets 154 150 - 400 K/uL   nRBC 0.0 0.0 - 0.2 %   Neutrophils Relative % 30 %   Neutro Abs 1.1 (L) 1.7 - 7.7 K/uL   Lymphocytes Relative 30 %   Lymphs Abs 1.1 0.7 - 4.0 K/uL   Monocytes Relative 38 %   Monocytes Absolute 1.4 (H) 0.1 - 1.0 K/uL   Eosinophils Relative 0 %   Eosinophils Absolute 0.0 0.0 - 0.5 K/uL   Basophils Relative 1 %   Basophils Absolute 0.0 0.0 - 0.1 K/uL   Immature Granulocytes 1 %   Abs Immature Granulocytes 0.03 0.00 - 0.07 K/uL    Comment: Performed at [facility], [place] [address], [place], [place] [address]  TSH     Status: None   Collection Time: 05/24/21  3:24 AM  Result Value Ref Range   TSH 1.664 0.350 - 4.500 uIU/mL    Comment: Performed by a 3rd Generation assay with a functional sensitivity of <=0.01 uIU/mL. Performed at [facility], [place] [address], [place],  [address]   Comprehensive metabolic panel     Status: Abnormal   Collection Time: 05/24/21  3:24 AM  Result Value Ref Range   Sodium 140 135 - 145 mmol/L   Potassium 3.9 3.5 - 5.1 mmol/L   Chloride 107 98 - 111 mmol/L   CO2 26 22 - 32 mmol/L   Glucose, Bld 196 (H) 70 - 99 mg/dL    Comment: Glucose reference range applies only to samples taken after fasting for at least 8 hours.   BUN 23 8 - 23 mg/dL   Creatinine, Ser 1.69 (H) 0.61 - 1.24 mg/dL   Calcium 8.6 (L) 8.9 - 10.3 mg/dL   Total Protein 5.6 (L) 6.5 - 8.1 g/dL   Albumin 3.0 (L) 3.5 - 5.0 g/dL   AST 13 (L) 15 - 41 U/L   ALT 19  0 - 44 U/L   Alkaline Phosphatase 76 38 - 126 U/L   Total Bilirubin 1.3 (H) 0.3 - 1.2 mg/dL   GFR, Estimated 42 (L) >60 mL/min    Comment: (NOTE) Calculated using the CKD-EPI Creatinine Equation (2021)    Anion gap 7 5 - 15    Comment: Performed at [facility], [place] [address], [place], [place] [address]  Glucose, capillary      Status: Abnormal   Collection Time: 05/24/21  4:00 AM  Result Value Ref Range   Glucose-Capillary 181 (H) 70 - 99 mg/dL    Comment: Glucose reference range applies only to samples taken after fasting for at least 8 hours.  Glucose, capillary     Status: Abnormal   Collection Time: 05/24/21  9:24 AM  Result Value Ref Range   Glucose-Capillary 211 (H) 70 - 99 mg/dL    Comment: Glucose reference range applies only to samples taken after fasting for at least 8 hours.  Glucose, capillary     Status: Abnormal   Collection Time: 05/24/21 12:22 PM  Result Value Ref Range   Glucose-Capillary 226 (H) 70 - 99 mg/dL    Comment: Glucose reference range applies only to samples taken after fasting for at least 8 hours.  Glucose, capillary     Status: Abnormal   Collection Time: 05/24/21  5:22 PM  Result Value Ref Range   Glucose-Capillary 175 (H) 70 - 99 mg/dL    Comment: Glucose reference range applies only to samples taken after fasting for at least 8 hours.  Glucose, capillary     Status: Abnormal   Collection Time: 05/24/21  7:43 PM  Result Value Ref Range   Glucose-Capillary 174 (H) 70 - 99 mg/dL    Comment: Glucose reference range applies only to samples taken after fasting for at least 8 hours.  Glucose, capillary     Status: Abnormal   Collection Time: 05/24/21 11:59 PM  Result Value Ref Range   Glucose-Capillary 257 (H) 70 - 99 mg/dL    Comment: Glucose reference range applies only to samples taken after fasting for at least 8 hours.  Basic metabolic panel     Status: Abnormal   Collection Time: 05/25/21  3:38 AM  Result Value Ref Range   Sodium 139 135 - 145 mmol/L   Potassium 3.6 3.5 - 5.1 mmol/L   Chloride 109 98 - 111 mmol/L   CO2 25 22 - 32 mmol/L   Glucose, Bld 167 (H) 70 - 99 mg/dL    Comment: Glucose reference range applies only to samples taken after fasting for at least 8 hours.   BUN 26 (H) 8 - 23 mg/dL   Creatinine, Ser 1.63 (H) 0.61 - 1.24 mg/dL   Calcium 8.1 (L) 8.9  - 10.3 mg/dL   GFR, Estimated 44 (L) >60 mL/min    Comment: (NOTE) Calculated using the CKD-EPI Creatinine Equation (2021)    Anion gap 5 5 - 15    Comment: Performed at [facility], [place] [address] [place], [place] [address]  CBC with Differential/Platelet     Status: Abnormal   Collection Time: 05/25/21  3:38 AM  Result Value Ref Range   WBC 2.8 (L) 4.0 - 10.5 K/uL   RBC 3.31 (L) 4.22 - 5.81 MIL/uL   Hemoglobin 8.7 (L) 13.0 - 17.0 g/dL   HCT 27.2 (L) 39.0 - 52.0 %   MCV 82.2 80.0 - 100.0 fL   MCH 26.3 26.0 -  34.0 pg   MCHC 32.0 30.0 - 36.0 g/dL   RDW 16.9 (H) 11.5 - 15.5 %   Platelets 130 (L) 150 - 400 K/uL   nRBC 0.0 0.0 - 0.2 %   Neutrophils Relative % 14 %   Neutro Abs 0.4 (LL) 1.7 - 7.7 K/uL    Comment: REPEATED TO VERIFY THIS CRITICAL RESULT HAS VERIFIED AND BEEN CALLED TO GARY,S. BY SEEL,MOLLY ON 07 11 2022 AT 0528, AND HAS BEEN READ BACK.     Lymphocytes Relative 46 %   Lymphs Abs 1.3 0.7 - 4.0 K/uL   Monocytes Relative 35 %   Monocytes Absolute 1.0 0.1 - 1.0 K/uL   Eosinophils Relative 4 %   Eosinophils Absolute 0.1 0.0 - 0.5 K/uL   Basophils Relative 1 %   Basophils Absolute 0.0 0.0 - 0.1 K/uL   Immature Granulocytes 0 %   Abs Immature Granulocytes 0.01 0.00 - 0.07 K/uL    Comment: Performed at [facility], [place] [address] [place], [place] [address]  Magnesium     Status: None   Collection Time: 05/25/21  3:38 AM  Result Value Ref Range   Magnesium 2.0 1.7 - 2.4 mg/dL    Comment: Performed at [facility], [place] [address], [place], [place] [address]  Glucose, capillary     Status: Abnormal   Collection Time: 05/25/21  4:09 AM  Result Value Ref Range   Glucose-Capillary 154 (H) 70 - 99 mg/dL    Comment: Glucose reference range applies only to samples taken after fasting for at least 8 hours.  Glucose, capillary     Status: Abnormal   Collection Time: 05/25/21  7:23 AM  Result Value Ref Range    Glucose-Capillary 128 (H) 70 - 99 mg/dL    Comment: Glucose reference range applies only to samples taken after fasting for at least 8 hours.  Glucose, capillary     Status: Abnormal   Collection Time: 05/25/21 12:00 PM  Result Value Ref Range   Glucose-Capillary 193 (H) 70 - 99 mg/dL    Comment: Glucose reference range applies only to samples taken after fasting for at least 8 hours.  Glucose, capillary     Status: Abnormal   Collection Time: 05/25/21  4:44 PM  Result Value Ref Range   Glucose-Capillary 228 (H) 70 - 99 mg/dL    Comment: Glucose reference range applies only to samples taken after fasting for at least 8 hours.    MICRO:  IMAGING: ECHOCARDIOGRAM COMPLETE  Result Date: 05/24/2021    ECHOCARDIOGRAM REPORT   Patient Name:   SANTE BIEDERMANN Date of Exam: 05/24/2021 Medical Rec #:  [id number]        Height:       70.0 in Accession #:    [id number]       Weight:       252.3 lb Date of Birth:  [birth year]         BSA:          2.304 m Patient Age:    75 years         BP:           149/73 mmHg Patient Gender: M                HR:           62 bpm. Exam Location:  Inpatient Procedure: 2D Echo, Cardiac Doppler and Color Doppler Indications:    Cardiomegaly  History:  Patient has no prior history of Echocardiogram examinations,                 most recent 05/13/2021. Angina and CAD, Arrythmias:Atrial                 Fibrillation; Risk Factors:Diabetes, Dyslipidemia and                 Hypertension.  Sonographer:    Luisa Hart RDCS Referring Phys: [place]  Sonographer Comments: Patient is morbidly obese. Image acquisition challenging due to patient body habitus and Image acquisition challenging due to respiratory motion. IMPRESSIONS  1. No obvious regional wall motion abnormaliies are seen, but the apical views are difficult to evaluate. Left ventricular ejection fraction, by estimation, is 45 to 50%. The left ventricle has mildly decreased function. Left ventricular  endocardial border not optimally defined to evaluate regional wall motion. The left ventricular internal cavity size was mildly dilated. Left ventricular diastolic parameters are consistent with Grade II diastolic dysfunction (pseudonormalization). Elevated left atrial pressure.  2. Right ventricular systolic function is normal. The right ventricular size is mildly enlarged. There is mildly elevated pulmonary artery systolic pressure. The estimated right ventricular systolic pressure is 55.7 mmHg.  3. Left atrial size was moderately dilated.  4. Right atrial size was mildly dilated.  5. The mitral valve is normal in structure. No evidence of mitral valve regurgitation.  6. The aortic valve is tricuspid. There is mild calcification of the aortic valve. There is mild thickening of the aortic valve. Aortic valve regurgitation is not visualized. Mild to moderate aortic valve sclerosis/calcification is present, without any evidence of aortic stenosis.  7. Aortic dilatation noted. There is mild dilatation of the aortic root, measuring 40 mm. FINDINGS  Left Ventricle: No obvious regional wall motion abnormaliies are seen, but the apical views are difficult to evaluate. Left ventricular ejection fraction, by estimation, is 45 to 50%. The left ventricle has mildly decreased function. Left ventricular endocardial border not optimally defined to evaluate regional wall motion. The left ventricular internal cavity size was mildly dilated. There is no left ventricular hypertrophy. Left ventricular diastolic parameters are consistent with Grade II diastolic dysfunction (pseudonormalization). Elevated left atrial pressure. Right Ventricle: The right ventricular size is mildly enlarged. No increase in right ventricular wall thickness. Right ventricular systolic function is normal. There is mildly elevated pulmonary artery systolic pressure. The tricuspid regurgitant velocity is 2.89 m/s, and with an assumed right atrial pressure of  3 mmHg, the estimated right ventricular systolic pressure is 32.2 mmHg. Left Atrium: Left atrial size was moderately dilated. Right Atrium: Right atrial size was mildly dilated. Pericardium: There is no evidence of pericardial effusion. Mitral Valve: The mitral valve is normal in structure. Mild mitral annular calcification. No evidence of mitral valve regurgitation. Tricuspid Valve: The tricuspid valve is not well visualized. Tricuspid valve regurgitation is trivial. Aortic Valve: The aortic valve is tricuspid. There is mild calcification of the aortic valve. There is mild thickening of the aortic valve. Aortic valve regurgitation is not visualized. Mild to moderate aortic valve sclerosis/calcification is present, without any evidence of aortic stenosis. Aortic valve mean gradient measures 9.0 mmHg. Aortic valve peak gradient measures 16.2 mmHg. Aortic valve area, by VTI measures 2.84 cm. Pulmonic Valve: The pulmonic valve was grossly normal. Pulmonic valve regurgitation is not visualized. Aorta: Aortic dilatation noted. There is mild dilatation of the aortic root, measuring 40 mm. IAS/Shunts: No atrial level shunt detected by color flow Doppler.  LEFT VENTRICLE PLAX 2D LVIDd:         5.87 cm      Diastology LVIDs:         4.59 cm      LV e' medial:    5.87 cm/s LV PW:         0.80 cm      LV E/e' medial:  18.1 LV IVS:        0.80 cm      LV e' lateral:   7.40 cm/s LVOT diam:     2.30 cm      LV E/e' lateral: 14.3 LV SV:         108 LV SV Index:   47 LVOT Area:     4.15 cm  LV Volumes (MOD) LV vol d, MOD A2C: 62.7 ml LV vol d, MOD A4C: 116.0 ml LV vol s, MOD A2C: 28.6 ml LV vol s, MOD A4C: 71.8 ml LV SV MOD A2C:     34.1 ml LV SV MOD A4C:     116.0 ml LV SV MOD BP:      44.3 ml RIGHT VENTRICLE RV Basal diam:  4.35 cm RV Mid diam:    2.80 cm RV S prime:     17.40 cm/s TAPSE (M-mode): 2.7 cm LEFT ATRIUM             Index       RIGHT ATRIUM           Index LA diam:        4.50 cm 1.95 cm/m  RA Area:     20.70 cm  LA Vol (A2C):   37.4 ml 16.23 ml/m RA Volume:   62.00 ml  26.91 ml/m LA Vol (A4C):   57.0 ml 24.74 ml/m LA Biplane Vol: 46.8 ml 20.31 ml/m  AORTIC VALVE                    PULMONIC VALVE AV Area (Vmax):    3.06 cm     PV Vmax:       1.08 m/s AV Area (Vmean):   3.09 cm     PV Vmean:      78.400 cm/s AV Area (VTI):     2.84 cm     PV VTI:        0.234 m AV Vmax:           201.00 cm/s  PV Peak grad:  4.7 mmHg AV Vmean:          137.000 cm/s PV Mean grad:  3.0 mmHg AV VTI:            0.379 m AV Peak Grad:      16.2 mmHg AV Mean Grad:      9.0 mmHg LVOT Vmax:         148.00 cm/s LVOT Vmean:        102.000 cm/s LVOT VTI:          0.259 m LVOT/AV VTI ratio: 0.68  AORTA Ao Root diam: 4.00 cm Ao Asc diam:  3.50 cm MITRAL VALVE                TRICUSPID VALVE MV Area (PHT): 3.74 cm     TR Peak grad:   33.4 mmHg MV Decel Time: 203 msec     TR Vmax:        289.00 cm/s MV E velocity: 106.00 cm/s MV A velocity: 45.40 cm/s  SHUNTS MV E/A ratio:  2.33         Systemic VTI:  0.26 m                             Systemic Diam: 2.30 cm Sanda Klein MD Electronically signed by Sanda Klein MD Signature Date/Time: 05/24/2021/2:39:41 PM    Final    CT Renal Stone Study  Result Date: 05/23/2021 CLINICAL DATA:  Hydronephrosis, weakness, recently completed antibiotics. Persistent fever EXAM: CT ABDOMEN AND PELVIS WITHOUT CONTRAST TECHNIQUE: Multidetector CT imaging of the abdomen and pelvis was performed following the standard protocol without IV contrast. COMPARISON:  CT 04/30/2021, CT 05/04/2021, ultrasound 05/06/2021 FINDINGS: Lower chest: Small bilateral effusions, new from prior. Some additional more consolidative opacity the left lung base, could be atelectatic or airspace disease. Diffuse interlobular septal thickening and fissural thickening. Cardiac size within normal limits. Coronary artery atherosclerosis. No pericardial effusion. Hepatobiliary: No worrisome focal liver lesions. Smooth liver surface contour. Normal  hepatic attenuation. Normal gallbladder and biliary tree. Pancreas: Partial fatty replacement of the pancreas. No pancreatic ductal dilatation or surrounding inflammatory changes. Spleen: Normal in size. No concerning splenic lesions. Adrenals/Urinary Tract: Normal adrenal glands. Bilateral perinephric stranding, similar to slightly increased from comparison prior. A right double-J nephroureteral stent is in place at the time of exam. Proximal and appropriately terminating in the renal pelvis. Distal and terminating within the urinary bladder. No residual right hydronephrosis. Redemonstration of a heterogeneous mass at the right ureteropelvic junction which appears external to the ureter and kidney, measuring approximately 5.4 x 3.5 cm in size, previously 4.5 x 3 cm in size. Suspect several left parapelvic cysts. No concerning left pelvic lesion. Few small left renal cyst corresponding well to those seen on comparison ultrasound and CT imaging. Urinary bladder is largely decompressed by inflated Foley catheter. Small amount of joint luminal gas is nonspecific given instrumentation. Some degree of bladder wall thickening is expected given underdistention though faint perivesicular hazy stranding is present. Portions of the bladder obscured by streak artifact from a right hip arthroplasty. Stomach/Bowel: Distal esophagus, stomach and duodenal sweep are unremarkable. No small bowel wall thickening or dilatation. No evidence of obstruction. A normal appendix is visualized. No colonic dilatation or wall thickening. Scattered colonic diverticula without focal inflammation to suggest diverticulitis. Vascular/Lymphatic: Extensive mesenteric infiltration. Pathologic mesenteric adenopathy throughout the mid mesentery with a larger dominant mass measuring 5.6 by 3.6 cm in size which appears to decreased in the interim having previously measured 6.7 x 4.1 cm at similar level. There is persistent pathologic adenopathy throughout  the abdomen and pelvis most pronounced in the periaortic chains and central mesentery. Index right common iliac node measures 1.8 cm in short axis (2/63), not significantly changed from prior. Additional index right pelvic sidewall node measuring 1 cm short axis is unchanged from prior as well (2/75). Atherosclerotic plaque throughout the abdominal aorta and branch vessels. No aneurysm or ectasia. Reproductive: Mild prostatomegaly. Seminal vesicles are unremarkable. Other: Mesenteric infiltration. Perinephric retroperitoneal stranding and trace free fluid in the pararenal spaces. No bowel containing hernia. No free air. Musculoskeletal: The osseous structures appear diffusely demineralized which may limit detection of small or nondisplaced fractures. No acute osseous abnormality or suspicious osseous lesion. Prior right hip arthroplasty in expected alignment. No acute or suspicious osseous lesions. IMPRESSION: 1. 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 A lobulated retroperitoneal soft tissue mass towards the right ureteropelvic  junction with resulting compressive mass effect upon the collecting system has slightly increased in size from prior now measuring 5.4 x 3.5 cm. A double-J nephroureteral stent remains in place without significant residual hydronephrosis at this time. 2. A separate large deposit in the upper mesentery as seemingly decreased in the interim while other index lymph nodes remain stable from prior. 3. Extensive perinephric, periureteral and perivesicular stranding, concerning for an ascending tract infection in the setting of sepsis. 4. Patchy area of opacity in posterior left lung base, possibly atelectatic or airspace disease with additional features of developing pulmonary edema and bilateral pleural effusions. 5. Prostatomegaly. 6. Prior right hip arthroplasty. 7. Aortic Atherosclerosis (ICD10-I70.0). 8.  Coronary atherosclerosis. These results were called by telephone at the time of interpretation on 05/23/2021 at 9:46 pm to provider Doutova MD, who verbally acknowledged these results. Electronically Signed   By: Lovena Le M.D.   On: 05/23/2021 21:46    Assessment/Plan:  75yo M with recurrent pseudomonal bacteremia/uti after finishing oral abtx on 7/1. 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  - recurrence concern that stent is colonized or other nidus - recommend to get TEE to ensure that we don't need longer course of therapy - for the time being plan for 2 wks of IV cefepime from negative culture - wait 48hrs from today's culture before placing picc line/port - may need to consider chronic oral suppression with FQ but length of therapy with be difficult to gauge. Will discuss with urology/oncology.  [facility] [phone]

## 2021-05-25 NOTE — Assessment & Plan Note (Addendum)
-   Remains afebrile - Continue neutropenic precautions - ANC 392 >>459; starting to improve; continue precautions until at least >500 - CBC daily

## 2021-05-25 NOTE — Progress Notes (Signed)
Progress Note    MENDY LAPINSKY   MBW:[id number]  DOB: 04/26/1946  DOA: 05/23/2021     2  PCP: [name], MD  CC: fever   Hospital Course: Mr. Repsher is a 75  yo [place] speaking male with PMH recent diagnosis DLBCL (ABC type), CAD s/p PCI with DES to RCA (11/2020), PAF (on Eliquis and amio), CKDIIIa, DMII, HTN, OSA who presented to the hospital with fever.  Recent readmissions from 6/16 - 6/21 and 6/22 - 6/26.  He recently underwent right ureteral stent placement due to tumor burden from retroperitoneal pelvic adenopathy associated with his lymphoma.  He underwent work-up on admission and was felt to have concern for sepsis due to UTI and is admitted for antibiotic treatment and evaluation by urology and oncology.  Interval History:  No events overnight.  He was talking with his son on the phone this morning who assisted with translation. Also called his daughter Derrick Ravel [phone]) who is a hospitalist in [place] per patient request to update her.   ROS: Constitutional: positive for fatigue and malaise, negative for chills, Respiratory: negative for cough, Cardiovascular: negative for chest pain, and Gastrointestinal: negative for abdominal pain  Assessment & Plan: * Sepsis (HCC)-resolved as of 05/25/2021 - fever, tachypnea, leukopenia, source considered urinary - s/p CT renal study (double-J nephroureteral stent in place without residual hydronephrosis) but does show extensive perinephric, periureteral and perivesicular stranding -Continue cefepime - Follow-up cultures (blood and urine cultures now positive for Pseudomonas). Urine cx also has E. Faecalis but likely not infectious -Continue fluids  Severe neutropenia (HCC) - Remains afebrile - Continue neutropenic precautions - ANC 392 today - CBC daily  Bacteremia due to Gram-negative bacteria - See sepsis as well - 1/4 bottles positive from 05/23/21 - Urine culture growing Pseudomonas which he grew last time.   Likely has translocated to the blood due to insufficient treatment from last infection given his immunosuppressed state in setting of malignancy - Likely needs a longer course of treatment than typical bacteremia course given immunosuppression - Some concern would be for stent being infected as well.  No other objects in body of concern for biofilm at this time (Port-A-Cath has not been inserted yet and will be on hold for now) -ID consulted given complexity and for recommendations of duration of treatment - repeat blood cultures 7/11 and monitor for clearance  - Continue cefepime for now  DLBCL (diffuse large B cell lymphoma) (HCC) - Recent diagnosis on needle core biopsy of right retroperitoneal lymph node on 05/04/2021.  Has followed up to establish with Dr. Lorenso Courier, seen on 05/13/2021.  Staging still in process with pending PET scan outpatient and Port-A-Cath to be placed -Has been admitted to the hospital twice in June 2022 and now again this hospitalization due to underlying aggressive subtype of disease and in need of starting chemo soon per oncology -Oncology following during hospitalization, appreciate assistance  Acute renal failure superimposed on stage 3a chronic kidney disease ([place]) - patient has history of CKD3a. Baseline creat ~ 1.4, eGFR 53 - patient presents with increase in creat >0.3 mg/dL above baseline presumed to have occurred within past 7 days PTA - creat 1.73 and has been ~1.8 during recent hospitalizations; etiology still presumed due to mass effect/compression from retroperitoneal pelvic adenopathy which is increased in size (now measuring 5.4 x 3.5 cm) - ultimately needs lymphoma treatment; for now continue IVF and treating infection as able   Hydronephrosis of right kidney - s/p right ureteral stent placement  on 6/17   BPH (benign prostatic hyperplasia) On- also recent hydronephrosis due to tumor burden; he is s/p right ureteral placement on 6/17 with Dr.  Junious Silk -Hydronephrosis shows improvement in admission CT renal stone study - Urology following, appreciate assistance - Foley to remain in place per urology rec's until TOV deferred to urology -Continue finasteride.  Recommended to add back silodosin 8 mg daily once able/stable prior to void trial -Continue Flomax  Normocytic anemia - folate (7.7), B12 (312) - iron stores low - likely little benefit for folate/b12 replacement at this time, hold off as well - hold off on iron for now given active malignancy  Prolonged QT interval - continue tele   Hypomagnesemia - Replete and recheck as needed  Hyperlipidemia associated with type 2 diabetes mellitus ([place]) - Continue Lipitor for now in setting of recent stenting but mortality benefit is questionable at this point  Sleep apnea - continue CPAP qhs  Complicated UTI (urinary tract infection) - see sepsis  PAF (paroxysmal atrial fibrillation) (HCC) - continue amio and Eliquis  - will need to hold Eliquis if any plans for surgical intervention   DM2 (diabetes mellitus, type 2) ([place]) - continue Lantus, SSI, and CBG monitoring - A1c = 8.1%  CAD (coronary artery disease) - s/p recent PCI to RCA in Jan 2022 - continue statin, plavix, eliquis    Old records reviewed in assessment of this patient  Antimicrobials: Cefepime 05/23/2021 >> current  DVT prophylaxis: SCDs Start: 05/23/21 2140 apixaban (ELIQUIS) tablet 5 mg   Code Status:   Code Status: Full Code Family Communication:   Disposition Plan: Status is: Inpatient  Remains inpatient appropriate because:IV treatments appropriate due to intensity of illness or inability to take PO and Inpatient level of care appropriate due to severity of illness  Dispo: The patient is from: Home              Anticipated d/c is to: Home              Patient currently is not medically stable to d/c.   Difficult to place patient No Risk of unplanned readmission score: Unplanned  Admission- Pilot do not use: 32.16   Objective: Blood pressure 131/73, pulse 67, temperature 97.7 F (36.5 C), temperature source Oral, resp. rate (!) 21, SpO2 98 %.  Examination: General appearance: alert, cooperative, and no distress Head: Normocephalic, without obvious abnormality, atraumatic Eyes:  EOMI Lungs: clear to auscultation bilaterally Heart: regular rate and rhythm and S1, S2 normal Back: No CVA tenderness Abdomen: normal findings: bowel sounds normal and soft, non-tender Extremities:  No edema Skin: mobility and turgor normal Neurologic: Grossly normal  Consultants:  Urology Oncology  Procedures:    Data Reviewed: I have personally reviewed following labs and imaging studies Results for orders placed or performed during the hospital encounter of 05/23/21 (from the past 24 hour(s))  Glucose, capillary     Status: Abnormal   Collection Time: 05/24/21  5:22 PM  Result Value Ref Range   Glucose-Capillary 175 (H) 70 - 99 mg/dL  Glucose, capillary     Status: Abnormal   Collection Time: 05/24/21  7:43 PM  Result Value Ref Range   Glucose-Capillary 174 (H) 70 - 99 mg/dL  Glucose, capillary     Status: Abnormal   Collection Time: 05/24/21 11:59 PM  Result Value Ref Range   Glucose-Capillary 257 (H) 70 - 99 mg/dL  Basic metabolic panel     Status: Abnormal   Collection Time: 05/25/21  3:38 AM  Result Value Ref Range   Sodium 139 135 - 145 mmol/L   Potassium 3.6 3.5 - 5.1 mmol/L   Chloride 109 98 - 111 mmol/L   CO2 25 22 - 32 mmol/L   Glucose, Bld 167 (H) 70 - 99 mg/dL   BUN 26 (H) 8 - 23 mg/dL   Creatinine, Ser 1.63 (H) 0.61 - 1.24 mg/dL   Calcium 8.1 (L) 8.9 - 10.3 mg/dL   GFR, Estimated 44 (L) >60 mL/min   Anion gap 5 5 - 15  CBC with Differential/Platelet     Status: Abnormal   Collection Time: 05/25/21  3:38 AM  Result Value Ref Range   WBC 2.8 (L) 4.0 - 10.5 K/uL   RBC 3.31 (L) 4.22 - 5.81 MIL/uL   Hemoglobin 8.7 (L) 13.0 - 17.0 g/dL   HCT 27.2 (L)  39.0 - 52.0 %   MCV 82.2 80.0 - 100.0 fL   MCH 26.3 26.0 - 34.0 pg   MCHC 32.0 30.0 - 36.0 g/dL   RDW 16.9 (H) 11.5 - 15.5 %   Platelets 130 (L) 150 - 400 K/uL   nRBC 0.0 0.0 - 0.2 %   Neutrophils Relative % 14 %   Neutro Abs 0.4 (LL) 1.7 - 7.7 K/uL   Lymphocytes Relative 46 %   Lymphs Abs 1.3 0.7 - 4.0 K/uL   Monocytes Relative 35 %   Monocytes Absolute 1.0 0.1 - 1.0 K/uL   Eosinophils Relative 4 %   Eosinophils Absolute 0.1 0.0 - 0.5 K/uL   Basophils Relative 1 %   Basophils Absolute 0.0 0.0 - 0.1 K/uL   Immature Granulocytes 0 %   Abs Immature Granulocytes 0.01 0.00 - 0.07 K/uL  Magnesium     Status: None   Collection Time: 05/25/21  3:38 AM  Result Value Ref Range   Magnesium 2.0 1.7 - 2.4 mg/dL  Glucose, capillary     Status: Abnormal   Collection Time: 05/25/21  4:09 AM  Result Value Ref Range   Glucose-Capillary 154 (H) 70 - 99 mg/dL  Glucose, capillary     Status: Abnormal   Collection Time: 05/25/21  7:23 AM  Result Value Ref Range   Glucose-Capillary 128 (H) 70 - 99 mg/dL  Glucose, capillary     Status: Abnormal   Collection Time: 05/25/21 12:00 PM  Result Value Ref Range   Glucose-Capillary 193 (H) 70 - 99 mg/dL    Recent Results (from the past 240 hour(s))  Culture, Urine     Status: Abnormal (Preliminary result)   Collection Time: 05/23/21  3:40 PM   Specimen: Urine, Random  Result Value Ref Range Status   Specimen Description   Final    URINE, RANDOM Performed at [facility], [place] [address], [place], [place] [address]    Special Requests   Final    NONE Performed at [facility], [place] [address], [place], [place] [address]    Culture (A)  Final    >=100,000 COLONIES/mL PSEUDOMONAS AERUGINOSA 80,000 COLONIES/mL ENTEROCOCCUS FAECALIS    Report Status PENDING  Incomplete  Resp Panel by RT-PCR (Flu A&B, Covid) Urine, Clean Catch     Status: None   Collection Time: 05/23/21  3:41 PM   Specimen: Urine, Clean Catch;  Nasopharyngeal(NP) swabs in vial transport medium  Result Value Ref Range Status   SARS Coronavirus 2 by RT PCR NEGATIVE NEGATIVE Final    Comment: (NOTE) SARS-CoV-2 target nucleic acids are NOT DETECTED.  The SARS-CoV-2 RNA is generally detectable in upper respiratory specimens during the acute phase of infection. The lowest concentration of SARS-CoV-2 viral copies this assay can detect is 138 copies/mL. A negative result does not preclude SARS-Cov-2 infection and should not be used as the sole basis for treatment or other patient management decisions. A negative result may occur with  improper specimen collection/handling, submission of specimen other than nasopharyngeal swab, presence of viral mutation(s) within the areas targeted by this assay, and inadequate number of viral copies(<138 copies/mL). A negative result must be combined with clinical observations, patient history, and epidemiological information. The expected result is Negative.  Fact Sheet for Patients:  EntrepreneurPulse.com.au  Fact Sheet for Healthcare Providers:  IncredibleEmployment.be  This test is no t yet approved or cleared by the [place] FDA and  has been authorized for detection and/or diagnosis of SARS-CoV-2 by FDA under an Emergency Use Authorization (EUA). This EUA will remain  in effect (meaning this test can be used) for the duration of the COVID-19 declaration under Section 564(b)(1) of the Act, 21 U.S.C.section 360bbb-3(b)(1), unless the authorization is terminated  or revoked sooner.       Influenza A by PCR NEGATIVE NEGATIVE Final   Influenza B by PCR NEGATIVE NEGATIVE Final    Comment: (NOTE) The Xpert Xpress SARS-CoV-2/FLU/RSV plus assay is intended as an aid in the diagnosis of influenza from Nasopharyngeal swab specimens and should not be used as a sole basis for treatment. Nasal washings and aspirates are unacceptable for Xpert Xpress  SARS-CoV-2/FLU/RSV testing.  Fact Sheet for Patients: EntrepreneurPulse.com.au  Fact Sheet for Healthcare Providers: IncredibleEmployment.be  This test is not yet approved or cleared by the [place] FDA and has been authorized for detection and/or diagnosis of SARS-CoV-2 by FDA under an Emergency Use Authorization (EUA). This EUA will remain in effect (meaning this test can be used) for the duration of the COVID-19 declaration under Section 564(b)(1) of the Act, 21 U.S.C. section 360bbb-3(b)(1), unless the authorization is terminated or revoked.  Performed at [facility], [place] [address] [place], [place] [address]   Culture, blood (x 2)     Status: Abnormal (Preliminary result)   Collection Time: 05/23/21  3:43 PM   Specimen: BLOOD  Result Value Ref Range Status   Specimen Description   Final    BLOOD RIGHT ANTECUBITAL Performed at [facility], [place] [address], [place], [place] [address]    Special Requests   Final    BOTTLES DRAWN AEROBIC AND ANAEROBIC Blood Culture adequate volume Performed at [place] [address], [place], [place] [address]    Culture  Setup Time   Final    GRAM NEGATIVE RODS ANAEROBIC BOTTLE ONLY CRITICAL RESULT CALLED TO, READ BACK BY AND VERIFIED WITH: PHARM D C.SHADE ON [id number] AT 1703 BY E.PARRISH    Culture (A)  Final    PSEUDOMONAS AERUGINOSA SUSCEPTIBILITIES TO FOLLOW Performed at [facility], [place] [address], [place],  [address]    Report Status PENDING  Incomplete  Blood Culture ID Panel (Reflexed)     Status: Abnormal   Collection Time: 05/23/21  3:43 PM  Result Value Ref Range Status   Enterococcus faecalis NOT DETECTED NOT DETECTED Final   Enterococcus Faecium NOT DETECTED NOT DETECTED Final   Listeria monocytogenes NOT DETECTED NOT DETECTED Final   Staphylococcus species NOT DETECTED NOT DETECTED Final   Staphylococcus aureus  (BCID) NOT DETECTED NOT DETECTED Final  Staphylococcus epidermidis NOT DETECTED NOT DETECTED Final   Staphylococcus lugdunensis NOT DETECTED NOT DETECTED Final   Streptococcus species NOT DETECTED NOT DETECTED Final   Streptococcus agalactiae NOT DETECTED NOT DETECTED Final   Streptococcus pneumoniae NOT DETECTED NOT DETECTED Final   Streptococcus pyogenes NOT DETECTED NOT DETECTED Final   A.calcoaceticus-baumannii NOT DETECTED NOT DETECTED Final   Bacteroides fragilis NOT DETECTED NOT DETECTED Final   Enterobacterales NOT DETECTED NOT DETECTED Final   Enterobacter cloacae complex NOT DETECTED NOT DETECTED Final   Escherichia coli NOT DETECTED NOT DETECTED Final   Klebsiella aerogenes NOT DETECTED NOT DETECTED Final   Klebsiella oxytoca NOT DETECTED NOT DETECTED Final   Klebsiella pneumoniae NOT DETECTED NOT DETECTED Final   Proteus species NOT DETECTED NOT DETECTED Final   Salmonella species NOT DETECTED NOT DETECTED Final   Serratia marcescens NOT DETECTED NOT DETECTED Final   Haemophilus influenzae NOT DETECTED NOT DETECTED Final   Neisseria meningitidis NOT DETECTED NOT DETECTED Final   Pseudomonas aeruginosa DETECTED (A) NOT DETECTED Final    Comment: CRITICAL RESULT CALLED TO, READ BACK BY AND VERIFIED WITH: PHARM D C.SHADE ON [id number] AT 1703 BY E.PARRISH    Stenotrophomonas maltophilia NOT DETECTED NOT DETECTED Final   Candida albicans NOT DETECTED NOT DETECTED Final   Candida auris NOT DETECTED NOT DETECTED Final   Candida glabrata NOT DETECTED NOT DETECTED Final   Candida krusei NOT DETECTED NOT DETECTED Final   Candida parapsilosis NOT DETECTED NOT DETECTED Final   Candida tropicalis NOT DETECTED NOT DETECTED Final   Cryptococcus neoformans/gattii NOT DETECTED NOT DETECTED Final   CTX-M ESBL NOT DETECTED NOT DETECTED Final   Carbapenem resistance IMP NOT DETECTED NOT DETECTED Final   Carbapenem resistance KPC NOT DETECTED NOT DETECTED Final   Carbapenem resistance NDM  NOT DETECTED NOT DETECTED Final   Carbapenem resistance VIM NOT DETECTED NOT DETECTED Final    Comment: Performed at [facility], [place] [address], [place], [place] [address]  Culture, blood (x 2)     Status: None (Preliminary result)   Collection Time: 05/23/21  3:45 PM   Specimen: BLOOD LEFT ARM  Result Value Ref Range Status   Specimen Description   Final    BLOOD LEFT ARM Performed at [facility] [address], [place], [place] [address]    Special Requests   Final    BOTTLES DRAWN AEROBIC AND ANAEROBIC Blood Culture adequate volume Performed at [facility] [address] [place], [place] [address]    Culture   Final    NO GROWTH 2 DAYS Performed at [facility] [address] [place], [place] [address]    Report Status PENDING  Incomplete  MRSA Next Gen by PCR, Nasal     Status: None   Collection Time: 05/23/21  6:56 PM   Specimen: Nasal Mucosa; Nasal Swab  Result Value Ref Range Status   MRSA by PCR Next Gen NOT DETECTED NOT DETECTED Final    Comment: (NOTE) The GeneXpert MRSA Assay (FDA approved for NASAL specimens only), is one component of a comprehensive MRSA colonization surveillance program. It is not intended to diagnose MRSA infection nor to guide or monitor treatment for MRSA infections. Test performance is not FDA approved in patients less than 13 years old. Performed at [facility], [place] [address] [place], [place] [address]      Radiology Studies: [facility] Chest Port 1 View  Result Date: 05/23/2021 CLINICAL DATA:  Fever EXAM: PORTABLE CHEST 1 VIEW COMPARISON:  05/06/2021 FINDINGS: Low volume AP portable examination with mild, diffuse bilateral interstitial opacity and heterogeneous opacity in the retrocardiac left lung base. Cardiomegaly. The visualized skeletal structures are unremarkable. IMPRESSION: 1. Low volume AP portable examination with mild, diffuse bilateral interstitial opacity  and heterogeneous opacity in the retrocardiac left lung base, which may reflect atelectasis in the setting of cardiomegaly or alternately infection/aspiration. 2. Cardiomegaly. Electronically Signed   By: Eddie Candle M.D.   On: 05/23/2021 15:49   ECHOCARDIOGRAM COMPLETE  Result Date: 05/24/2021    ECHOCARDIOGRAM REPORT   Patient Name:   FREDDRICK GLADSON Date of Exam: 05/24/2021 Medical Rec #:  [id number]        Height:       70.0 in Accession #:    [id number]       Weight:       252.3 lb Date of Birth:  11-02-1946         BSA:          2.304 m Patient Age:    72 years         BP:           149/73 mmHg Patient Gender: M                HR:           62 bpm. Exam Location:  Inpatient Procedure: 2D Echo, Cardiac Doppler and Color Doppler Indications:    Cardiomegaly  History:        Patient has no prior history of Echocardiogram examinations,                 most recent 05/13/2021. Angina and CAD, Arrythmias:Atrial                 Fibrillation; Risk Factors:Diabetes, Dyslipidemia and                 Hypertension.  Sonographer:    Luisa Hart RDCS Referring Phys: [place]  Sonographer Comments: Patient is morbidly obese. Image acquisition challenging due to patient body habitus and Image acquisition challenging due to respiratory motion. IMPRESSIONS  1. No obvious regional wall motion abnormaliies are seen, but the apical views are difficult to evaluate. Left ventricular ejection fraction, by estimation, is 45 to 50%. The left ventricle has mildly decreased function. Left ventricular endocardial border not optimally defined to evaluate regional wall motion. The left ventricular internal cavity size was mildly dilated. Left ventricular diastolic parameters are consistent with Grade II diastolic dysfunction (pseudonormalization). Elevated left atrial pressure.  2. Right ventricular systolic function is normal. The right ventricular size is mildly enlarged. There is mildly elevated pulmonary artery systolic  pressure. The estimated right ventricular systolic pressure is 41.2 mmHg.  3. Left atrial size was moderately dilated.  4. Right atrial size was mildly dilated.  5. The mitral valve is normal in structure. No evidence of mitral valve regurgitation.  6. The aortic valve is tricuspid. There is mild calcification of the aortic valve. There is mild thickening of the aortic valve. Aortic valve regurgitation is not visualized. Mild to moderate aortic valve sclerosis/calcification is present, without any evidence of aortic stenosis.  7. Aortic dilatation noted. There is mild dilatation of the aortic root, measuring 40 mm. FINDINGS  Left Ventricle: No obvious regional wall motion abnormaliies are seen, but the apical views are difficult to evaluate. Left ventricular ejection fraction, by estimation, is 45 to 50%. The left ventricle  has mildly decreased function. Left ventricular endocardial border not optimally defined to evaluate regional wall motion. The left ventricular internal cavity size was mildly dilated. There is no left ventricular hypertrophy. Left ventricular diastolic parameters are consistent with Grade II diastolic dysfunction (pseudonormalization). Elevated left atrial pressure. Right Ventricle: The right ventricular size is mildly enlarged. No increase in right ventricular wall thickness. Right ventricular systolic function is normal. There is mildly elevated pulmonary artery systolic pressure. The tricuspid regurgitant velocity is 2.89 m/s, and with an assumed right atrial pressure of 3 mmHg, the estimated right ventricular systolic pressure is 22.0 mmHg. Left Atrium: Left atrial size was moderately dilated. Right Atrium: Right atrial size was mildly dilated. Pericardium: There is no evidence of pericardial effusion. Mitral Valve: The mitral valve is normal in structure. Mild mitral annular calcification. No evidence of mitral valve regurgitation. Tricuspid Valve: The tricuspid valve is not well  visualized. Tricuspid valve regurgitation is trivial. Aortic Valve: The aortic valve is tricuspid. There is mild calcification of the aortic valve. There is mild thickening of the aortic valve. Aortic valve regurgitation is not visualized. Mild to moderate aortic valve sclerosis/calcification is present, without any evidence of aortic stenosis. Aortic valve mean gradient measures 9.0 mmHg. Aortic valve peak gradient measures 16.2 mmHg. Aortic valve area, by VTI measures 2.84 cm. Pulmonic Valve: The pulmonic valve was grossly normal. Pulmonic valve regurgitation is not visualized. Aorta: Aortic dilatation noted. There is mild dilatation of the aortic root, measuring 40 mm. IAS/Shunts: No atrial level shunt detected by color flow Doppler.  LEFT VENTRICLE PLAX 2D LVIDd:         5.87 cm      Diastology LVIDs:         4.59 cm      LV e' medial:    5.87 cm/s LV PW:         0.80 cm      LV E/e' medial:  18.1 LV IVS:        0.80 cm      LV e' lateral:   7.40 cm/s LVOT diam:     2.30 cm      LV E/e' lateral: 14.3 LV SV:         108 LV SV Index:   47 LVOT Area:     4.15 cm  LV Volumes (MOD) LV vol d, MOD A2C: 62.7 ml LV vol d, MOD A4C: 116.0 ml LV vol s, MOD A2C: 28.6 ml LV vol s, MOD A4C: 71.8 ml LV SV MOD A2C:     34.1 ml LV SV MOD A4C:     116.0 ml LV SV MOD BP:      44.3 ml RIGHT VENTRICLE RV Basal diam:  4.35 cm RV Mid diam:    2.80 cm RV S prime:     17.40 cm/s TAPSE (M-mode): 2.7 cm LEFT ATRIUM             Index       RIGHT ATRIUM           Index LA diam:        4.50 cm 1.95 cm/m  RA Area:     20.70 cm LA Vol (A2C):   37.4 ml 16.23 ml/m RA Volume:   62.00 ml  26.91 ml/m LA Vol (A4C):   57.0 ml 24.74 ml/m LA Biplane Vol: 46.8 ml 20.31 ml/m  AORTIC VALVE  PULMONIC VALVE AV Area (Vmax):    3.06 cm     PV Vmax:       1.08 m/s AV Area (Vmean):   3.09 cm     PV Vmean:      78.400 cm/s AV Area (VTI):     2.84 cm     PV VTI:        0.234 m AV Vmax:           201.00 cm/s  PV Peak grad:  4.7 mmHg AV  Vmean:          137.000 cm/s PV Mean grad:  3.0 mmHg AV VTI:            0.379 m AV Peak Grad:      16.2 mmHg AV Mean Grad:      9.0 mmHg LVOT Vmax:         148.00 cm/s LVOT Vmean:        102.000 cm/s LVOT VTI:          0.259 m LVOT/AV VTI ratio: 0.68  AORTA Ao Root diam: 4.00 cm Ao Asc diam:  3.50 cm MITRAL VALVE                TRICUSPID VALVE MV Area (PHT): 3.74 cm     TR Peak grad:   33.4 mmHg MV Decel Time: 203 msec     TR Vmax:        289.00 cm/s MV E velocity: 106.00 cm/s MV A velocity: 45.40 cm/s   SHUNTS MV E/A ratio:  2.33         Systemic VTI:  0.26 m                             Systemic Diam: 2.30 cm Dani Gobble Croitoru MD Electronically signed by Sanda Klein MD Signature Date/Time: 05/24/2021/2:39:41 PM    Final    CT Renal Stone Study  Result Date: 05/23/2021 CLINICAL DATA:  Hydronephrosis, weakness, recently completed antibiotics. Persistent fever EXAM: CT ABDOMEN AND PELVIS WITHOUT CONTRAST TECHNIQUE: Multidetector CT imaging of the abdomen and pelvis was performed following the standard protocol without IV contrast. COMPARISON:  CT 04/30/2021, CT 05/04/2021, ultrasound 05/06/2021 FINDINGS: Lower chest: Small bilateral effusions, new from prior. Some additional more consolidative opacity the left lung base, could be atelectatic or airspace disease. Diffuse interlobular septal thickening and fissural thickening. Cardiac size within normal limits. Coronary artery atherosclerosis. No pericardial effusion. Hepatobiliary: No worrisome focal liver lesions. Smooth liver surface contour. Normal hepatic attenuation. Normal gallbladder and biliary tree. Pancreas: Partial fatty replacement of the pancreas. No pancreatic ductal dilatation or surrounding inflammatory changes. Spleen: Normal in size. No concerning splenic lesions. Adrenals/Urinary Tract: Normal adrenal glands. Bilateral perinephric stranding, similar to slightly increased from comparison prior. A right double-J nephroureteral stent is in place at  the time of exam. Proximal and appropriately terminating in the renal pelvis. Distal and terminating within the urinary bladder. No residual right hydronephrosis. Redemonstration of a heterogeneous mass at the right ureteropelvic junction which appears external to the ureter and kidney, measuring approximately 5.4 x 3.5 cm in size, previously 4.5 x 3 cm in size. Suspect several left parapelvic cysts. No concerning left pelvic lesion. Few small left renal cyst corresponding well to those seen on comparison ultrasound and CT imaging. Urinary bladder is largely decompressed by inflated Foley catheter. Small amount of joint luminal gas is nonspecific given instrumentation. Some degree of bladder wall  thickening is expected given underdistention though faint perivesicular hazy stranding is present. Portions of the bladder obscured by streak artifact from a right hip arthroplasty. Stomach/Bowel: Distal esophagus, stomach and duodenal sweep are unremarkable. No small bowel wall thickening or dilatation. No evidence of obstruction. A normal appendix is visualized. No colonic dilatation or wall thickening. Scattered colonic diverticula without focal inflammation to suggest diverticulitis. Vascular/Lymphatic: Extensive mesenteric infiltration. Pathologic mesenteric adenopathy throughout the mid mesentery with a larger dominant mass measuring 5.6 by 3.6 cm in size which appears to decreased in the interim having previously measured 6.7 x 4.1 cm at similar level. There is persistent pathologic adenopathy throughout the abdomen and pelvis most pronounced in the periaortic chains and central mesentery. Index right common iliac node measures 1.8 cm in short axis (2/63), not significantly changed from prior. Additional index right pelvic sidewall node measuring 1 cm short axis is unchanged from prior as well (2/75). Atherosclerotic plaque throughout the abdominal aorta and branch vessels. No aneurysm or ectasia. Reproductive: Mild  prostatomegaly. Seminal vesicles are unremarkable. Other: Mesenteric infiltration. Perinephric retroperitoneal stranding and trace free fluid in the pararenal spaces. No bowel containing hernia. No free air. Musculoskeletal: The osseous structures appear diffusely demineralized which may limit detection of small or nondisplaced fractures. No acute osseous abnormality or suspicious osseous lesion. Prior right hip arthroplasty in expected alignment. No acute or suspicious osseous lesions. IMPRESSION: 1. 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 A lobulated retroperitoneal soft tissue mass towards the right ureteropelvic junction with resulting compressive mass effect upon the collecting system has slightly increased in size from prior now measuring 5.4 x 3.5 cm. A double-J nephroureteral stent remains in place without significant residual hydronephrosis at this time. 2. A separate large deposit in the upper mesentery as seemingly decreased in the interim while other index lymph nodes remain stable from prior. 3. Extensive perinephric, periureteral and perivesicular stranding, concerning for an ascending tract infection in the setting of sepsis. 4. Patchy area of opacity in posterior left lung base, possibly atelectatic or airspace disease with additional features of developing pulmonary edema and bilateral pleural effusions. 5. Prostatomegaly. 6. Prior right hip arthroplasty. 7. Aortic Atherosclerosis (ICD10-I70.0). 8. Coronary atherosclerosis. These results were called by telephone at the time of interpretation on 05/23/2021 at 9:46 pm to provider Doutova MD, who verbally acknowledged these results. Electronically Signed   By: Lovena Le M.D.   On: 05/23/2021 21:46   CT Renal Stone Study  Final Result    DG Chest Port 1 View  Final Result      Scheduled Meds:  amiodarone  200 mg Oral q morning   apixaban  5 mg Oral BID    atorvastatin  20 mg Oral QHS   Chlorhexidine Gluconate Cloth  6 each Topical Daily   clopidogrel  75 mg Oral Daily   finasteride  5 mg Oral QHS   insulin aspart  0-9 Units Subcutaneous Q4H   insulin glargine  25 Units Subcutaneous QHS   metoprolol tartrate  50 mg Oral BID   tamsulosin  0.4 mg Oral QPC supper   PRN Meds: acetaminophen **OR** acetaminophen, HYDROcodone-acetaminophen Continuous Infusions:  sodium chloride 75 mL/hr (05/25/21 1212)   ceFEPime (MAXIPIME) IV 2 g (05/25/21 0857)     LOS: 2 days  Time spent: Greater than 50% of the 35 minute visit was spent in counseling/coordination of care for the patient as laid out in the A&P.   Dwyane Dee, MD  [facility] 05/25/2021, 2:32 PM

## 2021-05-26 LAB — CBC WITH DIFFERENTIAL/PLATELET
Abs Immature Granulocytes: 0.01 10*3/uL (ref 0.00–0.07)
Basophils Absolute: 0 10*3/uL (ref 0.0–0.1)
Basophils Relative: 1 %
Eosinophils Absolute: 0.3 10*3/uL (ref 0.0–0.5)
Eosinophils Relative: 9 %
HCT: 27.2 % — ABNORMAL LOW (ref 39.0–52.0)
Hemoglobin: 8.5 g/dL — ABNORMAL LOW (ref 13.0–17.0)
Immature Granulocytes: 0 %
Lymphocytes Relative: 47 %
Lymphs Abs: 1.3 10*3/uL (ref 0.7–4.0)
MCH: 26 pg (ref 26.0–34.0)
MCHC: 31.3 g/dL (ref 30.0–36.0)
MCV: 83.2 fL (ref 80.0–100.0)
Monocytes Absolute: 0.7 10*3/uL (ref 0.1–1.0)
Monocytes Relative: 26 %
Neutro Abs: 0.5 10*3/uL — ABNORMAL LOW (ref 1.7–7.7)
Neutrophils Relative %: 17 %
Platelets: 147 10*3/uL — ABNORMAL LOW (ref 150–400)
RBC: 3.27 MIL/uL — ABNORMAL LOW (ref 4.22–5.81)
RDW: 16.4 % — ABNORMAL HIGH (ref 11.5–15.5)
WBC: 2.7 10*3/uL — ABNORMAL LOW (ref 4.0–10.5)
nRBC: 0 % (ref 0.0–0.2)

## 2021-05-26 LAB — BASIC METABOLIC PANEL
Anion gap: 6 (ref 5–15)
BUN: 26 mg/dL — ABNORMAL HIGH (ref 8–23)
CO2: 25 mmol/L (ref 22–32)
Calcium: 8.2 mg/dL — ABNORMAL LOW (ref 8.9–10.3)
Chloride: 109 mmol/L (ref 98–111)
Creatinine, Ser: 1.61 mg/dL — ABNORMAL HIGH (ref 0.61–1.24)
GFR, Estimated: 45 mL/min — ABNORMAL LOW (ref >60)
Glucose, Bld: 137 mg/dL — ABNORMAL HIGH (ref 70–99)
Potassium: 3.5 mmol/L (ref 3.5–5.1)
Sodium: 140 mmol/L (ref 135–145)

## 2021-05-26 LAB — CULTURE, BLOOD (ROUTINE X 2): Special Requests: ADEQUATE

## 2021-05-26 LAB — GLUCOSE, CAPILLARY
Glucose-Capillary: 120 mg/dL — ABNORMAL HIGH (ref 70–99)
Glucose-Capillary: 152 mg/dL — ABNORMAL HIGH (ref 70–99)
Glucose-Capillary: 181 mg/dL — ABNORMAL HIGH (ref 70–99)
Glucose-Capillary: 222 mg/dL — ABNORMAL HIGH (ref 70–99)
Glucose-Capillary: 245 mg/dL — ABNORMAL HIGH (ref 70–99)
Glucose-Capillary: 286 mg/dL — ABNORMAL HIGH (ref 70–99)

## 2021-05-26 LAB — MAGNESIUM: Magnesium: 1.8 mg/dL (ref 1.7–2.4)

## 2021-05-26 MED ORDER — INSULIN GLARGINE 100 UNIT/ML ~~LOC~~ SOLN
12.0000 [IU] | Freq: Once | SUBCUTANEOUS | Status: AC
  Administered 2021-05-28: 12 [IU] via SUBCUTANEOUS
  Filled 2021-05-26: qty 0.12

## 2021-05-26 MED ORDER — INSULIN GLARGINE 100 UNIT/ML ~~LOC~~ SOLN
25.0000 [IU] | Freq: Every day | SUBCUTANEOUS | Status: AC
  Administered 2021-05-26 – 2021-05-27 (×2): 25 [IU] via SUBCUTANEOUS
  Filled 2021-05-26 (×2): qty 0.25

## 2021-05-26 MED ORDER — INSULIN GLARGINE 100 UNIT/ML ~~LOC~~ SOLN: 25.0000 [IU] | Freq: Every day | SUBCUTANEOUS | Status: DC

## 2021-05-26 NOTE — Care Management Important Message (Signed)
Important Message  Patient Details IM Letter given to the Patient. Name: [name] MRN: [id number] Date of Birth: 1946-09-26   Medicare Important Message Given:  Yes     Kerin Salen 05/26/2021, 11:16 AM

## 2021-05-26 NOTE — Progress Notes (Signed)
Pt declined nocturnal cpap tonight.  RN aware.

## 2021-05-26 NOTE — Progress Notes (Signed)
Progress Note    [name]   TMH:[id number]  DOB: Apr 28, 1946  DOA: 05/23/2021     3  PCP: [name], MD  CC: fever   Hospital Course: Mr. [name] is a 75  yo [place] speaking male with PMH recent diagnosis DLBCL (ABC type), CAD s/p PCI with DES to RCA (11/2020), PAF (on Eliquis and amio), CKDIIIa, DMII, HTN, OSA who presented to the hospital with fever.  Recent readmissions from 6/16 - 6/21 and 6/22 - 6/26.  He recently underwent right ureteral stent placement 05/01/21 due to tumor burden from retroperitoneal pelvic adenopathy associated with his lymphoma.  He underwent work-up on admission and was felt to have concern for sepsis due to UTI and is admitted for antibiotic treatment and evaluation by urology and oncology.  Interval History:  Resting in bed comfortably this morning.  Has essentially no concerns or pain complaints.  Some discomfort from Foley but otherwise has been doing okay.  Denies fevers, chills, sweats.  Explained to him the tentative plan for TEE. iPad interpreter used for conversation.  ROS: Constitutional: positive for fatigue and malaise, negative for chills, Respiratory: negative for cough, Cardiovascular: negative for chest pain, and Gastrointestinal: negative for abdominal pain  Assessment & Plan: * Sepsis (HCC)-resolved as of 05/25/2021 - fever, tachypnea, leukopenia, source considered urinary - s/p CT renal study (double-J nephroureteral stent in place without residual hydronephrosis) but does show extensive perinephric, periureteral and perivesicular stranding -Continue cefepime - Follow-up cultures (blood and urine cultures now positive for Pseudomonas). Urine cx also has E. Faecalis but likely not infectious  Severe neutropenia (HCC) - Remains afebrile - Continue neutropenic precautions - ANC 392 >>459; starting to improve; continue precautions until at least >500 - CBC daily  Bacteremia due to Gram-negative bacteria - See sepsis as  well - 1/4 bottles positive from 05/23/21 - Urine culture growing Pseudomonas which he grew last time.  Likely has translocated to the blood due to insufficient treatment from last infection given his immunosuppressed state in setting of malignancy - Some concern would be for stent being infected as well.  No other objects in body of concern for biofilm at this time (Port-A-Cath has not been inserted yet and will be on hold for now) -ID consulted given complexity and for recommendations of duration of treatment - repeat blood cultures 7/11 and monitor for clearance  - Continue cefepime for now - TEE recommended per ID; cardiology consulted and aware on 7/12; planning in the works   DLBCL (diffuse large B cell lymphoma) ([place]) - Recent diagnosis on needle core biopsy of right retroperitoneal lymph node on 05/04/2021.  Has followed up to establish with Dr. Lorenso Courier, seen on 05/13/2021.  Staging still in process with pending PET scan outpatient and Port-A-Cath to be placed -Has been admitted to the hospital twice in June 2022 and now again this hospitalization due to underlying aggressive subtype of disease and in need of starting chemo soon per oncology -Oncology following during hospitalization, appreciate assistance  Acute renal failure superimposed on stage 3a chronic kidney disease ([place]) - patient has history of CKD3a. Baseline creat ~ 1.4, eGFR 53 - patient presents with increase in creat >0.3 mg/dL above baseline presumed to have occurred within past 7 days PTA - creat 1.73 and has been ~1.8 during recent hospitalizations; etiology still presumed due to mass effect/compression from retroperitoneal pelvic adenopathy which is increased in size (now measuring 5.4 x 3.5 cm) - ultimately needs lymphoma treatment - d/c IVF on 7/12  and monitor BMP   Hydronephrosis of right kidney - s/p right ureteral stent placement on 6/17   BPH (benign prostatic hyperplasia) On- also recent hydronephrosis due to  tumor burden; he is s/p right ureteral placement on 6/17 with Dr. Junious Silk -Hydronephrosis shows improvement in admission CT renal stone study - Urology following, appreciate assistance - Foley to remain in place per urology rec's until TOV deferred to urology -Continue finasteride.  Recommended to add back silodosin 8 mg daily once able/stable prior to void trial -Continue Flomax  Normocytic anemia - folate (7.7), B12 (312) - iron stores low - likely little benefit for folate/b12 replacement at this time, hold off as well - hold off on iron for now given active malignancy  Prolonged QT interval - continue tele   Hypomagnesemia - Replete and recheck as needed  Hyperlipidemia associated with type 2 diabetes mellitus ([place]) - Continue Lipitor for now in setting of recent stenting but mortality benefit is questionable at this point  Sleep apnea - continue CPAP qhs  Complicated UTI (urinary tract infection) - see sepsis  PAF (paroxysmal atrial fibrillation) (HCC) - continue amio and Eliquis  - will need to hold Eliquis if any plans for surgical intervention   DM2 (diabetes mellitus, type 2) ([place]) - continue Lantus, SSI, and CBG monitoring - A1c = 8.1%  CAD (coronary artery disease) - s/p recent PCI to RCA in Jan 2022 - continue statin, plavix, eliquis    Old records reviewed in assessment of this patient  Antimicrobials: Cefepime 05/23/2021 >> current  DVT prophylaxis: SCDs Start: 05/23/21 2140 apixaban (ELIQUIS) tablet 5 mg   Code Status:   Code Status: Full Code Family Communication:   Disposition Plan: Status is: Inpatient  Remains inpatient appropriate because:IV treatments appropriate due to intensity of illness or inability to take PO and Inpatient level of care appropriate due to severity of illness  Dispo: The patient is from: Home              Anticipated d/c is to: Home              Patient currently is not medically stable to d/c.   Difficult to place  patient No Risk of unplanned readmission score: Unplanned Admission- Pilot do not use: 32.55   Objective: Blood pressure (!) 146/69, pulse 71, temperature 98.2 F (36.8 C), resp. rate 16, height '5\' 10"'  (1.778 m), weight 116.8 kg, SpO2 96 %.  Examination: General appearance: alert, cooperative, and no distress Head: Normocephalic, without obvious abnormality, atraumatic Eyes:  EOMI Lungs: clear to auscultation bilaterally Heart: regular rate and rhythm and S1, S2 normal Back: No CVA tenderness Abdomen: normal findings: bowel sounds normal and soft, non-tender Extremities:  No edema Skin: mobility and turgor normal Neurologic: Grossly normal  Consultants:  Urology Oncology  Procedures:    Data Reviewed: I have personally reviewed following labs and imaging studies Results for orders placed or performed during the hospital encounter of 05/23/21 (from the past 24 hour(s))  Glucose, capillary     Status: Abnormal   Collection Time: 05/25/21  4:44 PM  Result Value Ref Range   Glucose-Capillary 228 (H) 70 - 99 mg/dL  Glucose, capillary     Status: Abnormal   Collection Time: 05/25/21  8:29 PM  Result Value Ref Range   Glucose-Capillary 192 (H) 70 - 99 mg/dL  Glucose, capillary     Status: Abnormal   Collection Time: 05/26/21 12:03 AM  Result Value Ref Range   Glucose-Capillary 181 (H)  70 - 99 mg/dL  Basic metabolic panel     Status: Abnormal   Collection Time: 05/26/21  3:34 AM  Result Value Ref Range   Sodium 140 135 - 145 mmol/L   Potassium 3.5 3.5 - 5.1 mmol/L   Chloride 109 98 - 111 mmol/L   CO2 25 22 - 32 mmol/L   Glucose, Bld 137 (H) 70 - 99 mg/dL   BUN 26 (H) 8 - 23 mg/dL   Creatinine, Ser 1.61 (H) 0.61 - 1.24 mg/dL   Calcium 8.2 (L) 8.9 - 10.3 mg/dL   GFR, Estimated 45 (L) >60 mL/min   Anion gap 6 5 - 15  CBC with Differential/Platelet     Status: Abnormal   Collection Time: 05/26/21  3:34 AM  Result Value Ref Range   WBC 2.7 (L) 4.0 - 10.5 K/uL   RBC 3.27 (L)  4.22 - 5.81 MIL/uL   Hemoglobin 8.5 (L) 13.0 - 17.0 g/dL   HCT 27.2 (L) 39.0 - 52.0 %   MCV 83.2 80.0 - 100.0 fL   MCH 26.0 26.0 - 34.0 pg   MCHC 31.3 30.0 - 36.0 g/dL   RDW 16.4 (H) 11.5 - 15.5 %   Platelets 147 (L) 150 - 400 K/uL   nRBC 0.0 0.0 - 0.2 %   Neutrophils Relative % 17 %   Neutro Abs 0.5 (L) 1.7 - 7.7 K/uL   Lymphocytes Relative 47 %   Lymphs Abs 1.3 0.7 - 4.0 K/uL   Monocytes Relative 26 %   Monocytes Absolute 0.7 0.1 - 1.0 K/uL   Eosinophils Relative 9 %   Eosinophils Absolute 0.3 0.0 - 0.5 K/uL   Basophils Relative 1 %   Basophils Absolute 0.0 0.0 - 0.1 K/uL   Immature Granulocytes 0 %   Abs Immature Granulocytes 0.01 0.00 - 0.07 K/uL   Schistocytes PRESENT    Ovalocytes PRESENT   Magnesium     Status: None   Collection Time: 05/26/21  3:34 AM  Result Value Ref Range   Magnesium 1.8 1.7 - 2.4 mg/dL  Glucose, capillary     Status: Abnormal   Collection Time: 05/26/21  4:33 AM  Result Value Ref Range   Glucose-Capillary 152 (H) 70 - 99 mg/dL  Glucose, capillary     Status: Abnormal   Collection Time: 05/26/21  7:41 AM  Result Value Ref Range   Glucose-Capillary 120 (H) 70 - 99 mg/dL  Glucose, capillary     Status: Abnormal   Collection Time: 05/26/21 11:03 AM  Result Value Ref Range   Glucose-Capillary 245 (H) 70 - 99 mg/dL    Recent Results (from the past 240 hour(s))  Culture, Urine     Status: Abnormal (Preliminary result)   Collection Time: 05/23/21  3:40 PM   Specimen: Urine, Random  Result Value Ref Range Status   Specimen Description   Final    URINE, RANDOM Performed at [facility], [place] [address], [place], [place] [address]    Special Requests   Final    NONE Performed at [facility], [place] [address], [place], [place] [address]    Culture (A)  Final    >=100,000 COLONIES/mL PSEUDOMONAS AERUGINOSA 80,000 COLONIES/mL ENTEROCOCCUS FAECALIS    Report Status PENDING  Incomplete  Resp Panel by RT-PCR  (Flu A&B, Covid) Urine, Clean Catch     Status: None   Collection Time: 05/23/21  3:41 PM   Specimen: Urine, Clean Catch; Nasopharyngeal(NP) swabs in vial transport  medium  Result Value Ref Range Status   SARS Coronavirus 2 by RT PCR NEGATIVE NEGATIVE Final    Comment: (NOTE) SARS-CoV-2 target nucleic acids are NOT DETECTED.  The SARS-CoV-2 RNA is generally detectable in upper respiratory specimens during the acute phase of infection. The lowest concentration of SARS-CoV-2 viral copies this assay can detect is 138 copies/mL. A negative result does not preclude SARS-Cov-2 infection and should not be used as the sole basis for treatment or other patient management decisions. A negative result may occur with  improper specimen collection/handling, submission of specimen other than nasopharyngeal swab, presence of viral mutation(s) within the areas targeted by this assay, and inadequate number of viral copies(<138 copies/mL). A negative result must be combined with clinical observations, patient history, and epidemiological information. The expected result is Negative.  Fact Sheet for Patients:  EntrepreneurPulse.com.au  Fact Sheet for Healthcare Providers:  IncredibleEmployment.be  This test is no t yet approved or cleared by the [place] FDA and  has been authorized for detection and/or diagnosis of SARS-CoV-2 by FDA under an Emergency Use Authorization (EUA). This EUA will remain  in effect (meaning this test can be used) for the duration of the COVID-19 declaration under Section 564(b)(1) of the Act, 21 U.S.C.section 360bbb-3(b)(1), unless the authorization is terminated  or revoked sooner.       Influenza A by PCR NEGATIVE NEGATIVE Final   Influenza B by PCR NEGATIVE NEGATIVE Final    Comment: (NOTE) The Xpert Xpress SARS-CoV-2/FLU/RSV plus assay is intended as an aid in the diagnosis of influenza from Nasopharyngeal swab specimens  and should not be used as a sole basis for treatment. Nasal washings and aspirates are unacceptable for Xpert Xpress SARS-CoV-2/FLU/RSV testing.  Fact Sheet for Patients: EntrepreneurPulse.com.au  Fact Sheet for Healthcare Providers: IncredibleEmployment.be  This test is not yet approved or cleared by the [place] FDA and has been authorized for detection and/or diagnosis of SARS-CoV-2 by FDA under an Emergency Use Authorization (EUA). This EUA will remain in effect (meaning this test can be used) for the duration of the COVID-19 declaration under Section 564(b)(1) of the Act, 21 U.S.C. section 360bbb-3(b)(1), unless the authorization is terminated or revoked.  Performed at [facility], [place] [address] [place], [place] [address]   Culture, blood (x 2)     Status: Abnormal   Collection Time: 05/23/21  3:43 PM   Specimen: BLOOD  Result Value Ref Range Status   Specimen Description   Final    BLOOD RIGHT ANTECUBITAL Performed at [facility], [place] [address], [place], [place] [address]    Special Requests   Final    BOTTLES DRAWN AEROBIC AND ANAEROBIC Blood Culture adequate volume Performed at [place] [address], [place], [place] [address]    Culture  Setup Time   Final    GRAM NEGATIVE RODS IN BOTH AEROBIC AND ANAEROBIC BOTTLES CRITICAL RESULT CALLED TO, READ BACK BY AND VERIFIED WITH: PHARM D C.SHADE ON [id number] AT 1703 BY E.PARRISH Performed at [facility], [place] [address] [place], [place] [address]    Culture PSEUDOMONAS AERUGINOSA (A)  Final   Report Status 05/26/2021 FINAL  Final   Organism ID, Bacteria PSEUDOMONAS AERUGINOSA  Final      Susceptibility   Pseudomonas aeruginosa - MIC*    CEFTAZIDIME 4 SENSITIVE Sensitive     CIPROFLOXACIN <=0.25 SENSITIVE Sensitive     GENTAMICIN <=1 SENSITIVE Sensitive     IMIPENEM  2 SENSITIVE Sensitive     PIP/TAZO 8 SENSITIVE  Sensitive     CEFEPIME 2 SENSITIVE Sensitive     * PSEUDOMONAS AERUGINOSA  Blood Culture ID Panel (Reflexed)     Status: Abnormal   Collection Time: 05/23/21  3:43 PM  Result Value Ref Range Status   Enterococcus faecalis NOT DETECTED NOT DETECTED Final   Enterococcus Faecium NOT DETECTED NOT DETECTED Final   Listeria monocytogenes NOT DETECTED NOT DETECTED Final   Staphylococcus species NOT DETECTED NOT DETECTED Final   Staphylococcus aureus (BCID) NOT DETECTED NOT DETECTED Final   Staphylococcus epidermidis NOT DETECTED NOT DETECTED Final   Staphylococcus lugdunensis NOT DETECTED NOT DETECTED Final   Streptococcus species NOT DETECTED NOT DETECTED Final   Streptococcus agalactiae NOT DETECTED NOT DETECTED Final   Streptococcus pneumoniae NOT DETECTED NOT DETECTED Final   Streptococcus pyogenes NOT DETECTED NOT DETECTED Final   A.calcoaceticus-baumannii NOT DETECTED NOT DETECTED Final   Bacteroides fragilis NOT DETECTED NOT DETECTED Final   Enterobacterales NOT DETECTED NOT DETECTED Final   Enterobacter cloacae complex NOT DETECTED NOT DETECTED Final   Escherichia coli NOT DETECTED NOT DETECTED Final   Klebsiella aerogenes NOT DETECTED NOT DETECTED Final   Klebsiella oxytoca NOT DETECTED NOT DETECTED Final   Klebsiella pneumoniae NOT DETECTED NOT DETECTED Final   Proteus species NOT DETECTED NOT DETECTED Final   Salmonella species NOT DETECTED NOT DETECTED Final   Serratia marcescens NOT DETECTED NOT DETECTED Final   Haemophilus influenzae NOT DETECTED NOT DETECTED Final   Neisseria meningitidis NOT DETECTED NOT DETECTED Final   Pseudomonas aeruginosa DETECTED (A) NOT DETECTED Final    Comment: CRITICAL RESULT CALLED TO, READ BACK BY AND VERIFIED WITH: PHARM D C.SHADE ON [id number] AT 1703 BY E.PARRISH    Stenotrophomonas maltophilia NOT DETECTED NOT DETECTED Final   Candida albicans NOT DETECTED NOT DETECTED Final   Candida auris NOT DETECTED NOT DETECTED Final   Candida glabrata  NOT DETECTED NOT DETECTED Final   Candida krusei NOT DETECTED NOT DETECTED Final   Candida parapsilosis NOT DETECTED NOT DETECTED Final   Candida tropicalis NOT DETECTED NOT DETECTED Final   Cryptococcus neoformans/gattii NOT DETECTED NOT DETECTED Final   CTX-M ESBL NOT DETECTED NOT DETECTED Final   Carbapenem resistance IMP NOT DETECTED NOT DETECTED Final   Carbapenem resistance KPC NOT DETECTED NOT DETECTED Final   Carbapenem resistance NDM NOT DETECTED NOT DETECTED Final   Carbapenem resistance VIM NOT DETECTED NOT DETECTED Final    Comment: Performed at [facility], [place] [address] [place], [place] [address]  Culture, blood (x 2)     Status: None (Preliminary result)   Collection Time: 05/23/21  3:45 PM   Specimen: BLOOD LEFT ARM  Result Value Ref Range Status   Specimen Description   Final    BLOOD LEFT ARM Performed at [place] [address] [place], [place] [address]    Special Requests   Final    BOTTLES DRAWN AEROBIC AND ANAEROBIC Blood Culture adequate volume Performed at [place] [address] [place], [place] [address]    Culture   Final    NO GROWTH 3 DAYS Performed at [facility], [place] [address], [place], [place] [address]    Report Status PENDING  Incomplete  MRSA Next Gen by PCR, Nasal     Status: None   Collection Time: 05/23/21  6:56 PM   Specimen: Nasal Mucosa; Nasal Swab  Result Value Ref  Range Status   MRSA by PCR Next Gen NOT DETECTED NOT DETECTED Final    Comment: (NOTE) The GeneXpert MRSA Assay (FDA approved for NASAL specimens only), is one component of a comprehensive MRSA colonization surveillance program. It is not intended to diagnose MRSA infection nor to guide or monitor treatment for MRSA infections. Test performance is not FDA approved in patients less than 88 years old. Performed at [facility], [place] [address] [place], [place] [address]   Culture, blood  (routine x 2)     Status: None (Preliminary result)   Collection Time: 05/25/21  9:08 AM   Specimen: BLOOD  Result Value Ref Range Status   Specimen Description   Final    BLOOD BLOOD RIGHT FOREARM Performed at [place] [address] [place], [place] [address]    Special Requests   Final    BOTTLES DRAWN AEROBIC AND ANAEROBIC Blood Culture adequate volume Performed at [place] [address], [place], [place] [address]    Culture   Final    NO GROWTH < 24 HOURS Performed at [place] [address], [place], [place] [address]    Report Status PENDING  Incomplete  Culture, blood (routine x 2)     Status: None (Preliminary result)   Collection Time: 05/25/21  9:08 AM   Specimen: BLOOD  Result Value Ref Range Status   Specimen Description   Final    BLOOD LEFT ANTECUBITAL Performed at [place] [address], [place], [place] [address]    Special Requests   Final    BOTTLES DRAWN AEROBIC AND ANAEROBIC Blood Culture results may not be optimal due to an inadequate volume of blood received in culture bottles Performed at [place] [address] [place], [place] [address]    Culture   Final    NO GROWTH < 24 HOURS Performed at [facility] [place] [address] [place], [place] [address]    Report Status PENDING  Incomplete     Radiology Studies: ECHOCARDIOGRAM COMPLETE  Result Date: 05/24/2021    ECHOCARDIOGRAM REPORT   Patient Name:   [name] Date of Exam: 05/24/2021 Medical Rec #:  [id number]        Height:       70.0 in Accession #:    [id number]       Weight:       252.3 lb Date of Birth:  08/30/1946         BSA:          2.304 m Patient Age:    54 years         BP:           149/73 mmHg Patient Gender: M                HR:           62 bpm. Exam Location:  Inpatient Procedure: 2D Echo, Cardiac Doppler and Color Doppler Indications:    Cardiomegaly  History:         Patient has no prior history of Echocardiogram examinations,                 most recent 05/13/2021. Angina and CAD, Arrythmias:Atrial                 Fibrillation; Risk Factors:Diabetes, Dyslipidemia and  Hypertension.  Sonographer:    Luisa Hart RDCS Referring Phys: [place]  Sonographer Comments: Patient is morbidly obese. Image acquisition challenging due to patient body habitus and Image acquisition challenging due to respiratory motion. IMPRESSIONS  1. No obvious regional wall motion abnormaliies are seen, but the apical views are difficult to evaluate. Left ventricular ejection fraction, by estimation, is 45 to 50%. The left ventricle has mildly decreased function. Left ventricular endocardial border not optimally defined to evaluate regional wall motion. The left ventricular internal cavity size was mildly dilated. Left ventricular diastolic parameters are consistent with Grade II diastolic dysfunction (pseudonormalization). Elevated left atrial pressure.  2. Right ventricular systolic function is normal. The right ventricular size is mildly enlarged. There is mildly elevated pulmonary artery systolic pressure. The estimated right ventricular systolic pressure is 67.6 mmHg.  3. Left atrial size was moderately dilated.  4. Right atrial size was mildly dilated.  5. The mitral valve is normal in structure. No evidence of mitral valve regurgitation.  6. The aortic valve is tricuspid. There is mild calcification of the aortic valve. There is mild thickening of the aortic valve. Aortic valve regurgitation is not visualized. Mild to moderate aortic valve sclerosis/calcification is present, without any evidence of aortic stenosis.  7. Aortic dilatation noted. There is mild dilatation of the aortic root, measuring 40 mm. FINDINGS  Left Ventricle: No obvious regional wall motion abnormaliies are seen, but the apical views are difficult to evaluate. Left ventricular ejection fraction, by  estimation, is 45 to 50%. The left ventricle has mildly decreased function. Left ventricular endocardial border not optimally defined to evaluate regional wall motion. The left ventricular internal cavity size was mildly dilated. There is no left ventricular hypertrophy. Left ventricular diastolic parameters are consistent with Grade II diastolic dysfunction (pseudonormalization). Elevated left atrial pressure. Right Ventricle: The right ventricular size is mildly enlarged. No increase in right ventricular wall thickness. Right ventricular systolic function is normal. There is mildly elevated pulmonary artery systolic pressure. The tricuspid regurgitant velocity is 2.89 m/s, and with an assumed right atrial pressure of 3 mmHg, the estimated right ventricular systolic pressure is 19.5 mmHg. Left Atrium: Left atrial size was moderately dilated. Right Atrium: Right atrial size was mildly dilated. Pericardium: There is no evidence of pericardial effusion. Mitral Valve: The mitral valve is normal in structure. Mild mitral annular calcification. No evidence of mitral valve regurgitation. Tricuspid Valve: The tricuspid valve is not well visualized. Tricuspid valve regurgitation is trivial. Aortic Valve: The aortic valve is tricuspid. There is mild calcification of the aortic valve. There is mild thickening of the aortic valve. Aortic valve regurgitation is not visualized. Mild to moderate aortic valve sclerosis/calcification is present, without any evidence of aortic stenosis. Aortic valve mean gradient measures 9.0 mmHg. Aortic valve peak gradient measures 16.2 mmHg. Aortic valve area, by VTI measures 2.84 cm. Pulmonic Valve: The pulmonic valve was grossly normal. Pulmonic valve regurgitation is not visualized. Aorta: Aortic dilatation noted. There is mild dilatation of the aortic root, measuring 40 mm. IAS/Shunts: No atrial level shunt detected by color flow Doppler.  LEFT VENTRICLE PLAX 2D LVIDd:         5.87 cm       Diastology LVIDs:         4.59 cm      LV e' medial:    5.87 cm/s LV PW:         0.80 cm      LV E/e' medial:  18.1 LV  IVS:        0.80 cm      LV e' lateral:   7.40 cm/s LVOT diam:     2.30 cm      LV E/e' lateral: 14.3 LV SV:         108 LV SV Index:   47 LVOT Area:     4.15 cm  LV Volumes (MOD) LV vol d, MOD A2C: 62.7 ml LV vol d, MOD A4C: 116.0 ml LV vol s, MOD A2C: 28.6 ml LV vol s, MOD A4C: 71.8 ml LV SV MOD A2C:     34.1 ml LV SV MOD A4C:     116.0 ml LV SV MOD BP:      44.3 ml RIGHT VENTRICLE RV Basal diam:  4.35 cm RV Mid diam:    2.80 cm RV S prime:     17.40 cm/s TAPSE (M-mode): 2.7 cm LEFT ATRIUM             Index       RIGHT ATRIUM           Index LA diam:        4.50 cm 1.95 cm/m  RA Area:     20.70 cm LA Vol (A2C):   37.4 ml 16.23 ml/m RA Volume:   62.00 ml  26.91 ml/m LA Vol (A4C):   57.0 ml 24.74 ml/m LA Biplane Vol: 46.8 ml 20.31 ml/m  AORTIC VALVE                    PULMONIC VALVE AV Area (Vmax):    3.06 cm     PV Vmax:       1.08 m/s AV Area (Vmean):   3.09 cm     PV Vmean:      78.400 cm/s AV Area (VTI):     2.84 cm     PV VTI:        0.234 m AV Vmax:           201.00 cm/s  PV Peak grad:  4.7 mmHg AV Vmean:          137.000 cm/s PV Mean grad:  3.0 mmHg AV VTI:            0.379 m AV Peak Grad:      16.2 mmHg AV Mean Grad:      9.0 mmHg LVOT Vmax:         148.00 cm/s LVOT Vmean:        102.000 cm/s LVOT VTI:          0.259 m LVOT/AV VTI ratio: 0.68  AORTA Ao Root diam: 4.00 cm Ao Asc diam:  3.50 cm MITRAL VALVE                TRICUSPID VALVE MV Area (PHT): 3.74 cm     TR Peak grad:   33.4 mmHg MV Decel Time: 203 msec     TR Vmax:        289.00 cm/s MV E velocity: 106.00 cm/s MV A velocity: 45.40 cm/s   SHUNTS MV E/A ratio:  2.33         Systemic VTI:  0.26 m                             Systemic Diam: 2.30 cm Dani Gobble Croitoru MD Electronically signed by Sanda Klein MD Signature Date/Time: 05/24/2021/2:39:41 PM    Final  CT Renal Stone Study  Final Result    DG Chest Port 1 View   Final Result      Scheduled Meds:  amiodarone  200 mg Oral q morning   apixaban  5 mg Oral BID   atorvastatin  20 mg Oral QHS   Chlorhexidine Gluconate Cloth  6 each Topical Daily   clopidogrel  75 mg Oral Daily   finasteride  5 mg Oral QHS   insulin aspart  0-9 Units Subcutaneous Q4H   insulin glargine  25 Units Subcutaneous QHS   metoprolol tartrate  50 mg Oral BID   tamsulosin  0.4 mg Oral QPC supper   PRN Meds: acetaminophen **OR** acetaminophen, HYDROcodone-acetaminophen Continuous Infusions:  ceFEPime (MAXIPIME) IV 2 g (05/26/21 0845)     LOS: 3 days  Time spent: Greater than 50% of the 35 minute visit was spent in counseling/coordination of care for the patient as laid out in the A&P.   Dwyane Dee, MD [facility] 05/26/2021, 1:35 PM

## 2021-05-26 NOTE — H&P (View-Only) (Signed)
    [facility] has been requested to perform a transesophageal echocardiogram on this patient for bacteremia. The patient's nurse, Latrice, helped utilize the tablet's [place] interpreter with myself on speakerphone. Remote consent performed due to patient's severe neutropenia to limit unnecessary face-to-face exposure.  After careful review of history and examination, the risks and benefits of transesophageal echocardiogram have been explained including risks of esophageal damage, perforation (1:10,000 risk), bleeding, pharyngeal hematoma as well as other potential complications associated with conscious sedation including aspiration, arrhythmia, respiratory failure and death. Alternatives to treatment were discussed, questions were answered. Patient is willing to proceed with procedure but he does express concern about the timing. He is upset that the procedure is not until Friday and repeated several times that he hopes we understand that he cannot sit here for 3 days and requests the procedure be moved up. Unfortunately per our scheduler, that is not possible and 7/15 is the next available spot. I explained to the patient that Friday is the first available day but he was unhappy with this. He said that we see our time as valuable but his is not and he would like to discuss why he is not being discharged. Since cardiology is otherwise not involved in his care except for provision of TEE, I told him I would notify his IM attending to discuss plan of care. He is agreeable to leave TEE on the schedule for Friday so will write orders. I messaged Dr. Sabino Gasser with this update and also requested his attention to adjustment of insulin the night before procedure given that he will need to be NPO after midnight going into Friday. Please notify cardiology with any updates if the patient leaves the hospital before procedure. Procedure is scheduled at 10:30am with Dr. Julieanne Manson on 7/15. Thank you.  Charlie Pitter,  PA-C 05/26/2021 2:14 PM

## 2021-05-26 NOTE — Plan of Care (Signed)

## 2021-05-26 NOTE — Progress Notes (Signed)
Pharmacy Antibiotic Note  [name] is a 75 y.o. male admitted on 05/23/2021 with sepsis and UTI.  Pharmacy has been consulted for Cefepime dosing.  05/26/21 12:34 PM  SCr stable afebrile  Plan: Continue cefepime 2g IV q12h Plan 2 weeks of IV cefepime from negative culture per ID Will sign off and follow remotely.  Thank you for allowing pharmacy to participate in this patient's care.    Height: 5\' 10"  (177.8 cm) Weight: 116.8 kg (257 lb 8 oz) IBW/kg (Calculated) : 73  Temp (24hrs), Avg:98.2 F (36.8 C), Min:97.7 F (36.5 C), Max:98.6 F (37 C)  Recent Labs  Lab 05/23/21 1540 05/24/21 0324 05/25/21 0338 05/26/21 0334  WBC 5.2 3.7* 2.8* 2.7*  CREATININE 1.73* 1.69* 1.63* 1.61*  LATICACIDVEN 1.4  --   --   --      Estimated Creatinine Clearance: 52.3 mL/min (A) (by C-G formula based on SCr of 1.61 mg/dL (H)).    No Known Allergies    Thank you for allowing pharmacy to be a part of this patient's care.  Napoleon Form  05/26/2021 12:33 PM

## 2021-05-26 NOTE — Progress Notes (Addendum)
    [facility] has been requested to perform a transesophageal echocardiogram on this patient for bacteremia. The patient's nurse, Latrice, helped utilize the tablet's [place] interpreter with myself on speakerphone. Remote consent performed due to patient's severe neutropenia to limit unnecessary face-to-face exposure.  After careful review of history and examination, the risks and benefits of transesophageal echocardiogram have been explained including risks of esophageal damage, perforation (1:10,000 risk), bleeding, pharyngeal hematoma as well as other potential complications associated with conscious sedation including aspiration, arrhythmia, respiratory failure and death. Alternatives to treatment were discussed, questions were answered. Patient is willing to proceed with procedure but he does express concern about the timing. He is upset that the procedure is not until Friday and repeated several times that he hopes we understand that he cannot sit here for 3 days and requests the procedure be moved up. Unfortunately per our scheduler, that is not possible and 7/15 is the next available spot. I explained to the patient that Friday is the first available day but he was unhappy with this. He said that we see our time as valuable but his is not and he would like to discuss why he is not being discharged. Since cardiology is otherwise not involved in his care except for provision of TEE, I told him I would notify his IM attending to discuss plan of care. He is agreeable to leave TEE on the schedule for Friday so will write orders. I messaged Dr. Sabino Gasser with this update and also requested his attention to adjustment of insulin the night before procedure given that he will need to be NPO after midnight going into Friday. Please notify cardiology with any updates if the patient leaves the hospital before procedure. Procedure is scheduled at 10:30am with Dr. Julieanne Manson on 7/15. Thank you.  Charlie Pitter,  PA-C 05/26/2021 2:14 PM

## 2021-05-27 ENCOUNTER — Other Ambulatory Visit: Payer: Self-pay | Admitting: Radiology

## 2021-05-27 ENCOUNTER — Ambulatory Visit: Payer: Medicare HMO | Admitting: Hematology and Oncology

## 2021-05-27 ENCOUNTER — Ambulatory Visit: Payer: Medicare HMO

## 2021-05-27 ENCOUNTER — Other Ambulatory Visit: Payer: Medicare HMO

## 2021-05-27 LAB — CBC WITH DIFFERENTIAL/PLATELET
Abs Immature Granulocytes: 0.03 10*3/uL (ref 0.00–0.07)
Basophils Absolute: 0 10*3/uL (ref 0.0–0.1)
Basophils Relative: 1 %
Eosinophils Absolute: 0.2 10*3/uL (ref 0.0–0.5)
Eosinophils Relative: 8 %
HCT: 27.7 % — ABNORMAL LOW (ref 39.0–52.0)
Hemoglobin: 8.6 g/dL — ABNORMAL LOW (ref 13.0–17.0)
Immature Granulocytes: 1 %
Lymphocytes Relative: 59 %
Lymphs Abs: 1.7 10*3/uL (ref 0.7–4.0)
MCH: 25.9 pg — ABNORMAL LOW (ref 26.0–34.0)
MCHC: 31 g/dL (ref 30.0–36.0)
MCV: 83.4 fL (ref 80.0–100.0)
Monocytes Absolute: 0.7 10*3/uL (ref 0.1–1.0)
Monocytes Relative: 25 %
Neutro Abs: 0.2 10*3/uL — CL (ref 1.7–7.7)
Neutrophils Relative %: 6 %
Platelets: 187 10*3/uL (ref 150–400)
RBC: 3.32 MIL/uL — ABNORMAL LOW (ref 4.22–5.81)
RDW: 16.1 % — ABNORMAL HIGH (ref 11.5–15.5)
WBC: 3 10*3/uL — ABNORMAL LOW (ref 4.0–10.5)
nRBC: 0 % (ref 0.0–0.2)

## 2021-05-27 LAB — GLUCOSE, CAPILLARY
Glucose-Capillary: 117 mg/dL — ABNORMAL HIGH (ref 70–99)
Glucose-Capillary: 124 mg/dL — ABNORMAL HIGH (ref 70–99)
Glucose-Capillary: 193 mg/dL — ABNORMAL HIGH (ref 70–99)
Glucose-Capillary: 226 mg/dL — ABNORMAL HIGH (ref 70–99)
Glucose-Capillary: 250 mg/dL — ABNORMAL HIGH (ref 70–99)
Glucose-Capillary: 278 mg/dL — ABNORMAL HIGH (ref 70–99)

## 2021-05-27 LAB — BASIC METABOLIC PANEL
Anion gap: 5 (ref 5–15)
BUN: 21 mg/dL (ref 8–23)
CO2: 27 mmol/L (ref 22–32)
Calcium: 8.1 mg/dL — ABNORMAL LOW (ref 8.9–10.3)
Chloride: 110 mmol/L (ref 98–111)
Creatinine, Ser: 1.58 mg/dL — ABNORMAL HIGH (ref 0.61–1.24)
GFR, Estimated: 46 mL/min — ABNORMAL LOW (ref >60)
Glucose, Bld: 116 mg/dL — ABNORMAL HIGH (ref 70–99)
Potassium: 3.4 mmol/L — ABNORMAL LOW (ref 3.5–5.1)
Sodium: 142 mmol/L (ref 135–145)

## 2021-05-27 LAB — URINE CULTURE: Culture: >=100000 — AB

## 2021-05-27 LAB — MAGNESIUM: Magnesium: 1.9 mg/dL (ref 1.7–2.4)

## 2021-05-27 MED ORDER — PIPERACILLIN-TAZOBACTAM 3.375 G IVPB
3.3750 g | Freq: Three times a day (TID) | INTRAVENOUS | Status: DC
  Administered 2021-05-27 – 2021-06-01 (×16): 3.375 g via INTRAVENOUS
  Filled 2021-05-27 (×16): qty 50

## 2021-05-27 NOTE — Progress Notes (Signed)
Pt declined nocturnal cpap tonight.  RN aware.

## 2021-05-27 NOTE — Progress Notes (Signed)
[facility] for Infect[facility]    Date of Admission:  05/23/2021   Total days of antibiotics 5/day 1 piptazo           ID: [name] is a 76 y.o. male with pseudomonal sepsis from complicated UTI Active Problems:   Hydronephrosis of right kidney   CAD (coronary artery disease)   DM2 (diabetes mellitus, type 2) (HCC)   PAF (paroxysmal atrial fibrillation) (HCC)   Complicated UTI (urinary tract infection)   Sleep apnea   Hyperlipidemia associated with type 2 diabetes mellitus (HCC)   Hypomagnesemia   Acute renal failure superimposed on stage 3a chronic kidney disease (HCC)   Prolonged QT interval   DLBCL (diffuse large B cell lymphoma) (HCC)   Normocytic anemia   BPH (benign prostatic hyperplasia)   Bacteremia due to Gram-negative bacteria   Severe neutropenia (HCC)    Subjective: Afebrile, feeling slightly better, improved appetite. No fevers.  Medications:   amiodarone  200 mg Oral q morning   apixaban  5 mg Oral BID   atorvastatin  20 mg Oral QHS   Chlorhexidine Gluconate Cloth  6 each Topical Daily   clopidogrel  75 mg Oral Daily   finasteride  5 mg Oral QHS   insulin aspart  0-9 Units Subcutaneous Q4H   [START ON 05/28/2021] insulin glargine  12 Units Subcutaneous Once   insulin glargine  25 Units Subcutaneous QHS   metoprolol tartrate  50 mg Oral BID   tamsulosin  0.4 mg Oral QPC supper    Objective: Vital signs in last 24 hours: Temp:  [98 F (36.7 C)-98.2 F (36.8 C)] 98.2 F (36.8 C) (07/13 1202) Pulse Rate:  [60-68] 61 (07/13 1202) Resp:  [14-21] 21 (07/13 1508) BP: (122-156)/(73-81) 144/73 (07/13 1202) SpO2:  [99 %-100 %] 99 % (07/13 1508) Physical Exam  Constitutional: He is oriented to person, place, and time. He appears well-developed and well-nourished. No distress.  HENT:  Mouth/Throat: Oropharynx is clear and moist. No oropharyngeal exudate.  Cardiovascular: Normal rate, regular rhythm and normal heart sounds. Exam reveals no  gallop and no friction rub.  No murmur heard.  Pulmonary/Chest: Effort normal and breath sounds normal. No respiratory distress. He has no wheezes.  Abdominal: Soft. Bowel sounds are normal. He exhibits no distension. There is no tenderness.  Lymphadenopathy:  He has no cervical adenopathy.  Neurological: He is alert and oriented to person, place, and time.  Skin: Skin is warm and dry. No rash noted. No erythema.  Psychiatric: He has a normal mood and affect. His behavior is normal.    Lab Results Recent Labs    05/26/21 0334 05/27/21 0338  WBC 2.7* 3.0*  HGB 8.5* 8.6*  HCT 27.2* 27.7*  NA 140 142  K 3.5 3.4*  CL 109 110  CO2 25 27  BUN 26* 21  CREATININE 1.61* 1.58*     Microbiology: Urine cx: PsA and enterococcus Blood cx: PsA Studies/Results: No results found.   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  [facility]  Cell: [phone] Pager: [phone]  05/27/2021, 5:53 PM

## 2021-05-27 NOTE — Progress Notes (Signed)
[name]  WJX:[id number] DOB: 1945-12-23 DOA: 05/23/2021 PCP: [name], MD    Brief Narrative:  74 year old with a recent history of DLBCL (ABC type), CAD status post PCI with DES to RCA January 2022, chronic paroxysmal atrial fibrillation on Eliquis, CKD stage III AAA, DM2, HTN, and OSA who presented to the hospital with recurrent fever.  He underwent a right ureteral stent placement 6/17 due to significant tumor burden related to retroperitoneal pelvic adenopathy.  At the time of this admission he was felt to be septic due to a UTI.   Consultants:  Infectious Disease Urology Oncology  Code Status: FULL CODE  Antimicrobials:  Cefepime 7/9 >  DVT prophylaxis: Eliquis  Subjective: I spoke to the patient at length using the iPad interpreter.  He reports no new complaints.  He is anxious to get his procedures completed and get out of the hospital soon as possible.  He denies chest pain nausea vomiting or abdominal pain.  Assessment & Plan:  Complicated UTI with sepsis - Pseudomonas bacteremia CT renal study noted extensive perinephric, periureteral, and perivesicular stranding - Pseudomonas noted in urine culture - suspicion is that of incomplete clearance of recent UTI due to neutropenic state -now cleared for long-term IV to facilitate antibiotic therapy (will ask Onc about port v/s PICC as appears he is due for a port-a-cath)  Severe neutropenia WBC stable  Diffuse large B-cell lymphoma Diagnosed 05/04/2021 via right retroperitoneal lymph node biopsy -followed by Dr. Lorenso Courier with staging still in process -oncology following as inpatient - now ready for port v/s PICC to facilitate abx as well as chemo  Acute renal failure on CKD stage IIIa Baseline creatinine approximately 1.4 -acute worsening felt to be due to mass-effect related to retroperitoneal pelvic adenopathy - now improved/stable   Recent Labs  Lab 05/23/21 1540 05/24/21 0324 05/25/21 0338  05/26/21 0334 05/27/21 0338  CREATININE 1.73* 1.69* 1.63* 1.61* 1.58*     Normocytic anemia Due to cancer as well as CKD -hemoglobin stable at approximately 8.5  Prolonged QT interval  Hypomagnesemia Corrected with supplementation  Chronic paroxysmal atrial fibrillation Continue amiodarone and Eliquis  CAD Status post PCI to RCA January 2022 Continue statin, Plavix, Eliquis  DM2 CBG trending upward -gently adjust treatment  HLD  Sleep apnea Continue nightly CPAP per home regimen  Hydronephrosis of right kidney Status post right ureteral stent placement 6/17  BPH   Family Communication:  Status is: Inpatient  Remains inpatient appropriate because:Inpatient level of care appropriate due to severity of illness  Dispo: The patient is from: Home              Anticipated d/c is to: Home              Patient currently is not medically stable to d/c.   Difficult to place patient No   Objective: Blood pressure (!) 156/81, pulse 68, temperature 98.2 F (36.8 C), temperature source Oral, resp. rate 18, height 5\' 10"  (1.778 m), weight 116.8 kg, SpO2 100 %.  Intake/Output Summary (Last 24 hours) at 05/27/2021 0911 Last data filed at 05/27/2021 0758 Gross per 24 hour  Intake 100 ml  Output 1450 ml  Net -1350 ml   Filed Weights   05/26/21 0200  Weight: 116.8 kg    Examination: General: No acute respiratory distress Lungs: Clear to auscultation bilaterally without wheezes or crackles Cardiovascular: Regular rate and rhythm without murmur gallop or rub normal S1 and S2 Abdomen: Nontender, nondistended, soft, bowel sounds positive,  no rebound, no ascites, no appreciable mass Extremities: No significant cyanosis, clubbing, or edema bilateral lower extremities  CBC: Recent Labs  Lab 05/25/21 0338 05/26/21 0334 05/27/21 0338  WBC 2.8* 2.7* 3.0*  NEUTROABS 0.4* 0.5* 0.2*  HGB 8.7* 8.5* 8.6*  HCT 27.2* 27.2* 27.7*  MCV 82.2 83.2 83.4  PLT 130* 147* 235    Basic Metabolic Panel: Recent Labs  Lab 05/23/21 1901 05/24/21 0324 05/25/21 0338 05/26/21 0334 05/27/21 0338  NA  --  140 139 140 142  K  --  3.9 3.6 3.5 3.4*  CL  --  107 109 109 110  CO2  --  26 25 25 27   GLUCOSE  --  196* 167* 137* 116*  BUN  --  23 26* 26* 21  CREATININE  --  1.69* 1.63* 1.61* 1.58*  CALCIUM  --  8.6* 8.1* 8.2* 8.1*  MG 1.6* 2.0 2.0 1.8 1.9  PHOS 3.0 5.0*  --   --   --    GFR: Estimated Creatinine Clearance: 53.3 mL/min (A) (by C-G formula based on SCr of 1.58 mg/dL (H)).  Liver Function Tests: Recent Labs  Lab 05/23/21 1540 05/24/21 0324  AST 16 13*  ALT 23 19  ALKPHOS 94 76  BILITOT 1.4* 1.3*  PROT 6.1* 5.6*  ALBUMIN 3.6 3.0*     Coagulation Profile: Recent Labs  Lab 05/23/21 1540  INR 1.7*    Cardiac Enzymes: Recent Labs  Lab 05/23/21 1901  CKTOTAL 37*    HbA1C: Hgb A1c MFr Bld  Date/Time Value Ref Range Status  05/23/2021 03:40 PM 8.1 (H) 4.8 - 5.6 % Final    Comment:    (NOTE) Pre diabetes:          5.7%-6.4%  Diabetes:              >6.4%  Glycemic control for   <7.0% adults with diabetes   05/01/2021 05:15 AM 7.7 (H) 4.8 - 5.6 % Final    Comment:    (NOTE)         Prediabetes: 5.7 - 6.4         Diabetes: >6.4         Glycemic control for adults with diabetes: <7.0     CBG: Recent Labs  Lab 05/26/21 1601 05/26/21 1952 05/27/21 0033 05/27/21 0449 05/27/21 0735  GLUCAP 286* 222* 117* 124* 193*    Recent Results (from the past 240 hour(s))  Culture, Urine     Status: Abnormal   Collection Time: 05/23/21  3:40 PM   Specimen: Urine, Random  Result Value Ref Range Status   Specimen Description   Final    URINE, RANDOM Performed at [facility] [address], [place], [place] [address]    Special Requests   Final    NONE Performed at [facility], [place] [address] [place],  [address]    Culture (A)  Final    >=100,000 COLONIES/mL PSEUDOMONAS  AERUGINOSA 80,000 COLONIES/mL ENTEROCOCCUS FAECALIS    Report Status 05/27/2021 FINAL  Final   Organism ID, Bacteria PSEUDOMONAS AERUGINOSA (A)  Final   Organism ID, Bacteria ENTEROCOCCUS FAECALIS (A)  Final      Susceptibility   Enterococcus faecalis - MIC*    AMPICILLIN <=2 SENSITIVE Sensitive     NITROFURANTOIN <=16 SENSITIVE Sensitive     VANCOMYCIN 1 SENSITIVE Sensitive     * 80,000 COLONIES/mL ENTEROCOCCUS FAECALIS   Pseudomonas aeruginosa - MIC*    CEFTAZIDIME 4  SENSITIVE Sensitive     CIPROFLOXACIN <=0.25 SENSITIVE Sensitive     GENTAMICIN <=1 SENSITIVE Sensitive     IMIPENEM 2 SENSITIVE Sensitive     PIP/TAZO 8 SENSITIVE Sensitive     CEFEPIME 2 SENSITIVE Sensitive     * >=100,000 COLONIES/mL PSEUDOMONAS AERUGINOSA  Resp Panel by RT-PCR (Flu A&B, Covid) Urine, Clean Catch     Status: None   Collection Time: 05/23/21  3:41 PM   Specimen: Urine, Clean Catch; Nasopharyngeal(NP) swabs in vial transport medium  Result Value Ref Range Status   SARS Coronavirus 2 by RT PCR NEGATIVE NEGATIVE Final    Comment: (NOTE) SARS-CoV-2 target nucleic acids are NOT DETECTED.  The SARS-CoV-2 RNA is generally detectable in upper respiratory specimens during the acute phase of infection. The lowest concentration of SARS-CoV-2 viral copies this assay can detect is 138 copies/mL. A negative result does not preclude SARS-Cov-2 infection and should not be used as the sole basis for treatment or other patient management decisions. A negative result may occur with  improper specimen collection/handling, submission of specimen other than nasopharyngeal swab, presence of viral mutation(s) within the areas targeted by this assay, and inadequate number of viral copies(<138 copies/mL). A negative result must be combined with clinical observations, patient history, and epidemiological information. The expected result is Negative.  Fact Sheet for Patients:   EntrepreneurPulse.com.au  Fact Sheet for Healthcare Providers:  IncredibleEmployment.be  This test is no t yet approved or cleared by the [place] FDA and  has been authorized for detection and/or diagnosis of SARS-CoV-2 by FDA under an Emergency Use Authorization (EUA). This EUA will remain  in effect (meaning this test can be used) for the duration of the COVID-19 declaration under Section 564(b)(1) of the Act, 21 U.S.C.section 360bbb-3(b)(1), unless the authorization is terminated  or revoked sooner.       Influenza A by PCR NEGATIVE NEGATIVE Final   Influenza B by PCR NEGATIVE NEGATIVE Final    Comment: (NOTE) The Xpert Xpress SARS-CoV-2/FLU/RSV plus assay is intended as an aid in the diagnosis of influenza from Nasopharyngeal swab specimens and should not be used as a sole basis for treatment. Nasal washings and aspirates are unacceptable for Xpert Xpress SARS-CoV-2/FLU/RSV testing.  Fact Sheet for Patients: EntrepreneurPulse.com.au  Fact Sheet for Healthcare Providers: IncredibleEmployment.be  This test is not yet approved or cleared by the [place] FDA and has been authorized for detection and/or diagnosis of SARS-CoV-2 by FDA under an Emergency Use Authorization (EUA). This EUA will remain in effect (meaning this test can be used) for the duration of the COVID-19 declaration under Section 564(b)(1) of the Act, 21 U.S.C. section 360bbb-3(b)(1), unless the authorization is terminated or revoked.  Performed at [facility], [place] [address] [place], [place] [address]   Culture, blood (x 2)     Status: Abnormal   Collection Time: 05/23/21  3:43 PM   Specimen: BLOOD  Result Value Ref Range Status   Specimen Description   Final    BLOOD RIGHT ANTECUBITAL Performed at [facility], [place] [address], [place], [place] [address]    Special Requests   Final    BOTTLES  DRAWN AEROBIC AND ANAEROBIC Blood Culture adequate volume Performed at [facility] [address] [place], [place] [address]    Culture  Setup Time   Final    GRAM NEGATIVE RODS IN BOTH AEROBIC AND ANAEROBIC BOTTLES CRITICAL RESULT CALLED TO, READ BACK BY AND VERIFIED WITH:  PHARM D C.SHADE ON [id number] AT 1703 BY E.PARRISH Performed at [facility], [place] [address], [place], [place] [address]    Culture PSEUDOMONAS AERUGINOSA (A)  Final   Report Status 05/26/2021 FINAL  Final   Organism ID, Bacteria PSEUDOMONAS AERUGINOSA  Final      Susceptibility   Pseudomonas aeruginosa - MIC*    CEFTAZIDIME 4 SENSITIVE Sensitive     CIPROFLOXACIN <=0.25 SENSITIVE Sensitive     GENTAMICIN <=1 SENSITIVE Sensitive     IMIPENEM 2 SENSITIVE Sensitive     PIP/TAZO 8 SENSITIVE Sensitive     CEFEPIME 2 SENSITIVE Sensitive     * PSEUDOMONAS AERUGINOSA  Blood Culture ID Panel (Reflexed)     Status: Abnormal   Collection Time: 05/23/21  3:43 PM  Result Value Ref Range Status   Enterococcus faecalis NOT DETECTED NOT DETECTED Final   Enterococcus Faecium NOT DETECTED NOT DETECTED Final   Listeria monocytogenes NOT DETECTED NOT DETECTED Final   Staphylococcus species NOT DETECTED NOT DETECTED Final   Staphylococcus aureus (BCID) NOT DETECTED NOT DETECTED Final   Staphylococcus epidermidis NOT DETECTED NOT DETECTED Final   Staphylococcus lugdunensis NOT DETECTED NOT DETECTED Final   Streptococcus species NOT DETECTED NOT DETECTED Final   Streptococcus agalactiae NOT DETECTED NOT DETECTED Final   Streptococcus pneumoniae NOT DETECTED NOT DETECTED Final   Streptococcus pyogenes NOT DETECTED NOT DETECTED Final   A.calcoaceticus-baumannii NOT DETECTED NOT DETECTED Final   Bacteroides fragilis NOT DETECTED NOT DETECTED Final   Enterobacterales NOT DETECTED NOT DETECTED Final   Enterobacter cloacae complex NOT DETECTED NOT DETECTED Final   Escherichia coli NOT DETECTED NOT DETECTED  Final   Klebsiella aerogenes NOT DETECTED NOT DETECTED Final   Klebsiella oxytoca NOT DETECTED NOT DETECTED Final   Klebsiella pneumoniae NOT DETECTED NOT DETECTED Final   Proteus species NOT DETECTED NOT DETECTED Final   Salmonella species NOT DETECTED NOT DETECTED Final   Serratia marcescens NOT DETECTED NOT DETECTED Final   Haemophilus influenzae NOT DETECTED NOT DETECTED Final   Neisseria meningitidis NOT DETECTED NOT DETECTED Final   Pseudomonas aeruginosa DETECTED (A) NOT DETECTED Final    Comment: CRITICAL RESULT CALLED TO, READ BACK BY AND VERIFIED WITH: PHARM D C.SHADE ON [id number] AT 1703 BY E.PARRISH    Stenotrophomonas maltophilia NOT DETECTED NOT DETECTED Final   Candida albicans NOT DETECTED NOT DETECTED Final   Candida auris NOT DETECTED NOT DETECTED Final   Candida glabrata NOT DETECTED NOT DETECTED Final   Candida krusei NOT DETECTED NOT DETECTED Final   Candida parapsilosis NOT DETECTED NOT DETECTED Final   Candida tropicalis NOT DETECTED NOT DETECTED Final   Cryptococcus neoformans/gattii NOT DETECTED NOT DETECTED Final   CTX-M ESBL NOT DETECTED NOT DETECTED Final   Carbapenem resistance IMP NOT DETECTED NOT DETECTED Final   Carbapenem resistance KPC NOT DETECTED NOT DETECTED Final   Carbapenem resistance NDM NOT DETECTED NOT DETECTED Final   Carbapenem resistance VIM NOT DETECTED NOT DETECTED Final    Comment: Performed at [facility], [place] [address] [place], [place] [address]  Culture, blood (x 2)     Status: None (Preliminary result)   Collection Time: 05/23/21  3:45 PM   Specimen: BLOOD LEFT ARM  Result Value Ref Range Status   Specimen Description   Final    BLOOD LEFT ARM Performed at [facility] [address] [place], [place] [address]    Special Requests   Final    BOTTLES DRAWN  AEROBIC AND ANAEROBIC Blood Culture adequate volume Performed at [facility] [address] [place], [place] [address]     Culture   Final    NO GROWTH 4 DAYS Performed at [facility], [place] [address], [place], [place] [address]    Report Status PENDING  Incomplete  MRSA Next Gen by PCR, Nasal     Status: None   Collection Time: 05/23/21  6:56 PM   Specimen: Nasal Mucosa; Nasal Swab  Result Value Ref Range Status   MRSA by PCR Next Gen NOT DETECTED NOT DETECTED Final    Comment: (NOTE) The GeneXpert MRSA Assay (FDA approved for NASAL specimens only), is one component of a comprehensive MRSA colonization surveillance program. It is not intended to diagnose MRSA infection nor to guide or monitor treatment for MRSA infections. Test performance is not FDA approved in patients less than 66 years old. Performed at [facility], [place] [address] [place], [place] [address]   Culture, blood (routine x 2)     Status: None (Preliminary result)   Collection Time: 05/25/21  9:08 AM   Specimen: BLOOD  Result Value Ref Range Status   Specimen Description   Final    BLOOD BLOOD RIGHT FOREARM Performed at [facility] [address] [place], [place] [address]    Special Requests   Final    BOTTLES DRAWN AEROBIC AND ANAEROBIC Blood Culture adequate volume Performed at [facility] [address] [place], [place] [address]    Culture   Final    NO GROWTH 2 DAYS Performed at [name] [address], [place], [place] [address]    Report Status PENDING  Incomplete  Culture, blood (routine x 2)     Status: None (Preliminary result)   Collection Time: 05/25/21  9:08 AM   Specimen: BLOOD  Result Value Ref Range Status   Specimen Description   Final    BLOOD LEFT ANTECUBITAL Performed at [facility] [address] [place], [place] [address]    Special Requests   Final    BOTTLES DRAWN AEROBIC AND ANAEROBIC Blood Culture results may not be optimal due to an inadequate volume of blood received in culture bottles Performed  at [facility] [address], [place], [place] [address]    Culture   Final    NO GROWTH 2 DAYS Performed at [facility] [address] [place], [place] [address]    Report Status PENDING  Incomplete     Scheduled Meds:  amiodarone  200 mg Oral q morning   apixaban  5 mg Oral BID   atorvastatin  20 mg Oral QHS   Chlorhexidine Gluconate Cloth  6 each Topical Daily   clopidogrel  75 mg Oral Daily   finasteride  5 mg Oral QHS   insulin aspart  0-9 Units Subcutaneous Q4H   [START ON 05/28/2021] insulin glargine  12 Units Subcutaneous Once   insulin glargine  25 Units Subcutaneous QHS   [START ON 05/29/2021] insulin glargine  25 Units Subcutaneous QHS   metoprolol tartrate  50 mg Oral BID   tamsulosin  0.4 mg Oral QPC supper   Continuous Infusions:  piperacillin-tazobactam (ZOSYN)  IV 3.375 g (05/27/21 0907)     LOS: 4 days   Cherene Altes, MD [facility] Office  [phone] Pager - Text Page per Amion  If 7PM-7AM, please contact night-coverage per [facility]  05/27/2021, 9:11 AM

## 2021-05-27 NOTE — H&P (View-Only) (Signed)
[name]  ZDG:[id number] DOB: 1946-08-20 DOA: 05/23/2021 PCP: [name], MD    Brief Narrative:  75 year old with a recent history of DLBCL (ABC type), CAD status post PCI with DES to RCA January 2022, chronic paroxysmal atrial fibrillation on Eliquis, CKD stage III AAA, DM2, HTN, and OSA who presented to the hospital with recurrent fever.  He underwent a right ureteral stent placement 6/17 due to significant tumor burden related to retroperitoneal pelvic adenopathy.  At the time of this admission he was felt to be septic due to a UTI.   Consultants:  Infectious Disease Urology Oncology  Code Status: FULL CODE  Antimicrobials:  Cefepime 7/9 >  DVT prophylaxis: Eliquis  Subjective: I spoke to the patient at length using the iPad interpreter.  He reports no new complaints.  He is anxious to get his procedures completed and get out of the hospital soon as possible.  He denies chest pain nausea vomiting or abdominal pain.  Assessment & Plan:  Complicated UTI with sepsis - Pseudomonas bacteremia CT renal study noted extensive perinephric, periureteral, and perivesicular stranding - Pseudomonas noted in urine culture - suspicion is that of incomplete clearance of recent UTI due to neutropenic state -now cleared for long-term IV to facilitate antibiotic therapy (will ask Onc about port v/s PICC as appears he is due for a port-a-cath)  Severe neutropenia WBC stable  Diffuse large B-cell lymphoma Diagnosed 05/04/2021 via right retroperitoneal lymph node biopsy -followed by Dr. Lorenso Courier with staging still in process -oncology following as inpatient - now ready for port v/s PICC to facilitate abx as well as chemo  Acute renal failure on CKD stage IIIa Baseline creatinine approximately 1.4 -acute worsening felt to be due to mass-effect related to retroperitoneal pelvic adenopathy - now improved/stable   Recent Labs  Lab 05/23/21 1540 05/24/21 0324 05/25/21 0338  05/26/21 0334 05/27/21 0338  CREATININE 1.73* 1.69* 1.63* 1.61* 1.58*     Normocytic anemia Due to cancer as well as CKD -hemoglobin stable at approximately 8.5  Prolonged QT interval  Hypomagnesemia Corrected with supplementation  Chronic paroxysmal atrial fibrillation Continue amiodarone and Eliquis  CAD Status post PCI to RCA January 2022 Continue statin, Plavix, Eliquis  DM2 CBG trending upward -gently adjust treatment  HLD  Sleep apnea Continue nightly CPAP per home regimen  Hydronephrosis of right kidney Status post right ureteral stent placement 6/17  BPH   Family Communication:  Status is: Inpatient  Remains inpatient appropriate because:Inpatient level of care appropriate due to severity of illness  Dispo: The patient is from: Home              Anticipated d/c is to: Home              Patient currently is not medically stable to d/c.   Difficult to place patient No   Objective: Blood pressure (!) 156/81, pulse 68, temperature 98.2 F (36.8 C), temperature source Oral, resp. rate 18, height 5\' 10"  (1.778 m), weight 116.8 kg, SpO2 100 %.  Intake/Output Summary (Last 24 hours) at 05/27/2021 0911 Last data filed at 05/27/2021 0758 Gross per 24 hour  Intake 100 ml  Output 1450 ml  Net -1350 ml   Filed Weights   05/26/21 0200  Weight: 116.8 kg    Examination: General: No acute respiratory distress Lungs: Clear to auscultation bilaterally without wheezes or crackles Cardiovascular: Regular rate and rhythm without murmur gallop or rub normal S1 and S2 Abdomen: Nontender, nondistended, soft, bowel sounds positive,  no rebound, no ascites, no appreciable mass Extremities: No significant cyanosis, clubbing, or edema bilateral lower extremities  CBC: Recent Labs  Lab 05/25/21 0338 05/26/21 0334 05/27/21 0338  WBC 2.8* 2.7* 3.0*  NEUTROABS 0.4* 0.5* 0.2*  HGB 8.7* 8.5* 8.6*  HCT 27.2* 27.2* 27.7*  MCV 82.2 83.2 83.4  PLT 130* 147* 628    Basic Metabolic Panel: Recent Labs  Lab 05/23/21 1901 05/24/21 0324 05/25/21 0338 05/26/21 0334 05/27/21 0338  NA  --  140 139 140 142  K  --  3.9 3.6 3.5 3.4*  CL  --  107 109 109 110  CO2  --  26 25 25 27   GLUCOSE  --  196* 167* 137* 116*  BUN  --  23 26* 26* 21  CREATININE  --  1.69* 1.63* 1.61* 1.58*  CALCIUM  --  8.6* 8.1* 8.2* 8.1*  MG 1.6* 2.0 2.0 1.8 1.9  PHOS 3.0 5.0*  --   --   --    GFR: Estimated Creatinine Clearance: 53.3 mL/min (A) (by C-G formula based on SCr of 1.58 mg/dL (H)).  Liver Function Tests: Recent Labs  Lab 05/23/21 1540 05/24/21 0324  AST 16 13*  ALT 23 19  ALKPHOS 94 76  BILITOT 1.4* 1.3*  PROT 6.1* 5.6*  ALBUMIN 3.6 3.0*     Coagulation Profile: Recent Labs  Lab 05/23/21 1540  INR 1.7*    Cardiac Enzymes: Recent Labs  Lab 05/23/21 1901  CKTOTAL 37*    HbA1C: Hgb A1c MFr Bld  Date/Time Value Ref Range Status  05/23/2021 03:40 PM 8.1 (H) 4.8 - 5.6 % Final    Comment:    (NOTE) Pre diabetes:          5.7%-6.4%  Diabetes:              >6.4%  Glycemic control for   <7.0% adults with diabetes   05/01/2021 05:15 AM 7.7 (H) 4.8 - 5.6 % Final    Comment:    (NOTE)         Prediabetes: 5.7 - 6.4         Diabetes: >6.4         Glycemic control for adults with diabetes: <7.0     CBG: Recent Labs  Lab 05/26/21 1601 05/26/21 1952 05/27/21 0033 05/27/21 0449 05/27/21 0735  GLUCAP 286* 222* 117* 124* 193*    Recent Results (from the past 240 hour(s))  Culture, Urine     Status: Abnormal   Collection Time: 05/23/21  3:40 PM   Specimen: Urine, Random  Result Value Ref Range Status   Specimen Description   Final    URINE, RANDOM Performed at [facility] [address] [place], [place] [address]    Special Requests   Final    NONE Performed at [facility], [place] [address] [place], [place] [address]    Culture (A)  Final    >=100,000 COLONIES/mL PSEUDOMONAS  AERUGINOSA 80,000 COLONIES/mL ENTEROCOCCUS FAECALIS    Report Status 05/27/2021 FINAL  Final   Organism ID, Bacteria PSEUDOMONAS AERUGINOSA (A)  Final   Organism ID, Bacteria ENTEROCOCCUS FAECALIS (A)  Final      Susceptibility   Enterococcus faecalis - MIC*    AMPICILLIN <=2 SENSITIVE Sensitive     NITROFURANTOIN <=16 SENSITIVE Sensitive     VANCOMYCIN 1 SENSITIVE Sensitive     * 80,000 COLONIES/mL ENTEROCOCCUS FAECALIS   Pseudomonas aeruginosa - MIC*    CEFTAZIDIME 4  SENSITIVE Sensitive     CIPROFLOXACIN <=0.25 SENSITIVE Sensitive     GENTAMICIN <=1 SENSITIVE Sensitive     IMIPENEM 2 SENSITIVE Sensitive     PIP/TAZO 8 SENSITIVE Sensitive     CEFEPIME 2 SENSITIVE Sensitive     * >=100,000 COLONIES/mL PSEUDOMONAS AERUGINOSA  Resp Panel by RT-PCR (Flu A&B, Covid) Urine, Clean Catch     Status: None   Collection Time: 05/23/21  3:41 PM   Specimen: Urine, Clean Catch; Nasopharyngeal(NP) swabs in vial transport medium  Result Value Ref Range Status   SARS Coronavirus 2 by RT PCR NEGATIVE NEGATIVE Final    Comment: (NOTE) SARS-CoV-2 target nucleic acids are NOT DETECTED.  The SARS-CoV-2 RNA is generally detectable in upper respiratory specimens during the acute phase of infection. The lowest concentration of SARS-CoV-2 viral copies this assay can detect is 138 copies/mL. A negative result does not preclude SARS-Cov-2 infection and should not be used as the sole basis for treatment or other patient management decisions. A negative result may occur with  improper specimen collection/handling, submission of specimen other than nasopharyngeal swab, presence of viral mutation(s) within the areas targeted by this assay, and inadequate number of viral copies(<138 copies/mL). A negative result must be combined with clinical observations, patient history, and epidemiological information. The expected result is Negative.  Fact Sheet for Patients:   EntrepreneurPulse.com.au  Fact Sheet for Healthcare Providers:  IncredibleEmployment.be  This test is no t yet approved or cleared by the [place] FDA and  has been authorized for detection and/or diagnosis of SARS-CoV-2 by FDA under an Emergency Use Authorization (EUA). This EUA will remain  in effect (meaning this test can be used) for the duration of the COVID-19 declaration under Section 564(b)(1) of the Act, 21 U.S.C.section 360bbb-3(b)(1), unless the authorization is terminated  or revoked sooner.       Influenza A by PCR NEGATIVE NEGATIVE Final   Influenza B by PCR NEGATIVE NEGATIVE Final    Comment: (NOTE) The Xpert Xpress SARS-CoV-2/FLU/RSV plus assay is intended as an aid in the diagnosis of influenza from Nasopharyngeal swab specimens and should not be used as a sole basis for treatment. Nasal washings and aspirates are unacceptable for Xpert Xpress SARS-CoV-2/FLU/RSV testing.  Fact Sheet for Patients: EntrepreneurPulse.com.au  Fact Sheet for Healthcare Providers: IncredibleEmployment.be  This test is not yet approved or cleared by the [place] FDA and has been authorized for detection and/or diagnosis of SARS-CoV-2 by FDA under an Emergency Use Authorization (EUA). This EUA will remain in effect (meaning this test can be used) for the duration of the COVID-19 declaration under Section 564(b)(1) of the Act, 21 U.S.C. section 360bbb-3(b)(1), unless the authorization is terminated or revoked.  Performed at [facility], [place] [address], [place], [place] [address]   Culture, blood (x 2)     Status: Abnormal   Collection Time: 05/23/21  3:43 PM   Specimen: BLOOD  Result Value Ref Range Status   Specimen Description   Final    BLOOD RIGHT ANTECUBITAL Performed at [facility], [place] [address], [place], [place] [address]    Special Requests   Final    BOTTLES  DRAWN AEROBIC AND ANAEROBIC Blood Culture adequate volume Performed at [facility] [address], [place], [place] [address]    Culture  Setup Time   Final    GRAM NEGATIVE RODS IN BOTH AEROBIC AND ANAEROBIC BOTTLES CRITICAL RESULT CALLED TO, READ BACK BY AND VERIFIED WITH:  PHARM D C.SHADE ON [id number] AT 1703 BY E.PARRISH Performed at [facility], [place] [address] [place], [place] [address]    Culture PSEUDOMONAS AERUGINOSA (A)  Final   Report Status 05/26/2021 FINAL  Final   Organism ID, Bacteria PSEUDOMONAS AERUGINOSA  Final      Susceptibility   Pseudomonas aeruginosa - MIC*    CEFTAZIDIME 4 SENSITIVE Sensitive     CIPROFLOXACIN <=0.25 SENSITIVE Sensitive     GENTAMICIN <=1 SENSITIVE Sensitive     IMIPENEM 2 SENSITIVE Sensitive     PIP/TAZO 8 SENSITIVE Sensitive     CEFEPIME 2 SENSITIVE Sensitive     * PSEUDOMONAS AERUGINOSA  Blood Culture ID Panel (Reflexed)     Status: Abnormal   Collection Time: 05/23/21  3:43 PM  Result Value Ref Range Status   Enterococcus faecalis NOT DETECTED NOT DETECTED Final   Enterococcus Faecium NOT DETECTED NOT DETECTED Final   Listeria monocytogenes NOT DETECTED NOT DETECTED Final   Staphylococcus species NOT DETECTED NOT DETECTED Final   Staphylococcus aureus (BCID) NOT DETECTED NOT DETECTED Final   Staphylococcus epidermidis NOT DETECTED NOT DETECTED Final   Staphylococcus lugdunensis NOT DETECTED NOT DETECTED Final   Streptococcus species NOT DETECTED NOT DETECTED Final   Streptococcus agalactiae NOT DETECTED NOT DETECTED Final   Streptococcus pneumoniae NOT DETECTED NOT DETECTED Final   Streptococcus pyogenes NOT DETECTED NOT DETECTED Final   A.calcoaceticus-baumannii NOT DETECTED NOT DETECTED Final   Bacteroides fragilis NOT DETECTED NOT DETECTED Final   Enterobacterales NOT DETECTED NOT DETECTED Final   Enterobacter cloacae complex NOT DETECTED NOT DETECTED Final   Escherichia coli NOT DETECTED NOT DETECTED  Final   Klebsiella aerogenes NOT DETECTED NOT DETECTED Final   Klebsiella oxytoca NOT DETECTED NOT DETECTED Final   Klebsiella pneumoniae NOT DETECTED NOT DETECTED Final   Proteus species NOT DETECTED NOT DETECTED Final   Salmonella species NOT DETECTED NOT DETECTED Final   Serratia marcescens NOT DETECTED NOT DETECTED Final   Haemophilus influenzae NOT DETECTED NOT DETECTED Final   Neisseria meningitidis NOT DETECTED NOT DETECTED Final   Pseudomonas aeruginosa DETECTED (A) NOT DETECTED Final    Comment: CRITICAL RESULT CALLED TO, READ BACK BY AND VERIFIED WITH: PHARM D C.SHADE ON [id number] AT 1703 BY E.PARRISH    Stenotrophomonas maltophilia NOT DETECTED NOT DETECTED Final   Candida albicans NOT DETECTED NOT DETECTED Final   Candida auris NOT DETECTED NOT DETECTED Final   Candida glabrata NOT DETECTED NOT DETECTED Final   Candida krusei NOT DETECTED NOT DETECTED Final   Candida parapsilosis NOT DETECTED NOT DETECTED Final   Candida tropicalis NOT DETECTED NOT DETECTED Final   Cryptococcus neoformans/gattii NOT DETECTED NOT DETECTED Final   CTX-M ESBL NOT DETECTED NOT DETECTED Final   Carbapenem resistance IMP NOT DETECTED NOT DETECTED Final   Carbapenem resistance KPC NOT DETECTED NOT DETECTED Final   Carbapenem resistance NDM NOT DETECTED NOT DETECTED Final   Carbapenem resistance VIM NOT DETECTED NOT DETECTED Final    Comment: Performed at [facility], [place] [address] [place], [place] [address]  Culture, blood (x 2)     Status: None (Preliminary result)   Collection Time: 05/23/21  3:45 PM   Specimen: BLOOD LEFT ARM  Result Value Ref Range Status   Specimen Description   Final    BLOOD LEFT ARM Performed at [facility] [address] [place], [place] [address]    Special Requests   Final    BOTTLES DRAWN  AEROBIC AND ANAEROBIC Blood Culture adequate volume Performed at [facility] [address] [place], [place] [address]     Culture   Final    NO GROWTH 4 DAYS Performed at [facility], [place] [address] [place], [place] [address]    Report Status PENDING  Incomplete  MRSA Next Gen by PCR, Nasal     Status: None   Collection Time: 05/23/21  6:56 PM   Specimen: Nasal Mucosa; Nasal Swab  Result Value Ref Range Status   MRSA by PCR Next Gen NOT DETECTED NOT DETECTED Final    Comment: (NOTE) The GeneXpert MRSA Assay (FDA approved for NASAL specimens only), is one component of a comprehensive MRSA colonization surveillance program. It is not intended to diagnose MRSA infection nor to guide or monitor treatment for MRSA infections. Test performance is not FDA approved in patients less than 20 years old. Performed at [facility], [place] [address] [place], [place] [address]   Culture, blood (routine x 2)     Status: None (Preliminary result)   Collection Time: 05/25/21  9:08 AM   Specimen: BLOOD  Result Value Ref Range Status   Specimen Description   Final    BLOOD BLOOD RIGHT FOREARM Performed at [facility] [address], [place], [place] [address]    Special Requests   Final    BOTTLES DRAWN AEROBIC AND ANAEROBIC Blood Culture adequate volume Performed at [facility] [address] [place], [place] [address]    Culture   Final    NO GROWTH 2 DAYS Performed at [facility] [address], [place], [place] [address]    Report Status PENDING  Incomplete  Culture, blood (routine x 2)     Status: None (Preliminary result)   Collection Time: 05/25/21  9:08 AM   Specimen: BLOOD  Result Value Ref Range Status   Specimen Description   Final    BLOOD LEFT ANTECUBITAL Performed at [facility] [address], [place], [place] [address]    Special Requests   Final    BOTTLES DRAWN AEROBIC AND ANAEROBIC Blood Culture results may not be optimal due to an inadequate volume of blood received in culture bottles Performed  at [facility] [address], [place], [place] [address]    Culture   Final    NO GROWTH 2 DAYS Performed at [facility] [address], [place],  [address]    Report Status PENDING  Incomplete     Scheduled Meds:  amiodarone  200 mg Oral q morning   apixaban  5 mg Oral BID   atorvastatin  20 mg Oral QHS   Chlorhexidine Gluconate Cloth  6 each Topical Daily   clopidogrel  75 mg Oral Daily   finasteride  5 mg Oral QHS   insulin aspart  0-9 Units Subcutaneous Q4H   [START ON 05/28/2021] insulin glargine  12 Units Subcutaneous Once   insulin glargine  25 Units Subcutaneous QHS   [START ON 05/29/2021] insulin glargine  25 Units Subcutaneous QHS   metoprolol tartrate  50 mg Oral BID   tamsulosin  0.4 mg Oral QPC supper   Continuous Infusions:  piperacillin-tazobactam (ZOSYN)  IV 3.375 g (05/27/21 0907)     LOS: 4 days   Cherene Altes, MD [facility] Office  [phone] Pager - Text Page per Amion  If 7PM-7AM, please contact night-coverage per [facility]  05/27/2021, 9:11 AM

## 2021-05-28 ENCOUNTER — Inpatient Hospital Stay (HOSPITAL_COMMUNITY)
Admission: RE | Admit: 2021-05-28 | Discharge: 2021-05-28 | Disposition: A | Discharge status: Home / Self Care | Payer: Medicare HMO | Source: Ambulatory Visit | Attending: Hematology and Oncology | Admitting: Hematology and Oncology

## 2021-05-28 ENCOUNTER — Inpatient Hospital Stay: Payer: Self-pay

## 2021-05-28 ENCOUNTER — Ambulatory Visit (HOSPITAL_COMMUNITY): Payer: Medicare HMO

## 2021-05-28 LAB — CULTURE, BLOOD (ROUTINE X 2)
Culture: NO GROWTH
Special Requests: ADEQUATE

## 2021-05-28 LAB — BASIC METABOLIC PANEL
Anion gap: 9 (ref 5–15)
BUN: 18 mg/dL (ref 8–23)
CO2: 25 mmol/L (ref 22–32)
Calcium: 8.2 mg/dL — ABNORMAL LOW (ref 8.9–10.3)
Chloride: 106 mmol/L (ref 98–111)
Creatinine, Ser: 1.42 mg/dL — ABNORMAL HIGH (ref 0.61–1.24)
GFR, Estimated: 52 mL/min — ABNORMAL LOW (ref >60)
Glucose, Bld: 185 mg/dL — ABNORMAL HIGH (ref 70–99)
Potassium: 3.6 mmol/L (ref 3.5–5.1)
Sodium: 140 mmol/L (ref 135–145)

## 2021-05-28 LAB — CBC
HCT: 28.2 % — ABNORMAL LOW (ref 39.0–52.0)
Hemoglobin: 8.7 g/dL — ABNORMAL LOW (ref 13.0–17.0)
MCH: 25.8 pg — ABNORMAL LOW (ref 26.0–34.0)
MCHC: 30.9 g/dL (ref 30.0–36.0)
MCV: 83.7 fL (ref 80.0–100.0)
Platelets: 170 10*3/uL (ref 150–400)
RBC: 3.37 MIL/uL — ABNORMAL LOW (ref 4.22–5.81)
RDW: 15.9 % — ABNORMAL HIGH (ref 11.5–15.5)
WBC: 3.1 10*3/uL — ABNORMAL LOW (ref 4.0–10.5)
nRBC: 0 % (ref 0.0–0.2)

## 2021-05-28 LAB — GLUCOSE, CAPILLARY
Glucose-Capillary: 136 mg/dL — ABNORMAL HIGH (ref 70–99)
Glucose-Capillary: 158 mg/dL — ABNORMAL HIGH (ref 70–99)
Glucose-Capillary: 193 mg/dL — ABNORMAL HIGH (ref 70–99)
Glucose-Capillary: 245 mg/dL — ABNORMAL HIGH (ref 70–99)
Glucose-Capillary: 261 mg/dL — ABNORMAL HIGH (ref 70–99)
Glucose-Capillary: 272 mg/dL — ABNORMAL HIGH (ref 70–99)

## 2021-05-28 LAB — MAGNESIUM: Magnesium: 1.7 mg/dL (ref 1.7–2.4)

## 2021-05-28 MED ORDER — SODIUM CHLORIDE 0.9 % IV SOLN: INTRAVENOUS | Status: DC

## 2021-05-28 NOTE — Progress Notes (Signed)
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 He presented to the ED 7/9 with recurrent fever and was felt to be septic due to a UTI.  Consultants:  Infectious Disease Urology Oncology  Code Status: FULL CODE  Antimicrobials:  Cefepime 7/9 >  DVT prophylaxis: Eliquis  Subjective: Spoke with patient and wife at bedside using iPad interpreter at great length.  Denies new complaints.  Is quite pleasant.  Anxious to be discharged as soon as possible.  Assessment & Plan:  Complicated UTI with sepsis - Pseudomonas bacteremia CT renal study noted extensive perinephric, periureteral, and perivesicular stranding - Pseudomonas noted in urine culture - suspicion is that of incomplete clearance of recent UTI due to neutropenic state as well as indwelling stent - now cleared for long-term IV to facilitate antibiotic therapy - TEE pending (Friday) to r/o vegetation   Hydronephrosis of right kidney Status post right ureteral stent placement 6/17  Severe neutropenia WBC stable  Diffuse large B-cell lymphoma Diagnosed 05/04/2021 via right retroperitoneal lymph node biopsy - followed by Dr. Lorenso Courier with staging still in process   Acute renal failure on CKD stage IIIa Baseline creatinine approximately 1.4 - acute worsening felt to be due to mass-effect related to retroperitoneal pelvic adenopathy - now improved to his baseline   Recent Labs  Lab 05/24/21 0324 05/25/21 0338 05/26/21 0334 05/27/21 0338 05/28/21 0320  CREATININE 1.69* 1.63* 1.61* 1.58* 1.42*    Normocytic anemia Due to cancer as well as CKD -hemoglobin stable at approximately  8.5  Prolonged QT interval  Hypomagnesemia Corrected with supplementation  Chronic paroxysmal atrial fibrillation Continue amiodarone and Eliquis - in NSR at present   CAD Status post PCI to RCA January 2022 - continue statin, Plavix, Eliquis  DM2 CBG well controlled - follow   HLD  Sleep apnea Continue nightly CPAP per home regimen  BPH   Family Communication: As noted above Status is: Inpatient  Remains inpatient appropriate because:Inpatient level of care appropriate due to severity of illness  Dispo: The patient is from: Home              Anticipated d/c is to: Home              Patient currently is not medically stable to d/c.   Difficult to place patient No   Objective: Blood pressure (!) 155/78, pulse 65, temperature 98.3 F (36.8 C), temperature source Oral, resp. rate (!) 21, height 5\' 10"  (1.778 m), weight 116.8 kg, SpO2 100 %.  Intake/Output Summary (Last 24 hours) at 05/28/2021 0949 Last data filed at 05/28/2021 0226 Gross per 24 hour  Intake 515.14 ml  Output 2050 ml  Net -1534.86 ml    Filed Weights   05/26/21 0200  Weight: 116.8 kg    Examination: General: No acute respiratory distress Lungs: CTA B without wheeze Cardiovascular: RRR without murmur Abdomen: NT/ND, soft, BS positive, no rebound Extremities: No significant edema bilateral lower extremities  CBC: Recent Labs  Lab 05/25/21 0338 05/26/21 0334 05/27/21 0338 05/28/21 0320  WBC 2.8* 2.7* 3.0* 3.1*  NEUTROABS 0.4* 0.5* 0.2*  --   HGB 8.7* 8.5* 8.6* 8.7*  HCT  27.2* 27.2* 27.7* 28.2*  MCV 82.2 83.2 83.4 83.7  PLT 130* 147* 187 761    Basic Metabolic Panel: Recent Labs  Lab 05/23/21 1901 05/24/21 0324 05/25/21 0338 05/26/21 0334 05/27/21 0338 05/28/21 0320  NA  --  140   < > 140 142 140  K  --  3.9   < > 3.5 3.4* 3.6  CL  --  107   < > 109 110 106  CO2  --  26   < > 25 27 25   GLUCOSE  --  196*   < > 137* 116* 185*  BUN  --  23   < > 26* 21 18  CREATININE  --   1.69*   < > 1.61* 1.58* 1.42*  CALCIUM  --  8.6*   < > 8.2* 8.1* 8.2*  MG 1.6* 2.0   < > 1.8 1.9 1.7  PHOS 3.0 5.0*  --   --   --   --    < > = values in this interval not displayed.    GFR: Estimated Creatinine Clearance: 59.3 mL/min (A) (by C-G formula based on SCr of 1.42 mg/dL (H)).  Liver Function Tests: Recent Labs  Lab 05/23/21 1540 05/24/21 0324  AST 16 13*  ALT 23 19  ALKPHOS 94 76  BILITOT 1.4* 1.3*  PROT 6.1* 5.6*  ALBUMIN 3.6 3.0*      Coagulation Profile: Recent Labs  Lab 05/23/21 1540  INR 1.7*     Cardiac Enzymes: Recent Labs  Lab 05/23/21 1901  CKTOTAL 37*     HbA1C: Hgb A1c MFr Bld  Date/Time Value Ref Range Status  05/23/2021 03:40 PM 8.1 (H) 4.8 - 5.6 % Final    Comment:    (NOTE) Pre diabetes:          5.7%-6.4%  Diabetes:              >6.4%  Glycemic control for   <7.0% adults with diabetes   05/01/2021 05:15 AM 7.7 (H) 4.8 - 5.6 % Final    Comment:    (NOTE)         Prediabetes: 5.7 - 6.4         Diabetes: >6.4         Glycemic control for adults with diabetes: <7.0     CBG: Recent Labs  Lab 05/27/21 1625 05/27/21 1958 05/28/21 0022 05/28/21 0416 05/28/21 0747  GLUCAP 226* 250* 193* 158* 136*     Recent Results (from the past 240 hour(s))  Culture, Urine     Status: Abnormal   Collection Time: 05/23/21  3:40 PM   Specimen: Urine, Random  Result Value Ref Range Status   Specimen Description   Final    URINE, RANDOM Performed at [facility] [address] [place], [place] [address]    Special Requests   Final    NONE Performed at [facility], [place] [address], [place], [place] [address]    Culture (A)  Final    >=100,000 COLONIES/mL PSEUDOMONAS AERUGINOSA 80,000 COLONIES/mL ENTEROCOCCUS FAECALIS    Report Status 05/27/2021 FINAL  Final   Organism ID, Bacteria PSEUDOMONAS AERUGINOSA (A)  Final   Organism ID, Bacteria ENTEROCOCCUS FAECALIS (A)  Final       Susceptibility   Enterococcus faecalis - MIC*    AMPICILLIN <=2 SENSITIVE Sensitive     NITROFURANTOIN <=16 SENSITIVE Sensitive     VANCOMYCIN 1 SENSITIVE Sensitive     *  80,000 COLONIES/mL ENTEROCOCCUS FAECALIS   Pseudomonas aeruginosa - MIC*    CEFTAZIDIME 4 SENSITIVE Sensitive     CIPROFLOXACIN <=0.25 SENSITIVE Sensitive     GENTAMICIN <=1 SENSITIVE Sensitive     IMIPENEM 2 SENSITIVE Sensitive     PIP/TAZO 8 SENSITIVE Sensitive     CEFEPIME 2 SENSITIVE Sensitive     * >=100,000 COLONIES/mL PSEUDOMONAS AERUGINOSA  Resp Panel by RT-PCR (Flu A&B, Covid) Urine, Clean Catch     Status: None   Collection Time: 05/23/21  3:41 PM   Specimen: Urine, Clean Catch; Nasopharyngeal(NP) swabs in vial transport medium  Result Value Ref Range Status   SARS Coronavirus 2 by RT PCR NEGATIVE NEGATIVE Final    Comment: (NOTE) SARS-CoV-2 target nucleic acids are NOT DETECTED.  The SARS-CoV-2 RNA is generally detectable in upper respiratory specimens during the acute phase of infection. The lowest concentration of SARS-CoV-2 viral copies this assay can detect is 138 copies/mL. A negative result does not preclude SARS-Cov-2 infection and should not be used as the sole basis for treatment or other patient management decisions. A negative result may occur with  improper specimen collection/handling, submission of specimen other than nasopharyngeal swab, presence of viral mutation(s) within the areas targeted by this assay, and inadequate number of viral copies(<138 copies/mL). A negative result must be combined with clinical observations, patient history, and epidemiological information. The expected result is Negative.  Fact Sheet for Patients:  EntrepreneurPulse.com.au  Fact Sheet for Healthcare Providers:  IncredibleEmployment.be  This test is no t yet approved or cleared by the [place] FDA and  has been authorized for detection and/or diagnosis of  SARS-CoV-2 by FDA under an Emergency Use Authorization (EUA). This EUA will remain  in effect (meaning this test can be used) for the duration of the COVID-19 declaration under Section 564(b)(1) of the Act, 21 U.S.C.section 360bbb-3(b)(1), unless the authorization is terminated  or revoked sooner.       Influenza A by PCR NEGATIVE NEGATIVE Final   Influenza B by PCR NEGATIVE NEGATIVE Final    Comment: (NOTE) The Xpert Xpress SARS-CoV-2/FLU/RSV plus assay is intended as an aid in the diagnosis of influenza from Nasopharyngeal swab specimens and should not be used as a sole basis for treatment. Nasal washings and aspirates are unacceptable for Xpert Xpress SARS-CoV-2/FLU/RSV testing.  Fact Sheet for Patients: EntrepreneurPulse.com.au  Fact Sheet for Healthcare Providers: IncredibleEmployment.be  This test is not yet approved or cleared by the [place] FDA and has been authorized for detection and/or diagnosis of SARS-CoV-2 by FDA under an Emergency Use Authorization (EUA). This EUA will remain in effect (meaning this test can be used) for the duration of the COVID-19 declaration under Section 564(b)(1) of the Act, 21 U.S.C. section 360bbb-3(b)(1), unless the authorization is terminated or revoked.  Performed at [facility], [place] [address], [place], [place] [address]   Culture, blood (x 2)     Status: Abnormal   Collection Time: 05/23/21  3:43 PM   Specimen: BLOOD  Result Value Ref Range Status   Specimen Description   Final    BLOOD RIGHT ANTECUBITAL Performed at [facility], [place] [address] [place], [place] [address]    Special Requests   Final    BOTTLES DRAWN AEROBIC AND ANAEROBIC Blood Culture adequate volume Performed at [facility] [address] [place], [place] [address]    Culture  Setup Time   Final    GRAM NEGATIVE RODS IN BOTH  AEROBIC AND ANAEROBIC BOTTLES CRITICAL  RESULT CALLED TO, READ BACK BY AND VERIFIED WITH: PHARM D C.SHADE ON [id number] AT 1703 BY E.PARRISH Performed at [facility], [place] [address], [place], [place] [address]    Culture PSEUDOMONAS AERUGINOSA (A)  Final   Report Status 05/26/2021 FINAL  Final   Organism ID, Bacteria PSEUDOMONAS AERUGINOSA  Final      Susceptibility   Pseudomonas aeruginosa - MIC*    CEFTAZIDIME 4 SENSITIVE Sensitive     CIPROFLOXACIN <=0.25 SENSITIVE Sensitive     GENTAMICIN <=1 SENSITIVE Sensitive     IMIPENEM 2 SENSITIVE Sensitive     PIP/TAZO 8 SENSITIVE Sensitive     CEFEPIME 2 SENSITIVE Sensitive     * PSEUDOMONAS AERUGINOSA  Blood Culture ID Panel (Reflexed)     Status: Abnormal   Collection Time: 05/23/21  3:43 PM  Result Value Ref Range Status   Enterococcus faecalis NOT DETECTED NOT DETECTED Final   Enterococcus Faecium NOT DETECTED NOT DETECTED Final   Listeria monocytogenes NOT DETECTED NOT DETECTED Final   Staphylococcus species NOT DETECTED NOT DETECTED Final   Staphylococcus aureus (BCID) NOT DETECTED NOT DETECTED Final   Staphylococcus epidermidis NOT DETECTED NOT DETECTED Final   Staphylococcus lugdunensis NOT DETECTED NOT DETECTED Final   Streptococcus species NOT DETECTED NOT DETECTED Final   Streptococcus agalactiae NOT DETECTED NOT DETECTED Final   Streptococcus pneumoniae NOT DETECTED NOT DETECTED Final   Streptococcus pyogenes NOT DETECTED NOT DETECTED Final   A.calcoaceticus-baumannii NOT DETECTED NOT DETECTED Final   Bacteroides fragilis NOT DETECTED NOT DETECTED Final   Enterobacterales NOT DETECTED NOT DETECTED Final   Enterobacter cloacae complex NOT DETECTED NOT DETECTED Final   Escherichia coli NOT DETECTED NOT DETECTED Final   Klebsiella aerogenes NOT DETECTED NOT DETECTED Final   Klebsiella oxytoca NOT DETECTED NOT DETECTED Final   Klebsiella pneumoniae NOT DETECTED NOT DETECTED Final   Proteus species NOT DETECTED NOT DETECTED Final   Salmonella species NOT  DETECTED NOT DETECTED Final   Serratia marcescens NOT DETECTED NOT DETECTED Final   Haemophilus influenzae NOT DETECTED NOT DETECTED Final   Neisseria meningitidis NOT DETECTED NOT DETECTED Final   Pseudomonas aeruginosa DETECTED (A) NOT DETECTED Final    Comment: CRITICAL RESULT CALLED TO, READ BACK BY AND VERIFIED WITH: PHARM D C.SHADE ON [id number] AT 1703 BY E.PARRISH    Stenotrophomonas maltophilia NOT DETECTED NOT DETECTED Final   Candida albicans NOT DETECTED NOT DETECTED Final   Candida auris NOT DETECTED NOT DETECTED Final   Candida glabrata NOT DETECTED NOT DETECTED Final   Candida krusei NOT DETECTED NOT DETECTED Final   Candida parapsilosis NOT DETECTED NOT DETECTED Final   Candida tropicalis NOT DETECTED NOT DETECTED Final   Cryptococcus neoformans/gattii NOT DETECTED NOT DETECTED Final   CTX-M ESBL NOT DETECTED NOT DETECTED Final   Carbapenem resistance IMP NOT DETECTED NOT DETECTED Final   Carbapenem resistance KPC NOT DETECTED NOT DETECTED Final   Carbapenem resistance NDM NOT DETECTED NOT DETECTED Final   Carbapenem resistance VIM NOT DETECTED NOT DETECTED Final    Comment: Performed at [facility], [place] [address] [place], [place] [address]  Culture, blood (x 2)     Status: None   Collection Time: 05/23/21  3:45 PM   Specimen: BLOOD LEFT ARM  Result Value Ref Range Status   Specimen Description   Final    BLOOD LEFT ARM Performed at [facility] [address], [place], [place] [address]  Special Requests   Final    BOTTLES DRAWN AEROBIC AND ANAEROBIC Blood Culture adequate volume Performed at [facility] [address] [place], [place] [address]    Culture   Final    NO GROWTH 5 DAYS Performed at [facility], [place] [address], [place], [place] [address]    Report Status 05/28/2021 FINAL  Final  MRSA Next Gen by PCR, Nasal     Status: None   Collection Time: 05/23/21  6:56 PM   Specimen: Nasal Mucosa;  Nasal Swab  Result Value Ref Range Status   MRSA by PCR Next Gen NOT DETECTED NOT DETECTED Final    Comment: (NOTE) The GeneXpert MRSA Assay (FDA approved for NASAL specimens only), is one component of a comprehensive MRSA colonization surveillance program. It is not intended to diagnose MRSA infection nor to guide or monitor treatment for MRSA infections. Test performance is not FDA approved in patients less than 75 years old. Performed at [facility], [place] [address], [place], [place] [address]   Culture, blood (routine x 2)     Status: None (Preliminary result)   Collection Time: 05/25/21  9:08 AM   Specimen: BLOOD  Result Value Ref Range Status   Specimen Description   Final    BLOOD BLOOD RIGHT FOREARM Performed at [facility] [address] [place], [place] [address]    Special Requests   Final    BOTTLES DRAWN AEROBIC AND ANAEROBIC Blood Culture adequate volume Performed at [facility] [address] [place], [place] [address]    Culture   Final    NO GROWTH 3 DAYS Performed at [facility], [place] [address] [place], [place] [address]    Report Status PENDING  Incomplete  Culture, blood (routine x 2)     Status: None (Preliminary result)   Collection Time: 05/25/21  9:08 AM   Specimen: BLOOD  Result Value Ref Range Status   Specimen Description   Final    BLOOD LEFT ANTECUBITAL Performed at [facility] [address] [place], [place] [address]    Special Requests   Final    BOTTLES DRAWN AEROBIC AND ANAEROBIC Blood Culture results may not be optimal due to an inadequate volume of blood received in culture bottles Performed at [facility] [address], [place], [place] [address]    Culture   Final    NO GROWTH 3 DAYS Performed at [facility], [place] [address] [place],  [address]    Report Status PENDING  Incomplete      Scheduled Meds:   amiodarone  200 mg Oral q morning   apixaban  5 mg Oral BID   atorvastatin  20 mg Oral QHS   Chlorhexidine Gluconate Cloth  6 each Topical Daily   clopidogrel  75 mg Oral Daily   finasteride  5 mg Oral QHS   insulin aspart  0-9 Units Subcutaneous Q4H   insulin glargine  12 Units Subcutaneous Once   metoprolol tartrate  50 mg Oral BID   tamsulosin  0.4 mg Oral QPC supper   Continuous Infusions:  [START ON 05/29/2021] sodium chloride     piperacillin-tazobactam (ZOSYN)  IV 3.375 g (05/28/21 0844)     LOS: 5 days   Cherene Altes, MD [facility] Office  [phone] Pager - Text Page per Shea Evans  If 7PM-7AM, please contact night-coverage per Amion 05/28/2021, 9:49 AM

## 2021-05-28 NOTE — Progress Notes (Signed)
Physical Therapy Treatment and Discharge from Acute PT Patient Details Name: [name] MRN: [id number] DOB: January 13, 1946 Today's Date: 05/28/2021    History of Present Illness This 75 y.o. male presented to hospital with fever.  Dx: sepsis due to UTI.  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  Pt no longer requires skilled PT at this time.  Pt would benefit from nursing or family mobilizing with pt during remainder of hospital stay.  PT to sign off at this time.   Follow Up Recommendations  Supervision for mobility/OOB     Equipment Recommendations  None recommended by PT    Recommendations for Other Services       Precautions / Restrictions Precautions Precautions: None    Mobility  Bed Mobility Overal bed mobility: Independent                  Transfers Overall transfer level: Modified independent                  Ambulation/Gait Ambulation/Gait assistance: Modified independent (Device/Increase time) Gait Distance (Feet): 800 Feet Assistive device: IV Pole Gait Pattern/deviations: Step-through pattern;Decreased stride length     General Gait Details: quick pace, no LOB observed, SPO2 reading 85% at lowest during ambulation (?accuracy) however 92% on room air upon return to room and 100% within 2 minutes   Stairs             Wheelchair Mobility    Modified Rankin (Stroke Patients Only)       Balance                                            Cognition Arousal/Alertness: Awake/alert Behavior During Therapy: WFL for tasks assessed/performed Overall Cognitive Status: Within Functional  Limits for tasks assessed                                 General Comments: requires interpreter, spouse present and assisted with basic English      Exercises      General Comments        Pertinent Vitals/Pain Pain Assessment: No/denies pain    Home Living                      Prior Function            PT Goals (current goals can now be found in the care plan section) Progress towards PT goals: Goals met/education completed, patient discharged from PT    Frequency    Min 3X/week      PT Plan Current plan remains appropriate    Co-evaluation              AM-PAC PT "6 Clicks" Mobility   Outcome Measure  Help needed turning from your back to your side while in a flat bed without using bedrails?: None Help needed moving from lying on your back to sitting on the side of a flat bed without using bedrails?: None Help needed moving to  and from a bed to a chair (including a wheelchair)?: None Help needed standing up from a chair using your arms (e.g., wheelchair or bedside chair)?: None Help needed to walk in hospital room?: None Help needed climbing 3-5 steps with a railing? : A Little 6 Click Score: 23    End of Session   Activity Tolerance: Patient tolerated treatment well Patient left: in bed;with call bell/phone within reach;with family/visitor present Nurse Communication: Mobility status PT Visit Diagnosis: Difficulty in walking, not elsewhere classified (R26.2)     Time: 1040-1050 PT Time Calculation (min) (ACUTE ONLY): 10 min  Charges:  $Gait Training: 8-22 mins                     Arlyce Dice, DPT Acute Rehabilitation Services Pager: [phone] Office: [phone]  York Ram E 05/28/2021, 12:53 PM

## 2021-05-29 ENCOUNTER — Encounter (HOSPITAL_COMMUNITY)
Admission: EM | Disposition: A | Discharge status: Home Health | Payer: Self-pay | Source: Home / Self Care | Attending: Internal Medicine

## 2021-05-29 ENCOUNTER — Inpatient Hospital Stay (HOSPITAL_COMMUNITY): Payer: Medicare HMO | Admitting: Certified Registered Nurse Anesthetist

## 2021-05-29 ENCOUNTER — Encounter (HOSPITAL_COMMUNITY): Payer: Self-pay | Admitting: Internal Medicine

## 2021-05-29 ENCOUNTER — Inpatient Hospital Stay (HOSPITAL_COMMUNITY): Payer: Medicare HMO

## 2021-05-29 DIAGNOSIS — R7881 Bacteremia: Secondary | ICD-10-CM

## 2021-05-29 HISTORY — PX: TEE WITHOUT CARDIOVERSION: SHX5443

## 2021-05-29 LAB — GLUCOSE, CAPILLARY
Glucose-Capillary: 137 mg/dL — ABNORMAL HIGH (ref 70–99)
Glucose-Capillary: 247 mg/dL — ABNORMAL HIGH (ref 70–99)
Glucose-Capillary: 281 mg/dL — ABNORMAL HIGH (ref 70–99)
Glucose-Capillary: 337 mg/dL — ABNORMAL HIGH (ref 70–99)
Glucose-Capillary: 373 mg/dL — ABNORMAL HIGH (ref 70–99)

## 2021-05-29 SURGERY — ECHOCARDIOGRAM, TRANSESOPHAGEAL
Anesthesia: Monitor Anesthesia Care

## 2021-05-29 MED ORDER — LIDOCAINE 2% (20 MG/ML) 5 ML SYRINGE
INTRAMUSCULAR | Status: DC | PRN
  Administered 2021-05-29: 40 mg via INTRAVENOUS

## 2021-05-29 MED ORDER — PROPOFOL 10 MG/ML IV BOLUS
INTRAVENOUS | Status: DC | PRN
  Administered 2021-05-29: 40 mg via INTRAVENOUS

## 2021-05-29 MED ORDER — PROPOFOL 500 MG/50ML IV EMUL
INTRAVENOUS | Status: DC | PRN
  Administered 2021-05-29: 125 ug/kg/min via INTRAVENOUS

## 2021-05-29 NOTE — TOC Progression Note (Signed)
Transition of Care [facility]) - Progression Note    Patient Details  Name: [name] MRN: [id number] Date of Birth: 1946/05/15  [facility] CM/SW Contact  Ross Ludwig, [place] Phone Number: 05/29/2021, 5:09 PM  Clinical Narrative:    Patient is currently being followed by Pam from [facility].  Patient plans to discharge home with daughter and other family members.        Expected Discharge Plan and Services    Patient to discharge home with home health and IV antibiotics.                                             Social Determinants of Health (SDOH) Interventions    Readmission Risk Interventions No flowsheet data found.

## 2021-05-29 NOTE — Progress Notes (Signed)
  Echocardiogram Echocardiogram Transesophageal has been performed.  [name] 05/29/2021, 1:16 PM

## 2021-05-29 NOTE — Anesthesia Preprocedure Evaluation (Addendum)
Anesthesia Evaluation  Patient identified by MRN, date of birth, ID band Patient awake    Reviewed: Allergy & Precautions, NPO status , Patient's Chart, lab work & pertinent test results  Airway Mallampati: II  TM Distance: >3 FB Neck ROM: Full    Dental  (+) Caps, Dental Advisory Given   Pulmonary sleep apnea and Continuous Positive Airway Pressure Ventilation ,    Pulmonary exam normal breath sounds clear to auscultation       Cardiovascular hypertension, + angina + CAD  + dysrhythmias (on eliquis) Atrial Fibrillation  Rhythm:Irregular Rate:Normal  TTE 2022 1. No obvious regional wall motion abnormaliies are seen, but the apical  views are difficult to evaluate. Left ventricular ejection fraction, by  estimation, is 45 to 50%. The left ventricle has mildly decreased  function. Left ventricular endocardial  border not optimally defined to evaluate regional wall motion. The left  ventricular internal cavity size was mildly dilated. Left ventricular  diastolic parameters are consistent with Grade II diastolic dysfunction  (pseudonormalization). Elevated left  atrial pressure.  2. Right ventricular systolic function is normal. The right ventricular  size is mildly enlarged. There is mildly elevated pulmonary artery  systolic pressure. The estimated right ventricular systolic pressure is  14.4 mmHg.  3. Left atrial size was moderately dilated.  4. Right atrial size was mildly dilated.  5. The mitral valve is normal in structure. No evidence of mitral valve  regurgitation.  6. The aortic valve is tricuspid. There is mild calcification of the  aortic valve. There is mild thickening of the aortic valve. Aortic valve  regurgitation is not visualized. Mild to moderate aortic valve  sclerosis/calcification is present, without any  evidence of aortic stenosis.  7. Aortic dilatation noted. There is mild dilatation of the aortic  root,  measuring 40 mm.    Neuro/Psych negative neurological ROS  negative psych ROS   GI/Hepatic negative GI ROS, Neg liver ROS,   Endo/Other  negative endocrine ROSdiabetes, Type 2, Insulin Dependent  Renal/GU Renal InsufficiencyRenal disease (Cr 1.42, K 3.6)  negative genitourinary   Musculoskeletal negative musculoskeletal ROS (+)   Abdominal   Peds  Hematology  (+) Blood dyscrasia (on plavix, Hgb 8.7), anemia ,   Anesthesia Other Findings 75yo with a recent diagnosos of DLBCL (ABC type), CAD status post PCI with DES to RCA January 2022, chronic PAF on Eliquis, CKD stage IIIa, DM2, HTN, and OSA who underwent a R ureteral stent placement 6/17 due to significant tumor burden related to retroperitoneal pelvic adenopathy. He presented to the ED 7/9 with a fever and was felt to be septic due to a UTI. TEE to r/o vegetation.  Reproductive/Obstetrics                           Anesthesia Physical Anesthesia Plan  ASA: 3  Anesthesia Plan: MAC   Post-op Pain Management:    Induction: Intravenous  PONV Risk Score and Plan: Propofol infusion and Treatment may vary due to age or medical condition  Airway Management Planned: Natural Airway  Additional Equipment:   Intra-op Plan:   Post-operative Plan:   Informed Consent: I have reviewed the patients History and Physical, chart, labs and discussed the procedure including the risks, benefits and alternatives for the proposed anesthesia with the patient or authorized representative who has indicated his/her understanding and acceptance.     Dental advisory given  Plan Discussed with: CRNA  Anesthesia Plan Comments:  Anesthesia Quick Evaluation  

## 2021-05-29 NOTE — Anesthesia Postprocedure Evaluation (Signed)
Anesthesia Post Note  Patient: [name]  Procedure(s) Performed: TRANSESOPHAGEAL ECHOCARDIOGRAM (TEE)     Patient location during evaluation: Endoscopy Anesthesia Type: MAC Level of consciousness: awake and alert Pain management: pain level controlled Vital Signs Assessment: post-procedure vital signs reviewed and stable Respiratory status: spontaneous breathing, nonlabored ventilation, respiratory function stable and patient connected to nasal cannula oxygen Cardiovascular status: blood pressure returned to baseline and stable Postop Assessment: no apparent nausea or vomiting Anesthetic complications: no   No notable events documented.  Last Vitals:  Vitals:   05/29/21 1335 05/29/21 1345  BP: (!) 156/62 (!) 158/72  Pulse: (!) 53 (!) 53  Resp: 17 11  Temp:    SpO2: 100% 100%    Last Pain:  Vitals:   05/29/21 1345  TempSrc:   PainSc: 0-No pain                 Demetry Bendickson L Anea Fodera

## 2021-05-29 NOTE — CV Procedure (Signed)
    TRANSESOPHAGEAL ECHOCARDIOGRAM   NAME:  [name]    MRN: [id number] DOB:  Sep 03, 1946    ADMIT DATE: 05/23/2021  INDICATIONS: Bacteremia  PROCEDURE:   Informed consent was obtained prior to the procedure. The risks, benefits and alternatives for the procedure were discussed and the patient comprehended these risks.  Risks include, but are not limited to, cough, sore throat, vomiting, nausea, somnolence, esophageal and stomach trauma or perforation, bleeding, low blood pressure, aspiration, pneumonia, infection, trauma to the teeth and death.    Procedural time out performed. The oropharynx was anesthetized with topical 1% benzocaine.    Anesthesia was administered by Dr. Lanetta Inch and team.  The patient was administered a total of Propofol 382 mgo achieve and maintain moderate to deep conscious sedation.  The patient's heart rate, blood pressure, and oxygen saturation are monitored continuously during the procedure. The period of conscious sedation is 18 minutes, of which I was present face-to-face 100% of this time.   The transesophageal probe was inserted in the esophagus and stomach without difficulty and multiple views were obtained.   COMPLICATIONS:    There were no immediate complications.  KEY FINDINGS:  No frank vegetation, low normal LVEF.  There is a < 1 cm echodensity on the aortic surface on the mitral valve with out associated regurgitation.  Echodensity is most consistent with valvular calcification, though comparison imaging not available. Full report to follow. Further management per primary team. Discussed with primary team, ID team, patient and family.   Rudean Haskell, MD [place]  1:12 PM

## 2021-05-29 NOTE — Interval H&P Note (Signed)
History and Physical Interval Note:  05/29/2021 11:34 AM  [name]  has presented today for surgery, with the diagnosis of BACTEREMIA.  The various methods of treatment have been discussed with the patient and family. After consideration of risks, benefits and other options for treatment, the patient has consented to  Procedure(s): TRANSESOPHAGEAL ECHOCARDIOGRAM (TEE) (N/A) as a surgical intervention.  The patient's history has been reviewed, patient examined, no change in status, stable for surgery.  I have reviewed the patient's chart and labs.  Questions were answered to the patient's satisfaction.     Tandy Lewin A Kirbie Stodghill

## 2021-05-29 NOTE — Anesthesia Procedure Notes (Signed)
Procedure Name: MAC Date/Time: 05/29/2021 12:29 PM Performed by: Reece Agar, CRNA Pre-anesthesia Checklist: Patient identified, Emergency Drugs available, Suction available and Patient being monitored Patient Re-evaluated:Patient Re-evaluated prior to induction Oxygen Delivery Method: Nasal cannula

## 2021-05-29 NOTE — Progress Notes (Signed)
PHARMACY CONSULT NOTE FOR:  OUTPATIENT  PARENTERAL ANTIBIOTIC THERAPY (OPAT)  Indication: pseudomonas bacteremia with possible endocarditis Regimen: Zosyn 13.5 g Q24H continuous infusion  End date: 07/08/21  IV antibiotic discharge orders are pended. To discharging provider:  please sign these orders via discharge navigator,  Select New Orders & click on the button choice - Manage This Unsigned Work.    Thank you for allowing pharmacy to be a part of this patient's care.  Faustino Congress, PharmD Candidate 05/29/2021, 3:40 PM

## 2021-05-29 NOTE — Transfer of Care (Signed)
Immediate Anesthesia Transfer of Care Note  Patient: [name]  Procedure(s) Performed: TRANSESOPHAGEAL ECHOCARDIOGRAM (TEE)  Patient Location: Endoscopy Unit  Anesthesia Type:MAC  Level of Consciousness: drowsy  Airway & Oxygen Therapy: Patient Spontanous Breathing  Post-op Assessment: Report given to RN and Post -op Vital signs reviewed and stable  Post vital signs: Reviewed and stable  Last Vitals:  Vitals Value Taken Time  BP 124/56 05/29/21 1303  Temp 37.1 C 05/29/21 1303  Pulse 57 05/29/21 1306  Resp 18 05/29/21 1306  SpO2 99 % 05/29/21 1306  Vitals shown include unvalidated device data.  Last Pain:  Vitals:   05/29/21 1303  TempSrc: Axillary  PainSc: 0-No pain      Patients Stated Pain Goal: 0 (86/75/44 9201)  Complications: No notable events documented.

## 2021-05-29 NOTE — Progress Notes (Signed)
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 He presented to the ED 7/9 with a fever and was felt to be septic due to a UTI.  Significant Events: 6/17 admit [facility] - cystoscopy w/ R ureteral stent placement ([facility]) for R hydronephrosis 6/20 CT guided R retroperitoneal mass bx 6/21 d/c home  6/22 re-admit [facility] fever, dysuria, urinary hesitancy/retention - Pseudomonas UTI 6/26 d/c home  6/29 outpt Onc eval - plan to f/u in 2 weeks to start tx after port and TTE 7/9 re-admit with recurring fever  Consultants:  Infectious Disease Urology Oncology  Code Status: FULL CODE  Antimicrobials:  Cefepime 7/9 > 7/13 Zosyn 7/13 >  DVT prophylaxis: Eliquis  Subjective: Pt was off campus today during my multiple attempts to visit him. His chart was reviewed in detail.   Assessment & Plan:  Complicated UTI with sepsis - Pseudomonas bacteremia CT renal study noted extensive perinephric, periureteral, and perivesicular stranding - Pseudomonas and Enterococcus noted in urine culture - Pseudomonas alone noted in blood culture - suspicion is that of incomplete clearance of recent UTI due to neutropenic state as well as indwelling stent - TEE pending to r/o vegetation - favor holding off on port placement until antibiotic course completed but PICC line okay per ID  Hydronephrosis of right kidney Status post right ureteral stent placement 6/17 -follow-up with urology already established  Severe neutropenia WBC stable  Diffuse large B-cell lymphoma Diagnosed 05/04/2021 via right retroperitoneal lymph node biopsy - followed by Dr. Lorenso Courier with staging  still in process -has PET scan scheduled 06/03/2021  Acute renal failure on CKD stage IIIa Baseline creatinine approximately 1.4 - acute worsening felt to be due to mass-effect related to retroperitoneal pelvic adenopathy - now improved to his baseline   Recent Labs  Lab 05/24/21 0324 05/25/21 0338 05/26/21 0334 05/27/21 0338 05/28/21 0320  CREATININE 1.69* 1.63* 1.61* 1.58* 1.42*    Normocytic anemia Due to cancer as well as CKD -hemoglobin stable at approximately 8.5  Prolonged QT interval  Hypomagnesemia Corrected with supplementation  Chronic paroxysmal atrial fibrillation Continue amiodarone and Eliquis - in NSR at present   CAD Status post PCI to RCA January 2022 - continue statin, Plavix, Eliquis  DM2 CBG well controlled - follow   HLD  Sleep apnea Continue nightly CPAP per home regimen  BPH   Family Communication: As noted above Status is: Inpatient  Remains inpatient appropriate because:Inpatient level of care appropriate due to severity of illness  Dispo: The patient is from: Home              Anticipated d/c is to: Home              Patient currently is not medically stable to d/c.   Difficult to place patient No   Objective: Blood pressure (!) 111/44, pulse (!) 54, temperature 98 F (36.7 C), temperature source Oral, resp. rate 16, height 5\' 10"  (1.778 m), weight 116.8 kg, SpO2 98 %.  Intake/Output Summary (Last 24 hours) at 05/29/2021 0829 Last data filed at 05/29/2021 0800 Gross per 24 hour  Intake 657.2 ml  Output 2600 ml  Net -1942.8 ml  Filed Weights   05/26/21 0200  Weight: 116.8 kg    Examination:   CBC: Recent Labs  Lab 05/25/21 0338 05/26/21 0334 05/27/21 0338 05/28/21 0320  WBC 2.8* 2.7* 3.0* 3.1*  NEUTROABS 0.4* 0.5* 0.2*  --   HGB 8.7* 8.5* 8.6* 8.7*  HCT 27.2* 27.2* 27.7* 28.2*  MCV 82.2 83.2 83.4 83.7  PLT 130* 147* 187 294    Basic Metabolic Panel: Recent Labs  Lab 05/23/21 1901 05/24/21 0324  05/25/21 0338 05/26/21 0334 05/27/21 0338 05/28/21 0320  NA  --  140   < > 140 142 140  K  --  3.9   < > 3.5 3.4* 3.6  CL  --  107   < > 109 110 106  CO2  --  26   < > 25 27 25   GLUCOSE  --  196*   < > 137* 116* 185*  BUN  --  23   < > 26* 21 18  CREATININE  --  1.69*   < > 1.61* 1.58* 1.42*  CALCIUM  --  8.6*   < > 8.2* 8.1* 8.2*  MG 1.6* 2.0   < > 1.8 1.9 1.7  PHOS 3.0 5.0*  --   --   --   --    < > = values in this interval not displayed.    GFR: Estimated Creatinine Clearance: 59.3 mL/min (A) (by C-G formula based on SCr of 1.42 mg/dL (H)).  Liver Function Tests: Recent Labs  Lab 05/23/21 1540 05/24/21 0324  AST 16 13*  ALT 23 19  ALKPHOS 94 76  BILITOT 1.4* 1.3*  PROT 6.1* 5.6*  ALBUMIN 3.6 3.0*      Coagulation Profile: Recent Labs  Lab 05/23/21 1540  INR 1.7*     Cardiac Enzymes: Recent Labs  Lab 05/23/21 1901  CKTOTAL 37*     HbA1C: Hgb A1c MFr Bld  Date/Time Value Ref Range Status  05/23/2021 03:40 PM 8.1 (H) 4.8 - 5.6 % Final    Comment:    (NOTE) Pre diabetes:          5.7%-6.4%  Diabetes:              >6.4%  Glycemic control for   <7.0% adults with diabetes   05/01/2021 05:15 AM 7.7 (H) 4.8 - 5.6 % Final    Comment:    (NOTE)         Prediabetes: 5.7 - 6.4         Diabetes: >6.4         Glycemic control for adults with diabetes: <7.0     CBG: Recent Labs  Lab 05/28/21 1736 05/28/21 1934 05/29/21 0002 05/29/21 0303 05/29/21 0747  GLUCAP 261* 245* 373* 247* 137*     Recent Results (from the past 240 hour(s))  Culture, Urine     Status: Abnormal   Collection Time: 05/23/21  3:40 PM   Specimen: Urine, Random  Result Value Ref Range Status   Specimen Description   Final    URINE, RANDOM Performed at [place] [address], [place], [place] [address]    Special Requests   Final    NONE Performed at [facility], [place] [address] [place], [place] [address]    Culture (A)   Final    >=100,000 COLONIES/mL PSEUDOMONAS AERUGINOSA 80,000 COLONIES/mL ENTEROCOCCUS FAECALIS    Report Status 05/27/2021 FINAL  Final   Organism ID, Bacteria PSEUDOMONAS AERUGINOSA (A)  Final   Organism ID, Bacteria ENTEROCOCCUS FAECALIS (A)  Final      Susceptibility   Enterococcus faecalis - MIC*    AMPICILLIN <=2 SENSITIVE Sensitive     NITROFURANTOIN <=16 SENSITIVE Sensitive     VANCOMYCIN 1 SENSITIVE Sensitive     * 80,000 COLONIES/mL ENTEROCOCCUS FAECALIS   Pseudomonas aeruginosa - MIC*    CEFTAZIDIME 4 SENSITIVE Sensitive     CIPROFLOXACIN <=0.25 SENSITIVE Sensitive     GENTAMICIN <=1 SENSITIVE Sensitive     IMIPENEM 2 SENSITIVE Sensitive     PIP/TAZO 8 SENSITIVE Sensitive     CEFEPIME 2 SENSITIVE Sensitive     * >=100,000 COLONIES/mL PSEUDOMONAS AERUGINOSA  Resp Panel by RT-PCR (Flu A&B, Covid) Urine, Clean Catch     Status: None   Collection Time: 05/23/21  3:41 PM   Specimen: Urine, Clean Catch; Nasopharyngeal(NP) swabs in vial transport medium  Result Value Ref Range Status   SARS Coronavirus 2 by RT PCR NEGATIVE NEGATIVE Final    Comment: (NOTE) SARS-CoV-2 target nucleic acids are NOT DETECTED.  The SARS-CoV-2 RNA is generally detectable in upper respiratory specimens during the acute phase of infection. The lowest concentration of SARS-CoV-2 viral copies this assay can detect is 138 copies/mL. A negative result does not preclude SARS-Cov-2 infection and should not be used as the sole basis for treatment or other patient management decisions. A negative result may occur with  improper specimen collection/handling, submission of specimen other than nasopharyngeal swab, presence of viral mutation(s) within the areas targeted by this assay, and inadequate number of viral copies(<138 copies/mL). A negative result must be combined with clinical observations, patient history, and epidemiological information. The expected result is Negative.  Fact Sheet for Patients:   EntrepreneurPulse.com.au  Fact Sheet for Healthcare Providers:  IncredibleEmployment.be  This test is no t yet approved or cleared by the [place] FDA and  has been authorized for detection and/or diagnosis of SARS-CoV-2 by FDA under an Emergency Use Authorization (EUA). This EUA will remain  in effect (meaning this test can be used) for the duration of the COVID-19 declaration under Section 564(b)(1) of the Act, 21 U.S.C.section 360bbb-3(b)(1), unless the authorization is terminated  or revoked sooner.       Influenza A by PCR NEGATIVE NEGATIVE Final   Influenza B by PCR NEGATIVE NEGATIVE Final    Comment: (NOTE) The Xpert Xpress SARS-CoV-2/FLU/RSV plus assay is intended as an aid in the diagnosis of influenza from Nasopharyngeal swab specimens and should not be used as a sole basis for treatment. Nasal washings and aspirates are unacceptable for Xpert Xpress SARS-CoV-2/FLU/RSV testing.  Fact Sheet for Patients: EntrepreneurPulse.com.au  Fact Sheet for Healthcare Providers: IncredibleEmployment.be  This test is not yet approved or cleared by the [place] FDA and has been authorized for detection and/or diagnosis of SARS-CoV-2 by FDA under an Emergency Use Authorization (EUA). This EUA will remain in effect (meaning this test can be used) for the duration of the COVID-19 declaration under Section 564(b)(1) of the Act, 21 U.S.C. section 360bbb-3(b)(1), unless the authorization is terminated or revoked.  Performed at [facility], [place] [address] [place], [place] [address]   Culture, blood (x 2)     Status: Abnormal   Collection Time: 05/23/21  3:43 PM   Specimen: BLOOD  Result Value Ref Range Status   Specimen Description   Final    BLOOD RIGHT ANTECUBITAL Performed at [facility], [place] [address], [place], [place] [address]  Special Requests   Final    BOTTLES  DRAWN AEROBIC AND ANAEROBIC Blood Culture adequate volume Performed at [place] [address] [place], [place] [address]    Culture  Setup Time   Final    GRAM NEGATIVE RODS IN BOTH AEROBIC AND ANAEROBIC BOTTLES CRITICAL RESULT CALLED TO, READ BACK BY AND VERIFIED WITH: PHARM D C.SHADE ON [id number] AT 1703 BY E.PARRISH Performed at [facility], [place] [address] [place], [place] [address]    Culture PSEUDOMONAS AERUGINOSA (A)  Final   Report Status 05/26/2021 FINAL  Final   Organism ID, Bacteria PSEUDOMONAS AERUGINOSA  Final      Susceptibility   Pseudomonas aeruginosa - MIC*    CEFTAZIDIME 4 SENSITIVE Sensitive     CIPROFLOXACIN <=0.25 SENSITIVE Sensitive     GENTAMICIN <=1 SENSITIVE Sensitive     IMIPENEM 2 SENSITIVE Sensitive     PIP/TAZO 8 SENSITIVE Sensitive     CEFEPIME 2 SENSITIVE Sensitive     * PSEUDOMONAS AERUGINOSA  Blood Culture ID Panel (Reflexed)     Status: Abnormal   Collection Time: 05/23/21  3:43 PM  Result Value Ref Range Status   Enterococcus faecalis NOT DETECTED NOT DETECTED Final   Enterococcus Faecium NOT DETECTED NOT DETECTED Final   Listeria monocytogenes NOT DETECTED NOT DETECTED Final   Staphylococcus species NOT DETECTED NOT DETECTED Final   Staphylococcus aureus (BCID) NOT DETECTED NOT DETECTED Final   Staphylococcus epidermidis NOT DETECTED NOT DETECTED Final   Staphylococcus lugdunensis NOT DETECTED NOT DETECTED Final   Streptococcus species NOT DETECTED NOT DETECTED Final   Streptococcus agalactiae NOT DETECTED NOT DETECTED Final   Streptococcus pneumoniae NOT DETECTED NOT DETECTED Final   Streptococcus pyogenes NOT DETECTED NOT DETECTED Final   A.calcoaceticus-baumannii NOT DETECTED NOT DETECTED Final   Bacteroides fragilis NOT DETECTED NOT DETECTED Final   Enterobacterales NOT DETECTED NOT DETECTED Final   Enterobacter cloacae complex NOT DETECTED NOT DETECTED Final   Escherichia coli NOT DETECTED NOT DETECTED  Final   Klebsiella aerogenes NOT DETECTED NOT DETECTED Final   Klebsiella oxytoca NOT DETECTED NOT DETECTED Final   Klebsiella pneumoniae NOT DETECTED NOT DETECTED Final   Proteus species NOT DETECTED NOT DETECTED Final   Salmonella species NOT DETECTED NOT DETECTED Final   Serratia marcescens NOT DETECTED NOT DETECTED Final   Haemophilus influenzae NOT DETECTED NOT DETECTED Final   Neisseria meningitidis NOT DETECTED NOT DETECTED Final   Pseudomonas aeruginosa DETECTED (A) NOT DETECTED Final    Comment: CRITICAL RESULT CALLED TO, READ BACK BY AND VERIFIED WITH: PHARM D C.SHADE ON [id number] AT 1703 BY E.PARRISH    Stenotrophomonas maltophilia NOT DETECTED NOT DETECTED Final   Candida albicans NOT DETECTED NOT DETECTED Final   Candida auris NOT DETECTED NOT DETECTED Final   Candida glabrata NOT DETECTED NOT DETECTED Final   Candida krusei NOT DETECTED NOT DETECTED Final   Candida parapsilosis NOT DETECTED NOT DETECTED Final   Candida tropicalis NOT DETECTED NOT DETECTED Final   Cryptococcus neoformans/gattii NOT DETECTED NOT DETECTED Final   CTX-M ESBL NOT DETECTED NOT DETECTED Final   Carbapenem resistance IMP NOT DETECTED NOT DETECTED Final   Carbapenem resistance KPC NOT DETECTED NOT DETECTED Final   Carbapenem resistance NDM NOT DETECTED NOT DETECTED Final   Carbapenem resistance VIM NOT DETECTED NOT DETECTED Final    Comment: Performed at [facility], [place] [address], [place], [place] [address]  Culture, blood (x 2)  Status: None   Collection Time: 05/23/21  3:45 PM   Specimen: BLOOD LEFT ARM  Result Value Ref Range Status   Specimen Description   Final    BLOOD LEFT ARM Performed at [facility], [place] [address] [place], [place] [address]    Special Requests   Final    BOTTLES DRAWN AEROBIC AND ANAEROBIC Blood Culture adequate volume Performed at [place] [address] [place], [place] [address]    Culture   Final    NO  GROWTH 5 DAYS Performed at [facility], [place] [address], [place], [place] [address]    Report Status 05/28/2021 FINAL  Final  MRSA Next Gen by PCR, Nasal     Status: None   Collection Time: 05/23/21  6:56 PM   Specimen: Nasal Mucosa; Nasal Swab  Result Value Ref Range Status   MRSA by PCR Next Gen NOT DETECTED NOT DETECTED Final    Comment: (NOTE) The GeneXpert MRSA Assay (FDA approved for NASAL specimens only), is one component of a comprehensive MRSA colonization surveillance program. It is not intended to diagnose MRSA infection nor to guide or monitor treatment for MRSA infections. Test performance is not FDA approved in patients less than 14 years old. Performed at [facility], [place] [address], [place], [place] [address]   Culture, blood (routine x 2)     Status: None (Preliminary result)   Collection Time: 05/25/21  9:08 AM   Specimen: BLOOD  Result Value Ref Range Status   Specimen Description   Final    BLOOD BLOOD RIGHT FOREARM Performed at [place] [address] [place], [place] [address]    Special Requests   Final    BOTTLES DRAWN AEROBIC AND ANAEROBIC Blood Culture adequate volume Performed at [place] [address] [place], [place] [address]    Culture   Final    NO GROWTH 4 DAYS Performed at [facility], [place] [address] [place], [place] [address]    Report Status PENDING  Incomplete  Culture, blood (routine x 2)     Status: None (Preliminary result)   Collection Time: 05/25/21  9:08 AM   Specimen: BLOOD  Result Value Ref Range Status   Specimen Description   Final    BLOOD LEFT ANTECUBITAL Performed at [place] [address], [place], [place] [address]    Special Requests   Final    BOTTLES DRAWN AEROBIC AND ANAEROBIC Blood Culture results may not be optimal due to an inadequate volume of blood received in culture bottles Performed at [place] [address] [place], [place] [address]    Culture   Final    NO GROWTH 4 DAYS Performed at [facility], [place] [address] [place], [place] [address]    Report Status PENDING  Incomplete      Scheduled Meds:  amiodarone  200 mg Oral q morning   apixaban  5 mg Oral BID   atorvastatin  20 mg Oral QHS   Chlorhexidine Gluconate Cloth  6 each Topical Daily   clopidogrel  75 mg Oral Daily   finasteride  5 mg Oral QHS   insulin aspart  0-9 Units Subcutaneous Q4H   metoprolol tartrate  50 mg Oral BID   tamsulosin  0.4 mg Oral QPC supper   Continuous Infusions:  sodium chloride     piperacillin-tazobactam (ZOSYN)  IV  Stopped (05/29/21 7289)     LOS: 6 days   Cherene Altes, MD [facility] Office  [phone] Pager - Text Page per Amion  If 7PM-7AM, please contact night-coverage per Amion 05/29/2021, 8:29 AM

## 2021-05-29 NOTE — Interval H&P Note (Signed)
History and Physical Interval Note:  05/29/2021 11:34 AM  [name]  has presented today for surgery, with the diagnosis of BACTEREMIA.  The various methods of treatment have been discussed with the patient and family. After consideration of risks, benefits and other options for treatment, the patient has consented to  Procedure(s): TRANSESOPHAGEAL ECHOCARDIOGRAM (TEE) (N/A) as a surgical intervention.  The patient's history has been reviewed, patient examined, no change in status, stable for surgery.  I have reviewed the patient's chart and labs.  Questions were answered to the patient's satisfaction.     Thurman Sarver A Raylinn Kosar

## 2021-05-30 DIAGNOSIS — A419 Sepsis, unspecified organism: Secondary | ICD-10-CM | POA: Diagnosis not present

## 2021-05-30 DIAGNOSIS — R7881 Bacteremia: Secondary | ICD-10-CM | POA: Diagnosis not present

## 2021-05-30 DIAGNOSIS — A4152 Sepsis due to Pseudomonas: Principal | ICD-10-CM

## 2021-05-30 DIAGNOSIS — R6520 Severe sepsis without septic shock: Secondary | ICD-10-CM

## 2021-05-30 DIAGNOSIS — I358 Other nonrheumatic aortic valve disorders: Secondary | ICD-10-CM

## 2021-05-30 DIAGNOSIS — I480 Paroxysmal atrial fibrillation: Secondary | ICD-10-CM

## 2021-05-30 DIAGNOSIS — G9341 Metabolic encephalopathy: Secondary | ICD-10-CM

## 2021-05-30 DIAGNOSIS — N179 Acute kidney failure, unspecified: Secondary | ICD-10-CM | POA: Diagnosis not present

## 2021-05-30 LAB — BASIC METABOLIC PANEL
Anion gap: 6 (ref 5–15)
BUN: 18 mg/dL (ref 8–23)
CO2: 29 mmol/L (ref 22–32)
Calcium: 8.3 mg/dL — ABNORMAL LOW (ref 8.9–10.3)
Chloride: 105 mmol/L (ref 98–111)
Creatinine, Ser: 1.41 mg/dL — ABNORMAL HIGH (ref 0.61–1.24)
GFR, Estimated: 53 mL/min — ABNORMAL LOW (ref >60)
Glucose, Bld: 254 mg/dL — ABNORMAL HIGH (ref 70–99)
Potassium: 3.5 mmol/L (ref 3.5–5.1)
Sodium: 140 mmol/L (ref 135–145)

## 2021-05-30 LAB — GLUCOSE, CAPILLARY
Glucose-Capillary: 164 mg/dL — ABNORMAL HIGH (ref 70–99)
Glucose-Capillary: 216 mg/dL — ABNORMAL HIGH (ref 70–99)
Glucose-Capillary: 240 mg/dL — ABNORMAL HIGH (ref 70–99)
Glucose-Capillary: 275 mg/dL — ABNORMAL HIGH (ref 70–99)
Glucose-Capillary: 280 mg/dL — ABNORMAL HIGH (ref 70–99)
Glucose-Capillary: 291 mg/dL — ABNORMAL HIGH (ref 70–99)

## 2021-05-30 LAB — CULTURE, BLOOD (ROUTINE X 2)
Culture: NO GROWTH
Culture: NO GROWTH
Special Requests: ADEQUATE

## 2021-05-30 LAB — CBC
HCT: 28.5 % — ABNORMAL LOW (ref 39.0–52.0)
Hemoglobin: 8.8 g/dL — ABNORMAL LOW (ref 13.0–17.0)
MCH: 25.7 pg — ABNORMAL LOW (ref 26.0–34.0)
MCHC: 30.9 g/dL (ref 30.0–36.0)
MCV: 83.1 fL (ref 80.0–100.0)
Platelets: 202 10*3/uL (ref 150–400)
RBC: 3.43 MIL/uL — ABNORMAL LOW (ref 4.22–5.81)
RDW: 15.9 % — ABNORMAL HIGH (ref 11.5–15.5)
WBC: 3.1 10*3/uL — ABNORMAL LOW (ref 4.0–10.5)
nRBC: 0 % (ref 0.0–0.2)

## 2021-05-30 MED ORDER — BISACODYL 5 MG PO TBEC
5.0000 mg | DELAYED_RELEASE_TABLET | Freq: Every day | ORAL | Status: DC | PRN
  Administered 2021-05-30 – 2021-05-31 (×2): 5 mg via ORAL
  Filled 2021-05-30 (×2): qty 1

## 2021-05-30 MED ORDER — SENNOSIDES-DOCUSATE SODIUM 8.6-50 MG PO TABS: 1.0000 | ORAL_TABLET | Freq: Two times a day (BID) | ORAL | Status: DC

## 2021-05-30 MED ORDER — SORBITOL 70 % SOLN: 30.0000 mL | Freq: Every day | Status: DC | PRN

## 2021-05-30 MED ORDER — SODIUM CHLORIDE 0.9% FLUSH: 10.0000 mL | Freq: Two times a day (BID) | INTRAVENOUS | Status: DC

## 2021-05-30 MED ORDER — POLYETHYLENE GLYCOL 3350 17 G PO PACK: 17.0000 g | PACK | Freq: Every day | ORAL | Status: DC

## 2021-05-30 MED ORDER — INSULIN ASPART 100 UNIT/ML IJ SOLN
0.0000 [IU] | Freq: Three times a day (TID) | INTRAMUSCULAR | Status: DC
  Administered 2021-05-30 – 2021-05-31 (×3): 3 [IU] via SUBCUTANEOUS
  Administered 2021-05-31: 2 [IU] via SUBCUTANEOUS
  Administered 2021-06-01: 3 [IU] via SUBCUTANEOUS
  Administered 2021-06-01: 5 [IU] via SUBCUTANEOUS

## 2021-05-30 MED ORDER — INSULIN GLARGINE 100 UNIT/ML ~~LOC~~ SOLN
20.0000 [IU] | Freq: Every day | SUBCUTANEOUS | Status: DC
  Administered 2021-05-30: 20 [IU] via SUBCUTANEOUS
  Filled 2021-05-30: qty 0.2

## 2021-05-30 MED ORDER — INSULIN GLARGINE 100 UNITS/ML SOLOSTAR PEN: 12.0000 [IU] | PEN_INJECTOR | Freq: Every day | SUBCUTANEOUS | Status: DC

## 2021-05-30 MED ORDER — INSULIN ASPART 100 UNIT/ML IJ SOLN
0.0000 [IU] | Freq: Every day | INTRAMUSCULAR | Status: DC
  Administered 2021-05-30: 3 [IU] via SUBCUTANEOUS
  Administered 2021-05-31: 4 [IU] via SUBCUTANEOUS

## 2021-05-30 MED ORDER — MAGNESIUM CITRATE PO SOLN: 1.0000 | Freq: Once | ORAL | Status: DC | PRN

## 2021-05-30 MED ORDER — POLYETHYLENE GLYCOL 3350 17 G PO PACK
17.0000 g | PACK | Freq: Every day | ORAL | Status: DC
  Administered 2021-05-30 – 2021-06-01 (×3): 17 g via ORAL
  Filled 2021-05-30 (×3): qty 1

## 2021-05-30 MED ORDER — SODIUM CHLORIDE 0.9% FLUSH: 10.0000 mL | INTRAVENOUS | Status: DC | PRN

## 2021-05-30 NOTE — Progress Notes (Addendum)
Subjective: No new complaints   Antibiotics:  Anti-infectives (From admission, onward)    Start     Dose/Rate Route Frequency Ordered Stop   05/27/21 1000  piperacillin-tazobactam (ZOSYN) IVPB 3.375 g        3.375 g 12.5 mL/hr over 240 Minutes Intravenous Every 8 hours 05/27/21 0854     05/24/21 1530  cefTRIAXone (ROCEPHIN) 2 g in sodium chloride 0.9 % 100 mL IVPB  Status:  Discontinued        2 g 200 mL/hr over 30 Minutes Intravenous Every 24 hours 05/23/21 1532 05/23/21 1929   05/23/21 2000  ceFEPIme (MAXIPIME) 2 g in sodium chloride 0.9 % 100 mL IVPB  Status:  Discontinued        2 g 200 mL/hr over 30 Minutes Intravenous Every 12 hours 05/23/21 1929 05/27/21 0854   05/23/21 1530  cefTRIAXone (ROCEPHIN) 1 g in sodium chloride 0.9 % 100 mL IVPB  Status:  Discontinued        1 g 200 mL/hr over 30 Minutes Intravenous Every 24 hours 05/23/21 1519 05/23/21 1532       Medications: Scheduled Meds:  amiodarone  200 mg Oral q morning   apixaban  5 mg Oral BID   atorvastatin  20 mg Oral QHS   Chlorhexidine Gluconate Cloth  6 each Topical Daily   clopidogrel  75 mg Oral Daily   finasteride  5 mg Oral QHS   insulin aspart  0-5 Units Subcutaneous QHS   insulin aspart  0-9 Units Subcutaneous TID WC   insulin glargine  20 Units Subcutaneous QHS   metoprolol tartrate  50 mg Oral BID   polyethylene glycol  17 g Oral Daily   sodium chloride flush  10-40 mL Intracatheter Q12H   tamsulosin  0.4 mg Oral QPC supper   Continuous Infusions:  piperacillin-tazobactam (ZOSYN)  IV 3.375 g (05/30/21 1012)   PRN Meds:.acetaminophen **OR** [DISCONTINUED] acetaminophen, bisacodyl, HYDROcodone-acetaminophen, magnesium citrate, sodium chloride flush, sorbitol    Objective: Weight change:   Intake/Output Summary (Last 24 hours) at 05/30/2021 1706 Last data filed at 05/30/2021 1120 Gross per 24 hour  Intake 480 ml  Output 2075 ml  Net -1595 ml   Blood pressure (!) 132/59, pulse (!)  53, temperature 98.3 F (36.8 C), temperature source Oral, resp. rate 18, height 5\' 10"  (1.778 m), weight 112.3 kg, SpO2 99 %. Temp:  [98 F (36.7 C)-98.7 F (37.1 C)] 98.3 F (36.8 C) (07/16 1330) Pulse Rate:  [53-67] 53 (07/16 1330) Resp:  [18-20] 18 (07/16 1330) BP: (132-157)/(59-74) 132/59 (07/16 1330) SpO2:  [99 %-100 %] 99 % (07/16 1330) Weight:  [112.3 kg] 112.3 kg (07/16 0533)  Physical Exam: Physical Exam Constitutional:      Appearance: He is well-developed. He is obese.  HENT:     Head: Normocephalic and atraumatic.  Eyes:     Extraocular Movements: Extraocular movements intact.     Conjunctiva/sclera: Conjunctivae normal.  Cardiovascular:     Rate and Rhythm: Normal rate and regular rhythm.  Pulmonary:     Effort: Pulmonary effort is normal. No respiratory distress.     Breath sounds: Normal breath sounds. No stridor. No wheezing.  Abdominal:     General: There is no distension.     Palpations: Abdomen is soft. There is no mass.  Musculoskeletal:        General: Normal range of motion.     Cervical back: Normal range of motion and neck  supple.  Skin:    General: Skin is warm and dry.     Findings: No erythema or rash.  Neurological:     General: No focal deficit present.     Mental Status: He is alert and oriented to person, place, and time.  Psychiatric:        Mood and Affect: Mood normal.        Behavior: Behavior normal.        Thought Content: Thought content normal.        Judgment: Judgment normal.     CBC:    BMET Recent Labs    05/28/21 0320 05/30/21 0248  NA 140 140  K 3.6 3.5  CL 106 105  CO2 25 29  GLUCOSE 185* 254*  BUN 18 18  CREATININE 1.42* 1.41*  CALCIUM 8.2* 8.3*     Liver Panel  No results for input(s): PROT, ALBUMIN, AST, ALT, ALKPHOS, BILITOT, BILIDIR, IBILI in the last 72 hours.     Sedimentation Rate No results for input(s): ESRSEDRATE in the last 72 hours. C-Reactive Protein No results for input(s): CRP in  the last 72 hours.  Micro Results: Recent Results (from the past 720 hour(s))  Culture, blood (routine x 2)     Status: None   Collection Time: 05/02/21 10:00 AM   Specimen: BLOOD  Result Value Ref Range Status   Specimen Description   Final    BLOOD LEFT ANTECUBITAL Performed at [facility] [address] [place], [place] [address]    Special Requests   Final    BOTTLES DRAWN AEROBIC ONLY Blood Culture adequate volume Performed at [facility] [address], [place], [place] [address]    Culture   Final    NO GROWTH 5 DAYS Performed at [facility], [place] [address], [place], [place] [address]    Report Status 05/07/2021 FINAL  Final  Culture, blood (routine x 2)     Status: None   Collection Time: 05/02/21 10:00 AM   Specimen: BLOOD  Result Value Ref Range Status   Specimen Description   Final    BLOOD BLOOD LEFT HAND Performed at [facility] [address], [place], [place] [address]    Special Requests   Final    BOTTLES DRAWN AEROBIC ONLY Blood Culture adequate volume Performed at [facility] [address] [place], [place] [address]    Culture   Final    NO GROWTH 5 DAYS Performed at [facility], [place] [address], [place], [place] [address]    Report Status 05/07/2021 FINAL  Final  Blood Culture (routine x 2)     Status: Abnormal   Collection Time: 05/06/21  5:48 PM   Specimen: BLOOD LEFT FOREARM  Result Value Ref Range Status   Specimen Description   Final    BLOOD LEFT FOREARM Performed at [facility] [address] [place], [place] [address]    Special Requests   Final    BOTTLES DRAWN AEROBIC AND ANAEROBIC Blood Culture adequate volume Performed at [facility] [address], [place], [place] [address]    Culture  Setup Time   Final    GRAM NEGATIVE RODS ANAEROBIC BOTTLE ONLY Organism ID to follow CRITICAL RESULT  CALLED TO, READ BACK BY AND VERIFIED WITH: Colin Rhein PHARMD [id number] 05/07/21 A BROWNING Performed at [facility], [place] [address] [place], [place] [address]  Culture PSEUDOMONAS AERUGINOSA (A)  Final   Report Status 05/09/2021 FINAL  Final   Organism ID, Bacteria PSEUDOMONAS AERUGINOSA  Final      Susceptibility   Pseudomonas aeruginosa - MIC*    CEFTAZIDIME 4 SENSITIVE Sensitive     CIPROFLOXACIN <=0.25 SENSITIVE Sensitive     GENTAMICIN <=1 SENSITIVE Sensitive     IMIPENEM 2 SENSITIVE Sensitive     PIP/TAZO <=4 SENSITIVE Sensitive     CEFEPIME 2 SENSITIVE Sensitive     * PSEUDOMONAS AERUGINOSA  Blood Culture ID Panel (Reflexed)     Status: Abnormal   Collection Time: 05/06/21  5:48 PM  Result Value Ref Range Status   Enterococcus faecalis NOT DETECTED NOT DETECTED Final   Enterococcus Faecium NOT DETECTED NOT DETECTED Final   Listeria monocytogenes NOT DETECTED NOT DETECTED Final   Staphylococcus species NOT DETECTED NOT DETECTED Final   Staphylococcus aureus (BCID) NOT DETECTED NOT DETECTED Final   Staphylococcus epidermidis NOT DETECTED NOT DETECTED Final   Staphylococcus lugdunensis NOT DETECTED NOT DETECTED Final   Streptococcus species NOT DETECTED NOT DETECTED Final   Streptococcus agalactiae NOT DETECTED NOT DETECTED Final   Streptococcus pneumoniae NOT DETECTED NOT DETECTED Final   Streptococcus pyogenes NOT DETECTED NOT DETECTED Final   A.calcoaceticus-baumannii NOT DETECTED NOT DETECTED Final   Bacteroides fragilis NOT DETECTED NOT DETECTED Final   Enterobacterales NOT DETECTED NOT DETECTED Final   Enterobacter cloacae complex NOT DETECTED NOT DETECTED Final   Escherichia coli NOT DETECTED NOT DETECTED Final   Klebsiella aerogenes NOT DETECTED NOT DETECTED Final   Klebsiella oxytoca NOT DETECTED NOT DETECTED Final   Klebsiella pneumoniae NOT DETECTED NOT DETECTED Final   Proteus species NOT DETECTED NOT DETECTED Final   Salmonella species NOT DETECTED NOT DETECTED  Final   Serratia marcescens NOT DETECTED NOT DETECTED Final   Haemophilus influenzae NOT DETECTED NOT DETECTED Final   Neisseria meningitidis NOT DETECTED NOT DETECTED Final   Pseudomonas aeruginosa DETECTED (A) NOT DETECTED Final    Comment: CRITICAL RESULT CALLED TO, READ BACK BY AND VERIFIED WITH: Colin Rhein PHARMD 1753 05/07/21 A BROWNING    Stenotrophomonas maltophilia NOT DETECTED NOT DETECTED Final   Candida albicans NOT DETECTED NOT DETECTED Final   Candida auris NOT DETECTED NOT DETECTED Final   Candida glabrata NOT DETECTED NOT DETECTED Final   Candida krusei NOT DETECTED NOT DETECTED Final   Candida parapsilosis NOT DETECTED NOT DETECTED Final   Candida tropicalis NOT DETECTED NOT DETECTED Final   Cryptococcus neoformans/gattii NOT DETECTED NOT DETECTED Final   CTX-M ESBL NOT DETECTED NOT DETECTED Final   Carbapenem resistance IMP NOT DETECTED NOT DETECTED Final   Carbapenem resistance KPC NOT DETECTED NOT DETECTED Final   Carbapenem resistance NDM NOT DETECTED NOT DETECTED Final   Carbapenem resistance VIM NOT DETECTED NOT DETECTED Final    Comment: Performed at [facility], [address] [place], [place] [address]  Resp Panel by RT-PCR (Flu A&B, Covid) Nasopharyngeal Swab     Status: None   Collection Time: 05/06/21  5:52 PM   Specimen: Nasopharyngeal Swab; Nasopharyngeal(NP) swabs in vial transport medium  Result Value Ref Range Status   SARS Coronavirus 2 by RT PCR NEGATIVE NEGATIVE Final    Comment: (NOTE) SARS-CoV-2 target nucleic acids are NOT DETECTED.  The SARS-CoV-2 RNA is generally detectable in upper respiratory specimens during the acute phase of infection. The lowest concentration of SARS-CoV-2 viral copies this assay can detect is 138 copies/mL. A negative result does  not preclude SARS-Cov-2 infection and should not be used as the sole basis for treatment or other patient management decisions. A negative result may occur with  improper specimen  collection/handling, submission of specimen other than nasopharyngeal swab, presence of viral mutation(s) within the areas targeted by this assay, and inadequate number of viral copies(<138 copies/mL). A negative result must be combined with clinical observations, patient history, and epidemiological information. The expected result is Negative.  Fact Sheet for Patients:  EntrepreneurPulse.com.au  Fact Sheet for Healthcare Providers:  IncredibleEmployment.be  This test is no t yet approved or cleared by the [place] FDA and  has been authorized for detection and/or diagnosis of SARS-CoV-2 by FDA under an Emergency Use Authorization (EUA). This EUA will remain  in effect (meaning this test can be used) for the duration of the COVID-19 declaration under Section 564(b)(1) of the Act, 21 U.S.C.section 360bbb-3(b)(1), unless the authorization is terminated  or revoked sooner.       Influenza A by PCR NEGATIVE NEGATIVE Final   Influenza B by PCR NEGATIVE NEGATIVE Final    Comment: (NOTE) The Xpert Xpress SARS-CoV-2/FLU/RSV plus assay is intended as an aid in the diagnosis of influenza from Nasopharyngeal swab specimens and should not be used as a sole basis for treatment. Nasal washings and aspirates are unacceptable for Xpert Xpress SARS-CoV-2/FLU/RSV testing.  Fact Sheet for Patients: EntrepreneurPulse.com.au  Fact Sheet for Healthcare Providers: IncredibleEmployment.be  This test is not yet approved or cleared by the [place] FDA and has been authorized for detection and/or diagnosis of SARS-CoV-2 by FDA under an Emergency Use Authorization (EUA). This EUA will remain in effect (meaning this test can be used) for the duration of the COVID-19 declaration under Section 564(b)(1) of the Act, 21 U.S.C. section 360bbb-3(b)(1), unless the authorization is terminated or revoked.  Performed at [facility], [place] [address] [place], [place] [address]   Blood Culture (routine x 2)     Status: Abnormal   Collection Time: 05/06/21  6:02 PM   Specimen: BLOOD  Result Value Ref Range Status   Specimen Description   Final    BLOOD RIGHT ANTECUBITAL Performed at [facility] [address], [place], [place] [address]    Special Requests   Final    BOTTLES DRAWN AEROBIC AND ANAEROBIC Blood Culture adequate volume Performed at [facility] [address] [place], [place] [address]    Culture  Setup Time   Final    GRAM NEGATIVE RODS AEROBIC BOTTLE ONLY CRITICAL VALUE NOTED.  VALUE IS CONSISTENT WITH PREVIOUSLY REPORTED AND CALLED VALUE.    Culture (A)  Final    PSEUDOMONAS AERUGINOSA SUSCEPTIBILITIES PERFORMED ON PREVIOUS CULTURE WITHIN THE LAST 5 DAYS. Performed at [facility], [place] [address] [place], [place] [address]    Report Status 05/10/2021 FINAL  Final  Urine culture     Status: Abnormal   Collection Time: 05/06/21  6:24 PM   Specimen: In/Out Cath Urine  Result Value Ref Range Status   Specimen Description   Final    IN/OUT CATH URINE Performed at [facility] [address] [place], [place] [address]    Special Requests   Final    NONE Performed at [facility], [place] [address], [place],  [address]    Culture 70,000 COLONIES/mL PSEUDOMONAS AERUGINOSA (A)  Final   Report Status 05/09/2021 FINAL  Final   Organism ID, Bacteria PSEUDOMONAS AERUGINOSA (A)  Final      Susceptibility   Pseudomonas aeruginosa - MIC*    CEFTAZIDIME 4 SENSITIVE Sensitive     CIPROFLOXACIN <=0.25 SENSITIVE Sensitive     GENTAMICIN <=1 SENSITIVE Sensitive     IMIPENEM 2 SENSITIVE Sensitive     PIP/TAZO 8 SENSITIVE Sensitive     CEFEPIME 2 SENSITIVE Sensitive     * 70,000 COLONIES/mL PSEUDOMONAS AERUGINOSA  Culture, Urine     Status: Abnormal   Collection Time: 05/23/21  3:40 PM    Specimen: Urine, Random  Result Value Ref Range Status   Specimen Description   Final    URINE, RANDOM Performed at [facility], [place] [address] [place], [place] [address]    Special Requests   Final    NONE Performed at [facility], [place] [address], [place], [place] [address]    Culture (A)  Final    >=100,000 COLONIES/mL PSEUDOMONAS AERUGINOSA 80,000 COLONIES/mL ENTEROCOCCUS FAECALIS    Report Status 05/27/2021 FINAL  Final   Organism ID, Bacteria PSEUDOMONAS AERUGINOSA (A)  Final   Organism ID, Bacteria ENTEROCOCCUS FAECALIS (A)  Final      Susceptibility   Enterococcus faecalis - MIC*    AMPICILLIN <=2 SENSITIVE Sensitive     NITROFURANTOIN <=16 SENSITIVE Sensitive     VANCOMYCIN 1 SENSITIVE Sensitive     * 80,000 COLONIES/mL ENTEROCOCCUS FAECALIS   Pseudomonas aeruginosa - MIC*    CEFTAZIDIME 4 SENSITIVE Sensitive     CIPROFLOXACIN <=0.25 SENSITIVE Sensitive     GENTAMICIN <=1 SENSITIVE Sensitive     IMIPENEM 2 SENSITIVE Sensitive     PIP/TAZO 8 SENSITIVE Sensitive     CEFEPIME 2 SENSITIVE Sensitive     * >=100,000 COLONIES/mL PSEUDOMONAS AERUGINOSA  Resp Panel by RT-PCR (Flu A&B, Covid) Urine, Clean Catch     Status: None   Collection Time: 05/23/21  3:41 PM   Specimen: Urine, Clean Catch; Nasopharyngeal(NP) swabs in vial transport medium  Result Value Ref Range Status   SARS Coronavirus 2 by RT PCR NEGATIVE NEGATIVE Final    Comment: (NOTE) SARS-CoV-2 target nucleic acids are NOT DETECTED.  The SARS-CoV-2 RNA is generally detectable in upper respiratory specimens during the acute phase of infection. The lowest concentration of SARS-CoV-2 viral copies this assay can detect is 138 copies/mL. A negative result does not preclude SARS-Cov-2 infection and should not be used as the sole basis for treatment or other patient management decisions. A negative result may occur with  improper specimen collection/handling, submission of  specimen other than nasopharyngeal swab, presence of viral mutation(s) within the areas targeted by this assay, and inadequate number of viral copies(<138 copies/mL). A negative result must be combined with clinical observations, patient history, and epidemiological information. The expected result is Negative.  Fact Sheet for Patients:  EntrepreneurPulse.com.au  Fact Sheet for Healthcare Providers:  IncredibleEmployment.be  This test is no t yet approved or cleared by the [place] FDA and  has been authorized for detection and/or diagnosis of SARS-CoV-2 by FDA under an Emergency Use Authorization (EUA). This EUA will remain  in effect (meaning this test can be used) for the duration of the COVID-19 declaration under Section 564(b)(1) of the Act, 21 U.S.C.section 360bbb-3(b)(1), unless the authorization is terminated  or revoked sooner.       Influenza A by PCR NEGATIVE NEGATIVE Final   Influenza B by PCR NEGATIVE NEGATIVE Final    Comment: (NOTE) The Xpert Xpress SARS-CoV-2/FLU/RSV plus assay is intended as  an aid in the diagnosis of influenza from Nasopharyngeal swab specimens and should not be used as a sole basis for treatment. Nasal washings and aspirates are unacceptable for Xpert Xpress SARS-CoV-2/FLU/RSV testing.  Fact Sheet for Patients: EntrepreneurPulse.com.au  Fact Sheet for Healthcare Providers: IncredibleEmployment.be  This test is not yet approved or cleared by the [place] FDA and has been authorized for detection and/or diagnosis of SARS-CoV-2 by FDA under an Emergency Use Authorization (EUA). This EUA will remain in effect (meaning this test can be used) for the duration of the COVID-19 declaration under Section 564(b)(1) of the Act, 21 U.S.C. section 360bbb-3(b)(1), unless the authorization is terminated or revoked.  Performed at [facility], [place]  [address] [place], [place] [address]   Culture, blood (x 2)     Status: Abnormal   Collection Time: 05/23/21  3:43 PM   Specimen: BLOOD  Result Value Ref Range Status   Specimen Description   Final    BLOOD RIGHT ANTECUBITAL Performed at [facility], [place] [address], [place], [place] [address]    Special Requests   Final    BOTTLES DRAWN AEROBIC AND ANAEROBIC Blood Culture adequate volume Performed at [facility] [address], [place], [place] [address]    Culture  Setup Time   Final    GRAM NEGATIVE RODS IN BOTH AEROBIC AND ANAEROBIC BOTTLES CRITICAL RESULT CALLED TO, READ BACK BY AND VERIFIED WITH: PHARM D C.SHADE ON [id number] AT 1703 BY E.PARRISH Performed at [facility], [place] [address], [place], [place] [address]    Culture PSEUDOMONAS AERUGINOSA (A)  Final   Report Status 05/26/2021 FINAL  Final   Organism ID, Bacteria PSEUDOMONAS AERUGINOSA  Final      Susceptibility   Pseudomonas aeruginosa - MIC*    CEFTAZIDIME 4 SENSITIVE Sensitive     CIPROFLOXACIN <=0.25 SENSITIVE Sensitive     GENTAMICIN <=1 SENSITIVE Sensitive     IMIPENEM 2 SENSITIVE Sensitive     PIP/TAZO 8 SENSITIVE Sensitive     CEFEPIME 2 SENSITIVE Sensitive     * PSEUDOMONAS AERUGINOSA  Blood Culture ID Panel (Reflexed)     Status: Abnormal   Collection Time: 05/23/21  3:43 PM  Result Value Ref Range Status   Enterococcus faecalis NOT DETECTED NOT DETECTED Final   Enterococcus Faecium NOT DETECTED NOT DETECTED Final   Listeria monocytogenes NOT DETECTED NOT DETECTED Final   Staphylococcus species NOT DETECTED NOT DETECTED Final   Staphylococcus aureus (BCID) NOT DETECTED NOT DETECTED Final   Staphylococcus epidermidis NOT DETECTED NOT DETECTED Final   Staphylococcus lugdunensis NOT DETECTED NOT DETECTED Final   Streptococcus species NOT DETECTED NOT DETECTED Final   Streptococcus agalactiae NOT DETECTED NOT DETECTED Final   Streptococcus pneumoniae NOT DETECTED NOT  DETECTED Final   Streptococcus pyogenes NOT DETECTED NOT DETECTED Final   A.calcoaceticus-baumannii NOT DETECTED NOT DETECTED Final   Bacteroides fragilis NOT DETECTED NOT DETECTED Final   Enterobacterales NOT DETECTED NOT DETECTED Final   Enterobacter cloacae complex NOT DETECTED NOT DETECTED Final   Escherichia coli NOT DETECTED NOT DETECTED Final   Klebsiella aerogenes NOT DETECTED NOT DETECTED Final   Klebsiella oxytoca NOT DETECTED NOT DETECTED Final   Klebsiella pneumoniae NOT DETECTED NOT DETECTED Final   Proteus species NOT DETECTED NOT DETECTED Final   Salmonella species NOT DETECTED NOT DETECTED Final   Serratia marcescens NOT DETECTED NOT DETECTED Final   Haemophilus influenzae NOT DETECTED NOT DETECTED Final  Neisseria meningitidis NOT DETECTED NOT DETECTED Final   Pseudomonas aeruginosa DETECTED (A) NOT DETECTED Final    Comment: CRITICAL RESULT CALLED TO, READ BACK BY AND VERIFIED WITH: PHARM D C.SHADE ON [id number] AT 1703 BY E.PARRISH    Stenotrophomonas maltophilia NOT DETECTED NOT DETECTED Final   Candida albicans NOT DETECTED NOT DETECTED Final   Candida auris NOT DETECTED NOT DETECTED Final   Candida glabrata NOT DETECTED NOT DETECTED Final   Candida krusei NOT DETECTED NOT DETECTED Final   Candida parapsilosis NOT DETECTED NOT DETECTED Final   Candida tropicalis NOT DETECTED NOT DETECTED Final   Cryptococcus neoformans/gattii NOT DETECTED NOT DETECTED Final   CTX-M ESBL NOT DETECTED NOT DETECTED Final   Carbapenem resistance IMP NOT DETECTED NOT DETECTED Final   Carbapenem resistance KPC NOT DETECTED NOT DETECTED Final   Carbapenem resistance NDM NOT DETECTED NOT DETECTED Final   Carbapenem resistance VIM NOT DETECTED NOT DETECTED Final    Comment: Performed at [facility], [place] [address], [place], [place] [address]  Culture, blood (x 2)     Status: None   Collection Time: 05/23/21  3:45 PM   Specimen: BLOOD LEFT ARM  Result Value Ref Range Status    Specimen Description   Final    BLOOD LEFT ARM Performed at [facility] [address], [place], [place] [address]    Special Requests   Final    BOTTLES DRAWN AEROBIC AND ANAEROBIC Blood Culture adequate volume Performed at [facility] [address] [place], [place] [address]    Culture   Final    NO GROWTH 5 DAYS Performed at [facility], [place] [address], [place], [place] [address]    Report Status 05/28/2021 FINAL  Final  MRSA Next Gen by PCR, Nasal     Status: None   Collection Time: 05/23/21  6:56 PM   Specimen: Nasal Mucosa; Nasal Swab  Result Value Ref Range Status   MRSA by PCR Next Gen NOT DETECTED NOT DETECTED Final    Comment: (NOTE) The GeneXpert MRSA Assay (FDA approved for NASAL specimens only), is one component of a comprehensive MRSA colonization surveillance program. It is not intended to diagnose MRSA infection nor to guide or monitor treatment for MRSA infections. Test performance is not FDA approved in patients less than 71 years old. Performed at [facility], [place] [address], [place], [place] [address]   Culture, blood (routine x 2)     Status: None   Collection Time: 05/25/21  9:08 AM   Specimen: BLOOD  Result Value Ref Range Status   Specimen Description   Final    BLOOD BLOOD RIGHT FOREARM Performed at [facility] [address] [place], [place] [address]    Special Requests   Final    BOTTLES DRAWN AEROBIC AND ANAEROBIC Blood Culture adequate volume Performed at [facility] [address] [place], [place] [address]    Culture   Final    NO GROWTH 5 DAYS Performed at [facility], [place] [address], [place], [place] [address]    Report Status 05/30/2021 FINAL  Final  Culture, blood (routine x 2)     Status: None   Collection Time: 05/25/21  9:08 AM   Specimen: BLOOD  Result Value Ref Range Status   Specimen Description   Final     BLOOD LEFT ANTECUBITAL Performed at [facility] [address]  [place], [place] [address]    Special Requests   Final    BOTTLES DRAWN AEROBIC AND ANAEROBIC Blood Culture results may not be optimal due to an inadequate volume of blood received in culture bottles Performed at [facility] [address] [place], [place] [address]    Culture   Final    NO GROWTH 5 DAYS Performed at [facility], [place] [address], [place], [place] [address]    Report Status 05/30/2021 FINAL  Final    Studies/Results: ECHO TEE  Result Date: 05/29/2021    TRANSESOPHOGEAL ECHO REPORT   Patient Name:   [name] Date of Exam: 05/29/2021 Medical Rec #:  [id number]        Height:       70.0 in Accession #:    [id number]       Weight:       257.5 lb Date of Birth:  [birth year]         BSA:          2.324 m Patient Age:    54 years         BP:           159/74 mmHg Patient Gender: M                HR:           56 bpm. Exam Location:  Outpatient Procedure: 2D Echo Indications:     Bacteremia  History:         Patient has prior history of Echocardiogram examinations, most                  recent 05/24/2021. CAD; Risk Factors:Diabetes, Sleep Apnea and                  Dyslipidemia.  Sonographer:     Johny Chess Referring Phys:  Nancy Fetter DUNN Diagnosing Phys: Rudean Haskell MD PROCEDURE: After discussion of the risks and benefits of a TEE, an informed consent was obtained from the patient. The transesophogeal probe was passed without difficulty through the esophogus of the patient. Imaged were obtained with the patient in a left lateral decubitus position. Sedation performed by different physician. The patient was monitored while under deep sedation. Anesthestetic sedation was provided intravenously by Anesthesiology: 388mg  of Propofol. The patient developed no complications during the procedure. IMPRESSIONS  1. The mitral valve is degenerative. There is a <1 cm  echodensity on the atrial surface on the mitral valve without associated regurgitation. Echodensity is most consistent with valvular calcification, though comparison imaging not available. Trivial mitral valve regurgitation. No evidence of mitral stenosis.  2. Aortic dilatation noted. There is moderate dilatation of the ascending aorta, measuring 47 mm. There is Moderate (Grade III) plaque involving the transverse aorta.  3. Left ventricular ejection fraction, by estimation, is 45 to 50%. The left ventricle has mildly decreased function.  4. Right ventricular systolic function is normal. The right ventricular size is normal.  5. No left atrial/left atrial appendage thrombus was detected. The LAA emptying velocity was 41 cm/s.  6. The aortic valve is tricuspid. There is mild calcification of the aortic valve. There is mild thickening of the aortic valve. Aortic valve regurgitation is not visualized. Mild aortic valve sclerosis is present, with no evidence of aortic valve stenosis. Conclusion(s)/Recommendation(s): Consider follow-up imaging for ascending aortic size in 6 months (CTA or MRA). FINDINGS  Left Ventricle: Left ventricular ejection fraction, by estimation, is  45 to 50%. The left ventricle has mildly decreased function. The left ventricular internal cavity size was normal in size. Right Ventricle: The right ventricular size is normal. Right vetricular wall thickness was not assessed. Right ventricular systolic function is normal. Left Atrium: Left atrial size was normal in size. No left atrial/left atrial appendage thrombus was detected. The LAA emptying velocity was 41 cm/s. Right Atrium: Right atrial size was normal in size. Pericardium: There is no evidence of pericardial effusion. Mitral Valve: The mitral valve is degenerative in appearance. Trivial mitral valve regurgitation. No evidence of mitral valve stenosis. Tricuspid Valve: The tricuspid valve is normal in structure. Tricuspid valve regurgitation  is trivial. Aortic Valve: The aortic valve is tricuspid. There is mild calcification of the aortic valve. There is mild thickening of the aortic valve. There is mild aortic valve annular calcification. Aortic valve regurgitation is not visualized. Mild aortic valve sclerosis is present, with no evidence of aortic valve stenosis. Pulmonic Valve: The pulmonic valve was grossly normal. Pulmonic valve regurgitation is not visualized. Aorta: The aortic root is normal in size and structure and aortic dilatation noted. There is moderate dilatation of the ascending aorta, measuring 47 mm. There is moderate (Grade III) plaque involving the transverse aorta. IAS/Shunts: The atrial septum is grossly normal.  LEFT VENTRICLE PLAX 2D LVOT diam:     2.60 cm LVOT Area:     5.31 cm   AORTA Ao Root diam: 3.70 cm Ao Asc diam:  4.70 cm  SHUNTS Systemic Diam: 2.60 cm Rudean Haskell MD Electronically signed by Rudean Haskell MD Signature Date/Time: 05/29/2021/5:10:33 PM    Final       Assessment/Plan:  INTERVAL HISTORY: TEE with possible vegetation though thought to more likely be a calcification   Active Problems:   Hydronephrosis of right kidney   CAD (coronary artery disease)   DM2 (diabetes mellitus, type 2) (HCC)   PAF (paroxysmal atrial fibrillation) (HCC)   Complicated UTI (urinary tract infection)   Sleep apnea   Hyperlipidemia associated with type 2 diabetes mellitus (HCC)   Hypomagnesemia   Acute renal failure superimposed on stage 3a chronic kidney disease (HCC)   Prolonged QT interval   DLBCL (diffuse large B cell lymphoma) (HCC)   Normocytic anemia   BPH (benign prostatic hyperplasia)   Bacteremia due to Gram-negative bacteria   Severe neutropenia (HCC)    [name] is a 75 y.o. male [place] speaking with hx of Diffuse large cell lymphoma now found to have recurrent Pseudomonas UTI (but also with Enterococcus) in setting of ureteral stents placed to treat obstructive pathology  from his tumors, now with Ps aeruginosa bacteremia  TEE shows AV lesion that is more likely a calcification  We will plan on erring on the side of treating for endocarditis with plan for 6-week course of therapy though that could be tailored as an outpatient (note I am not adding FQ or AG which we might do if this was clear case of endocarditis)   He had PICC line placed today and O PAT orders are in place for Zosyn  Diagnosis: Possible endocrine  Culture Result: Pseudomonas aeruginosa  No Known Allergies  OPAT Orders Discharge antibiotics:  Zosyn  by continuous infusion  Duration:  6 weeks   End Date:  07/08/2021  [facility] Per Protocol:    Labs  weekly while on IV antibiotics: _x_ CBC with differential __x BMP w GFR/CMP     _x_ Please pull PIC at completion of IV  antibiotics __ Please leave PIC in place until doctor has seen patient or been notified  Fax weekly labs to [phone]  Clinic Follow Up Appt:   RHYAN RADLER has an appointment on 84/2022 at 4pm with Dr. Tommy Medal  The [facility] is located in the [facility] at  [address] in [place].  [address], which is located to the left of the elevators.  Phone: [phone]  Fax: [phone]  [email or web address]  He should arrive 15-30 minutes BEFORE his appointment.  I spent more than 35 minutes with the patient including face to face counseling of the patient personally reviewing radiographs, along with pertinent laboratory microbiological, data review of medical records before and during the visit and in coordination of his care.  I will sign off for now please call further questions.   LOS: 7 days   Alcide Evener 05/30/2021, 5:06 PM

## 2021-05-30 NOTE — Progress Notes (Signed)
Utilized video interpreter to discuss with patient plans for discharge.  I informed patient he would be receiving his PICC line today but it is unlikely he would be discharged as he needs home antibiotics and the family will have to receive education on the administration of these at home.  I have discussed this with daughter Derrick Ravel as well.  Will follow-up with TOC regarding set up of home antibiotics.

## 2021-05-30 NOTE — TOC Progression Note (Signed)
Transition of Care [facility]) - Progression Note   Patient Details  Name: [name] MRN: [id number] Date of Birth: 04/26/1946  [facility] CM/SW [place], LCSW Phone Number: 05/30/2021, 3:40 PM  Clinical Narrative: Shanda Howells is being placed today. CSW updated Pam with [employer]. Patient/family would like to discharge tomorrow if possible. Patient will need HHRN set up prior to discharge. TOC to follow.  Readmission Risk Interventions No flowsheet data found.

## 2021-05-30 NOTE — Progress Notes (Signed)
Peripherally Inserted Central Catheter Placement  The IV Nurse has discussed with the patient and/or persons authorized to consent for the patient, the purpose of this procedure and the potential benefits and risks involved with this procedure.  The benefits include less needle sticks, lab draws from the catheter, and the patient may be discharged home with the catheter. Risks include, but not limited to, infection, bleeding, blood clot (thrombus formation), and puncture of an artery; nerve damage and irregular heartbeat and possibility to perform a PICC exchange if needed/ordered by physician.  Alternatives to this procedure were also discussed.  Bard Power PICC patient education guide, fact sheet on infection prevention and patient information card has been provided to patient /or left at bedside.   Consent obtained with interpreter .   PICC Placement Documentation  PICC Single Lumen 91/50/56 Right Basilic 44 cm 0 cm (Active)  Indication for Insertion or Continuance of Line Home intravenous therapies (PICC only) 05/30/21 1500  Exposed Catheter (cm) 0 cm 05/30/21 1500  Site Assessment Clean;Dry;Intact 05/30/21 1500  Line Status Flushed;Saline locked;Blood return noted 05/30/21 1500  Dressing Type Transparent;Securing device 05/30/21 1500  Dressing Status Clean;Dry;Intact 05/30/21 1500  Antimicrobial disc in place? Yes 05/30/21 1500  Safety Lock Not Applicable 97/94/80 1655  Line Care Connections checked and tightened 05/30/21 1500  Dressing Intervention New dressing 05/30/21 1500  Dressing Change Due 06/06/21 05/30/21 1500       Holley Bouche Renee 05/30/2021, 3:42 PM

## 2021-05-30 NOTE — Progress Notes (Signed)
Spoke with Nira Conn re PICC order.  Plan on placement this pm, unless discharged today.

## 2021-05-30 NOTE — Progress Notes (Addendum)
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 He presented to the ED 7/9 with a fever and was felt to be septic due to a UTI.  Significant Events: 6/17 admit [facility] - cystoscopy w/ R ureteral stent placement ([place]) for R hydronephrosis 6/20 CT guided R retroperitoneal mass bx 6/21 d/c home  6/22 re-admit [facility] fever, dysuria, urinary hesitancy/retention - Pseudomonas UTI 6/26 d/c home  6/29 outpt Onc eval - plan to f/u in 2 weeks to start tx after port and TTE 7/9 re-admit with recurring fever 7/ TEE at [facility] - no frank veg - <1cm echodensity on mitral valve c/w calcification   Consultants:  Infectious Disease Urology Oncology  Code Status: FULL CODE  Antimicrobials:  Cefepime 7/9 > 7/13 Zosyn 7/13 >  DVT prophylaxis: Eliquis  Subjective: Resting comfortably in bed.  No new complaints whatsoever.  Awaiting PICC line placement. Tells me he wants to go home asap.   Assessment & Plan:  Complicated UTI with sepsis - Pseudomonas bacteremia CT renal study noted extensive perinephric, periureteral, and perivesicular stranding - Pseudomonas and Enterococcus noted in urine culture - Pseudomonas alone noted in blood culture - suspicion is that of incomplete clearance of recent UTI due to neutropenic state as well as indwelling stent - TEE w/o clear evidence of a vegetation - favor holding off on port placement until antibiotic course completed - PICC line to be placed to facilitate outpt abx (w/ blessing of ID)  Hydronephrosis of right kidney Status post right ureteral stent placement 6/17 - follow-up with Urology already  established  Severe neutropenia WBC stable  Recent Labs  Lab 05/25/21 0338 05/26/21 0334 05/27/21 0338 05/28/21 0320 05/30/21 0248  WBC 2.8* 2.7* 3.0* 3.1* 3.1*    Diffuse large B-cell lymphoma Diagnosed 05/04/2021 via right retroperitoneal lymph node biopsy - followed by Dr. Lorenso Courier with staging still in process - has PET scan scheduled 06/03/2021 - for eventual port placement once bacteremia cleared - for now will have PICC in place   Acute renal failure on CKD stage IIIa Baseline creatinine approximately 1.4 - acute worsening felt to be due to mass-effect related to retroperitoneal pelvic adenopathy - now improved to his baseline   Recent Labs  Lab 05/25/21 0338 05/26/21 0334 05/27/21 0338 05/28/21 0320 05/30/21 0248  CREATININE 1.63* 1.61* 1.58* 1.42* 1.41*    Normocytic anemia Due to cancer as well as CKD -hemoglobin stable at approximately 8.5  Recent Labs  Lab 05/25/21 0338 05/26/21 0334 05/27/21 0338 05/28/21 0320 05/30/21 0248  HGB 8.7* 8.5* 8.6* 8.7* 8.8*     Prolonged QT interval  Hypomagnesemia Corrected with supplementation  Chronic paroxysmal atrial fibrillation Continue amiodarone and Eliquis - in NSR at present   CAD Status post PCI to RCA January 2022 - continue statin, Plavix, Eliquis - asymptomatic   DM2 CBG trending upward -adjust treatment and follow  HLD  Sleep apnea Continue nightly CPAP per home regimen  BPH   Family Communication: Spoke with daughter via telephone Status is: Inpatient  Remains inpatient appropriate because:Inpatient level of care appropriate due to severity of illness  Dispo: The patient is from: Home  Anticipated d/c is to: Home              Patient currently is not medically stable to d/c.   Difficult to place patient No   Objective: Blood pressure (!) 144/72, pulse (!) 54, temperature 98.7 F (37.1 C), temperature source Oral, resp. rate 19, height 5\' 10"  (1.778 m), weight 112.3 kg,  SpO2 100 %.  Intake/Output Summary (Last 24 hours) at 05/30/2021 0829 Last data filed at 05/30/2021 0536 Gross per 24 hour  Intake 469.92 ml  Output 2450 ml  Net -1980.08 ml    Filed Weights   05/26/21 0200 05/29/21 1110 05/30/21 0533  Weight: 116.8 kg 113.4 kg 112.3 kg    Examination: General: No acute respiratory distress Lungs: Clear to auscultation bilaterally without wheezes or crackles Cardiovascular: Regular rate and rhythm without murmur gallop or rub normal S1 and S2 Abdomen: Nontender, nondistended, soft, bowel sounds positive, no rebound, no ascites, no appreciable mass Extremities: No significant cyanosis, clubbing, or edema bilateral lower extremities   CBC: Recent Labs  Lab 05/25/21 0338 05/26/21 0334 05/27/21 0338 05/28/21 0320 05/30/21 0248  WBC 2.8* 2.7* 3.0* 3.1* 3.1*  NEUTROABS 0.4* 0.5* 0.2*  --   --   HGB 8.7* 8.5* 8.6* 8.7* 8.8*  HCT 27.2* 27.2* 27.7* 28.2* 28.5*  MCV 82.2 83.2 83.4 83.7 83.1  PLT 130* 147* 187 170 211    Basic Metabolic Panel: Recent Labs  Lab 05/23/21 1901 05/24/21 0324 05/25/21 0338 05/26/21 0334 05/27/21 0338 05/28/21 0320 05/30/21 0248  NA  --  140   < > 140 142 140 140  K  --  3.9   < > 3.5 3.4* 3.6 3.5  CL  --  107   < > 109 110 106 105  CO2  --  26   < > 25 27 25 29   GLUCOSE  --  196*   < > 137* 116* 185* 254*  BUN  --  23   < > 26* 21 18 18   CREATININE  --  1.69*   < > 1.61* 1.58* 1.42* 1.41*  CALCIUM  --  8.6*   < > 8.2* 8.1* 8.2* 8.3*  MG 1.6* 2.0   < > 1.8 1.9 1.7  --   PHOS 3.0 5.0*  --   --   --   --   --    < > = values in this interval not displayed.    GFR: Estimated Creatinine Clearance: 58.5 mL/min (A) (by C-G formula based on SCr of 1.41 mg/dL (H)).  Liver Function Tests: Recent Labs  Lab 05/23/21 1540 05/24/21 0324  AST 16 13*  ALT 23 19  ALKPHOS 94 76  BILITOT 1.4* 1.3*  PROT 6.1* 5.6*  ALBUMIN 3.6 3.0*      Coagulation Profile: Recent Labs  Lab 05/23/21 1540  INR 1.7*      Cardiac Enzymes: Recent Labs  Lab 05/23/21 1901  CKTOTAL 37*     HbA1C: Hgb A1c MFr Bld  Date/Time Value Ref Range Status  05/23/2021 03:40 PM 8.1 (H) 4.8 - 5.6 % Final    Comment:    (NOTE) Pre diabetes:          5.7%-6.4%  Diabetes:              >6.4%  Glycemic control for   <7.0% adults with diabetes   05/01/2021 05:15 AM 7.7 (H) 4.8 - 5.6 % Final    Comment:    (NOTE)  Prediabetes: 5.7 - 6.4         Diabetes: >6.4         Glycemic control for adults with diabetes: <7.0     CBG: Recent Labs  Lab 05/29/21 1727 05/29/21 2132 05/30/21 0025 05/30/21 0405 05/30/21 0817  GLUCAP 281* 337* 275* 216* 164*     Recent Results (from the past 240 hour(s))  Culture, Urine     Status: Abnormal   Collection Time: 05/23/21  3:40 PM   Specimen: Urine, Random  Result Value Ref Range Status   Specimen Description   Final    URINE, RANDOM Performed at [facility] [address] [place], [place] [address]    Special Requests   Final    NONE Performed at [facility], [place] [address], [place], [place] [address]    Culture (A)  Final    >=100,000 COLONIES/mL PSEUDOMONAS AERUGINOSA 80,000 COLONIES/mL ENTEROCOCCUS FAECALIS    Report Status 05/27/2021 FINAL  Final   Organism ID, Bacteria PSEUDOMONAS AERUGINOSA (A)  Final   Organism ID, Bacteria ENTEROCOCCUS FAECALIS (A)  Final      Susceptibility   Enterococcus faecalis - MIC*    AMPICILLIN <=2 SENSITIVE Sensitive     NITROFURANTOIN <=16 SENSITIVE Sensitive     VANCOMYCIN 1 SENSITIVE Sensitive     * 80,000 COLONIES/mL ENTEROCOCCUS FAECALIS   Pseudomonas aeruginosa - MIC*    CEFTAZIDIME 4 SENSITIVE Sensitive     CIPROFLOXACIN <=0.25 SENSITIVE Sensitive     GENTAMICIN <=1 SENSITIVE Sensitive     IMIPENEM 2 SENSITIVE Sensitive     PIP/TAZO 8 SENSITIVE Sensitive     CEFEPIME 2 SENSITIVE Sensitive     * >=100,000 COLONIES/mL PSEUDOMONAS AERUGINOSA  Resp Panel by  RT-PCR (Flu A&B, Covid) Urine, Clean Catch     Status: None   Collection Time: 05/23/21  3:41 PM   Specimen: Urine, Clean Catch; Nasopharyngeal(NP) swabs in vial transport medium  Result Value Ref Range Status   SARS Coronavirus 2 by RT PCR NEGATIVE NEGATIVE Final    Comment: (NOTE) SARS-CoV-2 target nucleic acids are NOT DETECTED.  The SARS-CoV-2 RNA is generally detectable in upper respiratory specimens during the acute phase of infection. The lowest concentration of SARS-CoV-2 viral copies this assay can detect is 138 copies/mL. A negative result does not preclude SARS-Cov-2 infection and should not be used as the sole basis for treatment or other patient management decisions. A negative result may occur with  improper specimen collection/handling, submission of specimen other than nasopharyngeal swab, presence of viral mutation(s) within the areas targeted by this assay, and inadequate number of viral copies(<138 copies/mL). A negative result must be combined with clinical observations, patient history, and epidemiological information. The expected result is Negative.  Fact Sheet for Patients:  EntrepreneurPulse.com.au  Fact Sheet for Healthcare Providers:  IncredibleEmployment.be  This test is no t yet approved or cleared by the [place] FDA and  has been authorized for detection and/or diagnosis of SARS-CoV-2 by FDA under an Emergency Use Authorization (EUA). This EUA will remain  in effect (meaning this test can be used) for the duration of the COVID-19 declaration under Section 564(b)(1) of the Act, 21 U.S.C.section 360bbb-3(b)(1), unless the authorization is terminated  or revoked sooner.       Influenza A by PCR NEGATIVE NEGATIVE Final   Influenza B by PCR NEGATIVE NEGATIVE Final    Comment: (NOTE) The Xpert Xpress SARS-CoV-2/FLU/RSV plus assay is intended as an aid in the  diagnosis of influenza from Nasopharyngeal swab  specimens and should not be used as a sole basis for treatment. Nasal washings and aspirates are unacceptable for Xpert Xpress SARS-CoV-2/FLU/RSV testing.  Fact Sheet for Patients: EntrepreneurPulse.com.au  Fact Sheet for Healthcare Providers: IncredibleEmployment.be  This test is not yet approved or cleared by the [place] FDA and has been authorized for detection and/or diagnosis of SARS-CoV-2 by FDA under an Emergency Use Authorization (EUA). This EUA will remain in effect (meaning this test can be used) for the duration of the COVID-19 declaration under Section 564(b)(1) of the Act, 21 U.S.C. section 360bbb-3(b)(1), unless the authorization is terminated or revoked.  Performed at [facility], [place] [address], [place], [place] [address]   Culture, blood (x 2)     Status: Abnormal   Collection Time: 05/23/21  3:43 PM   Specimen: BLOOD  Result Value Ref Range Status   Specimen Description   Final    BLOOD RIGHT ANTECUBITAL Performed at [facility], [place] [address], [place], [place] [address]    Special Requests   Final    BOTTLES DRAWN AEROBIC AND ANAEROBIC Blood Culture adequate volume Performed at [facility] [address], [place], [place] [address]    Culture  Setup Time   Final    GRAM NEGATIVE RODS IN BOTH AEROBIC AND ANAEROBIC BOTTLES CRITICAL RESULT CALLED TO, READ BACK BY AND VERIFIED WITH: PHARM D C.SHADE ON [id number] AT 1703 BY E.PARRISH Performed at [facility], [place] [address], [place], [place] [address]    Culture PSEUDOMONAS AERUGINOSA (A)  Final   Report Status 05/26/2021 FINAL  Final   Organism ID, Bacteria PSEUDOMONAS AERUGINOSA  Final      Susceptibility   Pseudomonas aeruginosa - MIC*    CEFTAZIDIME 4 SENSITIVE Sensitive     CIPROFLOXACIN <=0.25 SENSITIVE Sensitive     GENTAMICIN <=1 SENSITIVE Sensitive     IMIPENEM 2 SENSITIVE Sensitive     PIP/TAZO 8  SENSITIVE Sensitive     CEFEPIME 2 SENSITIVE Sensitive     * PSEUDOMONAS AERUGINOSA  Blood Culture ID Panel (Reflexed)     Status: Abnormal   Collection Time: 05/23/21  3:43 PM  Result Value Ref Range Status   Enterococcus faecalis NOT DETECTED NOT DETECTED Final   Enterococcus Faecium NOT DETECTED NOT DETECTED Final   Listeria monocytogenes NOT DETECTED NOT DETECTED Final   Staphylococcus species NOT DETECTED NOT DETECTED Final   Staphylococcus aureus (BCID) NOT DETECTED NOT DETECTED Final   Staphylococcus epidermidis NOT DETECTED NOT DETECTED Final   Staphylococcus lugdunensis NOT DETECTED NOT DETECTED Final   Streptococcus species NOT DETECTED NOT DETECTED Final   Streptococcus agalactiae NOT DETECTED NOT DETECTED Final   Streptococcus pneumoniae NOT DETECTED NOT DETECTED Final   Streptococcus pyogenes NOT DETECTED NOT DETECTED Final   A.calcoaceticus-baumannii NOT DETECTED NOT DETECTED Final   Bacteroides fragilis NOT DETECTED NOT DETECTED Final   Enterobacterales NOT DETECTED NOT DETECTED Final   Enterobacter cloacae complex NOT DETECTED NOT DETECTED Final   Escherichia coli NOT DETECTED NOT DETECTED Final   Klebsiella aerogenes NOT DETECTED NOT DETECTED Final   Klebsiella oxytoca NOT DETECTED NOT DETECTED Final   Klebsiella pneumoniae NOT DETECTED NOT DETECTED Final   Proteus species NOT DETECTED NOT DETECTED Final   Salmonella species NOT DETECTED NOT DETECTED Final   Serratia marcescens NOT DETECTED NOT DETECTED Final   Haemophilus influenzae NOT DETECTED NOT DETECTED Final   Neisseria meningitidis NOT DETECTED  NOT DETECTED Final   Pseudomonas aeruginosa DETECTED (A) NOT DETECTED Final    Comment: CRITICAL RESULT CALLED TO, READ BACK BY AND VERIFIED WITH: PHARM D C.SHADE ON [id number] AT 1703 BY E.PARRISH    Stenotrophomonas maltophilia NOT DETECTED NOT DETECTED Final   Candida albicans NOT DETECTED NOT DETECTED Final   Candida auris NOT DETECTED NOT DETECTED Final    Candida glabrata NOT DETECTED NOT DETECTED Final   Candida krusei NOT DETECTED NOT DETECTED Final   Candida parapsilosis NOT DETECTED NOT DETECTED Final   Candida tropicalis NOT DETECTED NOT DETECTED Final   Cryptococcus neoformans/gattii NOT DETECTED NOT DETECTED Final   CTX-M ESBL NOT DETECTED NOT DETECTED Final   Carbapenem resistance IMP NOT DETECTED NOT DETECTED Final   Carbapenem resistance KPC NOT DETECTED NOT DETECTED Final   Carbapenem resistance NDM NOT DETECTED NOT DETECTED Final   Carbapenem resistance VIM NOT DETECTED NOT DETECTED Final    Comment: Performed at [facility], [place] [address], [place], [place] [address]  Culture, blood (x 2)     Status: None   Collection Time: 05/23/21  3:45 PM   Specimen: BLOOD LEFT ARM  Result Value Ref Range Status   Specimen Description   Final    BLOOD LEFT ARM Performed at [facility] [address], [place], [place] [address]    Special Requests   Final    BOTTLES DRAWN AEROBIC AND ANAEROBIC Blood Culture adequate volume Performed at [facility] [address] [place], [place] [address]    Culture   Final    NO GROWTH 5 DAYS Performed at [facility], [place] [address], [place], [place] [address]    Report Status 05/28/2021 FINAL  Final  MRSA Next Gen by PCR, Nasal     Status: None   Collection Time: 05/23/21  6:56 PM   Specimen: Nasal Mucosa; Nasal Swab  Result Value Ref Range Status   MRSA by PCR Next Gen NOT DETECTED NOT DETECTED Final    Comment: (NOTE) The GeneXpert MRSA Assay (FDA approved for NASAL specimens only), is one component of a comprehensive MRSA colonization surveillance program. It is not intended to diagnose MRSA infection nor to guide or monitor treatment for MRSA infections. Test performance is not FDA approved in patients less than 72 years old. Performed at [facility], [place] [address] [place], [place] [address]   Culture, blood  (routine x 2)     Status: None (Preliminary result)   Collection Time: 05/25/21  9:08 AM   Specimen: BLOOD  Result Value Ref Range Status   Specimen Description   Final    BLOOD BLOOD RIGHT FOREARM Performed at [facility] [address] [place], [place] [address]    Special Requests   Final    BOTTLES DRAWN AEROBIC AND ANAEROBIC Blood Culture adequate volume Performed at [facility] [address] [place], [place] [address]    Culture   Final    NO GROWTH 4 DAYS Performed at [facility], [place] [address] [place],  [address]    Report Status PENDING  Incomplete  Culture, blood (routine x 2)     Status: None (Preliminary result)   Collection Time: 05/25/21  9:08 AM   Specimen: BLOOD  Result Value Ref Range Status   Specimen Description   Final    BLOOD LEFT ANTECUBITAL Performed at [facility] [address] [place],  [place] [address]    Special Requests   Final    BOTTLES DRAWN AEROBIC AND ANAEROBIC Blood Culture results may not be optimal due to an inadequate volume of blood received in culture bottles Performed at [facility], [place] [address], [place], [place] [address]    Culture   Final    NO GROWTH 4 DAYS Performed at [facility], [place] [address] [place], [place] [address]    Report Status PENDING  Incomplete      Scheduled Meds:  amiodarone  200 mg Oral q morning   apixaban  5 mg Oral BID   atorvastatin  20 mg Oral QHS   Chlorhexidine Gluconate Cloth  6 each Topical Daily   clopidogrel  75 mg Oral Daily   finasteride  5 mg Oral QHS   insulin aspart  0-9 Units Subcutaneous Q4H   metoprolol tartrate  50 mg Oral BID   tamsulosin  0.4 mg Oral QPC supper   Continuous Infusions:  piperacillin-tazobactam (ZOSYN)  IV 3.375 g (05/30/21 0156)     LOS: 7 days   Cherene Altes, MD [facility] Office  [phone] Pager - Text Page per Shea Evans  If  7PM-7AM, please contact night-coverage per Amion 05/30/2021, 8:29 AM

## 2021-05-31 ENCOUNTER — Encounter (HOSPITAL_COMMUNITY): Payer: Self-pay | Admitting: Internal Medicine

## 2021-05-31 LAB — GLUCOSE, CAPILLARY
Glucose-Capillary: 193 mg/dL — ABNORMAL HIGH (ref 70–99)
Glucose-Capillary: 235 mg/dL — ABNORMAL HIGH (ref 70–99)
Glucose-Capillary: 247 mg/dL — ABNORMAL HIGH (ref 70–99)
Glucose-Capillary: 318 mg/dL — ABNORMAL HIGH (ref 70–99)

## 2021-05-31 MED ORDER — INSULIN GLARGINE 100 UNIT/ML ~~LOC~~ SOLN
26.0000 [IU] | Freq: Every day | SUBCUTANEOUS | Status: DC
  Administered 2021-05-31: 26 [IU] via SUBCUTANEOUS
  Filled 2021-05-31 (×2): qty 0.26

## 2021-05-31 MED ORDER — PIPERACILLIN-TAZOBACTAM IV (FOR PTA / DISCHARGE USE ONLY): 13.5000 g | INTRAVENOUS | 0 refills | Status: AC

## 2021-05-31 NOTE — Plan of Care (Signed)

## 2021-05-31 NOTE — Progress Notes (Signed)
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 He presented to the ED 7/9 with a fever and was felt to be septic due to a UTI.  Significant Events: 6/17 admit [facility] - cystoscopy w/ R ureteral stent placement ([place]) for R hydronephrosis 6/20 CT guided R retroperitoneal mass bx 6/21 d/c home  6/22 re-admit [facility] fever, dysuria, urinary hesitancy/retention - Pseudomonas UTI 6/26 d/c home  6/29 outpt Onc eval - plan to f/u in 2 weeks to start tx after port and TTE 7/9 re-admit with recurring fever 7/15 TEE at [facility] - no frank veg - <1cm echodensity on mitral valve c/w calcification  7/16 PICC line placed   Consultants:  Infectious Disease Urology Oncology  Code Status: FULL CODE  Antimicrobials:  Cefepime 7/9 > 7/13 Zosyn 7/13 >  DVT prophylaxis: Eliquis  Subjective: I spoke to the patient at length using the iPad interpreter.  No complaints.  He is quite frustrated that he is not able to be discharged home today due to the fact that more time is needed to set up his outpatient antibiotic therapy.  Assessment & Plan:  Complicated UTI with sepsis - Pseudomonas bacteremia CT renal study noted extensive perinephric, periureteral, and perivesicular stranding - Pseudomonas and Enterococcus noted in urine culture - Pseudomonas alone noted in blood culture - suspicion is that of incomplete clearance of recent UTI due to neutropenic state as well as indwelling stent - TEE w/o clear evidence of a vegetation - favor holding off on port placement until antibiotic course completed - PICC line placed 7/16 to facilitate outpt abx  (w/ blessing of ID)  Hydronephrosis of right kidney Status post right ureteral stent placement 6/17 - follow-up with Urology already established  Severe neutropenia WBC stable  Recent Labs  Lab 05/25/21 0338 05/26/21 0334 05/27/21 0338 05/28/21 0320 05/30/21 0248  WBC 2.8* 2.7* 3.0* 3.1* 3.1*     Diffuse large B-cell lymphoma Diagnosed 05/04/2021 via right retroperitoneal lymph node biopsy - followed by Dr. Lorenso Courier with staging still in process - has PET scan scheduled 06/03/2021 - for eventual port placement once bacteremia cleared - for now will have PICC in place   Acute renal failure on CKD stage IIIa Baseline creatinine approximately 1.4 - acute worsening felt to be due to mass-effect related to retroperitoneal pelvic adenopathy - now improved to his baseline   Recent Labs  Lab 05/25/21 0338 05/26/21 0334 05/27/21 0338 05/28/21 0320 05/30/21 0248  CREATININE 1.63* 1.61* 1.58* 1.42* 1.41*    Normocytic anemia Due to cancer as well as CKD -hemoglobin stable at approximately 8.5  Recent Labs  Lab 05/25/21 0338 05/26/21 0334 05/27/21 0338 05/28/21 0320 05/30/21 0248  HGB 8.7* 8.5* 8.6* 8.7* 8.8*      Prolonged QT interval  Hypomagnesemia Corrected with supplementation  Chronic paroxysmal atrial fibrillation Continue amiodarone and Eliquis - in NSR at present   CAD Status post PCI to RCA January 2022 - continue statin, Plavix, Eliquis - asymptomatic   DM2 CBG trending upward -adjust treatment and follow  HLD  Sleep apnea Continue nightly CPAP per home regimen  BPH   Family Communication:  Status is:  Inpatient  Remains inpatient appropriate because:Inpatient level of care appropriate due to severity of illness  Dispo: The patient is from: Home              Anticipated d/c is to: Home              Patient currently is not medically stable to d/c.   Difficult to place patient No   Objective: Blood pressure (!) 146/76, pulse (!) 57,  temperature 98.2 F (36.8 C), temperature source Oral, resp. rate 20, height 5\' 10"  (1.778 m), weight 112.3 kg, SpO2 99 %.  Intake/Output Summary (Last 24 hours) at 05/31/2021 1030 Last data filed at 05/31/2021 0610 Gross per 24 hour  Intake 290 ml  Output 3625 ml  Net -3335 ml    Filed Weights   05/26/21 0200 05/29/21 1110 05/30/21 0533  Weight: 116.8 kg 113.4 kg 112.3 kg    Examination: General: No acute respiratory distress Lungs: CTA B without wheezing Cardiovascular: RRR Abdomen: NT/ND, soft, BS positive Extremities: No significant edema B LE   CBC: Recent Labs  Lab 05/25/21 0338 05/26/21 0334 05/27/21 0338 05/28/21 0320 05/30/21 0248  WBC 2.8* 2.7* 3.0* 3.1* 3.1*  NEUTROABS 0.4* 0.5* 0.2*  --   --   HGB 8.7* 8.5* 8.6* 8.7* 8.8*  HCT 27.2* 27.2* 27.7* 28.2* 28.5*  MCV 82.2 83.2 83.4 83.7 83.1  PLT 130* 147* 187 170 353    Basic Metabolic Panel: Recent Labs  Lab 05/26/21 0334 05/27/21 0338 05/28/21 0320 05/30/21 0248  NA 140 142 140 140  K 3.5 3.4* 3.6 3.5  CL 109 110 106 105  CO2 25 27 25 29   GLUCOSE 137* 116* 185* 254*  BUN 26* 21 18 18   CREATININE 1.61* 1.58* 1.42* 1.41*  CALCIUM 8.2* 8.1* 8.2* 8.3*  MG 1.8 1.9 1.7  --     GFR: Estimated Creatinine Clearance: 58.5 mL/min (A) (by C-G formula based on SCr of 1.41 mg/dL (H)).    HbA1C: Hgb A1c MFr Bld  Date/Time Value Ref Range Status  05/23/2021 03:40 PM 8.1 (H) 4.8 - 5.6 % Final    Comment:    (NOTE) Pre diabetes:          5.7%-6.4%  Diabetes:              >6.4%  Glycemic control for   <7.0% adults with diabetes   05/01/2021 05:15 AM 7.7 (H) 4.8 - 5.6 % Final    Comment:    (NOTE)         Prediabetes: 5.7 - 6.4         Diabetes: >6.4         Glycemic control for adults with diabetes: <7.0     CBG: Recent Labs  Lab 05/30/21 0817 05/30/21 1120 05/30/21 1634 05/30/21 2021 05/31/21 0802  GLUCAP 164* 291* 240* 280* 193*     Recent Results (from the past 240 hour(s))   Culture, Urine     Status: Abnormal   Collection Time: 05/23/21  3:40 PM   Specimen: Urine, Random  Result Value Ref Range Status   Specimen Description   Final    URINE, RANDOM Performed at [facility] [address] [place], [place] [address]    Special Requests   Final    NONE Performed at [facility], [place] [address] [place], [place] [address]    Culture (A)  Final    >=100,000 COLONIES/mL PSEUDOMONAS AERUGINOSA 80,000 COLONIES/mL ENTEROCOCCUS FAECALIS  Report Status 05/27/2021 FINAL  Final   Organism ID, Bacteria PSEUDOMONAS AERUGINOSA (A)  Final   Organism ID, Bacteria ENTEROCOCCUS FAECALIS (A)  Final      Susceptibility   Enterococcus faecalis - MIC*    AMPICILLIN <=2 SENSITIVE Sensitive     NITROFURANTOIN <=16 SENSITIVE Sensitive     VANCOMYCIN 1 SENSITIVE Sensitive     * 80,000 COLONIES/mL ENTEROCOCCUS FAECALIS   Pseudomonas aeruginosa - MIC*    CEFTAZIDIME 4 SENSITIVE Sensitive     CIPROFLOXACIN <=0.25 SENSITIVE Sensitive     GENTAMICIN <=1 SENSITIVE Sensitive     IMIPENEM 2 SENSITIVE Sensitive     PIP/TAZO 8 SENSITIVE Sensitive     CEFEPIME 2 SENSITIVE Sensitive     * >=100,000 COLONIES/mL PSEUDOMONAS AERUGINOSA  Resp Panel by RT-PCR (Flu A&B, Covid) Urine, Clean Catch     Status: None   Collection Time: 05/23/21  3:41 PM   Specimen: Urine, Clean Catch; Nasopharyngeal(NP) swabs in vial transport medium  Result Value Ref Range Status   SARS Coronavirus 2 by RT PCR NEGATIVE NEGATIVE Final    Comment: (NOTE) SARS-CoV-2 target nucleic acids are NOT DETECTED.  The SARS-CoV-2 RNA is generally detectable in upper respiratory specimens during the acute phase of infection. The lowest concentration of SARS-CoV-2 viral copies this assay can detect is 138 copies/mL. A negative result does not preclude SARS-Cov-2 infection and should not be used as the sole basis for treatment or other patient management decisions. A negative  result may occur with  improper specimen collection/handling, submission of specimen other than nasopharyngeal swab, presence of viral mutation(s) within the areas targeted by this assay, and inadequate number of viral copies(<138 copies/mL). A negative result must be combined with clinical observations, patient history, and epidemiological information. The expected result is Negative.  Fact Sheet for Patients:  EntrepreneurPulse.com.au  Fact Sheet for Healthcare Providers:  IncredibleEmployment.be  This test is no t yet approved or cleared by the [place] FDA and  has been authorized for detection and/or diagnosis of SARS-CoV-2 by FDA under an Emergency Use Authorization (EUA). This EUA will remain  in effect (meaning this test can be used) for the duration of the COVID-19 declaration under Section 564(b)(1) of the Act, 21 U.S.C.section 360bbb-3(b)(1), unless the authorization is terminated  or revoked sooner.       Influenza A by PCR NEGATIVE NEGATIVE Final   Influenza B by PCR NEGATIVE NEGATIVE Final    Comment: (NOTE) The Xpert Xpress SARS-CoV-2/FLU/RSV plus assay is intended as an aid in the diagnosis of influenza from Nasopharyngeal swab specimens and should not be used as a sole basis for treatment. Nasal washings and aspirates are unacceptable for Xpert Xpress SARS-CoV-2/FLU/RSV testing.  Fact Sheet for Patients: EntrepreneurPulse.com.au  Fact Sheet for Healthcare Providers: IncredibleEmployment.be  This test is not yet approved or cleared by the [place] FDA and has been authorized for detection and/or diagnosis of SARS-CoV-2 by FDA under an Emergency Use Authorization (EUA). This EUA will remain in effect (meaning this test can be used) for the duration of the COVID-19 declaration under Section 564(b)(1) of the Act, 21 U.S.C. section 360bbb-3(b)(1), unless the authorization is  terminated or revoked.  Performed at [facility], [place] [address], [place], [place] [address]   Culture, blood (x 2)     Status: Abnormal   Collection Time: 05/23/21  3:43 PM   Specimen: BLOOD  Result Value Ref Range Status   Specimen Description   Final  BLOOD RIGHT ANTECUBITAL Performed at [facility], [place] [address], [place], [place] [address]    Special Requests   Final    BOTTLES DRAWN AEROBIC AND ANAEROBIC Blood Culture adequate volume Performed at [place] [facility] [address] [place], [place] [address]    Culture  Setup Time   Final    GRAM NEGATIVE RODS IN BOTH AEROBIC AND ANAEROBIC BOTTLES CRITICAL RESULT CALLED TO, READ BACK BY AND VERIFIED WITH: PHARM D C.SHADE ON [id number] AT 1703 BY E.PARRISH Performed at [facility], [place] [address], [place], [place] [address]    Culture PSEUDOMONAS AERUGINOSA (A)  Final   Report Status 05/26/2021 FINAL  Final   Organism ID, Bacteria PSEUDOMONAS AERUGINOSA  Final      Susceptibility   Pseudomonas aeruginosa - MIC*    CEFTAZIDIME 4 SENSITIVE Sensitive     CIPROFLOXACIN <=0.25 SENSITIVE Sensitive     GENTAMICIN <=1 SENSITIVE Sensitive     IMIPENEM 2 SENSITIVE Sensitive     PIP/TAZO 8 SENSITIVE Sensitive     CEFEPIME 2 SENSITIVE Sensitive     * PSEUDOMONAS AERUGINOSA  Blood Culture ID Panel (Reflexed)     Status: Abnormal   Collection Time: 05/23/21  3:43 PM  Result Value Ref Range Status   Enterococcus faecalis NOT DETECTED NOT DETECTED Final   Enterococcus Faecium NOT DETECTED NOT DETECTED Final   Listeria monocytogenes NOT DETECTED NOT DETECTED Final   Staphylococcus species NOT DETECTED NOT DETECTED Final   Staphylococcus aureus (BCID) NOT DETECTED NOT DETECTED Final   Staphylococcus epidermidis NOT DETECTED NOT DETECTED Final   Staphylococcus lugdunensis NOT DETECTED NOT DETECTED Final   Streptococcus species NOT DETECTED NOT DETECTED Final   Streptococcus agalactiae  NOT DETECTED NOT DETECTED Final   Streptococcus pneumoniae NOT DETECTED NOT DETECTED Final   Streptococcus pyogenes NOT DETECTED NOT DETECTED Final   A.calcoaceticus-baumannii NOT DETECTED NOT DETECTED Final   Bacteroides fragilis NOT DETECTED NOT DETECTED Final   Enterobacterales NOT DETECTED NOT DETECTED Final   Enterobacter cloacae complex NOT DETECTED NOT DETECTED Final   Escherichia coli NOT DETECTED NOT DETECTED Final   Klebsiella aerogenes NOT DETECTED NOT DETECTED Final   Klebsiella oxytoca NOT DETECTED NOT DETECTED Final   Klebsiella pneumoniae NOT DETECTED NOT DETECTED Final   Proteus species NOT DETECTED NOT DETECTED Final   Salmonella species NOT DETECTED NOT DETECTED Final   Serratia marcescens NOT DETECTED NOT DETECTED Final   Haemophilus influenzae NOT DETECTED NOT DETECTED Final   Neisseria meningitidis NOT DETECTED NOT DETECTED Final   Pseudomonas aeruginosa DETECTED (A) NOT DETECTED Final    Comment: CRITICAL RESULT CALLED TO, READ BACK BY AND VERIFIED WITH: PHARM D C.SHADE ON [id number] AT 1703 BY E.PARRISH    Stenotrophomonas maltophilia NOT DETECTED NOT DETECTED Final   Candida albicans NOT DETECTED NOT DETECTED Final   Candida auris NOT DETECTED NOT DETECTED Final   Candida glabrata NOT DETECTED NOT DETECTED Final   Candida krusei NOT DETECTED NOT DETECTED Final   Candida parapsilosis NOT DETECTED NOT DETECTED Final   Candida tropicalis NOT DETECTED NOT DETECTED Final   Cryptococcus neoformans/gattii NOT DETECTED NOT DETECTED Final   CTX-M ESBL NOT DETECTED NOT DETECTED Final   Carbapenem resistance IMP NOT DETECTED NOT DETECTED Final   Carbapenem resistance KPC NOT DETECTED NOT DETECTED Final   Carbapenem resistance NDM NOT DETECTED NOT DETECTED Final   Carbapenem resistance VIM NOT DETECTED NOT DETECTED Final    Comment: Performed at [facility], [place] [address] [place], [place] [address]  Culture, blood (x 2)     Status: None   Collection Time:  05/23/21  3:45 PM   Specimen: BLOOD LEFT ARM  Result Value Ref Range Status   Specimen Description   Final    BLOOD LEFT ARM Performed at [facility] [address] [place], [place] [address]    Special Requests   Final    BOTTLES DRAWN AEROBIC AND ANAEROBIC Blood Culture adequate volume Performed at [facility] [address] [place], [place] [address]    Culture   Final    NO GROWTH 5 DAYS Performed at [facility], [place] [address], [place], [place] [address]    Report Status 05/28/2021 FINAL  Final  MRSA Next Gen by PCR, Nasal     Status: None   Collection Time: 05/23/21  6:56 PM   Specimen: Nasal Mucosa; Nasal Swab  Result Value Ref Range Status   MRSA by PCR Next Gen NOT DETECTED NOT DETECTED Final    Comment: (NOTE) The GeneXpert MRSA Assay (FDA approved for NASAL specimens only), is one component of a comprehensive MRSA colonization surveillance program. It is not intended to diagnose MRSA infection nor to guide or monitor treatment for MRSA infections. Test performance is not FDA approved in patients less than 74 years old. Performed at [facility], [place] [address], [place], [place] [address]   Culture, blood (routine x 2)     Status: None   Collection Time: 05/25/21  9:08 AM   Specimen: BLOOD  Result Value Ref Range Status   Specimen Description   Final    BLOOD BLOOD RIGHT FOREARM Performed at [facility] [address], [place], [place] [address]    Special Requests   Final    BOTTLES DRAWN AEROBIC AND ANAEROBIC Blood Culture adequate volume Performed at [facility] [address] [place], [place] [address]    Culture   Final    NO GROWTH 5 DAYS Performed at [facility], [place] [address] [place], [place] [address]    Report Status 05/30/2021 FINAL  Final  Culture, blood (routine x 2)     Status: None   Collection Time: 05/25/21  9:08 AM    Specimen: BLOOD  Result Value Ref Range Status   Specimen Description   Final    BLOOD LEFT ANTECUBITAL Performed at [facility] [address], [place], [place] [address]    Special Requests   Final    BOTTLES DRAWN AEROBIC AND ANAEROBIC Blood Culture results may not be optimal due to an inadequate volume of blood received in culture bottles Performed at [facility] [address], [place], [place] [address]    Culture   Final    NO GROWTH 5 DAYS Performed at [facility], [place] [address], [place], [place] [address]    Report Status 05/30/2021 FINAL  Final      Scheduled Meds:  amiodarone  200 mg Oral q morning   apixaban  5 mg Oral BID   atorvastatin  20 mg Oral QHS   Chlorhexidine Gluconate Cloth  6 each Topical Daily   clopidogrel  75 mg Oral Daily   finasteride  5 mg Oral QHS   insulin aspart  0-5 Units Subcutaneous QHS   insulin aspart  0-9 Units Subcutaneous TID WC   insulin glargine  20 Units  Subcutaneous QHS   metoprolol tartrate  50 mg Oral BID   polyethylene glycol  17 g Oral Daily   sodium chloride flush  10-40 mL Intracatheter Q12H   tamsulosin  0.4 mg Oral QPC supper   Continuous Infusions:  piperacillin-tazobactam (ZOSYN)  IV 3.375 g (05/31/21 0934)     LOS: 8 days   Cherene Altes, MD [facility] Office  [phone] Pager - Text Page per Shea Evans  If 7PM-7AM, please contact night-coverage per Amion 05/31/2021, 10:30 AM

## 2021-05-31 NOTE — TOC Progression Note (Signed)
Transition of Care [facility]) - Progression Note    Patient Details  Name: [name] MRN: [id number] Date of Birth: 12/07/1945  Transition of Care [facility]) CM/SW Contact  Kaitlyn Skowron, Juliann Pulse, RN Phone Number: 05/31/2021, 10:41 AM  Clinical Narrative:  contacted severl [place] agencies for [facility] iv abx-Not able to accept d/t either not in network,or unable to staff except-[facility] rep cory able to start services on Tuesday/[facility] rep Pam will start instruction on Monday with dtr. Will need HHRN iv abx order-picc flush,labs per protocal,OPAT order to be signed. Continue to follow for d/c needs.     Expected Discharge Plan: [place] Barriers to Discharge: Continued Medical Work up  Expected Discharge Plan and Services Expected Discharge Plan: [place]   Discharge Planning Services: CM Consult                               HH Arranged: RN, IV Antibiotics HH Agency: [place] Date [place]: 05/31/21 Time [place]: 21 Representative spoke with at [place]: [place] ([place]) Interventions    Readmission Risk Interventions No flowsheet data found.

## 2021-06-01 ENCOUNTER — Inpatient Hospital Stay (HOSPITAL_COMMUNITY)
Admission: RE | Admit: 2021-06-01 | Discharge: 2021-06-01 | Disposition: A | Discharge status: Home / Self Care | Payer: Medicare HMO | Source: Ambulatory Visit | Attending: Hematology and Oncology | Admitting: Hematology and Oncology

## 2021-06-01 ENCOUNTER — Inpatient Hospital Stay (HOSPITAL_COMMUNITY): Payer: Medicare HMO

## 2021-06-01 DIAGNOSIS — Z0189 Encounter for other specified special examinations: Secondary | ICD-10-CM

## 2021-06-01 LAB — CBC
HCT: 29.2 % — ABNORMAL LOW (ref 39.0–52.0)
Hemoglobin: 9.2 g/dL — ABNORMAL LOW (ref 13.0–17.0)
MCH: 25.9 pg — ABNORMAL LOW (ref 26.0–34.0)
MCHC: 31.5 g/dL (ref 30.0–36.0)
MCV: 82.3 fL (ref 80.0–100.0)
Platelets: 188 10*3/uL (ref 150–400)
RBC: 3.55 MIL/uL — ABNORMAL LOW (ref 4.22–5.81)
RDW: 15.9 % — ABNORMAL HIGH (ref 11.5–15.5)
WBC: 3.4 10*3/uL — ABNORMAL LOW (ref 4.0–10.5)
nRBC: 0 % (ref 0.0–0.2)

## 2021-06-01 LAB — ECHOCARDIOGRAM COMPLETE
AR max vel: 1.65 cm2
AV Area VTI: 1.73 cm2
AV Area mean vel: 1.69 cm2
AV Mean grad: 7 mmHg
AV Peak grad: 13.7 mmHg
Ao pk vel: 1.85 m/s
Area-P 1/2: 2.34 cm2
Height: 70 in
S' Lateral: 4.3 cm
Weight: 3961.23 oz

## 2021-06-01 LAB — GLUCOSE, CAPILLARY
Glucose-Capillary: 233 mg/dL — ABNORMAL HIGH (ref 70–99)
Glucose-Capillary: 280 mg/dL — ABNORMAL HIGH (ref 70–99)
Glucose-Capillary: 287 mg/dL — ABNORMAL HIGH (ref 70–99)

## 2021-06-01 NOTE — Discharge Summary (Signed)
DISCHARGE SUMMARY  [name]  MR#: [id number]  DOB:24-Jan-1946  Date of Admission: 05/23/2021 Date of Discharge: 06/01/2021  Attending Physician:Lailie Smead Hennie Duos, MD  Patient's PIR:[name], [name], MD  Consults: Infectious Disease Urology Oncology  Disposition: D/C home    Follow-up Appts:  Follow-up Information     [name], MD Follow up on 06/24/2021.   Specialty: Urology Why: at 11 AM Contact information: [address] [place] [address] [phone]         Care, [facility] Follow up.   Specialty: Home Health Services Why: [facility] nursing-iv abx Contact information: [place] [address] [phone]         [facility] Follow up.   Why: iv abx med/supplies Contact information: [place]        Radiontchenko, Alexei, MD Follow up in 1 week(s).   Specialty: Family Medicine Contact information: [address] 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 He presented to the ED 7/9 with a fever and was felt to be septic due to a UTI.  Hospital Course: 6/17 admit  [facility] - cystoscopy w/ R ureteral stent placement ([place]) for R hydronephrosis 6/20 CT guided R retroperitoneal mass bx 6/21 d/c home 6/22 re-admit [facility] fever, dysuria, urinary hesitancy/retention - Pseudomonas UTI 6/26 d/c home 6/29 outpt Onc eval - plan to f/u in 2 weeks to start tx after port and TTE 7/9 re-admit with recurring fever 7/15 TEE at [facility] - no frank veg - <1cm echodensity on mitral valve c/w calcification 7/16 PICC line placed  7/18 foley cath removed - pt able to urinate w/o it   Complicated UTI with sepsis - Pseudomonas bacteremia CT renal study noted extensive perinephric, periureteral, and perivesicular stranding - Pseudomonas and Enterococcus noted in urine culture - Pseudomonas alone noted in blood culture - suspicion is that of incomplete clearance of recent UTI due to neutropenic state as well as indwelling stent - TEE w/o clear evidence of a vegetation but abnormality noted on mitral valve w/ no prior TTE for comparison - favor holding off on port placement until antibiotic course completed - PICC line placed 7/16 to facilitate outpt abx (w/ blessing of ID) - ID has suggested tx empirically for endocarditis given TEE findings - home abx infusion arranged and to be followed in ID clinic    Hydronephrosis of right kidney Status post right ureteral stent placement 6/17 - follow-up with Urology already established - pt greatly desired d/c of foley cath prior to d/c - pt was thankfully able to urinate freely after his foley was discontinued    Severe neutropenia WBC stable at time of d/c at ~3.4  Diffuse large B-cell lymphoma Diagnosed 05/04/2021 via right retroperitoneal lymph node biopsy - followed by Dr. Lorenso Courier with staging still in process - has PET scan scheduled 06/03/2021 - for eventual port placement once bacteremia cleared - Onc to f/u in outpt setting to determine timing of chemo tx    Acute renal failure on CKD stage IIIa Baseline creatinine approximately 1.4 - acute  worsening felt to be due to mass-effect related to retroperitoneal pelvic adenopathy - now improved to his baseline and stable at time of d/c   Normocytic anemia Due to cancer as well as CKD -hemoglobin stable at approximately 8.5 prior to d/c   Prolonged QT interval   Hypomagnesemia Corrected with supplementation   Chronic paroxysmal atrial fibrillation Continue amiodarone and Eliquis - in NSR at time of d/c    CAD Status post PCI to RCA January 2022 - continue statin, Plavix, Eliquis - asymptomatic during admission    DM2 CBG variable but not yet at goal at time of d/c - insulin adjusted - will need outpt f/u of CBGs and possible further titration of insulin tx    HLD   Sleep apnea Continue nightly CPAP per home regimen   BPH Voiding w/o difficulty at time of d/c    Allergies as of 06/01/2021   No Known Allergies      Medication List     STOP taking these medications    ofloxacin 0.3 % ophthalmic solution Commonly known as: OCUFLOX       TAKE these medications    acetaminophen 650 MG CR tablet Commonly known as: TYLENOL Take 650 mg by mouth every 8 (eight) hours as needed for pain or fever (headache).   amiodarone 200 MG tablet Commonly known as: PACERONE Take 200 mg by mouth every morning.   amLODipine 10 MG tablet Commonly known as: NORVASC Take 1 tablet (10 mg total) by mouth daily. What changed: when to take this   apixaban 5 MG Tabs tablet Commonly known as: ELIQUIS Take 5 mg by mouth 2 (two) times daily.   atorvastatin 20 MG tablet Commonly known as: LIPITOR Take 20 mg by mouth at bedtime.   clopidogrel 75 MG tablet Commonly known as: PLAVIX Take 75 mg by mouth every morning.   finasteride 5 MG tablet Commonly known as: PROSCAR Take 5 mg by mouth at bedtime.   glipiZIDE 10 MG tablet Commonly known as: GLUCOTROL Take 10 mg by mouth 2 (two) times daily.   insulin glargine 100 unit/mL Sopn Commonly known as: LANTUS Inject 34 Units  into the skin at bedtime.   isosorbide mononitrate 30 MG 24 hr tablet Commonly known as: IMDUR Take 30 mg by mouth every morning.   metFORMIN 1000 MG tablet Commonly known as: GLUCOPHAGE Take 1,000 mg by mouth 2 (two) times daily.   metoprolol tartrate 50 MG tablet Commonly known as: LOPRESSOR Take 1 tablet (50 mg total) by mouth 2 (two) times daily.   nitroGLYCERIN 0.3 MG SL tablet Commonly known as: NITROSTAT Place 0.3 mg under the tongue every 5 (five) minutes x 3 doses as needed for chest pain.   piperacillin-tazobactam  IVPB Commonly known as: ZOSYN Inject 13.5 g into the vein daily. As a continuous infusion Indication:  pseudomonas bacteremia with possible endocarditis First Dose: Yes Last Day of Therapy:  07/08/21 Labs - Once weekly:  CBC/D and BMP, Labs - Every other week:  ESR and CRP Method of administration: Elastomeric (Continuous infusion) Method of administration may be changed at the discretion  of home infusion pharmacist based upon assessment of the patient and/or caregiver's ability to self-administer the medication ordered.   silodosin 8 MG Caps capsule Commonly known as: RAPAFLO Take 8 mg by mouth at bedtime.               Discharge Care Instructions  (From admission, onward)           Start     Ordered   05/31/21 0000  Change dressing on IV access line weekly and PRN  (Home infusion instructions - [facility] )        05/31/21 1135            Day of Discharge BP (!) 155/72 (BP Location: Left Arm)   Pulse (!) 56   Temp 98.3 F (36.8 C) (Oral)   Resp 14   Ht _0  (1.778 m)   Wt 112.3 kg   SpO2 98%   BMI 35.52 kg/m   Physical Exam: General: No acute respiratory distress Lungs: Clear to auscultation bilaterally without wheezes or crackles Cardiovascular: Regular rate and rhythm without murmur gallop or rub normal S1 and S2 Abdomen: Nontender, nondistended, soft, bowel sounds positive, no rebound, no ascites, no  appreciable mass Extremities: No significant cyanosis, clubbing, or edema bilateral lower extremities - R UE PICC w/o erythema or d/c at insertion site   Basic Metabolic Panel: Recent Labs  Lab 05/26/21 0334 05/27/21 0338 05/28/21 0320 05/30/21 0248  NA 140 142 140 140  K 3.5 3.4* 3.6 3.5  CL 109 110 106 105  CO2 _1 GLUCOSE 137* 116* 185* 254*  BUN 26* _2 CREATININE 1.61* 1.58* 1.42* 1.41*  CALCIUM 8.2* 8.1* 8.2* 8.3*  MG 1.8 1.9 1.7  --     CBC: Recent Labs  Lab 05/26/21 0334 05/27/21 0338 05/28/21 0320 05/30/21 0248 06/01/21 0345  WBC 2.7* 3.0* 3.1* 3.1* 3.4*  NEUTROABS 0.5* 0.2*  --   --   --   HGB 8.5* 8.6* 8.7* 8.8* 9.2*  HCT 27.2* 27.7* 28.2* 28.5* 29.2*  MCV 83.2 83.4 83.7 83.1 82.3  PLT 147* 187 170 202 188    CBG: Recent Labs  Lab 05/31/21 1635 05/31/21 2154 06/01/21 0737 06/01/21 1107 06/01/21 1615  GLUCAP 247* 318* 233* 280* 287*    Recent Results (from the past 240 hour(s))  Culture, Urine     Status: Abnormal   Collection Time: 05/23/21  3:40 PM   Specimen: Urine, Random  Result Value Ref Range Status   Specimen Description   Final    URINE, RANDOM Performed at [facility], [place] [address] [place], [place] [address]    Special Requests   Final    NONE Performed at [facility] [place] [address] [place],  [address]    Culture (A)  Final    >=100,000 COLONIES/mL PSEUDOMONAS AERUGINOSA 80,000 COLONIES/mL ENTEROCOCCUS FAECALIS    Report Status 05/27/2021 FINAL  Final   Organism ID, Bacteria PSEUDOMONAS AERUGINOSA (A)  Final   Organism ID, Bacteria ENTEROCOCCUS FAECALIS (A)  Final      Susceptibility   Enterococcus faecalis - MIC*    AMPICILLIN <=2 SENSITIVE Sensitive     NITROFURANTOIN <=16 SENSITIVE Sensitive     VANCOMYCIN 1 SENSITIVE Sensitive     * 80,000 COLONIES/mL ENTEROCOCCUS FAECALIS   Pseudomonas aeruginosa - MIC*    CEFTAZIDIME 4 SENSITIVE Sensitive      CIPROFLOXACIN <=0.25 SENSITIVE Sensitive  GENTAMICIN <=1 SENSITIVE Sensitive     IMIPENEM 2 SENSITIVE Sensitive     PIP/TAZO 8 SENSITIVE Sensitive     CEFEPIME 2 SENSITIVE Sensitive     * >=100,000 COLONIES/mL PSEUDOMONAS AERUGINOSA  Resp Panel by RT-PCR (Flu A&B, Covid) Urine, Clean Catch     Status: None   Collection Time: 05/23/21  3:41 PM   Specimen: Urine, Clean Catch; Nasopharyngeal(NP) swabs in vial transport medium  Result Value Ref Range Status   SARS Coronavirus 2 by RT PCR NEGATIVE NEGATIVE Final    Comment: (NOTE) SARS-CoV-2 target nucleic acids are NOT DETECTED.  The SARS-CoV-2 RNA is generally detectable in upper respiratory specimens during the acute phase of infection. The lowest concentration of SARS-CoV-2 viral copies this assay can detect is 138 copies/mL. A negative result does not preclude SARS-Cov-2 infection and should not be used as the sole basis for treatment or other patient management decisions. A negative result may occur with  improper specimen collection/handling, submission of specimen other than nasopharyngeal swab, presence of viral mutation(s) within the areas targeted by this assay, and inadequate number of viral copies(<138 copies/mL). A negative result must be combined with clinical observations, patient history, and epidemiological information. The expected result is Negative.  Fact Sheet for Patients:  EntrepreneurPulse.com.au  Fact Sheet for Healthcare Providers:  IncredibleEmployment.be  This test is no t yet approved or cleared by the [place] FDA and  has been authorized for detection and/or diagnosis of SARS-CoV-2 by FDA under an Emergency Use Authorization (EUA). This EUA will remain  in effect (meaning this test can be used) for the duration of the COVID-19 declaration under Section 564(b)(1) of the Act, 21 U.S.C.section 360bbb-3(b)(1), unless the authorization is terminated  or revoked  sooner.       Influenza A by PCR NEGATIVE NEGATIVE Final   Influenza B by PCR NEGATIVE NEGATIVE Final    Comment: (NOTE) The Xpert Xpress SARS-CoV-2/FLU/RSV plus assay is intended as an aid in the diagnosis of influenza from Nasopharyngeal swab specimens and should not be used as a sole basis for treatment. Nasal washings and aspirates are unacceptable for Xpert Xpress SARS-CoV-2/FLU/RSV testing.  Fact Sheet for Patients: EntrepreneurPulse.com.au  Fact Sheet for Healthcare Providers: IncredibleEmployment.be  This test is not yet approved or cleared by the [place] FDA and has been authorized for detection and/or diagnosis of SARS-CoV-2 by FDA under an Emergency Use Authorization (EUA). This EUA will remain in effect (meaning this test can be used) for the duration of the COVID-19 declaration under Section 564(b)(1) of the Act, 21 U.S.C. section 360bbb-3(b)(1), unless the authorization is terminated or revoked.  Performed at [facility], [place] [address] [place], [place] [address]   Culture, blood (x 2)     Status: Abnormal   Collection Time: 05/23/21  3:43 PM   Specimen: BLOOD  Result Value Ref Range Status   Specimen Description   Final    BLOOD RIGHT ANTECUBITAL Performed at [facility], [place] [address] [place], [place] [address]    Special Requests   Final    BOTTLES DRAWN AEROBIC AND ANAEROBIC Blood Culture adequate volume Performed at [place] [address] [place], [place] [address]    Culture  Setup Time   Final    GRAM NEGATIVE RODS IN BOTH AEROBIC AND ANAEROBIC BOTTLES CRITICAL RESULT CALLED TO, READ BACK BY AND VERIFIED WITH: PHARM D C.SHADE ON [id number] AT 1703 BY E.PARRISH Performed at [facility],  [address] [place], [place] [address]    Culture PSEUDOMONAS AERUGINOSA (A)  Final   Report Status 05/26/2021 FINAL  Final   Organism ID, Bacteria PSEUDOMONAS  AERUGINOSA  Final      Susceptibility   Pseudomonas aeruginosa - MIC*    CEFTAZIDIME 4 SENSITIVE Sensitive     CIPROFLOXACIN <=0.25 SENSITIVE Sensitive     GENTAMICIN <=1 SENSITIVE Sensitive     IMIPENEM 2 SENSITIVE Sensitive     PIP/TAZO 8 SENSITIVE Sensitive     CEFEPIME 2 SENSITIVE Sensitive     * PSEUDOMONAS AERUGINOSA  Blood Culture ID Panel (Reflexed)     Status: Abnormal   Collection Time: 05/23/21  3:43 PM  Result Value Ref Range Status   Enterococcus faecalis NOT DETECTED NOT DETECTED Final   Enterococcus Faecium NOT DETECTED NOT DETECTED Final   Listeria monocytogenes NOT DETECTED NOT DETECTED Final   Staphylococcus species NOT DETECTED NOT DETECTED Final   Staphylococcus aureus (BCID) NOT DETECTED NOT DETECTED Final   Staphylococcus epidermidis NOT DETECTED NOT DETECTED Final   Staphylococcus lugdunensis NOT DETECTED NOT DETECTED Final   Streptococcus species NOT DETECTED NOT DETECTED Final   Streptococcus agalactiae NOT DETECTED NOT DETECTED Final   Streptococcus pneumoniae NOT DETECTED NOT DETECTED Final   Streptococcus pyogenes NOT DETECTED NOT DETECTED Final   A.calcoaceticus-baumannii NOT DETECTED NOT DETECTED Final   Bacteroides fragilis NOT DETECTED NOT DETECTED Final   Enterobacterales NOT DETECTED NOT DETECTED Final   Enterobacter cloacae complex NOT DETECTED NOT DETECTED Final   Escherichia coli NOT DETECTED NOT DETECTED Final   Klebsiella aerogenes NOT DETECTED NOT DETECTED Final   Klebsiella oxytoca NOT DETECTED NOT DETECTED Final   Klebsiella pneumoniae NOT DETECTED NOT DETECTED Final   Proteus species NOT DETECTED NOT DETECTED Final   Salmonella species NOT DETECTED NOT DETECTED Final   Serratia marcescens NOT DETECTED NOT DETECTED Final   Haemophilus influenzae NOT DETECTED NOT DETECTED Final   Neisseria meningitidis NOT DETECTED NOT DETECTED Final   Pseudomonas aeruginosa DETECTED (A) NOT DETECTED Final    Comment: CRITICAL RESULT CALLED TO, READ BACK  BY AND VERIFIED WITH: PHARM D C.SHADE ON [id number] AT 1703 BY E.PARRISH    Stenotrophomonas maltophilia NOT DETECTED NOT DETECTED Final   Candida albicans NOT DETECTED NOT DETECTED Final   Candida auris NOT DETECTED NOT DETECTED Final   Candida glabrata NOT DETECTED NOT DETECTED Final   Candida krusei NOT DETECTED NOT DETECTED Final   Candida parapsilosis NOT DETECTED NOT DETECTED Final   Candida tropicalis NOT DETECTED NOT DETECTED Final   Cryptococcus neoformans/gattii NOT DETECTED NOT DETECTED Final   CTX-M ESBL NOT DETECTED NOT DETECTED Final   Carbapenem resistance IMP NOT DETECTED NOT DETECTED Final   Carbapenem resistance KPC NOT DETECTED NOT DETECTED Final   Carbapenem resistance NDM NOT DETECTED NOT DETECTED Final   Carbapenem resistance VIM NOT DETECTED NOT DETECTED Final    Comment: Performed at [facility], [place] [address] [place], [place] [address]  Culture, blood (x 2)     Status: None   Collection Time: 05/23/21  3:45 PM   Specimen: BLOOD LEFT ARM  Result Value Ref Range Status   Specimen Description   Final    BLOOD LEFT ARM Performed at [place] [address] [place], [place] [address]    Special Requests   Final    BOTTLES DRAWN AEROBIC AND ANAEROBIC Blood Culture adequate volume Performed at [facility]  [address], [place] [address]    Culture   Final    NO GROWTH 5 DAYS Performed at [facility], [place] [address] [place], [place] [address]    Report Status 05/28/2021 FINAL  Final  MRSA Next Gen by PCR, Nasal     Status: None   Collection Time: 05/23/21  6:56 PM   Specimen: Nasal Mucosa; Nasal Swab  Result Value Ref Range Status   MRSA by PCR Next Gen NOT DETECTED NOT DETECTED Final    Comment: (NOTE) The GeneXpert MRSA Assay (FDA approved for NASAL specimens only), is one component of a comprehensive MRSA colonization surveillance program. It is not intended to diagnose MRSA infection  nor to guide or monitor treatment for MRSA infections. Test performance is not FDA approved in patients less than 85 years old. Performed at [facility], [place] [address], [place], [place] [address]   Culture, blood (routine x 2)     Status: None   Collection Time: 05/25/21  9:08 AM   Specimen: BLOOD  Result Value Ref Range Status   Specimen Description   Final    BLOOD BLOOD RIGHT FOREARM Performed at [place] [address], [place], [place] [address]    Special Requests   Final    BOTTLES DRAWN AEROBIC AND ANAEROBIC Blood Culture adequate volume Performed at [place] [address], [place], [place] [address]    Culture   Final    NO GROWTH 5 DAYS Performed at [facility], [place] [address] [place], [place] [address]    Report Status 05/30/2021 FINAL  Final  Culture, blood (routine x 2)     Status: None   Collection Time: 05/25/21  9:08 AM   Specimen: BLOOD  Result Value Ref Range Status   Specimen Description   Final    BLOOD LEFT ANTECUBITAL Performed at [place] [address], [place], [place], [place] [address]    Special Requests   Final    BOTTLES DRAWN AEROBIC AND ANAEROBIC Blood Culture results may not be optimal due to an inadequate volume of blood received in culture bottles Performed at [place] [address] [place], [place] [address]    Culture   Final    NO GROWTH 5 DAYS Performed at [facility], [place] [address] [place],  [address]    Report Status 05/30/2021 FINAL  Final     Time spent in discharge (includes decision making & examination of pt): 35 minutes  06/01/2021, 4:16 PM   Cherene Altes, MD [facility] Office  [phone]

## 2021-06-01 NOTE — Care Management Important Message (Signed)
Important Message  Patient Details IM Letter given to the Patient. Name: [name] MRN: [id number] Date of Birth: Sep 30, 1946   Medicare Important Message Given:  Yes     Kerin Salen 06/01/2021, 11:29 AM

## 2021-06-01 NOTE — Progress Notes (Signed)
[name]   DOB:June 04, 1946   JJ#:[id number]    ASSESSMENT & PLAN:  Diffuse large B-cell lymphoma, ABC type --known history of DLBCL, plan was to start chemotherapy after PET and Port-A-Cath placement. --Echocardiogram performed 7/18 showed an LVEF of 50 to 55%. --Patient diagnosed with endocarditis this admission with plan for 6 weeks of IV antibiotics. --We will hold on Port-A-Cath placement at this time.  I have reached out to infectious disease to obtain additional guidance on timing of Port-A-Cath placement.  Waiting to hear back. --He currently has a PET scan scheduled for 06/03/2021.  Recommend for him to proceed with a PET scan as scheduled. --He was scheduled for his first cycle of chemotherapy to begin in our office on 06/05/2021.  We will need to delay until he has completed treatment for endocarditis. --Unfortunately, clinically, the lymphadenopathy is causing compressive/obstructive uropathy predispose him to recurrent UTI and sepsis --Continue supportive care --Oncology will continue to monitor.   Possible early sepsis secondary to UTI --Blood and urine cultures positive for Pseudomonas. --Antibiotics per ID.  Dehydration, acute on chronic renal failure --Resolved  Mild pancytopenia, component of anemia of chronic illness --Stable, observe closely for now  Endocarditis -- TEE showed AV lesion that is more likely a calcification. -- ID plans to err on side of treatment for endocarditis with 6 weeks of IV antibiotics. -- Has outpatient follow-up scheduled with ID on 8/4.  Code Status Full code  Goals of care Resolution of clinical sepsis  Discharge planning -- Discharging today with IV antibiotics for 6 weeks for treatment of endocarditis.  All questions were answered. The patient knows to call the clinic with any problems, questions or concerns.   Mikey Bussing, NP 06/01/2021 9:42 AM  Subjective: The patient was seen today with the assistance of a video  interpreter.  --Mr. Mcclish feels well overall today. --Appetite is good.  He is not having any fevers or chills. --He had a PICC line placed and plan is for 6 weeks of antibiotics for endocarditis. --He anticipates discharge to home later today. --No questions concerns or complaints today.  Objective:  Vitals:   06/01/21 0700 06/01/21 0854  BP:    Pulse:  65  Resp:    Temp: 98.9 F (37.2 C)   SpO2:       Intake/Output Summary (Last 24 hours) at 06/01/2021 0160 Last data filed at 06/01/2021 0600 Gross per 24 hour  Intake 320.02 ml  Output 2450 ml  Net -2129.98 ml    GENERAL: Well-appearing male, alert, no distress and comfortable. SKIN: skin color, texture, turgor are normal, no rashes or significant lesions EYES: normal, Conjunctiva are pink and non-injected, sclera clear LUNGS: clear to auscultation and percussion with normal breathing effort HEART: regular rate & rhythm and no murmurs with mild bilateral lower extremity edema Musculoskeletal:no cyanosis of digits and no clubbing  NEURO: alert & oriented x 3 with fluent speech, no focal motor/sensory deficits   Labs:  Recent Labs    05/13/21 1014 05/23/21 1540 05/24/21 0324 05/25/21 0338 05/27/21 0338 05/28/21 0320 05/30/21 0248  NA 140 135 140   < > 142 140 140  K 4.8 3.9 3.9   < > 3.4* 3.6 3.5  CL 107 103 107   < > 110 106 105  CO2 23 24 26    < > 27 25 29   GLUCOSE 370* 190* 196*   < > 116* 185* 254*  BUN 25* 21 23   < > 21 18 18  CREATININE 1.77* 1.73* 1.69*   < > 1.58* 1.42* 1.41*  CALCIUM 8.6* 8.7* 8.6*   < > 8.1* 8.2* 8.3*  GFRNONAA 40* 41* 42*   < > 46* 52* 53*  PROT 5.8* 6.1* 5.6*  --   --   --   --   ALBUMIN 2.9* 3.6 3.0*  --   --   --   --   AST 31 16 13*  --   --   --   --   ALT 58* 23 19  --   --   --   --   ALKPHOS 82 94 76  --   --   --   --   BILITOT 0.4 1.4* 1.3*  --   --   --   --    < > = values in this interval not displayed.    Studies: I personally reviewed his CT imaging US  RENAL  Result Date: 05/06/2021 CLINICAL DATA:  Acute on chronic kidney disease EXAM: RENAL / URINARY TRACT ULTRASOUND COMPLETE COMPARISON:  CT 04/30/2021 FINDINGS: Right Kidney: Renal measurements: 11.8 x 7.5 x 5.6 cm = volume: 259.6 mL. Echogenicity within normal limits. Mild right hydronephrosis which is likely improved as compared with recent CT. 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 Left Kidney: Renal measurements: 12.3 x 6 x 5.4 cm = volume: 7.7 mL. Small cyst at the midpole measuring 12 x 14 x 11 mm additional small cyst at the midpole measuring 12 x 9 x 15 mm. Bladder: Appears normal for degree of bladder distention. Other: None. IMPRESSION: 1. Mild right hydronephrosis probably improved as compared with prior CT. Right UPJ region mass on CT is not well demonstrated on sonogram today. 2. Small left renal cysts.  No left hydronephrosis Electronically Signed   By: Donavan Foil M.D.   On: 05/06/2021 21:28   DG Chest Port 1 View  Result Date: 05/23/2021 CLINICAL DATA:  Fever EXAM: PORTABLE CHEST 1 VIEW COMPARISON:  05/06/2021 FINDINGS: Low volume AP portable examination with mild, diffuse bilateral interstitial opacity and heterogeneous opacity in the retrocardiac left lung base. Cardiomegaly. The visualized skeletal structures are unremarkable. IMPRESSION: 1. Low volume AP portable examination with mild, diffuse bilateral interstitial opacity and heterogeneous opacity in the retrocardiac left lung base, which may reflect atelectasis in the setting of cardiomegaly or alternately infection/aspiration. 2. Cardiomegaly. Electronically Signed   By: Eddie Candle M.D.   On: 05/23/2021 15:49   DG Chest Port 1 View  Result Date: 05/06/2021 CLINICAL DATA:  75 year old male with concern for sepsis. EXAM: PORTABLE CHEST 1 VIEW COMPARISON:  None. FINDINGS: No focal consolidation, pleural effusion, or pneumothorax. Borderline cardiomegaly. Atherosclerotic calcification of the aorta. Bilateral  hilar prominence, likely dilatation of the main pulmonary arteries and suggestive of pulmonary hypertension. Adenopathy is less likely but not excluded. Attention on follow-up imaging recommended. IMPRESSION: 1. No acute cardiopulmonary process. 2. Borderline cardiomegaly with findings suggestive of pulmonary hypertension. Electronically Signed   By: Anner Crete M.D.   On: 05/06/2021 18:27   ECHOCARDIOGRAM COMPLETE  Result Date: 05/24/2021    ECHOCARDIOGRAM REPORT   Patient Name:   [name] Date of Exam: 05/24/2021 Medical Rec #:  [id number]        Height:       70.0 in Accession #:    [id number]       Weight:       252.3 lb Date of Birth:  [birth year]  BSA:          2.304 m Patient Age:    31 years         BP:           149/73 mmHg Patient Gender: M                HR:           62 bpm. Exam Location:  Inpatient Procedure: 2D Echo, Cardiac Doppler and Color Doppler Indications:    Cardiomegaly  History:        Patient has no prior history of Echocardiogram examinations,                 most recent 05/13/2021. Angina and CAD, Arrythmias:Atrial                 Fibrillation; Risk Factors:Diabetes, Dyslipidemia and                 Hypertension.  Sonographer:    Luisa Hart RDCS Referring Phys: [place]  Sonographer Comments: Patient is morbidly obese. Image acquisition challenging due to patient body habitus and Image acquisition challenging due to respiratory motion. IMPRESSIONS  1. No obvious regional wall motion abnormaliies are seen, but the apical views are difficult to evaluate. Left ventricular ejection fraction, by estimation, is 45 to 50%. The left ventricle has mildly decreased function. Left ventricular endocardial border not optimally defined to evaluate regional wall motion. The left ventricular internal cavity size was mildly dilated. Left ventricular diastolic parameters are consistent with Grade II diastolic dysfunction (pseudonormalization). Elevated left atrial  pressure.  2. Right ventricular systolic function is normal. The right ventricular size is mildly enlarged. There is mildly elevated pulmonary artery systolic pressure. The estimated right ventricular systolic pressure is 29.4 mmHg.  3. Left atrial size was moderately dilated.  4. Right atrial size was mildly dilated.  5. The mitral valve is normal in structure. No evidence of mitral valve regurgitation.  6. The aortic valve is tricuspid. There is mild calcification of the aortic valve. There is mild thickening of the aortic valve. Aortic valve regurgitation is not visualized. Mild to moderate aortic valve sclerosis/calcification is present, without any evidence of aortic stenosis.  7. Aortic dilatation noted. There is mild dilatation of the aortic root, measuring 40 mm. FINDINGS  Left Ventricle: No obvious regional wall motion abnormaliies are seen, but the apical views are difficult to evaluate. Left ventricular ejection fraction, by estimation, is 45 to 50%. The left ventricle has mildly decreased function. Left ventricular endocardial border not optimally defined to evaluate regional wall motion. The left ventricular internal cavity size was mildly dilated. There is no left ventricular hypertrophy. Left ventricular diastolic parameters are consistent with Grade II diastolic dysfunction (pseudonormalization). Elevated left atrial pressure. Right Ventricle: The right ventricular size is mildly enlarged. No increase in right ventricular wall thickness. Right ventricular systolic function is normal. There is mildly elevated pulmonary artery systolic pressure. The tricuspid regurgitant velocity is 2.89 m/s, and with an assumed right atrial pressure of 3 mmHg, the estimated right ventricular systolic pressure is 76.5 mmHg. Left Atrium: Left atrial size was moderately dilated. Right Atrium: Right atrial size was mildly dilated. Pericardium: There is no evidence of pericardial effusion. Mitral Valve: The mitral valve is  normal in structure. Mild mitral annular calcification. No evidence of mitral valve regurgitation. Tricuspid Valve: The tricuspid valve is not well visualized. Tricuspid valve regurgitation is trivial. Aortic Valve: The aortic valve is tricuspid. There  is mild calcification of the aortic valve. There is mild thickening of the aortic valve. Aortic valve regurgitation is not visualized. Mild to moderate aortic valve sclerosis/calcification is present, without any evidence of aortic stenosis. Aortic valve mean gradient measures 9.0 mmHg. Aortic valve peak gradient measures 16.2 mmHg. Aortic valve area, by VTI measures 2.84 cm. Pulmonic Valve: The pulmonic valve was grossly normal. Pulmonic valve regurgitation is not visualized. Aorta: Aortic dilatation noted. There is mild dilatation of the aortic root, measuring 40 mm. IAS/Shunts: No atrial level shunt detected by color flow Doppler.  LEFT VENTRICLE PLAX 2D LVIDd:         5.87 cm      Diastology LVIDs:         4.59 cm      LV e' medial:    5.87 cm/s LV PW:         0.80 cm      LV E/e' medial:  18.1 LV IVS:        0.80 cm      LV e' lateral:   7.40 cm/s LVOT diam:     2.30 cm      LV E/e' lateral: 14.3 LV SV:         108 LV SV Index:   47 LVOT Area:     4.15 cm  LV Volumes (MOD) LV vol d, MOD A2C: 62.7 ml LV vol d, MOD A4C: 116.0 ml LV vol s, MOD A2C: 28.6 ml LV vol s, MOD A4C: 71.8 ml LV SV MOD A2C:     34.1 ml LV SV MOD A4C:     116.0 ml LV SV MOD BP:      44.3 ml RIGHT VENTRICLE RV Basal diam:  4.35 cm RV Mid diam:    2.80 cm RV S prime:     17.40 cm/s TAPSE (M-mode): 2.7 cm LEFT ATRIUM             Index       RIGHT ATRIUM           Index LA diam:        4.50 cm 1.95 cm/m  RA Area:     20.70 cm LA Vol (A2C):   37.4 ml 16.23 ml/m RA Volume:   62.00 ml  26.91 ml/m LA Vol (A4C):   57.0 ml 24.74 ml/m LA Biplane Vol: 46.8 ml 20.31 ml/m  AORTIC VALVE                    PULMONIC VALVE AV Area (Vmax):    3.06 cm     PV Vmax:       1.08 m/s AV Area (Vmean):   3.09  cm     PV Vmean:      78.400 cm/s AV Area (VTI):     2.84 cm     PV VTI:        0.234 m AV Vmax:           201.00 cm/s  PV Peak grad:  4.7 mmHg AV Vmean:          137.000 cm/s PV Mean grad:  3.0 mmHg AV VTI:            0.379 m AV Peak Grad:      16.2 mmHg AV Mean Grad:      9.0 mmHg LVOT Vmax:         148.00 cm/s LVOT Vmean:        102.000 cm/s  LVOT VTI:          0.259 m LVOT/AV VTI ratio: 0.68  AORTA Ao Root diam: 4.00 cm Ao Asc diam:  3.50 cm MITRAL VALVE                TRICUSPID VALVE MV Area (PHT): 3.74 cm     TR Peak grad:   33.4 mmHg MV Decel Time: 203 msec     TR Vmax:        289.00 cm/s MV E velocity: 106.00 cm/s MV A velocity: 45.40 cm/s   SHUNTS MV E/A ratio:  2.33         Systemic VTI:  0.26 m                             Systemic Diam: 2.30 cm Sanda Klein MD Electronically signed by Sanda Klein MD Signature Date/Time: 05/24/2021/2:39:41 PM    Final    CT Renal Stone Study  Result Date: 05/23/2021 CLINICAL DATA:  Hydronephrosis, weakness, recently completed antibiotics. Persistent fever EXAM: CT ABDOMEN AND PELVIS WITHOUT CONTRAST TECHNIQUE: Multidetector CT imaging of the abdomen and pelvis was performed following the standard protocol without IV contrast. COMPARISON:  CT 04/30/2021, CT 05/04/2021, ultrasound 05/06/2021 FINDINGS: Lower chest: Small bilateral effusions, new from prior. Some additional more consolidative opacity the left lung base, could be atelectatic or airspace disease. Diffuse interlobular septal thickening and fissural thickening. Cardiac size within normal limits. Coronary artery atherosclerosis. No pericardial effusion. Hepatobiliary: No worrisome focal liver lesions. Smooth liver surface contour. Normal hepatic attenuation. Normal gallbladder and biliary tree. Pancreas: Partial fatty replacement of the pancreas. No pancreatic ductal dilatation or surrounding inflammatory changes. Spleen: Normal in size. No concerning splenic lesions. Adrenals/Urinary Tract: Normal adrenal  glands. Bilateral perinephric stranding, similar to slightly increased from comparison prior. A right double-J nephroureteral stent is in place at the time of exam. Proximal and appropriately terminating in the renal pelvis. Distal and terminating within the urinary bladder. No residual right hydronephrosis. Redemonstration of a heterogeneous mass at the right ureteropelvic junction which appears external to the ureter and kidney, measuring approximately 5.4 x 3.5 cm in size, previously 4.5 x 3 cm in size. Suspect several left parapelvic cysts. No concerning left pelvic lesion. Few small left renal cyst corresponding well to those seen on comparison ultrasound and CT imaging. Urinary bladder is largely decompressed by inflated Foley catheter. Small amount of joint luminal gas is nonspecific given instrumentation. Some degree of bladder wall thickening is expected given underdistention though faint perivesicular hazy stranding is present. Portions of the bladder obscured by streak artifact from a right hip arthroplasty. Stomach/Bowel: Distal esophagus, stomach and duodenal sweep are unremarkable. No small bowel wall thickening or dilatation. No evidence of obstruction. A normal appendix is visualized. No colonic dilatation or wall thickening. Scattered colonic diverticula without focal inflammation to suggest diverticulitis. Vascular/Lymphatic: Extensive mesenteric infiltration. Pathologic mesenteric adenopathy throughout the mid mesentery with a larger dominant mass measuring 5.6 by 3.6 cm in size which appears to decreased in the interim having previously measured 6.7 x 4.1 cm at similar level. There is persistent pathologic adenopathy throughout the abdomen and pelvis most pronounced in the periaortic chains and central mesentery. Index right common iliac node measures 1.8 cm in short axis (2/63), not significantly changed from prior. Additional index right pelvic sidewall node measuring 1 cm short axis is  unchanged from prior as well (2/75). Atherosclerotic plaque throughout the abdominal aorta and  branch vessels. No aneurysm or ectasia. Reproductive: Mild prostatomegaly. Seminal vesicles are unremarkable. Other: Mesenteric infiltration. Perinephric retroperitoneal stranding and trace free fluid in the pararenal spaces. No bowel containing hernia. No free air. Musculoskeletal: The osseous structures appear diffusely demineralized which may limit detection of small or nondisplaced fractures. No acute osseous abnormality or suspicious osseous lesion. Prior right hip arthroplasty in expected alignment. No acute or suspicious osseous lesions. IMPRESSION: 1. 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 A lobulated retroperitoneal soft tissue mass towards the right ureteropelvic junction with resulting compressive mass effect upon the collecting system has slightly increased in size from prior now measuring 5.4 x 3.5 cm. A double-J nephroureteral stent remains in place without significant residual hydronephrosis at this time. 2. A separate large deposit in the upper mesentery as seemingly decreased in the interim while other index lymph nodes remain stable from prior. 3. Extensive perinephric, periureteral and perivesicular stranding, concerning for an ascending tract infection in the setting of sepsis. 4. Patchy area of opacity in posterior left lung base, possibly atelectatic or airspace disease with additional features of developing pulmonary edema and bilateral pleural effusions. 5. Prostatomegaly. 6. Prior right hip arthroplasty. 7. Aortic Atherosclerosis (ICD10-I70.0). 8. Coronary atherosclerosis. These results were called by telephone at the time of interpretation on 05/23/2021 at 9:46 pm to provider Doutova MD, who verbally acknowledged these results. Electronically Signed   By: Lovena Le M.D.   On: 05/23/2021 21:46   ECHO  TEE  Result Date: 05/29/2021    TRANSESOPHOGEAL ECHO REPORT   Patient Name:   [name] Date of Exam: 05/29/2021 Medical Rec #:  [id number]        Height:       70.0 in Accession #:    [id number]       Weight:       257.5 lb Date of Birth:  [birth year]         BSA:          2.324 m Patient Age:    89 years         BP:           159/74 mmHg Patient Gender: M                HR:           56 bpm. Exam Location:  Outpatient Procedure: 2D Echo Indications:     Bacteremia  History:         Patient has prior history of Echocardiogram examinations, most                  recent 05/24/2021. CAD; Risk Factors:Diabetes, Sleep Apnea and                  Dyslipidemia.  Sonographer:     Johny Chess Referring Phys:  Nancy Fetter DUNN Diagnosing Phys: Rudean Haskell MD PROCEDURE: After discussion of the risks and benefits of a TEE, an informed consent was obtained from the patient. The transesophogeal probe was passed without difficulty through the esophogus of the patient. Imaged were obtained with the patient in a left lateral decubitus position. Sedation performed by different physician. The patient was monitored while under deep sedation. Anesthestetic sedation was provided intravenously by Anesthesiology: 388mg  of Propofol. The patient developed no complications during the procedure. IMPRESSIONS  1. The mitral valve is degenerative. There is a <1 cm echodensity on the atrial surface on the mitral valve  without associated regurgitation. Echodensity is most consistent with valvular calcification, though comparison imaging not available. Trivial mitral valve regurgitation. No evidence of mitral stenosis.  2. Aortic dilatation noted. There is moderate dilatation of the ascending aorta, measuring 47 mm. There is Moderate (Grade III) plaque involving the transverse aorta.  3. Left ventricular ejection fraction, by estimation, is 45 to 50%. The left ventricle has mildly decreased function.  4. Right ventricular  systolic function is normal. The right ventricular size is normal.  5. No left atrial/left atrial appendage thrombus was detected. The LAA emptying velocity was 41 cm/s.  6. The aortic valve is tricuspid. There is mild calcification of the aortic valve. There is mild thickening of the aortic valve. Aortic valve regurgitation is not visualized. Mild aortic valve sclerosis is present, with no evidence of aortic valve stenosis. Conclusion(s)/Recommendation(s): Consider follow-up imaging for ascending aortic size in 6 months (CTA or MRA). FINDINGS  Left Ventricle: Left ventricular ejection fraction, by estimation, is 45 to 50%. The left ventricle has mildly decreased function. The left ventricular internal cavity size was normal in size. Right Ventricle: The right ventricular size is normal. Right vetricular wall thickness was not assessed. Right ventricular systolic function is normal. Left Atrium: Left atrial size was normal in size. No left atrial/left atrial appendage thrombus was detected. The LAA emptying velocity was 41 cm/s. Right Atrium: Right atrial size was normal in size. Pericardium: There is no evidence of pericardial effusion. Mitral Valve: The mitral valve is degenerative in appearance. Trivial mitral valve regurgitation. No evidence of mitral valve stenosis. Tricuspid Valve: The tricuspid valve is normal in structure. Tricuspid valve regurgitation is trivial. Aortic Valve: The aortic valve is tricuspid. There is mild calcification of the aortic valve. There is mild thickening of the aortic valve. There is mild aortic valve annular calcification. Aortic valve regurgitation is not visualized. Mild aortic valve sclerosis is present, with no evidence of aortic valve stenosis. Pulmonic Valve: The pulmonic valve was grossly normal. Pulmonic valve regurgitation is not visualized. Aorta: The aortic root is normal in size and structure and aortic dilatation noted. There is moderate dilatation of the ascending  aorta, measuring 47 mm. There is moderate (Grade III) plaque involving the transverse aorta. IAS/Shunts: The atrial septum is grossly normal.  LEFT VENTRICLE PLAX 2D LVOT diam:     2.60 cm LVOT Area:     5.31 cm   AORTA Ao Root diam: 3.70 cm Ao Asc diam:  4.70 cm  SHUNTS Systemic Diam: 2.60 cm Rudean Haskell MD Electronically signed by Rudean Haskell MD Signature Date/Time: 05/29/2021/5:10:33 PM    Final    [place] EKG SITE RITE  Result Date: 05/28/2021 If Site Rite image not attached, placement could not be confirmed due to current cardiac rhythm.  CT RENAL BIOPSY  Result Date: 05/04/2021 INDICATION: 75 year old male with right infrarenal retroperitoneal mass and abdominal lymphadenopathy of indeterminate etiology. EXAM: CT BIOPSY CORE RENAL COMPARISON:  05/30/2021 MEDICATIONS: None. ANESTHESIA/SEDATION: Fentanyl 100 mcg IV; Versed 2 mg IV Sedation time: 13 minutes; The patient was continuously monitored during the procedure by the interventional radiology nurse under my direct supervision. CONTRAST:  None. COMPLICATIONS: None immediate. PROCEDURE: Informed consent was obtained from the patient following an explanation of the procedure, risks, benefits and alternatives. A time out was performed prior to the initiation of the procedure. The patient was positioned prone on the CT table and a limited CT was performed for procedural planning demonstrating similar appearing soft tissue mass about the inferior aspect  of the right kidney which appears to surround the proximal right ureter. The procedure was planned. The operative site was prepped and draped in the usual sterile fashion. Appropriate trajectory was confirmed with a 22 gauge spinal needle after the adjacent tissues were anesthetized with 1% Lidocaine with epinephrine. Under intermittent CT guidance, a 17 gauge coaxial needle was advanced into the peripheral aspect of the mass. Appropriate positioning was confirmed and total of 3 samples were  obtained with an 18 gauge core needle biopsy device. The co-axial needle was removed while injecting Gel-Foam slurry and hemostasis was achieved with manual compression. A limited postprocedural CT was negative for hemorrhage or additional complication. A dressing was placed. The patient tolerated the procedure well without immediate postprocedural complication. IMPRESSION: Technically successful CT guided core needle biopsy of right infrarenal retroperitoneal mass. Ruthann Cancer, MD [facility] Electronically Signed   By: Ruthann Cancer MD   On: 05/04/2021 14:23

## 2021-06-01 NOTE — Progress Notes (Signed)
  Echocardiogram 2D Echocardiogram has been performed.  [name] 06/01/2021, 12:34 PM

## 2021-06-03 ENCOUNTER — Ambulatory Visit (HOSPITAL_COMMUNITY): Admission: RE | Admit: 2021-06-03 | Payer: Medicare HMO | Source: Ambulatory Visit

## 2021-06-03 ENCOUNTER — Other Ambulatory Visit: Payer: Self-pay

## 2021-06-03 ENCOUNTER — Ambulatory Visit (HOSPITAL_COMMUNITY)
Admission: RE | Admit: 2021-06-03 | Discharge: 2021-06-03 | Disposition: A | Discharge status: Home / Self Care | Payer: Medicare HMO | Source: Ambulatory Visit | Attending: Hematology and Oncology | Admitting: Hematology and Oncology

## 2021-06-03 DIAGNOSIS — C8333 Diffuse large B-cell lymphoma, intra-abdominal lymph nodes: Secondary | ICD-10-CM | POA: Insufficient documentation

## 2021-06-03 DIAGNOSIS — S2241XA Multiple fractures of ribs, right side, initial encounter for closed fracture: Secondary | ICD-10-CM | POA: Diagnosis not present

## 2021-06-03 DIAGNOSIS — R161 Splenomegaly, not elsewhere classified: Secondary | ICD-10-CM | POA: Diagnosis not present

## 2021-06-03 DIAGNOSIS — X58XXXA Exposure to other specified factors, initial encounter: Secondary | ICD-10-CM | POA: Insufficient documentation

## 2021-06-03 LAB — GLUCOSE, CAPILLARY: Glucose-Capillary: 185 mg/dL — ABNORMAL HIGH (ref 70–99)

## 2021-06-03 MED ORDER — FLUDEOXYGLUCOSE F - 18 (FDG) INJECTION
12.8000 | Freq: Once | INTRAVENOUS | Status: AC
  Administered 2021-06-03: 13.4 via INTRAVENOUS

## 2021-06-03 NOTE — Progress Notes (Signed)
Contacted by [facility] tech for PICC evaluation.   [facility] tech states that the PICC appears to be malpositioned, and it looks like there is some liquid coming out around the PICC site.   Patient states that the PICC was placed at the bedside while he was hospitalized, he has not had any issue with it.  Home health nurse changed the dressing yesterday and the skin under the Tegarderm has been mildly itching.   Upon evaluation, the PICC appears to be intact.  PICC flushes and aspirates well.  Changed the dressing per patient request.   Patient states that he does not think that the home health nurse will come for routine catheter care anymore. Informed the patient that this PA is not fully aware of the routine catheter care process after patient is discharged, suggested to contact his primary care or infectious disease providers to obtain more information.  Patient verbalized understanding.    Armando Gang Dwyne Hasegawa PA-C 06/03/2021 5:07 PM

## 2021-06-04 ENCOUNTER — Other Ambulatory Visit: Payer: Self-pay | Admitting: Hematology and Oncology

## 2021-06-04 NOTE — Progress Notes (Signed)
START ON PATHWAY REGIMEN - Lymphoma and CLL     A cycle is every 21 days:     Prednisone      Rituximab-xxxx      Cyclophosphamide      Etoposide      Vincristine      Etoposide   **Always confirm dose/schedule in your pharmacy ordering system**  Patient Characteristics: Diffuse Large B-Cell Lymphoma or Follicular Lymphoma, Grade 3B, First Line, Stage III and IV Disease Type: Not Applicable Disease Type: Diffuse Large B-Cell Lymphoma Disease Type: Not Applicable Line of therapy: First Line Intent of Therapy: Curative Intent, Discussed with Patient

## 2021-06-05 ENCOUNTER — Inpatient Hospital Stay: Payer: Medicare HMO

## 2021-06-05 ENCOUNTER — Inpatient Hospital Stay: Payer: Medicare HMO | Attending: Hematology and Oncology | Admitting: Hematology and Oncology

## 2021-06-05 ENCOUNTER — Ambulatory Visit: Payer: Medicare HMO

## 2021-06-05 ENCOUNTER — Other Ambulatory Visit: Payer: Self-pay

## 2021-06-05 ENCOUNTER — Other Ambulatory Visit: Payer: Self-pay | Admitting: Hematology and Oncology

## 2021-06-05 VITALS — BP 131/55 | HR 65 | Temp 97.3°F | Resp 20 | Ht 70.0 in | Wt 248.6 lb

## 2021-06-05 DIAGNOSIS — C8333 Diffuse large B-cell lymphoma, intra-abdominal lymph nodes: Secondary | ICD-10-CM | POA: Diagnosis present

## 2021-06-05 DIAGNOSIS — I4891 Unspecified atrial fibrillation: Secondary | ICD-10-CM | POA: Insufficient documentation

## 2021-06-05 DIAGNOSIS — R591 Generalized enlarged lymph nodes: Secondary | ICD-10-CM

## 2021-06-05 DIAGNOSIS — G4733 Obstructive sleep apnea (adult) (pediatric): Secondary | ICD-10-CM | POA: Diagnosis not present

## 2021-06-05 DIAGNOSIS — E1122 Type 2 diabetes mellitus with diabetic chronic kidney disease: Secondary | ICD-10-CM | POA: Diagnosis not present

## 2021-06-05 DIAGNOSIS — Z5112 Encounter for antineoplastic immunotherapy: Secondary | ICD-10-CM | POA: Diagnosis present

## 2021-06-05 DIAGNOSIS — I129 Hypertensive chronic kidney disease with stage 1 through stage 4 chronic kidney disease, or unspecified chronic kidney disease: Secondary | ICD-10-CM | POA: Diagnosis not present

## 2021-06-05 DIAGNOSIS — Z452 Encounter for adjustment and management of vascular access device: Secondary | ICD-10-CM

## 2021-06-05 LAB — CBC WITH DIFFERENTIAL (CANCER CENTER ONLY)
Abs Immature Granulocytes: 0.07 10*3/uL (ref 0.00–0.07)
Basophils Absolute: 0.1 10*3/uL (ref 0.0–0.1)
Basophils Relative: 2 %
Eosinophils Absolute: 0.4 10*3/uL (ref 0.0–0.5)
Eosinophils Relative: 9 %
HCT: 30.2 % — ABNORMAL LOW (ref 39.0–52.0)
Hemoglobin: 9.7 g/dL — ABNORMAL LOW (ref 13.0–17.0)
Immature Granulocytes: 2 %
Lymphocytes Relative: 39 %
Lymphs Abs: 1.6 10*3/uL (ref 0.7–4.0)
MCH: 25.7 pg — ABNORMAL LOW (ref 26.0–34.0)
MCHC: 32.1 g/dL (ref 30.0–36.0)
MCV: 79.9 fL — ABNORMAL LOW (ref 80.0–100.0)
Monocytes Absolute: 0.7 10*3/uL (ref 0.1–1.0)
Monocytes Relative: 18 %
Neutro Abs: 1.2 10*3/uL — ABNORMAL LOW (ref 1.7–7.7)
Neutrophils Relative %: 30 %
Platelet Count: 171 10*3/uL (ref 150–400)
RBC: 3.78 MIL/uL — ABNORMAL LOW (ref 4.22–5.81)
RDW: 16.8 % — ABNORMAL HIGH (ref 11.5–15.5)
WBC Count: 4 10*3/uL (ref 4.0–10.5)
nRBC: 0 % (ref 0.0–0.2)

## 2021-06-05 LAB — CMP (CANCER CENTER ONLY)
ALT: 24 U/L (ref 0–44)
AST: 16 U/L (ref 15–41)
Albumin: 3.4 g/dL — ABNORMAL LOW (ref 3.5–5.0)
Alkaline Phosphatase: 86 U/L (ref 38–126)
Anion gap: 10 (ref 5–15)
BUN: 23 mg/dL (ref 8–23)
CO2: 23 mmol/L (ref 22–32)
Calcium: 9.1 mg/dL (ref 8.9–10.3)
Chloride: 107 mmol/L (ref 98–111)
Creatinine: 1.51 mg/dL — ABNORMAL HIGH (ref 0.61–1.24)
GFR, Estimated: 48 mL/min — ABNORMAL LOW (ref >60)
Glucose, Bld: 224 mg/dL — ABNORMAL HIGH (ref 70–99)
Potassium: 4.4 mmol/L (ref 3.5–5.1)
Sodium: 140 mmol/L (ref 135–145)
Total Bilirubin: 0.6 mg/dL (ref 0.3–1.2)
Total Protein: 6 g/dL — ABNORMAL LOW (ref 6.5–8.1)

## 2021-06-05 LAB — LACTATE DEHYDROGENASE: LDH: 149 U/L (ref 98–192)

## 2021-06-05 LAB — URIC ACID: Uric Acid, Serum: 3.5 mg/dL — ABNORMAL LOW (ref 3.7–8.6)

## 2021-06-05 MED ORDER — SODIUM CHLORIDE 0.9% FLUSH
10.0000 mL | INTRAVENOUS | Status: DC | PRN
  Filled 2021-06-05: qty 10

## 2021-06-05 MED ORDER — HEPARIN SOD (PORK) LOCK FLUSH 100 UNIT/ML IV SOLN
250.0000 [IU] | INTRAVENOUS | Status: DC | PRN
  Filled 2021-06-05: qty 5

## 2021-06-05 NOTE — Progress Notes (Signed)
[place] [phone]   [phone]  PROGRESS NOTE  Patient Care Team: [name], MD as PCP - General (Family Medicine)  Hematological/Oncological History # Diffuse Large B Cell Lymphoma, Stage III/IV 04/30/2021: presented with RLQ abdominal pain. CT abdomen/pelvis shows pathologic mesenteric, retroperitoneal, and pelvic adenopathy, as described above. Moderate splenomegaly. Lobulated retroperitoneal soft tissue masses in the region of the right ureteropelvic junction likely resulting in mild right hydronephrosis secondary to extrinsic mass effect as well as within the right retroperitoneum 05/04/2021: needle core biopsy of right retroperitoneal lymph node shows large B cell lymphoma. 05/13/2021: establish care with Dr. Lorenso Courier 05/23/2021-06/01/2021: admitted with urosepsis. Concern for endocarditis, started on 6 week course abx via PICC line.    Interval History:  [name] 75 y.o. male with medical history significant for stage III/IV diffuse large B-cell lymphoma presents for a follow up visit. The patient's last visit was on 05/13/2021 at which time he established care.. In the interim since the last visit he was admitted to the hospital from7/07/2021-06/01/2021 for urosepsis and concern for endocarditis.  ID was consulted and currently has him on a 6-week course of antibiotics via his PICC line.  He also had a PET CT scan which confirmed the diagnosis of a stage III/IV DLBCL.  On exam today our discussions are assisted by the help of a hospital approved interpreter.  Mr. Crumpacker notes that he has been well since his discharge from the hospital.  He is not been having issues with fevers, chills, sweats, nausea, vomiting or diarrhea.  He notes that he is having some trouble with the bandaging around his PICC line but there is been no leakage and no other difficulty with the administration of his IV antibiotics.  He notes that his appetite is good his  weight is stable and he has no other questions comments or concerns.  He denies any bumps or lumps on his neck.  The bulk of our discussion today focused on the diagnosis of diffuse large B-cell lymphoma, the stage, and the treatment moving forward.  MEDICAL HISTORY:  Past Medical History:  Diagnosis Date   Anginal pain ([place])    Coronary artery disease    Diabetes mellitus without complication ([place])    Dysrhythmia    Hypertension    Sleep apnea     SURGICAL HISTORY: Past Surgical History:  Procedure Laterality Date   CARDIAC CATHETERIZATION     CYSTOSCOPY WITH RETROGRADE PYELOGRAM, URETEROSCOPY AND STENT PLACEMENT Right 05/01/2021   Procedure: CYSTOSCOPY WITH RIGHT  RETROGRADE PYELOGRAM,  AND RIGHT STENT PLACEMENT;  Surgeon: Festus Aloe, MD;  Location: [facility];  Service: Urology;  Laterality: Right;   EYE SURGERY     INJECTION OF SILICONE OIL Left 28/76/8115   Procedure: INJECTION OF SILICONE OIL;  Surgeon: Sherlynn Stalls, MD;  Location: [facility];  Service: Ophthalmology;  Laterality: Left;   INJECTION OF SILICONE OIL Left 06/09/2034   Procedure: INJECTION OF SILICONE OIL;  Surgeon: Sherlynn Stalls, MD;  Location: [facility];  Service: Ophthalmology;  Laterality: Left;   JOINT REPLACEMENT Right    hip   LASER PHOTO ABLATION Left 12/25/2020   Procedure: LASER PHOTO ABLATION;  Surgeon: Sherlynn Stalls, MD;  Location: [facility];  Service: Ophthalmology;  Laterality: Left;   PARS PLANA VITRECTOMY Left 11/10/2020   Procedure: PARS PLANA VITRECTOMY WITH 25 GAUGE;  Surgeon: Sherlynn Stalls, MD;  Location: [facility];  Service: Ophthalmology;  Laterality: Left;   PARS PLANA VITRECTOMY Left 12/25/2020  Procedure: PARS PLANA VITRECTOMY WITH 25 GAUGE IN LEFT EYE;  Surgeon: Sherlynn Stalls, MD;  Location: [place];  Service: Ophthalmology;  Laterality: Left;   PERFLUORONE INJECTION Left 12/25/2020   Procedure: PERFLUORONE INJECTION;  Surgeon: Sherlynn Stalls, MD;  Location: [place];  Service: Ophthalmology;   Laterality: Left;   PHOTOCOAGULATION WITH LASER Left 11/10/2020   Procedure: PHOTOCOAGULATION WITH LASER;  Surgeon: Sherlynn Stalls, MD;  Location: [facility];  Service: Ophthalmology;  Laterality: Left;   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   Procedure: RETINECTOMY LEFT EYE;  Surgeon: Sherlynn Stalls, MD;  Location: [facility];  Service: Ophthalmology;  Laterality: Left;   SILICON OIL REMOVAL Left 9/73/5329   Procedure: SILICONE OIL REMOVAL;  Surgeon: Sherlynn Stalls, MD;  Location: [place];  Service: Ophthalmology;  Laterality: Left;   TEE WITHOUT CARDIOVERSION N/A 05/29/2021   Procedure: TRANSESOPHAGEAL ECHOCARDIOGRAM (TEE);  Surgeon: Werner Lean, MD;  Location: [facility];  Service: Cardiovascular;  Laterality: N/A;    SOCIAL HISTORY: Social History   Socioeconomic History   Marital status: Married    Spouse name: Not on file   Number of children: Not on file   Years of education: Not on file   Highest education level: Not on file  Occupational History   Not on file  Tobacco Use   Smoking status: Never   Smokeless tobacco: Never  Vaping Use   Vaping Use: Never used  Substance and Sexual Activity   Alcohol use: Not Currently   Drug use: Never   Sexual activity: Yes  Other Topics Concern   Not on file  Social History Narrative   Not on file   Social Determinants of Health   Financial Resource Strain: Not on file  Food Insecurity: Not on file  Transportation Needs: Not on file  Physical Activity: Not on file  Stress: Not on file  Social Connections: Not on file  Intimate Partner Violence: Not on file    FAMILY HISTORY: Family History  Problem Relation Age of Onset   CAD Other     ALLERGIES:  has No Known Allergies.  MEDICATIONS:  Current Outpatient Medications  Medication Sig Dispense Refill   acetaminophen (TYLENOL) 650 MG CR tablet Take 650 mg by mouth every 8 (eight) hours as needed for pain or fever (headache).     amiodarone  (PACERONE) 200 MG tablet Take 200 mg by mouth every morning.     amLODipine (NORVASC) 10 MG tablet Take 1 tablet (10 mg total) by mouth daily. 30 tablet 1   apixaban (ELIQUIS) 5 MG TABS tablet Take 5 mg by mouth 2 (two) times daily.     atorvastatin (LIPITOR) 20 MG tablet Take 20 mg by mouth at bedtime.     clopidogrel (PLAVIX) 75 MG tablet Take 75 mg by mouth every morning.     finasteride (PROSCAR) 5 MG tablet Take 5 mg by mouth at bedtime.     glipiZIDE (GLUCOTROL) 10 MG tablet Take 10 mg by mouth 2 (two) times daily.     insulin glargine (LANTUS) 100 unit/mL SOPN Inject 34 Units into the skin at bedtime.     isosorbide mononitrate (IMDUR) 30 MG 24 hr tablet Take 30 mg by mouth every morning.     metFORMIN (GLUCOPHAGE) 1000 MG tablet Take 1,000 mg by mouth 2 (two) times daily.     metoprolol tartrate (LOPRESSOR) 50 MG tablet Take 1 tablet (50 mg total) by mouth 2 (two) times daily. 60 tablet 1  nitroGLYCERIN (NITROSTAT) 0.3 MG SL tablet Place 0.3 mg under the tongue every 5 (five) minutes x 3 doses as needed for chest pain.     piperacillin-tazobactam (ZOSYN) IVPB Inject 13.5 g into the vein daily. As a continuous infusion Indication:  pseudomonas bacteremia with possible endocarditis First Dose: Yes Last Day of Therapy:  07/08/21 Labs - Once weekly:  CBC/D and BMP, Labs - Every other week:  ESR and CRP Method of administration: Elastomeric (Continuous infusion) Method of administration may be changed at the discretion of home infusion pharmacist based upon assessment of the patient and/or caregiver's ability to self-administer the medication ordered. 40 Units 0   silodosin (RAPAFLO) 8 MG CAPS capsule Take 8 mg by mouth at bedtime.     No current facility-administered medications for this visit.   Facility-Administered Medications Ordered in Other Visits  Medication Dose Route Frequency Provider Last Rate Last Admin   heparin lock flush 100 unit/mL  250 Units Intracatheter PRN Ledell Peoples IV, MD       sodium chloride flush (NS) 0.9 % injection 10 mL  10 mL Intracatheter PRN Orson Slick, MD        REVIEW OF SYSTEMS:   Constitutional: ( - ) fevers, ( - )  chills , ( - ) night sweats Eyes: ( - ) blurriness of vision, ( - ) double vision, ( - ) watery eyes Ears, nose, mouth, throat, and face: ( - ) mucositis, ( - ) sore throat Respiratory: ( - ) cough, ( - ) dyspnea, ( - ) wheezes Cardiovascular: ( - ) palpitation, ( - ) chest discomfort, ( - ) lower extremity swelling Gastrointestinal:  ( - ) nausea, ( - ) heartburn, ( - ) change in bowel habits Skin: ( - ) abnormal skin rashes Lymphatics: ( - ) new lymphadenopathy, ( - ) easy bruising Neurological: ( - ) numbness, ( - ) tingling, ( - ) new weaknesses Behavioral/Psych: ( - ) mood change, ( - ) new changes  All other systems were reviewed with the patient and are negative.  PHYSICAL EXAMINATION: ECOG PERFORMANCE STATUS: 1 - Symptomatic but completely ambulatory  Vitals:   06/05/21 0819  BP: (!) 131/55  Pulse: 65  Resp: 20  Temp: (!) 97.3 F (36.3 C)  SpO2: 99%   Filed Weights   06/05/21 0819  Weight: 248 lb 9.6 oz (112.8 kg)    GENERAL: Well-appearing elderly [place] male, alert, no distress and comfortable SKIN: skin color, texture, turgor are normal, no rashes or significant lesions EYES: conjunctiva are pink and non-injected, sclera clear NECK: supple, non-tender LYMPH:  no palpable lymphadenopathy in the cervical, axillary or inguinal LUNGS: clear to auscultation and percussion with normal breathing effort HEART: regular rate & rhythm and no murmurs and no lower extremity edema PSYCH: alert & oriented x 3, fluent speech NEURO: no focal motor/sensory deficits  LABORATORY DATA:  I have reviewed the data as listed CBC Latest Ref Rng & Units 06/05/2021 06/01/2021 05/30/2021  WBC 4.0 - 10.5 K/uL 4.0 3.4(L) 3.1(L)  Hemoglobin 13.0 - 17.0 g/dL 9.7(L) 9.2(L) 8.8(L)  Hematocrit 39.0 - 52.0 % 30.2(L)  29.2(L) 28.5(L)  Platelets 150 - 400 K/uL 171 188 202    CMP Latest Ref Rng & Units 06/05/2021 05/30/2021 05/28/2021  Glucose 70 - 99 mg/dL 224(H) 254(H) 185(H)  BUN 8 - 23 mg/dL '23 18 18  ' Creatinine 0.61 - 1.24 mg/dL 1.51(H) 1.41(H) 1.42(H)  Sodium 135 - 145 mmol/L 140 140  140  Potassium 3.5 - 5.1 mmol/L 4.4 3.5 3.6  Chloride 98 - 111 mmol/L 107 105 106  CO2 22 - 32 mmol/L '23 29 25  ' Calcium 8.9 - 10.3 mg/dL 9.1 8.3(L) 8.2(L)  Total Protein 6.5 - 8.1 g/dL 6.0(L) - -  Total Bilirubin 0.3 - 1.2 mg/dL 0.6 - -  Alkaline Phos 38 - 126 U/L 86 - -  AST 15 - 41 U/L 16 - -  ALT 0 - 44 U/L 24 - -     RADIOGRAPHIC STUDIES: I have personally reviewed the radiological images as listed and agreed with the findings in the report: PET CT scan findings with concern for liver involvement, and lymphadenopathy that is FDG avid on both sides of the diaphragm.  Findings are most consistent with a stage III/IV diffuse large B-cell lymphoma.  NM PET Image Initial (PI) Skull Base To Thigh  Result Date: 06/04/2021 CLINICAL DATA:  Initial treatment strategy for diffuse large B-cell lymphoma. EXAM: NUCLEAR MEDICINE PET SKULL BASE TO THIGH TECHNIQUE: 13.4 mCi F-18 FDG was injected intravenously. Full-ring PET imaging was performed from the skull base to thigh after the radiotracer. CT data was obtained and used for attenuation correction and anatomic localization. Fasting blood glucose: 185 mg/dl COMPARISON:  CT abdomen/pelvis dated 05/23/2021 FINDINGS: Mediastinal blood pool activity: SUV max 3.0 Liver activity: SUV max NA NECK: Bilateral cervical nodes, including a 9 mm short axis right posterior cervical chain node (series 4/image 36) with max SUV 3.2. Incidental CT findings: none CHEST: Bilateral axillary nodes, left greater than right, including a 14 mm short axis node on the left (series 4/image 64) with max SUV 3.3. Mediastinal lymphadenopathy, including an 18 mm short axis high right paratracheal node (series  4/image 64) with max SUV 3.6. Left IMA nodes, left greater than right, including a 1.9 cm short axis node on the left (series 4/image 103) with max SUV 5.7. Incidental CT findings: Atherosclerotic calcifications of the aortic arch. Three vessel coronary atherosclerosis. Hypodense blood pool relative to myocardium, suggesting anemia. Right arm PICC terminates at the cavoatrial junction. ABDOMEN/PELVIS: No abnormal hypermetabolism in the liver, pancreas, or adrenal glands. Splenomegaly, but without focal hypermetabolic lesion. 5.4 x 4.3 cm soft tissue lesion along the medial right lower kidney/right renal collecting system (series 4/image 149), max SUV 13.8. Indwelling right double-pigtail ureteral stent. 2.9 cm short axis jejunal mesenteric nodal mass (series 4/image 134), max SUV 4.6. Retroperitoneal lymphadenopathy, including a 1.6 x 4.0 cm lesion along the anterior aspect of the left common iliac artery (series 4/image 168), max SUV 6.2. Bilateral pelvic lymphadenopathy, including a 17 mm short axis right external iliac node (series 4/image 194), max SUV 3.1. Bilateral inguinal lymphadenopathy, measuring up to 12 mm on the right (series 4/image 29), max SUV 2.9. Incidental CT findings: Atherosclerotic calcifications the abdominal aorta and branch vessels. Prostatomegaly with a mildly thick-walled bladder. SKELETON: Right anterolateral 5th through 9th rib fracture deformities with associated hypermetabolism, max SUV 3.9. Heterogeneous benign focal osseous hypermetabolism, without focal lesion to suggest lymphomatous involvement on PET. Incidental CT findings: Right hip arthroplasty. IMPRESSION: Widespread lymphomatous involvement in the neck, chest, abdomen, and pelvis, as above. Index lesions include a 5.4 cm soft tissue lesion along the medial right lower kidney/renal collecting system and a 2.9 cm short axis jejunal mesentery nodal mass. Splenomegaly. Right anterolateral 5th through 9th rib fractures.  Electronically Signed   By: Julian Hy M.D.   On: 06/04/2021 20:46   DG Chest [facility] 1 View  Result  Date: 05/23/2021 CLINICAL DATA:  Fever EXAM: PORTABLE CHEST 1 VIEW COMPARISON:  05/06/2021 FINDINGS: Low volume AP portable examination with mild, diffuse bilateral interstitial opacity and heterogeneous opacity in the retrocardiac left lung base. Cardiomegaly. The visualized skeletal structures are unremarkable. IMPRESSION: 1. Low volume AP portable examination with mild, diffuse bilateral interstitial opacity and heterogeneous opacity in the retrocardiac left lung base, which may reflect atelectasis in the setting of cardiomegaly or alternately infection/aspiration. 2. Cardiomegaly. Electronically Signed   By: Eddie Candle M.D.   On: 05/23/2021 15:49   ECHOCARDIOGRAM COMPLETE  Result Date: 06/01/2021    ECHOCARDIOGRAM REPORT   Patient Name:   [name] Date of Exam: 06/01/2021 Medical Rec #:  [id number]        Height:       70.0 in Accession #:    [id number]       Weight:       247.6 lb Date of Birth:  [birth year]         BSA:          2.285 m Patient Age:    34 years         BP:           150/79 mmHg Patient Gender: M                HR:           56 bpm. Exam Location:  Inpatient Procedure: 2D Echo, 3D Echo, Cardiac Doppler, Color Doppler and Strain Analysis Indications:    Z51.11 Encounter for antineoplastic chemotheraphy  History:        Patient has prior history of Echocardiogram examinations, most                 recent 05/29/2021. CAD, Signs/Symptoms:Chest Pain; Risk                 Factors:Hypertension, Diabetes and Sleep Apnea. Dysrhthmia.  Sonographer:    Jonelle Sidle Dance Referring Phys: [id number] [place]  1. Left ventricular ejection fraction, by estimation, is 50 to 55%. The left ventricle has low normal function. The left ventricle has no regional wall motion abnormalities. There is mild left ventricular hypertrophy. Left ventricular diastolic parameters are consistent with  Grade I diastolic dysfunction (impaired relaxation). The average left ventricular global longitudinal strain is -16.5 %. The global longitudinal strain is normal.  2. Right ventricular systolic function is normal. The right ventricular size is normal. There is normal pulmonary artery systolic pressure.  3. Left atrial size was mildly dilated.  4. The mitral valve is normal in structure. No evidence of mitral valve regurgitation. No evidence of mitral stenosis.  5. The aortic valve is calcified. There is moderate calcification of the aortic valve. There is mild thickening of the aortic valve. Aortic valve regurgitation is not visualized. Mild aortic valve sclerosis is present, with no evidence of aortic valve stenosis. Aortic valve mean gradient measures 7.0 mmHg. Aortic valve Vmax measures 1.85 m/s.  6. The inferior vena cava is normal in size with greater than 50% respiratory variability, suggesting right atrial pressure of 3 mmHg. FINDINGS  Left Ventricle: Left ventricular ejection fraction, by estimation, is 50 to 55%. The left ventricle has low normal function. The left ventricle has no regional wall motion abnormalities. The average left ventricular global longitudinal strain is -16.5 %. The global longitudinal strain is normal. 3D left ventricular ejection fraction analysis performed but not reported based on interpreter judgement due to suboptimal quality.  The left ventricular internal cavity size was normal in size. There is mild left ventricular hypertrophy. Left ventricular diastolic parameters are consistent with Grade I diastolic dysfunction (impaired relaxation). Right Ventricle: The right ventricular size is normal. No increase in right ventricular wall thickness. Right ventricular systolic function is normal. There is normal pulmonary artery systolic pressure. The tricuspid regurgitant velocity is 2.11 m/s, and  with an assumed right atrial pressure of 3 mmHg, the estimated right ventricular systolic  pressure is 16.1 mmHg. Left Atrium: Left atrial size was mildly dilated. Right Atrium: Right atrial size was normal in size. Pericardium: There is no evidence of pericardial effusion. Mitral Valve: The mitral valve is normal in structure. Mild mitral annular calcification. No evidence of mitral valve regurgitation. No evidence of mitral valve stenosis. Tricuspid Valve: The tricuspid valve is normal in structure. Tricuspid valve regurgitation is not demonstrated. No evidence of tricuspid stenosis. Aortic Valve: The aortic valve is calcified. There is moderate calcification of the aortic valve. There is mild thickening of the aortic valve. Aortic valve regurgitation is not visualized. Mild aortic valve sclerosis is present, with no evidence of aortic valve stenosis. Aortic valve mean gradient measures 7.0 mmHg. Aortic valve peak gradient measures 13.7 mmHg. Aortic valve area, by VTI measures 1.73 cm. Pulmonic Valve: The pulmonic valve was normal in structure. Pulmonic valve regurgitation is not visualized. No evidence of pulmonic stenosis. Aorta: The aortic root is normal in size and structure. Venous: The inferior vena cava is normal in size with greater than 50% respiratory variability, suggesting right atrial pressure of 3 mmHg. IAS/Shunts: No atrial level shunt detected by color flow Doppler.  LEFT VENTRICLE PLAX 2D LVIDd:         5.50 cm  Diastology LVIDs:         4.30 cm  LV e' medial:    5.44 cm/s LV PW:         1.50 cm  LV E/e' medial:  18.6 LV IVS:        1.30 cm  LV e' lateral:   9.36 cm/s LVOT diam:     2.00 cm  LV E/e' lateral: 10.8 LV SV:         74 LV SV Index:   33       2D Longitudinal Strain LVOT Area:     3.14 cm 2D Strain GLS Avg:     -16.5 %  RIGHT VENTRICLE             IVC RV Basal diam:  3.30 cm     IVC diam: 1.80 cm RV Mid diam:    1.70 cm RV S prime:     13.70 cm/s TAPSE (M-mode): 2.1 cm LEFT ATRIUM              Index       RIGHT ATRIUM           Index LA diam:        5.30 cm  2.32 cm/m  RA  Area:     17.10 cm LA Vol (A2C):   96.8 ml  42.35 ml/m RA Volume:   39.30 ml  17.20 ml/m LA Vol (A4C):   104.0 ml 45.50 ml/m LA Biplane Vol: 108.0 ml 47.25 ml/m  AORTIC VALVE AV Area (Vmax):    1.65 cm AV Area (Vmean):   1.69 cm AV Area (VTI):     1.73 cm AV Vmax:           185.00 cm/s AV  Vmean:          121.000 cm/s AV VTI:            0.430 m AV Peak Grad:      13.7 mmHg AV Mean Grad:      7.0 mmHg LVOT Vmax:         97.30 cm/s LVOT Vmean:        64.900 cm/s LVOT VTI:          0.237 m LVOT/AV VTI ratio: 0.55  AORTA Ao Root diam: 3.40 cm Ao Asc diam:  4.50 cm MITRAL VALVE                TRICUSPID VALVE MV Area (PHT): 2.34 cm     TR Peak grad:   17.8 mmHg MV Decel Time: 324 msec     TR Vmax:        211.00 cm/s MV E velocity: 101.00 cm/s MV A velocity: 63.00 cm/s   SHUNTS MV E/A ratio:  1.60         Systemic VTI:  0.24 m                             Systemic Diam: 2.00 cm Candee Furbish MD Electronically signed by Candee Furbish MD Signature Date/Time: 06/01/2021/1:40:26 PM    Final    ECHOCARDIOGRAM COMPLETE  Result Date: 05/24/2021    ECHOCARDIOGRAM REPORT   Patient Name:   CHIBUEZE BEASLEY Date of Exam: 05/24/2021 Medical Rec #:  [id number]        Height:       70.0 in Accession #:    [id number]       Weight:       252.3 lb Date of Birth:  August 13, 1946         BSA:          2.304 m Patient Age:    32 years         BP:           149/73 mmHg Patient Gender: M                HR:           62 bpm. Exam Location:  Inpatient Procedure: 2D Echo, Cardiac Doppler and Color Doppler Indications:    Cardiomegaly  History:        Patient has no prior history of Echocardiogram examinations,                 most recent 05/13/2021. Angina and CAD, Arrythmias:Atrial                 Fibrillation; Risk Factors:Diabetes, Dyslipidemia and                 Hypertension.  Sonographer:    Luisa Hart RDCS Referring Phys: [place]  Sonographer Comments: Patient is morbidly obese. Image acquisition challenging due to patient  body habitus and Image acquisition challenging due to respiratory motion. IMPRESSIONS  1. No obvious regional wall motion abnormaliies are seen, but the apical views are difficult to evaluate. Left ventricular ejection fraction, by estimation, is 45 to 50%. The left ventricle has mildly decreased function. Left ventricular endocardial border not optimally defined to evaluate regional wall motion. The left ventricular internal cavity size was mildly dilated. Left ventricular diastolic parameters are consistent with Grade II diastolic dysfunction (pseudonormalization). Elevated left atrial pressure.  2. Right ventricular systolic function is  normal. The right ventricular size is mildly enlarged. There is mildly elevated pulmonary artery systolic pressure. The estimated right ventricular systolic pressure is 66.5 mmHg.  3. Left atrial size was moderately dilated.  4. Right atrial size was mildly dilated.  5. The mitral valve is normal in structure. No evidence of mitral valve regurgitation.  6. The aortic valve is tricuspid. There is mild calcification of the aortic valve. There is mild thickening of the aortic valve. Aortic valve regurgitation is not visualized. Mild to moderate aortic valve sclerosis/calcification is present, without any evidence of aortic stenosis.  7. Aortic dilatation noted. There is mild dilatation of the aortic root, measuring 40 mm. FINDINGS  Left Ventricle: No obvious regional wall motion abnormaliies are seen, but the apical views are difficult to evaluate. Left ventricular ejection fraction, by estimation, is 45 to 50%. The left ventricle has mildly decreased function. Left ventricular endocardial border not optimally defined to evaluate regional wall motion. The left ventricular internal cavity size was mildly dilated. There is no left ventricular hypertrophy. Left ventricular diastolic parameters are consistent with Grade II diastolic dysfunction (pseudonormalization). Elevated left atrial  pressure. Right Ventricle: The right ventricular size is mildly enlarged. No increase in right ventricular wall thickness. Right ventricular systolic function is normal. There is mildly elevated pulmonary artery systolic pressure. The tricuspid regurgitant velocity is 2.89 m/s, and with an assumed right atrial pressure of 3 mmHg, the estimated right ventricular systolic pressure is 99.3 mmHg. Left Atrium: Left atrial size was moderately dilated. Right Atrium: Right atrial size was mildly dilated. Pericardium: There is no evidence of pericardial effusion. Mitral Valve: The mitral valve is normal in structure. Mild mitral annular calcification. No evidence of mitral valve regurgitation. Tricuspid Valve: The tricuspid valve is not well visualized. Tricuspid valve regurgitation is trivial. Aortic Valve: The aortic valve is tricuspid. There is mild calcification of the aortic valve. There is mild thickening of the aortic valve. Aortic valve regurgitation is not visualized. Mild to moderate aortic valve sclerosis/calcification is present, without any evidence of aortic stenosis. Aortic valve mean gradient measures 9.0 mmHg. Aortic valve peak gradient measures 16.2 mmHg. Aortic valve area, by VTI measures 2.84 cm. Pulmonic Valve: The pulmonic valve was grossly normal. Pulmonic valve regurgitation is not visualized. Aorta: Aortic dilatation noted. There is mild dilatation of the aortic root, measuring 40 mm. IAS/Shunts: No atrial level shunt detected by color flow Doppler.  LEFT VENTRICLE PLAX 2D LVIDd:         5.87 cm      Diastology LVIDs:         4.59 cm      LV e' medial:    5.87 cm/s LV PW:         0.80 cm      LV E/e' medial:  18.1 LV IVS:        0.80 cm      LV e' lateral:   7.40 cm/s LVOT diam:     2.30 cm      LV E/e' lateral: 14.3 LV SV:         108 LV SV Index:   47 LVOT Area:     4.15 cm  LV Volumes (MOD) LV vol d, MOD A2C: 62.7 ml LV vol d, MOD A4C: 116.0 ml LV vol s, MOD A2C: 28.6 ml LV vol s, MOD A4C: 71.8  ml LV SV MOD A2C:     34.1 ml LV SV MOD A4C:     116.0 ml LV  SV MOD BP:      44.3 ml RIGHT VENTRICLE RV Basal diam:  4.35 cm RV Mid diam:    2.80 cm RV S prime:     17.40 cm/s TAPSE (M-mode): 2.7 cm LEFT ATRIUM             Index       RIGHT ATRIUM           Index LA diam:        4.50 cm 1.95 cm/m  RA Area:     20.70 cm LA Vol (A2C):   37.4 ml 16.23 ml/m RA Volume:   62.00 ml  26.91 ml/m LA Vol (A4C):   57.0 ml 24.74 ml/m LA Biplane Vol: 46.8 ml 20.31 ml/m  AORTIC VALVE                    PULMONIC VALVE AV Area (Vmax):    3.06 cm     PV Vmax:       1.08 m/s AV Area (Vmean):   3.09 cm     PV Vmean:      78.400 cm/s AV Area (VTI):     2.84 cm     PV VTI:        0.234 m AV Vmax:           201.00 cm/s  PV Peak grad:  4.7 mmHg AV Vmean:          137.000 cm/s PV Mean grad:  3.0 mmHg AV VTI:            0.379 m AV Peak Grad:      16.2 mmHg AV Mean Grad:      9.0 mmHg LVOT Vmax:         148.00 cm/s LVOT Vmean:        102.000 cm/s LVOT VTI:          0.259 m LVOT/AV VTI ratio: 0.68  AORTA Ao Root diam: 4.00 cm Ao Asc diam:  3.50 cm MITRAL VALVE                TRICUSPID VALVE MV Area (PHT): 3.74 cm     TR Peak grad:   33.4 mmHg MV Decel Time: 203 msec     TR Vmax:        289.00 cm/s MV E velocity: 106.00 cm/s MV A velocity: 45.40 cm/s   SHUNTS MV E/A ratio:  2.33         Systemic VTI:  0.26 m                             Systemic Diam: 2.30 cm Dani Gobble Croitoru MD Electronically signed by Sanda Klein MD Signature Date/Time: 05/24/2021/2:39:41 PM    Final    CT Renal Stone Study  Result Date: 05/23/2021 CLINICAL DATA:  Hydronephrosis, weakness, recently completed antibiotics. Persistent fever EXAM: CT ABDOMEN AND PELVIS WITHOUT CONTRAST TECHNIQUE: Multidetector CT imaging of the abdomen and pelvis was performed following the standard protocol without IV contrast. COMPARISON:  CT 04/30/2021, CT 05/04/2021, ultrasound 05/06/2021 FINDINGS: Lower chest: Small bilateral effusions, new from prior. Some additional more  consolidative opacity the left lung base, could be atelectatic or airspace disease. Diffuse interlobular septal thickening and fissural thickening. Cardiac size within normal limits. Coronary artery atherosclerosis. No pericardial effusion. Hepatobiliary: No worrisome focal liver lesions. Smooth liver surface contour. Normal hepatic attenuation. Normal gallbladder and biliary tree. Pancreas: Partial fatty  replacement of the pancreas. No pancreatic ductal dilatation or surrounding inflammatory changes. Spleen: Normal in size. No concerning splenic lesions. Adrenals/Urinary Tract: Normal adrenal glands. Bilateral perinephric stranding, similar to slightly increased from comparison prior. A right double-J nephroureteral stent is in place at the time of exam. Proximal and appropriately terminating in the renal pelvis. Distal and terminating within the urinary bladder. No residual right hydronephrosis. Redemonstration of a heterogeneous mass at the right ureteropelvic junction which appears external to the ureter and kidney, measuring approximately 5.4 x 3.5 cm in size, previously 4.5 x 3 cm in size. Suspect several left parapelvic cysts. No concerning left pelvic lesion. Few small left renal cyst corresponding well to those seen on comparison ultrasound and CT imaging. Urinary bladder is largely decompressed by inflated Foley catheter. Small amount of joint luminal gas is nonspecific given instrumentation. Some degree of bladder wall thickening is expected given underdistention though faint perivesicular hazy stranding is present. Portions of the bladder obscured by streak artifact from a right hip arthroplasty. Stomach/Bowel: Distal esophagus, stomach and duodenal sweep are unremarkable. No small bowel wall thickening or dilatation. No evidence of obstruction. A normal appendix is visualized. No colonic dilatation or wall thickening. Scattered colonic diverticula without focal inflammation to suggest diverticulitis.  Vascular/Lymphatic: Extensive mesenteric infiltration. Pathologic mesenteric adenopathy throughout the mid mesentery with a larger dominant mass measuring 5.6 by 3.6 cm in size which appears to decreased in the interim having previously measured 6.7 x 4.1 cm at similar level. There is persistent pathologic adenopathy throughout the abdomen and pelvis most pronounced in the periaortic chains and central mesentery. Index right common iliac node measures 1.8 cm in short axis (2/63), not significantly changed from prior. Additional index right pelvic sidewall node measuring 1 cm short axis is unchanged from prior as well (2/75). Atherosclerotic plaque throughout the abdominal aorta and branch vessels. No aneurysm or ectasia. Reproductive: Mild prostatomegaly. Seminal vesicles are unremarkable. Other: Mesenteric infiltration. Perinephric retroperitoneal stranding and trace free fluid in the pararenal spaces. No bowel containing hernia. No free air. Musculoskeletal: The osseous structures appear diffusely demineralized which may limit detection of small or nondisplaced fractures. No acute osseous abnormality or suspicious osseous lesion. Prior right hip arthroplasty in expected alignment. No acute or suspicious osseous lesions. IMPRESSION: 1. 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 A lobulated retroperitoneal soft tissue mass towards the right ureteropelvic junction with resulting compressive mass effect upon the collecting system has slightly increased in size from prior now measuring 5.4 x 3.5 cm. A double-J nephroureteral stent remains in place without significant residual hydronephrosis at this time. 2. A separate large deposit in the upper mesentery as seemingly decreased in the interim while other index lymph nodes remain stable from prior. 3. Extensive perinephric, periureteral and perivesicular stranding, concerning for an  ascending tract infection in the setting of sepsis. 4. Patchy area of opacity in posterior left lung base, possibly atelectatic or airspace disease with additional features of developing pulmonary edema and bilateral pleural effusions. 5. Prostatomegaly. 6. Prior right hip arthroplasty. 7. Aortic Atherosclerosis (ICD10-I70.0). 8. Coronary atherosclerosis. These results were called by telephone at the time of interpretation on 05/23/2021 at 9:46 pm to provider Doutova MD, who verbally acknowledged these results. Electronically Signed   By: Lovena Le M.D.   On: 05/23/2021 21:46   ECHO TEE  Result Date: 05/29/2021    TRANSESOPHOGEAL ECHO REPORT   Patient Name:   [name] Date of Exam: 05/29/2021  Medical Rec #:  [id number]        Height:       70.0 in Accession #:    [id number]       Weight:       257.5 lb Date of Birth:  1946/10/05         BSA:          2.324 m Patient Age:    32 years         BP:           159/74 mmHg Patient Gender: M                HR:           56 bpm. Exam Location:  Outpatient Procedure: 2D Echo Indications:     Bacteremia  History:         Patient has prior history of Echocardiogram examinations, most                  recent 05/24/2021. CAD; Risk Factors:Diabetes, Sleep Apnea and                  Dyslipidemia.  Sonographer:     Johny Chess Referring Phys:  Nancy Fetter DUNN Diagnosing Phys: Rudean Haskell MD PROCEDURE: After discussion of the risks and benefits of a TEE, an informed consent was obtained from the patient. The transesophogeal probe was passed without difficulty through the esophogus of the patient. Imaged were obtained with the patient in a left lateral decubitus position. Sedation performed by different physician. The patient was monitored while under deep sedation. Anesthestetic sedation was provided intravenously by Anesthesiology: 339m of Propofol. The patient developed no complications during the procedure. IMPRESSIONS  1. The mitral valve is  degenerative. There is a <1 cm echodensity on the atrial surface on the mitral valve without associated regurgitation. Echodensity is most consistent with valvular calcification, though comparison imaging not available. Trivial mitral valve regurgitation. No evidence of mitral stenosis.  2. Aortic dilatation noted. There is moderate dilatation of the ascending aorta, measuring 47 mm. There is Moderate (Grade III) plaque involving the transverse aorta.  3. Left ventricular ejection fraction, by estimation, is 45 to 50%. The left ventricle has mildly decreased function.  4. Right ventricular systolic function is normal. The right ventricular size is normal.  5. No left atrial/left atrial appendage thrombus was detected. The LAA emptying velocity was 41 cm/s.  6. The aortic valve is tricuspid. There is mild calcification of the aortic valve. There is mild thickening of the aortic valve. Aortic valve regurgitation is not visualized. Mild aortic valve sclerosis is present, with no evidence of aortic valve stenosis. Conclusion(s)/Recommendation(s): Consider follow-up imaging for ascending aortic size in 6 months (CTA or MRA). FINDINGS  Left Ventricle: Left ventricular ejection fraction, by estimation, is 45 to 50%. The left ventricle has mildly decreased function. The left ventricular internal cavity size was normal in size. Right Ventricle: The right ventricular size is normal. Right vetricular wall thickness was not assessed. Right ventricular systolic function is normal. Left Atrium: Left atrial size was normal in size. No left atrial/left atrial appendage thrombus was detected. The LAA emptying velocity was 41 cm/s. Right Atrium: Right atrial size was normal in size. Pericardium: There is no evidence of pericardial effusion. Mitral Valve: The mitral valve is degenerative in appearance. Trivial mitral valve regurgitation. No evidence of mitral valve stenosis. Tricuspid Valve: The tricuspid valve is normal in structure.  Tricuspid valve regurgitation is  trivial. Aortic Valve: The aortic valve is tricuspid. There is mild calcification of the aortic valve. There is mild thickening of the aortic valve. There is mild aortic valve annular calcification. Aortic valve regurgitation is not visualized. Mild aortic valve sclerosis is present, with no evidence of aortic valve stenosis. Pulmonic Valve: The pulmonic valve was grossly normal. Pulmonic valve regurgitation is not visualized. Aorta: The aortic root is normal in size and structure and aortic dilatation noted. There is moderate dilatation of the ascending aorta, measuring 47 mm. There is moderate (Grade III) plaque involving the transverse aorta. IAS/Shunts: The atrial septum is grossly normal.  LEFT VENTRICLE PLAX 2D LVOT diam:     2.60 cm LVOT Area:     5.31 cm   AORTA Ao Root diam: 3.70 cm Ao Asc diam:  4.70 cm  SHUNTS Systemic Diam: 2.60 cm Rudean Haskell MD Electronically signed by Rudean Haskell MD Signature Date/Time: 05/29/2021/5:10:33 PM    Final    [place] EKG SITE RITE  Result Date: 05/28/2021 If Site Rite image not attached, placement could not be confirmed due to current cardiac rhythm.   ASSESSMENT & PLAN [name] 75 y.o. male with medical history significant for stage III/IV diffuse large B-cell lymphoma presents for a follow up visit.    After review of the labs, review of the records, and discussion with the patient the patients findings are most consistent with advanced stage diffuse large B-cell lymphoma.  The patient appears to have splenic involvement which we will confirm with the PET CT scan.  Additionally we do not have any information above the diaphragm which will be aided with further PET/CT.  Patient does have a cardiac history significant for atrial fibrillation on amiodarone and therefore we may need to consider dropping anthracyclines from his regimen. We are planning for an R-CEOP based regimen moving forward.    *Of note,  today we discussed the prognosis, the treatment, and all other concerns regarding the patient's treat moving forward.  I did request to the translator relay to the patient that he has stage III/IV disease.  She notes that this was not something that she was able to translate as in [place] culture this is not something that would be said directly to the patient's face.  She thought that this may be devastating to him.  We did discuss everything else regarding the prognosis of stage and the CT scan findings, but the exact word "stage" was not used with him per the advice of the interpreter.  DLBCL IPI Score: 2 points, good prognosis. 80% progression free survival    # Diffuse Large B Cell Lymphoma, ABC subtype. Stage III/IV -- Findings at this time are most consistent with stage III/IV diffuse large B-cell lymphoma. --We will plan to start patient on R-CEOP chemotherapy given his cardiac issues and mildly reduced EF(he is on amiodarone for atrial fibrillation) -- Plan to return to clinic next week to start treatment.   #Supportive Care -- chemotherapy education to be scheduled -- port placement to be scheduled after his PICC is removed. We will use the PICC for now.  -- zofran 46m q8H PRN and compazine 186mPO q6H for nausea -- allopurinol 30036mO daily for TLS prophylaxis -- EMLA cream for port -- no pain medication required at this time.   No orders of the defined types were placed in this encounter.   All questions were answered. The patient knows to call the clinic with any problems, questions or concerns.  A total of more than 30 minutes were spent on this encounter with face-to-face time and non-face-to-face time, including preparing to see the patient, ordering tests and/or medications, counseling the patient and coordination of care as outlined above.   Ledell Peoples, MD Department of Hematology/Oncology [place] Phone: [phone] Pager:  [phone] Email: [email or web address]  06/07/2021 5:36 PM

## 2021-06-07 ENCOUNTER — Encounter: Payer: Self-pay | Admitting: Hematology and Oncology

## 2021-06-09 ENCOUNTER — Other Ambulatory Visit: Payer: Self-pay | Admitting: Hematology and Oncology

## 2021-06-09 ENCOUNTER — Inpatient Hospital Stay: Payer: Medicare HMO

## 2021-06-09 ENCOUNTER — Other Ambulatory Visit: Payer: Self-pay

## 2021-06-09 VITALS — BP 131/61 | HR 69 | Temp 97.9°F | Resp 20 | Wt 248.8 lb

## 2021-06-09 DIAGNOSIS — Z5112 Encounter for antineoplastic immunotherapy: Secondary | ICD-10-CM | POA: Diagnosis not present

## 2021-06-09 DIAGNOSIS — C8338 Diffuse large B-cell lymphoma, lymph nodes of multiple sites: Secondary | ICD-10-CM

## 2021-06-09 MED ORDER — SODIUM CHLORIDE 0.9 % IV SOLN
50.0000 mg/m2 | Freq: Once | INTRAVENOUS | Status: AC
  Administered 2021-06-09: 120 mg via INTRAVENOUS
  Filled 2021-06-09: qty 6

## 2021-06-09 MED ORDER — DIPHENHYDRAMINE HCL 25 MG PO CAPS
50.0000 mg | ORAL_CAPSULE | Freq: Once | ORAL | Status: AC
  Administered 2021-06-09: 50 mg via ORAL

## 2021-06-09 MED ORDER — PALONOSETRON HCL INJECTION 0.25 MG/5ML
0.2500 mg | Freq: Once | INTRAVENOUS | Status: AC
  Administered 2021-06-09: 0.25 mg via INTRAVENOUS

## 2021-06-09 MED ORDER — PALONOSETRON HCL INJECTION 0.25 MG/5ML
INTRAVENOUS | Status: AC
  Filled 2021-06-09: qty 5

## 2021-06-09 MED ORDER — VINCRISTINE SULFATE CHEMO INJECTION 1 MG/ML
2.0000 mg | Freq: Once | INTRAVENOUS | Status: AC
  Administered 2021-06-09: 2 mg via INTRAVENOUS
  Filled 2021-06-09: qty 2

## 2021-06-09 MED ORDER — SODIUM CHLORIDE 0.9 % IV SOLN
750.0000 mg/m2 | Freq: Once | INTRAVENOUS | Status: AC
  Administered 2021-06-09: 1740 mg via INTRAVENOUS
  Filled 2021-06-09: qty 87

## 2021-06-09 MED ORDER — ACETAMINOPHEN 325 MG PO TABS
ORAL_TABLET | ORAL | Status: AC
  Filled 2021-06-09: qty 2

## 2021-06-09 MED ORDER — ACETAMINOPHEN 325 MG PO TABS
650.0000 mg | ORAL_TABLET | Freq: Once | ORAL | Status: AC
  Administered 2021-06-09: 650 mg via ORAL

## 2021-06-09 MED ORDER — SODIUM CHLORIDE 0.9 % IV SOLN
Freq: Once | INTRAVENOUS | Status: AC
Start: 2021-06-09 — End: 2021-06-09
  Filled 2021-06-09: qty 250

## 2021-06-09 MED ORDER — ALLOPURINOL 300 MG PO TABS: 300.0000 mg | ORAL_TABLET | Freq: Every day | ORAL | 1 refills | Status: DC

## 2021-06-09 MED ORDER — SODIUM CHLORIDE 0.9 % IV SOLN: 375.0000 mg/m2 | Freq: Once | INTRAVENOUS | Status: DC

## 2021-06-09 MED ORDER — PROCHLORPERAZINE MALEATE 10 MG PO TABS
10.0000 mg | ORAL_TABLET | Freq: Four times a day (QID) | ORAL | 0 refills | Status: DC | PRN
Start: 2021-06-09 — End: 2022-02-01

## 2021-06-09 MED ORDER — PREDNISONE 20 MG PO TABS: 60.0000 mg | ORAL_TABLET | Freq: Every day | ORAL | 5 refills | Status: DC

## 2021-06-09 MED ORDER — SODIUM CHLORIDE 0.9 % IV SOLN
10.0000 mg | Freq: Once | INTRAVENOUS | Status: AC
  Administered 2021-06-09: 10 mg via INTRAVENOUS
  Filled 2021-06-09: qty 10

## 2021-06-09 MED ORDER — DIPHENHYDRAMINE HCL 25 MG PO CAPS
ORAL_CAPSULE | ORAL | Status: AC
  Filled 2021-06-09: qty 2

## 2021-06-09 NOTE — Progress Notes (Signed)
z

## 2021-06-09 NOTE — Progress Notes (Signed)
Ok to treat with elevated Scr and ANC of 1.2 per Dr.Dorsey. No rituximab today, only vincristine, etopoiside and cytoxan.

## 2021-06-09 NOTE — Addendum Note (Signed)
Addended by: Neysa Hotter on: 06/09/2021 11:08 PM   Modules accepted: Orders

## 2021-06-09 NOTE — Patient Instructions (Signed)
[facility]  Discharge Instructions: Thank you for choosing [facility] to provide your oncology and hematology care.   If you have a lab appointment with the [facility], please go directly to the [facility] and check in at the registration area.   Wear comfortable clothing and clothing appropriate for easy access to any [facility] or PICC line.   We strive to give you quality time with your provider. You may need to reschedule your appointment if you arrive late (15 or more minutes).  Arriving late affects you and other patients whose appointments are after yours.  Also, if you miss three or more appointments without notifying the office, you may be dismissed from the clinic at the provider's discretion.      For prescription refill requests, have your pharmacy contact our office and allow 72 hours for refills to be completed.    Today you received the following chemotherapy and/or immunotherapy agents vincristine, etoposide, cytoxan      To help prevent nausea and vomiting after your treatment, we encourage you to take your nausea medication as directed.  BELOW ARE SYMPTOMS THAT SHOULD BE REPORTED IMMEDIATELY: *FEVER GREATER THAN 100.4 F (38 C) OR HIGHER *CHILLS OR SWEATING *NAUSEA AND VOMITING THAT IS NOT CONTROLLED WITH YOUR NAUSEA MEDICATION *UNUSUAL SHORTNESS OF BREATH *UNUSUAL BRUISING OR BLEEDING *URINARY PROBLEMS (pain or burning when urinating, or frequent urination) *BOWEL PROBLEMS (unusual diarrhea, constipation, pain near the anus) TENDERNESS IN MOUTH AND THROAT WITH OR WITHOUT PRESENCE OF ULCERS (sore throat, sores in mouth, or a toothache) UNUSUAL RASH, SWELLING OR PAIN  UNUSUAL VAGINAL DISCHARGE OR ITCHING   Items with * indicate a potential emergency and should be followed up as soon as possible or go to the Emergency Department if any problems should occur.  Please show the CHEMOTHERAPY ALERT CARD or IMMUNOTHERAPY ALERT  CARD at check-in to the Emergency Department and triage nurse.  Should you have questions after your visit or need to cancel or reschedule your appointment, please contact [facility]  Dept: [phone]  and follow the prompts.  Office hours are 8:00 a.m. to 4:30 p.m. Monday - Friday. Please note that voicemails left after 4:00 p.m. may not be returned until the following business day.  We are closed weekends and major holidays. You have access to a nurse at all times for urgent questions. Please call the main number to the clinic Dept: [phone] and follow the prompts.   For any non-urgent questions, you may also contact your provider using MyChart. We now offer e-Visits for anyone 24 and older to request care online for non-urgent symptoms. For details visit [email or web address].   Also download the MyChart app! Go to the app store, search "MyChart", open the app, select [facility], and log in with your MyChart username and password.  Due to Covid, a mask is required upon entering the hospital/clinic. If you do not have a mask, one will be given to you upon arrival. For doctor visits, patients may have 1 support person aged 69 or older with them. For treatment visits, patients cannot have anyone with them due to current Covid guidelines and our immunocompromised population.   Vincristine injection ??? ???????????? ????? ??? ?????????? ?????????? ??? ???????????????????? ????????. ?? ????????? ???? ??????????????? ??????. ??? ????????? ??????????? ??? ??????? ?????? ????? ??????????????? ????????, ????? ??? ??????? ????????, ??????, ????????????? ???????,????????????? (??????? ????????? ?????), ??????????????? ? ??????? ???????. ??? ????????? ????? ??????????? ? ?????? ?????; ???? ? ??? ????????? ???????,?????????? ? ???????????? ????????? ??? ??????????. ???????????????? ????????? ???????? (????????):  Oncovin, Vincasar PFS ??? ??? ??????? ??????????  ???????????? ????????? ?? ?????? ?????? ??????????????? ?????????? ?????, ???? ?? ? ??? ???? ?? ????????? ?????????: ??????????? ?????; ???????; ???????? (???????? ???????? ????, ????????????? ????????? ??? ??????); ??????????? ?????; ??????????? ??????; ??????????? ??????; ??????????? ??????? ???????, ????????, ??????? ?????-????-???? (???); ???????????? ? ???????? ??????? ??? ? ????????? ?????; ????????? ??? ????????????? ??????? ?? ?????????? ??? ?????? ???????????????????? ?????????; ????????? ??? ????????????? ??????? ?? ?????? ?????????, ??????? ????????, ????????? ??? ???????????; ???????????? ??? ???????????? ????????????; ????????? ??????; ??? ??? ??????? ????????? ??? ?????????? ??? ????????? ????????????? ??? ????????????? ????????. ?? ???????? ? ???????? ??? ??????? ?????????? ????????? ??????????? ??????????. ??? ????????? ????, ?????, ?????? ??? ?????? ????????? ???????? ? ????? ???????? ?????????????????? ?? ???? ????? ??? ??????? ???????????? ?????????. ???????? ???????? ? ??????????? ?????????? ????? ????????? ?????. ???????? ?? ??, ??? ??? ????????? ????? ??????????? ??? ????????? ??????????, ???????????????? ???? ????????????????. ?????????????: ???? ??? ???????, ??? ?? ????????? ?????????? ???? ????? ?????????, ?????????? ?????????? ? ????????????????? ????? ??? ???????? ????????? ??? ???????? ?????????? ??????. ??????????: ??? ?????????????????????? ?????? ??? ???. ?? ???????? ?? ? ??????? ??????. ??? ?????????? ? ?????? ???????? ?????? ?????????? ????? ????????? ????????? ?????????. ???????? ????? ??? ??????? ?????????????????????, ???? ?? ?? ?????? ??????? ?? ????? ? ??????????? ?????. ? ??? ??? ????????? ????? ???????? ?? ??????????????? ????????? ???????????????? ?????????, ????? ??? ???????????, ???????????, ???????????, ???????????; ????????? ????????????????? ?????????, ????? ??? ????????; ???? ???????? ?? ???????? ???? ????????? ??????????????. ????????????  ???????????? ????????? ?????? ???? ??????????? ???? ????????, ????????????? ????????, ?????????????? ?????????? ? ??????? ???????. ????? ???????? ??? ? ???????, ???????????? ??????????? ???????? ??? ??????????. ????????? ????????????? ???????? ?? ?????????????? ? ??????????? ???? ??????????. ?? ??? ????? ???????? ???????? ??? ????????????? ????? ?????????? ??? ????????? ????? ??????? ????????? ?????? ????????????. ??? ??????????? ???????, ????????? ??? ???????????? ?????? ?? ???????????????? ????? ?????????? ? ???????? ??????. ????????? ? ????? ???????? ????????. ??????????? ??????????? ???? ? ?????? ??????? ????????????, ???? ???? ?? ???????????? ????. ?? ????? ?????? ????? ????????? ??? ??????? ????????? ??????? ??????? ?????. ??? ????????? ???????? ?????. ???????????? ?????????? ????????, ?? ??????? ????, ?????? 2-3 ???. ? ?????? ?????????? ????? ? ??????? 3 ???? ?????????? ?????? ??? ??????? ???????????? ?????????. ? ????????? ??????? ??????????? ?????????????? ????????? ??? ?????? ? ??????????????????. ???????? ????????? ?? ?? ??????????. ??? ?????? ????? ????????? ??????? ???????? ????????????. ???????? ??????? ???????? ????? ? ???????????? ???????????? ??? ????????????? ? ????????? ????????????. ?????????? ??????????? ???????? ????? ????????? ?? ????. ?? ?????????????? ??????????? ?????????? ? ?????, ??????? ???????????? ????????? ??? ??????????. ??? ?????? ????? ????????? ??????? ???????? ????????? ?????????????. ??? ????????? ????? ???????? ??????????? ? ???????. ? ?????? ???????????? ???????????? ?????????????? ??????? ???????? ???? ?????? ? ?????? ??? ????????????????? ??????????. ????? ???????? ??????? ????? ???? ??? ?????? ????? ?????????? ???????? ???????, ? ??????? ??? ??????? ??? ????? ?????? ???????? ????? ?????????? ???????????? ?????????: ????????????? ???????, ????? ??? ?????? ????, ???, ??????????, ????????? ????, ??? ??? ?????; ??????????? ???????; ??????????? ???????? ??? ?????????  ?????? ??? ??????????; ?????; ??????; ???? ?? ???; ???????? ????????; ??????? ??? ?????; ????, ?????????, ??????????? ??? ??????????? ? ????? ????????; ????, ???????? ??? ??????????? ? ?????? ? ??????; ????????? ??????????, ???? ? ???????; ?????????? ???????? (??????????); ???? ? ???????; ??????????? ??? ?????????????? ??? ????????? ?????????? ?????????? ????; ???????? ???????, ??????? ?????? ?? ??????? ???????????? ???????????? ???????? (???????? ????? ??? ??????? ???????????? ?????????, ???? ??? ???????????? ????????????? ??????????): ??????; ?????????; ???? ? ???????; ?????? ????????; ???? ???????? ?? ???????? ???? ????????? ???????? ????????. ?????????? ? ????? ?? ??????? ???????????? ???????? ????????. ?? ?????? ???????? ? ???????????????? ? FDA ?? ???????? [id number]. ??? ??????? ??????? ??? ?????????? ??? ????????? ???????? ?????? ??? ?????? ??????????? ?????????? ? ???????? ??????????. ??? ????????? ?? ???????? ??? ???????? ? ???????? ????????. ??????????: ???? ???????? ?? ???????? ???? ????????? ????????. ???? ? ??? ????????? ???????, ?????????? ??????? ?????????, ?????????? ? ?????, ????????????? ??????? ???????????? ?????????.  2022 Elsevier/Gold Standard (2020-03-06 00:00:00)  Etoposide, VP-16 injection ??? ???????????? ????? ??? ?????????? ????????, VP-16, ??? ???????????????????? ????????. ?? ??????????? ??? ??????????? ?????, ???? ?????? ? ?????? ??????????????? ????????. ??? ????????? ????? ??????????? ? ?????? ?????; ???? ? ??? ????????? ???????,?????????? ? ???????????? ????????? ??? ??????????. ???????????????? ????????? ???????? (????????): Etopophos, Toposar, VePesid ??? ??? ??????? ?????????? ???????????? ????????? ?? ?????? ?????? ??????????????? ?????????? ?????, ???? ?? ? ??? ???? ?? ????????? ?????????: ????????; ??????????? ?????; ??????????? ??????; ????? ?????????? ?????? ?????, ????? ??? ??????????, ??????????? ??? ???????????; ????????? ???  ????????????? ??????? ?? ????????; ????????? ??? ????????????? ??????? ?? ?????? ?????????; ????????? ??? ????????????? ??????? ?? ??????? ????????, ????????? ??? ???????????; ???????????? ??? ???????????? ????????????; ????????? ??????; ??? ??? ??????? ????????? ??? ?????????? ??? ????????? ????????????? ??? ????????????? ????????. ?? ???????? ? ??????????? ??????? ?????????? ????????? ??????????? ??????????. ???????? ???????? ? ??????????? ?????????? ????? ????????? ?????. ? ???? ???????????????? ?????? ????????????. ?????????????: ???? ??? ???????, ??? ?? ????????? ?????????? ???? ????? ?????????, ?????????? ?????????? ? ????????????????? ????? ??? ???????? ????????? ??? ???????? ?????????? ??????. ??????????: ??? ?????????????????????? ?????? ??? ???. ?? ???????? ?? ? ??????? ??????. ??? ?????????? ? ?????? ???????? ?????? ?????????? ????? ????????? ????????? ?????????. ???????? ????? ??? ??????? ?????????????????????, ???? ?? ?? ?????? ??????? ?? ????? ? ??????????? ?????. ? ??? ??? ????????? ????? ???????? ?? ??????????????? ??? ????????? ????? ????????????????? ?? ?????????? ???????????: ????????; ???? ???????? ?? ???????? ???? ????????? ??????????????. ???????????? ???????????? ????????? ?????? ???? ??????????? ???? ????????, ????????????? ????????, ?????????????? ?????????? ? ??????? ???????. ????? ???????? ??? ? ???????, ???????????? ??????????? ???????? ??? ??????????. ????????? ????????????? ???????? ?? ?????????????? ? ??????????? ???? ??????????. ?? ??? ????? ???????? ???????? ??? ????????????? ????? ?????????? ????????? ????????? ????? ??? ??????? ???????????? ????????? ??? ?????????? ?? ????? ??????????. ??? ????????? ????? ??????? ????????? ?????? ????????????. ??? ??????????? ???????, ????????? ??? ???????????? ?????? ?? ???????????????? ????? ?????????? ? ???????? ??????. ????????? ? ????? ???????? ????????. ??????????? ???? ??????? ???? ? ?????? ??????? ????????????, ???? ????  ?????????????? ????. ? ????????? ??????? ??????????? ?????????????? ????????? ??? ?????? ? ??????????????????. ???????? ????????? ?? ?? ??????????. ?????????? ?? ??????? ? ????? ??? ??????? ???????????? ????????? ? ?????? ????????? ???????????, ??????, ???? ? ????? ??? ?????? ????????? ???????? ??? ??????. ?? ??????????? ????????????. ??? ????????? ????? ??????? ??????????? ?????? ????????? ????????????? ?????????. ???????????? ???????? ???????? ???????????? ??????. ??? ????????? ???????????? ??????????? ????????????? ??? ????????? ??????????????. ? ?????? ?????????? ???????????? ?????????? ? ????? ??? ??????????????????? ?????????. ???????? ? ?????? ???? ???????? ? ??? ???????????????? ???????????????. ??? ?????? ????? ????????? ???????? ????????? ????? ????????? ????? ??????????????????????????????. ????????? ???????????? ?? ????? ?????? ????? ????????? ? ? ??????? ??? ??????? 6 ??????? ????? ??? ???????????. ???????? ??????? ???????? ????? ? ???????????? ???????????? ??? ????????????? ? ????????? ????????????. ????? ??????? ?????? ????? ????????? ? ????????? ? ???????????? ?????? ????????? ????? ?? ???????????? ?????? ???? ?????????????. ?????????? ??????????? ???????? ????? ????????? ?? ????. ?? ?????????????? ??????????? ?????????? ? ?????, ??????? ???????????? ????????? ??? ??????????. ??? ?????? ????? ????????? ??????????????? ????????? ??????? ??????. ???????, ??????????? ??? ?????????, ??? ??????? ????????? ? ????????? ?????? ???????????? ????????? ???????????? ?? ?????????? ????? ??????? ??????? ? ? ??????? ??? ??????? 4 ??????? ????? ??? ?????????. ????????? ??????????? ????????? ???? ? ?????? ?????????? (??????? ????????????). ? ?????? ???????????? ????????? ??????????????? ?????????? ? ?????. ?? ????? ??????? ???? ?????????? ? ?? ?????????? ??? ??????? 4 ??????? ????? ??????????? ??? ?????? ?????? ???? ??????? ??????. ???????? ??????? ???????? ????? ? ?????????????? ???????. ??? ????????? ?????  ????????? ????? ??????????????. ????? ???????? ??????? ????? ???? ??? ?????? ????? ?????????? ???????? ???????, ? ??????? ??? ??????? ??? ????? ?????? ???????? ????? ?????????? ???????????? ?????????: ????????????? ???????, ????? ??? ?????? ????, ???, ??????????, ????????? ????, ??? ??? ?????; ????? ?????????? ?????? ????? - ????? ????? ????????? ????? ????????? ?????????? ??????????, ??????????? ??? ??????????? ? ?????. ????????? ????? ????????????? ? ????????????. ??????? ??? ?????; ??????????? ????, ??????????? ???????, ????????? ??? ?????? ???????????? ? ????????? ????, ? ??? ????? ????????? ?? ???; ??????????? ???????? ? ???????? ????????, ????? ??? ????????? ???????????, ?????, ??????, ???? ? ?????, ??????????? ??? ???????????? ??????????????; ??????????? ???????? ? ???????? ???????? ?????????? ??????????? ??? ??????, ????? ??? ????????? ???????? ??? ?????? ???, ?????????????? ????????? ??? ???????, ???????, ????????? ???????. ????????? ???????????? ??? ??????; ???????? ???????, ??????? ?????? ?? ??????? ???????????? ???????????? ???????? (???????? ????? ??? ??????? ???????????? ?????????, ???? ??? ???????????? ????????????? ??????????): ????????? ???????? ???????? ????; ??????; ?????????; ?????? ????????; ???? ?? ???; ???? ???????? ?? ???????? ???? ????????? ???????? ????????. ?????????? ? ????? ?? ??????? ???????????? ???????? ????????. ?? ?????? ???????? ? ???????????????? ?  FDA ?? ???????? 1-(838) 844-0006. ??? ??????? ??????? ??? ?????????? ??? ????????? ???????? ?????? ??? ?????? ??????????? ?????????? ? ???????? ??????????. ??? ????????? ?? ???????? ??? ???????? ? ???????? ????????. ??????????: ???? ???????? ?? ???????? ???? ????????? ????????. ???? ? ??? ????????? ???????, ?????????? ??????? ?????????, ?????????? ? ?????, ????????????? ??????? ???????????? ?????????.  2022 Elsevier/Gold Standard (2020-03-06 00:00:00)  Cyclophosphamide Injection ??? ???????????? ????? ???  ?????????? ????????????? ??? ???????????????????? ????????. ?? ????????? ???? ??????????????? ??????. ??? ????????? ??????????? ??? ??????? ?????? ??????????????? ????????, ????? ??? ???????, ???????, ??????, ??? ?????????????? ? ????????. ??? ????????? ????? ??????????? ? ?????? ?????; ???? ? ??? ????????? ???????,?????????? ? ???????????? ????????? ??? ??????????. ???????????????? ????????? ???????? (????????): Cytoxan, Neosar ??? ??? ??????? ?????????? ???????????? ????????? ?? ?????? ?????? ??????????????? ?????????? ?????, ???? ?? ? ??? ???? ?? ????????? ?????????: ??????????? ??????; ??????? ? ???????; ????????; ??????????? ?????; ??????????? ??????; ???????? ?????????? ?????? ?????, ????? ??? ?????????, ?????????? ??? ??????????; ?????????? ???????????; ???????????? ? ???????? ??????? ??? ? ????????? ?????; ?????????? ??? ????????? ???????? ?????; ??????????? ??? ??????????????; ????????? ??? ????????????? ??????? ?? ?????????????; ????????? ??? ????????????? ??????? ?? ?????? ?????????; ????????? ??? ????????????? ??????? ?? ??????? ????????, ????????? ??? ???????????; ???????????? ??? ???????????? ????????????; ????????? ??????; ??? ??? ??????? ????????? ??? ?????????? ??? ????????? ????????????? ??? ??????????????? ????????, ? ????? ??? ????????????? ????????? ? ?????????? ????????. ?? ???????? ? ???????? ?????????? ?????????? ????????? ??????????? ??????????. ???????? ???????? ? ??????????? ?????????? ????? ????????? ?????. ? ???? ???????????????? ?????? ????????????. ?????????????: ???? ??? ???????, ??? ?? ????????? ?????????? ???? ????? ?????????, ?????????? ?????????? ? ????????????????? ????? ??? ???????? ????????? ??? ???????? ?????????? ??????. ??????????: ??? ?????????????????????? ?????? ??? ???. ?? ???????? ?? ? ??????? ??????. ??? ?????????? ? ?????? ???????? ?????? ?????????? ????? ????????? ????????? ?????????. ???????? ????? ??? ??????? ?????????????????????, ???? ??  ?? ?????? ??????? ?? ????? ? ??????????? ?????. ? ??? ??? ????????? ????? ???????? ?? ??????????????? ??????????? ?; ??????????; ????????? ??????????????? ????????? ??? ??????? ???-???????? ??? ????????; ????????? ????????????? ?????????, ????????? ??? ??????? ??????????? ?????? ??? ???????; ????????? ?????????, ????????????? ??? ??????? ??? ?????????????? ????????, ????? ??? ????????; ????????? ?????? ????????? ??? ??????? ??????????????? ????????; ???????????; ??????????; ???????????; ?????????, ????????????? ??????????? ??? ?????????? ????????; ?????????, ????????????? ?????????? ?????? ?????; ????????????; ???? ???????? ?? ???????? ???? ????????? ??????????????. ???????????? ???????????? ????????? ?????? ???? ??????????? ???? ????????, ????????????? ????????, ?????????????? ?????????? ? ??????? ???????. ????? ???????? ??? ? ???????, ???????????? ??????????? ???????? ??? ??????????. ????????? ????????????? ???????? ?? ?????????????? ? ??????????? ???? ??????????. ?? ??? ????? ???????? ???????? ??? ????????????? ????? ?????????? ?? ????? ?????? ????? ????????? ?? ?????? ??? ?????????? ??????????? ?????. ?? ????? ?????? ????? ????????? ??? ??????? ????????? ??????? ??????? ?????. ???????????? ???? ??? ?????? ???????? ? ???????????? ? ??????????. ?????????????, ???? ?????. ? ?????? ????????? ???????? ????? ??????? ?????. ???????? ? ????? ??? ??????? ???????????? ?????????, ???????? ?? ??? ????????? ?????. ??????????? ???????? ???? ??????????? ??????????, ??????????? ? ????? ???????, ? ???, ??? ?? ?????????? ??? ?????????. ????? ????????? ????????, ????? ??? ???????????? ? ???????????, ?????? ? ????????? ????? ???????? ? ????????????? ???????. ??? ??????? ??????????? ????????? ????, ???????? ?????, ????????, ??????, ??????????? ? ?????????? ??????. ??? ????? ???????????? ?? 30 ????? ???????????? ?????. ????????? ???????????? ?? ????? ?????? ????? ????????? ? ? ??????? 1 ???? ????? ??????????? ???  ??????. ???????? ??????? ???????? ???????????? ????????? ? ???????????? ???????????? ??? ????????????? ? ????????? ????????????. ?? ????? ?????? ????? ????????? ? ?? ?????????? 4 ??????? ????? ????????? ??? ?????????? ???????? ?? ??????? ???????? ???????. ?????????? ??????????? ???????? ????? ????????? ?? ????. ?????????? ? ?????? ????? ??? ??????? ???????????? ??????????? ????? ????????? ???????????. ?? ????? ?????????? ????? ????????? ? ? ???????  1 ?????? ????? ??? ????????????????? ??????? ??????? ??????. ? ????????? ?????? ??? ????????? ???????? ?????????? ???????????????. ? ?????? ??? ????????? ????? ???????? ??????????? ? ???????. ? ?????? ???????????? ???????????? ?????????????? ??????? ???????? ???? ?????? ? ?????? ??? ????????????????? ??????????. ? ????????? ?????? ??? ?????????? ????? ????????? ????????? ????? ??????????????. ??? ????????? ????? ???????? ??????????? ?????? ? ???????. ? ?????? ???????????? ???????????? ?????????????? ??????? ???????? ???? ?????? ??????? ??? ?????? ??????????? ??????????. ?????????? ?? ??????? ? ????? ??? ??????? ???????????? ????????? ? ?????? ????????? ???????????, ??????, ???? ? ????? ??? ?????? ????????? ???????? ??? ??????. ?? ??????????? ????????????. ??? ????????? ????? ??????? ??????????? ?????? ????????? ????????????? ?????????. ???????????? ???????? ???????? ???????????? ??????. ?? ?????????? ?????????, ?????????? ???????, ????????????, ?????????, ????????? ??? ??????????, ??? ???????????? ????? ??? ??????? ???????????? ?????????. ???????????? ????? ?????? ????????? ???????????. ???????? ? ?????? ??? ?????? ??????????? ?????????? ???? ???????? ? ??? ???????????????? ???????????????. ??? ?????? ????? ????????? ???????? ?????????????? ???????? ????????? ????? ??????????????? ???????????????. ???? ? ??? ????????????? ???????? ??? ?????? ?????????, ???????? ????? ?????????? ???????????? ????????? ?? ????????????? ????? ?????????. ?????? ????????? ??? ??????  ????? ?????? ?????? ??? ?????, ? ????? ??? ??????????? ????????????, ????????? ??? ???? ?????????? ??????????? ????????????? ??? ????????????. ??? ????????????? ?????????? ?????????????????????? ????????? ???????? ??????????? ? ?????? ????? ?????????. ????? ???????? ??????? ????? ???? ??? ?????? ????? ?????????? ???????? ???????, ? ??????? ??? ??????? ??? ????? ?????? ???????? ????? ?????????? ???????????? ?????????: ????????????? ???????, ????? ??? ?????? ????, ???, ??????????, ????????? ????, ??? ??? ?????; ??????????? ???????; ??????? ??? ?????; ???????? ? ???????? ????????????, ????? ??? ??????????? ??? ?????? ????????????? ???; ??????? ??? ?????-?????????? ???? ????; ????? ?????? ??? ?????? ??????????? ?????, ???????????? ???????? ????; ??????? ????? ?? ????; ????????? ?????? ??? ????????????? ? ?????, ???????????? ?? ????? ??? ????; ??????????? ???????? ? ???????? ????????? ???????????????, ????? ??? ??????? ??????????? ????????????, ????????? ?????????? ????? ????, ????????? ???????, ???????, ??????; ??????????? ???????? ? ???????? ????????, ????? ??? ????????? ???????????, ?????, ??????, ???? ? ?????, ??????????? ??? ???????????? ??????????????; ??????????? ???????? ? ???????? ??????????? ?????, ????? ??? ???????????? ?????????????? ??? ????????? ?????????? ????; ??????????? ???????? ??????????? ??????, ???????? ???? ?????-??????? ??? ??????????? ?????, ????? ??????????? ??? ?????????????? ????????, ?????????????? ????, ????????? ????????, ???????, ???? ? ?????? ??????????, ????????? ???????? ??? ?????? ???, ?????????? ?????? ???? ??? ????; ???????? ???????, ??????? ?????? ?? ??????? ???????????? ???????????? ???????? (???????? ????? ??? ??????? ???????????? ?????????, ???? ??? ???????????? ????????????? ??????????): ??????????? ????????; ????????? ?????; ??????; ??????? ????? ? ????; ?????????; ???????? ????; ?????? ????????; ???????? ???????????; ??????????? ???????? ? ???????? ????????  ?????????? ??????????? ??? ??????, ????? ??? ????????? ???????? ??? ?????????, ?????????????? ????????? ??? ???????, ???????; ????????? ????? ????. ???? ???????? ?? ???????? ???? ????????? ???????? ????????. ?????????? ? ????? ?? ??????? ???????????? ???????? ????????. ?? ?????? ???????? ? ???????????????? ? FDA ?? ???????? [phone]. ??? ??????? ??????? ??? ?????????? ??? ????????? ???????? ?????? ??? ?????? ??????????? ?????????? ? ???????? ??????????. ??? ????????? ?? ???????? ??? ???????? ? ???????? ????????. ??????????: ???? ???????? ?? ???????? ???? ????????? ????????. ???? ? ??? ????????? ???????, ?????????? ??????? ?????????, ?????????? ? ?????, ????????????? ??????? ???????????? ?????????.  2022 Elsevier/Gold Standard (2020-03-06 00:00:00)

## 2021-06-09 NOTE — Progress Notes (Addendum)
Decrease Etoposide IV to '50mg'$ /m2 IV per Dr. Lorenso Courier. Infuse Cytoxan over 1 hour for first infusion. No Rituximab today due to time constraints per RN.  Raul Del Bethel, [place], BCPS, BCOP 06/09/2021 11:33 AM

## 2021-06-10 ENCOUNTER — Ambulatory Visit: Payer: Medicare HMO

## 2021-06-10 ENCOUNTER — Inpatient Hospital Stay: Payer: Medicare HMO

## 2021-06-10 VITALS — BP 133/57 | HR 72 | Temp 97.5°F | Resp 16

## 2021-06-10 DIAGNOSIS — Z5112 Encounter for antineoplastic immunotherapy: Secondary | ICD-10-CM | POA: Diagnosis not present

## 2021-06-10 DIAGNOSIS — C8338 Diffuse large B-cell lymphoma, lymph nodes of multiple sites: Secondary | ICD-10-CM

## 2021-06-10 LAB — HEPATITIS B SURFACE ANTIBODY,QUALITATIVE: Hep B S Ab: REACTIVE — AB

## 2021-06-10 LAB — HEPATITIS B SURFACE ANTIGEN: Hepatitis B Surface Ag: NONREACTIVE

## 2021-06-10 LAB — HEPATITIS B CORE ANTIBODY, TOTAL: Hep B Core Total Ab: REACTIVE — AB

## 2021-06-10 MED ORDER — SODIUM CHLORIDE 0.9 % IV SOLN
375.0000 mg/m2 | Freq: Once | INTRAVENOUS | Status: AC
  Administered 2021-06-10: 900 mg via INTRAVENOUS
  Filled 2021-06-10: qty 50

## 2021-06-10 MED ORDER — ACETAMINOPHEN 325 MG PO TABS
650.0000 mg | ORAL_TABLET | Freq: Once | ORAL | Status: AC
  Administered 2021-06-10: 650 mg via ORAL

## 2021-06-10 MED ORDER — SODIUM CHLORIDE 0.9 % IV SOLN
50.0000 mg/m2 | Freq: Once | INTRAVENOUS | Status: AC
  Administered 2021-06-10: 120 mg via INTRAVENOUS
  Filled 2021-06-10: qty 6

## 2021-06-10 MED ORDER — SODIUM CHLORIDE 0.9% FLUSH
10.0000 mL | INTRAVENOUS | Status: DC | PRN
  Administered 2021-06-10: 10 mL
  Filled 2021-06-10: qty 10

## 2021-06-10 MED ORDER — PROCHLORPERAZINE MALEATE 10 MG PO TABS
ORAL_TABLET | ORAL | Status: AC
  Filled 2021-06-10: qty 1

## 2021-06-10 MED ORDER — HEPARIN SOD (PORK) LOCK FLUSH 100 UNIT/ML IV SOLN
250.0000 [IU] | Freq: Once | INTRAVENOUS | Status: DC | PRN
  Filled 2021-06-10: qty 5

## 2021-06-10 MED ORDER — CYANOCOBALAMIN 1000 MCG/ML IJ SOLN
INTRAMUSCULAR | Status: AC
  Filled 2021-06-10: qty 1

## 2021-06-10 MED ORDER — PROCHLORPERAZINE MALEATE 10 MG PO TABS
10.0000 mg | ORAL_TABLET | Freq: Once | ORAL | Status: AC
  Administered 2021-06-10: 10 mg via ORAL

## 2021-06-10 MED ORDER — DIPHENHYDRAMINE HCL 25 MG PO CAPS
50.0000 mg | ORAL_CAPSULE | Freq: Once | ORAL | Status: AC
  Administered 2021-06-10: 50 mg via ORAL

## 2021-06-10 MED ORDER — SODIUM CHLORIDE 0.9 % IV SOLN
Freq: Once | INTRAVENOUS | Status: AC
  Filled 2021-06-10: qty 250

## 2021-06-10 MED ORDER — DIPHENHYDRAMINE HCL 25 MG PO CAPS
ORAL_CAPSULE | ORAL | Status: AC
  Filled 2021-06-10: qty 2

## 2021-06-10 MED ORDER — ACETAMINOPHEN 325 MG PO TABS
ORAL_TABLET | ORAL | Status: AC
  Filled 2021-06-10: qty 2

## 2021-06-10 NOTE — Patient Instructions (Signed)
[facility] ONCOLOGY   Discharge Instructions: Thank you for choosing [facility] to provide your oncology and hematology care.   If you have a lab appointment with the [facility], please go directly to the [facility] and check in at the registration area.   Wear comfortable clothing and clothing appropriate for easy access to any Portacath or PICC line.   We strive to give you quality time with your provider. You may need to reschedule your appointment if you arrive late (15 or more minutes).  Arriving late affects you and other patients whose appointments are after yours.  Also, if you miss three or more appointments without notifying the office, you may be dismissed from the clinic at the provider's discretion.      For prescription refill requests, have your pharmacy contact our office and allow 72 hours for refills to be completed.    Today you received the following chemotherapy and/or immunotherapy agents: rituximab and etoposide.      To help prevent nausea and vomiting after your treatment, we encourage you to take your nausea medication as directed.  BELOW ARE SYMPTOMS THAT SHOULD BE REPORTED IMMEDIATELY: *FEVER GREATER THAN 100.4 F (38 C) OR HIGHER *CHILLS OR SWEATING *NAUSEA AND VOMITING THAT IS NOT CONTROLLED WITH YOUR NAUSEA MEDICATION *UNUSUAL SHORTNESS OF BREATH *UNUSUAL BRUISING OR BLEEDING *URINARY PROBLEMS (pain or burning when urinating, or frequent urination) *BOWEL PROBLEMS (unusual diarrhea, constipation, pain near the anus) TENDERNESS IN MOUTH AND THROAT WITH OR WITHOUT PRESENCE OF ULCERS (sore throat, sores in mouth, or a toothache) UNUSUAL RASH, SWELLING OR PAIN  UNUSUAL VAGINAL DISCHARGE OR ITCHING   Items with * indicate a potential emergency and should be followed up as soon as possible or go to the Emergency Department if any problems should occur.  Please show the CHEMOTHERAPY ALERT CARD or IMMUNOTHERAPY ALERT  CARD at check-in to the Emergency Department and triage nurse.  Should you have questions after your visit or need to cancel or reschedule your appointment, please contact [facility]  Dept: [phone]  and follow the prompts.  Office hours are 8:00 a.m. to 4:30 p.m. Monday - Friday. Please note that voicemails left after 4:00 p.m. may not be returned until the following business day.  We are closed weekends and major holidays. You have access to a nurse at all times for urgent questions. Please call the main number to the clinic Dept: [phone] and follow the prompts.   For any non-urgent questions, you may also contact your provider using MyChart. We now offer e-Visits for anyone 8 and older to request care online for non-urgent symptoms. For details visit [email or web address].   Also download the MyChart app! Go to the app store, search "MyChart", open the app, select [facility], and log in with your MyChart username and password.  Due to Covid, a mask is required upon entering the hospital/clinic. If you do not have a mask, one will be given to you upon arrival. For doctor visits, patients may have 1 support person aged 4 or older with them. For treatment visits, patients cannot have anyone with them due to current Covid guidelines and our immunocompromised population.   Rituximab Injection What is this medication? RITUXIMAB (ri TUX i mab) is a monoclonal antibody. It is used to treat certain types of cancer like non-Hodgkin lymphoma and chronic lymphocytic leukemia. It is also used to treat rheumatoid arthritis, granulomatosis with polyangiitis,microscopic polyangiitis, and pemphigus vulgaris.  This medicine may be used for other purposes; ask your health care provider orpharmacist if you have questions. COMMON BRAND NAME(S): RIABNI, Rituxan, RUXIENCE What should I tell my care team before I take this medication? They need to know if you have any of these  conditions: chest pain heart disease infection especially a viral infection such as chickenpox, cold sores, hepatitis B, or herpes immune system problems irregular heartbeat or rhythm kidney disease low blood counts (white cells, platelets, or red cells) lung disease recent or upcoming vaccine an unusual or allergic reaction to rituximab, other medicines, foods, dyes, or preservatives pregnant or trying to get pregnant breast-feeding How should I use this medication? This medicine is injected into a vein. It is given by a health care provider ina hospital or clinic setting. A special MedGuide will be given to you before each treatment. Be sure to readthis information carefully each time. Talk to your health care provider about the use of this medicine in children. While this drug may be prescribed for children as young as 6 months forselected conditions, precautions do apply. Overdosage: If you think you have taken too much of this medicine contact apoison control center or emergency room at once. NOTE: This medicine is only for you. Do not share this medicine with others. What if I miss a dose? Keep appointments for follow-up doses. It is important not to miss your dose.Call your health care provider if you are unable to keep an appointment. What may interact with this medication? Do not take this medicine with any of the following medicines: live vaccines This medicine may also interact with the following medicines: cisplatin This list may not describe all possible interactions. Give your health care provider a list of all the medicines, herbs, non-prescription drugs, or dietary supplements you use. Also tell them if you smoke, drink alcohol, or use illegaldrugs. Some items may interact with your medicine. What should I watch for while using this medication? Your condition will be monitored carefully while you are receiving thismedicine. You may need blood work done while you are taking  this medicine. This medicine can cause serious infusion reactions. To reduce the risk your health care provider may give you other medicines to take before receiving thisone. Be sure to follow the directions from your health care provider. This medicine may increase your risk of getting an infection. Call your health care provider for advice if you get a fever, chills, sore throat, or other symptoms of a cold or flu. Do not treat yourself. Try to avoid being aroundpeople who are sick. Call your health care provider if you are around anyone with measles,chickenpox, or if you develop sores or blisters that do not heal properly. Avoid taking medicines that contain aspirin, acetaminophen, ibuprofen, naproxen, or ketoprofen unless instructed by your health care provider. Thesemedicines may hide a fever. This medicine may cause serious skin reactions. They can happen weeks to months after starting the medicine. Contact your health care provider right away if you notice fevers or flu-like symptoms with a rash. The rash may be red or purple and then turn into blisters or peeling of the skin. Or, you might notice a red rash with swelling of the face, lips or lymph nodes in your neck or underyour arms. In some patients, this medicine may cause a serious brain infection that may cause death. If you have any problems seeing, thinking, speaking, walking, or standing, tell your healthcare professional right away. If you cannot Soil scientist, urgently  seek other source of medical care. Do not become pregnant while taking this medicine or for at least 12 months after stopping it. Women should inform their health care provider if they wish to become pregnant or think they might be pregnant. There is potential for serious harm to an unborn child. Talk to your health care provider for more information. Women should use a reliable form of birth control while taking this medicine and for 12 months after  stopping it. Do not breast-feed whiletaking this medicine or for at least 6 months after stopping it. What side effects may I notice from receiving this medication? Side effects that you should report to your health care provider as soon aspossible: allergic reactions (skin rash, itching or hives; swelling of the face, lips, or tongue) diarrhea edema (sudden weight gain; swelling of the ankles, feet, hands or other unusual swelling; trouble breathing) fast, irregular heartbeat heart attack (trouble breathing; pain or tightness in the chest, neck, back or arms; unusually weak or tired) infection (fever, chills, cough, sore throat, pain or trouble passing urine) kidney injury (trouble passing urine or change in the amount of urine) liver injury (dark yellow or brown urine; general ill feeling or flu-like symptoms; loss of appetite, right upper belly pain; unusually weak or tired, yellowing of the eyes or skin) low blood pressure (dizziness; feeling faint or lightheaded, falls; unusually weak or tired) low red blood cell counts (trouble breathing; feeling faint; lightheaded, falls; unusually weak or tired) mouth sores redness, blistering, peeling, or loosening of the skin, including inside the mouth stomach pain unusual bruising or bleeding wheezing (trouble breathing with loud or whistling sounds) vomiting Side effects that usually do not require medical attention (report to yourhealth care provider if they continue or are bothersome): headache joint pain muscle cramps, pain nausea This list may not describe all possible side effects. Call your doctor for medical advice about side effects. You may report side effects to FDA at1-800-FDA-1088. Where should I keep my medication? This medicine is given in a hospital or clinic. It will not be stored at home. NOTE: This sheet is a summary. It may not cover all possible information. If you have questions about this medicine, talk to your doctor,  pharmacist, orhealth care provider.  2022 Elsevier/Gold Standard (2020-10-23 15:47:26)

## 2021-06-10 NOTE — Progress Notes (Signed)
Per Dr. Lorenso Courier, okay to proceed with rituxan with pending Hep B labs.

## 2021-06-11 ENCOUNTER — Other Ambulatory Visit: Payer: Self-pay | Admitting: Hematology and Oncology

## 2021-06-11 ENCOUNTER — Other Ambulatory Visit: Payer: Self-pay

## 2021-06-11 ENCOUNTER — Inpatient Hospital Stay: Payer: Medicare HMO

## 2021-06-11 VITALS — BP 136/62 | HR 71 | Temp 97.8°F | Resp 20

## 2021-06-11 DIAGNOSIS — C8338 Diffuse large B-cell lymphoma, lymph nodes of multiple sites: Secondary | ICD-10-CM

## 2021-06-11 DIAGNOSIS — Z5112 Encounter for antineoplastic immunotherapy: Secondary | ICD-10-CM | POA: Diagnosis not present

## 2021-06-11 MED ORDER — PROCHLORPERAZINE MALEATE 10 MG PO TABS
ORAL_TABLET | ORAL | Status: AC
  Filled 2021-06-11: qty 1

## 2021-06-11 MED ORDER — PROCHLORPERAZINE MALEATE 10 MG PO TABS
10.0000 mg | ORAL_TABLET | Freq: Once | ORAL | Status: AC
  Administered 2021-06-11: 10 mg via ORAL

## 2021-06-11 MED ORDER — SODIUM CHLORIDE 0.9 % IV SOLN
Freq: Once | INTRAVENOUS | Status: AC
  Filled 2021-06-11: qty 250

## 2021-06-11 MED ORDER — HEPARIN SOD (PORK) LOCK FLUSH 100 UNIT/ML IV SOLN
250.0000 [IU] | Freq: Once | INTRAVENOUS | Status: DC | PRN
  Filled 2021-06-11: qty 5

## 2021-06-11 MED ORDER — SODIUM CHLORIDE 0.9% FLUSH
3.0000 mL | INTRAVENOUS | Status: DC | PRN
  Administered 2021-06-11: 3 mL
  Filled 2021-06-11: qty 10

## 2021-06-11 MED ORDER — SODIUM CHLORIDE 0.9 % IV SOLN
50.0000 mg/m2 | Freq: Once | INTRAVENOUS | Status: AC
  Administered 2021-06-11: 120 mg via INTRAVENOUS
  Filled 2021-06-11: qty 6

## 2021-06-11 NOTE — Patient Instructions (Signed)
[facility] ONCOLOGY  Discharge Instructions: Thank you for choosing [facility] to provide your oncology and hematology care.   If you have a lab appointment with the [facility], please go directly to the [facility] and check in at the registration area.   Wear comfortable clothing and clothing appropriate for easy access to any Portacath or PICC line.   We strive to give you quality time with your provider. You may need to reschedule your appointment if you arrive late (15 or more minutes).  Arriving late affects you and other patients whose appointments are after yours.  Also, if you miss three or more appointments without notifying the office, you may be dismissed from the clinic at the provider's discretion.      For prescription refill requests, have your pharmacy contact our office and allow 72 hours for refills to be completed.    Today you received the following chemotherapy and/or immunotherapy agents Etoposide      To help prevent nausea and vomiting after your treatment, we encourage you to take your nausea medication as directed.  BELOW ARE SYMPTOMS THAT SHOULD BE REPORTED IMMEDIATELY: *FEVER GREATER THAN 100.4 F (38 C) OR HIGHER *CHILLS OR SWEATING *NAUSEA AND VOMITING THAT IS NOT CONTROLLED WITH YOUR NAUSEA MEDICATION *UNUSUAL SHORTNESS OF BREATH *UNUSUAL BRUISING OR BLEEDING *URINARY PROBLEMS (pain or burning when urinating, or frequent urination) *BOWEL PROBLEMS (unusual diarrhea, constipation, pain near the anus) TENDERNESS IN MOUTH AND THROAT WITH OR WITHOUT PRESENCE OF ULCERS (sore throat, sores in mouth, or a toothache) UNUSUAL RASH, SWELLING OR PAIN  UNUSUAL VAGINAL DISCHARGE OR ITCHING   Items with * indicate a potential emergency and should be followed up as soon as possible or go to the Emergency Department if any problems should occur.  Please show the CHEMOTHERAPY ALERT CARD or IMMUNOTHERAPY ALERT CARD at check-in to  the Emergency Department and triage nurse.  Should you have questions after your visit or need to cancel or reschedule your appointment, please contact [facility]  Dept: [phone]  and follow the prompts.  Office hours are 8:00 a.m. to 4:30 p.m. Monday - Friday. Please note that voicemails left after 4:00 p.m. may not be returned until the following business day.  We are closed weekends and major holidays. You have access to a nurse at all times for urgent questions. Please call the main number to the clinic Dept: [phone] and follow the prompts.   For any non-urgent questions, you may also contact your provider using MyChart. We now offer e-Visits for anyone 39 and older to request care online for non-urgent symptoms. For details visit [email or web address].   Also download the MyChart app! Go to the app store, search "MyChart", open the app, select [facility], and log in with your MyChart username and password.  Due to Covid, a mask is required upon entering the hospital/clinic. If you do not have a mask, one will be given to you upon arrival. For doctor visits, patients may have 1 support person aged 52 or older with them. For treatment visits, patients cannot have anyone with them due to current Covid guidelines and our immunocompromised population.   Etoposide, VP-16 injection ??? ???????????? ????? ??? ?????????? ????????, VP-16, ??? ???????????????????? ????????. ?? ??????????? ??? ??????????? ?????, ???? ?????? ? ?????? ??????????????? ????????. ??? ????????? ????? ??????????? ? ?????? ?????; ???? ? ??? ????????? ???????,?????????? ? ???????????? ????????? ??? ??????????. ???????????????? ????????? ???????? (????????): Etopophos, Toposar, VePesid ??? ??? ??????? ?????????? ???????????? ????????? ?? ?????? ?????? ??????????????? ?????????? ?????, ???? ?? ? ??? ???? ?? ????????? ?????????: ????????; ??????????? ?????; ??????????? ??????; ?????  ?????????? ?????? ?????, ????? ??? ??????????, ??????????? ??? ???????????; ????????? ??? ????????????? ??????? ?? ????????; ????????? ??? ????????????? ??????? ?? ?????? ?????????; ????????? ??? ????????????? ??????? ?? ??????? ????????, ????????? ??? ???????????; ???????????? ??? ???????????? ????????????; ????????? ??????; ??? ??? ??????? ????????? ??? ?????????? ??? ????????? ????????????? ??? ????????????? ????????. ?? ???????? ? ??????????? ??????? ?????????? ????????? ??????????? ??????????. ???????? ???????? ? ??????????? ?????????? ????? ????????? ?????. ? ???? ???????????????? ?????? ????????????. ?????????????: ???? ??? ???????, ??? ?? ????????? ?????????? ???? ????? ?????????, ?????????? ?????????? ? ????????????????? ????? ??? ???????? ????????? ??? ???????? ?????????? ??????. ??????????: ??? ?????????????????????? ?????? ??? ???. ?? ???????? ?? ? ??????? ??????. ??? ?????????? ? ?????? ???????? ?????? ?????????? ????? ????????? ????????? ?????????. ???????? ????? ??? ??????? ?????????????????????, ???? ?? ?? ?????? ??????? ?? ????? ? ??????????? ?????. ? ??? ??? ????????? ????? ???????? ?? ??????????????? ??? ????????? ????? ????????????????? ?? ?????????? ???????????: ????????; ???? ???????? ?? ???????? ???? ????????? ??????????????. ???????????? ???????????? ????????? ?????? ???? ??????????? ???? ????????, ????????????? ????????, ?????????????? ?????????? ? ??????? ???????. ????? ???????? ??? ? ???????, ???????????? ??????????? ???????? ??? ??????????. ????????? ????????????? ???????? ?? ?????????????? ? ??????????? ???? ??????????. ?? ??? ????? ???????? ???????? ??? ????????????? ????? ?????????? ????????? ????????? ????? ??? ??????? ???????????? ????????? ??? ?????????? ?? ????? ??????????. ??? ????????? ????? ??????? ????????? ?????? ????????????. ??? ??????????? ???????, ????????? ??? ???????????? ?????? ?? ???????????????? ????? ?????????? ? ???????? ??????. ????????? ? ?????  ???????? ????????. ??????????? ???? ??????? ???? ? ?????? ??????? ????????????, ???? ???? ?????????????? ????. ? ????????? ??????? ??????????? ?????????????? ????????? ??? ?????? ? ??????????????????. ???????? ????????? ?? ?? ??????????. ?????????? ?? ??????? ? ????? ??? ??????? ???????????? ????????? ? ?????? ????????? ???????????, ??????, ???? ? ????? ??? ?????? ????????? ???????? ??? ??????. ?? ??????????? ????????????. ??? ????????? ????? ??????? ??????????? ?????? ????????? ????????????? ?????????. ???????????? ???????? ???????? ???????????? ??????. ??? ????????? ???????????? ??????????? ????????????? ??? ????????? ??????????????. ? ?????? ?????????? ???????????? ?????????? ? ????? ??? ??????????????????? ?????????. ???????? ? ?????? ???? ???????? ? ??? ???????????????? ???????????????. ??? ?????? ????? ????????? ???????? ????????? ????? ????????? ????? ??????????????????????????????. ????????? ???????????? ?? ????? ?????? ????? ????????? ? ? ??????? ??? ???????  6 ??????? ????? ??? ???????????. ???????? ??????? ???????? ????? ? ???????????? ???????????? ??? ????????????? ? ????????? ????????????. ????? ??????? ?????? ????? ????????? ? ????????? ? ???????????? ?????? ????????? ????? ?? ???????????? ?????? ???? ?????????????. ?????????? ??????????? ???????? ????? ????????? ?? ????. ?? ?????????????? ??????????? ?????????? ? ?????, ??????? ???????????? ????????? ??? ??????????. ??? ?????? ????? ????????? ??????????????? ????????? ??????? ??????. ???????, ??????????? ??? ?????????, ??? ??????? ????????? ? ????????? ?????? ???????????? ????????? ???????????? ?? ?????????? ????? ??????? ??????? ? ? ??????? ??? ??????? 4 ??????? ????? ??? ?????????. ????????? ??????????? ????????? ???? ? ?????? ?????????? (??????? ????????????). ? ?????? ???????????? ????????? ??????????????? ?????????? ? ?????. ?? ????? ??????? ???? ?????????? ? ?? ?????????? ??? ??????? 4 ??????? ????? ??????????? ??? ?????? ?????? ????  ??????? ??????. ???????? ??????? ???????? ????? ? ?????????????? ???????. ??? ????????? ????? ????????? ????? ??????????????. ????? ???????? ??????? ????? ???? ??? ?????? ????? ?????????? ???????? ???????, ? ??????? ??? ??????? ??? ????? ?????? ???????? ????? ?????????? ???????????? ?????????: ????????????? ???????, ????? ??? ?????? ????, ???, ??????????, ????????? ????, ??? ??? ?????; ????? ?????????? ?????? ????? - ????? ????? ????????? ????? ????????? ?????????? ??????????, ??????????? ??? ??????????? ? ?????. ????????? ????? ????????????? ? ????????????. ??????? ??? ?????; ??????????? ????, ??????????? ???????, ????????? ??? ?????? ???????????? ? ????????? ????, ? ??? ????? ????????? ?? ???; ??????????? ???????? ? ???????? ????????, ????? ??? ????????? ???????????, ?????, ??????, ???? ? ?????, ??????????? ??? ???????????? ??????????????; ??????????? ???????? ? ???????? ???????? ?????????? ??????????? ??? ??????, ????? ??? ????????? ???????? ??? ?????? ???, ?????????????? ????????? ??? ???????, ???????, ????????? ???????. ????????? ???????????? ??? ??????; ???????? ???????, ??????? ?????? ?? ??????? ???????????? ???????????? ???????? (???????? ????? ??? ??????? ???????????? ?????????, ???? ??? ???????????? ????????????? ??????????): ????????? ???????? ???????? ????; ??????; ?????????; ?????? ????????; ???? ?? ???; ???? ???????? ?? ???????? ???? ????????? ???????? ????????. ?????????? ? ????? ?? ??????? ???????????? ???????? ????????. ?? ?????? ???????? ? ???????????????? ? FDA ?? ???????? [phone]. ??? ??????? ??????? ??? ?????????? ??? ????????? ???????? ?????? ??? ?????? ??????????? ?????????? ? ???????? ??????????. ??? ????????? ?? ???????? ??? ???????? ? ???????? ????????. ??????????: ???? ???????? ?? ???????? ???? ????????? ????????. ???? ? ??? ????????? ???????, ?????????? ??????? ?????????, ?????????? ? ?????, ????????????? ??????? ???????????? ?????????.  2022 Elsevier/Gold  Standard (2020-03-06 00:00:00)

## 2021-06-15 ENCOUNTER — Telehealth: Payer: Self-pay | Admitting: Hematology and Oncology

## 2021-06-15 NOTE — Telephone Encounter (Signed)
Scheduled appts per 8/1 sch msg. Called pt, no answer. Using interpreter services I left a msg with appts dates and times.

## 2021-06-17 ENCOUNTER — Other Ambulatory Visit: Payer: Self-pay

## 2021-06-17 ENCOUNTER — Inpatient Hospital Stay: Payer: Medicare HMO | Attending: Hematology and Oncology

## 2021-06-17 DIAGNOSIS — E1122 Type 2 diabetes mellitus with diabetic chronic kidney disease: Secondary | ICD-10-CM | POA: Insufficient documentation

## 2021-06-17 DIAGNOSIS — I4891 Unspecified atrial fibrillation: Secondary | ICD-10-CM | POA: Diagnosis not present

## 2021-06-17 DIAGNOSIS — I129 Hypertensive chronic kidney disease with stage 1 through stage 4 chronic kidney disease, or unspecified chronic kidney disease: Secondary | ICD-10-CM | POA: Diagnosis not present

## 2021-06-17 DIAGNOSIS — Z7901 Long term (current) use of anticoagulants: Secondary | ICD-10-CM | POA: Insufficient documentation

## 2021-06-17 DIAGNOSIS — Z5112 Encounter for antineoplastic immunotherapy: Secondary | ICD-10-CM | POA: Insufficient documentation

## 2021-06-17 DIAGNOSIS — R319 Hematuria, unspecified: Secondary | ICD-10-CM | POA: Insufficient documentation

## 2021-06-17 DIAGNOSIS — G4733 Obstructive sleep apnea (adult) (pediatric): Secondary | ICD-10-CM | POA: Insufficient documentation

## 2021-06-17 DIAGNOSIS — R600 Localized edema: Secondary | ICD-10-CM | POA: Insufficient documentation

## 2021-06-17 DIAGNOSIS — Z5111 Encounter for antineoplastic chemotherapy: Secondary | ICD-10-CM | POA: Insufficient documentation

## 2021-06-17 DIAGNOSIS — C8333 Diffuse large B-cell lymphoma, intra-abdominal lymph nodes: Secondary | ICD-10-CM | POA: Diagnosis present

## 2021-06-17 DIAGNOSIS — Z5189 Encounter for other specified aftercare: Secondary | ICD-10-CM | POA: Diagnosis not present

## 2021-06-17 LAB — CMP (CANCER CENTER ONLY)
ALT: 16 U/L (ref 0–44)
AST: 11 U/L — ABNORMAL LOW (ref 15–41)
Albumin: 3.1 g/dL — ABNORMAL LOW (ref 3.5–5.0)
Alkaline Phosphatase: 67 U/L (ref 38–126)
Anion gap: 9 (ref 5–15)
BUN: 24 mg/dL — ABNORMAL HIGH (ref 8–23)
CO2: 23 mmol/L (ref 22–32)
Calcium: 8.7 mg/dL — ABNORMAL LOW (ref 8.9–10.3)
Chloride: 107 mmol/L (ref 98–111)
Creatinine: 1.85 mg/dL — ABNORMAL HIGH (ref 0.61–1.24)
GFR, Estimated: 38 mL/min — ABNORMAL LOW (ref >60)
Glucose, Bld: 291 mg/dL — ABNORMAL HIGH (ref 70–99)
Potassium: 3.9 mmol/L (ref 3.5–5.1)
Sodium: 139 mmol/L (ref 135–145)
Total Bilirubin: 0.4 mg/dL (ref 0.3–1.2)
Total Protein: 5.2 g/dL — ABNORMAL LOW (ref 6.5–8.1)

## 2021-06-17 LAB — CBC WITH DIFFERENTIAL (CANCER CENTER ONLY)
Abs Immature Granulocytes: 0.05 10*3/uL (ref 0.00–0.07)
Basophils Absolute: 0 10*3/uL (ref 0.0–0.1)
Basophils Relative: 1 %
Eosinophils Absolute: 0.2 10*3/uL (ref 0.0–0.5)
Eosinophils Relative: 6 %
HCT: 25.8 % — ABNORMAL LOW (ref 39.0–52.0)
Hemoglobin: 8.6 g/dL — ABNORMAL LOW (ref 13.0–17.0)
Immature Granulocytes: 2 %
Lymphocytes Relative: 16 %
Lymphs Abs: 0.5 10*3/uL — ABNORMAL LOW (ref 0.7–4.0)
MCH: 26.8 pg (ref 26.0–34.0)
MCHC: 33.3 g/dL (ref 30.0–36.0)
MCV: 80.4 fL (ref 80.0–100.0)
Monocytes Absolute: 0.1 10*3/uL (ref 0.1–1.0)
Monocytes Relative: 4 %
Neutro Abs: 2.3 10*3/uL (ref 1.7–7.7)
Neutrophils Relative %: 71 %
Platelet Count: 192 10*3/uL (ref 150–400)
RBC: 3.21 MIL/uL — ABNORMAL LOW (ref 4.22–5.81)
RDW: 15.8 % — ABNORMAL HIGH (ref 11.5–15.5)
WBC Count: 3.2 10*3/uL — ABNORMAL LOW (ref 4.0–10.5)
nRBC: 0 % (ref 0.0–0.2)

## 2021-06-17 LAB — URIC ACID: Uric Acid, Serum: 2.5 mg/dL — ABNORMAL LOW (ref 3.7–8.6)

## 2021-06-17 LAB — LACTATE DEHYDROGENASE: LDH: 156 U/L (ref 98–192)

## 2021-06-18 ENCOUNTER — Ambulatory Visit (INDEPENDENT_AMBULATORY_CARE_PROVIDER_SITE_OTHER): Payer: Medicare HMO | Admitting: Infectious Disease

## 2021-06-18 ENCOUNTER — Other Ambulatory Visit: Payer: Self-pay

## 2021-06-18 ENCOUNTER — Encounter: Payer: Self-pay | Admitting: Infectious Disease

## 2021-06-18 VITALS — BP 139/69 | HR 76 | Wt 256.0 lb

## 2021-06-18 DIAGNOSIS — R338 Other retention of urine: Secondary | ICD-10-CM

## 2021-06-18 DIAGNOSIS — I358 Other nonrheumatic aortic valve disorders: Secondary | ICD-10-CM

## 2021-06-18 DIAGNOSIS — R233 Spontaneous ecchymoses: Secondary | ICD-10-CM

## 2021-06-18 DIAGNOSIS — R7881 Bacteremia: Secondary | ICD-10-CM | POA: Diagnosis not present

## 2021-06-18 DIAGNOSIS — N401 Enlarged prostate with lower urinary tract symptoms: Secondary | ICD-10-CM

## 2021-06-18 DIAGNOSIS — I480 Paroxysmal atrial fibrillation: Secondary | ICD-10-CM | POA: Diagnosis not present

## 2021-06-18 DIAGNOSIS — R238 Other skin changes: Secondary | ICD-10-CM | POA: Insufficient documentation

## 2021-06-18 DIAGNOSIS — R319 Hematuria, unspecified: Secondary | ICD-10-CM

## 2021-06-18 DIAGNOSIS — N390 Urinary tract infection, site not specified: Secondary | ICD-10-CM

## 2021-06-18 DIAGNOSIS — R310 Gross hematuria: Secondary | ICD-10-CM

## 2021-06-18 DIAGNOSIS — C8338 Diffuse large B-cell lymphoma, lymph nodes of multiple sites: Secondary | ICD-10-CM

## 2021-06-18 HISTORY — DX: Spontaneous ecchymoses: R23.3

## 2021-06-18 HISTORY — DX: Hematuria, unspecified: R31.9

## 2021-06-18 HISTORY — DX: Other skin changes: R23.8

## 2021-06-18 NOTE — Progress Notes (Signed)
Subjective:   Chief complaint is significant hematuria   Patient ID: [name], male    DOB: 02-05-1946, 75 y.o.   MRN: [id number]  HPI  75 year old 68 man with a history of diffuse large cell lymphoma I met when he was in the hospital with a pseudomonal and Enterococcus UTI in setting of ureteral stents placed due to obstructive pathology from his tumors.  His urinary tract infection was in fact complicated by Pseudomonas aeruginosa bacteremia.  He had work-up with a transthoracic echocardiogram and a transesophageal echocardiogram which showed a lesion on the aortic valve.  Cardiology thought it was more likely a calcification but felt it could also be a vegetation.  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  He is also been seen by Dr. Lorenso Courier and initiated on chemotherapy.  He is complaining of profound fatigue and also heavy bleeding into his urine.  He also having easy bruising.    Past Medical History:  Diagnosis Date   Anginal pain ([place])    Coronary artery disease    Diabetes mellitus without complication ([place])    Dysrhythmia    Hypertension    Sleep apnea     Past Surgical History:  Procedure Laterality Date   CARDIAC CATHETERIZATION     CYSTOSCOPY WITH RETROGRADE PYELOGRAM, URETEROSCOPY AND STENT PLACEMENT Right 05/01/2021   Procedure: CYSTOSCOPY WITH RIGHT  RETROGRADE PYELOGRAM,  AND RIGHT STENT PLACEMENT;  Surgeon: Festus Aloe, MD;  Location: [facility];  Service: Urology;  Laterality: Right;   EYE SURGERY     INJECTION OF SILICONE OIL Left 23/55/7322   Procedure: INJECTION OF SILICONE OIL;  Surgeon: Sherlynn Stalls, MD;  Location: [place];  Service: Ophthalmology;  Laterality: Left;   INJECTION OF SILICONE OIL Left 0/25/4270   Procedure: INJECTION OF SILICONE OIL;  Surgeon: Sherlynn Stalls, MD;  Location: [place];  Service: Ophthalmology;  Laterality: Left;   JOINT REPLACEMENT Right    hip   LASER PHOTO ABLATION Left 12/25/2020   Procedure: LASER PHOTO ABLATION;  Surgeon: Sherlynn Stalls, MD;  Location: [place];  Service: Ophthalmology;  Laterality: Left;   PARS PLANA VITRECTOMY Left 11/10/2020   Procedure: PARS PLANA VITRECTOMY WITH 25 GAUGE;  Surgeon: Sherlynn Stalls, MD;  Location: [place];  Service: Ophthalmology;  Laterality: Left;   PARS PLANA VITRECTOMY Left 12/25/2020   Procedure: PARS PLANA VITRECTOMY WITH 25 GAUGE IN LEFT EYE;  Surgeon: Sherlynn Stalls, MD;  Location: [place];  Service: Ophthalmology;  Laterality: Left;   PERFLUORONE INJECTION Left 12/25/2020   Procedure: PERFLUORONE INJECTION;  Surgeon: Sherlynn Stalls, MD;  Location: [place];  Service: Ophthalmology;  Laterality: Left;   PHOTOCOAGULATION WITH LASER Left 11/10/2020   Procedure: PHOTOCOAGULATION WITH LASER;  Surgeon: Sherlynn Stalls, MD;  Location: [place];  Service: Ophthalmology;  Laterality: Left;   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   Procedure: RETINECTOMY LEFT EYE;  Surgeon: Sherlynn Stalls, MD;  Location: [place];  Service: Ophthalmology;  Laterality: Left;   SILICON OIL REMOVAL Left 05/08/7627   Procedure: SILICONE OIL REMOVAL;  Surgeon: Sherlynn Stalls, MD;  Location: [place];  Service: Ophthalmology;  Laterality: Left;   TEE WITHOUT CARDIOVERSION N/A 05/29/2021   Procedure: TRANSESOPHAGEAL ECHOCARDIOGRAM (TEE);  Surgeon: Werner Lean, MD;  [facility];  Service: Cardiovascular;  Laterality: N/A;    Family History  Problem Relation Age of Onset   CAD Other       Social History   Socioeconomic History   Marital status: Married    Spouse name: Not on file   Number of children: Not on file   Years of education: Not on file   Highest education level: Not on file  Occupational History   Not on file  Tobacco Use   Smoking status: Never   Smokeless tobacco: Never  Vaping Use    Vaping Use: Never used  Substance and Sexual Activity   Alcohol use: Not Currently   Drug use: Never   Sexual activity: Yes  Other Topics Concern   Not on file  Social History Narrative   Not on file   Social Determinants of Health   Financial Resource Strain: Not on file  Food Insecurity: Not on file  Transportation Needs: Not on file  Physical Activity: Not on file  Stress: Not on file  Social Connections: Not on file    No Known Allergies   Current Outpatient Medications:    piperacillin-tazobactam (ZOSYN) IVPB, Inject 13.5 g into the vein daily. As a continuous infusion Indication:  pseudomonas bacteremia with possible endocarditis First Dose: Yes Last Day of Therapy:  07/08/21 Labs - Once weekly:  CBC/D and BMP, Labs - Every other week:  ESR and CRP Method of administration: Elastomeric (Continuous infusion) Method of administration may be changed at the discretion of home infusion pharmacist based upon assessment of the patient and/or caregiver's ability to self-administer the medication ordered., Disp: 40 Units, Rfl: 0   acetaminophen (TYLENOL) 650 MG CR tablet, Take 650 mg by mouth every 8 (eight) hours as needed for pain or fever (headache)., Disp: , Rfl:    allopurinol (ZYLOPRIM) 300 MG tablet, Take 1 tablet (300 mg total) by mouth daily., Disp: 90 tablet, Rfl: 1   amiodarone (PACERONE) 200 MG tablet, Take 200 mg by mouth every morning., Disp: , Rfl:    amLODipine (NORVASC) 10 MG tablet, Take 1 tablet (10 mg total) by mouth daily., Disp: 30 tablet, Rfl: 1   apixaban (ELIQUIS) 5 MG TABS tablet, Take 5 mg by mouth 2 (two) times daily., Disp: , Rfl:    atorvastatin (LIPITOR) 20 MG tablet, Take 20 mg by mouth at bedtime., Disp: , Rfl:    clopidogrel (PLAVIX) 75 MG tablet, Take 75 mg by mouth every morning., Disp: , Rfl:    finasteride (PROSCAR) 5 MG tablet, Take 5 mg by mouth at bedtime., Disp: , Rfl:    glipiZIDE (GLUCOTROL) 10 MG tablet, Take 10 mg by mouth 2 (two) times  daily., Disp: , Rfl:    insulin glargine (LANTUS) 100 unit/mL SOPN, Inject 34 Units into the skin at bedtime., Disp: , Rfl:    isosorbide mononitrate (IMDUR) 30 MG 24 hr tablet, Take 30 mg by mouth every morning., Disp: , Rfl:    metFORMIN (GLUCOPHAGE) 1000 MG tablet, Take 1,000 mg by mouth 2 (two) times daily., Disp: , Rfl:    metoprolol tartrate (LOPRESSOR) 50 MG tablet, Take 1 tablet (50 mg total) by mouth 2 (two) times daily., Disp: 60 tablet, Rfl: 1   nitroGLYCERIN (NITROSTAT) 0.3 MG SL tablet, Place 0.3 mg under the tongue every 5 (five) minutes x 3 doses as needed for chest pain., Disp: , Rfl:    predniSONE (DELTASONE) 20 MG tablet, Take 3 tablets (60 mg total) by mouth daily with breakfast. Take 3 pills per day starting on Day 1 of chemotherapy, for  5 total days., Disp: 15 tablet, Rfl: 5   prochlorperazine (COMPAZINE) 10 MG tablet, Take 1 tablet (10 mg total) by mouth every 6 (six) hours as needed for nausea or vomiting., Disp: 30 tablet, Rfl: 0 No current facility-administered medications for this visit.  Facility-Administered Medications Ordered in Other Visits:    heparin lock flush 100 unit/mL, 250 Units, Intracatheter, PRN, Narda Rutherford T IV, MD   sodium chloride flush (NS) 0.9 % injection 10 mL, 10 mL, Intracatheter, PRN, Orson Slick, MD   Review of Systems  Constitutional:  Positive for fatigue. Negative for chills and fever.  HENT:  Negative for congestion and sore throat.   Eyes:  Negative for photophobia, discharge and redness.  Respiratory:  Negative for cough, shortness of breath and wheezing.   Cardiovascular:  Negative for chest pain, palpitations and leg swelling.  Gastrointestinal:  Negative for abdominal pain, blood in stool, constipation, diarrhea, nausea and vomiting.  Genitourinary:  Positive for hematuria. Negative for dysuria and flank pain.  Musculoskeletal:  Negative for back pain and myalgias.  Skin:  Negative for rash.  Neurological:  Negative for  dizziness, weakness and headaches.  Hematological:  Does not bruise/bleed easily.  Psychiatric/Behavioral:  Negative for agitation, dysphoric mood and suicidal ideas.       Objective:   Physical Exam Constitutional:      General: He is not in acute distress.    Appearance: Normal appearance. He is well-developed. He is not ill-appearing or diaphoretic.  HENT:     Head: Normocephalic and atraumatic.     Right Ear: Hearing and external ear normal.     Left Ear: Hearing and external ear normal.     Nose: No nasal deformity or rhinorrhea.  Eyes:     General: No scleral icterus.    Extraocular Movements: Extraocular movements intact.     Conjunctiva/sclera: Conjunctivae normal.     Right eye: Right conjunctiva is not injected.     Left eye: Left conjunctiva is not injected.     Pupils: Pupils are equal, round, and reactive to light.  Neck:     Vascular: No JVD.  Cardiovascular:     Rate and Rhythm: Normal rate and regular rhythm.     Heart sounds: Normal heart sounds, S1 normal and S2 normal. No murmur heard.   No friction rub. No gallop.  Pulmonary:     Effort: Pulmonary effort is normal. No respiratory distress.     Breath sounds: Normal breath sounds. No stridor. No wheezing, rhonchi or rales.  Chest:     Chest wall: No tenderness.  Abdominal:     General: Bowel sounds are normal. There is no distension.     Palpations: Abdomen is soft. There is no mass.     Tenderness: There is no abdominal tenderness. There is no rebound.     Hernia: No hernia is present.  Musculoskeletal:        General: Normal range of motion.     Right shoulder: Normal.     Left shoulder: Normal.     Cervical back: Normal range of motion and neck supple.     Right hip: Normal.     Left hip: Normal.     Right knee: Normal.     Left knee: Normal.  Lymphadenopathy:     Head:     Right side of head: No submandibular, preauricular or posterior auricular adenopathy.     Left side of head: No  submandibular, preauricular or posterior  auricular adenopathy.     Cervical: No cervical adenopathy.     Right cervical: No superficial or deep cervical adenopathy.    Left cervical: No superficial or deep cervical adenopathy.  Skin:    General: Skin is warm and dry.     Coloration: Skin is not pale.     Findings: No abrasion, bruising, ecchymosis, erythema, lesion or rash.     Nails: There is no clubbing.  Neurological:     General: No focal deficit present.     Mental Status: He is alert and oriented to person, place, and time.     Sensory: No sensory deficit.     Coordination: Coordination normal.     Gait: Gait normal.  Psychiatric:        Attention and Perception: He is attentive.        Mood and Affect: Mood normal.        Speech: Speech normal.        Behavior: Behavior normal. Behavior is cooperative.        Thought Content: Thought content normal.        Judgment: Judgment normal.     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 Libby Maw note and I avoided mentioning the term malignancy to him but simply reference the fact that he was receiving treatment from Dr. Lorenso Courier.  Heavy hematuria: I expect this is due to a combination of tumors in the bladder anticoagulation potentially response to chemotherapy.  He is very concerned about risk from his heavy bleeding into his urine  I counseled him with regards to warning signs of severe anemia including dyspnea chest pain dizziness lightheadedness profound fatigue his most recent hemoglobin from yesterday was stable and I pointed out that he would have another CBC checked next week.  He has follow-up with urology on 10 August.  Atrial fibrillation on  eliquis  I spent 42  minutes with the patient including face to face counseling of the patient regarding complicated UTI's his specific pathogens the reasons why we are erring on the side of treating for endocarditis, counseled him with regards to his hematuria reviewing his recent CT of abdomen, along with his most recent CBC and CMP and in review of medical records before and during the visit and in coordination of his care.

## 2021-06-23 ENCOUNTER — Telehealth: Payer: Self-pay

## 2021-06-23 NOTE — Telephone Encounter (Signed)
[name] with [facility] called to give a report on patient's lab results. Patient's ANC 0.3 and WBC 1.3. [name]

## 2021-06-23 NOTE — Telephone Encounter (Signed)
I have made [name] with [facility] aware that patient is receiving chemotherapy.  Sharronda Schweers T Brooks Sailors

## 2021-06-24 ENCOUNTER — Other Ambulatory Visit: Payer: Self-pay

## 2021-06-24 ENCOUNTER — Inpatient Hospital Stay: Payer: Medicare HMO

## 2021-06-24 ENCOUNTER — Telehealth: Payer: Self-pay

## 2021-06-24 DIAGNOSIS — Z5112 Encounter for antineoplastic immunotherapy: Secondary | ICD-10-CM | POA: Diagnosis not present

## 2021-06-24 DIAGNOSIS — C8333 Diffuse large B-cell lymphoma, intra-abdominal lymph nodes: Secondary | ICD-10-CM

## 2021-06-24 LAB — CBC WITH DIFFERENTIAL (CANCER CENTER ONLY)
Abs Immature Granulocytes: 0.05 10*3/uL (ref 0.00–0.07)
Basophils Absolute: 0 10*3/uL (ref 0.0–0.1)
Basophils Relative: 1 %
Eosinophils Absolute: 0.1 10*3/uL (ref 0.0–0.5)
Eosinophils Relative: 6 %
HCT: 26 % — ABNORMAL LOW (ref 39.0–52.0)
Hemoglobin: 8.4 g/dL — ABNORMAL LOW (ref 13.0–17.0)
Immature Granulocytes: 3 %
Lymphocytes Relative: 42 %
Lymphs Abs: 0.8 10*3/uL (ref 0.7–4.0)
MCH: 25.7 pg — ABNORMAL LOW (ref 26.0–34.0)
MCHC: 32.3 g/dL (ref 30.0–36.0)
MCV: 79.5 fL — ABNORMAL LOW (ref 80.0–100.0)
Monocytes Absolute: 0.5 10*3/uL (ref 0.1–1.0)
Monocytes Relative: 29 %
Neutro Abs: 0.4 10*3/uL — CL (ref 1.7–7.7)
Neutrophils Relative %: 19 %
Platelet Count: 193 10*3/uL (ref 150–400)
RBC: 3.27 MIL/uL — ABNORMAL LOW (ref 4.22–5.81)
RDW: 15.4 % (ref 11.5–15.5)
WBC Count: 1.9 10*3/uL — ABNORMAL LOW (ref 4.0–10.5)
nRBC: 0 % (ref 0.0–0.2)

## 2021-06-24 LAB — CMP (CANCER CENTER ONLY)
ALT: 27 U/L (ref 0–44)
AST: 20 U/L (ref 15–41)
Albumin: 3.1 g/dL — ABNORMAL LOW (ref 3.5–5.0)
Alkaline Phosphatase: 78 U/L (ref 38–126)
Anion gap: 9 (ref 5–15)
BUN: 19 mg/dL (ref 8–23)
CO2: 23 mmol/L (ref 22–32)
Calcium: 9.1 mg/dL (ref 8.9–10.3)
Chloride: 109 mmol/L (ref 98–111)
Creatinine: 1.93 mg/dL — ABNORMAL HIGH (ref 0.61–1.24)
GFR, Estimated: 36 mL/min — ABNORMAL LOW (ref >60)
Glucose, Bld: 210 mg/dL — ABNORMAL HIGH (ref 70–99)
Potassium: 4.3 mmol/L (ref 3.5–5.1)
Sodium: 141 mmol/L (ref 135–145)
Total Bilirubin: 0.4 mg/dL (ref 0.3–1.2)
Total Protein: 5.5 g/dL — ABNORMAL LOW (ref 6.5–8.1)

## 2021-06-24 LAB — URIC ACID: Uric Acid, Serum: 3 mg/dL — ABNORMAL LOW (ref 3.7–8.6)

## 2021-06-24 LAB — LACTATE DEHYDROGENASE: LDH: 218 U/L — ABNORMAL HIGH (ref 98–192)

## 2021-06-24 NOTE — Telephone Encounter (Signed)
CRITICAL VALUE STICKER  CRITICAL VALUE: ANC = 0.4  RECEIVER (on-site recipient of call): Yetta Glassman, CMA  DATE & TIME NOTIFIED: 06/24/21 at 1:38p  MESSENGER (representative from lab): Lelan Pons  MD NOTIFIED: Dr. Lorenso Courier  TIME OF NOTIFICATION: 06/24/21 at 1:40p  RESPONSE: Notification given to Joesphine Bare, RN for follow-up with the provider.

## 2021-06-25 NOTE — Telephone Encounter (Signed)
Heather informed to fax labs to oncology as well. Heather verbalized understanding and will pass the message to the home health nurse

## 2021-06-28 ENCOUNTER — Other Ambulatory Visit: Payer: Self-pay | Admitting: Hematology and Oncology

## 2021-06-30 ENCOUNTER — Other Ambulatory Visit: Payer: Self-pay | Admitting: Physician Assistant

## 2021-06-30 DIAGNOSIS — C8338 Diffuse large B-cell lymphoma, lymph nodes of multiple sites: Secondary | ICD-10-CM

## 2021-07-01 ENCOUNTER — Telehealth: Payer: Self-pay | Admitting: *Deleted

## 2021-07-01 ENCOUNTER — Inpatient Hospital Stay (HOSPITAL_BASED_OUTPATIENT_CLINIC_OR_DEPARTMENT_OTHER): Payer: Medicare HMO | Admitting: Physician Assistant

## 2021-07-01 ENCOUNTER — Other Ambulatory Visit: Payer: Self-pay

## 2021-07-01 ENCOUNTER — Other Ambulatory Visit: Payer: Self-pay | Admitting: Hematology and Oncology

## 2021-07-01 ENCOUNTER — Other Ambulatory Visit (HOSPITAL_COMMUNITY): Payer: Self-pay

## 2021-07-01 ENCOUNTER — Encounter: Payer: Self-pay | Admitting: Hematology and Oncology

## 2021-07-01 ENCOUNTER — Inpatient Hospital Stay: Payer: Medicare HMO

## 2021-07-01 ENCOUNTER — Other Ambulatory Visit: Payer: Self-pay | Admitting: Urology

## 2021-07-01 VITALS — BP 131/72 | HR 73 | Temp 98.2°F | Resp 16

## 2021-07-01 VITALS — BP 126/66 | HR 68 | Temp 97.8°F | Resp 18 | Ht 70.0 in | Wt 259.5 lb

## 2021-07-01 DIAGNOSIS — Z5112 Encounter for antineoplastic immunotherapy: Secondary | ICD-10-CM | POA: Diagnosis not present

## 2021-07-01 DIAGNOSIS — C8338 Diffuse large B-cell lymphoma, lymph nodes of multiple sites: Secondary | ICD-10-CM | POA: Diagnosis not present

## 2021-07-01 DIAGNOSIS — R310 Gross hematuria: Secondary | ICD-10-CM

## 2021-07-01 DIAGNOSIS — R600 Localized edema: Secondary | ICD-10-CM | POA: Diagnosis not present

## 2021-07-01 DIAGNOSIS — C8333 Diffuse large B-cell lymphoma, intra-abdominal lymph nodes: Secondary | ICD-10-CM

## 2021-07-01 LAB — CBC WITH DIFFERENTIAL (CANCER CENTER ONLY)
Abs Immature Granulocytes: 0.16 10*3/uL — ABNORMAL HIGH (ref 0.00–0.07)
Basophils Absolute: 0.1 10*3/uL (ref 0.0–0.1)
Basophils Relative: 2 %
Eosinophils Absolute: 0.2 10*3/uL (ref 0.0–0.5)
Eosinophils Relative: 4 %
HCT: 26.8 % — ABNORMAL LOW (ref 39.0–52.0)
Hemoglobin: 8.9 g/dL — ABNORMAL LOW (ref 13.0–17.0)
Immature Granulocytes: 5 %
Lymphocytes Relative: 27 %
Lymphs Abs: 1 10*3/uL (ref 0.7–4.0)
MCH: 25.9 pg — ABNORMAL LOW (ref 26.0–34.0)
MCHC: 33.2 g/dL (ref 30.0–36.0)
MCV: 78.1 fL — ABNORMAL LOW (ref 80.0–100.0)
Monocytes Absolute: 0.8 10*3/uL (ref 0.1–1.0)
Monocytes Relative: 22 %
Neutro Abs: 1.5 10*3/uL — ABNORMAL LOW (ref 1.7–7.7)
Neutrophils Relative %: 40 %
Platelet Count: 202 10*3/uL (ref 150–400)
RBC: 3.43 MIL/uL — ABNORMAL LOW (ref 4.22–5.81)
RDW: 15.9 % — ABNORMAL HIGH (ref 11.5–15.5)
WBC Count: 3.6 10*3/uL — ABNORMAL LOW (ref 4.0–10.5)
nRBC: 0 % (ref 0.0–0.2)

## 2021-07-01 LAB — CMP (CANCER CENTER ONLY)
ALT: 35 U/L (ref 0–44)
AST: 22 U/L (ref 15–41)
Albumin: 3.1 g/dL — ABNORMAL LOW (ref 3.5–5.0)
Alkaline Phosphatase: 77 U/L (ref 38–126)
Anion gap: 9 (ref 5–15)
BUN: 21 mg/dL (ref 8–23)
CO2: 23 mmol/L (ref 22–32)
Calcium: 8.8 mg/dL — ABNORMAL LOW (ref 8.9–10.3)
Chloride: 109 mmol/L (ref 98–111)
Creatinine: 1.84 mg/dL — ABNORMAL HIGH (ref 0.61–1.24)
GFR, Estimated: 38 mL/min — ABNORMAL LOW (ref >60)
Glucose, Bld: 203 mg/dL — ABNORMAL HIGH (ref 70–99)
Potassium: 4.3 mmol/L (ref 3.5–5.1)
Sodium: 141 mmol/L (ref 135–145)
Total Bilirubin: 0.3 mg/dL (ref 0.3–1.2)
Total Protein: 5.5 g/dL — ABNORMAL LOW (ref 6.5–8.1)

## 2021-07-01 LAB — LACTATE DEHYDROGENASE: LDH: 261 U/L — ABNORMAL HIGH (ref 98–192)

## 2021-07-01 LAB — URIC ACID: Uric Acid, Serum: 2.8 mg/dL — ABNORMAL LOW (ref 3.7–8.6)

## 2021-07-01 MED ORDER — SODIUM CHLORIDE 0.9 % IV SOLN: Freq: Once | INTRAVENOUS | Status: AC

## 2021-07-01 MED ORDER — SODIUM CHLORIDE 0.9 % IV SOLN
750.0000 mg/m2 | Freq: Once | INTRAVENOUS | Status: AC
  Administered 2021-07-01: 1740 mg via INTRAVENOUS
  Filled 2021-07-01: qty 87

## 2021-07-01 MED ORDER — TENOFOVIR DISOPROXIL FUMARATE 300 MG PO TABS
300.0000 mg | ORAL_TABLET | ORAL | 11 refills | Status: DC
  Filled 2021-07-01 (×2): qty 15, 30d supply, fill #0

## 2021-07-01 MED ORDER — SODIUM CHLORIDE 0.9 % IV SOLN
375.0000 mg/m2 | Freq: Once | INTRAVENOUS | Status: AC
  Administered 2021-07-01: 900 mg via INTRAVENOUS
  Filled 2021-07-01: qty 40

## 2021-07-01 MED ORDER — SODIUM CHLORIDE 0.9 % IV SOLN
50.0000 mg/m2 | Freq: Once | INTRAVENOUS | Status: AC
  Administered 2021-07-01: 120 mg via INTRAVENOUS
  Filled 2021-07-01: qty 6

## 2021-07-01 MED ORDER — VINCRISTINE SULFATE CHEMO INJECTION 1 MG/ML
2.0000 mg | Freq: Once | INTRAVENOUS | Status: AC
  Administered 2021-07-01: 2 mg via INTRAVENOUS
  Filled 2021-07-01: qty 2

## 2021-07-01 MED ORDER — ACETAMINOPHEN 325 MG PO TABS
650.0000 mg | ORAL_TABLET | Freq: Once | ORAL | Status: AC
  Administered 2021-07-01: 650 mg via ORAL
  Filled 2021-07-01: qty 2

## 2021-07-01 MED ORDER — PALONOSETRON HCL INJECTION 0.25 MG/5ML
0.2500 mg | Freq: Once | INTRAVENOUS | Status: AC
  Administered 2021-07-01: 0.25 mg via INTRAVENOUS
  Filled 2021-07-01: qty 5

## 2021-07-01 MED ORDER — DIPHENHYDRAMINE HCL 25 MG PO CAPS
50.0000 mg | ORAL_CAPSULE | Freq: Once | ORAL | Status: AC
  Administered 2021-07-01: 50 mg via ORAL
  Filled 2021-07-01: qty 2

## 2021-07-01 MED ORDER — SODIUM CHLORIDE 0.9% FLUSH: 10.0000 mL | Freq: Once | INTRAVENOUS | Status: DC

## 2021-07-01 MED ORDER — SODIUM CHLORIDE 0.9 % IV SOLN
10.0000 mg | Freq: Once | INTRAVENOUS | Status: AC
  Administered 2021-07-01: 10 mg via INTRAVENOUS
  Filled 2021-07-01: qty 10

## 2021-07-01 NOTE — Progress Notes (Signed)
When assuming care of this patient, he verbalized he has transportation concerns and is requesting for quicker administration of rituximab. Dr. Alen Blew aware of patient's request. Patient tolerated first infusion with no issues. Per Dr. Alen Blew, ok to titrate under rapid rituximab protocol.

## 2021-07-01 NOTE — Telephone Encounter (Signed)
Call made to Dr. Carmon Ginsberg office [facility] Cardiologist in [place]). Spoke with triage regarding pt's Eliquis and Plavix. Advised that pt has ureteral stents in place and is actively bleeding daily. Pt's HGB is 8.9. Pt is also on chemo for Diffuse large B cell Lymphoma.  The question is whether pt needs to stay on Eliquis and Plavix due to current bleeding situation.  They state they will discuss with MD and call back  @ [phone].

## 2021-07-01 NOTE — Progress Notes (Signed)
Ok to treat with elevated SCR and anc of 1.5 per Dede Query, PA

## 2021-07-01 NOTE — Patient Instructions (Signed)
[facility]  Discharge Instructions: Thank you for choosing [facility] to provide your oncology and hematology care.   If you have a lab appointment with the [facility], please go directly to the [facility] and check in at the registration area.   Wear comfortable clothing and clothing appropriate for easy access to any Portacath or PICC line.   We strive to give you quality time with your provider. You may need to reschedule your appointment if you arrive late (15 or more minutes).  Arriving late affects you and other patients whose appointments are after yours.  Also, if you miss three or more appointments without notifying the office, you may be dismissed from the clinic at the provider's discretion.      For prescription refill requests, have your pharmacy contact our office and allow 72 hours for refills to be completed.    Today you received the following chemotherapy and/or immunotherapy agents vincristine, cytoxan, etoposide, rituximab      To help prevent nausea and vomiting after your treatment, we encourage you to take your nausea medication as directed.  BELOW ARE SYMPTOMS THAT SHOULD BE REPORTED IMMEDIATELY: *FEVER GREATER THAN 100.4 F (38 C) OR HIGHER *CHILLS OR SWEATING *NAUSEA AND VOMITING THAT IS NOT CONTROLLED WITH YOUR NAUSEA MEDICATION *UNUSUAL SHORTNESS OF BREATH *UNUSUAL BRUISING OR BLEEDING *URINARY PROBLEMS (pain or burning when urinating, or frequent urination) *BOWEL PROBLEMS (unusual diarrhea, constipation, pain near the anus) TENDERNESS IN MOUTH AND THROAT WITH OR WITHOUT PRESENCE OF ULCERS (sore throat, sores in mouth, or a toothache) UNUSUAL RASH, SWELLING OR PAIN  UNUSUAL VAGINAL DISCHARGE OR ITCHING   Items with * indicate a potential emergency and should be followed up as soon as possible or go to the Emergency Department if any problems should occur.  Please show the CHEMOTHERAPY ALERT CARD or  IMMUNOTHERAPY ALERT CARD at check-in to the Emergency Department and triage nurse.  Should you have questions after your visit or need to cancel or reschedule your appointment, please contact [facility]  Dept: [phone]  and follow the prompts.  Office hours are 8:00 a.m. to 4:30 p.m. Monday - Friday. Please note that voicemails left after 4:00 p.m. may not be returned until the following business day.  We are closed weekends and major holidays. You have access to a nurse at all times for urgent questions. Please call the main number to the clinic Dept: [phone] and follow the prompts.   For any non-urgent questions, you may also contact your provider using MyChart. We now offer e-Visits for anyone 46 and older to request care online for non-urgent symptoms. For details visit [email or web address].   Also download the MyChart app! Go to the app store, search "MyChart", open the app, select [facility], and log in with your MyChart username and password.  Due to Covid, a mask is required upon entering the hospital/clinic. If you do not have a mask, one will be given to you upon arrival. For doctor visits, patients may have 1 support person aged 17 or older with them. For treatment visits, patients cannot have anyone with them due to current Covid guidelines and our immunocompromised population.   Vincristine injection ??? ???????????? ????? ??? ?????????? ?????????? ??? ???????????????????? ????????. ?? ????????? ???? ??????????????? ??????. ??? ????????? ??????????? ??? ??????? ?????? ????? ??????????????? ????????, ????? ??? ??????? ????????, ??????, ????????????? ???????,????????????? (??????? ????????? ?????), ??????????????? ? ??????? ???????. ??? ????????? ????? ??????????? ? ?????? ?????; ???? ? ??? ????????? ???????,?????????? ? ???????????? ????????? ??? ??????????. ???????????????? ????????? ???????? (????????):  Oncovin, Vincasar PFS ??? ??? ???????  ?????????? ???????????? ????????? ?? ?????? ?????? ??????????????? ?????????? ?????, ???? ?? ? ??? ???? ?? ????????? ?????????: ??????????? ?????; ???????; ???????? (???????? ???????? ????, ????????????? ????????? ??? ??????); ??????????? ?????; ??????????? ??????; ??????????? ??????; ??????????? ??????? ???????, ????????, ??????? ?????-????-???? (???); ???????????? ? ???????? ??????? ??? ? ????????? ?????; ????????? ??? ????????????? ??????? ?? ?????????? ??? ?????? ???????????????????? ?????????; ????????? ??? ????????????? ??????? ?? ?????? ?????????, ??????? ????????, ????????? ??? ???????????; ???????????? ??? ???????????? ????????????; ????????? ??????; ??? ??? ??????? ????????? ??? ?????????? ??? ????????? ????????????? ??? ????????????? ????????. ?? ???????? ? ???????? ??? ??????? ?????????? ????????? ??????????? ??????????. ??? ????????? ????, ?????, ?????? ??? ?????? ????????? ???????? ? ????? ???????? ?????????????????? ?? ???? ????? ??? ??????? ???????????? ?????????. ???????? ???????? ? ??????????? ?????????? ????? ????????? ?????. ???????? ?? ??, ??? ??? ????????? ????? ??????????? ??? ????????? ??????????, ???????????????? ???? ????????????????. ?????????????: ???? ??? ???????, ??? ?? ????????? ?????????? ???? ????? ?????????, ?????????? ?????????? ? ????????????????? ????? ??? ???????? ????????? ??? ???????? ?????????? ??????. ??????????: ??? ?????????????????????? ?????? ??? ???. ?? ???????? ?? ? ??????? ??????. ??? ?????????? ? ?????? ???????? ?????? ?????????? ????? ????????? ????????? ?????????. ???????? ????? ??? ??????? ?????????????????????, ???? ?? ?? ?????? ??????? ?? ????? ? ??????????? ?????. ? ??? ??? ????????? ????? ???????? ?? ??????????????? ????????? ???????????????? ?????????, ????? ??? ???????????, ???????????, ???????????, ???????????; ????????? ????????????????? ?????????, ????? ??? ????????; ???? ???????? ?? ???????? ???? ????????? ??????????????.  ???????????? ???????????? ????????? ?????? ???? ??????????? ???? ????????, ????????????? ????????, ?????????????? ?????????? ? ??????? ???????. ????? ???????? ??? ? ???????, ???????????? ??????????? ???????? ??? ??????????. ????????? ????????????? ???????? ?? ?????????????? ? ??????????? ???? ??????????. ?? ??? ????? ???????? ???????? ??? ????????????? ????? ?????????? ??? ????????? ????? ??????? ????????? ?????? ????????????. ??? ??????????? ???????, ????????? ??? ???????????? ?????? ?? ???????????????? ????? ?????????? ? ???????? ??????. ????????? ? ????? ???????? ????????. ??????????? ??????????? ???? ? ?????? ??????? ????????????, ???? ???? ?? ???????????? ????. ?? ????? ?????? ????? ????????? ??? ??????? ????????? ??????? ??????? ?????. ??? ????????? ???????? ?????. ???????????? ?????????? ????????, ?? ??????? ????, ?????? 2-3 ???. ? ?????? ?????????? ????? ? ??????? 3 ???? ?????????? ?????? ??? ??????? ???????????? ?????????. ? ????????? ??????? ??????????? ?????????????? ????????? ??? ?????? ? ??????????????????. ???????? ????????? ?? ?? ??????????. ??? ?????? ????? ????????? ??????? ???????? ????????????. ???????? ??????? ???????? ????? ? ???????????? ???????????? ??? ????????????? ? ????????? ????????????. ?????????? ??????????? ???????? ????? ????????? ?? ????. ?? ?????????????? ??????????? ?????????? ? ?????, ??????? ???????????? ????????? ??? ??????????. ??? ?????? ????? ????????? ??????? ???????? ????????? ?????????????. ??? ????????? ????? ???????? ??????????? ? ???????. ? ?????? ???????????? ???????????? ?????????????? ??????? ???????? ???? ?????? ? ?????? ??? ????????????????? ??????????. ????? ???????? ??????? ????? ???? ??? ?????? ????? ?????????? ???????? ???????, ? ??????? ??? ??????? ??? ????? ?????? ???????? ????? ?????????? ???????????? ?????????: ????????????? ???????, ????? ??? ?????? ????, ???, ??????????, ????????? ????, ??? ??? ?????; ??????????? ???????; ??????????? ????????  ??? ????????? ?????? ??? ??????????; ?????; ??????; ???? ?? ???; ???????? ????????; ??????? ??? ?????; ????, ?????????, ??????????? ??? ??????????? ? ????? ????????; ????, ???????? ??? ??????????? ? ?????? ? ??????; ????????? ??????????, ???? ? ???????; ?????????? ???????? (??????????); ???? ? ???????; ??????????? ??? ?????????????? ??? ????????? ?????????? ?????????? ????; ???????? ???????, ??????? ?????? ?? ??????? ???????????? ???????????? ???????? (???????? ????? ??? ??????? ???????????? ?????????, ???? ??? ???????????? ????????????? ??????????): ??????; ?????????; ???? ? ???????; ?????? ????????; ???? ???????? ?? ???????? ???? ????????? ???????? ????????. ?????????? ? ????? ?? ??????? ???????????? ???????? ????????. ?? ?????? ???????? ? ???????????????? ? FDA ?? ???????? 1-(267) [id number]. ??? ??????? ??????? ??? ?????????? ??? ????????? ???????? ?????? ??? ?????? ??????????? ?????????? ? ???????? ??????????. ??? ????????? ?? ???????? ??? ???????? ? ???????? ????????. ??????????: ???? ???????? ?? ???????? ???? ????????? ????????. ???? ? ??? ????????? ???????, ?????????? ??????? ?????????, ?????????? ? ?????, ????????????? ??????? ???????????? ?????????.  2022 Elsevier/Gold Standard (2020-03-06 00:00:00)  Cyclophosphamide Injection ??? ???????????? ????? ??? ?????????? ????????????? ??? ???????????????????? ????????. ?? ????????? ???? ??????????????? ??????. ??? ????????? ??????????? ??? ??????? ?????? ??????????????? ????????, ????? ??? ???????, ???????, ??????, ??? ?????????????? ? ????????. ??? ????????? ????? ??????????? ? ?????? ?????; ???? ? ??? ????????? ???????,?????????? ? ???????????? ????????? ??? ??????????. ???????????????? ????????? ???????? (????????): Cytoxan, Neosar ??? ??? ??????? ?????????? ???????????? ????????? ?? ?????? ?????? ??????????????? ?????????? ?????, ???? ?? ? ??? ???? ?? ????????? ?????????: ??????????? ??????; ??????? ?  ???????; ????????; ??????????? ?????; ??????????? ??????; ???????? ?????????? ?????? ?????, ????? ??? ?????????, ?????????? ??? ??????????; ?????????? ???????????; ???????????? ? ???????? ??????? ??? ? ????????? ?????; ?????????? ??? ????????? ???????? ?????; ??????????? ??? ??????????????; ????????? ??? ????????????? ??????? ?? ?????????????; ????????? ??? ????????????? ??????? ?? ?????? ?????????; ????????? ??? ????????????? ??????? ?? ??????? ????????, ????????? ??? ???????????; ???????????? ??? ???????????? ????????????; ????????? ??????; ??? ??? ??????? ????????? ??? ?????????? ??? ????????? ????????????? ??? ??????????????? ????????, ? ????? ??? ????????????? ????????? ? ?????????? ????????. ?? ???????? ? ???????? ?????????? ?????????? ????????? ??????????? ??????????. ???????? ???????? ? ??????????? ?????????? ????? ????????? ?????. ? ???? ???????????????? ?????? ????????????. ?????????????: ???? ??? ???????, ??? ?? ????????? ?????????? ???? ????? ?????????, ?????????? ?????????? ? ????????????????? ????? ??? ???????? ????????? ??? ???????? ?????????? ??????. ??????????: ??? ?????????????????????? ?????? ??? ???. ?? ???????? ?? ? ??????? ??????. ??? ?????????? ? ?????? ???????? ?????? ?????????? ????? ????????? ????????? ?????????. ???????? ????? ??? ??????? ?????????????????????, ???? ?? ?? ?????? ??????? ?? ????? ? ??????????? ?????. ? ??? ??? ????????? ????? ???????? ?? ??????????????? ??????????? ?; ??????????; ????????? ??????????????? ????????? ??? ??????? ???-???????? ??? ????????; ????????? ????????????? ?????????, ????????? ??? ??????? ??????????? ?????? ??? ???????; ????????? ?????????, ????????????? ??? ??????? ??? ?????????????? ????????, ????? ??? ????????; ????????? ?????? ????????? ??? ??????? ??????????????? ????????; ???????????; ??????????; ???????????; ?????????, ????????????? ??????????? ??? ?????????? ????????; ?????????, ????????????? ?????????? ??????  ?????; ????????????; ???? ???????? ?? ???????? ???? ????????? ??????????????. ???????????? ???????????? ????????? ?????? ???? ??????????? ???? ????????, ????????????? ????????, ?????????????? ?????????? ? ??????? ???????. ????? ???????? ??? ? ???????, ???????????? ??????????? ???????? ??? ??????????. ????????? ????????????? ???????? ?? ?????????????? ? ??????????? ???? ??????????. ?? ??? ????? ???????? ???????? ??? ????????????? ????? ?????????? ?? ????? ?????? ????? ????????? ?? ?????? ??? ?????????? ??????????? ?????. ?? ????? ?????? ????? ????????? ??? ??????? ????????? ??????? ??????? ?????. ???????????? ???? ??? ?????? ???????? ? ???????????? ? ??????????. ?????????????, ???? ?????. ? ?????? ????????? ???????? ????? ??????? ?????. ???????? ? ????? ??? ??????? ???????????? ?????????, ???????? ?? ??? ????????? ?????. ??????????? ???????? ???? ??????????? ??????????, ??????????? ? ????? ???????, ? ???, ??? ?? ?????????? ??? ?????????. ????? ????????? ????????, ????? ??? ???????????? ? ???????????, ?????? ? ????????? ????? ???????? ? ????????????? ???????. ??? ??????? ??????????? ????????? ????, ???????? ?????, ????????, ??????, ??????????? ? ?????????? ??????. ??? ????? ???????????? ?? 30 ????? ???????????? ?????. ????????? ???????????? ?? ????? ?????? ????? ????????? ? ? ??????? 1 ???? ????? ??????????? ??? ??????. ???????? ??????? ???????? ???????????? ????????? ? ???????????? ???????????? ??? ????????????? ? ????????? ????????????. ?? ????? ?????? ????? ????????? ? ?? ?????????? 4 ??????? ????? ????????? ??? ?????????? ???????? ?? ??????? ???????? ???????. ?????????? ??????????? ???????? ????? ????????? ?? ????. ?????????? ? ?????? ????? ??? ??????? ???????????? ??????????? ????? ????????? ???????????. ?? ????? ?????????? ????? ????????? ? ? ??????? 1 ?????? ????? ??? ????????????????? ??????? ??????? ??????. ? ????????? ?????? ??? ????????? ???????? ?????????? ???????????????. ? ?????? ??? ?????????  ????? ???????? ??????????? ? ???????. ? ?????? ???????????? ???????????? ?????????????? ??????? ???????? ???? ?????? ? ?????? ??? ????????????????? ??????????. ? ????????? ?????? ??? ?????????? ????? ????????? ????????? ????? ??????????????. ??? ????????? ????? ???????? ??????????? ?????? ? ???????. ? ?????? ???????????? ???????????? ?????????????? ??????? ???????? ???? ?????? ??????? ??? ?????? ??????????? ??????????. ?????????? ?? ??????? ? ????? ??? ??????? ???????????? ????????? ? ?????? ????????? ???????????, ??????, ???? ? ????? ??? ?????? ????????? ???????? ??? ??????. ?? ??????????? ????????????. ??? ????????? ????? ??????? ??????????? ?????? ????????? ????????????? ?????????. ???????????? ???????? ???????? ???????????? ??????. ?? ?????????? ?????????, ?????????? ???????, ????????????, ?????????, ????????? ??? ??????????, ??? ???????????? ????? ??? ??????? ???????????? ?????????. ???????????? ????? ?????? ????????? ???????????. ???????? ? ?????? ??? ?????? ??????????? ?????????? ???? ???????? ? ??? ???????????????? ???????????????. ??? ?????? ????? ????????? ???????? ?????????????? ???????? ????????? ????? ??????????????? ???????????????. ???? ? ??? ????????????? ???????? ??? ?????? ?????????, ???????? ????? ?????????? ???????????? ????????? ?? ????????????? ????? ?????????. ?????? ????????? ??? ?????? ????? ?????? ?????? ??? ?????, ? ????? ??? ??????????? ????????????, ????????? ??? ???? ?????????? ??????????? ????????????? ??? ????????????. ??? ????????????? ?????????? ?????????????????????? ????????? ???????? ??????????? ? ?????? ????? ?????????. ????? ???????? ??????? ????? ???? ??? ?????? ????? ?????????? ???????? ???????, ? ??????? ??? ??????? ??? ????? ?????? ???????? ????? ?????????? ???????????? ?????????: ????????????? ???????, ????? ??? ?????? ????, ???, ??????????, ????????? ????, ??? ??? ?????; ??????????? ???????; ??????? ??? ?????; ???????? ? ???????? ????????????, ????? ???  ??????????? ??? ?????? ????????????? ???; ??????? ??? ?????-?????????? ???? ????; ????? ?????? ??? ?????? ??????????? ?????, ???????????? ???????? ????; ??????? ????? ?? ????; ????????? ?????? ??? ????????????? ? ?????, ???????????? ?? ????? ??? ????; ??????????? ???????? ? ???????? ????????? ???????????????, ????? ??? ??????? ??????????? ????????????, ????????? ?????????? ????? ????, ????????? ???????, ???????, ??????; ??????????? ???????? ? ???????? ????????, ????? ??? ????????? ???????????, ?????, ??????, ???? ? ?????, ??????????? ??? ???????????? ??????????????; ??????????? ???????? ? ???????? ??????????? ?????, ????? ??? ???????????? ?????????????? ??? ????????? ?????????? ????; ??????????? ???????? ??????????? ??????, ???????? ???? ?????-??????? ??? ??????????? ?????, ????? ??????????? ??? ?????????????? ????????, ?????????????? ????, ????????? ????????, ???????, ???? ? ?????? ??????????, ????????? ???????? ??? ?????? ???, ?????????? ?????? ???? ??? ????; ???????? ???????, ??????? ?????? ?? ??????? ???????????? ???????????? ???????? (???????? ????? ??? ??????? ???????????? ?????????, ???? ??? ???????????? ????????????? ??????????): ??????????? ????????; ????????? ?????; ??????; ??????? ????? ? ????; ?????????; ???????? ????; ?????? ????????; ???????? ???????????; ??????????? ???????? ? ???????? ???????? ?????????? ??????????? ??? ??????, ????? ??? ????????? ???????? ??? ?????????, ?????????????? ????????? ??? ???????, ???????; ????????? ????? ????. ???? ???????? ?? ???????? ???? ????????? ???????? ????????. ?????????? ? ????? ?? ??????? ???????????? ???????? ????????. ?? ?????? ???????? ? ???????????????? ?  FDA ?? ???????? [id number]. ??? ??????? ??????? ??? ?????????? ??? ????????? ???????? ?????? ??? ?????? ??????????? ?????????? ? ???????? ??????????. ??? ????????? ?? ???????? ??? ???????? ? ???????? ????????. ??????????: ???? ???????? ?? ???????? ???? ????????? ????????. ???? ? ???  ????????? ???????, ?????????? ??????? ?????????, ?????????? ? ?????, ????????????? ??????? ???????????? ?????????.  2022 Elsevier/Gold Standard (2020-03-06 00:00:00)  Etoposide, VP-16 capsules ??? ???????????? ????? ??? ?????????? ????????, VP-16, ??? ???????????????????? ????????. ?? ??????????? ??? ?????????????????????? ???? ?????? ? ?????? ??????????????? ????????. ??? ????????? ????? ??????????? ? ?????? ?????; ???? ? ??? ????????? ???????,?????????? ? ???????????? ????????? ??? ??????????. ???????????????? ????????? ???????? (????????): VePesid ??? ??? ??????? ?????????? ???????????? ????????? ?? ?????? ?????? ??????????????? ?????????? ?????, ???? ?? ? ??? ???? ?? ????????? ?????????: ????????; ??????????? ?????; ??????????? ??????; ????? ?????????? ?????? ?????, ????? ??? ??????????, ??????????? ??? ???????????; ????????? ??? ????????????? ??????? ?? ????????; ????????? ??? ????????????? ??????? ?? ?????? ?????????; ????????? ??? ????????????? ??????? ?? ??????? ????????, ????????? ??? ???????????; ???????????? ??? ???????????? ????????????; ????????? ??????; ??? ??? ??????? ????????? ??? ?????????? ?????????? ??? ????????? ??????, ??????? ???????? ????. ???????? ????????? ?? ???????? ??? ? ???????. ?? ???????????, ?? ???????????? ? ?? ?????????? ??? ?????????. ??? ????????? ? ?????????? ????????????? ???????? ????????. ?????????? ??? ????????? ????? ?????? ?????????? ???????. ?? ?????????? ??? ????????? ????, ??? ???????. ?? ??????????? ????? ????? ????????? ??????????????? ?????. ???????? ???????? ? ??????????? ?????????? ????? ????????? ?????. ? ???? ???????????????? ?????? ????????????. ?????????????: ???? ??? ???????, ??? ?? ????????? ?????????? ???? ????? ?????????, ?????????? ?????????? ? ????????????????? ????? ??? ???????? ????????? ??? ???????? ?????????? ??????. ??????????: ??? ?????????????????????? ?????? ??? ???. ?? ???????? ?? ? ??????? ??????. ??? ?????????? ? ??????  ???????? ?????? ?????????? ???? ?? ?????? ??????? ?????????, ??????? ??? ??? ????? ??????. ???? ???????????? ????? ?????? ????????? ????, ??????? ?????? ??? ????. ???????????? ??????? ??? ?????????????? ????. ? ??? ??? ????????? ????? ???????? ?? ??????????????? ??? ????????? ????? ????????????????? ?? ?????????? ???????????: ???????????; ????????; ???? ???????? ?? ???????? ???? ????????? ??????????????. ???????????? ???????????? ????????? ?????? ???? ??????????? ???? ????????, ????????????? ????????, ?????????????? ?????????? ? ??????? ???????. ????? ???????? ??? ? ???????, ???????????? ??????????? ???????? ??? ??????????. ????????? ????????????? ???????? ?? ?????????????? ? ??????????? ???? ??????????. ?? ??? ????? ???????? ???????? ??? ????????????? ????? ?????????? ????????? ????????? ????? ??? ??????? ???????????? ????????? ??? ?????????? ?? ????? ??????????. ??? ????????? ????? ??????? ????????? ?????? ????????????. ??? ??????????? ???????, ????????? ??? ???????????? ?????? ?? ???????????????? ????? ?????????? ? ???????? ??????. ????????? ? ????? ???????? ????????. ??????????? ???? ??????? ???? ? ?????? ??????? ????????????, ???? ???? ?????????????? ????. ? ????????? ??????? ??????????? ?????????????? ????????? ??? ?????? ? ??????????????????. ???????? ????????? ?? ?? ??????????. ?????????? ?? ??????? ? ????? ??? ??????? ???????????? ????????? ? ?????? ????????? ???????????, ??????, ???? ? ????? ??? ?????? ????????? ???????? ??? ??????. ?? ??????????? ????????????. ??? ????????? ????? ??????? ??????????? ?????? ????????? ????????????? ?????????. ???????????? ???????? ???????? ???????????? ??????. ??? ????????? ???????????? ??????????? ????????????? ??? ????????? ??????????????. ? ?????? ?????????? ???????????? ?????????? ? ????? ??? ??????????????????? ?????????. ???????? ? ?????? ???? ???????? ? ??? ???????????????? ???????????????. ??? ?????? ????? ????????? ???????? ????????? ????? ?????????  ????? ??????????????????????????????. ????????? ???????????? ?? ????? ?????? ????? ????????? ? ? ??????? ??? ??????? 6 ??????? ????? ??? ???????????. ???????? ??????? ???????? ????? ? ???????????? ???????????? ??? ????????????? ? ????????? ????????????. ????? ??????? ?????? ????? ????????? ? ????????? ? ???????????? ?????? ????????? ????? ?? ???????????? ?????? ???? ?????????????. ?????????? ??????????? ???????? ????? ????????? ?? ????. ?? ?????????????? ??????????? ?????????? ? ?????, ??????? ???????????? ????????? ??? ??????????. ??? ?????? ????? ????????? ??????????????? ????????? ??????? ??????. ???????, ??????????? ??? ?????????, ??? ??????? ????????? ? ????????? ?????? ???????????? ????????? ???????????? ?? ?????????? ????? ??????? ??????? ? ? ??????? ??? ???????  4 ??????? ????? ??? ?????????. ????????? ??????????? ????????? ???? ? ?????? ?????????? (??????? ????????????). ? ?????? ???????????? ????????? ??????????????? ?????????? ? ?????. ?? ????? ??????? ???? ?????????? ? ?? ?????????? 4 ??????? ????? ??????????? ??? ?????? ?????? ???? ??????? ??????. ???????? ??????? ???????? ????? ? ??????? ??????? ???????.??? ????????? ????? ????????? ????? ??????????????. ????? ???????? ??????? ????? ???? ??? ?????? ????? ?????????? ???????? ???????, ? ??????? ??? ??????? ??? ????? ?????? ???????? ????? ?????????? ???????????? ?????????: ????????????? ???????, ????? ??? ?????? ????, ???, ??????????, ????????? ????, ??? ??? ?????; ????? ?????????? ?????? ????? - ????? ????? ????????? ????? ????????? ?????????? ??????????, ??????????? ??? ??????????? ? ?????. ????????? ????? ????????????? ? ????????????. ??????? ??? ?????; ??????????? ????, ??????????? ???????, ????????? ??? ?????? ???????????? ? ????????? ????, ? ??? ????? ????????? ?? ???; ??????????? ???????? ? ???????? ????????, ????? ??? ????????? ???????????, ?????, ??????, ???? ? ?????, ??????????? ??? ???????????? ??????????????; ??????????? ???????? ?  ???????? ???????? ?????????? ??????????? ??? ??????, ????? ??? ????????? ???????? ??? ?????? ???, ?????????????? ????????? ??? ???????, ???????, ????????? ???????. ????????? ???????????? ??? ??????; ???????? ???????, ??????? ?????? ?? ??????? ???????????? ???????????? ???????? (???????? ????? ??? ??????? ???????????? ?????????, ???? ??? ???????????? ????????????? ??????????): ????????? ???????? ???????? ????; ??????; ?????????; ?????? ????????; ???? ?? ???; ???? ???????? ?? ???????? ???? ????????? ???????? ????????. ?????????? ? ????? ?? ??????? ???????????? ???????? ????????. ?? ?????? ???????? ? ???????????????? ? FDA ?? ???????? [id number]. ??? ??????? ??????? ??? ?????????? ??????? ? ??????????? ??? ????? ?????. ??????? ? ???????????? ??? ??????????? 2-8 degrees? (36-46 ???????? ?? ????? ??????????). ?? ?????????????. ??????? ??????????? ??? ????????????????????????? ????? ????????? ????? ????????, ?????????? ?? ???????? ??? ????????. ??????????: ???? ???????? ?? ???????? ???? ????????? ????????. ???? ? ??? ????????? ???????, ?????????? ??????? ?????????, ?????????? ? ?????, ????????????? ??????? ???????????? ?????????.  2022 Elsevier/Gold Standard (2020-03-06 00:00:00)   Rituximab Injection ??? ???????????? ????? ??? ?????????? ?????????? ??? ?????????????? ????????. ?? ??????????? ??? ??????? ????????? ????? ??????????????? ????????, ????? ??? ????????????? ??????? ? ??????????? ???????????. ?? ????? ??????????? ??? ??????? ????????????? ???????,????????????? ? ????????????, ???????????????? ? ??????????. ??? ????????? ????? ??????????? ? ?????? ?????; ???? ? ??? ????????? ???????,?????????? ? ???????????? ????????? ??? ??????????. ???????????????? ????????? ???????? (????????): RIABNI, Rituxan, RUXIENCE ??? ??? ??????? ?????????? ???????????? ????????? ?? ?????? ?????? ??????????????? ?????????? ?????, ???? ?? ? ??? ???? ?? ????????? ?????????: ???????????; ???????????  ??????; ????????, ???????? ????????, ????? ??? ???????? ????, ????????????? ?????????, ??????? B ??? ??????; ???????????? ???????? ???????; ????????? ?????????? ????? (???????); ??????????? ?????; ???????? ?????????? ?????? ????? (??????????, ??????????? ??? ???????????); ??????????? ??????; ?????????? ? ???????? ??????? ??? ??????????????? ??????????; ????????? ??? ????????????? ??????? ?? ??????????; ????????? ??? ????????????? ??????? ?? ?????? ?????????; ????????? ??? ????????????? ??????? ?? ??????? ????????, ????????? ??? ???????????; ???????????? ??? ???????????? ????????????; ????????? ??????; ??? ??? ??????? ????????? ??? ?????????? ??? ????????? ????????????? ??? ????????????? ????????. ??? ????????? ?????????????? ??? ?????? ??????????? ?????????? ? ???????? ??? ???????. ????? ??????? ??????? ??? ????? ????????????? ??????????? ?????????? ?????????? (MedGuide). ?????? ??? ??????? ??????????? ???????? ??? ??????????. ?????????? ? ????? ??? ??????? ???????????? ????????? ?? ??????? ??????????? ?????????? ????? ????????? ?????. ???????? ?? ??, ??? ?? ???????????? ?????????? ??? ????????? ????? ????????? ????? ? 2 ???, ??????? ????????? ????????????????????. ?????????????: ???? ??? ???????, ??? ?? ????????? ?????????? ???? ????? ?????????, ?????????? ?????????? ? ????????????????? ????? ??? ???????? ????????? ??? ???????? ?????????? ??????. ??????????: ??? ?????????????????????? ?????? ??? ???. ?? ???????? ?? ? ??????? ??????. ??? ?????????? ? ?????? ???????? ?????? ?????????? ?? ????????? ????????? ?? ????? ??? ???????? ??????????? ???. ????? ????????? ????????? ?????????. ???????? ????? ??? ??????? ???????????? ?????????, ???? ???? ?????? ??????? ?? ????? ? ??????????? ?????. ? ??? ??? ????????? ????? ???????? ?? ??????????????? ?? ?????????? ???????????? ? ?????-???? ?? ??????????????? ??????????: ????? ??????? ??? ????????? ????? ????? ????????????????? ?? ??????????  ???????????: ?????????; ???? ???????? ?? ???????? ???? ????????? ??????????????. ???????????? ???????????? ????????? ?????? ???? ??????????? ???? ????????, ????????????? ????????, ?????????????? ?????????? ? ??????? ???????. ????? ???????? ??? ? ???????, ???????????? ??????????? ???????? ??? ??????????. ????????? ????????????? ???????? ?? ?????????????? ? ??????????? ???? ??????????. ?? ??? ????? ???????? ???????? ??? ????????????? ????? ?????????? ?? ????? ?????? ????? ????????? ?? ?????? ??? ?????????? ??????????? ?????. ??????? ?????? ????? ????????? ??? ??????? ????????? ??????? ??????? ?????. ??? ????????? ????? ???????? ??????? ???????, ????????? ? ????????. ????? ??????? ???? ????? ???????, ???? ??? ?????? ??????????? ???????? ????? ????????? ??? ?????? ?????????, ??????? ????? ????? ??????? ????? ????????? ??????? ?????????. ??????????? ???????? ????????? ????? ??? ??????????????????? ?????????. ??? ????????? ????? ???????? ???? ?????????????. ?????????? ?? ??????? ? ????? ??? ??????? ???????????? ????????? ? ?????? ????????? ???????????, ????????? ??????, ???? ? ????? ??? ?????? ????????? ???????? ??? ??????. ?? ???????????????????????. ???????????? ???????? ???????? ? ??????????? ??????. ?????????? ? ????? ??? ??????? ???????????? ????????? ? ?????? ???????? ? ??????? ????? ??? ???????? ?????, ? ????? ? ?????? ??????????? ???????????? ?????? ????????. ?? ?????????? ?????????, ?????????? ???????, ????????????, ?????????, ????????? ??? ??????????, ??? ???????????? ????? ??? ??????? ???????????? ?????????. ???????????? ????? ?????? ????????? ???????????. ??? ????????? ????? ??????? ??????? ?????? ???????. ??? ????? ????????? ????? ????????? ?????? ??? ??????? ????? ?????? ?????? ?????????. ??? ????????? ??????????? ???? ??? ????????? ?????????????? ????????? ? ????????? ? ????? ??????????????? ?????????? ? ????? ??? ??????? ???????????? ?????????. ???? ????? ???? ???????? ??? ??????????? ?????. ?????  ????????? ????? ?? ????? ????????? ????? ?????????? ??????? ??? ????????? ????????? ????. ???????? ????????? ??????? ???? ? ????????? ? ?????? ????, ??? ??? ???????????????????????? ????? ?? ??? ??? ? ??????????? ????????. ? ????????? ????????? ??? ????????? ????? ?????????????? ????????? ???????????? ??????????? ????????? ????? ? ???????? ? ??????. ??? ????????? ????? ????????? ??????, ????????, ????, ??????? ??? ??????? ????? ?? ?????????? ? ????? ??? ??????? ???????????? ?????????. ???? ?? ?? ?????? ????????? ?? ????? ?????? ??? ?????? ??????????? ??????????, ??????????????? ?????????? ?? ?????????????????? ? ????? ?????? ?????. ????????? ???????????? ?? ????? ??????? ???? ?????????? ? ? ??????? ??? ???????  12 ??????? ????? ??? ???????????. ???????? ??????? ???????? ???????????? ????????? ? ???????????? ???????????? ??? ????????????? ? ????????? ????????????. ?????????? ??????????? ??????????? ?????????????? ??????????? ????? ????????? ?? ????. ?????????? ? ?????? ????? ??? ??????? ???????????? ????????? ?? ????? ????????? ???????????. ?? ????? ??????? ???? ?????????? ? ?? ?????????? 12 ??????? ????? ????????? ??? ?????????? ??????? ?????? ???????????? ???????? ?????? ????????????. ?? ????? ?????????? ????? ????????? ? ? ??????? ??? ??????? 6 ??????? ????? ??? ??????????? ?????? ??????? ?????????????. ????? ???????? ??????? ????? ???? ??? ?????? ????? ?????????? ???????? ???????, ? ??????? ??? ??????? ??? ????? ?????? ???????? ????? ?????????? ???????????? ?????????: ????????????? ??????? (?????? ????, ??? ??? ??????????, ????????? ????, ??? ??? ?????); ??????; ???? (????????? ?????????? ????? ????, ????????? ???????, ????, ?????? ??? ?????? ????????? ???????????, ???????????? ???????); ?????????? ??? ???????; ??????? ???????? (???????????? ???????, ???? ??? ????????? ? ?????, ???, ????? ??? ?????, ????????? ???????? ??? ?????????); ???????? (?????????? ???????????, ?????, ??????, ???? ? ?????,  ??????????? ??? ???????????? ??????????????); ??????????? ????? (???????????? ?????????????? ??? ????????? ?????????? ????); ??????????? ?????? (???? ?????-??????? ??? ??????????? ?????, ????? ??????????? ??? ?????????????? ????????, ?????? ????????, ???? ? ?????? ??????????, ????????? ???????? ??? ?????????, ?????????? ?????? ???? ??? ????); ?????????? ???????????? ???????? (??????????????, ?????????????? ????????? ??? ???????, ???????, ????????? ???????? ??? ?????????); ?????? ??????? ??????????? (???????????? ???????, ?????????????? ?????????, ???????? ???????, ???????, ????????? ???????? ??? ?????????); ???? ?? ???; ??????????? ????, ??????????? ???????, ????????? ??? ?????? ???????????? ? ????????? ????, ? ??? ????? ????????? ?? ???; ???? ? ???????; ????????? ???????????? ??? ??????; ????????? ??????? (???????????? ??????? ? ???????? ??? ?????????? ???????). ?????; ???????? ???????, ??????? ?????? ?? ??????? ???????????? ???????????? ???????? (???????? ????? ??? ??????? ???????????? ?????????, ???? ??? ???????????? ????????????? ??????????): ???????? ????; ???? ? ????????; ???????? ???? ??? ????????; ???????; ???? ???????? ?? ???????? ???? ????????? ???????? ????????. ?????????? ? ????? ?? ??????? ???????????? ???????? ????????. ?? ?????? ???????? ? ???????????????? ? FDA ?? ???????? [phone]. ??? ??????? ??????? ??? ?????????? ??? ????????? ???????? ?????? ??? ?????? ??????????? ?????????? ? ???????? ??????????. ???????? ?? ????? ????????? ? ??? ????. ??????????: ???? ???????? ?? ???????? ???? ????????? ????????. ???? ? ??? ????????? ???????, ?????????? ??????? ?????????, ?????????? ? ?????, ????????????? ??????? ???????????? ?????????.  2022 Elsevier/Gold Standard (2020-12-04 00:00:00)

## 2021-07-01 NOTE — Progress Notes (Signed)
[name] [phone]   [phone]  PROGRESS NOTE  Patient Care Team: [name], MD as PCP - General (Family Medicine)  Hematological/Oncological History # Diffuse Large B Cell Lymphoma, Stage III/IV 04/30/2021: presented with RLQ abdominal pain. CT abdomen/pelvis shows pathologic mesenteric, retroperitoneal, and pelvic adenopathy, as described above. Moderate splenomegaly. Lobulated retroperitoneal soft tissue masses in the region of the right ureteropelvic junction likely resulting in mild right hydronephrosis secondary to extrinsic mass effect as well as within the right retroperitoneum 05/04/2021: needle core biopsy of right retroperitoneal lymph node shows large B cell lymphoma. 05/13/2021: establish care with Dr. Lorenso Courier 05/23/2021-06/01/2021: admitted with urosepsis. Concern for endocarditis, started on 6 week course abx via PICC line.   06/09/2021-06/11/2021: Cycle 1 of R-CEOP 07/01/2021-07/03/2021: Cycle 2 of R-CEOP  Interval History:  [name] 75 y.o. male with medical history significant for stage III/IV diffuse large B-cell lymphoma presents for a follow up visit. The patient's last visit was on 06/09/2021. In the interim since the last visit he completed Cycle 1 of R-CEOP.   On exam today, [name] reports that his energy and appetite are fairly stable. He is able to complete his daily activities on his own. He denies any weight changes. He denies any nausea, vomiting or abdominal pain. His bowel movements are regular without any diarrhea or constipatio. He is concerned about significant hematuria he has noticed. He is concerned that he is loosing too much blood. He is scheduled for cystoscopy and ureteral stent exchange on 07/28/2021. Patient notes worsening lower extremity edema in the past 3 weeks. He is no longer on HCTZ since recent hospitalization. He is able to ambulate without any difficulty but is concerned that his skin will  break down due to the swelling. He denies any fevers, chills, night sweats, shortness of breath, chest pain or cough. He has no other complaints.   MEDICAL HISTORY:  Past Medical History:  Diagnosis Date   Anginal pain ([place])    Coronary artery disease    Diabetes mellitus without complication ([place])    Dysrhythmia    Easy bruising 06/18/2021   Hematuria 06/18/2021   Hypertension    Sleep apnea     SURGICAL HISTORY: Past Surgical History:  Procedure Laterality Date   CARDIAC CATHETERIZATION     CYSTOSCOPY WITH RETROGRADE PYELOGRAM, URETEROSCOPY AND STENT PLACEMENT Right 05/01/2021   Procedure: CYSTOSCOPY WITH RIGHT  RETROGRADE PYELOGRAM,  AND RIGHT STENT PLACEMENT;  Surgeon: Festus Aloe, MD;  Location: [facility];  Service: Urology;  Laterality: Right;   EYE SURGERY     INJECTION OF SILICONE OIL Left 56/81/2751   Procedure: INJECTION OF SILICONE OIL;  Surgeon: Sherlynn Stalls, MD;  Location: [place];  Service: Ophthalmology;  Laterality: Left;   INJECTION OF SILICONE OIL Left 7/00/1749   Procedure: INJECTION OF SILICONE OIL;  Surgeon: Sherlynn Stalls, MD;  Location: [place];  Service: Ophthalmology;  Laterality: Left;   JOINT REPLACEMENT Right    hip   LASER PHOTO ABLATION Left 12/25/2020   Procedure: LASER PHOTO ABLATION;  Surgeon: Sherlynn Stalls, MD;  Location: [place];  Service: Ophthalmology;  Laterality: Left;   PARS PLANA VITRECTOMY Left 11/10/2020   Procedure: PARS PLANA VITRECTOMY WITH 25 GAUGE;  Surgeon: Sherlynn Stalls, MD;  Location: [place];  Service: Ophthalmology;  Laterality: Left;   PARS PLANA VITRECTOMY Left 12/25/2020   Procedure: PARS PLANA VITRECTOMY WITH 25 GAUGE IN LEFT EYE;  Surgeon: Sherlynn Stalls, MD;  Location: [place];  Service:  Ophthalmology;  Laterality: Left;   PERFLUORONE INJECTION Left 12/25/2020   Procedure: PERFLUORONE INJECTION;  Surgeon: Sherlynn Stalls, MD;  Location: [place];  Service: Ophthalmology;  Laterality: Left;   PHOTOCOAGULATION WITH LASER Left 11/10/2020    Procedure: PHOTOCOAGULATION WITH LASER;  Surgeon: Sherlynn Stalls, MD;  Location: [place];  Service: Ophthalmology;  Laterality: Left;   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   Procedure: RETINECTOMY LEFT EYE;  Surgeon: Sherlynn Stalls, MD;  Location: [place];  Service: Ophthalmology;  Laterality: Left;   SILICON OIL REMOVAL Left 4/48/1856   Procedure: SILICONE OIL REMOVAL;  Surgeon: Sherlynn Stalls, MD;  Location: [place];  Service: Ophthalmology;  Laterality: Left;   TEE WITHOUT CARDIOVERSION N/A 05/29/2021   Procedure: TRANSESOPHAGEAL ECHOCARDIOGRAM (TEE);  Surgeon: Werner Lean, MD;  Location[facility];  Service: Cardiovascular;  Laterality: N/A;    SOCIAL HISTORY: Social History   Socioeconomic History   Marital status: Married    Spouse name: Not on file   Number of children: Not on file   Years of education: Not on file   Highest education level: Not on file  Occupational History   Not on file  Tobacco Use   Smoking status: Never   Smokeless tobacco: Never  Vaping Use   Vaping Use: Never used  Substance and Sexual Activity   Alcohol use: Not Currently   Drug use: Never   Sexual activity: Yes  Other Topics Concern   Not on file  Social History Narrative   Not on file   Social Determinants of Health   Financial Resource Strain: Not on file  Food Insecurity: Not on file  Transportation Needs: Not on file  Physical Activity: Not on file  Stress: Not on file  Social Connections: Not on file  Intimate Partner Violence: Not on file    FAMILY HISTORY: Family History  Problem Relation Age of Onset   CAD Other     ALLERGIES:  has No Known Allergies.  MEDICATIONS:  Current Outpatient Medications  Medication Sig Dispense Refill   acetaminophen (TYLENOL) 650 MG CR tablet Take 650 mg by mouth every 8 (eight) hours as needed for pain or fever (headache).     allopurinol (ZYLOPRIM) 300 MG tablet Take 1 tablet (300 mg total) by mouth daily.  90 tablet 1   amiodarone (PACERONE) 200 MG tablet Take 200 mg by mouth every morning.     amLODipine (NORVASC) 10 MG tablet Take 1 tablet (10 mg total) by mouth daily. 30 tablet 1   apixaban (ELIQUIS) 5 MG TABS tablet Take 5 mg by mouth 2 (two) times daily.     atorvastatin (LIPITOR) 20 MG tablet Take 20 mg by mouth at bedtime.     clopidogrel (PLAVIX) 75 MG tablet Take 75 mg by mouth every morning.     finasteride (PROSCAR) 5 MG tablet Take 5 mg by mouth at bedtime.     glipiZIDE (GLUCOTROL) 10 MG tablet Take 10 mg by mouth 2 (two) times daily.     insulin glargine (LANTUS) 100 unit/mL SOPN Inject 34 Units into the skin at bedtime.     isosorbide mononitrate (IMDUR) 30 MG 24 hr tablet Take 30 mg by mouth every morning.     metFORMIN (GLUCOPHAGE) 1000 MG tablet Take 1,000 mg by mouth 2 (two) times daily.     metoprolol tartrate (LOPRESSOR) 50 MG tablet Take 1 tablet (50 mg total) by mouth 2 (two) times daily. 60 tablet 1   nitroGLYCERIN (NITROSTAT)  0.3 MG SL tablet Place 0.3 mg under the tongue every 5 (five) minutes x 3 doses as needed for chest pain.     piperacillin-tazobactam (ZOSYN) IVPB Inject 13.5 g into the vein daily. As a continuous infusion Indication:  pseudomonas bacteremia with possible endocarditis First Dose: Yes Last Day of Therapy:  07/08/21 Labs - Once weekly:  CBC/D and BMP, Labs - Every other week:  ESR and CRP Method of administration: Elastomeric (Continuous infusion) Method of administration may be changed at the discretion of home infusion pharmacist based upon assessment of the patient and/or caregiver's ability to self-administer the medication ordered. 40 Units 0   predniSONE (DELTASONE) 20 MG tablet Take 3 tablets (60 mg total) by mouth daily with breakfast. Take 3 pills per day starting on Day 1 of chemotherapy, for 5 total days. 15 tablet 5   prochlorperazine (COMPAZINE) 10 MG tablet Take 1 tablet (10 mg total) by mouth every 6 (six) hours as needed for nausea or  vomiting. 30 tablet 0   tenofovir (VIREAD) 300 MG tablet Take 1 tablet (300 mg total) by mouth every other day. 15 tablet 11   No current facility-administered medications for this visit.   Facility-Administered Medications Ordered in Other Visits  Medication Dose Route Frequency Provider Last Rate Last Admin   heparin lock flush 100 unit/mL  250 Units Intracatheter PRN Ledell Peoples IV, MD       sodium chloride flush (NS) 0.9 % injection 10 mL  10 mL Intracatheter PRN Ledell Peoples IV, MD       sodium chloride flush (NS) 0.9 % injection 10 mL  10 mL Intravenous Once Orson Slick, MD        REVIEW OF SYSTEMS:   Constitutional: ( - ) fevers, ( - )  chills , ( - ) night sweats Eyes: ( - ) blurriness of vision, ( - ) double vision, ( - ) watery eyes Ears, nose, mouth, throat, and face: ( - ) mucositis, ( - ) sore throat Respiratory: ( - ) cough, ( - ) dyspnea, ( - ) wheezes Cardiovascular: ( - ) palpitation, ( - ) chest discomfort, ( +) lower extremity swelling Gastrointestinal:  ( - ) nausea, ( - ) heartburn, ( - ) change in bowel habits Skin: ( - ) abnormal skin rashes Lymphatics: ( - ) new lymphadenopathy, ( - ) easy bruising Neurological: ( - ) numbness, ( - ) tingling, ( - ) new weaknesses Behavioral/Psych: ( - ) mood change, ( - ) new changes  All other systems were reviewed with the patient and are negative.  PHYSICAL EXAMINATION: ECOG PERFORMANCE STATUS: 1 - Symptomatic but completely ambulatory  Vitals:   07/01/21 0931  BP: 126/66  Pulse: 68  Resp: 18  Temp: 97.8 F (36.6 C)  SpO2: 97%    Filed Weights   07/01/21 0931  Weight: 259 lb 8 oz (117.7 kg)     GENERAL: Well-appearing elderly [place] male, alert, no distress and comfortable SKIN: skin color, texture, turgor are normal, no rashes or significant lesions EYES: conjunctiva are pink and non-injected, sclera clear NECK: supple, non-tender LYMPH:  no palpable lymphadenopathy in the cervical, axillary  or inguinal LUNGS: clear to auscultation and percussion with normal breathing effort HEART: regular rate & rhythm and no murmurs. 1+ pitting edema bilaterally in the lower extremities below the knee. PSYCH: alert & oriented x 3, fluent speech NEURO: no focal motor/sensory deficits  LABORATORY DATA:  I have reviewed  the data as listed CBC Latest Ref Rng & Units 07/01/2021 06/24/2021 06/17/2021  WBC 4.0 - 10.5 K/uL 3.6(L) 1.9(L) 3.2(L)  Hemoglobin 13.0 - 17.0 g/dL 8.9(L) 8.4(L) 8.6(L)  Hematocrit 39.0 - 52.0 % 26.8(L) 26.0(L) 25.8(L)  Platelets 150 - 400 K/uL 202 193 192    CMP Latest Ref Rng & Units 07/01/2021 06/24/2021 06/17/2021  Glucose 70 - 99 mg/dL 203(H) 210(H) 291(H)  BUN 8 - 23 mg/dL 21 19 24(H)  Creatinine 0.61 - 1.24 mg/dL 1.84(H) 1.93(H) 1.85(H)  Sodium 135 - 145 mmol/L 141 141 139  Potassium 3.5 - 5.1 mmol/L 4.3 4.3 3.9  Chloride 98 - 111 mmol/L 109 109 107  CO2 22 - 32 mmol/L _0 Calcium 8.9 - 10.3 mg/dL 8.8(L) 9.1 8.7(L)  Total Protein 6.5 - 8.1 g/dL 5.5(L) 5.5(L) 5.2(L)  Total Bilirubin 0.3 - 1.2 mg/dL 0.3 0.4 0.4  Alkaline Phos 38 - 126 U/L 77 78 67  AST 15 - 41 U/L 22 20 11(L)  ALT 0 - 44 U/L 35 27 16     RADIOGRAPHIC STUDIES: I have personally reviewed the radiological images as listed and agreed with the findings in the report: PET CT scan findings with concern for liver involvement, and lymphadenopathy that is FDG avid on both sides of the diaphragm.  Findings are most consistent with a stage III/IV diffuse large B-cell lymphoma.  NM PET Image Initial (PI) Skull Base To Thigh  Result Date: 06/04/2021 CLINICAL DATA:  Initial treatment strategy for diffuse large B-cell lymphoma. EXAM: NUCLEAR MEDICINE PET SKULL BASE TO THIGH TECHNIQUE: 13.4 mCi F-18 FDG was injected intravenously. Full-ring PET imaging was performed from the skull base to thigh after the radiotracer. CT data was obtained and used for attenuation correction and anatomic localization. Fasting blood  glucose: 185 mg/dl COMPARISON:  CT abdomen/pelvis dated 05/23/2021 FINDINGS: Mediastinal blood pool activity: SUV max 3.0 Liver activity: SUV max NA NECK: Bilateral cervical nodes, including a 9 mm short axis right posterior cervical chain node (series 4/image 36) with max SUV 3.2. Incidental CT findings: none CHEST: Bilateral axillary nodes, left greater than right, including a 14 mm short axis node on the left (series 4/image 64) with max SUV 3.3. Mediastinal lymphadenopathy, including an 18 mm short axis high right paratracheal node (series 4/image 64) with max SUV 3.6. Left IMA nodes, left greater than right, including a 1.9 cm short axis node on the left (series 4/image 103) with max SUV 5.7. Incidental CT findings: Atherosclerotic calcifications of the aortic arch. Three vessel coronary atherosclerosis. Hypodense blood pool relative to myocardium, suggesting anemia. Right arm PICC terminates at the cavoatrial junction. ABDOMEN/PELVIS: No abnormal hypermetabolism in the liver, pancreas, or adrenal glands. Splenomegaly, but without focal hypermetabolic lesion. 5.4 x 4.3 cm soft tissue lesion along the medial right lower kidney/right renal collecting system (series 4/image 149), max SUV 13.8. Indwelling right double-pigtail ureteral stent. 2.9 cm short axis jejunal mesenteric nodal mass (series 4/image 134), max SUV 4.6. Retroperitoneal lymphadenopathy, including a 1.6 x 4.0 cm lesion along the anterior aspect of the left common iliac artery (series 4/image 168), max SUV 6.2. Bilateral pelvic lymphadenopathy, including a 17 mm short axis right external iliac node (series 4/image 194), max SUV 3.1. Bilateral inguinal lymphadenopathy, measuring up to 12 mm on the right (series 4/image 29), max SUV 2.9. Incidental CT findings: Atherosclerotic calcifications the abdominal aorta and branch vessels. Prostatomegaly with a mildly thick-walled bladder. SKELETON: Right anterolateral 5th through 9th rib fracture deformities  with  associated hypermetabolism, max SUV 3.9. Heterogeneous benign focal osseous hypermetabolism, without focal lesion to suggest lymphomatous involvement on PET. Incidental CT findings: Right hip arthroplasty. IMPRESSION: Widespread lymphomatous involvement in the neck, chest, abdomen, and pelvis, as above. Index lesions include a 5.4 cm soft tissue lesion along the medial right lower kidney/renal collecting system and a 2.9 cm short axis jejunal mesentery nodal mass. Splenomegaly. Right anterolateral 5th through 9th rib fractures. Electronically Signed   By: Julian Hy M.D.   On: 06/04/2021 20:46    ASSESSMENT & PLAN [name] 75 y.o. male with medical history significant for stage III/IV diffuse large B-cell lymphoma presents for a follow up visit.    After review of the labs, review of the records, and discussion with the patient the patients findings are most consistent with advanced stage diffuse large B-cell lymphoma.  PET scan confirmed widespread disease in the neck, chest, abdomen and pelvis. Additionally, there is evidence of splenomegaly and a 5.4 cm soft tissue lesion along the right lower kidney. Patient does have a cardiac history significant for atrial fibrillation on amiodarone and therefore we may need to consider dropping anthracyclines from his regimen. The treatment consists of R-CEOP based regimen moving forward.    DLBCL IPI Score: 2 points, good prognosis. 80% progression free survival    # Diffuse Large B Cell Lymphoma, ABC subtype. Stage III/IV -- Findings at this time are most consistent with stage III/IV diffuse large B-cell lymphoma. --Treatment consists R-CEOP chemotherapy given his cardiac issues and mildly reduced EF(he is on amiodarone for atrial fibrillation) --Started Cycle 1 on 06/09/2021. --Patient returns today, 07/01/2021, for Cycle 2 Day 1 of R-CEOP. Labs from today were reviewed. WBC improved from 1.9 to 3.6, Hgb is overall stable at 8.9, Plt is  normal at 202K. ANC improved from 0.4 to 1.5.Due to neutropenia after Cycle 1, will add G-CSF on Day 4.  --Return in 3 weeks prior to Cycle 3 Day 1.    #Supportive Care -- chemotherapy education to be scheduled -- port placement to be scheduled after his PICC is removed. We will use the PICC for now.  -- zofran 42m q8H PRN and compazine 160mPO q6H for nausea -- allopurinol 30057mO daily for TLS prophylaxis -- EMLA cream for port -- no pain medication required at this time.   #Hematuria: --Likely secondary to anticoagulation (on Eliquis and Plavix), soft tissue lesion along the medial right lower kidney s/p ureteral stent placement. --Hgb is stable at 8.9. Patient will return next week to repeat CBC.  --Patient is scheduled for cystoscopy and ureteral stent exchange on 07/28/2021.  --Check with cardiologist if he can hold Plavix.   #Lower extremity edema: --Patient was on HCTZ but was discontiued when hospitalized from AKI in the setting of R hydronephrosis due to retroperitoneal adenopathy causing mass-efect.  --Creatinine is back to baseline.  --Will confirm with cardiologist if patient can resume diuretic.  No orders of the defined types were placed in this encounter.   All questions were answered. The patient knows to call the clinic with any problems, questions or concerns.  A total of more than 30 minutes were spent on this encounter with face-to-face time and non-face-to-face time, including preparing to see the patient, ordering tests and/or medications, counseling the patient and coordination of care as outlined above.   [name] [id number]/2022 10:22 PM

## 2021-07-02 ENCOUNTER — Encounter: Payer: Self-pay | Admitting: Hematology and Oncology

## 2021-07-02 ENCOUNTER — Other Ambulatory Visit: Payer: Self-pay | Admitting: *Deleted

## 2021-07-02 ENCOUNTER — Other Ambulatory Visit (HOSPITAL_COMMUNITY): Payer: Self-pay

## 2021-07-02 ENCOUNTER — Inpatient Hospital Stay: Payer: Medicare HMO

## 2021-07-02 VITALS — BP 127/60 | HR 75 | Temp 97.7°F | Resp 16

## 2021-07-02 DIAGNOSIS — C8338 Diffuse large B-cell lymphoma, lymph nodes of multiple sites: Secondary | ICD-10-CM

## 2021-07-02 DIAGNOSIS — R600 Localized edema: Secondary | ICD-10-CM | POA: Insufficient documentation

## 2021-07-02 DIAGNOSIS — Z5112 Encounter for antineoplastic immunotherapy: Secondary | ICD-10-CM | POA: Diagnosis not present

## 2021-07-02 MED ORDER — SODIUM CHLORIDE 0.9 % IV SOLN
50.0000 mg/m2 | Freq: Once | INTRAVENOUS | Status: AC
  Administered 2021-07-02: 120 mg via INTRAVENOUS
  Filled 2021-07-02: qty 6

## 2021-07-02 MED ORDER — SODIUM CHLORIDE 0.9 % IV SOLN: Freq: Once | INTRAVENOUS | Status: AC

## 2021-07-02 MED ORDER — PROCHLORPERAZINE MALEATE 10 MG PO TABS
10.0000 mg | ORAL_TABLET | Freq: Once | ORAL | Status: AC
  Administered 2021-07-02: 10 mg via ORAL
  Filled 2021-07-02: qty 1

## 2021-07-02 NOTE — Patient Instructions (Signed)
[facility]ICAL [facility]COLOG[facility]  Discharge Instructions: Thank you for choosing [facility] to provide your oncology and hematology care.   If you have a lab appointment with the Cancer Center, please go directly to the Cancer Center and check in at the registration area.   Wear comfortable clothing and clothing appropriate for easy access to any Portacath or PICC line.   We strive to give you quality time with your provider. You may need to reschedule your appointment if you arrive late (15 or more minutes).  Arriving late affects you and other patients whose appointments are after yours.  Also, if you miss three or more appointments without notifying the office, you may be dismissed from the clinic at the provider's discretion.      For prescription refill requests, have your pharmacy contact our office and allow 72 hours for refills to be completed.    Today you received the following chemotherapy and/or immunotherapy agents: etoposide.     To help prevent nausea and vomiting after your treatment, we encourage you to take your nausea medication as directed.  BELOW ARE SYMPTOMS THAT SHOULD BE REPORTED IMMEDIATELY: . *FEVER GREATER THAN 100.4 F (38 C) OR HIGHER . *CHILLS OR SWEATING . *NAUSEA AND VOMITING THAT IS NOT CONTROLLED WITH YOUR NAUSEA MEDICATION . *UNUSUAL SHORTNESS OF BREATH . *UNUSUAL BRUISING OR BLEEDING . *URINARY PROBLEMS (pain or burning when urinating, or frequent urination) . *BOWEL PROBLEMS (unusual diarrhea, constipation, pain near the anus) . TENDERNESS IN MOUTH AND THROAT WITH OR WITHOUT PRESENCE OF ULCERS (sore throat, sores in mouth, or a toothache) . UNUSUAL RASH, SWELLING OR PAIN  . UNUSUAL VAGINAL DISCHARGE OR ITCHING   Items with * indicate a potential emergency and should be followed up as soon as possible or go to the Emergency Department if any problems should occur.  Please show the CHEMOTHERAPY ALERT CARD or IMMUNOTHERAPY ALERT  CARD at check-in to the Emergency Department and triage nurse.  Should you have questions after your visit or need to cancel or reschedule your appointment, please contact [facility]  Dept: [phone]  and follow the prompts.  Office hours are 8:00 a.m. to 4:30 p.m. Monday - Friday. Please note that voicemails left after 4:00 p.m. may not be returned until the following business day.  We are closed weekends and major holidays. You have access to a nurse at all times for urgent questions. Please call the main number to the clinic Dept: [phone] and follow the prompts.   For any non-urgent questions, you may also contact your provider using MyChart. We now offer e-Visits for anyone 18 and older to request care online for non-urgent symptoms. For details visit [email or web address].   Also download the MyChart app! Go to the app store, search "MyChart", open the app, select [facility], and log in with your MyChart username and password.  Due to Covid, a mask is required upon entering the hospital/clinic. If you do not have a mask, one will be given to you upon arrival. For doctor visits, patients may have 1 support person aged 18 or older with them. For treatment visits, patients cannot have anyone with them due to current Covid guidelines and our immunocompromised population.   

## 2021-07-03 ENCOUNTER — Other Ambulatory Visit: Payer: Self-pay

## 2021-07-03 ENCOUNTER — Inpatient Hospital Stay: Payer: Medicare HMO

## 2021-07-03 VITALS — BP 146/70 | HR 72 | Temp 97.9°F | Resp 16

## 2021-07-03 DIAGNOSIS — Z5112 Encounter for antineoplastic immunotherapy: Secondary | ICD-10-CM | POA: Diagnosis not present

## 2021-07-03 DIAGNOSIS — C8338 Diffuse large B-cell lymphoma, lymph nodes of multiple sites: Secondary | ICD-10-CM

## 2021-07-03 MED ORDER — PROCHLORPERAZINE MALEATE 10 MG PO TABS
10.0000 mg | ORAL_TABLET | Freq: Once | ORAL | Status: AC
  Administered 2021-07-03: 10 mg via ORAL
  Filled 2021-07-03: qty 1

## 2021-07-03 MED ORDER — SODIUM CHLORIDE 0.9 % IV SOLN: Freq: Once | INTRAVENOUS | Status: AC

## 2021-07-03 MED ORDER — SODIUM CHLORIDE 0.9 % IV SOLN
50.0000 mg/m2 | Freq: Once | INTRAVENOUS | Status: AC
  Administered 2021-07-03: 120 mg via INTRAVENOUS
  Filled 2021-07-03: qty 6

## 2021-07-03 NOTE — Patient Instructions (Signed)
Etoposide, VP-16 injection ??? ???????????? ????? ??? ?????????? ????????, VP-16, ??? ???????????????????? ????????. ?? ??????????? ??? ??????????? ?????, ???? ?????? ? ?????? ??????????????? ????????. ??? ????????? ????? ??????????? ? ?????? ?????; ???? ? ??? ????????? ???????,?????????? ? ???????????? ????????? ??? ??????????. ???????????????? ????????? ???????? (????????): Etopophos, Toposar, VePesid ??? ??? ??????? ?????????? ???????????? ????????? ?? ?????? ?????? ??????????????? ?????????? ?????, ???? ?? ? ??? ???? ?? ????????? ?????????: ????????; ??????????? ?????; ??????????? ??????; ????? ?????????? ?????? ?????, ????? ??? ??????????, ??????????? ??? ???????????; ????????? ??? ????????????? ??????? ?? ????????; ????????? ??? ????????????? ??????? ?? ?????? ?????????; ????????? ??? ????????????? ??????? ?? ??????? ????????, ????????? ??? ???????????; ???????????? ??? ???????????? ????????????; ????????? ??????; ??? ??? ??????? ????????? ??? ?????????? ??? ????????? ????????????? ??? ????????????? ????????. ?? ???????? ? ??????????? ??????? ?????????? ????????? ??????????? ??????????. ???????? ???????? ? ??????????? ?????????? ????? ????????? ?????. ? ???? ???????????????? ?????? ????????????. ?????????????: ???? ??? ???????, ??? ?? ????????? ?????????? ???? ????? ?????????, ?????????? ?????????? ? ????????????????? ????? ??? ???????? ????????? ??? ???????? ?????????? ??????. ??????????: ??? ?????????????????????? ?????? ??? ???. ?? ???????? ?? ? ??????? ??????. ??? ?????????? ? ?????? ???????? ?????? ?????????? ????? ????????? ????????? ?????????. ???????? ????? ??? ??????? ?????????????????????, ???? ?? ?? ?????? ??????? ?? ????? ? ??????????? ?????. ? ??? ??? ????????? ????? ???????? ?? ??????????????? ??? ????????? ????? ????????????????? ?? ?????????? ???????????: ????????; ???? ???????? ?? ???????? ???? ????????? ??????????????. ???????????? ???????????? ????????? ?????? ????  ??????????? ???? ????????, ????????????? ????????, ?????????????? ?????????? ? ??????? ???????. ????? ???????? ??? ? ???????, ???????????? ??????????? ???????? ??? ??????????. ????????? ????????????? ???????? ?? ?????????????? ? ??????????? ???? ??????????. ?? ??? ????? ???????? ???????? ??? ????????????? ????? ?????????? ????????? ????????? ????? ??? ??????? ???????????? ????????? ??? ?????????? ?? ????? ??????????. ??? ????????? ????? ??????? ????????? ?????? ????????????. ??? ??????????? ???????, ????????? ??? ???????????? ?????? ?? ???????????????? ????? ?????????? ? ???????? ??????. ????????? ? ????? ???????? ????????. ??????????? ???? ??????? ???? ? ?????? ??????? ????????????, ???? ???? ?????????????? ????. ? ????????? ??????? ??????????? ?????????????? ????????? ??? ?????? ? ??????????????????. ???????? ????????? ?? ?? ??????????. ?????????? ?? ??????? ? ????? ??? ??????? ???????????? ????????? ? ?????? ????????? ???????????, ??????, ???? ? ????? ??? ?????? ????????? ???????? ??? ??????. ?? ??????????? ????????????. ??? ????????? ????? ??????? ??????????? ?????? ????????? ????????????? ?????????. ???????????? ???????? ???????? ???????????? ??????. ??? ????????? ???????????? ??????????? ????????????? ??? ????????? ??????????????. ? ?????? ?????????? ???????????? ?????????? ? ????? ??? ??????????????????? ?????????. ???????? ? ?????? ???? ???????? ? ??? ???????????????? ???????????????. ??? ?????? ????? ????????? ???????? ????????? ????? ????????? ????? ??????????????????????????????. ????????? ???????????? ?? ????? ?????? ????? ????????? ? ? ??????? ??? ??????? 6 ??????? ????? ??? ???????????. ???????? ??????? ???????? ????? ? ???????????? ???????????? ??? ????????????? ? ????????? ????????????. ????? ??????? ?????? ????? ????????? ? ????????? ? ???????????? ?????? ????????? ????? ?? ???????????? ?????? ???? ?????????????. ?????????? ??????????? ???????? ????? ????????? ?? ????. ?? ??????????????  ??????????? ?????????? ? ?????, ??????? ???????????? ????????? ??? ??????????. ??? ?????? ????? ????????? ??????????????? ????????? ??????? ??????. ???????, ??????????? ??? ?????????, ??? ??????? ????????? ? ????????? ?????? ???????????? ????????? ???????????? ?? ?????????? ????? ??????? ??????? ? ? ??????? ??? ??????? 4 ??????? ????? ??? ?????????. ????????? ??????????? ????????? ???? ? ?????? ?????????? (??????? ????????????). ? ?????? ???????????? ????????? ??????????????? ?????????? ? ?????. ?? ????? ??????? ???? ?????????? ? ?? ?????????? ??? ??????? 4 ??????? ????? ??????????? ??? ?????? ?????? ???? ??????? ??????. ???????? ??????? ???????? ????? ? ?????????????? ???????. ??? ????????? ????? ????????? ????? ??????????????. ????? ???????? ??????? ????? ???? ??? ?????? ????? ?????????? ???????? ???????, ? ??????? ??? ??????? ??? ????? ?????? ???????? ????? ?????????? ???????????? ?????????: ????????????? ???????, ????? ??? ?????? ????, ???, ??????????, ????????? ????, ??? ??? ?????; ????? ?????????? ?????? ????? - ????? ????? ????????? ????? ????????? ?????????? ??????????, ??????????? ??? ??????????? ? ?????. ????????? ????? ????????????? ? ????????????. ??????? ??? ?????; ??????????? ????, ??????????? ???????, ????????? ??? ?????? ???????????? ? ????????? ????, ? ??? ????? ????????? ?? ???; ??????????? ???????? ? ???????? ????????, ????? ??? ????????? ???????????, ?????, ??????, ???? ? ?????, ??????????? ??? ???????????? ??????????????; ??????????? ???????? ? ???????? ???????? ?????????? ??????????? ??? ??????, ????? ??? ????????? ???????? ??? ?????? ???, ?????????????? ????????? ??? ???????, ???????, ????????? ???????. ????????? ???????????? ??? ??????; ???????? ???????, ??????? ?????? ?? ??????? ???????????? ???????????? ???????? (???????? ????? ??? ??????? ???????????? ?????????, ???? ??? ???????????? ????????????? ??????????): ????????? ???????? ???????? ????; ??????; ?????????; ??????  ????????; ???? ?? ???; ???? ???????? ?? ???????? ???? ????????? ???????? ????????. ?????????? ? ????? ?? ??????? ???????????? ???????? ????????. ?? ?????? ???????? ? ???????????????? ?  FDA ?? ???????? [phone]. ??? ??????? ??????? ??? ?????????? ??? ????????? ???????? ?????? ??? ?????? ??????????? ?????????? ? ???????? ??????????. ??? ????????? ?? ???????? ??? ???????? ? ???????? ????????. ??????????: ???? ???????? ?? ???????? ???? ????????? ????????. ???? ? ??? ????????? ???????, ?????????? ??????? ?????????, ?????????? ? ?????, ????????????? ??????? ???????????? ?????????.  2022 Elsevier/Gold Standard (2020-03-06 00:00:00)

## 2021-07-04 ENCOUNTER — Inpatient Hospital Stay: Payer: Medicare HMO

## 2021-07-06 ENCOUNTER — Inpatient Hospital Stay: Payer: Medicare HMO

## 2021-07-06 ENCOUNTER — Other Ambulatory Visit: Payer: Self-pay

## 2021-07-06 ENCOUNTER — Other Ambulatory Visit: Payer: Self-pay | Admitting: Hematology and Oncology

## 2021-07-06 ENCOUNTER — Encounter: Payer: Self-pay | Admitting: Internal Medicine

## 2021-07-06 VITALS — BP 127/73 | HR 70 | Temp 98.5°F | Resp 20

## 2021-07-06 DIAGNOSIS — Z5112 Encounter for antineoplastic immunotherapy: Secondary | ICD-10-CM | POA: Diagnosis not present

## 2021-07-06 DIAGNOSIS — C8338 Diffuse large B-cell lymphoma, lymph nodes of multiple sites: Secondary | ICD-10-CM

## 2021-07-06 MED ORDER — PEGFILGRASTIM-CBQV 6 MG/0.6ML ~~LOC~~ SOSY
6.0000 mg | PREFILLED_SYRINGE | Freq: Once | SUBCUTANEOUS | Status: AC
  Administered 2021-07-06: 6 mg via SUBCUTANEOUS
  Filled 2021-07-06: qty 0.6

## 2021-07-06 NOTE — Patient Instructions (Signed)

## 2021-07-08 ENCOUNTER — Other Ambulatory Visit: Payer: Medicare HMO

## 2021-07-08 ENCOUNTER — Telehealth: Payer: Self-pay | Admitting: Pharmacist

## 2021-07-08 ENCOUNTER — Telehealth: Payer: Self-pay | Admitting: *Deleted

## 2021-07-08 NOTE — Telephone Encounter (Signed)
[name] from [facility] called regarding [name] pull PICC orders. As patient is potentially receiving chemotherapy, please advice on his PICC line since he completed IV antibiotics today. Thank you!

## 2021-07-08 NOTE — Telephone Encounter (Signed)
TCT pt's cardiologist to f/u on the message related to his anticoagulants from last week. Left vm for the cardiologist or his nurse related to pt with hematuria d/t ureteral stents and his 2 anticoagulants  Waiting for call back

## 2021-07-08 NOTE — Telephone Encounter (Signed)
Received call from pt's home health nurse. From [place]. She states his IV antibiotics have completed.  She is asking if she should pull his PICC line. Advised to leave it in for now as we have been using it for his chemo. We will evaluate it at his next visit and decide it we can switch to a port.  She voiced understanding. She also states she got labs on him on 07/06/21 and will fax those results to [place]. Pt cancelled his lab appt for today due to fever of 99.8  This was re-scheduled for 07/09/21  Dr. Lorenso Courier aware of the above.

## 2021-07-08 NOTE — Telephone Encounter (Signed)
Ok, will relay to home health - thanks!

## 2021-07-09 ENCOUNTER — Other Ambulatory Visit: Payer: Medicare HMO

## 2021-07-10 ENCOUNTER — Other Ambulatory Visit: Payer: Self-pay | Admitting: Hematology and Oncology

## 2021-07-10 ENCOUNTER — Telehealth: Payer: Self-pay | Admitting: *Deleted

## 2021-07-10 MED ORDER — OXYCODONE HCL 5 MG PO TABS: 5.0000 mg | ORAL_TABLET | ORAL | 0 refills | Status: DC | PRN

## 2021-07-10 NOTE — Telephone Encounter (Signed)
Received call from pt's daughter, Derrick Ravel. She states that pt has been experiencing increased bone pain after his pegfilgrastin injection on 07/06/21. The bone pain started on 07/08/21 in his chest radiating to his back and down his legs. It is keeping him up at night. The family gave him Norco (hydrocodone/APAP) they borrowed from another family member. Discussed with Dr. Lorenso Courier.  Advised to continue the daily claritin and also Dr. Lorenso Courier has sent in prescription for oxycodone 5 mg every 4 hours as needed for severe bone pain. TCT back to [place] and informed her of the above.  She states she will pick up new prescription .

## 2021-07-21 NOTE — Progress Notes (Signed)
[facility] [phone]   [phone]  PROGRESS NOTE  Patient Care Team: [name], MD as PCP - General (Family Medicine)  Hematological/Oncological History # Diffuse Large B Cell Lymphoma, Stage III/IV 04/30/2021: presented with RLQ abdominal pain. CT abdomen/pelvis shows pathologic mesenteric, retroperitoneal, and pelvic adenopathy, as described above. Moderate splenomegaly. Lobulated retroperitoneal soft tissue masses in the region of the right ureteropelvic junction likely resulting in mild right hydronephrosis secondary to extrinsic mass effect as well as within the right retroperitoneum 05/04/2021: needle core biopsy of right retroperitoneal lymph node shows large B cell lymphoma. 05/13/2021: establish care with Dr. Lorenso Courier 05/23/2021-06/01/2021: admitted with urosepsis. Concern for endocarditis, started on 6 week course abx via PICC line.   06/09/2021-06/11/2021: Cycle 1 of R-CEOP 07/01/2021-07/03/2021: Cycle 2 of R-CEOP 07/22/2021: Cycle 3 of R-CEOP  Interval History:  [name] 75 y.o. male with medical history significant for stage III/IV diffuse large B-cell lymphoma presents for a follow up visit. The patient's last visit was on 06/09/2021. In the interim since the last visit he completed Cycle 2 of R-CEOP.   On exam today, [name] reports reports that he feels better compared to his prior visit.  He notes he is not noticing as much blood in his urine and that has slowed down considerably.  He notes that his energy is just a little.  His appetite has been so-so mostly because the taste of food has changed.  He has had some mild unsteadiness on his feet but reports no falls.  He denies any fevers, chills, night sweats, shortness of breath, chest pain or cough. He has no other complaints.   MEDICAL HISTORY:  Past Medical History:  Diagnosis Date   Anginal pain ([place])    Coronary artery disease    Diabetes mellitus without complication  ([place])    Dysrhythmia    Easy bruising 06/18/2021   Hematuria 06/18/2021   Hypertension    Sleep apnea     SURGICAL HISTORY: Past Surgical History:  Procedure Laterality Date   CARDIAC CATHETERIZATION     CYSTOSCOPY WITH RETROGRADE PYELOGRAM, URETEROSCOPY AND STENT PLACEMENT Right 05/01/2021   Procedure: CYSTOSCOPY WITH RIGHT  RETROGRADE PYELOGRAM,  AND RIGHT STENT PLACEMENT;  Surgeon: Festus Aloe, MD;  Location: [facility];  Service: Urology;  Laterality: Right;   EYE SURGERY     INJECTION OF SILICONE OIL Left [id number]   Procedure: INJECTION OF SILICONE OIL;  Surgeon: Sherlynn Stalls, MD;  Location: [facility];  Service: Ophthalmology;  Laterality: Left;   INJECTION OF SILICONE OIL Left [id number]   Procedure: INJECTION OF SILICONE OIL;  Surgeon: Sherlynn Stalls, MD;  Location: [facility];  Service: Ophthalmology;  Laterality: Left;   JOINT REPLACEMENT Right    hip   LASER PHOTO ABLATION Left 12/25/2020   Procedure: LASER PHOTO ABLATION;  Surgeon: Sherlynn Stalls, MD;  Location: [facility];  Service: Ophthalmology;  Laterality: Left;   PARS PLANA VITRECTOMY Left 11/10/2020   Procedure: PARS PLANA VITRECTOMY WITH 25 GAUGE;  Surgeon: Sherlynn Stalls, MD;  Location: [facility];  Service: Ophthalmology;  Laterality: Left;   PARS PLANA VITRECTOMY Left 12/25/2020   Procedure: PARS PLANA VITRECTOMY WITH 25 GAUGE IN LEFT EYE;  Surgeon: Sherlynn Stalls, MD;  Location: [facility];  Service: Ophthalmology;  Laterality: Left;   PERFLUORONE INJECTION Left 12/25/2020   Procedure: PERFLUORONE INJECTION;  Surgeon: Sherlynn Stalls, MD;  Location: [facility];  Service: Ophthalmology;  Laterality: Left;   PHOTOCOAGULATION WITH LASER Left 11/10/2020   Procedure:  PHOTOCOAGULATION WITH LASER;  Surgeon: Sherlynn Stalls, MD;  Location: [facility];  Service: Ophthalmology;  Laterality: Left;   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   Procedure: RETINECTOMY LEFT EYE;  Surgeon: Sherlynn Stalls, MD;  Location: [facility];  Service:  Ophthalmology;  Laterality: Left;   SILICON OIL REMOVAL Left [id number]   Procedure: SILICONE OIL REMOVAL;  Surgeon: Sherlynn Stalls, MD;  Location: [facility];  Service: Ophthalmology;  Laterality: Left;   TEE WITHOUT CARDIOVERSION N/A 05/29/2021   Procedure: TRANSESOPHAGEAL ECHOCARDIOGRAM (TEE);  Surgeon: Werner Lean, MD;  Location: [facility];  Service: Cardiovascular;  Laterality: N/A;    SOCIAL HISTORY: Social History   Socioeconomic History   Marital status: Married    Spouse name: Not on file   Number of children: Not on file   Years of education: Not on file   Highest education level: Not on file  Occupational History   Not on file  Tobacco Use   Smoking status: Never   Smokeless tobacco: Never  Vaping Use   Vaping Use: Never used  Substance and Sexual Activity   Alcohol use: Not Currently   Drug use: Never   Sexual activity: Yes  Other Topics Concern   Not on file  Social History Narrative   Not on file   Social Determinants of Health   Financial Resource Strain: Not on file  Food Insecurity: Not on file  Transportation Needs: Not on file  Physical Activity: Not on file  Stress: Not on file  Social Connections: Not on file  Intimate Partner Violence: Not on file    FAMILY HISTORY: Family History  Problem Relation Age of Onset   CAD Other     ALLERGIES:  has No Known Allergies.  MEDICATIONS:  Current Outpatient Medications  Medication Sig Dispense Refill   acetaminophen (TYLENOL) 650 MG CR tablet Take 650 mg by mouth every 8 (eight) hours as needed for pain or fever (headache).     allopurinol (ZYLOPRIM) 300 MG tablet Take 1 tablet (300 mg total) by mouth daily. 90 tablet 1   amiodarone (PACERONE) 200 MG tablet Take 200 mg by mouth every morning.     amLODipine (NORVASC) 10 MG tablet Take 1 tablet (10 mg total) by mouth daily. 30 tablet 1   apixaban (ELIQUIS) 5 MG TABS tablet Take 5 mg by mouth 2 (two) times daily.     atorvastatin (LIPITOR) 20  MG tablet Take 20 mg by mouth at bedtime.     clopidogrel (PLAVIX) 75 MG tablet Take 75 mg by mouth every morning.     finasteride (PROSCAR) 5 MG tablet Take 5 mg by mouth at bedtime.     furosemide (LASIX) 20 MG tablet Take 1 tablet (20 mg total) by mouth daily. 30 tablet 1   glipiZIDE (GLUCOTROL) 10 MG tablet Take 10 mg by mouth 2 (two) times daily.     insulin glargine (LANTUS) 100 unit/mL SOPN Inject 34 Units into the skin at bedtime.     isosorbide mononitrate (IMDUR) 30 MG 24 hr tablet Take 30 mg by mouth every morning.     metFORMIN (GLUCOPHAGE) 1000 MG tablet Take 1,000 mg by mouth 2 (two) times daily.     metoprolol tartrate (LOPRESSOR) 50 MG tablet Take 1 tablet (50 mg total) by mouth 2 (two) times daily. 60 tablet 1   nitroGLYCERIN (NITROSTAT) 0.3 MG SL tablet Place 0.3 mg under the tongue every 5 (five) minutes x 3 doses as needed for chest  pain.     oxyCODONE (OXY IR/ROXICODONE) 5 MG immediate release tablet Take 1 tablet (5 mg total) by mouth every 4 (four) hours as needed for severe pain. 20 tablet 0   predniSONE (DELTASONE) 20 MG tablet Take 3 tablets (60 mg total) by mouth daily with breakfast. Take 3 pills per day starting on Day 1 of chemotherapy, for 5 total days. 15 tablet 5   prochlorperazine (COMPAZINE) 10 MG tablet Take 1 tablet (10 mg total) by mouth every 6 (six) hours as needed for nausea or vomiting. 30 tablet 0   tenofovir (VIREAD) 300 MG tablet Take 1 tablet (300 mg total) by mouth every other day. 15 tablet 11   No current facility-administered medications for this visit.   Facility-Administered Medications Ordered in Other Visits  Medication Dose Route Frequency Provider Last Rate Last Admin   heparin lock flush 100 unit/mL  250 Units Intracatheter PRN Ledell Peoples IV, MD       sodium chloride flush (NS) 0.9 % injection 10 mL  10 mL Intracatheter PRN Ledell Peoples IV, MD       sodium chloride flush (NS) 0.9 % injection 10 mL  10 mL Intravenous Once Ledell Peoples IV, MD       sodium chloride flush (NS) 0.9 % injection 10 mL  10 mL Intracatheter PRN Orson Slick, MD   10 mL at 07/22/21 1522    REVIEW OF SYSTEMS:   Constitutional: ( - ) fevers, ( - )  chills , ( - ) night sweats Eyes: ( - ) blurriness of vision, ( - ) double vision, ( - ) watery eyes Ears, nose, mouth, throat, and face: ( - ) mucositis, ( - ) sore throat Respiratory: ( - ) cough, ( - ) dyspnea, ( - ) wheezes Cardiovascular: ( - ) palpitation, ( - ) chest discomfort, ( +) lower extremity swelling Gastrointestinal:  ( - ) nausea, ( - ) heartburn, ( - ) change in bowel habits Skin: ( - ) abnormal skin rashes Lymphatics: ( - ) new lymphadenopathy, ( - ) easy bruising Neurological: ( - ) numbness, ( - ) tingling, ( - ) new weaknesses Behavioral/Psych: ( - ) mood change, ( - ) new changes  All other systems were reviewed with the patient and are negative.  PHYSICAL EXAMINATION: ECOG PERFORMANCE STATUS: 1 - Symptomatic but completely ambulatory  Vitals:   07/22/21 0901  BP: (!) 127/56  Pulse: 73  Resp: 18  Temp: 97.8 F (36.6 C)  SpO2: 98%     Filed Weights   07/22/21 0901  Weight: 252 lb 4.8 oz (114.4 kg)    GENERAL: Well-appearing elderly [place] male, alert, no distress and comfortable SKIN: skin color, texture, turgor are normal, no rashes or significant lesions EYES: conjunctiva are pink and non-injected, sclera clear NECK: supple, non-tender LYMPH:  no palpable lymphadenopathy in the cervical, axillary or inguinal LUNGS: clear to auscultation and percussion with normal breathing effort HEART: regular rate & rhythm and no murmurs. 1+ pitting edema bilaterally in the lower extremities below the knee. PSYCH: alert & oriented x 3, fluent speech NEURO: no focal motor/sensory deficits  LABORATORY DATA:  I have reviewed the data as listed CBC Latest Ref Rng & Units 07/22/2021 07/01/2021 06/24/2021  WBC 4.0 - 10.5 K/uL 10.3 3.6(L) 1.9(L)  Hemoglobin 13.0 - 17.0  g/dL 7.9(L) 8.9(L) 8.4(L)  Hematocrit 39.0 - 52.0 % 24.1(L) 26.8(L) 26.0(L)  Platelets 150 - 400 K/uL 227  202 193    CMP Latest Ref Rng & Units 07/22/2021 07/01/2021 06/24/2021  Glucose 70 - 99 mg/dL 261(H) 203(H) 210(H)  BUN 8 - 23 mg/dL '19 21 19  '$ Creatinine 0.61 - 1.24 mg/dL 1.92(H) 1.84(H) 1.93(H)  Sodium 135 - 145 mmol/L 141 141 141  Potassium 3.5 - 5.1 mmol/L 4.7 4.3 4.3  Chloride 98 - 111 mmol/L 108 109 109  CO2 22 - 32 mmol/L '22 23 23  '$ Calcium 8.9 - 10.3 mg/dL 8.7(L) 8.8(L) 9.1  Total Protein 6.5 - 8.1 g/dL 5.2(L) 5.5(L) 5.5(L)  Total Bilirubin 0.3 - 1.2 mg/dL 0.4 0.3 0.4  Alkaline Phos 38 - 126 U/L 98 77 78  AST 15 - 41 U/L 43(H) 22 20  ALT 0 - 44 U/L 76(H) 35 27     RADIOGRAPHIC STUDIES: I have personally reviewed the radiological images as listed and agreed with the findings in the report: PET CT scan findings with concern for liver involvement, and lymphadenopathy that is FDG avid on both sides of the diaphragm.  Findings are most consistent with a stage III/IV diffuse large B-cell lymphoma.  No results found.  ASSESSMENT & PLAN [name] 75 y.o. male with medical history significant for stage III/IV diffuse large B-cell lymphoma presents for a follow up visit.    After review of the labs, review of the records, and discussion with the patient the patients findings are most consistent with advanced stage diffuse large B-cell lymphoma.  PET scan confirmed widespread disease in the neck, chest, abdomen and pelvis. Additionally, there is evidence of splenomegaly and a 5.4 cm soft tissue lesion along the right lower kidney. Patient does have a cardiac history significant for atrial fibrillation on amiodarone and therefore we may need to consider dropping anthracyclines from his regimen. The treatment consists of R-CEOP based regimen moving forward.    DLBCL IPI Score: 2 points, good prognosis. 80% progression free survival    # Diffuse Large B Cell Lymphoma, ABC subtype.  Stage III/IV -- Findings at this time are most consistent with stage III/IV diffuse large B-cell lymphoma. --Treatment consists R-CEOP chemotherapy given his cardiac issues and mildly reduced EF(he is on amiodarone for atrial fibrillation) --Started Cycle 1 on 06/09/2021 --Due to neutropenia after Cycle 1, added G-CSF on Day 4.  Plan: --today is Cycle 3 Day 1 --patient Hgb is 7.9, will arrange for transfusion later this week.  --Return in 3 weeks for Cycle 4 Day 1.   #Chemotherapy Induced Anemia --Hgb 7.9, down from prior 8.9 --will proceed with treatment today, plan for blood transfusion --likely drop worsened due to hematuria as noted above --continue to monitor   #Supportive Care -- chemotherapy education complete -- port placement to be scheduled after his PICC is removed. We will use the PICC for now.  -- zofran '8mg'$  q8H PRN and compazine '10mg'$  PO q6H for nausea -- allopurinol '300mg'$  PO daily for TLS prophylaxis -- EMLA cream for port -- no pain medication required at this time.   #Hematuria, improving --Likely secondary to anticoagulation (on Eliquis and Plavix), soft tissue lesion along the medial right lower kidney s/p ureteral stent placement. --Hgb is stable at 7.9. Patient will return next week to repeat CBC.  --Patient is scheduled for cystoscopy and ureteral stent exchange on 07/28/2021.  --patient holding Plavix, continuing eliquis.   #Lower extremity edema: --Patient was on HCTZ but was discontiued when hospitalized from AKI in the setting of R hydronephrosis due to retroperitoneal adenopathy causing mass-efect.  --Creatinine  is back to baseline.  --continue lasix therapy as prescribed.   No orders of the defined types were placed in this encounter.   All questions were answered. The patient knows to call the clinic with any problems, questions or concerns.  A total of more than 30 minutes were spent on this encounter with face-to-face time and non-face-to-face time,  including preparing to see the patient, ordering tests and/or medications, counseling the patient and coordination of care as outlined above.   Ledell Peoples, MD Department of Hematology/Oncology [place] at [facility] Phone: [phone] Pager: [phone] Email: [email or web address]   07/22/2021 3:57 PM

## 2021-07-22 ENCOUNTER — Inpatient Hospital Stay: Payer: Medicare HMO

## 2021-07-22 ENCOUNTER — Encounter: Payer: Self-pay | Admitting: Hematology and Oncology

## 2021-07-22 ENCOUNTER — Other Ambulatory Visit: Payer: Self-pay

## 2021-07-22 ENCOUNTER — Inpatient Hospital Stay: Payer: Medicare HMO | Attending: Hematology and Oncology

## 2021-07-22 ENCOUNTER — Other Ambulatory Visit: Payer: Self-pay | Admitting: *Deleted

## 2021-07-22 ENCOUNTER — Inpatient Hospital Stay (HOSPITAL_BASED_OUTPATIENT_CLINIC_OR_DEPARTMENT_OTHER): Payer: Medicare HMO | Admitting: Hematology and Oncology

## 2021-07-22 ENCOUNTER — Ambulatory Visit: Payer: Medicare HMO

## 2021-07-22 VITALS — BP 127/56 | HR 73 | Temp 97.8°F | Resp 18 | Wt 252.3 lb

## 2021-07-22 VITALS — BP 131/65 | HR 70 | Temp 97.7°F | Resp 17

## 2021-07-22 DIAGNOSIS — Z7901 Long term (current) use of anticoagulants: Secondary | ICD-10-CM | POA: Insufficient documentation

## 2021-07-22 DIAGNOSIS — C8338 Diffuse large B-cell lymphoma, lymph nodes of multiple sites: Secondary | ICD-10-CM

## 2021-07-22 DIAGNOSIS — D6481 Anemia due to antineoplastic chemotherapy: Secondary | ICD-10-CM | POA: Diagnosis not present

## 2021-07-22 DIAGNOSIS — R310 Gross hematuria: Secondary | ICD-10-CM

## 2021-07-22 DIAGNOSIS — R319 Hematuria, unspecified: Secondary | ICD-10-CM | POA: Diagnosis not present

## 2021-07-22 DIAGNOSIS — Z5112 Encounter for antineoplastic immunotherapy: Secondary | ICD-10-CM | POA: Insufficient documentation

## 2021-07-22 DIAGNOSIS — I129 Hypertensive chronic kidney disease with stage 1 through stage 4 chronic kidney disease, or unspecified chronic kidney disease: Secondary | ICD-10-CM | POA: Diagnosis not present

## 2021-07-22 DIAGNOSIS — T451X5A Adverse effect of antineoplastic and immunosuppressive drugs, initial encounter: Secondary | ICD-10-CM | POA: Insufficient documentation

## 2021-07-22 DIAGNOSIS — E1122 Type 2 diabetes mellitus with diabetic chronic kidney disease: Secondary | ICD-10-CM | POA: Diagnosis not present

## 2021-07-22 DIAGNOSIS — K5900 Constipation, unspecified: Secondary | ICD-10-CM | POA: Insufficient documentation

## 2021-07-22 DIAGNOSIS — R600 Localized edema: Secondary | ICD-10-CM | POA: Insufficient documentation

## 2021-07-22 DIAGNOSIS — Z5189 Encounter for other specified aftercare: Secondary | ICD-10-CM | POA: Diagnosis not present

## 2021-07-22 DIAGNOSIS — G4733 Obstructive sleep apnea (adult) (pediatric): Secondary | ICD-10-CM | POA: Insufficient documentation

## 2021-07-22 DIAGNOSIS — Z5111 Encounter for antineoplastic chemotherapy: Secondary | ICD-10-CM | POA: Diagnosis present

## 2021-07-22 DIAGNOSIS — C8333 Diffuse large B-cell lymphoma, intra-abdominal lymph nodes: Secondary | ICD-10-CM | POA: Diagnosis present

## 2021-07-22 DIAGNOSIS — I4891 Unspecified atrial fibrillation: Secondary | ICD-10-CM | POA: Diagnosis not present

## 2021-07-22 DIAGNOSIS — Z452 Encounter for adjustment and management of vascular access device: Secondary | ICD-10-CM

## 2021-07-22 DIAGNOSIS — D649 Anemia, unspecified: Secondary | ICD-10-CM

## 2021-07-22 LAB — CBC WITH DIFFERENTIAL (CANCER CENTER ONLY)
Abs Immature Granulocytes: 0.16 10*3/uL — ABNORMAL HIGH (ref 0.00–0.07)
Basophils Absolute: 0.1 10*3/uL (ref 0.0–0.1)
Basophils Relative: 1 %
Eosinophils Absolute: 0.1 10*3/uL (ref 0.0–0.5)
Eosinophils Relative: 1 %
HCT: 24.1 % — ABNORMAL LOW (ref 39.0–52.0)
Hemoglobin: 7.9 g/dL — ABNORMAL LOW (ref 13.0–17.0)
Immature Granulocytes: 2 %
Lymphocytes Relative: 48 %
Lymphs Abs: 5.1 10*3/uL — ABNORMAL HIGH (ref 0.7–4.0)
MCH: 26.1 pg (ref 26.0–34.0)
MCHC: 32.8 g/dL (ref 30.0–36.0)
MCV: 79.5 fL — ABNORMAL LOW (ref 80.0–100.0)
Monocytes Absolute: 0.8 10*3/uL (ref 0.1–1.0)
Monocytes Relative: 8 %
Neutro Abs: 4.1 10*3/uL (ref 1.7–7.7)
Neutrophils Relative %: 40 %
Platelet Count: 227 10*3/uL (ref 150–400)
RBC: 3.03 MIL/uL — ABNORMAL LOW (ref 4.22–5.81)
RDW: 18.1 % — ABNORMAL HIGH (ref 11.5–15.5)
WBC Count: 10.3 10*3/uL (ref 4.0–10.5)
nRBC: 0 % (ref 0.0–0.2)

## 2021-07-22 LAB — CMP (CANCER CENTER ONLY)
ALT: 76 U/L — ABNORMAL HIGH (ref 0–44)
AST: 43 U/L — ABNORMAL HIGH (ref 15–41)
Albumin: 3 g/dL — ABNORMAL LOW (ref 3.5–5.0)
Alkaline Phosphatase: 98 U/L (ref 38–126)
Anion gap: 11 (ref 5–15)
BUN: 19 mg/dL (ref 8–23)
CO2: 22 mmol/L (ref 22–32)
Calcium: 8.7 mg/dL — ABNORMAL LOW (ref 8.9–10.3)
Chloride: 108 mmol/L (ref 98–111)
Creatinine: 1.92 mg/dL — ABNORMAL HIGH (ref 0.61–1.24)
GFR, Estimated: 36 mL/min — ABNORMAL LOW (ref >60)
Glucose, Bld: 261 mg/dL — ABNORMAL HIGH (ref 70–99)
Potassium: 4.7 mmol/L (ref 3.5–5.1)
Sodium: 141 mmol/L (ref 135–145)
Total Bilirubin: 0.4 mg/dL (ref 0.3–1.2)
Total Protein: 5.2 g/dL — ABNORMAL LOW (ref 6.5–8.1)

## 2021-07-22 LAB — LACTATE DEHYDROGENASE: LDH: 292 U/L — ABNORMAL HIGH (ref 98–192)

## 2021-07-22 LAB — SAMPLE TO BLOOD BANK

## 2021-07-22 LAB — ABO/RH: ABO/RH(D): O POS

## 2021-07-22 LAB — URIC ACID: Uric Acid, Serum: 4 mg/dL (ref 3.7–8.6)

## 2021-07-22 MED ORDER — VINCRISTINE SULFATE CHEMO INJECTION 1 MG/ML
2.0000 mg | Freq: Once | INTRAVENOUS | Status: AC
  Administered 2021-07-22: 2 mg via INTRAVENOUS
  Filled 2021-07-22: qty 2

## 2021-07-22 MED ORDER — SODIUM CHLORIDE 0.9% FLUSH
10.0000 mL | INTRAVENOUS | Status: DC | PRN
  Administered 2021-07-22: 10 mL

## 2021-07-22 MED ORDER — PALONOSETRON HCL INJECTION 0.25 MG/5ML
0.2500 mg | Freq: Once | INTRAVENOUS | Status: AC
  Administered 2021-07-22: 0.25 mg via INTRAVENOUS
  Filled 2021-07-22: qty 5

## 2021-07-22 MED ORDER — SODIUM CHLORIDE 0.9% FLUSH
10.0000 mL | INTRAVENOUS | Status: AC | PRN
  Administered 2021-07-22: 10 mL

## 2021-07-22 MED ORDER — FUROSEMIDE 20 MG PO TABS: 20.0000 mg | ORAL_TABLET | Freq: Every day | ORAL | 1 refills | Status: DC

## 2021-07-22 MED ORDER — HEPARIN SOD (PORK) LOCK FLUSH 100 UNIT/ML IV SOLN
500.0000 [IU] | Freq: Once | INTRAVENOUS | Status: AC | PRN
  Administered 2021-07-22: 500 [IU]

## 2021-07-22 MED ORDER — PREDNISONE 20 MG PO TABS: 60.0000 mg | ORAL_TABLET | Freq: Every day | ORAL | 5 refills | Status: DC

## 2021-07-22 MED ORDER — SODIUM CHLORIDE 0.9 % IV SOLN
10.0000 mg | Freq: Once | INTRAVENOUS | Status: AC
  Administered 2021-07-22: 10 mg via INTRAVENOUS
  Filled 2021-07-22: qty 10

## 2021-07-22 MED ORDER — SODIUM CHLORIDE 0.9 % IV SOLN
375.0000 mg/m2 | Freq: Once | INTRAVENOUS | Status: AC
  Administered 2021-07-22: 900 mg via INTRAVENOUS
  Filled 2021-07-22: qty 50

## 2021-07-22 MED ORDER — PREDNISONE 50 MG PO TABS: 100.0000 mg | ORAL_TABLET | Freq: Once | ORAL | Status: DC

## 2021-07-22 MED ORDER — ACETAMINOPHEN 325 MG PO TABS
650.0000 mg | ORAL_TABLET | Freq: Once | ORAL | Status: AC
  Administered 2021-07-22: 650 mg via ORAL
  Filled 2021-07-22: qty 2

## 2021-07-22 MED ORDER — CYCLOPHOSPHAMIDE CHEMO INJECTION 1 GM
750.0000 mg/m2 | Freq: Once | INTRAMUSCULAR | Status: AC
  Administered 2021-07-22: 1740 mg via INTRAVENOUS
  Filled 2021-07-22: qty 87

## 2021-07-22 MED ORDER — SODIUM CHLORIDE 0.9 % IV SOLN: Freq: Once | INTRAVENOUS | Status: AC

## 2021-07-22 MED ORDER — DIPHENHYDRAMINE HCL 25 MG PO CAPS
50.0000 mg | ORAL_CAPSULE | Freq: Once | ORAL | Status: AC
  Administered 2021-07-22: 50 mg via ORAL
  Filled 2021-07-22: qty 2

## 2021-07-22 MED ORDER — AMLODIPINE BESYLATE 10 MG PO TABS: 10.0000 mg | ORAL_TABLET | Freq: Every day | ORAL | 1 refills | Status: DC

## 2021-07-22 MED ORDER — SODIUM CHLORIDE 0.9 % IV SOLN
50.0000 mg/m2 | Freq: Once | INTRAVENOUS | Status: AC
  Administered 2021-07-22: 120 mg via INTRAVENOUS
  Filled 2021-07-22: qty 6

## 2021-07-22 MED ORDER — PREDNISONE 20 MG PO TABS
60.0000 mg | ORAL_TABLET | Freq: Once | ORAL | Status: AC
Start: 2021-07-22 — End: 2021-07-22
  Administered 2021-07-22: 60 mg via ORAL

## 2021-07-22 MED ORDER — PREDNISONE 20 MG PO TABS: 60.0000 mg | ORAL_TABLET | Freq: Once | ORAL | Status: DC

## 2021-07-22 NOTE — Patient Instructions (Signed)
[facility]  Discharge Instructions: Thank you for choosing [facility] to provide your oncology and hematology care.   If you have a lab appointment with the [facility], please go directly to the [facility] and check in at the registration area.   Wear comfortable clothing and clothing appropriate for easy access to any Portacath or PICC line.   We strive to give you quality time with your provider. You may need to reschedule your appointment if you arrive late (15 or more minutes).  Arriving late affects you and other patients whose appointments are after yours.  Also, if you miss three or more appointments without notifying the office, you may be dismissed from the clinic at the provider's discretion.      For prescription refill requests, have your pharmacy contact our office and allow 72 hours for refills to be completed.    Today you received the following chemotherapy and/or immunotherapy agents: Oncovin, Cytoxan, Vepesid, & Rituximab   To help prevent nausea and vomiting after your treatment, we encourage you to take your nausea medication as directed.  BELOW ARE SYMPTOMS THAT SHOULD BE REPORTED IMMEDIATELY: *FEVER GREATER THAN 100.4 F (38 C) OR HIGHER *CHILLS OR SWEATING *NAUSEA AND VOMITING THAT IS NOT CONTROLLED WITH YOUR NAUSEA MEDICATION *UNUSUAL SHORTNESS OF BREATH *UNUSUAL BRUISING OR BLEEDING *URINARY PROBLEMS (pain or burning when urinating, or frequent urination) *BOWEL PROBLEMS (unusual diarrhea, constipation, pain near the anus) TENDERNESS IN MOUTH AND THROAT WITH OR WITHOUT PRESENCE OF ULCERS (sore throat, sores in mouth, or a toothache) UNUSUAL RASH, SWELLING OR PAIN  UNUSUAL VAGINAL DISCHARGE OR ITCHING   Items with * indicate a potential emergency and should be followed up as soon as possible or go to the Emergency Department if any problems should occur.  Please show the CHEMOTHERAPY ALERT CARD or IMMUNOTHERAPY  ALERT CARD at check-in to the Emergency Department and triage nurse.  Should you have questions after your visit or need to cancel or reschedule your appointment, please contact [facility]  Dept: [phone]  and follow the prompts.  Office hours are 8:00 a.m. to 4:30 p.m. Monday - Friday. Please note that voicemails left after 4:00 p.m. may not be returned until the following business day.  We are closed weekends and major holidays. You have access to a nurse at all times for urgent questions. Please call the main number to the clinic Dept: [phone] and follow the prompts.   For any non-urgent questions, you may also contact your provider using MyChart. We now offer e-Visits for anyone 8 and older to request care online for non-urgent symptoms. For details visit [email or web address].   Also download the MyChart app! Go to the app store, search "MyChart", open the app, select [facility], and log in with your MyChart username and password.  Due to Covid, a mask is required upon entering the hospital/clinic. If you do not have a mask, one will be given to you upon arrival. For doctor visits, patients may have 1 support person aged 79 or older with them. For treatment visits, patients cannot have anyone with them due to current Covid guidelines and our immunocompromised population.

## 2021-07-22 NOTE — Progress Notes (Signed)
OK to great with HGB of 7.9 and Creatinine of 1.9

## 2021-07-22 NOTE — Progress Notes (Signed)
Pt presented with wound on RLE from home, MD aware. This RN cleaned and dressed wound while educating pt to do the same at home. RN gave supplies to pt, pt verbalized understanding.

## 2021-07-23 ENCOUNTER — Inpatient Hospital Stay: Payer: Medicare HMO

## 2021-07-23 ENCOUNTER — Telehealth: Payer: Self-pay | Admitting: *Deleted

## 2021-07-23 VITALS — BP 121/53 | HR 71 | Temp 97.7°F | Resp 16 | Wt 251.0 lb

## 2021-07-23 DIAGNOSIS — C8338 Diffuse large B-cell lymphoma, lymph nodes of multiple sites: Secondary | ICD-10-CM

## 2021-07-23 DIAGNOSIS — D649 Anemia, unspecified: Secondary | ICD-10-CM

## 2021-07-23 DIAGNOSIS — Z5112 Encounter for antineoplastic immunotherapy: Secondary | ICD-10-CM | POA: Diagnosis not present

## 2021-07-23 LAB — PREPARE RBC (CROSSMATCH)

## 2021-07-23 MED ORDER — SODIUM CHLORIDE 0.9 % IV SOLN: Freq: Once | INTRAVENOUS | Status: AC

## 2021-07-23 MED ORDER — HEPARIN SOD (PORK) LOCK FLUSH 100 UNIT/ML IV SOLN: 500.0000 [IU] | Freq: Once | INTRAVENOUS | Status: DC | PRN

## 2021-07-23 MED ORDER — SODIUM CHLORIDE 0.9 % IV SOLN
50.0000 mg/m2 | Freq: Once | INTRAVENOUS | Status: AC
  Administered 2021-07-23: 120 mg via INTRAVENOUS
  Filled 2021-07-23: qty 6

## 2021-07-23 MED ORDER — SODIUM CHLORIDE 0.9% FLUSH: 10.0000 mL | INTRAVENOUS | Status: DC | PRN

## 2021-07-23 MED ORDER — PROCHLORPERAZINE MALEATE 10 MG PO TABS
10.0000 mg | ORAL_TABLET | Freq: Once | ORAL | Status: AC
  Administered 2021-07-23: 10 mg via ORAL
  Filled 2021-07-23: qty 1

## 2021-07-23 MED ORDER — SODIUM CHLORIDE 0.9% IV SOLUTION
250.0000 mL | Freq: Once | INTRAVENOUS | Status: AC
Start: 2021-07-23 — End: 2021-07-23
  Administered 2021-07-23: 250 mL via INTRAVENOUS

## 2021-07-23 MED ORDER — ACETAMINOPHEN 325 MG PO TABS
650.0000 mg | ORAL_TABLET | Freq: Once | ORAL | Status: AC
  Administered 2021-07-23: 650 mg via ORAL
  Filled 2021-07-23: qty 2

## 2021-07-23 NOTE — Patient Instructions (Signed)
[facility]  Discharge Instructions: Thank you for choosing [facility] to provide your oncology and hematology care.   If you have a lab appointment with the Cancer Center, please go directly to the Cancer Center and check in at the registration area.   Wear comfortable clothing and clothing appropriate for easy access to any Portacath or PICC line.   We strive to give you quality time with your provider. You may need to reschedule your appointment if you arrive late (15 or more minutes).  Arriving late affects you and other patients whose appointments are after yours.  Also, if you miss three or more appointments without notifying the office, you may be dismissed from the clinic at the provider's discretion.      For prescription refill requests, have your pharmacy contact our office and allow 72 hours for refills to be completed.    Today you received the following chemotherapy and/or immunotherapy agents: etoposide.     To help prevent nausea and vomiting after your treatment, we encourage you to take your nausea medication as directed.  BELOW ARE SYMPTOMS THAT SHOULD BE REPORTED IMMEDIATELY: . *FEVER GREATER THAN 100.4 F (38 C) OR HIGHER . *CHILLS OR SWEATING . *NAUSEA AND VOMITING THAT IS NOT CONTROLLED WITH YOUR NAUSEA MEDICATION . *UNUSUAL SHORTNESS OF BREATH . *UNUSUAL BRUISING OR BLEEDING . *URINARY PROBLEMS (pain or burning when urinating, or frequent urination) . *BOWEL PROBLEMS (unusual diarrhea, constipation, pain near the anus) . TENDERNESS IN MOUTH AND THROAT WITH OR WITHOUT PRESENCE OF ULCERS (sore throat, sores in mouth, or a toothache) . UNUSUAL RASH, SWELLING OR PAIN  . UNUSUAL VAGINAL DISCHARGE OR ITCHING   Items with * indicate a potential emergency and should be followed up as soon as possible or go to the Emergency Department if any problems should occur.  Please show the CHEMOTHERAPY ALERT CARD or IMMUNOTHERAPY ALERT  CARD at check-in to the Emergency Department and triage nurse.  Should you have questions after your visit or need to cancel or reschedule your appointment, please contact [facility]  Dept: [phone]  and follow the prompts.  Office hours are 8:00 a.m. to 4:30 p.m. Monday - Friday. Please note that voicemails left after 4:00 p.m. may not be returned until the following business day.  We are closed weekends and major holidays. You have access to a nurse at all times for urgent questions. Please call the main number to the clinic Dept: [phone] and follow the prompts.   For any non-urgent questions, you may also contact your provider using MyChart. We now offer e-Visits for anyone 18 and older to request care online for non-urgent symptoms. For details visit [email or web address].   Also download the MyChart app! Go to the app store, search "MyChart", open the app, select [facility], and log in with your MyChart username and password.  Due to Covid, a mask is required upon entering the hospital/clinic. If you do not have a mask, one will be given to you upon arrival. For doctor visits, patients may have 1 support person aged 18 or older with them. For treatment visits, patients cannot have anyone with them due to current Covid guidelines and our immunocompromised population.   

## 2021-07-23 NOTE — Telephone Encounter (Signed)
Call made to Transportation dept. As pt has transportation needs. He needs to come in weekly for PICC line dressing changes and does not drive nor does his wife. Provided information and contact information of Liana Crocker daughter, who speaks [place]. Pt and his wife speak only [place].

## 2021-07-23 NOTE — Progress Notes (Signed)
Pt. states he took his morning dose of decadron.

## 2021-07-24 ENCOUNTER — Other Ambulatory Visit: Payer: Self-pay

## 2021-07-24 ENCOUNTER — Encounter (HOSPITAL_BASED_OUTPATIENT_CLINIC_OR_DEPARTMENT_OTHER): Payer: Self-pay | Admitting: Urology

## 2021-07-24 ENCOUNTER — Inpatient Hospital Stay: Payer: Medicare HMO

## 2021-07-24 VITALS — BP 121/63 | HR 70 | Temp 97.6°F | Resp 20

## 2021-07-24 DIAGNOSIS — C8338 Diffuse large B-cell lymphoma, lymph nodes of multiple sites: Secondary | ICD-10-CM

## 2021-07-24 DIAGNOSIS — Z5112 Encounter for antineoplastic immunotherapy: Secondary | ICD-10-CM | POA: Diagnosis not present

## 2021-07-24 DIAGNOSIS — Z9989 Dependence on other enabling machines and devices: Secondary | ICD-10-CM

## 2021-07-24 HISTORY — DX: Dependence on other enabling machines and devices: Z99.89

## 2021-07-24 LAB — TYPE AND SCREEN
ABO/RH(D): O POS
Antibody Screen: NEGATIVE
Unit division: 0

## 2021-07-24 LAB — BPAM RBC
Blood Product Expiration Date: 202210122359
ISSUE DATE / TIME: 202209081041
Unit Type and Rh: 5100

## 2021-07-24 MED ORDER — SODIUM CHLORIDE 0.9 % IV SOLN
50.0000 mg/m2 | Freq: Once | INTRAVENOUS | Status: AC
  Administered 2021-07-24: 120 mg via INTRAVENOUS
  Filled 2021-07-24: qty 6

## 2021-07-24 MED ORDER — PROCHLORPERAZINE MALEATE 10 MG PO TABS
ORAL_TABLET | ORAL | Status: AC
  Filled 2021-07-24: qty 1

## 2021-07-24 MED ORDER — SODIUM CHLORIDE 0.9% FLUSH
10.0000 mL | INTRAVENOUS | Status: DC | PRN
  Administered 2021-07-24: 10 mL

## 2021-07-24 MED ORDER — PROCHLORPERAZINE MALEATE 10 MG PO TABS
10.0000 mg | ORAL_TABLET | Freq: Once | ORAL | Status: AC
  Administered 2021-07-24: 10 mg via ORAL

## 2021-07-24 MED ORDER — SODIUM CHLORIDE 0.9 % IV SOLN: Freq: Once | INTRAVENOUS | Status: AC

## 2021-07-24 MED ORDER — HEPARIN SOD (PORK) LOCK FLUSH 100 UNIT/ML IV SOLN
500.0000 [IU] | Freq: Once | INTRAVENOUS | Status: AC | PRN
  Administered 2021-07-24: 250 [IU]

## 2021-07-24 NOTE — Patient Instructions (Signed)
[facility]  Discharge Instructions: Thank you for choosing [facility] to provide your oncology and hematology care.   If you have a lab appointment with the Cancer Center, please go directly to the Cancer Center and check in at the registration area.   Wear comfortable clothing and clothing appropriate for easy access to any Portacath or PICC line.   We strive to give you quality time with your provider. You may need to reschedule your appointment if you arrive late (15 or more minutes).  Arriving late affects you and other patients whose appointments are after yours.  Also, if you miss three or more appointments without notifying the office, you may be dismissed from the clinic at the provider's discretion.      For prescription refill requests, have your pharmacy contact our office and allow 72 hours for refills to be completed.    Today you received the following chemotherapy and/or immunotherapy agent: Etoposide   To help prevent nausea and vomiting after your treatment, we encourage you to take your nausea medication as directed.  BELOW ARE SYMPTOMS THAT SHOULD BE REPORTED IMMEDIATELY: . *FEVER GREATER THAN 100.4 F (38 C) OR HIGHER . *CHILLS OR SWEATING . *NAUSEA AND VOMITING THAT IS NOT CONTROLLED WITH YOUR NAUSEA MEDICATION . *UNUSUAL SHORTNESS OF BREATH . *UNUSUAL BRUISING OR BLEEDING . *URINARY PROBLEMS (pain or burning when urinating, or frequent urination) . *BOWEL PROBLEMS (unusual diarrhea, constipation, pain near the anus) . TENDERNESS IN MOUTH AND THROAT WITH OR WITHOUT PRESENCE OF ULCERS (sore throat, sores in mouth, or a toothache) . UNUSUAL RASH, SWELLING OR PAIN  . UNUSUAL VAGINAL DISCHARGE OR ITCHING   Items with * indicate a potential emergency and should be followed up as soon as possible or go to the Emergency Department if any problems should occur.  Please show the CHEMOTHERAPY ALERT CARD or IMMUNOTHERAPY ALERT CARD  at check-in to the Emergency Department and triage nurse.  Should you have questions after your visit or need to cancel or reschedule your appointment, please contact [facility]  Dept: [phone]  and follow the prompts.  Office hours are 8:00 a.m. to 4:30 p.m. Monday - Friday. Please note that voicemails left after 4:00 p.m. may not be returned until the following business day.  We are closed weekends and major holidays. You have access to a nurse at all times for urgent questions. Please call the main number to the clinic Dept: [phone] and follow the prompts.   For any non-urgent questions, you may also contact your provider using MyChart. We now offer e-Visits for anyone 18 and older to request care online for non-urgent symptoms. For details visit [email or web address].   Also download the MyChart app! Go to the app store, search "MyChart", open the app, select [facility], and log in with your MyChart username and password.  Due to Covid, a mask is required upon entering the hospital/clinic. If you do not have a mask, one will be given to you upon arrival. For doctor visits, patients may have 1 support person aged 18 or older with them. For treatment visits, patients cannot have anyone with them due to current Covid guidelines and our immunocompromised population.   

## 2021-07-24 NOTE — Progress Notes (Addendum)
Spoke w/ via phone for pre-op interview---pt daughter yelena yaretskaya cell [phone] speaks [place]----   I stat            Lab results------see below COVID test -----patient  daughter states asymptomatic no test needed Arrive at -------730 am 07-28-2021 NPO after MN NO Solid Food.   Water  from MN until---630 am then npo Med rec completed Medications to take morning of surgery -----amlodipine, metoprolol tartrate, amiodarone, isosorbide mononitrate, oxycodone prn Diabetic medication -----take 1/2 dose of hs lantus insulin on 07-27-2021 (take 17 units), no diabetic meds am of surgery Patient instructed to bring photo id and insurance card day of surgery Patient aware to have Driver (ride ) / caregiver   daughter yelena yartskaya cell [phone] will drop pt off for 24 hours after surgery  Patient Special Instructions -----bring cpap mask tubing and machine and leave in car Pre-Op special Istructions -----none Patient verbalized understanding of instructions that were given at this phone interview. Patient denies shortness of breath, chest pain, fever, cough at this phone interview.   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, complicated uti finished antibiotics 07-08-2021 (lov dr c Lucianne Lei dam 06-18-2021 epic) daughter of pt denies cardiac s & s or sob for pt today at pre op call  Chart  and history reviewed reviewed with dr c bass mda pt ok for [facility] per dr c bass mda  PCP: dr Gerrit Heck 05-22-2021 [facility] Cardiologist :dr g Cheryl Flash 04-02-2021 [facility] Chest x-ray :12-30-2020 [facility] EKG :05-23-2021 epic/chart Echo :09-18-2020 [facility] Stress test:none Cardiac Cath : last done 11-21-2020 with  drug eluding stent to right [facility] Oncology lov dr j Lorenso Courier 05-16-2021 epic Activity level: walks with walker for  distances can climb flight of stairs without sob per daughter Sleep Study/ CPAP :uses cpap Fasting Blood Sugar :      / Checks Blood Sugar -- times a day:  blood sugar normal per daughter Blood Thinner/ Instructions /Last Dose:lASA / pt to remain on eliquis per dr [name], pt daughter aware Instructions/ Last Dose :  last dose of eliquis will be 07-27-2021

## 2021-07-24 NOTE — Progress Notes (Signed)
Pt. states he took his morning dose of Decadron prior to treatment.

## 2021-07-25 ENCOUNTER — Other Ambulatory Visit: Payer: Self-pay

## 2021-07-25 ENCOUNTER — Inpatient Hospital Stay: Payer: Medicare HMO

## 2021-07-25 VITALS — BP 107/64 | HR 60 | Temp 98.6°F | Resp 18

## 2021-07-25 DIAGNOSIS — C8338 Diffuse large B-cell lymphoma, lymph nodes of multiple sites: Secondary | ICD-10-CM

## 2021-07-25 DIAGNOSIS — Z5112 Encounter for antineoplastic immunotherapy: Secondary | ICD-10-CM | POA: Diagnosis not present

## 2021-07-25 MED ORDER — PEGFILGRASTIM-CBQV 6 MG/0.6ML ~~LOC~~ SOSY
6.0000 mg | PREFILLED_SYRINGE | Freq: Once | SUBCUTANEOUS | Status: AC
  Administered 2021-07-25: 6 mg via SUBCUTANEOUS
  Filled 2021-07-25: qty 0.6

## 2021-07-25 MED ORDER — AMOXICILLIN-POT CLAVULANATE 875-125 MG PO TABS: 1.0000 | ORAL_TABLET | Freq: Two times a day (BID) | ORAL | 0 refills | Status: DC

## 2021-07-25 NOTE — Addendum Note (Signed)
Addended by: Elizebeth Brooking on: 07/25/2021 11:22 AM   Modules accepted: Orders

## 2021-07-25 NOTE — Progress Notes (Signed)
Seen in flush room on Saturday by Dr. Burr Medico for increased pain and drainage in right leg wound. Abx called in by Dr. Burr Medico. Pt taught to change wet to dry dressing if needed for increased drainage from wound. F/U with Dr. Lorenso Courier within the next week.

## 2021-07-27 ENCOUNTER — Telehealth: Payer: Self-pay | Admitting: Hematology and Oncology

## 2021-07-27 NOTE — H&P (Signed)
Patient is a 75 year old white male from [place] who was seen as a hospital consult on 04/30/2021. He was seen with some abdominal discomfort and during CT evaluation was found to have lymphadenopathy in the retroperitoneum which was extrinsically compressing on the right ureter causing some hydronephrosis. Was also having renal insufficiency. Subsequently underwent cysto insertion of right JJ stent on 05/01/2021 by Dr.Eskridge who was on-call that day. Patient had mild improvement in renal insufficiency. Ultimately had lymph node biopsy which confirmed large B-cell lymphoma. He is scheduled undergo Oncology evaluation. The patient has had some voiding issues prior to insertion of his stent but stent bother has caused him to worsen. Currently most of the issue is urgency and postvoid dribbling and decreased force of stream. I spoke on the telephone with his daughter who translated today. She states that he is having significant bladder outlet symptoms prior to his recent hospitalization mainly consisting of urgency urge incontinence nocturia 4-5 times at night and feelings of incomplete emptying. He has been on terazosin 2 mg daily for this per his PCP.  Previous CT scan during recent hospitalization did show prostatomegaly.  For BPH symptomatology initiated silodosin 8 mg daily in lieu of the current terazosin. Also initiated finasteride 5 mg daily. I am going to see him back here in 6 weeks for follow-up PVR. He will meet with Hematology Oncology in the interim regarding management of his lymphoma. Ultimately will see how he response to therapy regarding removal or exchange of right ureteral stent.  -06/24/21-patient with history of diffuse B-cell lymphoma with associated right-sided hydronephrosis from extrinsic ureteral compression. Underwent cysto insertion of right JJ stent on 05/01/2021. Patient has had normalization of renal function since placement of the stent. He unfortunately has had a fair amount of  stent bother and also has marked BPH and has been on silodosin 8 mg daily and finasteride 5 mg daily. He was recently readmitted to the hospital on 05/23/2021 with Pseudomonas bacteremia in may have had endocarditis for which he is undergoing 6 week course of IV antibiotics. He now has a PICC line. Also undergoing chemotherapy for the B-cell lymphoma. He continues to have significant hematuria but is on anticoagulation with history of blood clots in the past. He is now here for follow-up. In the interim the patient continued to have hematuria which is his biggest concern. He also has had urinary frequency. He thinks that terazosin works better than silodosin in terms of reducing the urinary frequency. He also thinks that finasteride made urinary frequency worse after re-initiated the medicine but he only took it for a few days and this was repaired right before he was rehospitalized for UTI. He therefore has been basically back on terazosin over the last couple of weeks and can sleep now about 2 hours at a time at night. He is not passing any clots. No dysuria.        ALLERGIES: None   MEDICATIONS: Finasteride 5 mg tablet 1 tablet PO Daily  Metformin Hcl  Terazosin Hcl  Amiodarone Hcl  Atorvastatin Calcium  Eliquis  Lantus  Silodosin 8 mg capsule 1 capsule PO Daily     GU PSH: No GU PSH    NON-GU PSH: No Non-GU PSH    GU PMH: BPH w/LUTS - 05/14/2021 Weak Urinary Stream - 05/14/2021 End-Stage Renal Disease      PMH Notes: heart disease    NON-GU PMH: Diffuse large B-cell lymphoma, lymph nodes of multiple sites - 05/14/2021 Diabetes Type 2 Hypertension Sleep  Apnea    FAMILY HISTORY: No Family History    SOCIAL HISTORY: Marital Status: Married Preferred Language: English; Ethnicity: Not Hispanic Or Latino; Race: White Current Smoking Status: Patient has never smoked.   Tobacco Use Assessment Completed: Used Tobacco in last 30 days? Does not use smokeless tobacco. Does not use  drugs. Drinks 2 caffeinated drinks per day. Has not had a blood transfusion.    REVIEW OF SYSTEMS:    GU Review Male:   Patient denies frequent urination, hard to postpone urination, burning/ pain with urination, get up at night to urinate, leakage of urine, stream starts and stops, trouble starting your stream, have to strain to urinate , erection problems, and penile pain.  Gastrointestinal (Upper):   Patient denies nausea, vomiting, and indigestion/ heartburn.  Gastrointestinal (Lower):   Patient denies diarrhea and constipation.  Constitutional:   Patient denies fever, night sweats, weight loss, and fatigue.  Skin:   Patient denies skin rash/ lesion and itching.  Eyes:   Patient denies blurred vision and double vision.  Ears/ Nose/ Throat:   Patient denies sore throat and sinus problems.  Hematologic/Lymphatic:   Patient denies swollen glands and easy bruising.  Cardiovascular:   Patient denies leg swelling and chest pains.  Respiratory:   Patient denies cough and shortness of breath.  Endocrine:   Patient denies excessive thirst.  Musculoskeletal:   Patient denies back pain and joint pain.  Neurological:   Patient denies headaches and dizziness.  Psychologic:   Patient denies depression and anxiety.   VITAL SIGNS: None   Complexity of Data:  Source Of History:  Patient  Records Review:   Previous Doctor Records, Previous Hospital Records, Previous Patient Records  Urine Test Review:   Urinalysis   PROCEDURES:          Urinalysis w/Scope - 81001 Dipstick Dipstick Cont'd Micro  Color: Red Bilirubin: Invalid WBC/hpf: 6 - 10/hpf  Appearance: Turbid Ketones: Invalid RBC/hpf: >60/hpf  Specific Gravity: Invalid Blood: Invalid Bacteria: NS (Not Seen)  pH: Invalid Protein: Invalid Cystals: NS (Not Seen)  Glucose: Invalid Urobilinogen: Invalid Casts: NS (Not Seen)    Nitrites: Invalid Trichomonas: Not Present    Leukocyte Esterase: Invalid Mucous: Not Present      Epithelial Cells:  NS (Not Seen)      Yeast: NS (Not Seen)      Sperm: Not Present    Notes:  No dip/spun micro due to color interference     ASSESSMENT:      ICD-10 Details  1 GU:   BPH w/LUTS - N40.1 Chronic, Exacerbation  2   Weak Urinary Stream - R39.12 Chronic, Exacerbation  4   Hydronephrosis - [id number] Acute, Complicated Injury  3 NON-GU:   Diffuse large B-cell lymphoma, lymph nodes of multiple sites - [id number] Acute, Complicated Injury     PLAN:           Document Letter(s):  Created for Patient: Clinical Summary         Notes:   We discussed continued need for ureteral stent to promote normal renal function to allow him to maximize renal function to finish his chemotherapy. Stent will need to be changed or removed around the middle of September 2022. We can reassess ureter at that time with retrograde but may need stent for another 3 months until chemotherapy results are ascertained.   In regard to medical management for BPH I recommended he re-initiate finasteride 5 mg daily dose to see if this would be  better tolerated now that he has been treated for the UTI. I told him he could stay on the terazosin if he thought this work better than the silodosin. We are going to schedule for stent exchange in the near future. All of the information was shared with the patient today through the assistance of his daughter who translated over phone call

## 2021-07-27 NOTE — Telephone Encounter (Signed)
Scheduled per sch msg. Called and left msg  

## 2021-07-27 NOTE — Anesthesia Preprocedure Evaluation (Addendum)
Anesthesia Evaluation  Patient identified by MRN, date of birth, ID band Patient awake    Reviewed: Allergy & Precautions, NPO status , Patient's Chart, lab work & pertinent test results  Airway Mallampati: III  TM Distance: >3 FB Neck ROM: Full    Dental  (+) Teeth Intact, Dental Advisory Given All teeth caps:   Pulmonary sleep apnea and Continuous Positive Airway Pressure Ventilation ,    Pulmonary exam normal breath sounds clear to auscultation       Cardiovascular hypertension, + CAD and +CHF (grade 1 diastolic dysfunction, chronic LE edema)  Normal cardiovascular exam+ dysrhythmias (eliquis) Atrial Fibrillation  Rhythm:Regular Rate:Normal  Echo 05/2021: 1. Left ventricular ejection fraction, by estimation, is 50 to 55%. The  left ventricle has low normal function. The left ventricle has no regional  wall motion abnormalities. There is mild left ventricular hypertrophy.  Left ventricular diastolic  parameters are consistent with Grade I diastolic dysfunction (impaired  relaxation). The average left ventricular global longitudinal strain is  -16.5 %. The global longitudinal strain is normal.  2. Right ventricular systolic function is normal. The right ventricular  size is normal. There is normal pulmonary artery systolic pressure.  3. Left atrial size was mildly dilated.  4. The mitral valve is normal in structure. No evidence of mitral valve  regurgitation. No evidence of mitral stenosis.  5. The aortic valve is calcified. There is moderate calcification of the  aortic valve. There is mild thickening of the aortic valve. Aortic valve  regurgitation is not visualized. Mild aortic valve sclerosis is present,  with no evidence of aortic valve  stenosis. Aortic valve mean gradient measures 7.0 mmHg. Aortic valve Vmax  measures 1.85 m/s.  6. The inferior vena cava is normal in size with greater than 50%  respiratory  variability, suggesting right atrial pressure of 3 mmHg.    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)- Hb down to 7.9 from 8.9, now s/p 1 unit prbc   Anesthesia Other Findings   Reproductive/Obstetrics negative OB ROS                           Anesthesia Physical Anesthesia Plan  ASA: 3  Anesthesia Plan: General   Post-op Pain Management:    Induction: Intravenous  PONV Risk Score and Plan: 3 and Ondansetron, Dexamethasone and Treatment may vary due to age or medical condition  Airway Management Planned: LMA  Additional Equipment: None  Intra-op Plan:   Post-operative Plan: Extubation in OR  Informed Consent: I have reviewed the patients History and Physical, chart, labs and discussed the procedure including the risks, benefits and alternatives for the proposed anesthesia with the patient or authorized representative who has indicated his/her understanding and acceptance.     Dental advisory given  Plan Discussed with: CRNA  Anesthesia Plan Comments: (FS>200 in preop, per pt has been high since starting chemotherapy w/ prednisone. Treated w/ 10 units short-acting insulin)       Anesthesia Quick Evaluation

## 2021-07-28 ENCOUNTER — Encounter (HOSPITAL_BASED_OUTPATIENT_CLINIC_OR_DEPARTMENT_OTHER): Payer: Self-pay | Admitting: Urology

## 2021-07-28 ENCOUNTER — Ambulatory Visit (HOSPITAL_BASED_OUTPATIENT_CLINIC_OR_DEPARTMENT_OTHER): Payer: Medicare HMO | Admitting: Anesthesiology

## 2021-07-28 ENCOUNTER — Ambulatory Visit (HOSPITAL_BASED_OUTPATIENT_CLINIC_OR_DEPARTMENT_OTHER)
Admission: RE | Admit: 2021-07-28 | Discharge: 2021-07-28 | Disposition: A | Discharge status: Home / Self Care | Payer: Medicare HMO | Source: Ambulatory Visit | Attending: Urology | Admitting: Urology

## 2021-07-28 ENCOUNTER — Other Ambulatory Visit: Payer: Self-pay

## 2021-07-28 ENCOUNTER — Encounter (HOSPITAL_BASED_OUTPATIENT_CLINIC_OR_DEPARTMENT_OTHER)
Admission: RE | Disposition: A | Discharge status: Home / Self Care | Payer: Self-pay | Source: Ambulatory Visit | Attending: Urology

## 2021-07-28 DIAGNOSIS — C8338 Diffuse large B-cell lymphoma, lymph nodes of multiple sites: Secondary | ICD-10-CM | POA: Diagnosis not present

## 2021-07-28 DIAGNOSIS — Z7901 Long term (current) use of anticoagulants: Secondary | ICD-10-CM | POA: Diagnosis not present

## 2021-07-28 DIAGNOSIS — R3912 Poor urinary stream: Secondary | ICD-10-CM | POA: Diagnosis not present

## 2021-07-28 DIAGNOSIS — Z79899 Other long term (current) drug therapy: Secondary | ICD-10-CM | POA: Diagnosis not present

## 2021-07-28 DIAGNOSIS — Z466 Encounter for fitting and adjustment of urinary device: Secondary | ICD-10-CM | POA: Insufficient documentation

## 2021-07-28 DIAGNOSIS — R3914 Feeling of incomplete bladder emptying: Secondary | ICD-10-CM | POA: Diagnosis not present

## 2021-07-28 DIAGNOSIS — N131 Hydronephrosis with ureteral stricture, not elsewhere classified: Secondary | ICD-10-CM | POA: Diagnosis not present

## 2021-07-28 DIAGNOSIS — N3941 Urge incontinence: Secondary | ICD-10-CM | POA: Diagnosis not present

## 2021-07-28 DIAGNOSIS — Z794 Long term (current) use of insulin: Secondary | ICD-10-CM | POA: Insufficient documentation

## 2021-07-28 DIAGNOSIS — N401 Enlarged prostate with lower urinary tract symptoms: Secondary | ICD-10-CM | POA: Insufficient documentation

## 2021-07-28 DIAGNOSIS — Z7984 Long term (current) use of oral hypoglycemic drugs: Secondary | ICD-10-CM | POA: Diagnosis not present

## 2021-07-28 DIAGNOSIS — R351 Nocturia: Secondary | ICD-10-CM | POA: Insufficient documentation

## 2021-07-28 HISTORY — DX: Malignant (primary) neoplasm, unspecified: C80.1

## 2021-07-28 HISTORY — PX: CYSTOSCOPY W/ URETERAL STENT PLACEMENT: SHX1429

## 2021-07-28 HISTORY — DX: Endocarditis, valve unspecified: I38

## 2021-07-28 HISTORY — DX: Urinary tract infection, site not specified: N39.0

## 2021-07-28 LAB — POCT I-STAT, CHEM 8
BUN: 34 mg/dL — ABNORMAL HIGH (ref 8–23)
Calcium, Ion: 1.25 mmol/L (ref 1.15–1.40)
Chloride: 99 mmol/L (ref 98–111)
Creatinine, Ser: 1.6 mg/dL — ABNORMAL HIGH (ref 0.61–1.24)
Glucose, Bld: 242 mg/dL — ABNORMAL HIGH (ref 70–99)
HCT: 27 % — ABNORMAL LOW (ref 39.0–52.0)
Hemoglobin: 9.2 g/dL — ABNORMAL LOW (ref 13.0–17.0)
Potassium: 4.4 mmol/L (ref 3.5–5.1)
Sodium: 138 mmol/L (ref 135–145)
TCO2: 25 mmol/L (ref 22–32)

## 2021-07-28 LAB — GLUCOSE, CAPILLARY: Glucose-Capillary: 188 mg/dL — ABNORMAL HIGH (ref 70–99)

## 2021-07-28 SURGERY — CYSTOSCOPY, WITH RETROGRADE PYELOGRAM AND URETERAL STENT INSERTION
Anesthesia: General | Site: Ureter | Laterality: Right

## 2021-07-28 MED ORDER — STERILE WATER FOR IRRIGATION IR SOLN
Status: DC | PRN
  Administered 2021-07-28: 3000 mL

## 2021-07-28 MED ORDER — OXYCODONE HCL 5 MG PO TABS: 5.0000 mg | ORAL_TABLET | Freq: Once | ORAL | Status: DC | PRN

## 2021-07-28 MED ORDER — OXYCODONE HCL 5 MG/5ML PO SOLN: 5.0000 mg | Freq: Once | ORAL | Status: DC | PRN

## 2021-07-28 MED ORDER — IOHEXOL 300 MG/ML  SOLN
INTRAMUSCULAR | Status: DC | PRN
  Administered 2021-07-28: 20 mL

## 2021-07-28 MED ORDER — ACETAMINOPHEN 500 MG PO TABS
1000.0000 mg | ORAL_TABLET | Freq: Once | ORAL | Status: AC
  Administered 2021-07-28: 1000 mg via ORAL

## 2021-07-28 MED ORDER — CEFAZOLIN SODIUM-DEXTROSE 2-4 GM/100ML-% IV SOLN
2.0000 g | INTRAVENOUS | Status: AC
  Administered 2021-07-28: 2 g via INTRAVENOUS

## 2021-07-28 MED ORDER — PROPOFOL 10 MG/ML IV BOLUS
INTRAVENOUS | Status: DC | PRN
  Administered 2021-07-28: 30 mg via INTRAVENOUS
  Administered 2021-07-28: 20 mg via INTRAVENOUS
  Administered 2021-07-28: 100 mg via INTRAVENOUS

## 2021-07-28 MED ORDER — PHENYLEPHRINE HCL (PRESSORS) 10 MG/ML IV SOLN
INTRAVENOUS | Status: DC | PRN
  Administered 2021-07-28: 40 ug via INTRAVENOUS
  Administered 2021-07-28: 80 ug via INTRAVENOUS
  Administered 2021-07-28: 40 ug via INTRAVENOUS

## 2021-07-28 MED ORDER — CEFAZOLIN SODIUM-DEXTROSE 2-4 GM/100ML-% IV SOLN
INTRAVENOUS | Status: AC
  Filled 2021-07-28: qty 100

## 2021-07-28 MED ORDER — LIDOCAINE 2% (20 MG/ML) 5 ML SYRINGE
INTRAMUSCULAR | Status: AC
  Filled 2021-07-28: qty 5

## 2021-07-28 MED ORDER — FENTANYL CITRATE (PF) 100 MCG/2ML IJ SOLN
INTRAMUSCULAR | Status: AC
  Filled 2021-07-28: qty 2

## 2021-07-28 MED ORDER — INSULIN ASPART 100 UNIT/ML IJ SOLN
INTRAMUSCULAR | Status: AC
  Filled 2021-07-28: qty 1

## 2021-07-28 MED ORDER — FENTANYL CITRATE (PF) 100 MCG/2ML IJ SOLN
INTRAMUSCULAR | Status: DC | PRN
  Administered 2021-07-28 (×2): 50 ug via INTRAVENOUS

## 2021-07-28 MED ORDER — AMISULPRIDE (ANTIEMETIC) 5 MG/2ML IV SOLN: 10.0000 mg | Freq: Once | INTRAVENOUS | Status: DC | PRN

## 2021-07-28 MED ORDER — ONDANSETRON HCL 4 MG/2ML IJ SOLN
INTRAMUSCULAR | Status: AC
  Filled 2021-07-28: qty 2

## 2021-07-28 MED ORDER — HYDROMORPHONE HCL 1 MG/ML IJ SOLN: 0.2500 mg | INTRAMUSCULAR | Status: DC | PRN

## 2021-07-28 MED ORDER — LACTATED RINGERS IV SOLN: INTRAVENOUS | Status: DC

## 2021-07-28 MED ORDER — LIDOCAINE HCL (CARDIAC) PF 100 MG/5ML IV SOSY
PREFILLED_SYRINGE | INTRAVENOUS | Status: DC | PRN
Start: 2021-07-28 — End: 2021-07-28
  Administered 2021-07-28: 60 mg via INTRAVENOUS

## 2021-07-28 MED ORDER — PROPOFOL 10 MG/ML IV BOLUS
INTRAVENOUS | Status: AC
  Filled 2021-07-28: qty 20

## 2021-07-28 MED ORDER — INSULIN ASPART 100 UNIT/ML IJ SOLN
10.0000 [IU] | Freq: Once | INTRAMUSCULAR | Status: AC
  Administered 2021-07-28: 10 [IU] via SUBCUTANEOUS

## 2021-07-28 MED ORDER — ACETAMINOPHEN 500 MG PO TABS
ORAL_TABLET | ORAL | Status: AC
  Filled 2021-07-28: qty 2

## 2021-07-28 MED ORDER — ONDANSETRON HCL 4 MG/2ML IJ SOLN
INTRAMUSCULAR | Status: DC | PRN
  Administered 2021-07-28: 4 mg via INTRAVENOUS

## 2021-07-28 SURGICAL SUPPLY — 27 items
BAG DRAIN URO-CYSTO SKYTR STRL (DRAIN) ×2 IMPLANT
BAG DRN UROCATH (DRAIN) ×1
BULB IRRIG PATHFIND (MISCELLANEOUS) IMPLANT
CATH URET 5FR 28IN CONE TIP (BALLOONS)
CATH URET 5FR 28IN OPEN ENDED (CATHETERS) ×2 IMPLANT
CATH URET 5FR 70CM CONE TIP (BALLOONS) IMPLANT
CLOTH BEACON ORANGE TIMEOUT ST (SAFETY) ×2 IMPLANT
ELECT REM PT RETURN 9FT ADLT (ELECTROSURGICAL)
ELECTRODE REM PT RTRN 9FT ADLT (ELECTROSURGICAL) IMPLANT
FIBER LASER FLEXIVA 365 (UROLOGICAL SUPPLIES) IMPLANT
GLOVE SURG ENC MOIS LTX SZ6 (GLOVE) ×2 IMPLANT
GLOVE SURG ENC MOIS LTX SZ7.5 (GLOVE) ×2 IMPLANT
GLOVE SURG UNDER POLY LF SZ7 (GLOVE) ×2 IMPLANT
GOWN STRL REUS W/TWL LRG LVL3 (GOWN DISPOSABLE) ×2 IMPLANT
GUIDEWIRE ANG ZIPWIRE 038X150 (WIRE) IMPLANT
GUIDEWIRE STR DUAL SENSOR (WIRE) IMPLANT
IV NS IRRIG 3000ML ARTHROMATIC (IV SOLUTION) ×2 IMPLANT
KIT TURNOVER CYSTO (KITS) ×2 IMPLANT
MANIFOLD NEPTUNE II (INSTRUMENTS) IMPLANT
PACK CYSTO (CUSTOM PROCEDURE TRAY) ×2 IMPLANT
STENT URET 6FRX26 CONTOUR (STENTS) ×2 IMPLANT
SYR 20ML LL LF (SYRINGE) ×2 IMPLANT
SYR TOOMEY IRRIG 70ML (MISCELLANEOUS)
SYRINGE TOOMEY IRRIG 70ML (MISCELLANEOUS) IMPLANT
TRACTIP FLEXIVA PULS ID 200XHI (Laser) IMPLANT
TRACTIP FLEXIVA PULSE ID 200 (Laser)
TUBE CONNECTING 12X1/4 (SUCTIONS) IMPLANT

## 2021-07-28 NOTE — Discharge Instructions (Signed)
CYSTOSCOPY HOME CARE INSTRUCTIONS  Activity: Rest for the remainder of the day.  Do not drive or operate equipment today.  You may resume normal activities in one to two days as instructed by your physician.   Meals: Drink plenty of liquids and eat light foods such as gelatin or soup this evening.  You may return to a normal meal plan tomorrow.  Return to Work: You may return to work in one to two days or as instructed by your physician.  Special Instructions / Symptoms: Call your physician if any of these symptoms occur:   -persistent or heavy bleeding  -bleeding which continues after first few urination  -large blood clots that are difficult to pass  -urine stream diminishes or stops completely  -fever equal to or higher than 101 degrees Farenheit.  -cloudy urine with a strong, foul odor  -severe pain  Females should always wipe from front to back after elimination.  You may feel some burning pain when you urinate.  This should disappear with time.  Applying moist heat to the lower abdomen or a hot tub bath may help relieve the pain. \

## 2021-07-28 NOTE — Anesthesia Procedure Notes (Signed)
Procedure Name: LMA Insertion Date/Time: 07/28/2021 9:38 AM Performed by: Justice Rocher, CRNA Pre-anesthesia Checklist: Patient identified, Emergency Drugs available, Suction available, Patient being monitored and Timeout performed Patient Re-evaluated:Patient Re-evaluated prior to induction Oxygen Delivery Method: Circle system utilized Preoxygenation: Pre-oxygenation with 100% oxygen Induction Type: IV induction Ventilation: Mask ventilation without difficulty LMA: LMA inserted LMA Size: 5.0 Number of attempts: 1 Airway Equipment and Method: Bite block Placement Confirmation: positive ETCO2, breath sounds checked- equal and bilateral and CO2 detector Tube secured with: Tape Dental Injury: Teeth and Oropharynx as per pre-operative assessment

## 2021-07-28 NOTE — Transfer of Care (Signed)
Immediate Anesthesia Transfer of Care Note  Patient: [name]  Procedure(s) Performed: Procedure(s) (LRB): CYSTOSCOPY WITH RETROGRADE PYELOGRAM/URETERAL STENT EXCHANGE (Right)  Patient Location: PACU  Anesthesia Type: General  Level of Consciousness: awake, sedated, patient cooperative and responds to stimulation  Airway & Oxygen Therapy: Patient Spontanous Breathing and Patient connected to [place] 02 and soft FM   Post-op Assessment: Report given to PACU RN, Post -op Vital signs reviewed and stable and Patient moving all extremities  Post vital signs: Reviewed and stable  Complications: No apparent anesthesia complications

## 2021-07-28 NOTE — Op Note (Signed)
Preoperative diagnosis:  1.  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  Same  Procedure(s): 1.  Cystoscopy with right retrograde pyelogram, intraoperative interpretation.  Right JJ stent exchange  Surgeon: Dr. [name]  Anesthesia: General  Complications: None  EBL: Minimal  Specimens: None  Disposition of specimens: Not applicable  Intraoperative findings: Moderately encrusted stent within the urinary bladder.  Able to remove the stent without difficulty.  Retrograde pyelogram confirmed some mild narrowing in the proximal ureter but much improved from initial retrograde suggesting response to chemotherapy for lymphoma.  6 [place] by 26 cm Percuflex plus soft contour stent exchanged  Indication: Patient is a 76 year old [place] male presented with hydronephrosis and flank pain.  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  He now has undergone 3 cycles out of 6 chemotherapy for his B-cell lymphoma and presents at this time to undergo right JJ stent exchange and retrograde.  Description of procedure:  After obtaining informed consent for the patient is taken the major cystoscopy suite placed under general anesthesia.  He is placed in the dorsolithotomy position genitalia prepped and draped in usual sterile fashion.  Cystoscopy is carried out 21 [place] cystoscope.  Patient's right JJ stent was identified was noted to be moderately encrusted within the urinary bladder.  I utilized the alligator forceps to pull the stent just beyond the urethral meatus.  I attempted to pass a guidewire up the stent but due to encrustation wire would not go.  The right JJ stent was subsequently removed completely.  5 French open tip catheter was then utilized to cannulate the right distal ureter and retrograde  pyelogram confirmed some notching and mild narrowing of the right ureter but no evidence of hydronephrosis proximally.  Looked much improved over previous retrograde.  A sensor wire was passed up the renal pelvis and open tip catheter removed.  6 [place] by 26 cm Percuflex plus soft Contour stent was placed leaving a proximal coil in the renal pelvis and a distal coil in the bladder.  There was flow of urine through and around the stent noted.  The bladder was emptied procedure terminated.  He was awakened from anesthesia and taken back to the recovery room in stable condition.  No immediate complication from the procedure.

## 2021-07-28 NOTE — Interval H&P Note (Signed)
History and Physical Interval Note:  07/28/2021 9:28 AM  [name]  has presented today for surgery, with the diagnosis of RIGHT HYDRONEPHROSIS.  The various methods of treatment have been discussed with the patient and family. After consideration of risks, benefits and other options for treatment, the patient has consented to  Procedure(s) with comments: [place] (Right) - 30 MINS as a surgical intervention.  The patient's history has been reviewed, patient examined, no change in status, stable for surgery.  I have reviewed the patient's chart and labs.  Questions were answered to the patient's satisfaction.     [name]

## 2021-07-28 NOTE — Anesthesia Postprocedure Evaluation (Signed)
Anesthesia Post Note  Patient: [name]  Procedure(s) Performed: CYSTOSCOPY WITH RETROGRADE PYELOGRAM/URETERAL STENT EXCHANGE (Right: Ureter)     Patient location during evaluation: PACU Anesthesia Type: General Level of consciousness: awake and alert, oriented and patient cooperative Pain management: pain level controlled Vital Signs Assessment: post-procedure vital signs reviewed and stable Respiratory status: spontaneous breathing, nonlabored ventilation and respiratory function stable Cardiovascular status: blood pressure returned to baseline and stable Postop Assessment: no apparent nausea or vomiting Anesthetic complications: no   No notable events documented.  Last Vitals:  Vitals:   07/28/21 1015 07/28/21 1030  BP: 140/72 128/69  Pulse: 67 65  Resp: 18 19  Temp:  (!) 36.4 C  SpO2: 97% 95%    Last Pain:  Vitals:   07/28/21 1030  TempSrc:   PainSc: 0-No pain                 Pervis Hocking

## 2021-07-29 ENCOUNTER — Inpatient Hospital Stay: Payer: Medicare HMO

## 2021-07-29 ENCOUNTER — Encounter (HOSPITAL_BASED_OUTPATIENT_CLINIC_OR_DEPARTMENT_OTHER): Payer: Self-pay | Admitting: Urology

## 2021-07-29 DIAGNOSIS — Z452 Encounter for adjustment and management of vascular access device: Secondary | ICD-10-CM

## 2021-07-29 DIAGNOSIS — Z5112 Encounter for antineoplastic immunotherapy: Secondary | ICD-10-CM | POA: Diagnosis not present

## 2021-07-29 MED ORDER — SODIUM CHLORIDE 0.9% FLUSH
10.0000 mL | Freq: Once | INTRAVENOUS | Status: AC
  Administered 2021-07-29: 10 mL

## 2021-07-29 MED ORDER — HEPARIN SOD (PORK) LOCK FLUSH 100 UNIT/ML IV SOLN
250.0000 [IU] | Freq: Once | INTRAVENOUS | Status: AC
  Administered 2021-07-29: 250 [IU]

## 2021-08-05 ENCOUNTER — Inpatient Hospital Stay: Payer: Medicare HMO

## 2021-08-07 ENCOUNTER — Telehealth: Payer: Self-pay | Admitting: Hematology and Oncology

## 2021-08-07 NOTE — Telephone Encounter (Signed)
Called patient regarding upcoming appointments, patient's home and cell phone line did not have voicemail set up. Calender will be mailed.

## 2021-08-12 ENCOUNTER — Other Ambulatory Visit: Payer: Self-pay

## 2021-08-12 ENCOUNTER — Inpatient Hospital Stay: Payer: Medicare HMO

## 2021-08-12 ENCOUNTER — Inpatient Hospital Stay (HOSPITAL_BASED_OUTPATIENT_CLINIC_OR_DEPARTMENT_OTHER): Payer: Medicare HMO | Admitting: Hematology and Oncology

## 2021-08-12 VITALS — BP 132/74 | HR 75 | Temp 97.8°F | Resp 18 | Ht 72.0 in | Wt 246.7 lb

## 2021-08-12 VITALS — BP 112/66 | HR 68 | Temp 98.1°F | Resp 18

## 2021-08-12 DIAGNOSIS — C8338 Diffuse large B-cell lymphoma, lymph nodes of multiple sites: Secondary | ICD-10-CM | POA: Diagnosis not present

## 2021-08-12 DIAGNOSIS — C8333 Diffuse large B-cell lymphoma, intra-abdominal lymph nodes: Secondary | ICD-10-CM

## 2021-08-12 DIAGNOSIS — Z452 Encounter for adjustment and management of vascular access device: Secondary | ICD-10-CM

## 2021-08-12 DIAGNOSIS — Z95828 Presence of other vascular implants and grafts: Secondary | ICD-10-CM | POA: Diagnosis not present

## 2021-08-12 DIAGNOSIS — R591 Generalized enlarged lymph nodes: Secondary | ICD-10-CM

## 2021-08-12 DIAGNOSIS — Z5112 Encounter for antineoplastic immunotherapy: Secondary | ICD-10-CM | POA: Diagnosis not present

## 2021-08-12 LAB — CBC WITH DIFFERENTIAL (CANCER CENTER ONLY)
Abs Immature Granulocytes: 0.16 10*3/uL — ABNORMAL HIGH (ref 0.00–0.07)
Basophils Absolute: 0.1 10*3/uL (ref 0.0–0.1)
Basophils Relative: 1 %
Eosinophils Absolute: 0.1 10*3/uL (ref 0.0–0.5)
Eosinophils Relative: 1 %
HCT: 27.1 % — ABNORMAL LOW (ref 39.0–52.0)
Hemoglobin: 8.9 g/dL — ABNORMAL LOW (ref 13.0–17.0)
Immature Granulocytes: 2 %
Lymphocytes Relative: 31 %
Lymphs Abs: 3.1 10*3/uL (ref 0.7–4.0)
MCH: 26 pg (ref 26.0–34.0)
MCHC: 32.8 g/dL (ref 30.0–36.0)
MCV: 79.2 fL — ABNORMAL LOW (ref 80.0–100.0)
Monocytes Absolute: 1.1 10*3/uL — ABNORMAL HIGH (ref 0.1–1.0)
Monocytes Relative: 10 %
Neutro Abs: 5.5 10*3/uL (ref 1.7–7.7)
Neutrophils Relative %: 55 %
Platelet Count: 227 10*3/uL (ref 150–400)
RBC: 3.42 MIL/uL — ABNORMAL LOW (ref 4.22–5.81)
RDW: 17.2 % — ABNORMAL HIGH (ref 11.5–15.5)
WBC Count: 10.1 10*3/uL (ref 4.0–10.5)
nRBC: 0 % (ref 0.0–0.2)

## 2021-08-12 LAB — CMP (CANCER CENTER ONLY)
ALT: 42 U/L (ref 0–44)
AST: 29 U/L (ref 15–41)
Albumin: 3.1 g/dL — ABNORMAL LOW (ref 3.5–5.0)
Alkaline Phosphatase: 105 U/L (ref 38–126)
Anion gap: 12 (ref 5–15)
BUN: 23 mg/dL (ref 8–23)
CO2: 22 mmol/L (ref 22–32)
Calcium: 8.8 mg/dL — ABNORMAL LOW (ref 8.9–10.3)
Chloride: 105 mmol/L (ref 98–111)
Creatinine: 1.61 mg/dL — ABNORMAL HIGH (ref 0.61–1.24)
GFR, Estimated: 45 mL/min — ABNORMAL LOW (ref >60)
Glucose, Bld: 322 mg/dL — ABNORMAL HIGH (ref 70–99)
Potassium: 4.2 mmol/L (ref 3.5–5.1)
Sodium: 139 mmol/L (ref 135–145)
Total Bilirubin: 0.8 mg/dL (ref 0.3–1.2)
Total Protein: 5.2 g/dL — ABNORMAL LOW (ref 6.5–8.1)

## 2021-08-12 LAB — URIC ACID: Uric Acid, Serum: 4.5 mg/dL (ref 3.7–8.6)

## 2021-08-12 LAB — LACTATE DEHYDROGENASE: LDH: 418 U/L — ABNORMAL HIGH (ref 98–192)

## 2021-08-12 MED ORDER — VINCRISTINE SULFATE CHEMO INJECTION 1 MG/ML
2.0000 mg | Freq: Once | INTRAVENOUS | Status: AC
  Administered 2021-08-12: 2 mg via INTRAVENOUS
  Filled 2021-08-12: qty 2

## 2021-08-12 MED ORDER — ACETAMINOPHEN 325 MG PO TABS
650.0000 mg | ORAL_TABLET | Freq: Once | ORAL | Status: AC
  Administered 2021-08-12: 650 mg via ORAL
  Filled 2021-08-12: qty 2

## 2021-08-12 MED ORDER — HEPARIN SOD (PORK) LOCK FLUSH 100 UNIT/ML IV SOLN
250.0000 [IU] | Freq: Once | INTRAVENOUS | Status: AC | PRN
  Administered 2021-08-12: 250 [IU]

## 2021-08-12 MED ORDER — DIPHENHYDRAMINE HCL 25 MG PO CAPS
50.0000 mg | ORAL_CAPSULE | Freq: Once | ORAL | Status: AC
  Administered 2021-08-12: 50 mg via ORAL
  Filled 2021-08-12: qty 2

## 2021-08-12 MED ORDER — SODIUM CHLORIDE 0.9 % IV SOLN
50.0000 mg/m2 | Freq: Once | INTRAVENOUS | Status: AC
  Administered 2021-08-12: 120 mg via INTRAVENOUS
  Filled 2021-08-12: qty 6

## 2021-08-12 MED ORDER — SODIUM CHLORIDE 0.9% FLUSH
10.0000 mL | Freq: Once | INTRAVENOUS | Status: AC
  Administered 2021-08-12: 10 mL

## 2021-08-12 MED ORDER — SODIUM CHLORIDE 0.9 % IV SOLN
375.0000 mg/m2 | Freq: Once | INTRAVENOUS | Status: AC
  Administered 2021-08-12: 900 mg via INTRAVENOUS
  Filled 2021-08-12: qty 50

## 2021-08-12 MED ORDER — SODIUM CHLORIDE 0.9 % IV SOLN
Freq: Once | INTRAVENOUS | Status: AC
Start: 2021-08-12 — End: 2021-08-12

## 2021-08-12 MED ORDER — SODIUM CHLORIDE 0.9% FLUSH
10.0000 mL | INTRAVENOUS | Status: DC | PRN
  Administered 2021-08-12: 10 mL

## 2021-08-12 MED ORDER — SODIUM CHLORIDE 0.9 % IV SOLN
10.0000 mg | Freq: Once | INTRAVENOUS | Status: AC
  Administered 2021-08-12: 10 mg via INTRAVENOUS
  Filled 2021-08-12: qty 10

## 2021-08-12 MED ORDER — PALONOSETRON HCL INJECTION 0.25 MG/5ML
0.2500 mg | Freq: Once | INTRAVENOUS | Status: AC
  Administered 2021-08-12: 0.25 mg via INTRAVENOUS
  Filled 2021-08-12: qty 5

## 2021-08-12 MED ORDER — SODIUM CHLORIDE 0.9 % IV SOLN
750.0000 mg/m2 | Freq: Once | INTRAVENOUS | Status: AC
  Administered 2021-08-12: 1740 mg via INTRAVENOUS
  Filled 2021-08-12: qty 87

## 2021-08-12 NOTE — Progress Notes (Signed)
[name]   [phone]  PROGRESS NOTE  Patient Care Team: [name], MD as PCP - General (Family Medicine)  Hematological/Oncological History # Diffuse Large B Cell Lymphoma, Stage III/IV 04/30/2021: presented with RLQ abdominal pain. CT abdomen/pelvis shows pathologic mesenteric, retroperitoneal, and pelvic adenopathy, as described above. Moderate splenomegaly. Lobulated retroperitoneal soft tissue masses in the region of the right ureteropelvic junction likely resulting in mild right hydronephrosis secondary to extrinsic mass effect as well as within the right retroperitoneum 05/04/2021: needle core biopsy of right retroperitoneal lymph node shows large B cell lymphoma. 05/13/2021: establish care with Dr. Lorenso Courier 05/23/2021-06/01/2021: admitted with urosepsis. Concern for endocarditis, started on 6 week course abx via PICC line.   06/09/2021-06/11/2021: Cycle 1 of R-CEOP 07/01/2021-07/03/2021: Cycle 2 of R-CEOP 07/22/2021-07/24/2021: Cycle 3 of R-CEOP 08/12/2021-08/14/2021: Cycle 4 of R-CEOP  Interval History:  [name] 75 y.o. male with medical history significant for stage III/IV diffuse large B-cell lymphoma presents for a follow up visit. The patient's last visit was on 07/22/2021. In the interim since the last visit he completed Cycle 3 of R-CEOP.   On exam today, [name] reports he has been well overall in the interim since his last visit.  He has been having some issues with constipation and some of his fingertips.  He reports his appetite is good but that food "tastes weird".  He notes he is not having any further episodes of blood in his urine.  His energy levels are okay.  He denies any fevers, chills, night sweats, shortness of breath, chest pain or cough. He has no other complaints.   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 per dr Lucianne Lei dam note 06-18-2021   Hematuria 06/18/2021   Hypertension    Sleep apnea    uses cpap   Uses walker 07/24/2021   prn   UTI (urinary tract infection)    finished antibiotics on 07-08-2021    SURGICAL HISTORY: Past Surgical History:  Procedure Laterality Date   CARDIAC CATHETERIZATION     CYSTOSCOPY W/ URETERAL STENT PLACEMENT Right 07/28/2021   Procedure: CYSTOSCOPY WITH RETROGRADE PYELOGRAM/URETERAL STENT EXCHANGE;  Surgeon: [name], MD;  Location: [facility];  Service: Urology;  Laterality: Right;  [facility], URETEROSCOPY AND STENT PLACEMENT Right 05/01/2021   Procedure: CYSTOSCOPY WITH RIGHT  RETROGRADE PYELOGRAM,  AND RIGHT STENT PLACEMENT;  Surgeon: Festus Aloe, MD;  Location: [facility];  Service: Urology;  Laterality: Right;   EYE SURGERY     INJECTION OF SILICONE OIL Left 13/06/6577   Procedure: INJECTION OF SILICONE OIL;  Surgeon: Sherlynn Stalls, MD;  Location: [facility];  Service: Ophthalmology;  Laterality: Left;   INJECTION OF SILICONE OIL Left 4/69/6295   Procedure: INJECTION OF SILICONE OIL;  Surgeon: Sherlynn Stalls, MD;  Location: [place];  Service: Ophthalmology;  Laterality: Left;   JOINT REPLACEMENT Right    hip   LASER PHOTO ABLATION Left 12/25/2020   Procedure: LASER PHOTO ABLATION;  Surgeon: Sherlynn Stalls, MD;  Location: [place];  Service: Ophthalmology;  Laterality: Left;  PARS PLANA VITRECTOMY Left 11/10/2020   Procedure: PARS PLANA VITRECTOMY WITH 25 GAUGE;  Surgeon: Sherlynn Stalls, MD;  Location: [place];  Service: Ophthalmology;  Laterality: Left;   PARS PLANA VITRECTOMY Left 12/25/2020   Procedure: PARS PLANA VITRECTOMY WITH 25 GAUGE IN LEFT EYE;  Surgeon: Sherlynn Stalls, MD;  Location: [place];   Service: Ophthalmology;  Laterality: Left;   PERFLUORONE INJECTION Left 12/25/2020   Procedure: PERFLUORONE INJECTION;  Surgeon: Sherlynn Stalls, MD;  Location: [place];  Service: Ophthalmology;  Laterality: Left;   PHOTOCOAGULATION WITH LASER Left 11/10/2020   Procedure: PHOTOCOAGULATION WITH LASER;  Surgeon: Sherlynn Stalls, MD;  Location: [facility];  Service: Ophthalmology;  Laterality: Left;   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   Procedure: RETINECTOMY LEFT EYE;  Surgeon: Sherlynn Stalls, MD;  Location: [facility];  Service: Ophthalmology;  Laterality: Left;   SILICON OIL REMOVAL Left 0/35/0093   Procedure: SILICONE OIL REMOVAL;  Surgeon: Sherlynn Stalls, MD;  Location: [facility];  Service: Ophthalmology;  Laterality: Left;   TEE WITHOUT CARDIOVERSION N/A 05/29/2021   Procedure: TRANSESOPHAGEAL ECHOCARDIOGRAM (TEE);  Surgeon: Werner Lean, MD;  Location: [facility];  Service: Cardiovascular;  Laterality: N/A;    SOCIAL HISTORY: Social History   Socioeconomic History   Marital status: Married    Spouse name: Not on file   Number of children: Not on file   Years of education: Not on file   Highest education level: Not on file  Occupational History   Not on file  Tobacco Use   Smoking status: Never   Smokeless tobacco: Never  Vaping Use   Vaping Use: Never used  Substance and Sexual Activity   Alcohol use: Not Currently   Drug use: Never   Sexual activity: Yes  Other Topics Concern   Not on file  Social History Narrative   Not on file   Social Determinants of Health   Financial Resource Strain: Not on file  Food Insecurity: Not on file  Transportation Needs: Not on file  Physical Activity: Not on file  Stress: Not on file  Social Connections: Not on file  Intimate Partner Violence: Not on file    FAMILY HISTORY: Family History  Problem Relation Age of Onset   CAD Other     ALLERGIES:  has no allergies on file.  MEDICATIONS:  Current  Outpatient Medications  Medication Sig Dispense Refill   acetaminophen (TYLENOL) 650 MG CR tablet Take 650 mg by mouth every 8 (eight) hours as needed for pain or fever (headache).     allopurinol (ZYLOPRIM) 300 MG tablet Take 1 tablet (300 mg total) by mouth daily. 90 tablet 1   amiodarone (PACERONE) 200 MG tablet Take 200 mg by mouth every morning.     amLODipine (NORVASC) 10 MG tablet Take 1 tablet (10 mg total) by mouth daily. 30 tablet 1   amoxicillin-clavulanate (AUGMENTIN) 875-125 MG tablet Take 1 tablet by mouth 2 (two) times daily. 14 tablet 0   apixaban (ELIQUIS) 5 MG TABS tablet Take 5 mg by mouth 2 (two) times daily.     atorvastatin (LIPITOR) 20 MG tablet Take 20 mg by mouth at bedtime.     clopidogrel (PLAVIX) 75 MG tablet Take 75 mg by mouth daily.     finasteride (PROSCAR) 5 MG tablet Take 5 mg by mouth at bedtime.     furosemide (LASIX) 20 MG tablet Take 1 tablet (20 mg total) by mouth daily. 30 tablet 1   glipiZIDE (  GLUCOTROL) 10 MG tablet Take 10 mg by mouth 2 (two) times daily.     insulin glargine (LANTUS) 100 unit/mL SOPN Inject 34 Units into the skin at bedtime.     isosorbide mononitrate (IMDUR) 30 MG 24 hr tablet Take 30 mg by mouth every morning.     metFORMIN (GLUCOPHAGE) 1000 MG tablet Take 1,000 mg by mouth 2 (two) times daily.     metoprolol tartrate (LOPRESSOR) 50 MG tablet Take 1 tablet (50 mg total) by mouth 2 (two) times daily. 60 tablet 1   nitroGLYCERIN (NITROSTAT) 0.3 MG SL tablet Place 0.3 mg under the tongue every 5 (five) minutes x 3 doses as needed for chest pain.     oxyCODONE (OXY IR/ROXICODONE) 5 MG immediate release tablet Take 1 tablet (5 mg total) by mouth every 4 (four) hours as needed for severe pain. (Patient not taking: Reported on 07/28/2021) 20 tablet 0   predniSONE (DELTASONE) 20 MG tablet Take 3 tablets (60 mg total) by mouth daily with breakfast. Take 3 pills per day starting on Day 1 of chemotherapy, for 5 total days. 15 tablet 5    prochlorperazine (COMPAZINE) 10 MG tablet Take 1 tablet (10 mg total) by mouth every 6 (six) hours as needed for nausea or vomiting. 30 tablet 0   tenofovir (VIREAD) 300 MG tablet Take 1 tablet (300 mg total) by mouth every other day. 15 tablet 11   No current facility-administered medications for this visit.   Facility-Administered Medications Ordered in Other Visits  Medication Dose Route Frequency Provider Last Rate Last Admin   0.9 %  sodium chloride infusion   Intravenous Once Orson Slick, MD       acetaminophen (TYLENOL) tablet 650 mg  650 mg Oral Once Orson Slick, MD       cyclophosphamide (CYTOXAN) 1,740 mg in sodium chloride 0.9 % 250 mL chemo infusion  750 mg/m2 (Treatment Plan Recorded) Intravenous Once Orson Slick, MD       dexamethasone (DECADRON) 10 mg in sodium chloride 0.9 % 50 mL IVPB  10 mg Intravenous Once Orson Slick, MD       diphenhydrAMINE (BENADRYL) capsule 50 mg  50 mg Oral Once Orson Slick, MD       etoposide (VEPESID) 120 mg in sodium chloride 0.9 % 500 mL chemo infusion  50 mg/m2 (Treatment Plan Recorded) Intravenous Once Orson Slick, MD       heparin lock flush 100 unit/mL  250 Units Intracatheter Once PRN Orson Slick, MD       palonosetron (ALOXI) injection 0.25 mg  0.25 mg Intravenous Once Orson Slick, MD       prochlorperazine (COMPAZINE) 10 MG tablet            riTUXimab-pvvr (RUXIENCE) 900 mg in sodium chloride 0.9 % 160 mL infusion  375 mg/m2 (Treatment Plan Recorded) Intravenous Once Orson Slick, MD       sodium chloride flush (NS) 0.9 % injection 10 mL  10 mL Intracatheter PRN Ledell Peoples IV, MD       sodium chloride flush (NS) 0.9 % injection 10 mL  10 mL Intravenous Once Narda Rutherford T IV, MD       sodium chloride flush (NS) 0.9 % injection 10 mL  10 mL Intracatheter PRN Ledell Peoples IV, MD       vinCRIStine (ONCOVIN) 2 mg in sodium chloride 0.9 %  50 mL chemo infusion  2 mg Intravenous Once Orson Slick, MD        REVIEW OF SYSTEMS:   Constitutional: ( - ) fevers, ( - )  chills , ( - ) night sweats Eyes: ( - ) blurriness of vision, ( - ) double vision, ( - ) watery eyes Ears, nose, mouth, throat, and face: ( - ) mucositis, ( - ) sore throat Respiratory: ( - ) cough, ( - ) dyspnea, ( - ) wheezes Cardiovascular: ( - ) palpitation, ( - ) chest discomfort, ( +) lower extremity swelling Gastrointestinal:  ( - ) nausea, ( - ) heartburn, ( - ) change in bowel habits Skin: ( - ) abnormal skin rashes Lymphatics: ( - ) new lymphadenopathy, ( - ) easy bruising Neurological: ( - ) numbness, ( - ) tingling, ( - ) new weaknesses Behavioral/Psych: ( - ) mood change, ( - ) new changes  All other systems were reviewed with the patient and are negative.  PHYSICAL EXAMINATION: ECOG PERFORMANCE STATUS: 1 - Symptomatic but completely ambulatory  Vitals:   08/12/21 0848  BP: 132/74  Pulse: 75  Resp: 18  Temp: 97.8 F (36.6 C)  SpO2: 98%     Filed Weights   08/12/21 0848  Weight: 246 lb 11.2 oz (111.9 kg)    GENERAL: Well-appearing elderly [place] male, alert, no distress and comfortable SKIN: skin color, texture, turgor are normal, no rashes or significant lesions EYES: conjunctiva are pink and non-injected, sclera clear NECK: supple, non-tender LYMPH:  no palpable lymphadenopathy in the cervical, axillary or inguinal LUNGS: clear to auscultation and percussion with normal breathing effort HEART: regular rate & rhythm and no murmurs. 1+ pitting edema bilaterally in the lower extremities below the knee. PSYCH: alert & oriented x 3, fluent speech NEURO: no focal motor/sensory deficits  LABORATORY DATA:  I have reviewed the data as listed CBC Latest Ref Rng & Units 08/12/2021 07/28/2021 07/22/2021  WBC 4.0 - 10.5 K/uL 10.1 - 10.3  Hemoglobin 13.0 - 17.0 g/dL 8.9(L) 9.2(L) 7.9(L)  Hematocrit 39.0 - 52.0 % 27.1(L) 27.0(L) 24.1(L)  Platelets 150 - 400 K/uL 227 - 227    CMP Latest  Ref Rng & Units 08/12/2021 07/28/2021 07/22/2021  Glucose 70 - 99 mg/dL 322(H) 242(H) 261(H)  BUN 8 - 23 mg/dL 23 34(H) 19  Creatinine 0.61 - 1.24 mg/dL 1.61(H) 1.60(H) 1.92(H)  Sodium 135 - 145 mmol/L 139 138 141  Potassium 3.5 - 5.1 mmol/L 4.2 4.4 4.7  Chloride 98 - 111 mmol/L 105 99 108  CO2 22 - 32 mmol/L 22 - 22  Calcium 8.9 - 10.3 mg/dL 8.8(L) - 8.7(L)  Total Protein 6.5 - 8.1 g/dL 5.2(L) - 5.2(L)  Total Bilirubin 0.3 - 1.2 mg/dL 0.8 - 0.4  Alkaline Phos 38 - 126 U/L 105 - 98  AST 15 - 41 U/L 29 - 43(H)  ALT 0 - 44 U/L 42 - 76(H)     RADIOGRAPHIC STUDIES: I have personally reviewed the radiological images as listed and agreed with the findings in the report: PET CT scan findings with concern for liver involvement, and lymphadenopathy that is FDG avid on both sides of the diaphragm.  Findings are most consistent with a stage III/IV diffuse large B-cell lymphoma.  No results found.  ASSESSMENT & PLAN [name] 75 y.o. male with medical history significant for stage III/IV diffuse large B-cell lymphoma presents for a follow up visit.    After review  of the labs, review of the records, and discussion with the patient the patients findings are most consistent with advanced stage diffuse large B-cell lymphoma.  PET scan confirmed widespread disease in the neck, chest, abdomen and pelvis. Additionally, there is evidence of splenomegaly and a 5.4 cm soft tissue lesion along the right lower kidney. Patient does have a cardiac history significant for atrial fibrillation on amiodarone and therefore we may need to consider dropping anthracyclines from his regimen. The treatment consists of R-CEOP based regimen moving forward.    DLBCL IPI Score: 2 points, good prognosis. 80% progression free survival    # Diffuse Large B Cell Lymphoma, ABC subtype. Stage III/IV -- Findings at this time are most consistent with stage III/IV diffuse large B-cell lymphoma. --Treatment consists R-CEOP  chemotherapy given his cardiac issues and mildly reduced EF(he is on amiodarone for atrial fibrillation) --Started Cycle 1 on 06/09/2021 --Due to neutropenia after Cycle 1, added G-CSF on Day 4.  Plan: --today is Cycle 4 Day 1 --patient Hgb is 8.9, will arrange for weekly labs and transfusion support as needed --PET scan before next visit.  --Return in 3 weeks for Cycle 5 Day 1.   #Chemotherapy Induced Anemia --Hgb 8.9 today --will proceed with treatment today, no plan for blood transfusion --continue weekly labs --continue to monitor   #Supportive Care -- chemotherapy education complete -- port placement to be scheduled after his PICC is removed. We will use the PICC for now.  -- zofran 8mg  q8H PRN and compazine 10mg  PO q6H for nausea -- allopurinol 300mg  PO daily for TLS prophylaxis -- EMLA cream for port -- no pain medication required at this time.   #Hematuria, improving --Likely secondary to anticoagulation (on Eliquis and Plavix), soft tissue lesion along the medial right lower kidney s/p ureteral stent placement. --Hgb is stable at 7.9. Patient will return next week to repeat CBC.  --Patient is scheduled for cystoscopy and ureteral stent exchange on 07/28/2021.  --patient holding Plavix, continuing eliquis.   #Lower extremity edema: --Patient was on HCTZ but was discontiued when hospitalized from AKI in the setting of R hydronephrosis due to retroperitoneal adenopathy causing mass-efect.  --Creatinine is back to baseline.  --continue lasix therapy as prescribed.   Orders Placed This Encounter  Procedures   NM PET Image Restag (PS) Skull Base To Thigh    Standing Status:   Future    Standing Expiration Date:   08/12/2022    Order Specific Question:   If indicated for the ordered procedure, I authorize the administration of a radiopharmaceutical per Radiology protocol    Answer:   Yes    Order Specific Question:   Preferred imaging location?    Answer:   [facility]      All questions were answered. The patient knows to call the clinic with any problems, questions or concerns.  A total of more than 30 minutes were spent on this encounter with face-to-face time and non-face-to-face time, including preparing to see the patient, ordering tests and/or medications, counseling the patient and coordination of care as outlined above.   Ledell Peoples, MD Department of Hematology/Oncology [place] Phone: [phone] Pager: [phone] Email: [email or web address]   08/12/2021 10:04 AM

## 2021-08-12 NOTE — Patient Instructions (Signed)
[facility]  Discharge Instructions: Thank you for choosing [facility] to provide your oncology and hematology care.   If you have a lab appointment with the [facility], please go directly to the [facility] and check in at the registration area.   Wear comfortable clothing and clothing appropriate for easy access to any Portacath or PICC line.   We strive to give you quality time with your provider. You may need to reschedule your appointment if you arrive late (15 or more minutes).  Arriving late affects you and other patients whose appointments are after yours.  Also, if you miss three or more appointments without notifying the office, you may be dismissed from the clinic at the provider's discretion.      For prescription refill requests, have your pharmacy contact our office and allow 72 hours for refills to be completed.    Today you received the following chemotherapy and/or immunotherapy agents: Oncovin, Cytoxan, Vepesid & Rituximab   To help prevent nausea and vomiting after your treatment, we encourage you to take your nausea medication as directed.  BELOW ARE SYMPTOMS THAT SHOULD BE REPORTED IMMEDIATELY: *FEVER GREATER THAN 100.4 F (38 C) OR HIGHER *CHILLS OR SWEATING *NAUSEA AND VOMITING THAT IS NOT CONTROLLED WITH YOUR NAUSEA MEDICATION *UNUSUAL SHORTNESS OF BREATH *UNUSUAL BRUISING OR BLEEDING *URINARY PROBLEMS (pain or burning when urinating, or frequent urination) *BOWEL PROBLEMS (unusual diarrhea, constipation, pain near the anus) TENDERNESS IN MOUTH AND THROAT WITH OR WITHOUT PRESENCE OF ULCERS (sore throat, sores in mouth, or a toothache) UNUSUAL RASH, SWELLING OR PAIN  UNUSUAL VAGINAL DISCHARGE OR ITCHING   Items with * indicate a potential emergency and should be followed up as soon as possible or go to the Emergency Department if any problems should occur.  Please show the CHEMOTHERAPY ALERT CARD or IMMUNOTHERAPY  ALERT CARD at check-in to the Emergency Department and triage nurse.  Should you have questions after your visit or need to cancel or reschedule your appointment, please contact [facility]  Dept: [phone]  and follow the prompts.  Office hours are 8:00 a.m. to 4:30 p.m. Monday - Friday. Please note that voicemails left after 4:00 p.m. may not be returned until the following business day.  We are closed weekends and major holidays. You have access to a nurse at all times for urgent questions. Please call the main number to the clinic Dept: [phone] and follow the prompts.   For any non-urgent questions, you may also contact your provider using MyChart. We now offer e-Visits for anyone 57 and older to request care online for non-urgent symptoms. For details visit [email or web address].   Also download the MyChart app! Go to the app store, search "MyChart", open the app, select [facility], and log in with your MyChart username and password.  Due to Covid, a mask is required upon entering the hospital/clinic. If you do not have a mask, one will be given to you upon arrival. For doctor visits, patients may have 1 support person aged 59 or older with them. For treatment visits, patients cannot have anyone with them due to current Covid guidelines and our immunocompromised population.

## 2021-08-12 NOTE — Progress Notes (Signed)
OK to trt with SrCr of 1.61 today per Dr. Lorenso Courier.

## 2021-08-13 ENCOUNTER — Other Ambulatory Visit: Payer: Self-pay

## 2021-08-13 ENCOUNTER — Inpatient Hospital Stay: Payer: Medicare HMO

## 2021-08-13 ENCOUNTER — Other Ambulatory Visit: Payer: Self-pay | Admitting: *Deleted

## 2021-08-13 ENCOUNTER — Inpatient Hospital Stay: Payer: Medicare HMO | Admitting: Dietician

## 2021-08-13 VITALS — BP 127/72 | HR 77 | Temp 97.6°F | Resp 18

## 2021-08-13 DIAGNOSIS — Z5112 Encounter for antineoplastic immunotherapy: Secondary | ICD-10-CM | POA: Diagnosis not present

## 2021-08-13 DIAGNOSIS — C8338 Diffuse large B-cell lymphoma, lymph nodes of multiple sites: Secondary | ICD-10-CM

## 2021-08-13 MED ORDER — SODIUM CHLORIDE 0.9% FLUSH
10.0000 mL | INTRAVENOUS | Status: DC | PRN
  Administered 2021-08-13: 10 mL

## 2021-08-13 MED ORDER — SODIUM CHLORIDE 0.9 % IV SOLN: Freq: Once | INTRAVENOUS | Status: AC

## 2021-08-13 MED ORDER — PREDNISONE 20 MG PO TABS: 60.0000 mg | ORAL_TABLET | Freq: Every day | ORAL | 5 refills | Status: DC

## 2021-08-13 MED ORDER — HEPARIN SOD (PORK) LOCK FLUSH 100 UNIT/ML IV SOLN
250.0000 [IU] | Freq: Once | INTRAVENOUS | Status: AC | PRN
  Administered 2021-08-13: 250 [IU]

## 2021-08-13 MED ORDER — PROCHLORPERAZINE MALEATE 10 MG PO TABS
10.0000 mg | ORAL_TABLET | Freq: Once | ORAL | Status: AC
  Administered 2021-08-13: 10 mg via ORAL
  Filled 2021-08-13: qty 1

## 2021-08-13 MED ORDER — SODIUM CHLORIDE 0.9 % IV SOLN
50.0000 mg/m2 | Freq: Once | INTRAVENOUS | Status: AC
  Administered 2021-08-13: 120 mg via INTRAVENOUS
  Filled 2021-08-13: qty 6

## 2021-08-13 MED ORDER — PREDNISONE 20 MG PO TABS
60.0000 mg | ORAL_TABLET | Freq: Once | ORAL | Status: AC
  Administered 2021-08-13: 60 mg via ORAL

## 2021-08-13 NOTE — Progress Notes (Signed)
Nutrition Assessment   Reason for Assessment: MST   ASSESSMENT: 75 year old male receiving R-CEOP for B cell lymphoma. He is followed by Dr. Lorenso Courier.   Past medical history includes DM2, CAD, endocarditis, PAF, HTN, sleep apnea, BPH, CKD, hydronephrosis.   Spoke with patient during infusion with assistance of interpreter. Patient reports his appetite is good and is eating well. He reports his wife cooks everything for him from scratch and she is a [employer]. Patient reports foods do not taste as good as they normally do, but he continues to eat 3 meals and snacks, says he like cheese. Patient denies nutrition impact symptoms.   Nutrition Focused Physical Exam: deferred   Medications: oxycodone, prednisone, compazine, glipizide, compazine, lasix, eliquis, lantus, metformin   Labs: 9/28 - glucose 322, Cr 1.61 7/9 - HgbA1c 8.1   Anthropometrics: Weight 246 lb 11.2 oz on 9/28 increased from 242 lb 9.6 oz on 9/13 decreased from 252 lb 4.8 oz on 9/7  Height: 6' Weight: 111.9 kg  UBW: 250 lb  BMI: 33.46   NUTRITION DIAGNOSIS: No nutrition diagnosis at this time   INTERVENTION:  Discussed importance of adequate calories and protein to maintain weights, strength, nutrition Continue eating 3 meals with snacks in between with focus on protein  Offered strategies for altered taste Contact information provided   MONITORING, EVALUATION, GOAL: Patient will tolerate adequate calories and protein to maintain stable weights    Next Visit: No follow-up scheduled at this time, patient encouraged to contact with nutrition questions or concerns

## 2021-08-13 NOTE — Patient Instructions (Signed)
[facility]COL[facility]  Discharge Instructions: Thank you for choosing [facility] to provide your oncology and hematology care.   If you have a lab appointment with the Cancer Center, please go directly to the Cancer Center and check in at the registration area.   Wear comfortable clothing and clothing appropriate for easy access to any Portacath or PICC line.   We strive to give you quality time with your provider. You may need to reschedule your appointment if you arrive late (15 or more minutes).  Arriving late affects you and other patients whose appointments are after yours.  Also, if you miss three or more appointments without notifying the office, you may be dismissed from the clinic at the provider's discretion.      For prescription refill requests, have your pharmacy contact our office and allow 72 hours for refills to be completed.    Today you received the following chemotherapy and/or immunotherapy agents: etoposide.     To help prevent nausea and vomiting after your treatment, we encourage you to take your nausea medication as directed.  BELOW ARE SYMPTOMS THAT SHOULD BE REPORTED IMMEDIATELY: . *FEVER GREATER THAN 100.4 F (38 C) OR HIGHER . *CHILLS OR SWEATING . *NAUSEA AND VOMITING THAT IS NOT CONTROLLED WITH YOUR NAUSEA MEDICATION . *UNUSUAL SHORTNESS OF BREATH . *UNUSUAL BRUISING OR BLEEDING . *URINARY PROBLEMS (pain or burning when urinating, or frequent urination) . *BOWEL PROBLEMS (unusual diarrhea, constipation, pain near the anus) . TENDERNESS IN MOUTH AND THROAT WITH OR WITHOUT PRESENCE OF ULCERS (sore throat, sores in mouth, or a toothache) . UNUSUAL RASH, SWELLING OR PAIN  . UNUSUAL VAGINAL DISCHARGE OR ITCHING   Items with * indicate a potential emergency and should be followed up as soon as possible or go to the Emergency Department if any problems should occur.  Please show the CHEMOTHERAPY ALERT CARD or IMMUNOTHERAPY ALERT  CARD at check-in to the Emergency Department and triage nurse.  Should you have questions after your visit or need to cancel or reschedule your appointment, please contact [facility]  Dept: [phone]  and follow the prompts.  Office hours are 8:00 a.m. to 4:30 p.m. Monday - Friday. Please note that voicemails left after 4:00 p.m. may not be returned until the following business day.  We are closed weekends and major holidays. You have access to a nurse at all times for urgent questions. Please call the main number to the clinic Dept: [phone] and follow the prompts.   For any non-urgent questions, you may also contact your provider using MyChart. We now offer e-Visits for anyone 18 and older to request care online for non-urgent symptoms. For details visit [email or web address].   Also download the MyChart app! Go to the app store, search "MyChart", open the app, select [facility], and log in with your MyChart username and password.  Due to Covid, a mask is required upon entering the hospital/clinic. If you do not have a mask, one will be given to you upon arrival. For doctor visits, patients may have 1 support person aged 18 or older with them. For treatment visits, patients cannot have anyone with them due to current Covid guidelines and our immunocompromised population.   

## 2021-08-14 ENCOUNTER — Other Ambulatory Visit: Payer: Self-pay | Admitting: *Deleted

## 2021-08-14 ENCOUNTER — Other Ambulatory Visit (HOSPITAL_COMMUNITY): Payer: Self-pay

## 2021-08-14 ENCOUNTER — Inpatient Hospital Stay: Payer: Medicare HMO

## 2021-08-14 VITALS — BP 136/76 | HR 74 | Temp 97.6°F | Resp 17

## 2021-08-14 DIAGNOSIS — C8338 Diffuse large B-cell lymphoma, lymph nodes of multiple sites: Secondary | ICD-10-CM

## 2021-08-14 DIAGNOSIS — Z5112 Encounter for antineoplastic immunotherapy: Secondary | ICD-10-CM | POA: Diagnosis not present

## 2021-08-14 MED ORDER — HEPARIN SOD (PORK) LOCK FLUSH 100 UNIT/ML IV SOLN
250.0000 [IU] | Freq: Once | INTRAVENOUS | Status: AC | PRN
  Administered 2021-08-14: 250 [IU]

## 2021-08-14 MED ORDER — SODIUM CHLORIDE 0.9% FLUSH
3.0000 mL | INTRAVENOUS | Status: DC | PRN
  Administered 2021-08-14: 3 mL

## 2021-08-14 MED ORDER — HEPARIN SOD (PORK) LOCK FLUSH 100 UNIT/ML IV SOLN
INTRAVENOUS | 1 refills | Status: DC
  Filled 2021-08-14: qty 60, 42d supply, fill #0

## 2021-08-14 MED ORDER — SODIUM CHLORIDE 0.9 % IV SOLN
50.0000 mg/m2 | Freq: Once | INTRAVENOUS | Status: AC
  Administered 2021-08-14: 120 mg via INTRAVENOUS
  Filled 2021-08-14: qty 6

## 2021-08-14 MED ORDER — SODIUM CHLORIDE 0.9 % IV SOLN: Freq: Once | INTRAVENOUS | Status: AC

## 2021-08-14 MED ORDER — HEPARIN SODIUM FLUSH 100-0.9 UNIT/ML-% IV KIT: 500.0000 [IU] | PACK | INTRAVENOUS | 1 refills | Status: DC

## 2021-08-14 MED ORDER — SALINE FLUSH 0.9 % IV SOLN: 10.0000 mL | INTRAVENOUS | 1 refills | Status: DC

## 2021-08-14 MED ORDER — NORMAL SALINE FLUSH 0.9 % IV SOLN
INTRAVENOUS | 1 refills | Status: DC
  Filled 2021-08-14: qty 120, 42d supply, fill #0

## 2021-08-14 MED ORDER — PROCHLORPERAZINE MALEATE 10 MG PO TABS
10.0000 mg | ORAL_TABLET | Freq: Once | ORAL | Status: AC
  Administered 2021-08-14: 10 mg via ORAL
  Filled 2021-08-14: qty 1

## 2021-08-15 ENCOUNTER — Inpatient Hospital Stay: Payer: Medicare HMO

## 2021-08-17 ENCOUNTER — Other Ambulatory Visit: Payer: Self-pay

## 2021-08-17 ENCOUNTER — Other Ambulatory Visit (HOSPITAL_COMMUNITY): Payer: Self-pay

## 2021-08-17 ENCOUNTER — Inpatient Hospital Stay: Payer: Medicare HMO | Attending: Hematology and Oncology

## 2021-08-17 DIAGNOSIS — R600 Localized edema: Secondary | ICD-10-CM | POA: Diagnosis not present

## 2021-08-17 DIAGNOSIS — E1122 Type 2 diabetes mellitus with diabetic chronic kidney disease: Secondary | ICD-10-CM | POA: Diagnosis not present

## 2021-08-17 DIAGNOSIS — I129 Hypertensive chronic kidney disease with stage 1 through stage 4 chronic kidney disease, or unspecified chronic kidney disease: Secondary | ICD-10-CM | POA: Diagnosis not present

## 2021-08-17 DIAGNOSIS — Z5189 Encounter for other specified aftercare: Secondary | ICD-10-CM | POA: Insufficient documentation

## 2021-08-17 DIAGNOSIS — K5900 Constipation, unspecified: Secondary | ICD-10-CM | POA: Insufficient documentation

## 2021-08-17 DIAGNOSIS — C8333 Diffuse large B-cell lymphoma, intra-abdominal lymph nodes: Secondary | ICD-10-CM | POA: Diagnosis present

## 2021-08-17 DIAGNOSIS — I4891 Unspecified atrial fibrillation: Secondary | ICD-10-CM | POA: Insufficient documentation

## 2021-08-17 DIAGNOSIS — Z452 Encounter for adjustment and management of vascular access device: Secondary | ICD-10-CM | POA: Diagnosis not present

## 2021-08-17 DIAGNOSIS — T451X5D Adverse effect of antineoplastic and immunosuppressive drugs, subsequent encounter: Secondary | ICD-10-CM | POA: Insufficient documentation

## 2021-08-17 DIAGNOSIS — D6481 Anemia due to antineoplastic chemotherapy: Secondary | ICD-10-CM | POA: Insufficient documentation

## 2021-08-17 DIAGNOSIS — G4733 Obstructive sleep apnea (adult) (pediatric): Secondary | ICD-10-CM | POA: Insufficient documentation

## 2021-08-17 DIAGNOSIS — R319 Hematuria, unspecified: Secondary | ICD-10-CM | POA: Insufficient documentation

## 2021-08-17 DIAGNOSIS — C8338 Diffuse large B-cell lymphoma, lymph nodes of multiple sites: Secondary | ICD-10-CM

## 2021-08-17 DIAGNOSIS — Z5112 Encounter for antineoplastic immunotherapy: Secondary | ICD-10-CM | POA: Insufficient documentation

## 2021-08-17 DIAGNOSIS — Z5111 Encounter for antineoplastic chemotherapy: Secondary | ICD-10-CM | POA: Insufficient documentation

## 2021-08-17 DIAGNOSIS — Z7901 Long term (current) use of anticoagulants: Secondary | ICD-10-CM | POA: Insufficient documentation

## 2021-08-17 MED ORDER — PEGFILGRASTIM-CBQV 6 MG/0.6ML ~~LOC~~ SOSY
6.0000 mg | PREFILLED_SYRINGE | Freq: Once | SUBCUTANEOUS | Status: AC
  Administered 2021-08-17: 6 mg via SUBCUTANEOUS
  Filled 2021-08-17: qty 0.6

## 2021-08-17 MED ORDER — SODIUM CHLORIDE 0.9% FLUSH
10.0000 mL | Freq: Once | INTRAVENOUS | Status: AC
  Administered 2021-08-17: 10 mL

## 2021-08-17 MED ORDER — HEPARIN SOD (PORK) LOCK FLUSH 100 UNIT/ML IV SOLN
250.0000 [IU] | Freq: Once | INTRAVENOUS | Status: AC
  Administered 2021-08-17: 250 [IU]

## 2021-08-19 ENCOUNTER — Other Ambulatory Visit: Payer: Medicare HMO

## 2021-08-19 ENCOUNTER — Other Ambulatory Visit: Payer: Self-pay

## 2021-08-19 ENCOUNTER — Inpatient Hospital Stay: Payer: Medicare HMO

## 2021-08-19 DIAGNOSIS — C8333 Diffuse large B-cell lymphoma, intra-abdominal lymph nodes: Secondary | ICD-10-CM

## 2021-08-19 DIAGNOSIS — Z5112 Encounter for antineoplastic immunotherapy: Secondary | ICD-10-CM | POA: Diagnosis not present

## 2021-08-19 DIAGNOSIS — Z452 Encounter for adjustment and management of vascular access device: Secondary | ICD-10-CM

## 2021-08-19 LAB — CBC WITH DIFFERENTIAL (CANCER CENTER ONLY)
Abs Immature Granulocytes: 0.14 10*3/uL — ABNORMAL HIGH (ref 0.00–0.07)
Band Neutrophils: 8 %
Basophils Absolute: 0.1 10*3/uL (ref 0.0–0.1)
Basophils Relative: 1 %
Eosinophils Absolute: 0 10*3/uL (ref 0.0–0.5)
Eosinophils Relative: 0 %
HCT: 23.9 % — ABNORMAL LOW (ref 39.0–52.0)
Hemoglobin: 7.8 g/dL — ABNORMAL LOW (ref 13.0–17.0)
Immature Granulocytes: 22 %
Lymphocytes Relative: 15 %
Lymphs Abs: 0.7 10*3/uL (ref 0.7–4.0)
MCH: 25.9 pg — ABNORMAL LOW (ref 26.0–34.0)
MCHC: 32.6 g/dL (ref 30.0–36.0)
MCV: 79.4 fL — ABNORMAL LOW (ref 80.0–100.0)
Metamyelocytes Relative: 3 %
Monocytes Absolute: 0 10*3/uL — ABNORMAL LOW (ref 0.1–1.0)
Monocytes Relative: 1 %
Neutro Abs: 3.4 10*3/uL (ref 1.7–7.7)
Neutrophils Relative %: 72 %
Platelet Count: 163 10*3/uL (ref 150–400)
RBC: 3.01 MIL/uL — ABNORMAL LOW (ref 4.22–5.81)
RDW: 17.1 % — ABNORMAL HIGH (ref 11.5–15.5)
WBC Count: 4.8 10*3/uL (ref 4.0–10.5)
nRBC: 0 % (ref 0.0–0.2)

## 2021-08-19 LAB — CMP (CANCER CENTER ONLY)
ALT: 34 U/L (ref 0–44)
AST: 20 U/L (ref 15–41)
Albumin: 3.1 g/dL — ABNORMAL LOW (ref 3.5–5.0)
Alkaline Phosphatase: 91 U/L (ref 38–126)
Anion gap: 11 (ref 5–15)
BUN: 29 mg/dL — ABNORMAL HIGH (ref 8–23)
CO2: 22 mmol/L (ref 22–32)
Calcium: 8.7 mg/dL — ABNORMAL LOW (ref 8.9–10.3)
Chloride: 106 mmol/L (ref 98–111)
Creatinine: 1.52 mg/dL — ABNORMAL HIGH (ref 0.61–1.24)
GFR, Estimated: 48 mL/min — ABNORMAL LOW (ref >60)
Glucose, Bld: 356 mg/dL — ABNORMAL HIGH (ref 70–99)
Potassium: 4.5 mmol/L (ref 3.5–5.1)
Sodium: 139 mmol/L (ref 135–145)
Total Bilirubin: 0.5 mg/dL (ref 0.3–1.2)
Total Protein: 5 g/dL — ABNORMAL LOW (ref 6.5–8.1)

## 2021-08-19 MED ORDER — SODIUM CHLORIDE 0.9% FLUSH: 10.0000 mL | Freq: Once | INTRAVENOUS | Status: DC

## 2021-08-19 MED ORDER — HEPARIN SOD (PORK) LOCK FLUSH 100 UNIT/ML IV SOLN: 500.0000 [IU] | Freq: Once | INTRAVENOUS | Status: DC

## 2021-08-24 ENCOUNTER — Other Ambulatory Visit (HOSPITAL_COMMUNITY): Payer: Self-pay

## 2021-08-25 ENCOUNTER — Other Ambulatory Visit (HOSPITAL_COMMUNITY): Payer: Self-pay

## 2021-08-26 ENCOUNTER — Inpatient Hospital Stay: Payer: Medicare HMO

## 2021-09-02 ENCOUNTER — Inpatient Hospital Stay (HOSPITAL_BASED_OUTPATIENT_CLINIC_OR_DEPARTMENT_OTHER): Payer: Medicare HMO | Admitting: Hematology and Oncology

## 2021-09-02 ENCOUNTER — Inpatient Hospital Stay: Payer: Medicare HMO

## 2021-09-02 ENCOUNTER — Other Ambulatory Visit: Payer: Self-pay

## 2021-09-02 VITALS — BP 104/58 | HR 69 | Temp 97.8°F | Resp 18

## 2021-09-02 VITALS — BP 117/62 | HR 71 | Temp 97.7°F | Resp 18 | Wt 250.7 lb

## 2021-09-02 DIAGNOSIS — C8333 Diffuse large B-cell lymphoma, intra-abdominal lymph nodes: Secondary | ICD-10-CM

## 2021-09-02 DIAGNOSIS — R591 Generalized enlarged lymph nodes: Secondary | ICD-10-CM

## 2021-09-02 DIAGNOSIS — Z452 Encounter for adjustment and management of vascular access device: Secondary | ICD-10-CM

## 2021-09-02 DIAGNOSIS — Z5112 Encounter for antineoplastic immunotherapy: Secondary | ICD-10-CM | POA: Diagnosis not present

## 2021-09-02 DIAGNOSIS — C8338 Diffuse large B-cell lymphoma, lymph nodes of multiple sites: Secondary | ICD-10-CM

## 2021-09-02 LAB — CBC WITH DIFFERENTIAL (CANCER CENTER ONLY)
Abs Immature Granulocytes: 0.13 10*3/uL — ABNORMAL HIGH (ref 0.00–0.07)
Basophils Absolute: 0.1 10*3/uL (ref 0.0–0.1)
Basophils Relative: 1 %
Eosinophils Absolute: 0.2 10*3/uL (ref 0.0–0.5)
Eosinophils Relative: 2 %
HCT: 26 % — ABNORMAL LOW (ref 39.0–52.0)
Hemoglobin: 8.5 g/dL — ABNORMAL LOW (ref 13.0–17.0)
Immature Granulocytes: 2 %
Lymphocytes Relative: 17 %
Lymphs Abs: 1.3 10*3/uL (ref 0.7–4.0)
MCH: 25.9 pg — ABNORMAL LOW (ref 26.0–34.0)
MCHC: 32.7 g/dL (ref 30.0–36.0)
MCV: 79.3 fL — ABNORMAL LOW (ref 80.0–100.0)
Monocytes Absolute: 0.8 10*3/uL (ref 0.1–1.0)
Monocytes Relative: 11 %
Neutro Abs: 4.9 10*3/uL (ref 1.7–7.7)
Neutrophils Relative %: 67 %
Platelet Count: 222 10*3/uL (ref 150–400)
RBC: 3.28 MIL/uL — ABNORMAL LOW (ref 4.22–5.81)
RDW: 18 % — ABNORMAL HIGH (ref 11.5–15.5)
WBC Count: 7.4 10*3/uL (ref 4.0–10.5)
nRBC: 0 % (ref 0.0–0.2)

## 2021-09-02 LAB — CMP (CANCER CENTER ONLY)
ALT: 35 U/L (ref 0–44)
AST: 28 U/L (ref 15–41)
Albumin: 3.1 g/dL — ABNORMAL LOW (ref 3.5–5.0)
Alkaline Phosphatase: 88 U/L (ref 38–126)
Anion gap: 12 (ref 5–15)
BUN: 22 mg/dL (ref 8–23)
CO2: 22 mmol/L (ref 22–32)
Calcium: 9 mg/dL (ref 8.9–10.3)
Chloride: 108 mmol/L (ref 98–111)
Creatinine: 1.43 mg/dL — ABNORMAL HIGH (ref 0.61–1.24)
GFR, Estimated: 51 mL/min — ABNORMAL LOW (ref >60)
Glucose, Bld: 227 mg/dL — ABNORMAL HIGH (ref 70–99)
Potassium: 4.4 mmol/L (ref 3.5–5.1)
Sodium: 142 mmol/L (ref 135–145)
Total Bilirubin: 0.4 mg/dL (ref 0.3–1.2)
Total Protein: 5.3 g/dL — ABNORMAL LOW (ref 6.5–8.1)

## 2021-09-02 LAB — LACTATE DEHYDROGENASE: LDH: 286 U/L — ABNORMAL HIGH (ref 98–192)

## 2021-09-02 LAB — URIC ACID: Uric Acid, Serum: 4 mg/dL (ref 3.7–8.6)

## 2021-09-02 MED ORDER — SODIUM CHLORIDE 0.9 % IV SOLN
50.0000 mg/m2 | Freq: Once | INTRAVENOUS | Status: AC
  Administered 2021-09-02: 120 mg via INTRAVENOUS
  Filled 2021-09-02: qty 6

## 2021-09-02 MED ORDER — SODIUM CHLORIDE 0.9% FLUSH
10.0000 mL | Freq: Once | INTRAVENOUS | Status: AC
  Administered 2021-09-02: 10 mL

## 2021-09-02 MED ORDER — RITUXIMAB-PVVR CHEMO 500 MG/50ML IV SOLN
375.0000 mg/m2 | Freq: Once | INTRAVENOUS | Status: AC
  Administered 2021-09-02: 900 mg via INTRAVENOUS
  Filled 2021-09-02: qty 50

## 2021-09-02 MED ORDER — PALONOSETRON HCL INJECTION 0.25 MG/5ML
0.2500 mg | Freq: Once | INTRAVENOUS | Status: AC
  Administered 2021-09-02: 0.25 mg via INTRAVENOUS
  Filled 2021-09-02: qty 5

## 2021-09-02 MED ORDER — SODIUM CHLORIDE 0.9% FLUSH
10.0000 mL | INTRAVENOUS | Status: DC | PRN
  Administered 2021-09-02: 10 mL

## 2021-09-02 MED ORDER — SODIUM CHLORIDE 0.9 % IV SOLN: Freq: Once | INTRAVENOUS | Status: AC

## 2021-09-02 MED ORDER — SODIUM CHLORIDE 0.9 % IV SOLN
10.0000 mg | Freq: Once | INTRAVENOUS | Status: AC
  Administered 2021-09-02: 10 mg via INTRAVENOUS
  Filled 2021-09-02: qty 10

## 2021-09-02 MED ORDER — HEPARIN SOD (PORK) LOCK FLUSH 100 UNIT/ML IV SOLN
250.0000 [IU] | Freq: Once | INTRAVENOUS | Status: AC | PRN
  Administered 2021-09-02: 250 [IU]

## 2021-09-02 MED ORDER — ACETAMINOPHEN 325 MG PO TABS
650.0000 mg | ORAL_TABLET | Freq: Once | ORAL | Status: AC
  Administered 2021-09-02: 650 mg via ORAL
  Filled 2021-09-02: qty 2

## 2021-09-02 MED ORDER — VINCRISTINE SULFATE CHEMO INJECTION 1 MG/ML
2.0000 mg | Freq: Once | INTRAVENOUS | Status: AC
  Administered 2021-09-02: 2 mg via INTRAVENOUS
  Filled 2021-09-02: qty 2

## 2021-09-02 MED ORDER — SODIUM CHLORIDE 0.9 % IV SOLN
750.0000 mg/m2 | Freq: Once | INTRAVENOUS | Status: AC
  Administered 2021-09-02: 1740 mg via INTRAVENOUS
  Filled 2021-09-02: qty 87

## 2021-09-02 MED ORDER — DIPHENHYDRAMINE HCL 25 MG PO CAPS
50.0000 mg | ORAL_CAPSULE | Freq: Once | ORAL | Status: AC
  Administered 2021-09-02: 50 mg via ORAL
  Filled 2021-09-02: qty 2

## 2021-09-02 MED ORDER — PREDNISONE 20 MG PO TABS
60.0000 mg | ORAL_TABLET | Freq: Once | ORAL | Status: AC
  Administered 2021-09-02: 60 mg via ORAL
  Filled 2021-09-02: qty 3

## 2021-09-02 MED ORDER — HEPARIN SOD (PORK) LOCK FLUSH 100 UNIT/ML IV SOLN
500.0000 [IU] | Freq: Once | INTRAVENOUS | Status: AC
  Administered 2021-09-02: 500 [IU]

## 2021-09-02 NOTE — Progress Notes (Signed)
Patient signed up to utilize [facility]. Verified all information with patient via interpretor and rider waiver signed. All information sent to [employer] Patient educated on use of transportation for future use.

## 2021-09-02 NOTE — Progress Notes (Signed)
[name]   [phone]  PROGRESS NOTE  Patient Care Team: Stanley Geralds, MD as PCP - General (Family Medicine)  Hematological/Oncological History # Diffuse Large B Cell Lymphoma, Stage III/IV 04/30/2021: presented with RLQ abdominal pain. CT abdomen/pelvis shows pathologic mesenteric, retroperitoneal, and pelvic adenopathy, as described above. Moderate splenomegaly. Lobulated retroperitoneal soft tissue masses in the region of the right ureteropelvic junction likely resulting in mild right hydronephrosis secondary to extrinsic mass effect as well as within the right retroperitoneum 05/04/2021: needle core biopsy of right retroperitoneal lymph node shows large B cell lymphoma. 05/13/2021: establish care with Stanley. Lorenso [name] 05/23/2021-06/01/2021: admitted with urosepsis. Concern for endocarditis, started on 6 week course abx via PICC line.   06/09/2021-06/11/2021: Cycle 1 of R-CEOP 07/01/2021-07/03/2021: Cycle 2 of R-CEOP 07/22/2021-07/24/2021: Cycle 3 of R-CEOP 08/12/2021-08/14/2021: Cycle 4 of R-CEOP 09/02/2021-09/04/2021: Cycle 5 of R-CEOP  Interval History:  [name] 75 y.o. male with medical history significant for stage III/IV diffuse large B-cell lymphoma presents for a follow up visit. The patient's last visit was on 08/12/2021. In the interim since the last visit he completed Cycle 4 of R-CEOP.   On exam today, [name] reports he has been having some difficulty with stuffiness of his nose.  He reports that when he goes to sleep he has difficulty staying asleep.  He notes he does well 2 to 3 days after chemotherapy but after 3 days has much more difficulty.  He denies any mucus or drainage from his nose and reports that nasal spray is not helping much.  His appetite has been good and his weight has been stable.  He denies any fevers, chills, night sweats, shortness of breath, chest pain or cough. He has no other complaints.   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 per Stanley Lucianne Lei [name] note 06-18-2021   Hematuria 06/18/2021   Hypertension    Sleep apnea    uses cpap   Uses walker 07/24/2021   prn   UTI (urinary tract infection)    finished antibiotics on 07-08-2021    SURGICAL HISTORY: Past Surgical History:  Procedure Laterality Date   CARDIAC CATHETERIZATION     CYSTOSCOPY W/ URETERAL STENT PLACEMENT Right 07/28/2021   Procedure: CYSTOSCOPY WITH RETROGRADE PYELOGRAM/URETERAL STENT EXCHANGE;  Surgeon: Stanley Haggard, MD;  Location: [facility];  Service: Urology;  Laterality: Right;  [place], URETEROSCOPY AND STENT PLACEMENT Right 05/01/2021   Procedure: CYSTOSCOPY WITH RIGHT  RETROGRADE PYELOGRAM,  AND RIGHT STENT PLACEMENT;  Surgeon: Stanley Aloe, MD;  Location: [facility];  Service: Urology;  Laterality: Right;   EYE SURGERY     INJECTION OF SILICONE OIL Left 84/13/2440   Procedure: INJECTION OF SILICONE OIL;  Surgeon: Stanley Stalls, MD;  Location: [place];  Service: Ophthalmology;  Laterality: Left;   INJECTION OF SILICONE OIL Left 11/17/7251   Procedure: INJECTION OF SILICONE OIL;  Surgeon: Stanley Stalls, MD;  Location: [facility];  Service: Ophthalmology;  Laterality: Left;   JOINT REPLACEMENT Right    hip   LASER PHOTO ABLATION Left 12/25/2020  Procedure: LASER PHOTO ABLATION;  Surgeon: Stanley Stalls, MD;  Location: [place];  Service: Ophthalmology;  Laterality: Left;   PARS PLANA VITRECTOMY Left 11/10/2020   Procedure: PARS PLANA VITRECTOMY WITH 25 GAUGE;  Surgeon: Stanley Stalls, MD;  Location: [place];  Service: Ophthalmology;  Laterality: Left;   PARS PLANA  VITRECTOMY Left 12/25/2020   Procedure: PARS PLANA VITRECTOMY WITH 25 GAUGE IN LEFT EYE;  Surgeon: Stanley Stalls, MD;  Location: [place];  Service: Ophthalmology;  Laterality: Left;   PERFLUORONE INJECTION Left 12/25/2020   Procedure: PERFLUORONE INJECTION;  Surgeon: Stanley Stalls, MD;  Location: [place];  Service: Ophthalmology;  Laterality: Left;   PHOTOCOAGULATION WITH LASER Left 11/10/2020   Procedure: PHOTOCOAGULATION WITH LASER;  Surgeon: Stanley Stalls, MD;  Location: [place];  Service: Ophthalmology;  Laterality: Left;   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   Procedure: RETINECTOMY LEFT EYE;  Surgeon: Stanley Stalls, MD;  Location: [place];  Service: Ophthalmology;  Laterality: Left;   SILICON OIL REMOVAL Left 8/67/5449   Procedure: SILICONE OIL REMOVAL;  Surgeon: Stanley Stalls, MD;  Location: [place];  Service: Ophthalmology;  Laterality: Left;   TEE WITHOUT CARDIOVERSION N/A 05/29/2021   Procedure: TRANSESOPHAGEAL ECHOCARDIOGRAM (TEE);  Surgeon: Stanley Lean, MD;  Location[facility];  Service: Cardiovascular;  Laterality: N/A;    SOCIAL HISTORY: Social History   Socioeconomic History   Marital status: Married    Spouse name: Not on file   Number of children: Not on file   Years of education: Not on file   Highest education level: Not on file  Occupational History   Not on file  Tobacco Use   Smoking status: Never   Smokeless tobacco: Never  Vaping Use   Vaping Use: Never used  Substance and Sexual Activity   Alcohol use: Not Currently   Drug use: Never   Sexual activity: Yes  Other Topics Concern   Not on file  Social History Narrative   Not on file   Social Determinants of Health   Financial Resource Strain: Not on file  Food Insecurity: Not on file  Transportation Needs: Not on file  Physical Activity: Not on file  Stress: Not on file  Social Connections: Not on file  Intimate Partner Violence: Not on file    FAMILY  HISTORY: Family History  Problem Relation Age of Onset   CAD Other     ALLERGIES:  has no allergies on file.  MEDICATIONS:  Current Outpatient Medications  Medication Sig Dispense Refill   allopurinol (ZYLOPRIM) 300 MG tablet Take 1 tablet (300 mg total) by mouth daily. 90 tablet 1   amiodarone (PACERONE) 200 MG tablet Take 200 mg by mouth every morning.     apixaban (ELIQUIS) 5 MG TABS tablet Take 5 mg by mouth 2 (two) times daily.     atorvastatin (LIPITOR) 20 MG tablet Take 20 mg by mouth at bedtime.     finasteride (PROSCAR) 5 MG tablet Take 5 mg by mouth at bedtime.     furosemide (LASIX) 20 MG tablet Take 1 tablet (20 mg total) by mouth daily. 30 tablet 1   glipiZIDE (GLUCOTROL) 10 MG tablet Take 10 mg by mouth 2 (two) times daily.     Heparin Sodium Flush 100-0.9 UNIT/ML-% KIT Inject 500 Units into the vein 2 (two) times a week. Flush PICC with 250 units (1/2 syringe) 2 x weekly   discard remaining 12 kit 1   insulin glargine (LANTUS) 100 unit/mL  SOPN Inject 34 Units into the skin at bedtime.     isosorbide mononitrate (IMDUR) 30 MG 24 hr tablet Take 30 mg by mouth every morning.     metFORMIN (GLUCOPHAGE) 1000 MG tablet Take 1,000 mg by mouth 2 (two) times daily.     predniSONE (DELTASONE) 20 MG tablet Take 3 tablets (60 mg total) by mouth daily with breakfast. Take 3 pills per day starting on Day 1 of chemotherapy, for 5 total days. 15 tablet 5   acetaminophen (TYLENOL) 650 MG CR tablet Take 650 mg by mouth every 8 (eight) hours as needed for pain or fever (headache). (Patient not taking: Reported on 09/02/2021)     amLODipine (NORVASC) 10 MG tablet Take 1 tablet by mouth once daily 30 tablet 0   clopidogrel (PLAVIX) 75 MG tablet Take 75 mg by mouth daily. (Patient not taking: Reported on 09/02/2021)     metoprolol tartrate (LOPRESSOR) 50 MG tablet Take 1 tablet (50 mg total) by mouth 2 (two) times daily. 60 tablet 1   nitroGLYCERIN (NITROSTAT) 0.3 MG SL tablet Place 0.3 mg under  the tongue every 5 (five) minutes x 3 doses as needed for chest pain. (Patient not taking: Reported on 09/02/2021)     oxyCODONE (OXY IR/ROXICODONE) 5 MG immediate release tablet Take 1 tablet (5 mg total) by mouth every 4 (four) hours as needed for severe pain. (Patient not taking: No sig reported) 20 tablet 0   prochlorperazine (COMPAZINE) 10 MG tablet Take 1 tablet (10 mg total) by mouth every 6 (six) hours as needed for nausea or vomiting. (Patient not taking: Reported on 09/02/2021) 30 tablet 0   Sodium Chloride Flush (NORMAL SALINE FLUSH) 0.9 % SOLN Flush PICC line with 45m 1-2 times a week as directed (Patient not taking: Reported on 09/02/2021) 120 mL 1   tenofovir (VIREAD) 300 MG tablet Take 1 tablet (300 mg total) by mouth every other day. (Patient not taking: Reported on 09/02/2021) 15 tablet 11   No current facility-administered medications for this visit.   Facility-Administered Medications Ordered in Other Visits  Medication Dose Route Frequency Provider Last Rate Last Admin   prochlorperazine (COMPAZINE) 10 MG tablet            sodium chloride flush (NS) 0.9 % injection 10 mL  10 mL Intracatheter PRN DLedell PeoplesIV, MD       sodium chloride flush (NS) 0.9 % injection 10 mL  10 mL Intravenous Once DOrson Slick MD        REVIEW OF SYSTEMS:   Constitutional: ( - ) fevers, ( - )  chills , ( - ) night sweats Eyes: ( - ) blurriness of vision, ( - ) double vision, ( - ) watery eyes Ears, nose, mouth, throat, and face: ( - ) mucositis, ( - ) sore throat Respiratory: ( - ) cough, ( - ) dyspnea, ( - ) wheezes Cardiovascular: ( - ) palpitation, ( - ) chest discomfort, ( +) lower extremity swelling Gastrointestinal:  ( - ) nausea, ( - ) heartburn, ( - ) change in bowel habits Skin: ( - ) abnormal skin rashes Lymphatics: ( - ) new lymphadenopathy, ( - ) easy bruising Neurological: ( - ) numbness, ( - ) tingling, ( - ) new weaknesses Behavioral/Psych: ( - ) mood change, ( - ) new  changes  All other systems were reviewed with the patient and are negative.  PHYSICAL EXAMINATION: ECOG PERFORMANCE STATUS: 1 - Symptomatic but  completely ambulatory  Vitals:   09/02/21 0857  BP: 117/62  Pulse: 71  Resp: 18  Temp: 97.7 F (36.5 C)  SpO2: 100%     Filed Weights   09/02/21 0857  Weight: 250 lb 11.2 oz (113.7 kg)    GENERAL: Well-appearing elderly [place] male, alert, no distress and comfortable SKIN: skin color, texture, turgor are normal, no rashes or significant lesions EYES: conjunctiva are pink and non-injected, sclera clear NECK: supple, non-tender LYMPH:  no palpable lymphadenopathy in the cervical, axillary or inguinal LUNGS: clear to auscultation and percussion with normal breathing effort HEART: regular rate & rhythm and no murmurs. 1+ pitting edema bilaterally in the lower extremities below the knee. PSYCH: alert & oriented x 3, fluent speech NEURO: no focal motor/sensory deficits  LABORATORY DATA:  I have reviewed the data as listed CBC Latest Ref Rng & Units 09/02/2021 08/19/2021 08/12/2021  WBC 4.0 - 10.5 K/uL 7.4 4.8 10.1  Hemoglobin 13.0 - 17.0 g/dL 8.5(L) 7.8(L) 8.9(L)  Hematocrit 39.0 - 52.0 % 26.0(L) 23.9(L) 27.1(L)  Platelets 150 - 400 K/uL 222 163 227    CMP Latest Ref Rng & Units 09/02/2021 08/19/2021 08/12/2021  Glucose 70 - 99 mg/dL 227(H) 356(H) 322(H)  BUN 8 - 23 mg/dL 22 29(H) 23  Creatinine 0.61 - 1.24 mg/dL 1.43(H) 1.52(H) 1.61(H)  Sodium 135 - 145 mmol/L 142 139 139  Potassium 3.5 - 5.1 mmol/L 4.4 4.5 4.2  Chloride 98 - 111 mmol/L 108 106 105  CO2 22 - 32 mmol/L '22 22 22  ' Calcium 8.9 - 10.3 mg/dL 9.0 8.7(L) 8.8(L)  Total Protein 6.5 - 8.1 g/dL 5.3(L) 5.0(L) 5.2(L)  Total Bilirubin 0.3 - 1.2 mg/dL 0.4 0.5 0.8  Alkaline Phos 38 - 126 U/L 88 91 105  AST 15 - 41 U/L '28 20 29  ' ALT 0 - 44 U/L 35 34 42     RADIOGRAPHIC STUDIES: I have personally reviewed the radiological images as listed and agreed with the findings in the  report: PET CT scan findings with concern for liver involvement, and lymphadenopathy that is FDG avid on both sides of the diaphragm.  Findings are most consistent with a stage III/IV diffuse large B-cell lymphoma.  No results found.  ASSESSMENT & PLAN [name] 75 y.o. male with medical history significant for stage III/IV diffuse large B-cell lymphoma presents for a follow up visit.    After review of the labs, review of the records, and discussion with the patient the patients findings are most consistent with advanced stage diffuse large B-cell lymphoma.  PET scan confirmed widespread disease in the neck, chest, abdomen and pelvis. Additionally, there is evidence of splenomegaly and a 5.4 cm soft tissue lesion along the right lower kidney. Patient does have a cardiac history significant for atrial fibrillation on amiodarone and therefore we may need to consider dropping anthracyclines from his regimen. The treatment consists of R-CEOP based regimen moving forward.    DLBCL IPI Score: 2 points, good prognosis. 80% progression free survival    # Diffuse Large B Cell Lymphoma, ABC subtype. Stage III/IV -- Findings at this time are most consistent with stage III/IV diffuse large B-cell lymphoma. --Treatment consists R-CEOP chemotherapy given his cardiac issues and mildly reduced EF(he is on amiodarone for atrial fibrillation) --Started Cycle 1 on 06/09/2021 --Due to neutropenia after Cycle 1, added G-CSF on Day 4.  Plan: --today is Cycle 5 Day 1 --patient Hgb is 8.5, will arrange for weekly labs and transfusion support  as needed --PET scan before next visit.  --Return in 3 weeks for Cycle 6 Day 1.   #Chemotherapy Induced Anemia --Hgb 8.5 today --will proceed with treatment today, no plan for blood transfusion --continue weekly labs --continue to monitor   #Supportive Care -- chemotherapy education complete -- port placement to be scheduled after his PICC is removed. We will use  the PICC for now.  -- zofran 50m q8H PRN and compazine 128mPO q6H for nausea -- allopurinol 30032mO daily for TLS prophylaxis -- EMLA cream for port -- no pain medication required at this time.   #Hematuria, improved --Likely secondary to anticoagulation (on Eliquis and Plavix), soft tissue lesion along the medial right lower kidney s/p ureteral stent placement. --Hgb is stable at 8.5.  --patient holding Plavix, continuing eliquis.   #Lower extremity edema: --Patient was on HCTZ but was discontiued when hospitalized from AKI in the setting of R hydronephrosis due to retroperitoneal adenopathy causing mass-efect.  --Creatinine is back to baseline.  --continue lasix therapy as prescribed.   No orders of the defined types were placed in this encounter.   All questions were answered. The patient knows to call the clinic with any problems, questions or concerns.  A total of more than 30 minutes were spent on this encounter with face-to-face time and non-face-to-face time, including preparing to see the patient, ordering tests and/or medications, counseling the patient and coordination of care as outlined above.   JohLedell PeoplesD Department of Hematology/Oncology [facility] [place]: [phone]: [phone]: [email or web address]   09/08/2021 12:19 PM

## 2021-09-02 NOTE — Progress Notes (Signed)
Orders received to give prednisone 60 mg orally in clinic today and ensure patient has prescription at home for days 2-5 (nursing will confirm this)  Orders entered as above.  T.O. Dr Genia Hotter, PharmD

## 2021-09-02 NOTE — Progress Notes (Signed)
Patient had not had picc line changed due to patient not at previous appointment due to sickness Picc line changed today .

## 2021-09-02 NOTE — Patient Instructions (Addendum)
[facility]  *You have been enrolled in [employer] - For all patient transportation during the week, continue to call [phone], Monday - Friday, 7:00 a.m. - 6:00 p.m. *  Discharge Instructions: Thank you for choosing [facility] to provide your oncology and hematology care.   If you have a lab appointment with the [facility], please go directly to the [facility] and check in at the registration area.   Wear comfortable clothing and clothing appropriate for easy access to any Portacath or PICC line.   We strive to give you quality time with your provider. You may need to reschedule your appointment if you arrive late (15 or more minutes).  Arriving late affects you and other patients whose appointments are after yours.  Also, if you miss three or more appointments without notifying the office, you may be dismissed from the clinic at the provider's discretion.      For prescription refill requests, have your pharmacy contact our office and allow 72 hours for refills to be completed.    Today you received the following chemotherapy and/or immunotherapy agents: vincristine, cyclophosphamide, etoposide, rituximab      To help prevent nausea and vomiting after your treatment, we encourage you to take your nausea medication as directed.  BELOW ARE SYMPTOMS THAT SHOULD BE REPORTED IMMEDIATELY: *FEVER GREATER THAN 100.4 F (38 C) OR HIGHER *CHILLS OR SWEATING *NAUSEA AND VOMITING THAT IS NOT CONTROLLED WITH YOUR NAUSEA MEDICATION *UNUSUAL SHORTNESS OF BREATH *UNUSUAL BRUISING OR BLEEDING *URINARY PROBLEMS (pain or burning when urinating, or frequent urination) *BOWEL PROBLEMS (unusual diarrhea, constipation, pain near the anus) TENDERNESS IN MOUTH AND THROAT WITH OR WITHOUT PRESENCE OF ULCERS (sore throat, sores in mouth, or a toothache) UNUSUAL RASH, SWELLING OR PAIN  UNUSUAL VAGINAL DISCHARGE OR ITCHING   Items  with * indicate a potential emergency and should be followed up as soon as possible or go to the Emergency Department if any problems should occur.  Please show the CHEMOTHERAPY ALERT CARD or IMMUNOTHERAPY ALERT CARD at check-in to the Emergency Department and triage nurse.  Should you have questions after your visit or need to cancel or reschedule your appointment, please contact [facility]  Dept: [phone]  and follow the prompts.  Office hours are 8:00 a.m. to 4:30 p.m. Monday - Friday. Please note that voicemails left after 4:00 p.m. may not be returned until the following business day.  We are closed weekends and major holidays. You have access to a nurse at all times for urgent questions. Please call the main number to the clinic Dept: [phone] and follow the prompts.   For any non-urgent questions, you may also contact your provider using MyChart. We now offer e-Visits for anyone 67 and older to request care online for non-urgent symptoms. For details visit [email or web address].   Also download the MyChart app! Go to the app store, search "MyChart", open the app, select [facility], and log in with your MyChart username and password.  Due to Covid, a mask is required upon entering the hospital/clinic. If you do not have a mask, one will be given to you upon arrival. For doctor visits, patients may have 1 support person aged 71 or older with them. For treatment visits, patients cannot have anyone with them due to current Covid guidelines and our immunocompromised population.

## 2021-09-03 ENCOUNTER — Inpatient Hospital Stay: Payer: Medicare HMO

## 2021-09-03 VITALS — BP 121/92 | HR 80 | Temp 97.7°F | Resp 18

## 2021-09-03 DIAGNOSIS — C8338 Diffuse large B-cell lymphoma, lymph nodes of multiple sites: Secondary | ICD-10-CM

## 2021-09-03 DIAGNOSIS — Z5112 Encounter for antineoplastic immunotherapy: Secondary | ICD-10-CM | POA: Diagnosis not present

## 2021-09-03 MED ORDER — SODIUM CHLORIDE 0.9% FLUSH
10.0000 mL | INTRAVENOUS | Status: DC | PRN
  Administered 2021-09-03: 10 mL

## 2021-09-03 MED ORDER — HEPARIN SOD (PORK) LOCK FLUSH 100 UNIT/ML IV SOLN
250.0000 [IU] | Freq: Once | INTRAVENOUS | Status: AC | PRN
  Administered 2021-09-03: 250 [IU]

## 2021-09-03 MED ORDER — SODIUM CHLORIDE 0.9 % IV SOLN
50.0000 mg/m2 | Freq: Once | INTRAVENOUS | Status: AC
  Administered 2021-09-03: 120 mg via INTRAVENOUS
  Filled 2021-09-03: qty 6

## 2021-09-03 MED ORDER — SODIUM CHLORIDE 0.9 % IV SOLN: Freq: Once | INTRAVENOUS | Status: AC

## 2021-09-03 MED ORDER — PROCHLORPERAZINE MALEATE 10 MG PO TABS
10.0000 mg | ORAL_TABLET | Freq: Once | ORAL | Status: AC
  Administered 2021-09-03: 10 mg via ORAL
  Filled 2021-09-03: qty 1

## 2021-09-03 NOTE — Patient Instructions (Signed)
[facility]  Discharge Instructions: Thank you for choosing [facility] to provide your oncology and hematology care.   If you have a lab appointment with the Cancer Center, please go directly to the Cancer Center and check in at the registration area.   Wear comfortable clothing and clothing appropriate for easy access to any Portacath or PICC line.   We strive to give you quality time with your provider. You may need to reschedule your appointment if you arrive late (15 or more minutes).  Arriving late affects you and other patients whose appointments are after yours.  Also, if you miss three or more appointments without notifying the office, you may be dismissed from the clinic at the provider's discretion.      For prescription refill requests, have your pharmacy contact our office and allow 72 hours for refills to be completed.    Today you received the following chemotherapy and/or immunotherapy agents: etoposide.     To help prevent nausea and vomiting after your treatment, we encourage you to take your nausea medication as directed.  BELOW ARE SYMPTOMS THAT SHOULD BE REPORTED IMMEDIATELY: . *FEVER GREATER THAN 100.4 F (38 C) OR HIGHER . *CHILLS OR SWEATING . *NAUSEA AND VOMITING THAT IS NOT CONTROLLED WITH YOUR NAUSEA MEDICATION . *UNUSUAL SHORTNESS OF BREATH . *UNUSUAL BRUISING OR BLEEDING . *URINARY PROBLEMS (pain or burning when urinating, or frequent urination) . *BOWEL PROBLEMS (unusual diarrhea, constipation, pain near the anus) . TENDERNESS IN MOUTH AND THROAT WITH OR WITHOUT PRESENCE OF ULCERS (sore throat, sores in mouth, or a toothache) . UNUSUAL RASH, SWELLING OR PAIN  . UNUSUAL VAGINAL DISCHARGE OR ITCHING   Items with * indicate a potential emergency and should be followed up as soon as possible or go to the Emergency Department if any problems should occur.  Please show the CHEMOTHERAPY ALERT CARD or IMMUNOTHERAPY ALERT  CARD at check-in to the Emergency Department and triage nurse.  Should you have questions after your visit or need to cancel or reschedule your appointment, please contact [facility]  Dept: [phone]  and follow the prompts.  Office hours are 8:00 a.m. to 4:30 p.m. Monday - Friday. Please note that voicemails left after 4:00 p.m. may not be returned until the following business day.  We are closed weekends and major holidays. You have access to a nurse at all times for urgent questions. Please call the main number to the clinic Dept: [phone] and follow the prompts.   For any non-urgent questions, you may also contact your provider using MyChart. We now offer e-Visits for anyone 18 and older to request care online for non-urgent symptoms. For details visit [email or web address].   Also download the MyChart app! Go to the app store, search "MyChart", open the app, select [facility], and log in with your MyChart username and password.  Due to Covid, a mask is required upon entering the hospital/clinic. If you do not have a mask, one will be given to you upon arrival. For doctor visits, patients may have 1 support person aged 18 or older with them. For treatment visits, patients cannot have anyone with them due to current Covid guidelines and our immunocompromised population.   

## 2021-09-04 ENCOUNTER — Inpatient Hospital Stay: Payer: Medicare HMO

## 2021-09-04 ENCOUNTER — Other Ambulatory Visit: Payer: Self-pay | Admitting: Hematology and Oncology

## 2021-09-04 ENCOUNTER — Other Ambulatory Visit: Payer: Self-pay

## 2021-09-04 VITALS — BP 122/88 | HR 78 | Temp 98.2°F | Resp 18

## 2021-09-04 DIAGNOSIS — Z5112 Encounter for antineoplastic immunotherapy: Secondary | ICD-10-CM | POA: Diagnosis not present

## 2021-09-04 DIAGNOSIS — C8338 Diffuse large B-cell lymphoma, lymph nodes of multiple sites: Secondary | ICD-10-CM

## 2021-09-04 MED ORDER — PROCHLORPERAZINE MALEATE 10 MG PO TABS
10.0000 mg | ORAL_TABLET | Freq: Once | ORAL | Status: AC
  Administered 2021-09-04: 10 mg via ORAL
  Filled 2021-09-04: qty 1

## 2021-09-04 MED ORDER — SODIUM CHLORIDE 0.9 % IV SOLN: Freq: Once | INTRAVENOUS | Status: AC

## 2021-09-04 MED ORDER — SODIUM CHLORIDE 0.9 % IV SOLN
50.0000 mg/m2 | Freq: Once | INTRAVENOUS | Status: AC
  Administered 2021-09-04: 120 mg via INTRAVENOUS
  Filled 2021-09-04: qty 6

## 2021-09-05 ENCOUNTER — Inpatient Hospital Stay: Payer: Medicare HMO

## 2021-09-07 ENCOUNTER — Ambulatory Visit (HOSPITAL_COMMUNITY)
Admission: RE | Admit: 2021-09-07 | Discharge: 2021-09-07 | Disposition: A | Discharge status: Home / Self Care | Payer: Medicare HMO | Source: Ambulatory Visit | Attending: Hematology and Oncology | Admitting: Hematology and Oncology

## 2021-09-07 ENCOUNTER — Other Ambulatory Visit: Payer: Self-pay

## 2021-09-07 ENCOUNTER — Inpatient Hospital Stay: Payer: Medicare HMO

## 2021-09-07 VITALS — BP 137/69 | HR 68 | Temp 98.3°F | Resp 18

## 2021-09-07 DIAGNOSIS — Z08 Encounter for follow-up examination after completed treatment for malignant neoplasm: Secondary | ICD-10-CM | POA: Diagnosis not present

## 2021-09-07 DIAGNOSIS — C8338 Diffuse large B-cell lymphoma, lymph nodes of multiple sites: Secondary | ICD-10-CM

## 2021-09-07 DIAGNOSIS — Z452 Encounter for adjustment and management of vascular access device: Secondary | ICD-10-CM

## 2021-09-07 LAB — GLUCOSE, CAPILLARY: Glucose-Capillary: 155 mg/dL — ABNORMAL HIGH (ref 70–99)

## 2021-09-07 MED ORDER — PEGFILGRASTIM-CBQV 6 MG/0.6ML ~~LOC~~ SOSY
6.0000 mg | PREFILLED_SYRINGE | Freq: Once | SUBCUTANEOUS | Status: AC
  Administered 2021-09-07: 6 mg via SUBCUTANEOUS
  Filled 2021-09-07: qty 0.6

## 2021-09-07 MED ORDER — HEPARIN SOD (PORK) LOCK FLUSH 100 UNIT/ML IV SOLN: 500.0000 [IU] | Freq: Once | INTRAVENOUS | Status: DC

## 2021-09-07 MED ORDER — FLUDEOXYGLUCOSE F - 18 (FDG) INJECTION
12.5000 | Freq: Once | INTRAVENOUS | Status: AC
  Administered 2021-09-07: 12.2 via INTRAVENOUS

## 2021-09-07 MED ORDER — HEPARIN SOD (PORK) LOCK FLUSH 100 UNIT/ML IV SOLN
250.0000 [IU] | Freq: Once | INTRAVENOUS | Status: AC
  Administered 2021-09-07: 250 [IU]

## 2021-09-07 MED ORDER — SODIUM CHLORIDE 0.9% FLUSH
10.0000 mL | Freq: Once | INTRAVENOUS | Status: AC
  Administered 2021-09-07: 10 mL

## 2021-09-07 NOTE — Progress Notes (Signed)
Pt requested for picc line to be flushed today, he stated he was scared it would get clogged since it has not been flushed in a few days. Picc flushed and working properly.

## 2021-09-08 ENCOUNTER — Encounter: Payer: Self-pay | Admitting: Hematology and Oncology

## 2021-09-09 ENCOUNTER — Inpatient Hospital Stay: Payer: Medicare HMO

## 2021-09-09 ENCOUNTER — Other Ambulatory Visit: Payer: Self-pay

## 2021-09-09 DIAGNOSIS — Z5112 Encounter for antineoplastic immunotherapy: Secondary | ICD-10-CM | POA: Diagnosis not present

## 2021-09-09 DIAGNOSIS — Z452 Encounter for adjustment and management of vascular access device: Secondary | ICD-10-CM

## 2021-09-09 MED ORDER — SODIUM CHLORIDE 0.9% FLUSH: 10.0000 mL | Freq: Once | INTRAVENOUS | Status: DC

## 2021-09-09 MED ORDER — HEPARIN SOD (PORK) LOCK FLUSH 100 UNIT/ML IV SOLN: 500.0000 [IU] | Freq: Once | INTRAVENOUS | Status: DC

## 2021-09-10 ENCOUNTER — Telehealth: Payer: Self-pay | Admitting: Hematology and Oncology

## 2021-09-10 NOTE — Telephone Encounter (Signed)
Scheduled per sch msg. Called with interpreter, was not able to leave msg. Mailed printout

## 2021-09-11 ENCOUNTER — Telehealth: Payer: Self-pay | Admitting: *Deleted

## 2021-09-11 NOTE — Telephone Encounter (Signed)
TCT to Sal[place], family member of Mr. Katen. Spoke with her and advised that his recent PET scan shoed that he is having a good response to the chemotherapy in all lymph nodes involved. Advised that Mr. Trompeter will have just one more treatment and a a follow up scan at a later date.  She voiced understanding and will let Mr. Hippler know of this.

## 2021-09-11 NOTE — Telephone Encounter (Signed)
-----   Message from Orson Slick, MD sent at 09/11/2021  9:14 AM EDT ----- Please let Mr. Shifflett know (via interpreter) that his PET CT scan shows an excellent response to therapy in all affected lymph nodes. We will proceed for 1 more cycle of chemotherapy and do another scan after completion of treatment.   ----- Message ----- From: Interface, Rad Results In Sent: 09/08/2021   3:00 PM EDT To: Orson Slick, MD

## 2021-09-23 ENCOUNTER — Inpatient Hospital Stay: Payer: Medicare HMO

## 2021-09-23 ENCOUNTER — Other Ambulatory Visit: Payer: Self-pay | Admitting: *Deleted

## 2021-09-23 ENCOUNTER — Inpatient Hospital Stay: Payer: Medicare HMO | Attending: Hematology and Oncology | Admitting: Hematology and Oncology

## 2021-09-23 ENCOUNTER — Other Ambulatory Visit: Payer: Self-pay

## 2021-09-23 VITALS — BP 112/61 | HR 70 | Temp 97.8°F | Resp 17

## 2021-09-23 VITALS — BP 132/70 | HR 75 | Temp 97.8°F | Resp 16

## 2021-09-23 DIAGNOSIS — Z5112 Encounter for antineoplastic immunotherapy: Secondary | ICD-10-CM | POA: Insufficient documentation

## 2021-09-23 DIAGNOSIS — D6481 Anemia due to antineoplastic chemotherapy: Secondary | ICD-10-CM | POA: Insufficient documentation

## 2021-09-23 DIAGNOSIS — C8333 Diffuse large B-cell lymphoma, intra-abdominal lymph nodes: Secondary | ICD-10-CM | POA: Diagnosis present

## 2021-09-23 DIAGNOSIS — R600 Localized edema: Secondary | ICD-10-CM | POA: Insufficient documentation

## 2021-09-23 DIAGNOSIS — E1122 Type 2 diabetes mellitus with diabetic chronic kidney disease: Secondary | ICD-10-CM | POA: Diagnosis not present

## 2021-09-23 DIAGNOSIS — Z452 Encounter for adjustment and management of vascular access device: Secondary | ICD-10-CM

## 2021-09-23 DIAGNOSIS — Z5189 Encounter for other specified aftercare: Secondary | ICD-10-CM | POA: Insufficient documentation

## 2021-09-23 DIAGNOSIS — C8338 Diffuse large B-cell lymphoma, lymph nodes of multiple sites: Secondary | ICD-10-CM

## 2021-09-23 DIAGNOSIS — D649 Anemia, unspecified: Secondary | ICD-10-CM

## 2021-09-23 DIAGNOSIS — Z7901 Long term (current) use of anticoagulants: Secondary | ICD-10-CM | POA: Insufficient documentation

## 2021-09-23 DIAGNOSIS — Z5111 Encounter for antineoplastic chemotherapy: Secondary | ICD-10-CM | POA: Diagnosis present

## 2021-09-23 DIAGNOSIS — I4891 Unspecified atrial fibrillation: Secondary | ICD-10-CM | POA: Insufficient documentation

## 2021-09-23 DIAGNOSIS — K5900 Constipation, unspecified: Secondary | ICD-10-CM | POA: Diagnosis not present

## 2021-09-23 DIAGNOSIS — T451X5D Adverse effect of antineoplastic and immunosuppressive drugs, subsequent encounter: Secondary | ICD-10-CM | POA: Diagnosis not present

## 2021-09-23 DIAGNOSIS — R319 Hematuria, unspecified: Secondary | ICD-10-CM | POA: Insufficient documentation

## 2021-09-23 DIAGNOSIS — I129 Hypertensive chronic kidney disease with stage 1 through stage 4 chronic kidney disease, or unspecified chronic kidney disease: Secondary | ICD-10-CM | POA: Diagnosis not present

## 2021-09-23 DIAGNOSIS — G4733 Obstructive sleep apnea (adult) (pediatric): Secondary | ICD-10-CM | POA: Diagnosis not present

## 2021-09-23 LAB — CBC WITH DIFFERENTIAL (CANCER CENTER ONLY)
Abs Immature Granulocytes: 0.11 10*3/uL — ABNORMAL HIGH (ref 0.00–0.07)
Basophils Absolute: 0.1 10*3/uL (ref 0.0–0.1)
Basophils Relative: 1 %
Eosinophils Absolute: 0.1 10*3/uL (ref 0.0–0.5)
Eosinophils Relative: 2 %
HCT: 25.8 % — ABNORMAL LOW (ref 39.0–52.0)
Hemoglobin: 8.3 g/dL — ABNORMAL LOW (ref 13.0–17.0)
Immature Granulocytes: 2 %
Lymphocytes Relative: 17 %
Lymphs Abs: 1.1 10*3/uL (ref 0.7–4.0)
MCH: 25.9 pg — ABNORMAL LOW (ref 26.0–34.0)
MCHC: 32.2 g/dL (ref 30.0–36.0)
MCV: 80.6 fL (ref 80.0–100.0)
Monocytes Absolute: 0.7 10*3/uL (ref 0.1–1.0)
Monocytes Relative: 11 %
Neutro Abs: 4.6 10*3/uL (ref 1.7–7.7)
Neutrophils Relative %: 67 %
Platelet Count: 187 10*3/uL (ref 150–400)
RBC: 3.2 MIL/uL — ABNORMAL LOW (ref 4.22–5.81)
RDW: 17.9 % — ABNORMAL HIGH (ref 11.5–15.5)
WBC Count: 6.7 10*3/uL (ref 4.0–10.5)
nRBC: 0 % (ref 0.0–0.2)

## 2021-09-23 LAB — CMP (CANCER CENTER ONLY)
ALT: 43 U/L (ref 0–44)
AST: 32 U/L (ref 15–41)
Albumin: 3.1 g/dL — ABNORMAL LOW (ref 3.5–5.0)
Alkaline Phosphatase: 108 U/L (ref 38–126)
Anion gap: 9 (ref 5–15)
BUN: 17 mg/dL (ref 8–23)
CO2: 23 mmol/L (ref 22–32)
Calcium: 8.4 mg/dL — ABNORMAL LOW (ref 8.9–10.3)
Chloride: 106 mmol/L (ref 98–111)
Creatinine: 1.31 mg/dL — ABNORMAL HIGH (ref 0.61–1.24)
GFR, Estimated: 57 mL/min — ABNORMAL LOW (ref >60)
Glucose, Bld: 335 mg/dL — ABNORMAL HIGH (ref 70–99)
Potassium: 4.2 mmol/L (ref 3.5–5.1)
Sodium: 138 mmol/L (ref 135–145)
Total Bilirubin: 0.5 mg/dL (ref 0.3–1.2)
Total Protein: 5.1 g/dL — ABNORMAL LOW (ref 6.5–8.1)

## 2021-09-23 LAB — URIC ACID: Uric Acid, Serum: 3.5 mg/dL — ABNORMAL LOW (ref 3.7–8.6)

## 2021-09-23 LAB — LACTATE DEHYDROGENASE: LDH: 233 U/L — ABNORMAL HIGH (ref 98–192)

## 2021-09-23 MED ORDER — PALONOSETRON HCL INJECTION 0.25 MG/5ML
0.2500 mg | Freq: Once | INTRAVENOUS | Status: AC
  Administered 2021-09-23: 0.25 mg via INTRAVENOUS
  Filled 2021-09-23: qty 5

## 2021-09-23 MED ORDER — SODIUM CHLORIDE 0.9% FLUSH
10.0000 mL | INTRAVENOUS | Status: DC | PRN
  Administered 2021-09-23: 10 mL

## 2021-09-23 MED ORDER — SODIUM CHLORIDE 0.9 % IV SOLN
375.0000 mg/m2 | Freq: Once | INTRAVENOUS | Status: AC
  Administered 2021-09-23: 900 mg via INTRAVENOUS
  Filled 2021-09-23: qty 50

## 2021-09-23 MED ORDER — HEPARIN SOD (PORK) LOCK FLUSH 100 UNIT/ML IV SOLN
500.0000 [IU] | Freq: Once | INTRAVENOUS | Status: AC
  Administered 2021-09-23: 500 [IU]

## 2021-09-23 MED ORDER — PREDNISONE 20 MG PO TABS
60.0000 mg | ORAL_TABLET | Freq: Once | ORAL | Status: AC
  Administered 2021-09-23: 60 mg via ORAL

## 2021-09-23 MED ORDER — SODIUM CHLORIDE 0.9 % IV SOLN
50.0000 mg/m2 | Freq: Once | INTRAVENOUS | Status: AC
  Administered 2021-09-23: 120 mg via INTRAVENOUS
  Filled 2021-09-23: qty 6

## 2021-09-23 MED ORDER — ACETAMINOPHEN 325 MG PO TABS
650.0000 mg | ORAL_TABLET | Freq: Once | ORAL | Status: AC
  Administered 2021-09-23: 650 mg via ORAL
  Filled 2021-09-23: qty 2

## 2021-09-23 MED ORDER — SODIUM CHLORIDE 0.9 % IV SOLN
10.0000 mg | Freq: Once | INTRAVENOUS | Status: AC
  Administered 2021-09-23: 10 mg via INTRAVENOUS
  Filled 2021-09-23: qty 10

## 2021-09-23 MED ORDER — VINCRISTINE SULFATE CHEMO INJECTION 1 MG/ML
2.0000 mg | Freq: Once | INTRAVENOUS | Status: AC
  Administered 2021-09-23: 2 mg via INTRAVENOUS
  Filled 2021-09-23: qty 2

## 2021-09-23 MED ORDER — HEPARIN SOD (PORK) LOCK FLUSH 100 UNIT/ML IV SOLN
250.0000 [IU] | Freq: Once | INTRAVENOUS | Status: AC | PRN
  Administered 2021-09-23: 250 [IU]

## 2021-09-23 MED ORDER — SODIUM CHLORIDE 0.9 % IV SOLN
750.0000 mg/m2 | Freq: Once | INTRAVENOUS | Status: AC
  Administered 2021-09-23: 1740 mg via INTRAVENOUS
  Filled 2021-09-23: qty 87

## 2021-09-23 MED ORDER — SODIUM CHLORIDE 0.9 % IV SOLN: Freq: Once | INTRAVENOUS | Status: AC

## 2021-09-23 MED ORDER — DIPHENHYDRAMINE HCL 25 MG PO CAPS
50.0000 mg | ORAL_CAPSULE | Freq: Once | ORAL | Status: AC
  Administered 2021-09-23: 50 mg via ORAL
  Filled 2021-09-23: qty 2

## 2021-09-23 MED ORDER — SODIUM CHLORIDE 0.9% FLUSH
10.0000 mL | Freq: Once | INTRAVENOUS | Status: AC
  Administered 2021-09-23: 10 mL

## 2021-09-23 NOTE — Progress Notes (Signed)
Pt made aware by Dr Lorenso Courier to only take prednisone for three days d/t open wounds

## 2021-09-23 NOTE — Patient Instructions (Addendum)
[facility]COL[facility]  Discharge Instructions: Thank you for choosing [facility] to provide your oncology and hematology care.   If you have a lab appointment with the [facility], please go directly to the [facility] and check in at the registration area.   Wear comfortable clothing and clothing appropriate for easy access to any Portacath or PICC line.   We strive to give you quality time with your provider. You may need to reschedule your appointment if you arrive late (15 or more minutes).  Arriving late affects you and other patients whose appointments are after yours.  Also, if you miss three or more appointments without notifying the office, you may be dismissed from the clinic at the provider's discretion.      For prescription refill requests, have your pharmacy contact our office and allow 72 hours for refills to be completed.    Today you received the following chemotherapy and/or immunotherapy agents: vincristine, cytoxan, etoposide, and rituximab      To help prevent nausea and vomiting after your treatment, we encourage you to take your nausea medication as directed.  BELOW ARE SYMPTOMS THAT SHOULD BE REPORTED IMMEDIATELY: *FEVER GREATER THAN 100.4 F (38 C) OR HIGHER *CHILLS OR SWEATING *NAUSEA AND VOMITING THAT IS NOT CONTROLLED WITH YOUR NAUSEA MEDICATION *UNUSUAL SHORTNESS OF BREATH *UNUSUAL BRUISING OR BLEEDING *URINARY PROBLEMS (pain or burning when urinating, or frequent urination) *BOWEL PROBLEMS (unusual diarrhea, constipation, pain near the anus) TENDERNESS IN MOUTH AND THROAT WITH OR WITHOUT PRESENCE OF ULCERS (sore throat, sores in mouth, or a toothache) UNUSUAL RASH, SWELLING OR PAIN  UNUSUAL VAGINAL DISCHARGE OR ITCHING   Items with * indicate a potential emergency and should be followed up as soon as possible or go to the Emergency Department if any problems should occur.  Please show the CHEMOTHERAPY ALERT CARD or  IMMUNOTHERAPY ALERT CARD at check-in to the Emergency Department and triage nurse.  Should you have questions after your visit or need to cancel or reschedule your appointment, please contact [facility]  Dept: [phone]  and follow the prompts.  Office hours are 8:00 a.m. to 4:30 p.m. Monday - Friday. Please note that voicemails left after 4:00 p.m. may not be returned until the following business day.  We are closed weekends and major holidays. You have access to a nurse at all times for urgent questions. Please call the main number to the clinic Dept: [phone] and follow the prompts.   For any non-urgent questions, you may also contact your provider using MyChart. We now offer e-Visits for anyone 67 and older to request care online for non-urgent symptoms. For details visit [email or web address].   Also download the MyChart app! Go to the app store, search "MyChart", open the app, select [facility], and log in with your MyChart username and password.  Due to Covid, a mask is required upon entering the hospital/clinic. If you do not have a mask, one will be given to you upon arrival. For doctor visits, patients may have 1 support person aged 8 or older with them. For treatment visits, patients cannot have anyone with them due to current Covid guidelines and our immunocompromised population.

## 2021-09-23 NOTE — Progress Notes (Signed)
[place] [phone]   [phone]  PROGRESS NOTE  Patient Care Team: [name], MD as PCP - General (Family Medicine)  Hematological/Oncological History # Diffuse Large B Cell Lymphoma, Stage III/IV 04/30/2021: presented with RLQ abdominal pain. CT abdomen/pelvis shows pathologic mesenteric, retroperitoneal, and pelvic adenopathy, as described above. Moderate splenomegaly. Lobulated retroperitoneal soft tissue masses in the region of the right ureteropelvic junction likely resulting in mild right hydronephrosis secondary to extrinsic mass effect as well as within the right retroperitoneum 05/04/2021: needle core biopsy of right retroperitoneal lymph node shows large B cell lymphoma. 05/13/2021: establish care with Dr. Lorenso Courier 05/23/2021-06/01/2021: admitted with urosepsis. Concern for endocarditis, started on 6 week course abx via PICC line.   06/09/2021-06/11/2021: Cycle 1 of R-CEOP 07/01/2021-07/03/2021: Cycle 2 of R-CEOP 07/22/2021-07/24/2021: Cycle 3 of R-CEOP 08/12/2021-08/14/2021: Cycle 4 of R-CEOP 09/02/2021-09/04/2021: Cycle 5 of R-CEOP 09/07/2021: PET CT scan showed marked interval improvement with respect to nodal disease in the neck, chest, abdomen and pelvis, near complete resolution of visible, lymph nodes. Deauville criteria 2/3. 09/23/2021-09/25/2021: Cycle 6 of R-CEOP  Interval History:  [name] 75 y.o. male with medical history significant for stage III/IV diffuse large B-cell lymphoma presents for a follow up visit. The patient's last visit was on 09/02/2021. In the interim since the last visit he completed Cycle 5 of R-CEOP.   On exam today, [name] reports he has been having some unsteadiness on his feet today.  He reports that with chemotherapy he is getting "weaker and weaker".  He notes that he is not having any issues with nausea, vomiting, or diarrhea fortunately.  He has had some constipation for which she is taking  over-the-counter stool softener which is helped to relieve this. His appetite has been good and his weight has been stable.  He denies any fevers, chills, night sweats, shortness of breath, chest pain or cough. He has no other complaints.   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 per dr Lucianne Lei dam note 06-18-2021   Hematuria 06/18/2021   Hypertension    Sleep apnea    uses cpap   Uses walker 07/24/2021   prn   UTI (urinary tract infection)    finished antibiotics on 07-08-2021    SURGICAL HISTORY: Past Surgical History:  Procedure Laterality Date   CARDIAC CATHETERIZATION     CYSTOSCOPY W/ URETERAL STENT PLACEMENT Right 07/28/2021   Procedure: CYSTOSCOPY WITH RETROGRADE PYELOGRAM/URETERAL STENT EXCHANGE;  Surgeon: [name], MD;  Location: [facility];  Service: Urology;  Laterality: Right;  [place], URETEROSCOPY AND STENT PLACEMENT Right 05/01/2021   Procedure: CYSTOSCOPY WITH RIGHT  RETROGRADE PYELOGRAM,  AND RIGHT STENT PLACEMENT;  Surgeon: Festus Aloe, MD;  Location: [facility];  Service: Urology;  Laterality: Right;   EYE SURGERY     INJECTION OF SILICONE OIL Left 92/42/6834   Procedure: INJECTION OF SILICONE OIL;  Surgeon: Sherlynn Stalls, MD;  Location: [place];  Service: Ophthalmology;  Laterality: Left;   INJECTION OF SILICONE OIL Left 1/96/2229   Procedure: INJECTION OF SILICONE OIL;  Surgeon: Baird Cancer,  Corene Cornea, MD;  Location: [place];  Service: Ophthalmology;  Laterality: Left;   JOINT REPLACEMENT Right    hip   LASER PHOTO ABLATION Left 12/25/2020   Procedure: LASER PHOTO ABLATION;  Surgeon: Sherlynn Stalls, MD;  Location: [place];  Service: Ophthalmology;  Laterality: Left;   PARS PLANA VITRECTOMY Left 11/10/2020   Procedure: PARS PLANA VITRECTOMY WITH 25 GAUGE;  Surgeon: Sherlynn Stalls, MD;  Location: [place];  Service: Ophthalmology;  Laterality: Left;   PARS PLANA VITRECTOMY Left 12/25/2020   Procedure: PARS PLANA VITRECTOMY WITH 25 GAUGE IN LEFT EYE;  Surgeon: Sherlynn Stalls, MD;  Location: [place];  Service: Ophthalmology;  Laterality: Left;   PERFLUORONE INJECTION Left 12/25/2020   Procedure: PERFLUORONE INJECTION;  Surgeon: Sherlynn Stalls, MD;  Location: [place];  Service: Ophthalmology;  Laterality: Left;   PHOTOCOAGULATION WITH LASER Left 11/10/2020   Procedure: PHOTOCOAGULATION WITH LASER;  Surgeon: Sherlynn Stalls, MD;  Location: [place];  Service: Ophthalmology;  Laterality: Left;   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   Procedure: RETINECTOMY LEFT EYE;  Surgeon: Sherlynn Stalls, MD;  Location: [place];  Service: Ophthalmology;  Laterality: Left;   SILICON OIL REMOVAL Left 8/85/0277   Procedure: SILICONE OIL REMOVAL;  Surgeon: Sherlynn Stalls, MD;  Location: [place];  Service: Ophthalmology;  Laterality: Left;   TEE WITHOUT CARDIOVERSION N/A 05/29/2021   Procedure: TRANSESOPHAGEAL ECHOCARDIOGRAM (TEE);  Surgeon: Werner Lean, MD;  Location: [facility];  Service: Cardiovascular;  Laterality: N/A;    SOCIAL HISTORY: Social History   Socioeconomic History   Marital status: Married    Spouse name: Not on file   Number of children: Not on file   Years of education: Not on file   Highest education level: Not on file  Occupational History   Not on file  Tobacco Use   Smoking status: Never   Smokeless tobacco: Never  Vaping Use   Vaping Use: Never used  Substance and Sexual Activity   Alcohol use: Not Currently   Drug use: Never   Sexual activity: Yes  Other Topics Concern   Not on file  Social History Narrative   Not on file   Social Determinants of Health   Financial  Resource Strain: Not on file  Food Insecurity: Not on file  Transportation Needs: Not on file  Physical Activity: Not on file  Stress: Not on file  Social Connections: Not on file  Intimate Partner Violence: Not on file    FAMILY HISTORY: Family History  Problem Relation Age of Onset   CAD Other     ALLERGIES:  has no allergies on file.  MEDICATIONS:  Current Outpatient Medications  Medication Sig Dispense Refill   acetaminophen (TYLENOL) 650 MG CR tablet Take 650 mg by mouth every 8 (eight) hours as needed for pain or fever (headache). (Patient not taking: Reported on 09/02/2021)     allopurinol (ZYLOPRIM) 300 MG tablet Take 1 tablet (300 mg total) by mouth daily. 90 tablet 1   amiodarone (PACERONE) 200 MG tablet Take 200 mg by mouth every morning.     amLODipine (NORVASC) 10 MG tablet Take 1 tablet by mouth once daily 30 tablet 0   apixaban (ELIQUIS) 5 MG TABS tablet Take 5 mg by mouth 2 (two) times daily.     atorvastatin (LIPITOR) 20 MG tablet Take 20 mg by mouth at bedtime.     clopidogrel (PLAVIX) 75 MG tablet Take 75 mg by mouth daily. (Patient not taking: Reported  on 09/02/2021)     finasteride (PROSCAR) 5 MG tablet Take 5 mg by mouth at bedtime.     furosemide (LASIX) 20 MG tablet Take 1 tablet (20 mg total) by mouth daily. 30 tablet 1   glipiZIDE (GLUCOTROL) 10 MG tablet Take 10 mg by mouth 2 (two) times daily.     Heparin Sodium Flush 100-0.9 UNIT/ML-% KIT Inject 500 Units into the vein 2 (two) times a week. Flush PICC with 250 units (1/2 syringe) 2 x weekly   discard remaining 12 kit 1   insulin glargine (LANTUS) 100 unit/mL SOPN Inject 34 Units into the skin at bedtime.     isosorbide mononitrate (IMDUR) 30 MG 24 hr tablet Take 30 mg by mouth every morning.     metFORMIN (GLUCOPHAGE) 1000 MG tablet Take 1,000 mg by mouth 2 (two) times daily.     metoprolol tartrate (LOPRESSOR) 50 MG tablet Take 1 tablet (50 mg total) by mouth 2 (two) times daily. 60 tablet 1    nitroGLYCERIN (NITROSTAT) 0.3 MG SL tablet Place 0.3 mg under the tongue every 5 (five) minutes x 3 doses as needed for chest pain. (Patient not taking: Reported on 09/02/2021)     oxyCODONE (OXY IR/ROXICODONE) 5 MG immediate release tablet Take 1 tablet (5 mg total) by mouth every 4 (four) hours as needed for severe pain. (Patient not taking: No sig reported) 20 tablet 0   predniSONE (DELTASONE) 20 MG tablet Take 3 tablets (60 mg total) by mouth daily with breakfast. Take 3 pills per day starting on Day 1 of chemotherapy, for 5 total days. 15 tablet 5   prochlorperazine (COMPAZINE) 10 MG tablet Take 1 tablet (10 mg total) by mouth every 6 (six) hours as needed for nausea or vomiting. (Patient not taking: Reported on 09/02/2021) 30 tablet 0   Sodium Chloride Flush (NORMAL SALINE FLUSH) 0.9 % SOLN Flush PICC line with 52m 1-2 times a week as directed (Patient not taking: Reported on 09/02/2021) 120 mL 1   tenofovir (VIREAD) 300 MG tablet Take 1 tablet (300 mg total) by mouth every other day. (Patient not taking: Reported on 09/02/2021) 15 tablet 11   No current facility-administered medications for this visit.   Facility-Administered Medications Ordered in Other Visits  Medication Dose Route Frequency Provider Last Rate Last Admin   prochlorperazine (COMPAZINE) 10 MG tablet            sodium chloride flush (NS) 0.9 % injection 10 mL  10 mL Intracatheter PRN DLedell PeoplesIV, MD       sodium chloride flush (NS) 0.9 % injection 10 mL  10 mL Intravenous Once DLedell PeoplesIV, MD       sodium chloride flush (NS) 0.9 % injection 10 mL  10 mL Intracatheter PRN DOrson Slick MD   10 mL at 09/23/21 1502    REVIEW OF SYSTEMS:   Constitutional: ( - ) fevers, ( - )  chills , ( - ) night sweats Eyes: ( - ) blurriness of vision, ( - ) double vision, ( - ) watery eyes Ears, nose, mouth, throat, and face: ( - ) mucositis, ( - ) sore throat Respiratory: ( - ) cough, ( - ) dyspnea, ( - )  wheezes Cardiovascular: ( - ) palpitation, ( - ) chest discomfort, ( +) lower extremity swelling Gastrointestinal:  ( - ) nausea, ( - ) heartburn, ( - ) change in bowel habits Skin: ( - ) abnormal skin rashes  Lymphatics: ( - ) new lymphadenopathy, ( - ) easy bruising Neurological: ( - ) numbness, ( - ) tingling, ( - ) new weaknesses Behavioral/Psych: ( - ) mood change, ( - ) new changes  All other systems were reviewed with the patient and are negative.  PHYSICAL EXAMINATION: ECOG PERFORMANCE STATUS: 1 - Symptomatic but completely ambulatory  Vitals:   09/23/21 0839  BP: 132/70  Pulse: 75  Resp: 16  Temp: 97.8 F (36.6 C)  SpO2: 100%     Filed Weights    GENERAL: Well-appearing elderly [place] male, alert, no distress and comfortable SKIN: skin color, texture, turgor are normal, no rashes or significant lesions EYES: conjunctiva are pink and non-injected, sclera clear NECK: supple, non-tender LYMPH:  no palpable lymphadenopathy in the cervical, axillary or inguinal LUNGS: clear to auscultation and percussion with normal breathing effort HEART: regular rate & rhythm and no murmurs. 1+ pitting edema bilaterally in the lower extremities below the knee. PSYCH: alert & oriented x 3, fluent speech NEURO: no focal motor/sensory deficits  LABORATORY DATA:  I have reviewed the data as listed CBC Latest Ref Rng & Units 09/23/2021 09/02/2021 08/19/2021  WBC 4.0 - 10.5 K/uL 6.7 7.4 4.8  Hemoglobin 13.0 - 17.0 g/dL 8.3(L) 8.5(L) 7.8(L)  Hematocrit 39.0 - 52.0 % 25.8(L) 26.0(L) 23.9(L)  Platelets 150 - 400 K/uL 187 222 163    CMP Latest Ref Rng & Units 09/23/2021 09/02/2021 08/19/2021  Glucose 70 - 99 mg/dL 335(H) 227(H) 356(H)  BUN 8 - 23 mg/dL 17 22 29(H)  Creatinine 0.61 - 1.24 mg/dL 1.31(H) 1.43(H) 1.52(H)  Sodium 135 - 145 mmol/L 138 142 139  Potassium 3.5 - 5.1 mmol/L 4.2 4.4 4.5  Chloride 98 - 111 mmol/L 106 108 106  CO2 22 - 32 mmol/L _0 Calcium 8.9 - 10.3 mg/dL  8.4(L) 9.0 8.7(L)  Total Protein 6.5 - 8.1 g/dL 5.1(L) 5.3(L) 5.0(L)  Total Bilirubin 0.3 - 1.2 mg/dL 0.5 0.4 0.5  Alkaline Phos 38 - 126 U/L 108 88 91  AST 15 - 41 U/L 32 28 20  ALT 0 - 44 U/L 43 35 34     RADIOGRAPHIC STUDIES: I have personally reviewed the radiological images as listed and agreed with the findings in the report: PET CT scan findings with concern for liver involvement, and lymphadenopathy that is FDG avid on both sides of the diaphragm.  Findings are most consistent with a stage III/IV diffuse large B-cell lymphoma.  NM PET Image Restag (PS) Skull Base To Thigh  Result Date: 09/08/2021 CLINICAL DATA:  Subsequent treatment strategy for hematologic malignancy, assess treatment response in a 75 year old male. EXAM: NUCLEAR MEDICINE PET SKULL BASE TO THIGH TECHNIQUE: 12.2 mCi F-18 FDG was injected intravenously. Full-ring PET imaging was performed from the skull base to thigh after the radiotracer. CT data was obtained and used for attenuation correction and anatomic localization. Fasting blood glucose: 155 mg/dl COMPARISON:  June 03, 2021. FINDINGS: Mediastinal blood pool activity: SUV max 1.93 Liver activity: SUV max 3.04 NECK: Scattered small lymph nodes throughout the neck are diminished in size. For instance, the index node seen at RIGHT level II/III on the prior study was previously 9 mm. Largest nodes in the neck are now less than 5 mm size. (Image 38/4) maximum uptake in lymph nodes in the neck currently in the range of 1 or less. Hypermetabolic focus in the LEFT thyroid gland corresponds to small nodule with a maximum SUV of 5.7 (image 49/4) this  measures 7 mm. Incidental CT findings: none CHEST: Bilateral axillary lymph nodes have nearly completely resolved since previous imaging. Likewise mediastinal lymph nodes have markedly improved and or resolved since prior imaging. Index LEFT axillary lymph node as perhaps the largest remaining lymph node in the LEFT axilla (image 61/4)  maximum SUV of 1.3 as compared to 3.3 with a short axis dimension of 8 mm. Largest node in this same area previously approximately 14 mm short axis. RIGHT paratracheal lymph node (image 64/4) 9 mm short axis with a maximum SUV of 1.3 as compared to 3.6. This represents the largest node remaining in the mediastinum. No hilar adenopathy. No suspicious pulmonary nodule with increased metabolic activity Incidental CT findings: 4.6 cm ascending thoracic aortic caliber is similar to prior imaging. Heart size stable enlarged with calcified coronary artery disease no substantial pericardial effusion. Central pulmonary vasculature is enlarged at 3.7 cm. RIGHT-sided PICC line has been withdrawn into the high RIGHT axillary vein just in the area of the subclavian transition (image 57/4 ABDOMEN/PELVIS: Splenic activity is greater than liver but without focal features. Splenic size may however be slightly diminished compared to the previous study at 17 as compared to 18 cm, still within the realm of measurement air. Splenic uptake at 3.6 as compared to 3.5 for maximum SUV. No adenopathy with increased metabolic activity in the abdomen or pelvis. Area of greatest uptake on the previous study along LEFT common iliac vessels with largest lymph node in this area at 8 mm (image 168/4) previously this lymph node measures 13 mm and a larger lymph node measuring 15 mm short axis were seen in this location. Previous maximum SUV in this area 6.2, current maximum SUV of 2.3. Similar decrease in size of pelvic lymph nodes. Largest remaining pelvic lymph node (image 187/4) 13 mm short axis displaying a maximum SUV of 2.1. Slightly smaller nodes adjacent to the distal RIGHT ureter with similar minimal FDG uptake. No new nodal disease. There is diminished soft tissue about the proximal RIGHT ureter. This measures 2.9 x 2.0 cm as compared to 5.4 x 4.2 cm. Uptake related to this soft tissue is difficult to assess but grossly similar to other  areas that have been described. Estimated at 2.1 for maximum SUV previously as much as 13.8 on the prior study. Difficulty arises given passage of the ureter through this area. Stent remaining in place without hydronephrosis. Incidental CT findings: Liver, gallbladder, pancreas, spleen, adrenal glands and kidneys without acute findings. No RIGHT-sided hydronephrosis. Perinephric stranding with similar appearance to prior imaging. Aortic atherosclerosis without aneurysmal change in the abdominal aorta. No acute gastrointestinal findings. SKELETON: Heterogeneous marrow uptake without discrete lesion to indicate bony involvement Healing of RIGHT-sided rib fractures that were present on previous imaging of ribs 5, 6, 7, 8 and 9. Incidental CT findings: none IMPRESSION: Marked interval improvement with respect to nodal disease in the neck, chest, abdomen and pelvis, near complete resolution of visible lymph nodes. Uptake only slightly above and less than mediastinal blood pool, all areas less than liver currently, Deauville criteria 2/3. Spleen remains enlarged with mild increased metabolic activity relative to liver but without focal abnormality. Heterogeneous marrow uptake is nonspecific. Continued attention on follow-up. LEFT thyroid lesion with focal increased metabolic activity. Recommend thyroid US and biopsy (ref: J Am Coll Radiol. 2015 Feb;12(2): 143-50). 4.6 cm dilation of the ascending thoracic aorta. Ascending thoracic aortic aneurysm. Recommend semi-annual imaging followup by CTA or MRA and referral to cardiothoracic surgery if not already obtained.  This recommendation follows 2010 ACCF/AHA/AATS/ACR/ASA/SCA/SCAI/SIR/STS/SVM Guidelines for the Diagnosis and Management of Patients With Thoracic Aortic Disease. Circulation. 2010; 121: V779-T903. Aortic aneurysm NOS (ICD10-I71.9) Electronically Signed   By: Zetta Bills M.D.   On: 09/08/2021 14:57    ASSESSMENT & PLAN [name] 75 y.o. male with  medical history significant for stage III/IV diffuse large B-cell lymphoma presents for a follow up visit.    After review of the labs, review of the records, and discussion with the patient the patients findings are most consistent with advanced stage diffuse large B-cell lymphoma.  PET scan confirmed widespread disease in the neck, chest, abdomen and pelvis. Additionally, there is evidence of splenomegaly and a 5.4 cm soft tissue lesion along the right lower kidney. Patient does have a cardiac history significant for atrial fibrillation on amiodarone and therefore we may need to consider dropping anthracyclines from his regimen. The treatment consists of R-CEOP based regimen moving forward.    DLBCL IPI Score: 2 points, good prognosis. 80% progression free survival    # Diffuse Large B Cell Lymphoma, ABC subtype. Stage III/IV -- Findings at this time are most consistent with stage III/IV diffuse large B-cell lymphoma. --Treatment consists R-CEOP chemotherapy given his cardiac issues and mildly reduced EF(he is on amiodarone for atrial fibrillation) --Started Cycle 1 on 06/09/2021 --Due to neutropenia after Cycle 1, added G-CSF on Day 4.  Plan: --today is Cycle 6 Day 1 --patient Hgb is 8.3, will arrange for weekly labs and transfusion support as needed --post treatment PET CT scan to be performed in about 4 weeks.  --Return in 4-5 weeks with post treatment PET CT scan.   #Chemotherapy Induced Anemia --Hgb 8.3 today --will proceed with treatment today, no plan for blood transfusion --continue weekly labs --continue to monitor  #Skin Lesions -- Patient has skin lesions on his shins bilaterally as well as the right side of his abdomen.  These are consistent with thinning of the skin due to steroid therapy --We will cover with dressings today --Continue to monitor    #Supportive Care -- chemotherapy education complete -- port placement to be scheduled after his PICC is removed. We will use  the PICC for now. OK to remove PICC line once his chemotherapy is completed on Friday.  -- zofran 101m q8H PRN and compazine 169mPO q6H for nausea -- allopurinol 30082mO daily for TLS prophylaxis -- EMLA cream for port -- no pain medication required at this time.   #Hematuria, improved --Likely secondary to anticoagulation (on Eliquis and Plavix), soft tissue lesion along the medial right lower kidney s/p ureteral stent placement. --Hgb is stable at 8.5.  --patient holding Plavix, continuing eliquis.   #Lower extremity edema: --Patient was on HCTZ but was discontiued when hospitalized from AKI in the setting of R hydronephrosis due to retroperitoneal adenopathy causing mass-efect.  --Creatinine is back to baseline.  --continue lasix therapy as prescribed.   Orders Placed This Encounter  Procedures   NM PET Image Restag (PS) Skull Base To Thigh    Standing Status:   Future    Standing Expiration Date:   09/23/2022    Order Specific Question:   If indicated for the ordered procedure, I authorize the administration of a radiopharmaceutical per Radiology protocol    Answer:   Yes    Order Specific Question:   Preferred imaging location?    Answer:   [facility]  All questions were answered. The patient knows to call  the clinic with any problems, questions or concerns.  A total of more than 30 minutes were spent on this encounter with face-to-face time and non-face-to-face time, including preparing to see the patient, ordering tests and/or medications, counseling the patient and coordination of care as outlined above.   Ledell Peoples, MD Department of Hematology/Oncology [place] at [facility] Phone: [phone] Pager: [phone] Email: [email or web address]   09/23/2021 4:32 PM

## 2021-09-23 NOTE — Progress Notes (Signed)
Pt's PICC line can be removed after last treatment on Friday, 09/25/21 Per Dr. Lorenso Courier

## 2021-09-24 ENCOUNTER — Inpatient Hospital Stay: Payer: Medicare HMO

## 2021-09-24 VITALS — BP 140/67 | HR 80 | Temp 97.8°F | Resp 15

## 2021-09-24 DIAGNOSIS — Z5112 Encounter for antineoplastic immunotherapy: Secondary | ICD-10-CM | POA: Diagnosis not present

## 2021-09-24 DIAGNOSIS — C8338 Diffuse large B-cell lymphoma, lymph nodes of multiple sites: Secondary | ICD-10-CM

## 2021-09-24 MED ORDER — SODIUM CHLORIDE 0.9 % IV SOLN: Freq: Once | INTRAVENOUS | Status: AC

## 2021-09-24 MED ORDER — SODIUM CHLORIDE 0.9 % IV SOLN
50.0000 mg/m2 | Freq: Once | INTRAVENOUS | Status: AC
  Administered 2021-09-24: 120 mg via INTRAVENOUS
  Filled 2021-09-24: qty 6

## 2021-09-24 MED ORDER — PROCHLORPERAZINE MALEATE 10 MG PO TABS
10.0000 mg | ORAL_TABLET | Freq: Once | ORAL | Status: AC
  Administered 2021-09-24: 10 mg via ORAL
  Filled 2021-09-24: qty 1

## 2021-09-24 NOTE — Progress Notes (Signed)
Pt. here for treatment. PICC line assesed and catheter is extensively out. Blood return noted, but PICC not used. IV team and MD notified. Dr. Lorenso Courier here for assessment.  Periphreal IV started and IV team here for discontinuation of PICC line. Per IV team, 25 cm of PICC catheter noted.  Pt. states he took his prednisone at home this morning.

## 2021-09-24 NOTE — Progress Notes (Signed)
Requested to remove PICC.  As per flowsheet, last retracted 8 cm.  Upon assessment, more noted retracted, all neatly under PICC dressing, biopatch at insertion site.  PICC removed, only 14cm left internally. 44cm removed, consistent with placement.  SIte without signs of infection, cleaned with CHG, covered with petrolateum guaze and covered with 2x2 and occlusive dressing.  44cm .  Monica RN at chairside aware.

## 2021-09-24 NOTE — Patient Instructions (Addendum)
[facility]  Discharge Instructions: Thank you for choosing [facility] to provide your oncology and hematology care.   If you have a lab appointment with the [facility], please go directly to the [facility] and check in at the registration area.   Wear comfortable clothing and clothing appropriate for easy access to any Portacath or PICC line.   We strive to give you quality time with your provider. You may need to reschedule your appointment if you arrive late (15 or more minutes).  Arriving late affects you and other patients whose appointments are after yours.  Also, if you miss three or more appointments without notifying the office, you may be dismissed from the clinic at the provider's discretion.      For prescription refill requests, have your pharmacy contact our office and allow 72 hours for refills to be completed.    Today you received the following chemotherapy and/or immunotherapy agent: Etoposide   To help prevent nausea and vomiting after your treatment, we encourage you to take your nausea medication as directed.  BELOW ARE SYMPTOMS THAT SHOULD BE REPORTED IMMEDIATELY: *FEVER GREATER THAN 100.4 F (38 C) OR HIGHER *CHILLS OR SWEATING *NAUSEA AND VOMITING THAT IS NOT CONTROLLED WITH YOUR NAUSEA MEDICATION *UNUSUAL SHORTNESS OF BREATH *UNUSUAL BRUISING OR BLEEDING *URINARY PROBLEMS (pain or burning when urinating, or frequent urination) *BOWEL PROBLEMS (unusual diarrhea, constipation, pain near the anus) TENDERNESS IN MOUTH AND THROAT WITH OR WITHOUT PRESENCE OF ULCERS (sore throat, sores in mouth, or a toothache) UNUSUAL RASH, SWELLING OR PAIN  UNUSUAL VAGINAL DISCHARGE OR ITCHING   Items with * indicate a potential emergency and should be followed up as soon as possible or go to the Emergency Department if any problems should occur.  Please show the CHEMOTHERAPY ALERT CARD or IMMUNOTHERAPY ALERT CARD at check-in to the  Emergency Department and triage nurse.  Should you have questions after your visit or need to cancel or reschedule your appointment, please contact [facility]  Dept: [phone]  and follow the prompts.  Office hours are 8:00 a.m. to 4:30 p.m. Monday - Friday. Please note that voicemails left after 4:00 p.m. may not be returned until the following business day.  We are closed weekends and major holidays. You have access to a nurse at all times for urgent questions. Please call the main number to the clinic Dept: [phone] and follow the prompts.   For any non-urgent questions, you may also contact your provider using MyChart. We now offer e-Visits for anyone 21 and older to request care online for non-urgent symptoms. For details visit [email or web address].   Also download the MyChart app! Go to the app store, search "MyChart", open the app, select [facility], and log in with your MyChart username and password.  Due to Covid, a mask is required upon entering the hospital/clinic. If you do not have a mask, one will be given to you upon arrival. For doctor visits, patients may have 1 support person aged 88 or older with them. For treatment visits, patients cannot have anyone with them due to current Covid guidelines and our immunocompromised population.   PICC Removal, Adult, Care After The following information offers guidance on how to care for yourself after your procedure. Your health care provider may also give you more specific instructions. If you have problems or questions, contact your health care provider. What can I expect after the procedure? After the procedure, it  is common to have: Tenderness or soreness. Redness, swelling, or a scab at the place where your PICC was removed (exit site). Follow these instructions at home: For the first 24 hours after the procedure: Keep the bandage (dressing) on your exit site clean and dry. Do not remove your  dressing until your health care provider tells you to do so. Do not lift anything heavy or do activities that require great effort until your health care provider says it is okay. You should avoid: Lifting weights. Doing yard work. Doing any physical activity with repetitive arm movement. Watch closely for any signs of an air bubble in the vein (air embolism). This is a rare but serious complication. Signs of an air embolism include trouble breathing, wheezing, chest pain, or a fast pulse. If you have signs of an air embolism, call 911 right away and lie down on your left side to keep the air from moving into your lungs. After 24 hours have passed:  Remove your dressing as told by your health care provider. Wash your hands with soap and water for at least 20 seconds before and after you change your dressing. If soap and water are not available, use hand sanitizer. Return to your normal activities as told by your health care provider. A small scab may develop over the exit site. Do not pick at the scab. When bathing or showering, gently wash the exit site with soap and water. Pat it dry. Watch for signs of infection, such as: A fever or chills. Swollen glands under your arm. More redness, swelling, or soreness around your arm. Blood, fluid, or pus coming from your exit site. Warmth or a bad smell coming from your exit site. A red streak spreading away from your exit site. General instructions Take over-the-counter and prescription medicines only as told by your health care provider. Do not take any new medicines without checking with your health care provider first. If you were given an antibiotic ointment, apply it as told by your health care provider. Keep all follow-up visits. This is important. Contact a health care provider if: You have a fever or chills. You have swelling at your exit site or swollen glands under your arm. You have signs of infection at your exit site. You have  soreness, redness, or swelling in your arm that gets worse. Get help right away if: You have numbness or tingling in your fingers, hand, or arm. Your arm looks blue and feels cold. You have signs of an air embolism, such as trouble breathing, wheezing, chest pain, or a fast pulse. These symptoms may be an emergency. Get medical help right away. Call 911. Do not wait to see if the symptoms will go away. Do not drive yourself to the hospital. Summary After a PICC is removed, it is common to have tenderness or soreness, redness, swelling, or a scab at the exit site. Keep the bandage (dressing) over the exit site clean and dry. Do not remove the dressing until your health care provider tells you to do so. Do not lift anything heavy or do activities that require great effort until your health care provider says it is okay. Watch closely for any signs of an air bubble (air embolism). If you have signs of an air embolism, call 911 right away and lie down on your left side. This information is not intended to replace advice given to you by your health care provider. Make sure you discuss any questions you have with your  health care provider. Document Revised: 05/20/2021 Document Reviewed: 05/20/2021 Elsevier Patient Education  [place].

## 2021-09-25 ENCOUNTER — Other Ambulatory Visit: Payer: Self-pay

## 2021-09-25 ENCOUNTER — Inpatient Hospital Stay: Payer: Medicare HMO

## 2021-09-25 VITALS — BP 130/65 | HR 81 | Temp 98.2°F | Resp 20

## 2021-09-25 DIAGNOSIS — Z5112 Encounter for antineoplastic immunotherapy: Secondary | ICD-10-CM | POA: Diagnosis not present

## 2021-09-25 DIAGNOSIS — C8338 Diffuse large B-cell lymphoma, lymph nodes of multiple sites: Secondary | ICD-10-CM

## 2021-09-25 MED ORDER — PROCHLORPERAZINE MALEATE 10 MG PO TABS
10.0000 mg | ORAL_TABLET | Freq: Once | ORAL | Status: AC
  Administered 2021-09-25: 10 mg via ORAL
  Filled 2021-09-25: qty 1

## 2021-09-25 MED ORDER — SODIUM CHLORIDE 0.9 % IV SOLN
Freq: Once | INTRAVENOUS | Status: AC
Start: 2021-09-25 — End: 2021-09-25

## 2021-09-25 MED ORDER — SODIUM CHLORIDE 0.9 % IV SOLN
50.0000 mg/m2 | Freq: Once | INTRAVENOUS | Status: AC
  Administered 2021-09-25: 120 mg via INTRAVENOUS
  Filled 2021-09-25: qty 6

## 2021-09-25 MED ORDER — PREDNISONE 50 MG PO TABS
100.0000 mg | ORAL_TABLET | Freq: Once | ORAL | Status: AC
  Administered 2021-09-25: 100 mg via ORAL
  Filled 2021-09-25: qty 2

## 2021-09-25 NOTE — Progress Notes (Signed)
Pt did not take his prednisone today.  Prednisone 100 mg po given as per MD order.

## 2021-09-25 NOTE — Patient Instructions (Signed)
[facility]  Discharge Instructions: ?Thank you for choosing [facility] to provide your oncology and hematology care.  ? ?If you have a lab appointment with the Cancer Center, please go directly to the Cancer Center and check in at the registration area. ?  ?Wear comfortable clothing and clothing appropriate for easy access to any Portacath or PICC line.  ? ?We strive to give you quality time with your provider. You may need to reschedule your appointment if you arrive late (15 or more minutes).  Arriving late affects you and other patients whose appointments are after yours.  Also, if you miss three or more appointments without notifying the office, you may be dismissed from the clinic at the provider?s discretion.    ?  ?For prescription refill requests, have your pharmacy contact our office and allow 72 hours for refills to be completed.   ? ?Today you received the following chemotherapy and/or immunotherapy agents: Etoposide.     ?  ?To help prevent nausea and vomiting after your treatment, we encourage you to take your nausea medication as directed. ? ?BELOW ARE SYMPTOMS THAT SHOULD BE REPORTED IMMEDIATELY: ?*FEVER GREATER THAN 100.4 F (38 ?C) OR HIGHER ?*CHILLS OR SWEATING ?*NAUSEA AND VOMITING THAT IS NOT CONTROLLED WITH YOUR NAUSEA MEDICATION ?*UNUSUAL SHORTNESS OF BREATH ?*UNUSUAL BRUISING OR BLEEDING ?*URINARY PROBLEMS (pain or burning when urinating, or frequent urination) ?*BOWEL PROBLEMS (unusual diarrhea, constipation, pain near the anus) ?TENDERNESS IN MOUTH AND THROAT WITH OR WITHOUT PRESENCE OF ULCERS (sore throat, sores in mouth, or a toothache) ?UNUSUAL RASH, SWELLING OR PAIN  ?UNUSUAL VAGINAL DISCHARGE OR ITCHING  ? ?Items with * indicate a potential emergency and should be followed up as soon as possible or go to the Emergency Department if any problems should occur. ? ?Please show the CHEMOTHERAPY ALERT CARD or IMMUNOTHERAPY ALERT CARD at check-in  to the Emergency Department and triage nurse. ? ?Should you have questions after your visit or need to cancel or reschedule your appointment, please contact [facility]  Dept: [phone]  and follow the prompts.  Office hours are 8:00 a.m. to 4:30 p.m. Monday - Friday. Please note that voicemails left after 4:00 p.m. may not be returned until the following business day.  We are closed weekends and major holidays. You have access to a nurse at all times for urgent questions. Please call the main number to the clinic Dept: [phone] and follow the prompts. ? ? ?For any non-urgent questions, you may also contact your provider using MyChart. We now offer e-Visits for anyone 18 and older to request care online for non-urgent symptoms. For details visit [email or web address]. ?  ?Also download the MyChart app! Go to the app store, search "MyChart", open the app, select , and log in with your MyChart username and password. ? ?Due to Covid, a mask is required upon entering the hospital/clinic. If you do not have a mask, one will be given to you upon arrival. For doctor visits, patients may have 1 support person aged 18 or older with them. For treatment visits, patients cannot have anyone with them due to current Covid guidelines and our immunocompromised population.  ? ?

## 2021-09-26 ENCOUNTER — Inpatient Hospital Stay: Payer: Medicare HMO

## 2021-09-26 VITALS — BP 129/67 | HR 82 | Temp 97.8°F | Resp 17

## 2021-09-26 DIAGNOSIS — C8338 Diffuse large B-cell lymphoma, lymph nodes of multiple sites: Secondary | ICD-10-CM

## 2021-09-26 DIAGNOSIS — Z5112 Encounter for antineoplastic immunotherapy: Secondary | ICD-10-CM | POA: Diagnosis not present

## 2021-09-26 MED ORDER — PEGFILGRASTIM-CBQV 6 MG/0.6ML ~~LOC~~ SOSY
6.0000 mg | PREFILLED_SYRINGE | Freq: Once | SUBCUTANEOUS | Status: AC
  Administered 2021-09-26: 6 mg via SUBCUTANEOUS

## 2021-09-26 NOTE — Progress Notes (Signed)
Changed the abdominal dressing today per patient request, minimal blood tinged drainage on the old bandage but no weeping from wound currently, no exudate of any form. Applied ABD pad with porous tape.

## 2021-09-30 ENCOUNTER — Other Ambulatory Visit: Payer: Medicare HMO

## 2021-10-07 ENCOUNTER — Other Ambulatory Visit: Payer: Medicare HMO

## 2021-10-12 ENCOUNTER — Other Ambulatory Visit: Payer: Self-pay | Admitting: Urology

## 2021-10-13 ENCOUNTER — Other Ambulatory Visit: Payer: Self-pay

## 2021-10-13 ENCOUNTER — Encounter (HOSPITAL_BASED_OUTPATIENT_CLINIC_OR_DEPARTMENT_OTHER): Payer: Medicare HMO | Attending: Internal Medicine | Admitting: Internal Medicine

## 2021-10-13 DIAGNOSIS — X58XXXA Exposure to other specified factors, initial encounter: Secondary | ICD-10-CM | POA: Insufficient documentation

## 2021-10-13 DIAGNOSIS — Z8572 Personal history of non-Hodgkin lymphomas: Secondary | ICD-10-CM | POA: Diagnosis not present

## 2021-10-13 DIAGNOSIS — G4730 Sleep apnea, unspecified: Secondary | ICD-10-CM | POA: Diagnosis not present

## 2021-10-13 DIAGNOSIS — I87313 Chronic venous hypertension (idiopathic) with ulcer of bilateral lower extremity: Secondary | ICD-10-CM | POA: Diagnosis present

## 2021-10-13 DIAGNOSIS — S31103A Unspecified open wound of abdominal wall, right lower quadrant without penetration into peritoneal cavity, initial encounter: Secondary | ICD-10-CM | POA: Diagnosis not present

## 2021-10-13 DIAGNOSIS — I482 Chronic atrial fibrillation, unspecified: Secondary | ICD-10-CM | POA: Insufficient documentation

## 2021-10-13 DIAGNOSIS — I4891 Unspecified atrial fibrillation: Secondary | ICD-10-CM | POA: Diagnosis not present

## 2021-10-13 DIAGNOSIS — I2510 Atherosclerotic heart disease of native coronary artery without angina pectoris: Secondary | ICD-10-CM | POA: Diagnosis not present

## 2021-10-13 DIAGNOSIS — N189 Chronic kidney disease, unspecified: Secondary | ICD-10-CM | POA: Diagnosis not present

## 2021-10-13 DIAGNOSIS — Z9221 Personal history of antineoplastic chemotherapy: Secondary | ICD-10-CM | POA: Diagnosis not present

## 2021-10-13 DIAGNOSIS — L97828 Non-pressure chronic ulcer of other part of left lower leg with other specified severity: Secondary | ICD-10-CM | POA: Diagnosis not present

## 2021-10-13 DIAGNOSIS — Z79899 Other long term (current) drug therapy: Secondary | ICD-10-CM | POA: Diagnosis not present

## 2021-10-13 DIAGNOSIS — L97818 Non-pressure chronic ulcer of other part of right lower leg with other specified severity: Secondary | ICD-10-CM | POA: Diagnosis not present

## 2021-10-13 DIAGNOSIS — I129 Hypertensive chronic kidney disease with stage 1 through stage 4 chronic kidney disease, or unspecified chronic kidney disease: Secondary | ICD-10-CM | POA: Diagnosis not present

## 2021-10-13 DIAGNOSIS — Z7901 Long term (current) use of anticoagulants: Secondary | ICD-10-CM | POA: Insufficient documentation

## 2021-10-13 DIAGNOSIS — Z7902 Long term (current) use of antithrombotics/antiplatelets: Secondary | ICD-10-CM | POA: Insufficient documentation

## 2021-10-13 DIAGNOSIS — E1122 Type 2 diabetes mellitus with diabetic chronic kidney disease: Secondary | ICD-10-CM | POA: Insufficient documentation

## 2021-10-13 NOTE — Progress Notes (Signed)
DAILY, CRATE ([id number]) Visit Report for 10/13/2021 Chief Complaint Document Details Patient Name: Date of Service: [name] NA TO LIY [name] 10/13/2021 9:00 [name] M Medical Record Number: [id number] Patient Account Number: [id number] Date of Birth/Sex: Treating RN: 03/01/1946 (75 y.o. Male) Rhae Hammock Primary Care Provider: RA DIO Ellen Henri Other Clinician: Referring Provider: Treating Provider/Extender: Linton Ham RA DIO Ellen Henri Weeks in Treatment: 0 Information Obtained from: Patient Chief Complaint 10/13/2021; patient is here for wounds on his bilateral lower legs and in the right inguinal area. Electronic Signature(s) Signed: 10/13/2021 5:06:47 PM By: Linton Ham MD Entered By: Linton Ham on 10/13/2021 12:47:36 -------------------------------------------------------------------------------- Debridement Details Patient Name: Date of Service: [name], [name] NA TO [name]. 10/13/2021 9:00 [name] M Medical Record Number: [id number] Patient Account Number: [id number] Date of Birth/Sex: Treating RN: 1946-05-31 (75 y.o. Male) Rhae Hammock Primary Care Provider: RA DIO Ellen Henri Other Clinician: Referring Provider: Treating Provider/Extender: Linton Ham RA DIO Ellen Henri Weeks in Treatment: 0 Debridement Performed for Assessment: Wound #1 Right,Anterior Lower Leg Performed By: Physician Ricard Dillon., MD Debridement Type: Debridement Severity of Tissue Pre Debridement: Fat layer exposed Level of Consciousness (Pre-procedure): Awake and Alert Pre-procedure Verification/Time Out Yes - 10:03 Taken: Start Time: 10:03 T Area Debrided (L x W): otal 1.2 (cm) x 1 (cm) = 1.2 (cm) Tissue and other material debrided: Viable, Non-Viable, Slough, Subcutaneous, Slough Level: Skin/Subcutaneous Tissue Debridement Description: Excisional Instrument: Curette Bleeding: Minimum Hemostasis Achieved: Pressure End Time: 10:05 Procedural Pain:  0 Post Procedural Pain: 0 Response to Treatment: Procedure was tolerated well Level of Consciousness (Post- Awake and Alert procedure): Post Debridement Measurements of Total Wound Length: (cm) 1.2 Width: (cm) 1 Depth: (cm) 0.1 Volume: (cm) 0.094 Character of Wound/Ulcer Post Debridement: Improved Severity of Tissue Post Debridement: Fat layer exposed Post Procedure Diagnosis Same as Pre-procedure Electronic Signature(s) Signed: 10/13/2021 5:06:47 PM By: Linton Ham MD Signed: 10/13/2021 5:11:56 PM By: Rhae Hammock RN Entered By: Linton Ham on 10/13/2021 12:45:10 -------------------------------------------------------------------------------- Debridement Details Patient Name: Date of Service: [name], [name] NA TO [place]. 10/13/2021 9:00 [name] M Medical Record Number: [id number] Patient Account Number: [id number] Date of Birth/Sex: Treating RN: 05-19-1946 (75 y.o. Male) Rhae Hammock Primary Care Provider: RA DIO Ellen Henri Other Clinician: Referring Provider: Treating Provider/Extender: Linton Ham RA DIO Laurena Bering, [name] LEXEI Weeks in Treatment: 0 Debridement Performed for Assessment: Wound #2 Left,Posterior Lower Leg Performed By: Physician Ricard Dillon., MD Debridement Type: Debridement Severity of Tissue Pre Debridement: Fat layer exposed Level of Consciousness (Pre-procedure): Awake and Alert Pre-procedure Verification/Time Out Yes - 10:03 Taken: Start Time: 10:03 T Area Debrided (L x W): otal 2.1 (cm) x 1.5 (cm) = 3.15 (cm) Tissue and other material debrided: Viable, Non-Viable, Eschar, Slough, Subcutaneous, Slough Level: Skin/Subcutaneous Tissue Debridement Description: Excisional Instrument: Curette Specimen: Swab, Number of Specimens T aken: 1 Bleeding: Minimum Hemostasis Achieved: Pressure End Time: 10:05 Procedural Pain: 0 Post Procedural Pain: 0 Response to Treatment: Procedure was tolerated well Level of Consciousness (Post- Awake  and Alert procedure): Post Debridement Measurements of Total Wound Length: (cm) 2.1 Width: (cm) 1.5 Depth: (cm) 0.1 Volume: (cm) 0.247 Character of Wound/Ulcer Post Debridement: Improved Severity of Tissue Post Debridement: Fat layer exposed Post Procedure Diagnosis Same as Pre-procedure Electronic Signature(s) Signed: 10/13/2021 5:06:47 PM By: Linton Ham MD Signed: 10/13/2021 5:11:56 PM By: Rhae Hammock RN Entered By: Linton Ham on 10/13/2021 12:45:25 -------------------------------------------------------------------------------- Debridement Details Patient Name: Date of Service: [name], [name]  NA TO LIY S. 10/13/2021 9:00 [name] M Medical Record Number: [id number] Patient Account Number: [id number] Date of Birth/Sex: Treating RN: 1945-12-14 (76 y.o. Male) Rhae Hammock Primary Care Provider: RA DIO Ellen Henri Other Clinician: Referring Provider: Treating Provider/Extender: Linton Ham RA DIO Laurena Bering, [name] LEXEI Weeks in Treatment: 0 Debridement Performed for Assessment: Wound #3 Right Groin Performed By: Clinician Rhae Hammock, RN Debridement Type: Chemical/Enzymatic/Mechanical Agent Used: Anasept and gauze to remove biofilm Level of Consciousness (Pre-procedure): Awake and Alert Pre-procedure Verification/Time Out Yes - 10:15 Taken: Start Time: 10:15 Bleeding: None Procedural Pain: 0 Post Procedural Pain: 0 Response to Treatment: Procedure was tolerated well Level of Consciousness (Post- Awake and Alert procedure): Post Debridement Measurements of Total Wound Length: (cm) 1 Width: (cm) 4 Depth: (cm) 0.2 Volume: (cm) 0.628 Character of Wound/Ulcer Post Debridement: Requires Further Debridement Post Procedure Diagnosis Same as Pre-procedure Electronic Signature(s) Signed: 10/13/2021 5:06:47 PM By: Linton Ham MD Signed: 10/13/2021 5:11:56 PM By: Rhae Hammock RN Entered By: Linton Ham on 10/13/2021  12:45:36 -------------------------------------------------------------------------------- HPI Details Patient Name: Date of Service: [name], [name] NA TO Stanley Royalty S. 10/13/2021 9:00 [name] M Medical Record Number: [id number] Patient Account Number: [id number] Date of Birth/Sex: Treating RN: 1946/10/17 (75 y.o. Male) Rhae Hammock Primary Care Provider: RA DIO Ellen Henri Other Clinician: Referring Provider: Treating Provider/Extender: Linton Ham RA DIO Laurena Bering, [name] LEXEI Weeks in Treatment: 0 History of Present Illness HPI Description: ADMISSION 10/13/2021 This is [name] 75 year old man who is actually from [place] speaking both [place] and [place]. We interviewed him through [name] [place] interpreter. That seemed to work quite well. The patient has had significant disease this year including diffuse large B-cell lymphoma stage III-IV. He is finished chemotherapy as directed by Dr. Lorenso Courier of oncology. Apparently he has had [name] good response. During the last visit in follow-up with Dr. Lorenso Courier on 09/23/2021 it was noted that he had bilateral lower extremity ulcers felt to be secondary to edema and the use of steroids as part of his chemotherapy. He is no longer on prednisone. He also has painful areas in the right inguinal area which apparently started as [name] large blister about 4 to 6 weeks ago and is contracted to 4 small areas according predominantly to the interpreter who seems to have been to most of his appointments. As noted he was in hospital with sepsis in July and had concerns for endocarditis treated with 6 weeks of antibiotics via PICC line. As noted he finished his [facility] EOP earlier this month. Past medical history includes diffuse large B-cell lymphoma is noted, atrial fibrillation on Eliquis and he is also on Plavix, lower extremity edema, coronary artery disease, type 2 diabetes. As noted possible endocarditis, sleep apnea and chronic [name]. fib on amiodarone His ABIs in our clinic were actually  quite good at 1.07 and 1.04 Electronic Signature(s) Signed: 10/13/2021 5:06:47 PM By: Linton Ham MD Entered By: Linton Ham on 10/13/2021 12:51:14 -------------------------------------------------------------------------------- Physical Exam Details Patient Name: Date of Service: [name], [name] NA TO Stanley S. 10/13/2021 9:00 [name] M Medical Record Number: [id number] Patient Account Number: [id number] Date of Birth/Sex: Treating RN: 1946/06/24 (75 y.o. Male) Rhae Hammock Primary Care Provider: RA DIO Ellen Henri Other Clinician: Referring Provider: Treating Provider/Extender: Linton Ham RA DIO Laurena Bering, [name] LEXEI Weeks in Treatment: 0 Constitutional Sitting or standing Blood Pressure is within target range for patient.. Pulse regular and within target range for patient.Marland Kitchen Respirations regular, non-labored and within target range.. Temperature is normal and within the target  range for the patient.Marland Kitchen Appears in no distress. Respiratory work of breathing is normal. Bilateral breath sounds are clear and equal in all lobes with no wheezes, rales or rhonchi.. Cardiovascular No evidence of CHF. No elevation of jugular venous pressure no sacral edema heart sounds are normal short midsystolic ejection murmur. Pedal pulses palpable and strong bilaterally.. 2-3+ pitting edema to both lower legs extending just above the knees. [name] lot of this looks like chronic venous insufficiency.. Notes Wound exam; somewhat unusual wounds bilaterally in his lower legs. He has small pustular areas on the right medial mid calf area and also blisters on the left posterior. These apparently have been there for about 6 to 7 weeks. He also has 4 raised what appear to be nodules which are bluish in color in the right inguinal area clustered together. Very tender this does not appear to be infected. He has had IV and oral antibiotics but I do not see any need to start antibiotics today The areas on both legs eyes  gently debrided with [name] #3 curette to [name] cleaner surface. One of the wounds on the left posterior calf had [name] murky red-colored interior which I did culture Electronic Signature(s) Signed: 10/13/2021 5:06:47 PM By: Linton Ham MD Entered By: Linton Ham on 10/13/2021 12:53:54 -------------------------------------------------------------------------------- Physician Orders Details Patient Name: Date of Service: [name], [name] NA TO [place] S. 10/13/2021 9:00 [name] M Medical Record Number: [id number] Patient Account Number: [id number] Date of Birth/Sex: Treating RN: 05/27/1946 (75 y.o. Male) Levan Hurst Primary Care Provider: RA DIO Ellen Henri Other Clinician: Referring Provider: Treating Provider/Extender: Linton Ham RA DIO Laurena Bering, [name] LEXEI Weeks in Treatment: 0 Verbal / Phone Orders: No Diagnosis Coding Follow-up Appointments ppointment in 1 week. - Dr. Dellia Nims Return [name] Bathing/ Shower/ Hygiene May shower with protection but do not get wound dressing(s) wet. - Ok to use [employer], can purchase at [facility], [facility], or [employer] Edema Control - Lymphedema / SCD / Other Elevate legs to the level of the heart or above for 30 minutes daily and/or when sitting, [name] frequency of: - throughout the day Avoid standing for long periods of time. Exercise regularly Wound Treatment Wound #1 - Lower Leg Wound Laterality: Right, Anterior Cleanser: Soap and Water 1 x Per Week Discharge Instructions: May shower and wash wound with dial antibacterial soap and water prior to dressing change. Peri-Wound Care: Sween Lotion (Moisturizing lotion) 1 x Per Week Discharge Instructions: Apply moisturizing lotion as directed Prim Dressing: KerraCel Ag Gelling Fiber Dressing, 2x2 in (silver alginate) 1 x Per Week ary Discharge Instructions: Apply silver alginate to wound bed as instructed Secondary Dressing: Woven Gauze Sponge, Non-Sterile 4x4 in 1 x Per Week Discharge Instructions: Apply over primary  dressing as directed. Secondary Dressing: ABD Pad, 8x10 1 x Per Week Discharge Instructions: Apply over primary dressing as directed. Compression Wrap: CoFlex TLC XL 2-layer Compression System 4x7 (in/yd) 1 x Per Week Discharge Instructions: Apply CoFlex 2-layer compression as directed. (alt for 4 layer) Wound #2 - Lower Leg Wound Laterality: Left, Posterior Cleanser: Soap and Water 1 x Per Week Discharge Instructions: May shower and wash wound with dial antibacterial soap and water prior to dressing change. Peri-Wound Care: Sween Lotion (Moisturizing lotion) 1 x Per Week Discharge Instructions: Apply moisturizing lotion as directed Prim Dressing: KerraCel Ag Gelling Fiber Dressing, 2x2 in (silver alginate) 1 x Per Week ary Discharge Instructions: Apply silver alginate to wound bed as instructed Secondary Dressing: Woven Gauze Sponge, Non-Sterile 4x4 in 1  x Per Week Discharge Instructions: Apply over primary dressing as directed. Secondary Dressing: ABD Pad, 8x10 1 x Per Week Discharge Instructions: Apply over primary dressing as directed. Compression Wrap: CoFlex TLC XL 2-layer Compression System 4x7 (in/yd) 1 x Per Week Discharge Instructions: Apply CoFlex 2-layer compression as directed. (alt for 4 layer) Wound #3 - Groin Wound Laterality: Right Cleanser: Soap and Water 1 x Per Day/15 Days Discharge Instructions: May shower and wash wound with dial antibacterial soap and water prior to dressing change. Cleanser: [place] Ancillary Kit - 15 Day Supply (DME) (Generic) 1 x Per Day/15 Days Discharge Instructions: Use supplies as instructed; Kit contains: (15) Saline Bullets; (15) 3x3 Gauze; 15 pr Gloves Prim Dressing: KerraCel Ag Gelling Fiber Dressing, 2x2 in (silver alginate) (DME) (Generic) 1 x Per Day/15 Days ary Discharge Instructions: Apply silver alginate to wound bed as instructed Secondary Dressing: Woven Gauze Sponge, Non-Sterile 4x4 in (DME) (Generic) 1 x Per Day/15 Days Discharge  Instructions: Apply over primary dressing as directed. Secondary Dressing: Bordered Gauze, 4x4 in (DME) (Generic) 1 x Per Day/15 Days Discharge Instructions: Apply over primary dressing as directed. Laboratory naerobe culture (MICRO) - Left posterior lower leg Bacteria identified in Unspecified specimen by [name] LOINC Code: 295-6 Convenience Name: Anerobic culture Electronic Signature(s) Signed: 10/13/2021 5:06:47 PM By: Linton Ham MD Signed: 10/13/2021 5:45:41 PM By: Levan Hurst RN, BSN Entered By: Levan Hurst on 10/13/2021 11:04:56 -------------------------------------------------------------------------------- Problem List Details Patient Name: Date of Service: [name], [name] NA TO LIY S. 10/13/2021 9:00 [name] M Medical Record Number: [id number] Patient Account Number: [id number] Date of Birth/Sex: Treating RN: 1945-12-24 (75 y.o. Male) Rhae Hammock Primary Care Provider: Other Clinician: RA DIO Ellen Henri Referring Provider: Treating Provider/Extender: Linton Ham RA DIO Ellen Henri Weeks in Treatment: 0 Active Problems ICD-10 Encounter Code Description Active Date MDM Diagnosis L97.828 Non-pressure chronic ulcer of other part of left lower leg with other specified 10/13/2021 No Yes severity L97.818 Non-pressure chronic ulcer of other part of right lower leg with other specified 10/13/2021 No Yes severity S31.103D Unspecified open wound of abdominal wall, right lower quadrant without 10/13/2021 No Yes penetration into peritoneal cavity, subsequent encounter I87.313 Chronic venous hypertension (idiopathic) with ulcer of bilateral lower extremity 10/13/2021 No Yes C83.30 Diffuse large B-cell lymphoma, unspecified site 10/13/2021 No Yes Inactive Problems Resolved Problems Electronic Signature(s) Signed: 10/13/2021 5:06:47 PM By: Linton Ham MD Entered By: Linton Ham on 10/13/2021  12:44:41 -------------------------------------------------------------------------------- Progress Note Details Patient Name: Date of Service: [name], [name] NA TO Stanley Royalty S. 10/13/2021 9:00 [name] M Medical Record Number: [id number] Patient Account Number: [id number] Date of Birth/Sex: Treating RN: [birth year] (75 y.o. Male) Rhae Hammock Primary Care Provider: RA DIO Ellen Henri Other Clinician: Referring Provider: Treating Provider/Extender: Linton Ham RA DIO Laurena Bering, [name] LEXEI Weeks in Treatment: 0 Subjective Chief Complaint Information obtained from Patient 10/13/2021; patient is here for wounds on his bilateral lower legs and in the right inguinal area. History of Present Illness (HPI) ADMISSION 10/13/2021 This is [name] 75 year old man who is actually from [place] speaking both [place] and [place]. We interviewed him through [name] [place] interpreter. That seemed to work quite well. The patient has had significant disease this year including diffuse large B-cell lymphoma stage III-IV. He is finished chemotherapy as directed by Dr. Lorenso Courier of oncology. Apparently he has had [name] good response. During the last visit in follow-up with Dr. Lorenso Courier on 09/23/2021 it was noted that he had bilateral lower extremity ulcers felt to be secondary to  edema and the use of steroids as part of his chemotherapy. He is no longer on prednisone. He also has painful areas in the right inguinal area which apparently started as [name] large blister about 4 to 6 weeks ago and is contracted to 4 small areas according predominantly to the interpreter who seems to have been to most of his appointments. As noted he was in hospital with sepsis in July and had concerns for endocarditis treated with 6 weeks of antibiotics via PICC line. As noted he finished his [facility] EOP earlier this month. Past medical history includes diffuse large B-cell lymphoma is noted, atrial fibrillation on Eliquis and he is also on Plavix, lower extremity  edema, coronary artery disease, type 2 diabetes. As noted possible endocarditis, sleep apnea and chronic [name]. fib on amiodarone His ABIs in our clinic were actually quite good at 1.07 and 1.04 Patient History Information obtained from Patient. Allergies No Known Drug Allergies Family History Unknown History. Social History Never smoker, Marital Status - Married, Alcohol Use - Never, Drug Use - No History, Caffeine Use - Rarely. Medical History Respiratory Patient has history of Sleep Apnea Cardiovascular Patient has history of Arrhythmia - [name]-Fib, Coronary Artery Disease, Hypertension Endocrine Patient has history of Type II Diabetes Integumentary (Skin) Denies history of History of Burn Oncologic Patient has history of Received Chemotherapy Patient is treated with Insulin, Oral Agents. Blood sugar is tested. Medical [name] Surgical History Notes nd Genitourinary CKD Oncologic B-Cell Lymphoma Review of Systems (ROS) Constitutional Symptoms (General Health) Denies complaints or symptoms of Fatigue, Fever, Chills, Marked Weight Change. Eyes Denies complaints or symptoms of Dry Eyes, Vision Changes, Glasses / Contacts. Ear/Nose/Mouth/Throat Denies complaints or symptoms of Chronic sinus problems or rhinitis. Gastrointestinal Denies complaints or symptoms of Frequent diarrhea, Nausea, Vomiting. Genitourinary Denies complaints or symptoms of Frequent urination. Integumentary (Skin) Complains or has symptoms of Wounds - wounds on lower legs and right groin. Musculoskeletal Denies complaints or symptoms of Muscle Pain, Muscle Weakness. Neurologic Denies complaints or symptoms of Numbness/parasthesias. Psychiatric Denies complaints or symptoms of Claustrophobia, Suicidal. Objective Constitutional Sitting or standing Blood Pressure is within target range for patient.. Pulse regular and within target range for patient.Marland Kitchen Respirations regular, non-labored and within target range..  Temperature is normal and within the target range for the patient.Marland Kitchen Appears in no distress. Vitals Time Taken: 9:10 AM, Temperature: 98.4 F, Pulse: 82 bpm, Respiratory Rate: 16 breaths/min, Blood Pressure: 146/64 mmHg. Respiratory work of breathing is normal. Bilateral breath sounds are clear and equal in all lobes with no wheezes, rales or rhonchi.. Cardiovascular No evidence of CHF. No elevation of jugular venous pressure no sacral edema heart sounds are normal short midsystolic ejection murmur. Pedal pulses palpable and strong bilaterally.. 2-3+ pitting edema to both lower legs extending just above the knees. [name] lot of this looks like chronic venous insufficiency.. General Notes: Wound exam; somewhat unusual wounds bilaterally in his lower legs. He has small pustular areas on the right medial mid calf area and also blisters on the left posterior. These apparently have been there for about 6 to 7 weeks. oo He also has 4 raised what appear to be nodules which are bluish in color in the right inguinal area clustered together. Very tender this does not appear to be infected. He has had IV and oral antibiotics but I do not see any need to start antibiotics today oo The areas on both legs eyes gently debrided with [name] #3 curette to [name] cleaner surface. One of  the wounds on the left posterior calf had [name] murky red-colored interior which I did culture Integumentary (Hair, Skin) Wound #1 status is Open. Original cause of wound was Gradually Appeared. The date acquired was: 07/16/2021. The wound is located on the Right,Anterior Lower Leg. The wound measures 1.2cm length x 1cm width x 0.1cm depth; 0.942cm^2 area and 0.094cm^3 volume. There is Fat Layer (Subcutaneous Tissue) exposed. There is no tunneling or undermining noted. There is [name] medium amount of serous drainage noted. The wound margin is flat and intact. There is small (1-33%) pink granulation within the wound bed. There is [name] large (67-100%) amount of  necrotic tissue within the wound bed including Adherent Slough. Wound #2 status is Open. Original cause of wound was Gradually Appeared. The date acquired was: 09/28/2021. The wound is located on the Left,Posterior Lower Leg. The wound measures 2.1cm length x 1.5cm width x 0.1cm depth; 2.474cm^2 area and 0.247cm^3 volume. There is no tunneling or undermining noted. There is [name] medium amount of serosanguineous drainage noted. The wound margin is flat and intact. There is no granulation within the wound bed. There is [name] large (67-100%) amount of necrotic tissue within the wound bed including Eschar and Adherent Slough. Wound #3 status is Open. Original cause of wound was Gradually Appeared. The date acquired was: 07/16/2021. The wound is located on the Right Groin. The wound measures 1cm length x 4cm width x 0.2cm depth; 3.142cm^2 area and 0.628cm^3 volume. There is Fat Layer (Subcutaneous Tissue) exposed. There is no tunneling or undermining noted. There is [name] medium amount of serosanguineous drainage noted. The wound margin is flat and intact. There is small (1-33%) pink granulation within the wound bed. There is [name] large (67-100%) amount of necrotic tissue within the wound bed including Adherent Slough. Assessment Active Problems ICD-10 Non-pressure chronic ulcer of other part of left lower leg with other specified severity Non-pressure chronic ulcer of other part of right lower leg with other specified severity Unspecified open wound of abdominal wall, right lower quadrant without penetration into peritoneal cavity, subsequent encounter Chronic venous hypertension (idiopathic) with ulcer of bilateral lower extremity Diffuse large B-cell lymphoma, unspecified site Procedures Wound #1 Pre-procedure diagnosis of Wound #1 is [name] Venous Leg Ulcer located on the Right,Anterior Lower Leg .Severity of Tissue Pre Debridement is: Fat layer exposed. There was [name] Excisional Skin/Subcutaneous Tissue Debridement with  [name] total area of 1.2 sq cm performed by Ricard Dillon., MD. With the following instrument(s): Curette to remove Viable and Non-Viable tissue/material. Material removed includes Subcutaneous Tissue and Slough and. No specimens were taken. [name] time out was conducted at 10:03, prior to the start of the procedure. [name] Minimum amount of bleeding was controlled with Pressure. The procedure was tolerated well with [name] pain level of 0 throughout and [name] pain level of 0 following the procedure. Post Debridement Measurements: 1.2cm length x 1cm width x 0.1cm depth; 0.094cm^3 volume. Character of Wound/Ulcer Post Debridement is improved. Severity of Tissue Post Debridement is: Fat layer exposed. 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 There was [name] Double Layer Compression Therapy Procedure by Levan Hurst, RN. 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 There was [name] Excisional Skin/Subcutaneous Tissue Debridement with [name] total area of  3.15 sq cm performed by Ricard Dillon., MD. With the following instrument(s): Curette to remove Viable and Non-Viable tissue/material. Material removed includes Eschar, Subcutaneous Tissue, and Slough. 1 specimen was taken by [name] Swab and sent to the lab per facility protocol. [name] time out was conducted at 10:03, prior to the start of the procedure. [name] Minimum amount of bleeding was controlled with Pressure. The procedure was tolerated well with [name] pain level of 0 throughout and [name] pain level of 0 following the procedure. Post Debridement Measurements: 2.1cm length x 1.5cm width x 0.1cm depth; 0.247cm^3 volume. Character of Wound/Ulcer Post Debridement is improved. Severity of Tissue Post  Debridement is: Fat layer exposed. Post procedure Diagnosis Wound #2: Same as Pre-Procedure Pre-procedure diagnosis of Wound #2 is [name] Venous Leg Ulcer located on the Left,Posterior Lower Leg . There was [name] Double Layer Compression Therapy Procedure by Levan Hurst, RN. Post procedure Diagnosis Wound #2: Same as Pre-Procedure Wound #3 Pre-procedure diagnosis of Wound #3 is an Atypical located on the Right Groin . There was [name] Chemical/Enzymatic/Mechanical debridement performed by Rhae Hammock, RN.. Other agent used was Anasept and gauze to remove biofilm. [name] time out was conducted at 10:15, prior to the start of the procedure. There was no bleeding. The procedure was tolerated well with [name] pain level of 0 throughout and [name] pain level of 0 following the procedure. Post Debridement Measurements: 1cm length x 4cm width x 0.2cm depth; 0.628cm^3 volume. Character of Wound/Ulcer Post Debridement requires further debridement. Post procedure Diagnosis Wound #3: Same as Pre-Procedure Plan Follow-up Appointments: Return Appointment in 1 week. - Dr. Dellia Nims Bathing/ Shower/ Hygiene: May shower with protection but do not get wound dressing(s) wet. - Ok to use Market researcher, can purchase at [facility], [facility], or [employer] Edema Control - Lymphedema / SCD / Other: Elevate legs to the level of the heart or above for 30 minutes daily and/or when sitting, [name] frequency of: - throughout the day Avoid standing for long periods of time. Exercise regularly Laboratory ordered were: Anerobic culture - Left posterior lower leg WOUND #1: - Lower Leg Wound Laterality: Right, Anterior Cleanser: Soap and Water 1 x Per Week/ Discharge Instructions: May shower and wash wound with dial antibacterial soap and water prior to dressing change. Peri-Wound Care: Sween Lotion (Moisturizing lotion) 1 x Per Week/ Discharge Instructions: Apply moisturizing lotion as directed Prim Dressing: KerraCel Ag Gelling Fiber Dressing, 2x2 in  (silver alginate) 1 x Per Week/ ary Discharge Instructions: Apply silver alginate to wound bed as instructed Secondary Dressing: Woven Gauze Sponge, Non-Sterile 4x4 in 1 x Per Week/ Discharge Instructions: Apply over primary dressing as directed. Secondary Dressing: ABD Pad, 8x10 1 x Per Week/ Discharge Instructions: Apply over primary dressing as directed. Com pression Wrap: CoFlex TLC XL 2-layer Compression System 4x7 (in/yd) 1 x Per Week/ Discharge Instructions: Apply CoFlex 2-layer compression as directed. (alt for 4 layer) WOUND #2: - Lower Leg Wound Laterality: Left, Posterior Cleanser: Soap and Water 1 x Per Week/ Discharge Instructions: May shower and wash wound with dial antibacterial soap and water prior to dressing change. Peri-Wound Care: Sween Lotion (Moisturizing lotion) 1 x Per Week/ Discharge Instructions: Apply moisturizing lotion as directed Prim Dressing: KerraCel Ag Gelling Fiber Dressing, 2x2 in (silver alginate) 1 x Per Week/ ary Discharge Instructions: Apply silver alginate to wound bed as instructed Secondary Dressing: Woven Gauze Sponge, Non-Sterile 4x4 in 1 x Per Week/ Discharge Instructions: Apply over primary dressing as directed. Secondary Dressing: ABD Pad, 8x10 1  x Per Week/ Discharge Instructions: Apply over primary dressing as directed. Com pression Wrap: CoFlex TLC XL 2-layer Compression System 4x7 (in/yd) 1 x Per Week/ Discharge Instructions: Apply CoFlex 2-layer compression as directed. (alt for 4 layer) WOUND #3: - Groin Wound Laterality: Right Cleanser: Soap and Water 1 x Per JGO/11 Days Discharge Instructions: May shower and wash wound with dial antibacterial soap and water prior to dressing change. Cleanser: [place] Ancillary Kit - 15 Day Supply (DME) (Generic) 1 x Per Day/15 Days Discharge Instructions: Use supplies as instructed; Kit contains: (15) Saline Bullets; (15) 3x3 Gauze; 15 pr Gloves Prim Dressing: KerraCel Ag Gelling Fiber Dressing, 2x2 in  (silver alginate) (DME) (Generic) 1 x Per Day/15 Days ary Discharge Instructions: Apply silver alginate to wound bed as instructed Secondary Dressing: Woven Gauze Sponge, Non-Sterile 4x4 in (DME) (Generic) 1 x Per Day/15 Days Discharge Instructions: Apply over primary dressing as directed. Secondary Dressing: Bordered Gauze, 4x4 in (DME) (Generic) 1 x Per Day/15 Days Discharge Instructions: Apply over primary dressing as directed. 1. I use silver alginate on all wound areas. I put his bilateral legs and 4-layer compression. I do not think there was any worry about PAD 2. I will see if the wounds on his legs respond in the usual way to reduction in swelling perhaps treatment of chronic venous insufficiency wounds. 3. The area in the right inguinal area is very unusual. This does not look like intertrigo or any of the related areas that people get in the groin. These are 4 small painful nodular areas. I found myself wondering whether these could be malignant and I discussed this with him. 4. I would like Dr. Libby Maw input into some of this especially if the wounds deteriorate or do not respond to standard treatment in either area. Dr. Lorenso Courier commented that he felt the wounds could be secondary to prednisone although the patient's not been on these in [name] while. 5. According to the patient and his interpreter. Things actually have been getting better. And I decided I would just dress these appropriately and see if things respond. Biopsying the areas especially in the inguinal area would not be easy as the patient is on blood thinners and antiplatelet drugs I spent 45 minutes in review of this patient's past medical history, face-to-face evaluation and preparation of this record. I would like to review his last hospitalization to see if he had relevant consultation i.e. vascular etc. Electronic Signature(s) Signed: 10/13/2021 5:06:47 PM By: Linton Ham MD Entered By: Linton Ham on 10/13/2021  12:56:42 -------------------------------------------------------------------------------- HxROS Details Patient Name: Date of Service: [name], [name] NA TO [place] S. 10/13/2021 9:00 [name] M Medical Record Number: [id number] Patient Account Number: [id number] Date of Birth/Sex: Treating RN: 1946-03-07 (75 y.o. Male) Levan Hurst Primary Care Provider: RA DIO Ellen Henri Other Clinician: Referring Provider: Treating Provider/Extender: Linton Ham RA DIO Laurena Bering, [name] LEXEI Weeks in Treatment: 0 Information Obtained From Patient Constitutional Symptoms (General Health) Complaints and Symptoms: Negative for: Fatigue; Fever; Chills; Marked Weight Change Eyes Complaints and Symptoms: Negative for: Dry Eyes; Vision Changes; Glasses / Contacts Ear/Nose/Mouth/Throat Complaints and Symptoms: Negative for: Chronic sinus problems or rhinitis Gastrointestinal Complaints and Symptoms: Negative for: Frequent diarrhea; Nausea; Vomiting Genitourinary Complaints and Symptoms: Negative for: Frequent urination Medical History: Past Medical History Notes: CKD Integumentary (Skin) Complaints and Symptoms: Positive for: Wounds - wounds on lower legs and right groin Medical History: Negative for: History of Burn Musculoskeletal Complaints and Symptoms: Negative for: Muscle Pain; Muscle Weakness Neurologic  Complaints and Symptoms: Negative for: Numbness/parasthesias Psychiatric Complaints and Symptoms: Negative for: Claustrophobia; Suicidal Hematologic/Lymphatic Respiratory Medical History: Positive for: Sleep Apnea Cardiovascular Medical History: Positive for: Arrhythmia - [name]-Fib; Coronary Artery Disease; Hypertension Endocrine Medical History: Positive for: Type II Diabetes Treated with: Insulin, Oral agents Blood sugar tested every day: Yes Tested : once [name] day Immunological Oncologic Medical History: Positive for: Received Chemotherapy Past Medical History Notes: B-Cell  Lymphoma Immunizations Pneumococcal Vaccine: Received Pneumococcal Vaccination: No Implantable Devices None Family and Social History Unknown History: Yes; Never smoker; Marital Status - Married; Alcohol Use: Never; Drug Use: No History; Caffeine Use: Rarely; Financial Concerns: No; Food, Clothing or Shelter Needs: No; Support System Lacking: No; Transportation Concerns: No Electronic Signature(s) Signed: 10/13/2021 5:06:47 PM By: Linton Ham MD Signed: 10/13/2021 5:45:41 PM By: Levan Hurst RN, BSN Entered By: Levan Hurst on 10/13/2021 09:39:12 -------------------------------------------------------------------------------- [place] Details Patient Name: Date of Service: [name], [name] NA TO [name] 10/13/2021 Medical Record Number: [id number] Patient Account Number: [id number] Date of Birth/Sex: Treating RN: 1946-02-16 (75 y.o. 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, unspecified site Facility Procedures CPT4 Code: 60630160 Description: [place] VISIT-LEV 3 EST PT Modifier: 25 Quantity: 1 CPT4 Code: 10932355 Description: 73220 - DEBRIDE W/O ANES NON SELECT Modifier: Quantity: 1 CPT4 Code: 25427062 Description: 37628 - DEB SUBQ TISSUE 20 SQ CM/< ICD-10 Diagnosis Description L97.828 Non-pressure chronic  ulcer of other part of left lower leg with other specified s Modifier: everity Quantity: 1 Physician Procedures : CPT4 Code Description Modifier [id number] 60737 - WC PHYS LEVEL 4 - NEW PT 25 ICD-10 Diagnosis Description L97.828 Non-pressure chronic ulcer of other part of left lower leg with other specified severity L97.818 Non-pressure chronic ulcer of other part of  right lower leg with other specified severity S31.103D Unspecified open wound of abdominal wall, right lower quadrant without penetration into peritoneal cavit subsequent encounter I87.313 Chronic venous hypertension (idiopathic) with ulcer of bilateral  lower extremity Quantity: 1 y, Engineer, maintenance) Signed: 10/13/2021 5:06:47 PM By: Linton Ham MD Signed: 10/13/2021 5:45:41 PM By: Levan Hurst RN, BSN Entered By: Levan Hurst on 10/13/2021 16:06:30

## 2021-10-13 NOTE — Progress Notes (Signed)
[name], [name] ([id number]) Visit Report for 10/13/2021 Abuse/Suicide Risk Screen Details Patient Name: Date of Service: [name] TO [place] S. 10/13/2021 9:00 A M Medical Record Number: [id number] Patient Account Number: [id number] Date of Birth/Sex: Treating RN: [birth year] (75 y.o. Male) [name] Primary Care [name]: RA DIO [name] Other Clinician: Referring [name]: Treating [name] RA DIO Laurena Bering, A LEXEI Weeks in Treatment: 0 Abuse/Suicide Risk Screen Items Answer ABUSE RISK SCREEN: Has anyone close to you tried to hurt or harm you recentlyo No Do you feel uncomfortable with anyone in your familyo No Has anyone forced you do things that you didnt want to doo No Electronic Signature(s) Signed: 10/13/2021 5:45:41 PM By: Stanley Hurst RN, BSN Entered By: [name] on 10/13/2021 09:39:17 -------------------------------------------------------------------------------- Activities of Daily Living Details Patient Name: Date of Service: [name] TO [place] S. 10/13/2021 9:00 A M Medical Record Number: [id number] Patient Account Number: [id number] Date of Birth/Sex: Treating RN: 1946-05-14 75 y.o. Male) [name] Primary Care Estefani Bateson: RA DIO [name] Other Clinician: Referring Monserrate Blaschke: Treating Orian Amberg/Extender: [name] RA DIO Laurena Bering, A LEXEI Weeks in Treatment: 0 Activities of Daily Living Items Answer Activities of Daily Living (Please select one for each item) Drive Automobile Not Able T Medications ake Need Assistance Use T elephone Need Assistance Care for Appearance Need Assistance Use T oilet Need Assistance Bath / Shower Need Assistance Dress Self Need Assistance Feed Self Need Assistance Walk Need Assistance Get In / Out Bed Need Assistance Housework Need Assistance Prepare Meals Need Assistance Handle Money Need Assistance Shop for Self Need Assistance Electronic  Signature(s) Signed: 10/13/2021 5:45:41 PM By: Stanley Hurst RN, BSN Entered By: [name] on 10/13/2021 09:39:45 -------------------------------------------------------------------------------- Education Screening Details Patient Name: Date of Service: [name] [name] TO [name] S. 10/13/2021 9:00 A M Medical Record Number: [id number] Patient Account Number: [id number] Date of Birth/Sex: Treating RN: 06-19-1946 (75 y.o. Male) [name] Primary Care Carollynn Pennywell: RA DIO [name] Other Clinician: Referring Brighten Orndoff: Treating Damari Hiltz/Extender: [name] RA DIO [name] Weeks in Treatment: 0 Primary Learner Assessed: Patient Learning Preferences/Education Level/Primary Language Learning Preference: Explanation, Demonstration, Printed Material Highest Education Level: High School Preferred Language: Other: [name] Cognitive Barrier Language Barrier: No Translator Needed: Yes Hospital Employed Language Interpreter Memory Deficit: No Emotional Barrier: No Cultural/Religious Beliefs Affecting Medical Care: No Physical Barrier Impaired Vision: No Impaired Hearing: No Decreased Hand dexterity: No Knowledge/Comprehension Knowledge Level: High Comprehension Level: High Ability to understand written instructions: High Ability to understand verbal instructions: High Motivation Anxiety Level: Calm Cooperation: Cooperative Education Importance: Acknowledges Need Interest in Health Problems: Asks Questions Perception: Coherent Willingness to Engage in Self-Management High Activities: Readiness to Engage in Self-Management High Activities: Electronic Signature(s) Signed: 10/13/2021 5:45:41 PM By: Stanley Hurst RN, BSN Entered By: [name] on 10/13/2021 09:40:17 -------------------------------------------------------------------------------- Fall Risk Assessment Details Patient Name: Date of Service: [name] TO Stanley S. 10/13/2021 9:00 A  M Medical Record Number: [id number] Patient Account Number: [id number] Date of Birth/Sex: Treating RN: [birth year] (75 y.o. Male) [name] Primary Care Leah Skora: RA DIO [name] Other Clinician: Referring Beacher Every: Treating Rosio Weiss/Extender: [name] RA DIO Laurena Bering, A LEXEI Weeks in Treatment: 0 Fall Risk Assessment Items Have you had 2 or more falls in the last 12 monthso 0 Yes Have you had any fall that resulted in injury in the last 12 monthso 0 Yes FALLS RISK SCREEN History of falling - immediate or within  3 months 25 Yes Secondary diagnosis (Do you have 2 or more medical diagnoseso) 15 Yes Ambulatory aid None/bed rest/wheelchair/nurse 0 No Crutches/cane/walker 15 Yes Furniture 0 No Intravenous therapy Access/Saline/Heparin Lock 0 No Gait/Transferring Normal/ bed rest/ wheelchair 0 No Weak (short steps with or without shuffle, stooped but able to lift head while walking, may seek 10 Yes support from furniture) Impaired (short steps with shuffle, may have difficulty arising from chair, head down, impaired 0 No balance) Mental Status Oriented to own ability 0 Yes Electronic Signature(s) Signed: 10/13/2021 5:45:41 PM By: Stanley Hurst RN, BSN Entered By: [name] on 10/13/2021 09:40:33 -------------------------------------------------------------------------------- Foot Assessment Details Patient Name: Date of Service: [name], A [name] TO [name] S. 10/13/2021 9:00 [place] Record Number: [id number] Patient Account Number: [id number] Date of Birth/Sex: Treating RN: 1945-12-05 (75 y.o. Male) [name] Primary Care Tylar Merendino: RA DIO [name] Other Clinician: Referring [name]: Treating Essa Malachi/Extender: [name] RA DIO Laurena Bering, A LEXEI Weeks in Treatment: 0 Foot Assessment Items Site Locations + = Sensation present, - = Sensation absent, C = Callus, U = Ulcer R = Redness, W = Warmth, M = Maceration, PU = Pre-ulcerative  lesion F = Fissure, S = Swelling, D = Dryness Assessment Right: Left: Other Deformity: No No Prior Foot Ulcer: No No Prior Amputation: No No Charcot Joint: No No Ambulatory Status: Ambulatory With Help Assistance Device: Cane Gait: Administrator, arts) Signed: 10/13/2021 5:45:41 PM By: Stanley Hurst RN, BSN Entered By: [name] on 10/13/2021 09:41:14 -------------------------------------------------------------------------------- Nutrition Risk Screening Details Patient Name: Date of Service: [name] TO [place] S. 10/13/2021 9:00 A M Medical Record Number: [id number] Patient Account Number: [id number] Date of Birth/Sex: Treating RN: 1946/04/30 (75 y.o. Male) [name] Primary Care Lezlee Gills: RA DIO [name] Other Clinician: Referring Hanna Ra: Treating Nakaiya Beddow/Extender: [name] RA DIO Laurena Bering, A LEXEI Weeks in Treatment: 0 Height (in): Weight (lbs): Body Mass Index (BMI): Nutrition Risk Screening Items Score Screening NUTRITION RISK SCREEN: I have an illness or condition that made me change the kind and/or amount of food I eat 2 Yes I eat fewer than two meals per day 0 No I eat few fruits and vegetables, or milk products 0 No I have three or more drinks of beer, liquor or wine almost every day 0 No I have tooth or mouth problems that make it hard for me to eat 0 No I don't always have enough money to buy the food I need 0 No I eat alone most of the time 0 No I take three or more different prescribed or over-the-counter drugs a day 1 Yes Without wanting to, I have lost or gained 10 pounds in the last six months 0 No I am not always physically able to shop, cook and/or feed myself 2 Yes Nutrition Protocols Good Risk Protocol Moderate Risk Protocol 0 Provide education on nutrition High Risk Proctocol Risk Level: Moderate Risk Score: 5 Electronic Signature(s) Signed: 10/13/2021 5:45:41 PM By: Stanley Hurst RN, BSN Entered By:  [name] on 10/13/2021 09:40:41

## 2021-10-13 NOTE — Progress Notes (Signed)
[name], [name] ([id number]) Visit Report for 10/13/2021 Allergy List Details Patient Name: Date of Service: [name] NA TO LIY S. 10/13/2021 9:00 A M Medical Record Number: [id number] Patient Account Number: [id number] Date of Birth/Sex: Treating RN: 1946-06-07 (75 y.o. Male) Levan Hurst Primary Care Gurman Ashland: RA DIO Ellen Henri Other Clinician: Referring Domnic Vantol: Treating [place] Deleo/Extender: Linton Ham RA DIO Laurena Bering, A LEXEI Weeks in Treatment: 0 Allergies Active Allergies No Known Drug Allergies Allergy Notes Electronic Signature(s) Signed: 10/13/2021 5:45:41 PM By: Levan Hurst RN, BSN Entered By: Levan Hurst on 10/13/2021 09:37:10 -------------------------------------------------------------------------------- Arrival Information Details Patient Name: Date of Service: [name], A NA TO Tonye Royalty S. 10/13/2021 9:00 A M Medical Record Number: [id number] Patient Account Number: [id number] Date of Birth/Sex: Treating RN: [birth year] (75 y.o. Male) Levan Hurst Primary Care Tabari Volkert: RA DIO Ellen Henri Other Clinician: Referring Maecy Podgurski: Treating Lazarius Rivkin/Extender: Linton Ham RA DIO Ellen Henri Weeks in Treatment: 0 Visit Information Patient Arrived: Cane Arrival Time: 09:07 Accompanied By: interpreter Transfer Assistance: Manual Patient Identification Verified: Yes Secondary Verification Process Completed: Yes Patient Requires Transmission-Based Precautions: No Patient Has Alerts: Yes Patient Alerts: Patient on Blood Thinner Electronic Signature(s) Signed: 10/13/2021 5:45:41 PM By: Levan Hurst RN, BSN Entered By: Levan Hurst on 10/13/2021 09:08:50 -------------------------------------------------------------------------------- Clinic Level of Care Assessment Details Patient Name: Date of Service: Stanley [name], A NA TO [place] S. 10/13/2021 9:00 A M Medical Record Number: [id number] Patient Account Number: [id number] Date of Birth/Sex:  Treating RN: August 14, 1946 (75 y.o. Male) Levan Hurst Primary Care Damiah Mcdonald: RA DIO Ellen Henri Other Clinician: Referring Ichael Pullara: Treating Elieser Tetrick/Extender: Linton Ham RA DIO Laurena Bering, A LEXEI Weeks in Treatment: 0 Clinic Level of Care Assessment Items TOOL 1 Quantity Score X- 1 0 Use when EandM and Procedure is performed on INITIAL visit ASSESSMENTS - Nursing Assessment / Reassessment X- 1 20 General Physical Exam (combine w/ comprehensive assessment (listed just below) when performed on new pt. evals) X- 1 25 Comprehensive Assessment (HX, ROS, Risk Assessments, Wounds Hx, etc.) ASSESSMENTS - Wound and Skin Assessment / Reassessment []  - 0 Dermatologic / Skin Assessment (not related to wound area) ASSESSMENTS - Ostomy and/or Continence Assessment and Care []  - 0 Incontinence Assessment and Management []  - 0 Ostomy Care Assessment and Management (repouching, etc.) PROCESS - Coordination of Care X - Simple Patient / Family Education for ongoing care 1 15 []  - 0 Complex (extensive) Patient / Family Education for ongoing care X- 1 10 Staff obtains Programmer, systems, Records, T Results / Process Orders est []  - 0 Staff telephones HHA, Nursing Homes / Clarify orders / etc []  - 0 Routine Transfer to another Facility (non-emergent condition) []  - 0 Routine Hospital Admission (non-emergent condition) X- 1 15 New Admissions / Biomedical engineer / Ordering NPWT Apligraf, etc. , []  - 0 Emergency Hospital Admission (emergent condition) PROCESS - Special Needs []  - 0 Pediatric / Minor Patient Management []  - 0 Isolation Patient Management []  - 0 Hearing / Language / Visual special needs []  - 0 Assessment of Community assistance (transportation, D/C planning, etc.) []  - 0 Additional assistance / Altered mentation []  - 0 Support Surface(s) Assessment (bed, cushion, seat, etc.) INTERVENTIONS - Miscellaneous []  - 0 External ear exam []  - 0 Patient Transfer  (multiple staff / Civil Service fast streamer / Similar devices) []  - 0 Simple Staple / Suture removal (25 or less) []  - 0 Complex Staple / Suture removal (26 or more) []  - 0 Hypo/Hyperglycemic Management (do not check if billed separately) X-  1 15 Ankle / Brachial Index (ABI) - do not check if billed separately Has the patient been seen at the hospital within the last three years: Yes Total Score: 100 Level Of Care: New/Established - Level 3 Electronic Signature(s) Signed: 10/13/2021 5:45:41 PM By: Levan Hurst RN, BSN Entered By: Levan Hurst on 10/13/2021 16:04:19 -------------------------------------------------------------------------------- Compression Therapy Details Patient Name: Date of Service: [name] NA TO Tonye Royalty S. 10/13/2021 9:00 A M Medical Record Number: [id number] Patient Account Number: [id number] Date of Birth/Sex: Treating RN: 12/19/1945 (75 y.o. Male) Levan Hurst Primary Care Real Cona: RA DIO Ellen Henri Other Clinician: Referring Auron Tadros: Treating Jeromie Gainor/Extender: Linton Ham RA DIO Laurena Bering, A LEXEI Weeks in Treatment: 0 Compression Therapy Performed for Wound Assessment: Wound #1 Right,Anterior Lower Leg Performed By: Clinician Levan Hurst, RN Compression Type: Double Layer Post Procedure Diagnosis Same as Pre-procedure Electronic Signature(s) Signed: 10/13/2021 5:45:41 PM By: Levan Hurst RN, BSN Entered By: Levan Hurst on 10/13/2021 10:16:18 -------------------------------------------------------------------------------- Compression Therapy Details Patient Name: Date of Service: [name] NA TO Tonye Royalty S. 10/13/2021 9:00 A M Medical Record Number: [id number] Patient Account Number: [id number] Date of Birth/Sex: Treating RN: Dec 27, 1945 (75 y.o. Male) Levan Hurst Primary Care Deundra Bard: RA DIO Ellen Henri Other Clinician: Referring Fynn Adel: Treating Marland Reine/Extender: Linton Ham RA DIO Laurena Bering, A LEXEI Weeks in Treatment:  0 Compression Therapy Performed for Wound Assessment: Wound #2 [place] Lower Leg Performed By: Clinician Levan Hurst, RN Compression Type: Double Layer Post Procedure Diagnosis Same as Pre-procedure Electronic Signature(s) Signed: 10/13/2021 5:45:41 PM By: Levan Hurst RN, BSN Entered By: Levan Hurst on 10/13/2021 10:16:18 -------------------------------------------------------------------------------- Lower Extremity Assessment Details Patient Name: Date of Service: Stanley Bari Edward, A NA TO [place] S. 10/13/2021 9:00 A M Medical Record Number: [id number] Patient Account Number: [id number] Date of Birth/Sex: Treating RN: 02-16-1946 (75 y.o. Male) Levan Hurst Primary Care Jeanette Moffatt: RA DIO Ellen Henri Other Clinician: Referring Markiyah Gahm: Treating Larry Knipp/Extender: Linton Ham RA DIO Laurena Bering, A LEXEI Weeks in Treatment: 0 Edema Assessment Assessed: [Left: No] [Right: No] Edema: [Left: Yes] [Right: Yes] Calf Left: Right: Point of Measurement: 30 cm From Medial Instep 43 cm 44.5 cm Ankle Left: Right: Point of Measurement: 11 cm From Medial Instep 29.5 cm 30 cm Vascular Assessment Pulses: Dorsalis Pedis Palpable: [Left:Yes] [Right:Yes] Blood Pressure: Brachial: [Left:146] [Right:146] Ankle: [Left:Dorsalis Pedis: 152 1.04] [Right:Dorsalis Pedis: 156 1.07] Electronic Signature(s) Signed: 10/13/2021 5:45:41 PM By: Levan Hurst RN, BSN Entered By: Levan Hurst on 10/13/2021 09:47:21 -------------------------------------------------------------------------------- Multi Wound Chart Details Patient Name: Date of Service: [name], A NA TO Tonye Royalty S. 10/13/2021 9:00 A M Medical Record Number: [id number] Patient Account Number: [id number] Date of Birth/Sex: Treating RN: 10/03/1946 (75 y.o. Male) Rhae Hammock Primary Care Arriel Victor: RA DIO Ellen Henri Other Clinician: Referring Ahmir Bracken: Treating Seyed Heffley/Extender: Linton Ham RA DIO Laurena Bering, A  LEXEI Weeks in Treatment: 0 Vital Signs Height(in): Pulse(bpm): 53 Weight(lbs): Blood Pressure(mmHg): 146/64 Body Mass Index(BMI): Temperature(F): 98.4 Respiratory Rate(breaths/min): 16 Photos: Right, Anterior Lower Leg Left, Posterior Lower Leg Right Groin Wound Location: Gradually Appeared Gradually Appeared Gradually Appeared Wounding Event: Venous Leg Ulcer Venous Leg Ulcer Atypical Primary Etiology: 07/16/2021 09/28/2021 07/16/2021 Date Acquired: 0 0 0 Weeks of Treatment: Open Open Open Wound Status: No No Yes Clustered Wound: N/A N/A 3 Clustered Quantity: 1.2x1x0.1 2.1x1.5x0.1 1x4x0.2 Measurements L x W x D (cm) 0.942 2.474 3.142 A (cm) : rea 0.094 0.247 0.628 Volume (cm) : 0.00% 0.00% 0.00% % Reduction in A rea: 0.00% 0.00% 0.00% % Reduction in Volume: Full  Thickness Without Exposed Unclassifiable Full Thickness Without Exposed Classification: Support Structures Support Structures Medium Medium Medium Exudate Amount: Serous Serosanguineous Serosanguineous Exudate Type: amber red, brown red, brown Exudate Color: Flat and Intact Flat and Intact Flat and Intact Wound Margin: Small (1-33%) None Present (0%) Small (1-33%) Granulation Amount: Pink N/A Pink Granulation Quality: Large (67-100%) Large (67-100%) Large (67-100%) Necrotic Amount: Adherent [place] Necrotic Tissue: Fat Layer (Subcutaneous Tissue): Yes Fascia: No Fat Layer (Subcutaneous Tissue): Yes Exposed Structures: Fascia: No Fat Layer (Subcutaneous Tissue): No Fascia: No Tendon: No Tendon: No Tendon: No Muscle: No Muscle: No Muscle: No Joint: No Joint: No Joint: No Bone: No Bone: No Bone: No None None None Epithelialization: Debridement - Excisional Debridement - Excisional Chemical/Enzymatic/Mechanical Debridement: Pre-procedure Verification/Time Out 10:03 10:03 10:15 Taken: Subcutaneous, Slough Necrotic/Eschar, Subcutaneous,  N/A Tissue Debrided: Slough Skin/Subcutaneous Tissue Skin/Subcutaneous Tissue N/A Level: 1.2 3.15 N/A Debridement A (sq cm): rea Curette Curette N/A Instrument: None Swab N/A Specimen: N/A 1 N/A Number of Specimens Taken: Minimum Minimum None Bleeding: Pressure Pressure N/A Hemostasis Achieved: 0 0 0 Procedural Pain: 0 0 0 Post Procedural Pain: Procedure was tolerated well Procedure was tolerated well Procedure was tolerated well Debridement Treatment Response: 1.2x1x0.1 2.1x1.5x0.1 1x4x0.2 Post Debridement Measurements L x W x D (cm) 0.094 0.247 0.628 Post Debridement Volume: (cm) Compression Therapy Compression Therapy Debridement Procedures Performed: Debridement Debridement Treatment Notes Electronic Signature(s) Signed: 10/13/2021 5:06:47 PM By: Linton Ham MD Signed: 10/13/2021 5:11:56 PM By: Rhae Hammock RN Entered By: Linton Ham on 10/13/2021 12:44:52 -------------------------------------------------------------------------------- Multi-Disciplinary Care Plan Details Patient Name: Date of Service: [name], A NA TO [name]. 10/13/2021 9:00 A M Medical Record Number: [id number] Patient Account Number: [id number] Date of Birth/Sex: Treating RN: 12-15-1945 (75 y.o. Male) Levan Hurst Primary Care Rollo Farquhar: RA DIO Ellen Henri Other Clinician: Referring Shahzad Thomann: Treating [place] Capers/Extender: Linton Ham RA DIO [place] Calix in Treatment: 0 Multidisciplinary Care Plan reviewed with physician Active Inactive Abuse / Safety / Falls / Self Care Management Nursing Diagnoses: History of Falls Potential for falls Potential for injury related to falls Goals: Patient will not experience any injury related to falls Date Initiated: 10/13/2021 Target Resolution Date: 11/13/2021 Goal Status: Active Patient/caregiver will verbalize/demonstrate measures taken to prevent injury and/or falls Date Initiated: 10/13/2021 Target Resolution  Date: 11/13/2021 Goal Status: Active Interventions: Assess Activities of Daily Living upon admission and as needed Assess fall risk on admission and as needed Assess: immobility, friction, shearing, incontinence upon admission and as needed Assess impairment of mobility on admission and as needed per policy Assess personal safety and home safety (as indicated) on admission and as needed Assess self care needs on admission and as needed Provide education on fall prevention Provide education on personal and home safety Notes: Nutrition Nursing Diagnoses: Impaired glucose control: actual or potential Potential for alteratiion in Nutrition/Potential for imbalanced nutrition Goals: Patient/caregiver agrees to and verbalizes understanding of need to use nutritional supplements and/or vitamins as prescribed Date Initiated: 10/13/2021 Target Resolution Date: 11/13/2021 Goal Status: Active Patient/caregiver will maintain therapeutic glucose control Date Initiated: 10/13/2021 Target Resolution Date: 11/13/2021 Goal Status: Active Interventions: Assess HgA1c results as ordered upon admission and as needed Assess patient nutrition upon admission and as needed per policy Provide education on elevated blood sugars and impact on wound healing Provide education on nutrition Notes: Wound/Skin Impairment Nursing Diagnoses: Impaired tissue integrity Knowledge deficit related to ulceration/compromised skin integrity Goals: Patient/caregiver will verbalize understanding of skin care regimen Date Initiated:  10/13/2021 Target Resolution Date: 11/13/2021 Goal Status: Active Interventions: Assess patient/caregiver ability to obtain necessary supplies Assess patient/caregiver ability to perform ulcer/skin care regimen upon admission and as needed Assess ulceration(s) every visit Provide education on ulcer and skin care Notes: Electronic Signature(s) Signed: 10/13/2021 5:45:41 PM By: Levan Hurst RN, BSN Entered By: Levan Hurst on 10/13/2021 16:02:59 -------------------------------------------------------------------------------- Pain Assessment Details Patient Name: Date of Service: [name] NA TO Tonye Royalty S. 10/13/2021 9:00 A M Medical Record Number: [id number] Patient Account Number: [id number] Date of Birth/Sex: Treating RN: 09-23-1946 (75 y.o. Male) Levan Hurst Primary Care Hilding Quintanar: RA DIO Ellen Henri Other Clinician: Referring Theon Sobotka: Treating Laderrick Wilk/Extender: Linton Ham RA DIO Laurena Bering, A LEXEI Weeks in Treatment: 0 Active Problems Location of Pain Severity and Description of Pain Patient Has Paino No Site Locations Pain Management and Medication Current Pain Management: Electronic Signature(s) Signed: 10/13/2021 5:45:41 PM By: Levan Hurst RN, BSN Entered By: Levan Hurst on 10/13/2021 09:49:58 -------------------------------------------------------------------------------- Patient/Caregiver Education Details Patient Name: Date of Service: [name] NA TO [name] 11/29/2022andnbsp9:00 A M Medical Record Number: [id number] Patient Account Number: [id number] Date of Birth/Gender: Treating RN: 03-Aug-1946 (75 y.o. Male) Levan Hurst Primary Care Physician: RA DIO Ellen Henri Other Clinician: Referring Physician: Treating Physician/Extender: Linton Ham RA DIO [place] Calix in Treatment: 0 Education Assessment Education Provided To: Patient Education Topics Provided Elevated Blood Sugar/ Impact on Healing: Methods: Explain/Verbal Responses: State content correctly Nutrition: Methods: Explain/Verbal Responses: State content correctly Safety: Methods: Explain/Verbal Responses: State content correctly Wound/Skin Impairment: Methods: Explain/Verbal Responses: State content correctly Electronic Signature(s) Signed: 10/13/2021 5:45:41 PM By: Levan Hurst RN, BSN Entered By: Levan Hurst on 10/13/2021  16:03:15 -------------------------------------------------------------------------------- Wound Assessment Details Patient Name: Date of Service: [name], A NA TO Tonye Royalty S. 10/13/2021 9:00 A M Medical Record Number: [id number] Patient Account Number: [id number] Date of Birth/Sex: Treating RN: 25-Sep-1946 (75 y.o. Male) Levan Hurst Primary Care Robert Sunga: RA DIO Ellen Henri Other Clinician: Referring Masiah Lewing: Treating Yandriel Boening/Extender: Linton Ham RA DIO Laurena Bering, A LEXEI Weeks in Treatment: 0 Wound Status Wound Number: 1 Primary Etiology: Venous Leg Ulcer Wound Location: Right, Anterior Lower Leg Wound Status: Open Wounding Event: Gradually Appeared Date Acquired: 07/16/2021 Weeks Of Treatment: 0 Clustered Wound: No Photos Wound Measurements Length: (cm) 1.2 Width: (cm) 1 Depth: (cm) 0.1 Area: (cm) 0.942 Volume: (cm) 0.094 % Reduction in Area: 0% % Reduction in Volume: 0% Epithelialization: None Tunneling: No Undermining: No Wound Description Classification: Full Thickness Without Exposed Support Structures Wound Margin: Flat and Intact Exudate Amount: Medium Exudate Type: Serous Exudate Color: amber Foul Odor After Cleansing: No Slough/Fibrino Yes Wound Bed Granulation Amount: Small (1-33%) Exposed Structure Granulation Quality: Pink Fascia Exposed: No Necrotic Amount: Large (67-100%) Fat Layer (Subcutaneous Tissue) Exposed: Yes Necrotic Quality: Adherent Slough Tendon Exposed: No Muscle Exposed: No Joint Exposed: No Bone Exposed: No Electronic Signature(s) Signed: 10/13/2021 5:45:41 PM By: Levan Hurst RN, BSN Entered By: Levan Hurst on 10/13/2021 09:32:13 -------------------------------------------------------------------------------- Wound Assessment Details Patient Name: Date of Service: [name], A NA TO Tonye Royalty S. 10/13/2021 9:00 A M Medical Record Number: [id number] Patient Account Number: [id number] Date of Birth/Sex: Treating  RN: 1946/11/05 (75 y.o. Male) Levan Hurst Primary Care Deryn Massengale: RA DIO Ellen Henri Other Clinician: Referring Lamere Lightner: Treating Blondie Riggsbee/Extender: Linton Ham RA DIO Laurena Bering, A LEXEI Weeks in Treatment: 0 Wound Status Wound Number: 2 Primary Etiology: Venous Leg Ulcer Wound Location: Left, Posterior Lower Leg Wound Status: Open Wounding Event: Gradually Appeared Date Acquired: 09/28/2021  Weeks Of Treatment: 0 Clustered Wound: No Photos Wound Measurements Length: (cm) 2.1 Width: (cm) 1.5 Depth: (cm) 0.1 Area: (cm) 2.474 Volume: (cm) 0.247 % Reduction in Area: 0% % Reduction in Volume: 0% Epithelialization: None Tunneling: No Undermining: No Wound Description Classification: Unclassifiable Wound Margin: Flat and Intact Exudate Amount: Medium Exudate Type: Serosanguineous Exudate Color: red, brown Foul Odor After Cleansing: No Slough/Fibrino Yes Wound Bed Granulation Amount: None Present (0%) Exposed Structure Necrotic Amount: Large (67-100%) Fascia Exposed: No Necrotic Quality: Eschar, Adherent Slough Fat Layer (Subcutaneous Tissue) Exposed: No Tendon Exposed: No Muscle Exposed: No Joint Exposed: No Bone Exposed: No Electronic Signature(s) Signed: 10/13/2021 5:45:41 PM By: Levan Hurst RN, BSN Entered By: Levan Hurst on 10/13/2021 09:32:56 -------------------------------------------------------------------------------- Wound Assessment Details Patient Name: Date of Service: [name], A NA TO Tonye Royalty S. 10/13/2021 9:00 A M Medical Record Number: [id number] Patient Account Number: [id number] Date of Birth/Sex: Treating RN: [birth year] (75 y.o. Male) Levan Hurst Primary Care Levester Waldridge: Other Clinician: RA DIO Ellen Henri Referring Bessie Livingood: Treating Jasmyne Lodato/Extender: Linton Ham RA DIO Laurena Bering, A LEXEI Weeks in Treatment: 0 Wound Status Wound Number: 3 Primary Etiology: Atypical Wound Location: Right Groin Wound Status:  Open Wounding Event: Gradually Appeared Date Acquired: 07/16/2021 Weeks Of Treatment: 0 Clustered Wound: Yes Photos Wound Measurements Length: (cm) 1 Width: (cm) 4 Depth: (cm) 0.2 Clustered Quantity: 3 Area: (cm) 3.142 Volume: (cm) 0.628 % Reduction in Area: 0% % Reduction in Volume: 0% Epithelialization: None Tunneling: No Undermining: No Wound Description Classification: Full Thickness Without Exposed Support Stru Wound Margin: Flat and Intact Exudate Amount: Medium Exudate Type: Serosanguineous Exudate Color: red, brown ctures Foul Odor After Cleansing: No Slough/Fibrino Yes Wound Bed Granulation Amount: Small (1-33%) Exposed Structure Granulation Quality: Pink Fascia Exposed: No Necrotic Amount: Large (67-100%) Fat Layer (Subcutaneous Tissue) Exposed: Yes Necrotic Quality: Adherent Slough Tendon Exposed: No Muscle Exposed: No Joint Exposed: No Bone Exposed: No Electronic Signature(s) Signed: 10/13/2021 5:45:41 PM By: Levan Hurst RN, BSN Entered By: Levan Hurst on 10/13/2021 09:33:24 -------------------------------------------------------------------------------- Vitals Details Patient Name: Date of Service: Stanley Bari Edward, A NA TO Tonye Royalty S. 10/13/2021 9:00 A M Medical Record Number: [id number] Patient Account Number: [id number] Date of Birth/Sex: Treating RN: 03/07/1946 (75 y.o. Male) Levan Hurst Primary Care Stacye Noori: RA DIO Ellen Henri Other Clinician: Referring Elica Almas: Treating Paulett Kaufhold/Extender: Linton Ham RA DIO Laurena Bering, A LEXEI Weeks in Treatment: 0 Vital Signs Time Taken: 09:10 Temperature (F): 98.4 Pulse (bpm): 82 Respiratory Rate (breaths/min): 16 Blood Pressure (mmHg): 146/64 Reference Range: 80 - 120 mg / dl Electronic Signature(s) Signed: 10/13/2021 5:45:41 PM By: Levan Hurst RN, BSN Entered By: Levan Hurst on 10/13/2021 09:11:30

## 2021-10-14 ENCOUNTER — Other Ambulatory Visit: Payer: Medicare HMO

## 2021-10-16 ENCOUNTER — Other Ambulatory Visit: Payer: Self-pay | Admitting: Hematology and Oncology

## 2021-10-18 LAB — AEROBIC/ANAEROBIC CULTURE W GRAM STAIN (SURGICAL/DEEP WOUND): Gram Stain: NONE SEEN

## 2021-10-20 ENCOUNTER — Encounter (HOSPITAL_BASED_OUTPATIENT_CLINIC_OR_DEPARTMENT_OTHER): Payer: Medicare HMO | Attending: Internal Medicine | Admitting: Internal Medicine

## 2021-10-20 ENCOUNTER — Other Ambulatory Visit: Payer: Self-pay

## 2021-10-20 DIAGNOSIS — Z7901 Long term (current) use of anticoagulants: Secondary | ICD-10-CM | POA: Insufficient documentation

## 2021-10-20 DIAGNOSIS — T148XXA Other injury of unspecified body region, initial encounter: Secondary | ICD-10-CM

## 2021-10-20 DIAGNOSIS — L97828 Non-pressure chronic ulcer of other part of left lower leg with other specified severity: Secondary | ICD-10-CM | POA: Insufficient documentation

## 2021-10-20 DIAGNOSIS — Z79899 Other long term (current) drug therapy: Secondary | ICD-10-CM | POA: Diagnosis not present

## 2021-10-20 DIAGNOSIS — L97818 Non-pressure chronic ulcer of other part of right lower leg with other specified severity: Secondary | ICD-10-CM | POA: Diagnosis not present

## 2021-10-20 DIAGNOSIS — I4891 Unspecified atrial fibrillation: Secondary | ICD-10-CM | POA: Diagnosis not present

## 2021-10-20 DIAGNOSIS — E119 Type 2 diabetes mellitus without complications: Secondary | ICD-10-CM | POA: Diagnosis not present

## 2021-10-20 DIAGNOSIS — S31103A Unspecified open wound of abdominal wall, right lower quadrant without penetration into peritoneal cavity, initial encounter: Secondary | ICD-10-CM | POA: Insufficient documentation

## 2021-10-20 DIAGNOSIS — I2510 Atherosclerotic heart disease of native coronary artery without angina pectoris: Secondary | ICD-10-CM | POA: Insufficient documentation

## 2021-10-20 DIAGNOSIS — Z9221 Personal history of antineoplastic chemotherapy: Secondary | ICD-10-CM | POA: Diagnosis not present

## 2021-10-20 DIAGNOSIS — X58XXXA Exposure to other specified factors, initial encounter: Secondary | ICD-10-CM | POA: Insufficient documentation

## 2021-10-20 DIAGNOSIS — I87313 Chronic venous hypertension (idiopathic) with ulcer of bilateral lower extremity: Secondary | ICD-10-CM | POA: Diagnosis not present

## 2021-10-20 DIAGNOSIS — C8330 Diffuse large B-cell lymphoma, unspecified site: Secondary | ICD-10-CM | POA: Diagnosis not present

## 2021-10-20 DIAGNOSIS — Z7902 Long term (current) use of antithrombotics/antiplatelets: Secondary | ICD-10-CM | POA: Insufficient documentation

## 2021-10-20 HISTORY — DX: Other injury of unspecified body region, initial encounter: T14.8XXA

## 2021-10-20 NOTE — Progress Notes (Signed)
[name], [name] ([id number]) Visit Report for 10/20/2021 Arrival Information Details Patient Name: Date of Service: [name] NA TO [place] S. 10/20/2021 8:00 Stanley M Medical Record Number: [id number] Patient Account Number: [id number] Date of Birth/Sex: Treating RN: 19-Aug-1946 (75 y.o. [name] Primary Care Jeshua Ransford: [name] Other Clinician: Referring Aron Needles: Treating Tammera Engert/Extender: Edgardo Roys in Treatment: 1 Visit Information History Since Last Visit Added or deleted any medications: No Patient Arrived: Ambulatory Any new allergies or adverse reactions: No Arrival Time: 07:55 Had Stanley fall or experienced change in No Accompanied By: wife activities of daily living that may affect Transfer Assistance: None risk of falls: Patient Identification Verified: Yes Signs or symptoms of abuse/neglect since last visito No Secondary Verification Process Completed: Yes Hospitalized since last visit: No Patient Requires Transmission-Based Precautions: No Implantable device outside of the clinic excluding No Patient Has Alerts: Yes cellular tissue based products placed in the center Patient Alerts: Patient on Blood Thinner since last visit: Has Dressing in Place as Prescribed: Yes Has Compression in Place as Prescribed: Yes Pain Present Now: No Electronic Signature(s) Signed: 10/20/2021 4:57:05 PM By: Levan Hurst RN, BSN Entered By: Levan Hurst on 10/20/2021 12:00:00 -------------------------------------------------------------------------------- Compression Therapy Details Patient Name: Date of Service: [name], Stanley NA TO [place] Royal[place] S. 10/20/2021 8:00 Stanley M Medical Record Number: [id number] Patient Account Number: [id number] Date of Birth/Sex: Treating RN: [birth year] (75 y.o. [name] Primary Care Rigel Filsinger: [name] Other Clinician: Referring Liberty Stead: Treating Derral Colucci/Extender: Edgardo Roys in Treatment: 1 Compression Therapy Performed for Wound Assessment: Wound #1 Right,Anterior Lower Leg Performed By: Clinician Levan Hurst, RN Compression Type: Double Layer Post Procedure Diagnosis Same as Pre-procedure Electronic Signature(s) Signed: 10/20/2021 4:57:05 PM By: Levan Hurst RN, BSN Entered By: Levan Hurst on 10/20/2021 08:59:20 -------------------------------------------------------------------------------- Compression Therapy Details Patient Name: Date of Service: [name], Stanley NA TO [place]. 10/20/2021 8:00 Stanley M Medical Record Number: [id number] Patient Account Number: [id number] Date of Birth/Sex: Treating RN: [birth year] (75 y.o. [name] Primary Care Carlena Ruybal: [name] Other Clinician: Referring Asante Blanda: Treating Kaysee Hergert/Extender: Edgardo Roys in Treatment: 1 Compression Therapy Performed for Wound Assessment: Wound #2 Left,Posterior Lower Leg Performed By: Clinician Levan Hurst, RN Compression Type: Double Layer Post Procedure Diagnosis Same as Pre-procedure Electronic Signature(s) Signed: 10/20/2021 4:57:05 PM By: Levan Hurst RN, BSN Entered By: Levan Hurst on 10/20/2021 08:59:20 -------------------------------------------------------------------------------- Compression Therapy Details Patient Name: Date of Service: [name], Stanley NA TO [place]. 10/20/2021 8:00 Stanley M Medical Record Number: [id number] Patient Account Number: [id number] Date of Birth/Sex: Treating RN: June 11, 1946 (76 y.o. [name] Primary Care Yetzali Weld: [name] Other Clinician: Referring Kortlyn Koltz: Treating Haskell Rihn/Extender: Edgardo Roys in Treatment: 1 Compression Therapy Performed for Wound Assessment: Wound #5 Left,Proximal,Medial Lower Leg Performed By: Clinician Levan Hurst, RN Compression Type: Double Layer Post Procedure Diagnosis Same as  Pre-procedure Electronic Signature(s) Signed: 10/20/2021 4:57:05 PM By: Levan Hurst RN, BSN Entered By: Levan Hurst on 10/20/2021 08:59:20 -------------------------------------------------------------------------------- Encounter Discharge Information Details Patient Name: Date of Service: [name], Stanley NA TO [place] S. 10/20/2021 8:00 Stanley M Medical Record Number: [id number] Patient Account Number: [id number] Date of Birth/Sex: Treating RN: [birth year] (75 y.o. [name] Primary Care Ronae Noell: [name] Other Clinician: Referring Gisell Buehrle: Treating Glenn Gullickson/Extender: Edgardo Roys in Treatment: 1 Encounter Discharge Information Items Discharge Condition: Stable Ambulatory Status: Ambulatory Discharge Destination: Home Transportation: Private Auto Accompanied By: wife Schedule Follow-up Appointment: Yes  Clinical Summary of Care: Patient Declined Electronic Signature(s) Signed: 10/20/2021 4:57:05 PM By: Levan Hurst RN, BSN Entered By: Levan Hurst on 10/20/2021 11:59:53 -------------------------------------------------------------------------------- Lower Extremity Assessment Details Patient Name: Date of Service: [name], Stanley NA TO [place] S. 10/20/2021 8:00 Stanley M Medical Record Number: [id number] Patient Account Number: [id number] Date of Birth/Sex: Treating RN: [birth year] (75 y.o. [name] Primary Care Kensly Bowmer: [name] Other Clinician: Referring Malikai Gut: Treating Emir Nack/Extender: Edgardo Roys in Treatment: 1 Edema Assessment Assessed: [Left: No] [Right: No] Edema: [Left: Yes] [Right: Yes] Calf Left: Right: Point of Measurement: 30 cm From Medial Instep 38.5 cm 38.5 cm Ankle Left: Right: Point of Measurement: 11 cm From Medial Instep 28 cm 28.5 cm Vascular Assessment Pulses: Dorsalis Pedis Palpable: [Left:Yes] [Right:Yes] Electronic Signature(s) Signed: 10/20/2021 4:57:05  PM By: Levan Hurst RN, BSN Entered By: Levan Hurst on 10/20/2021 08:15:21 -------------------------------------------------------------------------------- Multi Wound Chart Details Patient Name: Date of Service: [name], Stanley NA TO [place] Royalty S. 10/20/2021 8:00 Stanley M Medical Record Number: [id number] Patient Account Number: [id number] Date of Birth/Sex: Treating RN: November 24, 1945 (75 y.o. M) Primary Care Amberly Livas: [name] Other Clinician: Referring Misao Fackrell: Treating Beautiful Pensyl/Extender: Edgardo Roys in Treatment: 1 Vital Signs Height(in): Pulse(bpm): 54 Weight(lbs): Blood Pressure(mmHg): 131/89 Body Mass Index(BMI): Temperature(F): 98.3 Respiratory Rate(breaths/min): 18 Photos: Right, Anterior Lower Leg Left, Posterior Lower Leg Right Groin Wound Location: Gradually Appeared Gradually Appeared Gradually Appeared Wounding Event: Venous Leg Ulcer Venous Leg Ulcer Atypical Primary Etiology: Sleep Apnea, Arrhythmia, Coronary Sleep Apnea, Arrhythmia, Coronary Sleep Apnea, Arrhythmia, Coronary Comorbid History: Artery Disease, Hypertension, Type II Artery Disease, Hypertension, Type II Artery Disease, Hypertension, Type II Diabetes, Received Chemotherapy Diabetes, Received Chemotherapy Diabetes, Received Chemotherapy 07/16/2021 09/28/2021 07/16/2021 Date Acquired: '1 1 1 ' Weeks of Treatment: Open Open Open Wound Status: No Yes Yes Clustered Wound: N/Stanley 4 3 Clustered Quantity: 1.2x0.9x0.2 4x2.3x0.1 1.1x4.5x0.1 Measurements L x W x D (cm) 0.848 7.226 3.888 Stanley (cm) : rea 0.17 0.723 0.389 Volume (cm) : 10.00% -192.10% -23.70% % Reduction in Area: -80.90% -192.70% 38.10% % Reduction in Volume: Full Thickness Without Exposed Full Thickness Without Exposed Full Thickness Without Exposed Classification: Support Structures Support Structures Support Structures Medium Medium Medium Exudate Amount: Serosanguineous Serosanguineous  Serosanguineous Exudate Type: red, brown red, brown red, brown Exudate Color: Flat and Intact Flat and Intact Flat and Intact Wound Margin: Large (67-100%) Small (1-33%) Small (1-33%) Granulation Amount: Red, Pink Pink Pink Granulation Quality: Small (1-33%) Large (67-100%) Large (67-100%) Necrotic Amount: Fat Layer (Subcutaneous Tissue): Yes Fat Layer (Subcutaneous Tissue): Yes Fat Layer (Subcutaneous Tissue): Yes Exposed Structures: Fascia: No Fascia: No Fascia: No Tendon: No Tendon: No Tendon: No Muscle: No Muscle: No Muscle: No Joint: No Joint: No Joint: No Bone: No Bone: No Bone: No None None None Epithelialization: Wound Number: 4 5 N/Stanley Photos: N/Stanley Right, Lateral Abdomen - Lower Left, Proximal, Medial Lower Leg N/Stanley Wound Location: Quadrant Gradually Appeared Blister N/Stanley Wounding Event: Atypical Venous Leg Ulcer N/Stanley Primary Etiology: Sleep Apnea, Arrhythmia, Coronary Sleep Apnea, Arrhythmia, Coronary N/Stanley Comorbid History: Artery Disease, Hypertension, Type II Artery Disease, Hypertension, Type II Diabetes, Received Chemotherapy Diabetes, Received Chemotherapy 10/20/2021 10/20/2021 N/Stanley Date Acquired: 0 0 N/Stanley Weeks of Treatment: Open Open N/Stanley Wound Status: No No N/Stanley Clustered Wound: N/Stanley N/Stanley N/Stanley Clustered Quantity: 0.6x3.4x0.1 1x0.8x0.1 N/Stanley Measurements L x W x D (cm) 1.602 0.628 N/Stanley Stanley (cm) : rea 0.16 0.063 N/Stanley Volume (cm) : 0.00% 0.00% N/Stanley % Reduction in Area: 0.00% 0.00% N/Stanley %  Reduction in Volume: Full Thickness Without Exposed Full Thickness Without Exposed N/Stanley Classification: Support Structures Support Structures Medium Medium N/Stanley Exudate Amount: Serosanguineous Purulent N/Stanley Exudate Type: red, brown yellow, brown, green N/Stanley Exudate Color: Flat and Intact Flat and Intact N/Stanley Wound Margin: Medium (34-66%) Large (67-100%) N/Stanley Granulation Amount: Pink Red N/Stanley Granulation Quality: Medium (34-66%) Small (1-33%) N/Stanley Necrotic Amount: Fat  Layer (Subcutaneous Tissue): Yes Fat Layer (Subcutaneous Tissue): Yes N/Stanley Exposed Structures: Fascia: No Fascia: No Tendon: No Tendon: No Muscle: No Muscle: No Joint: No Joint: No Bone: No Bone: No None None N/Stanley Epithelialization: Treatment Notes Electronic Signature(s) Signed: 10/20/2021 4:21:48 PM By: Linton Ham MD Entered By: Linton Ham on 10/20/2021 08:48:44 -------------------------------------------------------------------------------- Multi-Disciplinary Care Plan Details Patient Name: Date of Service: [name], [name]. 10/20/2021 8:00 Stanley M Medical Record Number: [id number] Patient Account Number: [id number] Date of Birth/Sex: Treating RN: Feb 10, 1946 (75 y.o. [name] Primary Care Donique Hammonds: [name] Other Clinician: Referring Kambry Takacs: Treating Camrie Stock/Extender: Edgardo Roys in Treatment: 1 Multidisciplinary Care Plan reviewed with physician Active Inactive Abuse / Safety / Falls / Self Care Management Nursing Diagnoses: History of Falls Potential for falls Potential for injury related to falls Goals: Patient will not experience any injury related to falls Date Initiated: 10/13/2021 Target Resolution Date: 11/13/2021 Goal Status: Active Patient/caregiver will verbalize/demonstrate measures taken to prevent injury and/or falls Date Initiated: 10/13/2021 Target Resolution Date: 11/13/2021 Goal Status: Active Interventions: Assess Activities of Daily Living upon admission and as needed Assess fall risk on admission and as needed Assess: immobility, friction, shearing, incontinence upon admission and as needed Assess impairment of mobility on admission and as needed per policy Assess personal safety and home safety (as indicated) on admission and as needed Assess self care needs on admission and as needed Provide education on fall prevention Provide education on personal and home  safety Notes: Nutrition Nursing Diagnoses: Impaired glucose control: actual or potential Potential for alteratiion in Nutrition/Potential for imbalanced nutrition Goals: Patient/caregiver agrees to and verbalizes understanding of need to use nutritional supplements and/or vitamins as prescribed Date Initiated: 10/13/2021 Target Resolution Date: 11/13/2021 Goal Status: Active Patient/caregiver will maintain therapeutic glucose control Date Initiated: 10/13/2021 Target Resolution Date: 11/13/2021 Goal Status: Active Interventions: Assess HgA1c results as ordered upon admission and as needed Assess patient nutrition upon admission and as needed per policy Provide education on elevated blood sugars and impact on wound healing Provide education on nutrition Treatment Activities: Education provided on Nutrition : 10/13/2021 Notes: Wound/Skin Impairment Nursing Diagnoses: Impaired tissue integrity Knowledge deficit related to ulceration/compromised skin integrity Goals: Patient/caregiver will verbalize understanding of skin care regimen Date Initiated: 10/13/2021 Target Resolution Date: 11/13/2021 Goal Status: Active Interventions: Assess patient/caregiver ability to obtain necessary supplies Assess patient/caregiver ability to perform ulcer/skin care regimen upon admission and as needed Assess ulceration(s) every visit Provide education on ulcer and skin care Notes: Electronic Signature(s) Signed: 10/20/2021 4:57:05 PM By: Levan Hurst RN, BSN Entered By: Levan Hurst on 10/20/2021 07:48:51 -------------------------------------------------------------------------------- Pain Assessment Details Patient Name: Date of Service: [name], Stanley NA TO [place]. 10/20/2021 8:00 Stanley M Medical Record Number: [id number] Patient Account Number: [id number] Date of Birth/Sex: Treating RN: July 07, 1946 (75 y.o. [name] Primary Care Joellen Tullos: [name] Other  Clinician: Referring Ragen Laver: Treating Elorah Dewing/Extender: Edgardo Roys in Treatment: 1 Active Problems Location of Pain Severity and Description of Pain Patient Has Paino No Site Locations Pain Management and Medication Current Pain Management: Electronic Signature(s) Signed:  10/20/2021 4:57:05 PM By: Levan Hurst RN, BSN Entered By: Levan Hurst on 10/20/2021 07:47:51 -------------------------------------------------------------------------------- Patient/Caregiver Education Details Patient Name: Date of Service: [name], [name] 12/6/2022andnbsp8:00 Stanley M Medical Record Number: [id number] Patient Account Number: [id number] Date of Birth/Gender: Treating RN: [birth year] (75 y.o. [name] Primary Care Physician: [name] Other Clinician: Referring Physician: Treating Physician/Extender: Edgardo Roys in Treatment: 1 Education Assessment Education Provided To: Patient Education Topics Provided Wound/Skin Impairment: Methods: Explain/Verbal Responses: State content correctly Electronic Signature(s) Signed: 10/20/2021 4:57:05 PM By: Levan Hurst RN, BSN Entered By: Levan Hurst on 10/20/2021 07:49:04 -------------------------------------------------------------------------------- Wound Assessment Details Patient Name: Date of Service: [name], Stanley NA TO [place] S. 10/20/2021 8:00 Stanley M Medical Record Number: [id number] Patient Account Number: [id number] Date of Birth/Sex: Treating RN: 1946-09-29 (75 y.o. [name] Primary Care Verlyn Dannenberg: [name] Other Clinician: Referring Jerica Creegan: Treating Felicidad Sugarman/Extender: Edgardo Roys in Treatment: 1 Wound Status Wound Number: 1 Primary Venous Leg Ulcer Etiology: Wound Location: Right, Anterior Lower Leg Wound Open Wounding Event: Gradually Appeared Status: Date Acquired: 07/16/2021 Comorbid  Sleep Apnea, Arrhythmia, Coronary Artery Disease, Hypertension, Weeks Of Treatment: 1 History: Type II Diabetes, Received Chemotherapy Clustered Wound: No Photos Wound Measurements Length: (cm) 1.2 Width: (cm) 0.9 Depth: (cm) 0.2 Area: (cm) 0.848 Volume: (cm) 0.17 % Reduction in Area: 10% % Reduction in Volume: -80.9% Epithelialization: None Tunneling: No Undermining: No Wound Description Classification: Full Thickness Without Exposed Support Structures Wound Margin: Flat and Intact Exudate Amount: Medium Exudate Type: Serosanguineous Exudate Color: red, brown Foul Odor After Cleansing: No Slough/Fibrino Yes Wound Bed Granulation Amount: Large (67-100%) Exposed Structure Granulation Quality: Red, Pink Fascia Exposed: No Necrotic Amount: Small (1-33%) Fat Layer (Subcutaneous Tissue) Exposed: Yes Necrotic Quality: Adherent Slough Tendon Exposed: No Muscle Exposed: No Joint Exposed: No Bone Exposed: No Treatment Notes Wound #1 (Lower Leg) Wound Laterality: Right, Anterior Cleanser Soap and Water Discharge Instruction: May shower and wash wound with dial antibacterial soap and water prior to dressing change. Peri-Wound Care Sween Lotion (Moisturizing lotion) Discharge Instruction: Apply moisturizing lotion as directed Topical Primary Dressing KerraCel Ag Gelling Fiber Dressing, 2x2 in (silver alginate) Discharge Instruction: Apply silver alginate to wound bed as instructed Secondary Dressing Woven Gauze Sponge, Non-Sterile 4x4 in Discharge Instruction: Apply over primary dressing as directed. ABD Pad, 8x10 Discharge Instruction: Apply over primary dressing as directed. Secured With Compression Wrap CoFlex TLC XL 2-layer Compression System 4x7 (in/yd) Discharge Instruction: Apply CoFlex 2-layer compression as directed. (alt for 4 layer) Compression Stockings Add-Ons Electronic Signature(s) Signed: 10/20/2021 4:57:05 PM By: Levan Hurst RN, BSN Entered By:  Levan Hurst on 10/20/2021 08:12:04 -------------------------------------------------------------------------------- Wound Assessment Details Patient Name: Date of Service: [name], Stanley NA TO [place] S. 10/20/2021 8:00 Stanley M Medical Record Number: [id number] Patient Account Number: [id number] Date of Birth/Sex: Treating RN: 09/17/1946 (75 y.o. [name] Primary Care Gwendalynn Eckstrom: [name] Other Clinician: Referring Reis Goga: Treating Zayne Marovich/Extender: Edgardo Roys in Treatment: 1 Wound Status Wound Number: 2 Primary Venous Leg Ulcer Etiology: Wound Location: Left, Posterior Lower Leg Wound Open Wounding Event: Gradually Appeared Status: Date Acquired: 09/28/2021 Comorbid Sleep Apnea, Arrhythmia, Coronary Artery Disease, Hypertension, Weeks Of Treatment: 1 History: Type II Diabetes, Received Chemotherapy Clustered Wound: Yes Photos Wound Measurements Length: (cm) 4 Width: (cm) 2.3 Depth: (cm) 0.1 Clustered Quantity: 4 Area: (cm) 7.226 Volume: (cm) 0.723 % Reduction in Area: -192.1% % Reduction in Volume: -192.7% Epithelialization: None Tunneling: No Undermining: No  Wound Description Classification: Full Thickness Without Exposed Support Structures Wound Margin: Flat and Intact Exudate Amount: Medium Exudate Type: Serosanguineous Exudate Color: red, brown Foul Odor After Cleansing: No Slough/Fibrino Yes Wound Bed Granulation Amount: Small (1-33%) Exposed Structure Granulation Quality: Pink Fascia Exposed: No Necrotic Amount: Large (67-100%) Fat Layer (Subcutaneous Tissue) Exposed: Yes Necrotic Quality: Adherent Slough Tendon Exposed: No Muscle Exposed: No Joint Exposed: No Bone Exposed: No Treatment Notes Wound #2 (Lower Leg) Wound Laterality: Left, Posterior Cleanser Soap and Water Discharge Instruction: May shower and wash wound with dial antibacterial soap and water prior to dressing change. Peri-Wound  Care Sween Lotion (Moisturizing lotion) Discharge Instruction: Apply moisturizing lotion as directed Topical Primary Dressing KerraCel Ag Gelling Fiber Dressing, 2x2 in (silver alginate) Discharge Instruction: Apply silver alginate to wound bed as instructed Secondary Dressing Woven Gauze Sponge, Non-Sterile 4x4 in Discharge Instruction: Apply over primary dressing as directed. ABD Pad, 8x10 Discharge Instruction: Apply over primary dressing as directed. Secured With Compression Wrap CoFlex TLC XL 2-layer Compression System 4x7 (in/yd) Discharge Instruction: Apply CoFlex 2-layer compression as directed. (alt for 4 layer) Compression Stockings Add-Ons Electronic Signature(s) Signed: 10/20/2021 4:57:05 PM By: Levan Hurst RN, BSN Entered By: Levan Hurst on 10/20/2021 08:12:37 -------------------------------------------------------------------------------- Wound Assessment Details Patient Name: Date of Service: [name], Stanley NA TO [place] S. 10/20/2021 8:00 Stanley M Medical Record Number: [id number] Patient Account Number: [id number] Date of Birth/Sex: Treating RN: Jul 30, 1946 (75 y.o. [name] Primary Care Asberry Lascola: [name] Other Clinician: Referring Taquan Bralley: Treating Deoni Cosey/Extender: Edgardo Roys in Treatment: 1 Wound Status Wound Number: 3 Primary Atypical Etiology: Wound Location: Right Groin Wound Open Wounding Event: Gradually Appeared Status: Date Acquired: 07/16/2021 Comorbid Sleep Apnea, Arrhythmia, Coronary Artery Disease, Hypertension, Weeks Of Treatment: 1 History: Type II Diabetes, Received Chemotherapy Clustered Wound: Yes Photos Wound Measurements Length: (cm) 1.1 Width: (cm) 4.5 Depth: (cm) 0.1 Clustered Quantity: 3 Area: (cm) 3.888 Volume: (cm) 0.389 Wound Description Classification: Full Thickness Without Exposed Support Structures Wound Margin: Flat and Intact Exudate Amount: Medium Exudate Type:  Serosanguineous Exudate Color: red, brown Foul Odor After Cleansing: No Slough/Fibrino Ye % Reduction in Area: -23.7% % Reduction in Volume: 38.1% Epithelialization: None Tunneling: No Undermining: No s Wound Bed Granulation Amount: Small (1-33%) Exposed Structure Granulation Quality: Pink Fascia Exposed: No Necrotic Amount: Large (67-100%) Fat Layer (Subcutaneous Tissue) Exposed: Yes Necrotic Quality: Adherent Slough Tendon Exposed: No Muscle Exposed: No Joint Exposed: No Bone Exposed: No Treatment Notes Wound #3 (Groin) Wound Laterality: Right Cleanser Soap and Water Discharge Instruction: May shower and wash wound with dial antibacterial soap and water prior to dressing change. [place] Ancillary Kit - 15 Day Supply Discharge Instruction: Use supplies as instructed; Kit contains: (15) Saline Bullets; (15) 3x3 Gauze; 15 pr Gloves Peri-Wound Care Topical Primary Dressing KerraCel Ag Gelling Fiber Dressing, 2x2 in (silver alginate) Discharge Instruction: Apply silver alginate to wound bed as instructed Secondary Dressing Woven Gauze Sponge, Non-Sterile 4x4 in Discharge Instruction: Apply over primary dressing as directed. Bordered Gauze, 4x4 in Discharge Instruction: Apply over primary dressing as directed. Secured With Compression Wrap Compression Stockings Environmental education officer) Signed: 10/20/2021 4:57:05 PM By: Levan Hurst RN, BSN Entered By: Levan Hurst on 10/20/2021 08:13:16 -------------------------------------------------------------------------------- Wound Assessment Details Patient Name: Date of Service: [name], Stanley NA TO [place] S. 10/20/2021 8:00 Stanley M Medical Record Number: [id number] Patient Account Number: [id number] Date of Birth/Sex: Treating RN: 02-22-1946 (75 y.o. [name] Primary Care Moncia Annas: [name] Other Clinician: Referring Ritu Gagliardo: Treating  Jennell Janosik/Extender: Edgardo Roys in  Treatment: 1 Wound Status Wound Number: 4 Primary Atypical Etiology: Wound Location: Right, Lateral Abdomen - Lower Quadrant Wound Open Wounding Event: Gradually Appeared Status: Date Acquired: 10/20/2021 Comorbid Sleep Apnea, Arrhythmia, Coronary Artery Disease, Hypertension, Weeks Of Treatment: 0 Weeks Of Treatment: 0 History: Type II Diabetes, Received Chemotherapy Clustered Wound: No Photos Wound Measurements Length: (cm) 0.6 Width: (cm) 3.4 Depth: (cm) 0.1 Area: (cm) 1.602 Volume: (cm) 0.16 % Reduction in Area: 0% % Reduction in Volume: 0% Epithelialization: None Tunneling: No Undermining: No Wound Description Classification: Full Thickness Without Exposed Support Structures Wound Margin: Flat and Intact Exudate Amount: Medium Exudate Type: Serosanguineous Exudate Color: red, brown Foul Odor After Cleansing: No Slough/Fibrino Yes Wound Bed Granulation Amount: Medium (34-66%) Exposed Structure Granulation Quality: Pink Fascia Exposed: No Necrotic Amount: Medium (34-66%) Fat Layer (Subcutaneous Tissue) Exposed: Yes Necrotic Quality: Adherent Slough Tendon Exposed: No Muscle Exposed: No Joint Exposed: No Bone Exposed: No Treatment Notes Wound #4 (Abdomen - Lower Quadrant) Wound Laterality: Right, Lateral Cleanser Soap and Water Discharge Instruction: May shower and wash wound with dial antibacterial soap and water prior to dressing change. [place] Ancillary Kit - 15 Day Supply Discharge Instruction: Use supplies as instructed; Kit contains: (15) Saline Bullets; (15) 3x3 Gauze; 15 pr Gloves Peri-Wound Care Topical Primary Dressing KerraCel Ag Gelling Fiber Dressing, 2x2 in (silver alginate) Discharge Instruction: Apply silver alginate to wound bed as instructed Secondary Dressing Woven Gauze Sponge, Non-Sterile 4x4 in Discharge Instruction: Apply over primary dressing as directed. Bordered Gauze, 4x4 in Discharge Instruction: Apply over primary dressing as  directed. Secured With Compression Wrap Compression Stockings Environmental education officer) Signed: 10/20/2021 4:57:05 PM By: Levan Hurst RN, BSN Entered By: Levan Hurst on 10/20/2021 08:13:42 -------------------------------------------------------------------------------- Wound Assessment Details Patient Name: Date of Service: [name], Stanley NA TO [place] S. 10/20/2021 8:00 Stanley M Medical Record Number: [id number] Patient Account Number: [id number] Date of Birth/Sex: Treating RN: 1946-11-11 (75 y.o. [name] Primary Care Juandavid Dallman: [name] Other Clinician: Referring Donte Kary: Treating Eliya Bubar/Extender: Edgardo Roys in Treatment: 1 Wound Status Wound Number: 5 Primary Venous Leg Ulcer Etiology: Wound Location: Left, Proximal, Medial Lower Leg Wound Open Wounding Event: Blister Status: Date Acquired: 10/20/2021 Comorbid Sleep Apnea, Arrhythmia, Coronary Artery Disease, Hypertension, Weeks Of Treatment: 0 History: Type II Diabetes, Received Chemotherapy Clustered Wound: No Photos Wound Measurements Length: (cm) 1 Width: (cm) 0.8 Depth: (cm) 0.1 Area: (cm) 0.628 Volume: (cm) 0.063 % Reduction in Area: 0% % Reduction in Volume: 0% Epithelialization: None Tunneling: No Undermining: No Wound Description Classification: Full Thickness Without Exposed Support Structures Wound Margin: Flat and Intact Exudate Amount: Medium Exudate Type: Purulent Exudate Color: yellow, brown, green Foul Odor After Cleansing: No Slough/Fibrino Yes Wound Bed Granulation Amount: Large (67-100%) Exposed Structure Granulation Quality: Red Fascia Exposed: No Necrotic Amount: Small (1-33%) Fat Layer (Subcutaneous Tissue) Exposed: Yes Necrotic Quality: Adherent Slough Tendon Exposed: No Muscle Exposed: No Joint Exposed: No Bone Exposed: No Treatment Notes Wound #5 (Lower Leg) Wound Laterality: Left, Medial, Proximal Cleanser Soap and  Water Discharge Instruction: May shower and wash wound with dial antibacterial soap and water prior to dressing change. Peri-Wound Care Sween Lotion (Moisturizing lotion) Discharge Instruction: Apply moisturizing lotion as directed Topical Primary Dressing KerraCel Ag Gelling Fiber Dressing, 2x2 in (silver alginate) Discharge Instruction: Apply silver alginate to wound bed as instructed Secondary Dressing Woven Gauze Sponge, Non-Sterile 4x4 in Discharge Instruction: Apply over primary dressing as directed. ABD Pad, 8x10 Discharge  Instruction: Apply over primary dressing as directed. Secured With Compression Wrap CoFlex TLC XL 2-layer Compression System 4x7 (in/yd) Discharge Instruction: Apply CoFlex 2-layer compression as directed. (alt for 4 layer) Compression Stockings Add-Ons Electronic Signature(s) Signed: 10/20/2021 4:57:05 PM By: Levan Hurst RN, BSN Entered By: Levan Hurst on 10/20/2021 08:47:17 -------------------------------------------------------------------------------- [place] Details Patient Name: Date of Service: [name], Stanley NA TO [place] S. 10/20/2021 8:00 Stanley M Medical Record Number: [id number] Patient Account Number: [id number] Date of Birth/Sex: Treating RN: 01/13/1946 (75 y.o. [name] Primary Care Damian Hofstra: [name] Other Clinician: Referring Leng Montesdeoca: Treating Bronda Alfred/Extender: Edgardo Roys in Treatment: 1 Vital Signs Time Taken: 07:55 Temperature (F): 98.3 Pulse (bpm): 82 Respiratory Rate (breaths/min): 18 Blood Pressure (mmHg): 131/89 Reference Range: 80 - 120 mg / dl Electronic Signature(s) Signed: 10/20/2021 4:57:05 PM By: Levan Hurst RN, BSN Entered By: Levan Hurst on 10/20/2021 07:56:23

## 2021-10-20 NOTE — Progress Notes (Signed)
[name], [name] ([id number]) Visit Report for 10/20/2021 HPI Details Patient Name: Date of Service: [name] NA TO [place] S. 10/20/2021 8:00 A M Medical Record Number: [id number] Patient Account Number: [id number] Date of Birth/Sex: Treating RN: 13-Feb-1946 (75 y.o. M) Primary Care Provider: [name] Other Clinician: Referring Provider: Treating Provider/Extender: Edgardo Roys in Treatment: 1 History of Present Illness HPI Description: ADMISSION 10/13/2021 This is a 75 year old man who is actually from [place] speaking both [place] and [place]. We interviewed him through a [place] interpreter. That seemed to work quite well. The patient has had significant disease this year including diffuse large B-cell lymphoma stage III-IV. He is finished chemotherapy as directed by Dr. Lorenso Courier of oncology. Apparently he has had a good response. During the last visit in follow-up with Dr. Lorenso Courier on 09/23/2021 it was noted that he had bilateral lower extremity ulcers felt to be secondary to edema and the use of steroids as part of his chemotherapy. He is no longer on prednisone. He also has painful areas in the right inguinal area which apparently started as a large blister about 4 to 6 weeks ago and is contracted to 4 small areas according predominantly to the interpreter who seems to have been to most of his appointments. As noted he was in hospital with sepsis in July and had concerns for endocarditis treated with 6 weeks of antibiotics via PICC line. As noted he finished his [facility] EOP earlier this month. Past medical history includes diffuse large B-cell lymphoma is noted, atrial fibrillation on Eliquis and he is also on Plavix, lower extremity edema, coronary artery disease, type 2 diabetes. As noted possible endocarditis, sleep apnea and chronic A. fib on amiodarone His ABIs in our clinic were actually quite good at 1.07 and 1.04 10/20/2021; patient today arrives  with a new wound on the left anterior lower leg. The culture I did of the posterior area last week showed methicillin sensitive staph aureus. He states that the area in his right inguinal area is no longer is painful but he has a new open area in the pannus fold above this. I found myself wondering today whether this is all staff aureus related rather than anything more exotic related to his lymphoma or chemotherapy which ended about 3 weeks ago Electronic Signature(s) Signed: 10/20/2021 4:21:48 PM By: Linton Ham MD Entered By: Linton Ham on 10/20/2021 08:50:01 -------------------------------------------------------------------------------- Physical Exam Details Patient Name: Date of Service: [name], A NA TO [place]. 10/20/2021 8:00 A M Medical Record Number: [id number] Patient Account Number: [id number] Date of Birth/Sex: Treating RN: 1946-07-26 (75 y.o. M) Primary Care Provider: [name] Other Clinician: Referring Provider: Treating Provider/Extender: Edgardo Roys in Treatment: 1 Constitutional Sitting or standing Blood Pressure is within target range for patient.. Pulse regular and within target range for patient.Marland Kitchen Respirations regular, non-labored and within target range.. Temperature is normal and within the target range for the patient.. Notes Wound exam; he has a new pustular area on the left anterior upper tibia which looks like folliculitis. 2 areas on the right anterior and the area on the left posterior. Nothing has surrounding cellulitis. His edema control in his legs is a lot better On the right groin he has a raised area with several open areas in the middle of this. He has a new area just above this on the pannus fold which is similar. Again no evidence of active Electronic Signature(s) Signed: 10/20/2021 4:21:48 PM By: Linton Ham MD Entered  By: Linton Ham on 10/20/2021  08:52:22 -------------------------------------------------------------------------------- Physician Orders Details Patient Name: Date of Service: Stanley [name], A NA TO [place] S. 10/20/2021 8:00 A M Medical Record Number: [id number] Patient Account Number: [id number] Date of Birth/Sex: Treating RN: February 14, 1946 (75 y.o. [name] Primary Care Provider: [name] Other Clinician: Referring Provider: Treating Provider/Extender: Edgardo Roys in Treatment: 1 Verbal / Phone Orders: No Diagnosis Coding ICD-10 Coding Code Description [phone] Non-pressure chronic ulcer of other part of left lower leg with other specified severity L97.818 Non-pressure chronic ulcer of other part of right lower leg with other specified severity S31.103D Unspecified open wound of abdominal wall, right lower quadrant without penetration into peritoneal cavity, subsequent encounter I87.313 Chronic venous hypertension (idiopathic) with ulcer of bilateral lower extremity C83.30 Diffuse large B-cell lymphoma, unspecified site Follow-up Appointments ppointment in 1 week. - Dr. Dellia Nims Return A Bathing/ Shower/ Hygiene May shower with protection but do not get wound dressing(s) wet. - Ok to use Market researcher, can purchase at [facility], [facility], or [employer] Edema Control - Lymphedema / SCD / Other Elevate legs to the level of the heart or above for 30 minutes daily and/or when sitting, a frequency of: - throughout the day Avoid standing for long periods of time. Exercise regularly Wound Treatment Wound #1 - Lower Leg Wound Laterality: Right, Anterior Cleanser: Soap and Water 1 x Per Week Discharge Instructions: May shower and wash wound with dial antibacterial soap and water prior to dressing change. Peri-Wound Care: Sween Lotion (Moisturizing lotion) 1 x Per Week Discharge Instructions: Apply moisturizing lotion as directed Prim Dressing: KerraCel Ag Gelling Fiber Dressing, 2x2 in  (silver alginate) 1 x Per Week ary Discharge Instructions: Apply silver alginate to wound bed as instructed Secondary Dressing: Woven Gauze Sponge, Non-Sterile 4x4 in 1 x Per Week Discharge Instructions: Apply over primary dressing as directed. Secondary Dressing: ABD Pad, 8x10 1 x Per Week Discharge Instructions: Apply over primary dressing as directed. Compression Wrap: CoFlex TLC XL 2-layer Compression System 4x7 (in/yd) 1 x Per Week Discharge Instructions: Apply CoFlex 2-layer compression as directed. (alt for 4 layer) Wound #2 - Lower Leg Wound Laterality: Left, Posterior Cleanser: Soap and Water 1 x Per Week Discharge Instructions: May shower and wash wound with dial antibacterial soap and water prior to dressing change. Peri-Wound Care: Sween Lotion (Moisturizing lotion) 1 x Per Week Discharge Instructions: Apply moisturizing lotion as directed Prim Dressing: KerraCel Ag Gelling Fiber Dressing, 2x2 in (silver alginate) 1 x Per Week ary Discharge Instructions: Apply silver alginate to wound bed as instructed Secondary Dressing: Woven Gauze Sponge, Non-Sterile 4x4 in 1 x Per Week Discharge Instructions: Apply over primary dressing as directed. Secondary Dressing: ABD Pad, 8x10 1 x Per Week Discharge Instructions: Apply over primary dressing as directed. Compression Wrap: CoFlex TLC XL 2-layer Compression System 4x7 (in/yd) 1 x Per Week Discharge Instructions: Apply CoFlex 2-layer compression as directed. (alt for 4 layer) Wound #3 - Groin Wound Laterality: Right Cleanser: Soap and Water 1 x Per Day/15 Days Discharge Instructions: May shower and wash wound with dial antibacterial soap and water prior to dressing change. Cleanser: Byram Ancillary Kit - 15 Day Supply (Generic) 1 x Per Day/15 Days Discharge Instructions: Use supplies as instructed; Kit contains: (15) Saline Bullets; (15) 3x3 Gauze; 15 pr Gloves Prim Dressing: KerraCel Ag Gelling Fiber Dressing, 2x2 in (silver alginate)  (Generic) 1 x Per Day/15 Days ary Discharge Instructions: Apply silver alginate to wound bed as instructed Secondary Dressing: Woven Gauze  Sponge, Non-Sterile 4x4 in (Generic) 1 x Per Day/15 Days Discharge Instructions: Apply over primary dressing as directed. Secondary Dressing: Bordered Gauze, 4x4 in (Generic) 1 x Per Day/15 Days Discharge Instructions: Apply over primary dressing as directed. Wound #4 - Abdomen - Lower Quadrant Wound Laterality: Right, Lateral Cleanser: Soap and Water 1 x Per Day/15 Days Discharge Instructions: May shower and wash wound with dial antibacterial soap and water prior to dressing change. Cleanser: Byram Ancillary Kit - 15 Day Supply (Generic) 1 x Per Day/15 Days Discharge Instructions: Use supplies as instructed; Kit contains: (15) Saline Bullets; (15) 3x3 Gauze; 15 pr Gloves Prim Dressing: KerraCel Ag Gelling Fiber Dressing, 2x2 in (silver alginate) (Generic) 1 x Per Day/15 Days ary Discharge Instructions: Apply silver alginate to wound bed as instructed Secondary Dressing: Woven Gauze Sponge, Non-Sterile 4x4 in (Generic) 1 x Per Day/15 Days Discharge Instructions: Apply over primary dressing as directed. Secondary Dressing: Bordered Gauze, 4x4 in (Generic) 1 x Per Day/15 Days Discharge Instructions: Apply over primary dressing as directed. Wound #5 - Lower Leg Wound Laterality: Left, Medial, Proximal Cleanser: Soap and Water 1 x Per Week Discharge Instructions: May shower and wash wound with dial antibacterial soap and water prior to dressing change. Peri-Wound Care: Sween Lotion (Moisturizing lotion) 1 x Per Week Discharge Instructions: Apply moisturizing lotion as directed Prim Dressing: KerraCel Ag Gelling Fiber Dressing, 2x2 in (silver alginate) 1 x Per Week ary Discharge Instructions: Apply silver alginate to wound bed as instructed Secondary Dressing: Woven Gauze Sponge, Non-Sterile 4x4 in 1 x Per Week Discharge Instructions: Apply over primary  dressing as directed. Secondary Dressing: ABD Pad, 8x10 1 x Per Week Discharge Instructions: Apply over primary dressing as directed. Compression Wrap: CoFlex TLC XL 2-layer Compression System 4x7 (in/yd) 1 x Per Week Discharge Instructions: Apply CoFlex 2-layer compression as directed. (alt for 4 layer) Consults Dermatology - Dr. Denna Haggard - Atypical wounds on right groin and abdomen - (ICD10 S31.103D - Unspecified open wound of abdominal wall, right lower quadrant without penetration into peritoneal cavity, subsequent encounter) Laboratory naerobe culture (MICRO) - Left medial lower leg - (ICD10 D74.128 - Non-pressure chronic ulcer Bacteria identified in Unspecified specimen by A of other part of left lower leg with other specified severity) LOINC Code: 786-7 Convenience Name: Anerobic culture Electronic Signature(s) Signed: 10/20/2021 4:21:48 PM By: Linton Ham MD Signed: 10/20/2021 4:57:05 PM By: Levan Hurst RN, BSN Entered By: Levan Hurst on 10/20/2021 08:45:12 Prescription 10/20/2021 -------------------------------------------------------------------------------- Billey Chang MD Patient Name: Provider: 01-31-1946 [id number] Date of Birth: NPI#Jerilynn Mages [id number] Sex: DEA #: [phone] [id number] Phone #: License #: [place] Patient Address: [address] [place], [place] [address] [place], [place] [address] [phone] Allergies No Known Drug Allergies Provider's Orders Dermatology - Dr. Denna Haggard - ICD10: S31.103D - Atypical wounds on right groin and abdomen Hand Signature: Date(s): Electronic Signature(s) Signed: 10/20/2021 4:21:48 PM By: Linton Ham MD Signed: 10/20/2021 4:57:05 PM By: Levan Hurst RN, BSN Entered By: Levan Hurst on 10/20/2021 08:45:13 -------------------------------------------------------------------------------- Problem List Details Patient Name: Date  of Service: Stanley Bari Edward, A NA TO [place] S. 10/20/2021 8:00 A M Medical Record Number: [id number] Patient Account Number: [id number] Date of Birth/Sex: Treating RN: 02/04/1946 (75 y.o. [name] Primary Care Provider: [name] Other Clinician: Referring Provider: Treating Provider/Extender: Edgardo Roys in Treatment: 1 Active Problems ICD-10 Encounter Code Description Active Date MDM Diagnosis L97.828 Non-pressure chronic ulcer of other  part of left lower leg with other specified 10/13/2021 No Yes severity L97.818 Non-pressure chronic ulcer of other part of right lower leg with other specified 10/13/2021 No Yes severity S31.103D Unspecified open wound of abdominal wall, right lower quadrant without 10/13/2021 No Yes penetration into peritoneal cavity, subsequent encounter I87.313 Chronic venous hypertension (idiopathic) with ulcer of bilateral lower extremity 10/13/2021 No Yes C83.30 Diffuse large B-cell lymphoma, unspecified site 10/13/2021 No Yes B95.61 Methicillin susceptible Staphylococcus aureus infection as the cause of 10/20/2021 No Yes diseases classified elsewhere Inactive Problems Resolved Problems Electronic Signature(s) Signed: 10/20/2021 4:21:48 PM By: Linton Ham MD Entered By: Linton Ham on 10/20/2021 08:48:28 -------------------------------------------------------------------------------- Progress Note Details Patient Name: Date of Service: [name], A NA TO [place] S. 10/20/2021 8:00 A M Medical Record Number: [id number] Patient Account Number: [id number] Date of Birth/Sex: Treating RN: [birth year] (75 y.o. M) Primary Care Provider: [name] Other Clinician: Referring Provider: Treating Provider/Extender: Edgardo Roys in Treatment: 1 Subjective History of Present Illness (HPI) ADMISSION 10/13/2021 This is a 75 year old man who is actually from [place] speaking both [place]  and [place]. We interviewed him through a [place] interpreter. That seemed to work quite well. The patient has had significant disease this year including diffuse large B-cell lymphoma stage III-IV. He is finished chemotherapy as directed by Dr. Lorenso Courier of oncology. Apparently he has had a good response. During the last visit in follow-up with Dr. Lorenso Courier on 09/23/2021 it was noted that he had bilateral lower extremity ulcers felt to be secondary to edema and the use of steroids as part of his chemotherapy. He is no longer on prednisone. He also has painful areas in the right inguinal area which apparently started as a large blister about 4 to 6 weeks ago and is contracted to 4 small areas according predominantly to the interpreter who seems to have been to most of his appointments. As noted he was in hospital with sepsis in July and had concerns for endocarditis treated with 6 weeks of antibiotics via PICC line. As noted he finished his [facility] EOP earlier this month. Past medical history includes diffuse large B-cell lymphoma is noted, atrial fibrillation on Eliquis and he is also on Plavix, lower extremity edema, coronary artery disease, type 2 diabetes. As noted possible endocarditis, sleep apnea and chronic A. fib on amiodarone His ABIs in our clinic were actually quite good at 1.07 and 1.04 10/20/2021; patient today arrives with a new wound on the left anterior lower leg. The culture I did of the posterior area last week showed methicillin sensitive staph aureus. He states that the area in his right inguinal area is no longer is painful but he has a new open area in the pannus fold above this. I found myself wondering today whether this is all staff aureus related rather than anything more exotic related to his lymphoma or chemotherapy which ended about 3 weeks ago Objective Constitutional Sitting or standing Blood Pressure is within target range for patient.. Pulse regular and within target range  for patient.Marland Kitchen Respirations regular, non-labored and within target range.. Temperature is normal and within the target range for the patient.. Vitals Time Taken: 7:55 AM, Temperature: 98.3 F, Pulse: 82 bpm, Respiratory Rate: 18 breaths/min, Blood Pressure: 131/89 mmHg. General Notes: Wound exam; he has a new pustular area on the left anterior upper tibia which looks like folliculitis. 2 areas on the right anterior and the area on the left posterior. Nothing has surrounding cellulitis. His edema control in his  legs is a lot better oo On the right groin he has a raised area with several open areas in the middle of this. He has a new area just above this on the pannus fold which is similar. Again no evidence of active Integumentary (Hair, Skin) Wound #1 status is Open. Original cause of wound was Gradually Appeared. The date acquired was: 07/16/2021. The wound has been in treatment 1 weeks. The wound is located on the Right,Anterior Lower Leg. The wound measures 1.2cm length x 0.9cm width x 0.2cm depth; 0.848cm^2 area and 0.17cm^3 volume. There is Fat Layer (Subcutaneous Tissue) exposed. There is no tunneling or undermining noted. There is a medium amount of serosanguineous drainage noted. The wound margin is flat and intact. There is large (67-100%) red, pink granulation within the wound bed. There is a small (1-33%) amount of necrotic tissue within the wound bed including Adherent Slough. Wound #2 status is Open. Original cause of wound was Gradually Appeared. The date acquired was: 09/28/2021. The wound has been in treatment 1 weeks. The wound is located on the Left,Posterior Lower Leg. The wound measures 4cm length x 2.3cm width x 0.1cm depth; 7.226cm^2 area and 0.723cm^3 volume. There is Fat Layer (Subcutaneous Tissue) exposed. There is no tunneling or undermining noted. There is a medium amount of serosanguineous drainage noted. The wound margin is flat and intact. There is small (1-33%) pink  granulation within the wound bed. There is a large (67-100%) amount of necrotic tissue within the wound bed including Adherent Slough. Wound #3 status is Open. Original cause of wound was Gradually Appeared. The date acquired was: 07/16/2021. The wound has been in treatment 1 weeks. The wound is located on the Right Groin. The wound measures 1.1cm length x 4.5cm width x 0.1cm depth; 3.888cm^2 area and 0.389cm^3 volume. There is Fat Layer (Subcutaneous Tissue) exposed. There is no tunneling or undermining noted. There is a medium amount of serosanguineous drainage noted. The wound margin is flat and intact. There is small (1-33%) pink granulation within the wound bed. There is a large (67-100%) amount of necrotic tissue within the wound bed including Adherent Slough. Wound #4 status is Open. Original cause of wound was Gradually Appeared. The date acquired was: 10/20/2021. The wound is located on the Right,Lateral Abdomen - Lower Quadrant. The wound measures 0.6cm length x 3.4cm width x 0.1cm depth; 1.602cm^2 area and 0.16cm^3 volume. There is Fat Layer (Subcutaneous Tissue) exposed. There is no tunneling or undermining noted. There is a medium amount of serosanguineous drainage noted. The wound margin is flat and intact. There is medium (34-66%) pink granulation within the wound bed. There is a medium (34-66%) amount of necrotic tissue within the wound bed including Adherent Slough. Wound #5 status is Open. Original cause of wound was Blister. The date acquired was: 10/20/2021. The wound is located on the Left,Proximal,Medial Lower Leg. The wound measures 1cm length x 0.8cm width x 0.1cm depth; 0.628cm^2 area and 0.063cm^3 volume. There is Fat Layer (Subcutaneous Tissue) exposed. There is no tunneling or undermining noted. There is a medium amount of purulent drainage noted. The wound margin is flat and intact. There is large (67-100%) red granulation within the wound bed. There is a small (1-33%) amount  of necrotic tissue within the wound bed including Adherent Slough. 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 - Dr. Dellia Nims Bathing/ Shower/ Hygiene: May shower with protection but do not get wound dressing(s) wet. - Ok to use Market researcher, can purchase at [facility], [facility], or [employer] Edema Control - Lymphedema / SCD / Other: Elevate legs to the level of the heart or above for 30 minutes daily and/or when sitting, a frequency of: - throughout the day Avoid standing for long periods of time. Exercise regularly Laboratory ordered were: Anerobic culture - Left medial lower leg Consults ordered were: Dermatology - Dr. Denna Haggard - Atypical wounds on right groin and abdomen WOUND #1: - Lower Leg Wound Laterality: Right, Anterior Cleanser: Soap and Water 1 x Per Week/ Discharge Instructions: May shower and wash wound with dial antibacterial soap and water prior to dressing change. Peri-Wound Care: Sween Lotion (Moisturizing lotion) 1 x Per Week/ Discharge Instructions: Apply moisturizing lotion as directed Prim Dressing: KerraCel Ag Gelling Fiber Dressing, 2x2 in (silver alginate) 1 x Per Week/ ary Discharge Instructions: Apply silver alginate to wound bed as instructed Secondary Dressing: Woven Gauze Sponge, Non-Sterile 4x4 in 1 x Per Week/ Discharge Instructions: Apply over primary dressing as directed. Secondary  Dressing: ABD Pad, 8x10 1 x Per Week/ Discharge Instructions: Apply over primary dressing as directed. Com pression Wrap: CoFlex TLC XL 2-layer Compression System 4x7 (in/yd) 1 x Per Week/ Discharge Instructions: Apply CoFlex 2-layer compression as directed. (alt for 4 layer) WOUND #2: - Lower Leg Wound Laterality: Left, Posterior Cleanser: Soap and Water 1 x Per Week/ Discharge Instructions: May shower and wash wound with dial antibacterial soap and water prior to dressing change. Peri-Wound Care: Sween Lotion (Moisturizing lotion) 1 x Per Week/ Discharge Instructions: Apply moisturizing lotion as directed Prim Dressing: KerraCel Ag Gelling Fiber Dressing, 2x2 in (silver alginate) 1 x Per Week/ ary Discharge Instructions: Apply silver alginate to wound bed as instructed Secondary Dressing: Woven Gauze Sponge, Non-Sterile 4x4 in 1 x Per Week/ Discharge Instructions: Apply over primary dressing as directed. Secondary Dressing: ABD Pad, 8x10 1 x Per Week/ Discharge Instructions: Apply over primary dressing as directed. Com pression Wrap: CoFlex TLC XL 2-layer Compression System 4x7 (in/yd) 1 x Per Week/ Discharge Instructions: Apply CoFlex 2-layer compression as directed. (alt for 4 layer) WOUND #3: - Groin Wound Laterality: Right Cleanser: Soap and Water 1 x Per XHF/41 Days Discharge Instructions: May shower and wash wound with dial antibacterial soap and water prior to dressing change. Cleanser: Byram Ancillary Kit - 15 Day Supply (Generic) 1 x Per Day/15 Days Discharge Instructions: Use supplies as instructed; Kit contains: (15) Saline Bullets; (15) 3x3 Gauze; 15 pr Gloves Prim Dressing: KerraCel Ag Gelling Fiber Dressing, 2x2 in (silver alginate) (Generic) 1 x Per Day/15 Days ary Discharge Instructions: Apply silver alginate to wound bed as instructed Secondary Dressing: Woven Gauze Sponge, Non-Sterile 4x4 in (Generic) 1 x Per Day/15 Days Discharge Instructions: Apply over primary  dressing as directed. Secondary Dressing: Bordered Gauze, 4x4 in (Generic) 1 x Per Day/15 Days Discharge Instructions: Apply over primary dressing as directed. WOUND #4: - Abdomen - Lower Quadrant Wound Laterality: Right, Lateral Cleanser: Soap and Water 1 x Per Day/15 Days Discharge Instructions: May shower and wash wound with dial antibacterial soap and water prior to dressing change. Cleanser:  Byram Ancillary Kit - 15 Day Supply (Generic) 1 x Per Day/15 Days Discharge Instructions: Use supplies as instructed; Kit contains: (15) Saline Bullets; (15) 3x3 Gauze; 15 pr Gloves Prim Dressing: KerraCel Ag Gelling Fiber Dressing, 2x2 in (silver alginate) (Generic) 1 x Per Day/15 Days ary Discharge Instructions: Apply silver alginate to wound bed as instructed Secondary Dressing: Woven Gauze Sponge, Non-Sterile 4x4 in (Generic) 1 x Per Day/15 Days Discharge Instructions: Apply over primary dressing as directed. Secondary Dressing: Bordered Gauze, 4x4 in (Generic) 1 x Per Day/15 Days Discharge Instructions: Apply over primary dressing as directed. WOUND #5: - Lower Leg Wound Laterality: Left, Medial, Proximal Cleanser: Soap and Water 1 x Per Week/ Discharge Instructions: May shower and wash wound with dial antibacterial soap and water prior to dressing change. Peri-Wound Care: Sween Lotion (Moisturizing lotion) 1 x Per Week/ Discharge Instructions: Apply moisturizing lotion as directed Prim Dressing: KerraCel Ag Gelling Fiber Dressing, 2x2 in (silver alginate) 1 x Per Week/ ary Discharge Instructions: Apply silver alginate to wound bed as instructed Secondary Dressing: Woven Gauze Sponge, Non-Sterile 4x4 in 1 x Per Week/ Discharge Instructions: Apply over primary dressing as directed. Secondary Dressing: ABD Pad, 8x10 1 x Per Week/ Discharge Instructions: Apply over primary dressing as directed. Com pression Wrap: CoFlex TLC XL 2-layer Compression System 4x7 (in/yd) 1 x Per Week/ Discharge  Instructions: Apply CoFlex 2-layer compression as directed. (alt for 4 layer) #1 I wonder if all of this is related to staff aureus skin infections/folliculitis this may be transmitted by scratching etc. 2. Will using silver alginate under compression on his legs I will continue this 3. Cephalexin for 10 days. Might benefit from topical Bactroban 4. I have set him up with dermatology although that may not be necessary if I am correct about the staff aureus issues 5. I also had some thoughts about some of this being either malignant or related to chemotherapy I am less concerned about that today Electronic Signature(s) Signed: 10/20/2021 4:21:48 PM By: Linton Ham MD Entered By: Linton Ham on 10/20/2021 08:54:14 -------------------------------------------------------------------------------- SuperBill Details Patient Name: Date of Service: [name], A NA TO [name] 10/20/2021 Medical Record Number: [id number] Patient Account Number: [id number] Date of Birth/Sex: Treating RN: September 25, 1946 (75 y.o. 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 Modifier: Quantity: 1 Physician Procedures : CPT4 Code Description Modifier [id number] - WC PHYS LEVEL 4 - EST PT ICD-10 Diagnosis Description B95.61 Methicillin susceptible Staphylococcus aureus infection as the cause of diseases classified elsewhere L97.828 Non-pressure chronic ulcer of  other part of left lower leg with other specified severity L97.818 Non-pressure chronic ulcer of other part of right lower leg with other specified severity S31.103D Unspecified open wound of abdominal wall, right lower quadrant without penetration into  peritoneal cavi subsequent encounter Quantity: 1 ty,  Electronic Signature(s) Signed: 10/20/2021 4:21:48 PM By: Linton Ham MD Signed: 10/20/2021 4:57:05 PM By: Levan Hurst RN, BSN Entered By: Levan Hurst on 10/20/2021 11:59:18

## 2021-10-22 ENCOUNTER — Other Ambulatory Visit: Payer: Self-pay

## 2021-10-22 ENCOUNTER — Encounter (HOSPITAL_BASED_OUTPATIENT_CLINIC_OR_DEPARTMENT_OTHER): Payer: Self-pay | Admitting: Urology

## 2021-10-22 NOTE — Progress Notes (Addendum)
Spoke w/ via phone for pre-op interview---pt daughter yelena yaretskaya cell [phone] speaks [place]----   I stat            Lab results------see below COVID test -----patient  daughter states asymptomatic no test needed Arrive at -------845 am 10-27-2021 NPO after MN NO Solid Food.   Water  from MN until---745 am  then npo Med rec completed Medications to take morning of surgery -----amlodipine, metoprolol tartrate, amiodarone, isosorbide mononitrate, oxycodone prn Diabetic medication -----take 1/2 dose of hs lantus insulin on 07-27-2021 (take 17 units), no diabetic meds am of surgery Patient instructed to bring photo id and insurance card day of surgery Patient aware to have Driver (ride ) / caregiver   daughter yelena yartskaya cell [phone] will drop pt off for 24 hours after surgery  Patient Special Instructions -----pt unable to bring cpap mask and tubign on medicaid bus dropping pt off day of surgery Pre-Op special Istructions -----none Patient verbalized understanding of instructions that were given at this phone interview. Patient denies shortness of breath, chest pain, fever, cough at this phone interview.   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, complicated uti finished antibiotics 07-08-2021 (lov dr c Lucianne Lei dam 06-18-2021 epic) daughter of pt denies cardiac s & s or sob for pt today at pre op call  Chart  and history reviewed reviewed with dr c bass mda pt ok for [facility] per dr c bass mda for 07-28-2021 surgery, pt had 07-28-2021 surgery at [place] without problems  PCP: dr Gerrit Heck 05-22-2021 [facility] Cardiologist :dr g Cheryl Flash 04-02-2021 [facility] Chest x-ray :12-30-2020 [facility] EKG :05-23-2021 epic/chart Echo :06-01-2021 epic Stress test:none Cardiac Cath : last done 11-21-2020 with  drug eluding stent to  right [facility] Oncology lov dr j Lorenso Courier 09-23-2021 epic Activity level: walks with walker for distances can climb  1 or 2  stairs without sob  due to poor mobility status per daughter Sleep Study/ CPAP :uses cpap unable to bring cpap on medicaid transportation per daughter yelena Fasting Blood Sugar :      / Checks Blood Sugar -- times a day:  blood sugar normal per daughter Blood Thinner/ Instructions /Last Dose:lASA / pt to remain on eliquis per dr [name], pt daughter aware Instructions/ Last Dose :  last dose of eliquis will be 10-26-2021   [place] interpreter requested for day of surgery email on chart  Pt daughter yelena states Nani Ravens is usual [place] interpreter and will ride on medicaid bus taking pt home day of surgery and pt wife will be caregiver.email sent to interpeting services to confirm [place] for [place] interpreter for day of surgery. Pt daughter yelena states she can pick pt up after 500 pm day of surgery if needed to take patient home.

## 2021-10-23 ENCOUNTER — Telehealth: Payer: Self-pay | Admitting: Hematology and Oncology

## 2021-10-23 ENCOUNTER — Telehealth: Payer: Self-pay | Admitting: *Deleted

## 2021-10-23 NOTE — Telephone Encounter (Signed)
Scheduled per sch msg. Called and left msg  

## 2021-10-23 NOTE — Telephone Encounter (Signed)
TCT patient's daughter, Derrick Ravel to advise of PET Scan appt. No answer but was able to leave detailed message on her identified phone. PET scan is scheduled for 11/04/21 @ 9am, arrival time of 8:30. Pt can have water only for 6 hours prior to scan. No food, carbs or sugar. For 6 hours prior to scan.  Hold insulin 8 hours prior to exam. Advised that we will re-schedule his appt with Dr. Lorenso Courier after the PET scan. Cancelled appt on 10/28/21  Provide phone # [phone] for any questions or concerns.  Scheduling message sent

## 2021-10-23 NOTE — Telephone Encounter (Signed)
Received call back from daughter, Derrick Ravel confirming receipt of the message sent earlier today.

## 2021-10-26 LAB — AEROBIC/ANAEROBIC CULTURE W GRAM STAIN (SURGICAL/DEEP WOUND)

## 2021-10-26 NOTE — H&P (Signed)
Patient is a 75 year old white male from [place] who was seen as a hospital consult on 04/30/2021. He was seen with some abdominal discomfort and during CT evaluation was found to have lymphadenopathy in the retroperitoneum which was extrinsically compressing on the right ureter causing some hydronephrosis. Was also having renal insufficiency. Subsequently underwent cysto insertion of right JJ stent on 05/01/2021 by Dr.Eskridge who was on-call that day. Patient had mild improvement in renal insufficiency. Ultimately had lymph node biopsy which confirmed large B-cell lymphoma. He is scheduled undergo Oncology evaluation. The patient has had some voiding issues prior to insertion of his stent but stent bother has caused him to worsen. Currently most of the issue is urgency and postvoid dribbling and decreased force of stream. I spoke on the telephone with his daughter who translated today. She states that he is having significant bladder outlet symptoms prior to his recent hospitalization mainly consisting of urgency urge incontinence nocturia 4-5 times at night and feelings of incomplete emptying. He has been on terazosin 2 mg daily for this per his PCP.  Previous CT scan during recent hospitalization did show prostatomegaly.  For BPH symptomatology initiated silodosin 8 mg daily in lieu of the current terazosin. Also initiated finasteride 5 mg daily. I am going to see him back here in 6 weeks for follow-up PVR. He will meet with Hematology Oncology in the interim regarding management of his lymphoma. Ultimately will see how he response to therapy regarding removal or exchange of right ureteral stent.  -06/24/21-patient with history of diffuse B-cell lymphoma with associated right-sided hydronephrosis from extrinsic ureteral compression. Underwent cysto insertion of right JJ stent on 05/01/2021. Patient has had normalization of renal function since placement of the stent. He unfortunately has had a fair amount of  stent bother and also has marked BPH and has been on silodosin 8 mg daily and finasteride 5 mg daily. He was recently readmitted to the hospital on 05/23/2021 with Pseudomonas bacteremia in may have had endocarditis for which he is undergoing 6 week course of IV antibiotics. He now has a PICC line. Also undergoing chemotherapy for the B-cell lymphoma. He continues to have significant hematuria but is on anticoagulation with history of blood clots in the past. He is now here for follow-up. In the interim the patient continued to have hematuria which is his biggest concern. He also has had urinary frequency. He thinks that terazosin works better than silodosin in terms of reducing the urinary frequency. He also thinks that finasteride made urinary frequency worse after re-initiated the medicine but he only took it for a few days and this was repaired right before he was rehospitalized for UTI. He therefore has been basically back on terazosin over the last couple of weeks and can sleep now about 2 hours at a time at night. He is not passing any clots. No dysuria.   -10/02/21-patient with B-cell lymphoma which caused extrinsic compression on the upper portion of the ureter and high a subsequent secondary hydronephrosis. Underwent cystoscopy insertion of right JJ stent on 05/01/2021 and was exchanged on 07/28/2021. Retrograde at that time can showed some continued narrowing and he was in the middle of chemotherapy cystoscopy stent was exchanged. He is now here for follow-up. He has completed his chemotherapy 09/19/21 he has had recent PET CT scan 09/08/21 which shows marked reduction in the lymphadenopathy but still evidence of some lymph node enlargement in the region of the right ureter. See report below.   CLINICAL DATA: Subsequent treatment  strategy for hematologic  malignancy, assess treatment response in a 75 year old male.     EXAM:  NUCLEAR MEDICINE PET SKULL BASE TO THIGH     TECHNIQUE:  12.2 mCi F-18  FDG was injected intravenously. Full-ring PET imaging  was performed from the skull base to thigh after the radiotracer. CT  data was obtained and used for attenuation correction and anatomic  localization.     Fasting blood glucose: 155 mg/dl     COMPARISON: June 03, 2021.     FINDINGS:  Mediastinal blood pool activity: SUV max 1.93     Liver activity: SUV max 3.04     NECK: Scattered small lymph nodes throughout the neck are diminished  in size. For instance, the index node seen at RIGHT level II/III on  the prior study was previously 9 mm. Largest nodes in the neck are  now less than 5 mm size. (Image 38/4) maximum uptake in lymph nodes  in the neck currently in the range of 1 or less.     Hypermetabolic focus in the LEFT thyroid gland corresponds to small  nodule with a maximum SUV of 5.7 (image 49/4) this measures 7 mm.     Incidental CT findings: none     CHEST: Bilateral axillary lymph nodes have nearly completely  resolved since previous imaging. Likewise mediastinal lymph nodes  have markedly improved and or resolved since prior imaging.     Index LEFT axillary lymph node as perhaps the largest remaining  lymph node in the LEFT axilla (image 61/4) maximum SUV of 1.3 as  compared to 3.3 with a short axis dimension of 8 mm. Largest node in  this same area previously approximately 14 mm short axis.     RIGHT paratracheal lymph node (image 64/4) 9 mm short axis with a  maximum SUV of 1.3 as compared to 3.6. This represents the largest  node remaining in the mediastinum. No hilar adenopathy. No  suspicious pulmonary nodule with increased metabolic activity     Incidental CT findings: 4.6 cm ascending thoracic aortic caliber is  similar to prior imaging. Heart size stable enlarged with calcified  coronary artery disease no substantial pericardial effusion. Central  pulmonary vasculature is enlarged at 3.7 cm. RIGHT-sided PICC line  has been withdrawn into the high RIGHT  axillary vein just in the  area of the subclavian transition (image 57/4     ABDOMEN/PELVIS: Splenic activity is greater than liver but without  focal features. Splenic size may however be slightly diminished  compared to the previous study at 17 as compared to 18 cm, still  within the realm of measurement air. Splenic uptake at 3.6 as  compared to 3.5 for maximum SUV. No adenopathy with increased  metabolic activity in the abdomen or pelvis. Area of greatest uptake  on the previous study along LEFT common iliac vessels with largest  lymph node in this area at 8 mm (image 168/4) previously this lymph  node measures 13 mm and a larger lymph node measuring 15 mm short  axis were seen in this location. Previous maximum SUV in this area  6.2, current maximum SUV of 2.3. Similar decrease in size of pelvic  lymph nodes. Largest remaining pelvic lymph node (image 187/4) 13 mm  short axis displaying a maximum SUV of 2.1. Slightly smaller nodes  adjacent to the distal RIGHT ureter with similar minimal FDG uptake.  No new nodal disease.     There is diminished soft tissue about the proximal  RIGHT ureter.  This measures 2.9 x 2.0 cm as compared to 5.4 x 4.2 cm. Uptake  related to this soft tissue is difficult to assess but grossly  similar to other areas that have been described. Estimated at 2.1  for maximum SUV previously as much as 13.8 on the prior study.  Difficulty arises given passage of the ureter through this area.  Stent remaining in place without hydronephrosis.     Incidental CT findings: Liver, gallbladder, pancreas, spleen,  adrenal glands and kidneys without acute findings. No RIGHT-sided  hydronephrosis. Perinephric stranding with similar appearance to  prior imaging. Aortic atherosclerosis without aneurysmal change in  the abdominal aorta. No acute gastrointestinal findings.     SKELETON: Heterogeneous marrow uptake without discrete lesion to  indicate bony involvement      Healing of RIGHT-sided rib fractures that were present on previous  imaging of ribs 5, 6, 7, 8 and 9.     Incidental CT findings: none     IMPRESSION:  Marked interval improvement with respect to nodal disease in the  neck, chest, abdomen and pelvis, near complete resolution of visible  lymph nodes. Uptake only slightly above and less than mediastinal  blood pool, all areas less than liver currently, Deauville criteria  2/3.     Spleen remains enlarged with mild increased metabolic activity  relative to liver but without focal abnormality.     Heterogeneous marrow uptake is nonspecific. Continued attention on  follow-up.     LEFT thyroid lesion with focal increased metabolic activity.  Recommend thyroid US and biopsy (ref: J Am Coll Radiol. 2015  Feb;12(2): 143-50).     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 Recommend semi-annual imaging followup by CTA or  MRA and referral to cardiothoracic surgery if not already obtained.  This recommendation follows 2010  ACCF/AHA/AATS/ACR/ASA/SCA/SCAI/SIR/STS/SVM Guidelines for the  Diagnosis and Management of Patients With Thoracic Aortic Disease.  Circulation. 2010; 121: E563-J497. Aortic aneurysm NOS (ICD10-I71.9)        Electronically Signed  By: Zetta Bills M.D.  On: 09/08/2021 14:57     ALLERGIES: None   MEDICATIONS: Allopurinol  Finasteride 5 mg tablet 1 tablet PO Daily  Metformin Hcl  Metoprolol Tartrate  Terazosin Hcl  Amiodarone Hcl  Amlodipine Besilate  Atorvastatin Calcium  Clopidogrel  Eliquis  Glipizide  Isosorbide  Lantus  Silodosin 8 mg capsule 1 capsule PO Daily     GU PSH: Cystoscopy Insert Stent - 07/28/2021     NON-GU PSH: No Non-GU PSH    GU PMH: BPH w/LUTS - 06/24/2021, - 05/14/2021 Hydronephrosis - 06/24/2021 Weak Urinary Stream - 06/24/2021, - 05/14/2021 End-Stage Renal Disease      PMH Notes: heart disease    NON-GU PMH: Diffuse large B-cell  lymphoma, lymph nodes of multiple sites - 06/24/2021, - 05/14/2021 Diabetes Type 2 Hypertension Sleep Apnea    FAMILY HISTORY: No Family History    SOCIAL HISTORY: Marital Status: Married Preferred Language: English; Ethnicity: Not Hispanic Or Latino; Race: White Current Smoking Status: Patient has never smoked.   Tobacco Use Assessment Completed: Used Tobacco in last 30 days? Does not use smokeless tobacco. Does not use drugs. Drinks 2 caffeinated drinks per day. Has not had a blood transfusion.    REVIEW OF SYSTEMS:    GU Review Male:   Patient reports frequent urination. Patient denies hard to postpone urination, burning/ pain with urination, get up at night to urinate, leakage of urine, stream starts  and stops, trouble starting your stream, have to strain to urinate , erection problems, and penile pain.  Gastrointestinal (Upper):   Patient denies nausea, vomiting, and indigestion/ heartburn.  Gastrointestinal (Lower):   Patient denies diarrhea and constipation.  Constitutional:   Patient denies fever, night sweats, weight loss, and fatigue.  Skin:   Patient denies skin rash/ lesion and itching.  Eyes:   Patient denies blurred vision and double vision.  Ears/ Nose/ Throat:   Patient denies sore throat and sinus problems.  Hematologic/Lymphatic:   Patient denies swollen glands and easy bruising.  Cardiovascular:   Patient denies leg swelling and chest pains.  Respiratory:   Patient denies cough and shortness of breath.  Endocrine:   Patient denies excessive thirst.  Musculoskeletal:   Patient denies back pain and joint pain.  Neurological:   Patient denies headaches and dizziness.  Psychologic:   Patient denies depression and anxiety.   VITAL SIGNS: None   MULTI-SYSTEM PHYSICAL EXAMINATION:    Gastrointestinal: No mass, no tenderness, no rigidity, non obese abdomen. Patient has superficial right lower abdominal wound just above the inguinal crease on the right. There is no  evidence of fluctuance but there is some slight discharge which is somewhat malodorous     Complexity of Data:  Source Of History:  Patient  Records Review:   Previous Doctor Records, Previous Hospital Records, Previous Patient Records  Urine Test Review:   Urinalysis   PROCEDURES:          Urinalysis w/Scope Dipstick Dipstick Cont'd Micro  Color: Yellow Bilirubin: Neg mg/dL WBC/hpf: 0 - 5/hpf  Appearance: Clear Ketones: Neg mg/dL RBC/hpf: 20 - 40/hpf  Specific Gravity: 1.020 Blood: 3+ ery/uL Bacteria: Rare (0-9/hpf)  pH: 6.0 Protein: 1+ mg/dL Cystals: NS (Not Seen)  Glucose: 2+ mg/dL Urobilinogen: 0.2 mg/dL Casts: NS (Not Seen)    Nitrites: Neg Trichomonas: Not Present    Leukocyte Esterase: Neg leu/uL Mucous: Not Present      Epithelial Cells: 0 - 5/hpf      Yeast: NS (Not Seen)      Sperm: Not Present    ASSESSMENT:      ICD-10 Details  2 GU:   Hydronephrosis - F79.0 Acute, Complicated Injury  1 NON-GU:   Diffuse large B-cell lymphoma, lymph nodes of multiple sites - W40.97 Acute, Complicated Injury   PLAN:           Schedule Return Visit/Planned Activity: ASAP             Note: Referral to wound center  Return Notes: Eval/treat Right inguinal wound          Document Letter(s):  Created for Patient: Clinical Summary         Notes:   Recommend cystoscopy and retrograde for evaluation of the right ureter compression and make decision regarding possible removal or exchange of right JJ stent.  Will make referral to wound center for evaluation of right inguinal wound.

## 2021-10-26 NOTE — Anesthesia Preprocedure Evaluation (Addendum)
Anesthesia Evaluation  Patient identified by MRN, date of birth, ID band Patient awake    Reviewed: Allergy & Precautions, NPO status , Patient's Chart, lab work & pertinent test results  Airway Mallampati: II  TM Distance: >3 FB Neck ROM: Full    Dental  (+) Teeth Intact, Dental Advisory Given   Pulmonary sleep apnea and Continuous Positive Airway Pressure Ventilation ,    Pulmonary exam normal breath sounds clear to auscultation       Cardiovascular hypertension, Pt. on medications + CAD and +CHF (grade 1 diastolic dysfunction)  Normal cardiovascular exam+ dysrhythmias (eliquis) Atrial Fibrillation  Rhythm:Regular Rate:Normal  Echo 05/2021: 1. Left ventricular ejection fraction, by estimation, is 50 to 55%. The  left ventricle has low normal function. The left ventricle has no regional  wall motion abnormalities. There is mild left ventricular hypertrophy.  Left ventricular diastolic  parameters are consistent with Grade I diastolic dysfunction (impaired  relaxation). The average left ventricular global longitudinal strain is  -16.5 %. The global longitudinal strain is normal.  2. Right ventricular systolic function is normal. The right ventricular  size is normal. There is normal pulmonary artery systolic pressure.  3. Left atrial size was mildly dilated.  4. The mitral valve is normal in structure. No evidence of mitral valve  regurgitation. No evidence of mitral stenosis.  5. The aortic valve is calcified. There is moderate calcification of the  aortic valve. There is mild thickening of the aortic valve. Aortic valve  regurgitation is not visualized. Mild aortic valve sclerosis is present,  with no evidence of aortic valve  stenosis. Aortic valve mean gradient measures 7.0 mmHg. Aortic valve Vmax  measures 1.85 m/s.  6. The inferior vena cava is normal in size with greater than 50%  respiratory variability, suggesting  right atrial pressure of 3 mmHg.    Neuro/Psych negative neurological ROS  negative psych ROS   GI/Hepatic negative GI ROS, Neg liver ROS,   Endo/Other  diabetes, Well Controlled, Type 2, Oral Hypoglycemic AgentsObesity BMI 33 a1c 8.1  Renal/GU Renal disease (hydronephrosis )  negative genitourinary   Musculoskeletal negative musculoskeletal ROS (+)   Abdominal (+) + obese,   Peds  Hematology  (+) Blood dyscrasia, , Diffuse B cell lymphoma on chemo Chronic prednisone 20mg    Anesthesia Other Findings   Reproductive/Obstetrics negative OB ROS                            Anesthesia Physical Anesthesia Plan  ASA: 3  Anesthesia Plan: General   Post-op Pain Management: Tylenol PO (pre-op)   Induction: Intravenous  PONV Risk Score and Plan: Ondansetron, Dexamethasone, Midazolam and Treatment may vary due to age or medical condition  Airway Management Planned: LMA  Additional Equipment: None  Intra-op Plan:   Post-operative Plan: Extubation in OR  Informed Consent: I have reviewed the patients History and Physical, chart, labs and discussed the procedure including the risks, benefits and alternatives for the proposed anesthesia with the patient or authorized representative who has indicated his/her understanding and acceptance.     Dental advisory given  Plan Discussed with: CRNA  Anesthesia Plan Comments:        Anesthesia Quick Evaluation

## 2021-10-27 ENCOUNTER — Encounter (HOSPITAL_BASED_OUTPATIENT_CLINIC_OR_DEPARTMENT_OTHER)
Admission: RE | Disposition: A | Discharge status: Home / Self Care | Payer: Self-pay | Source: Ambulatory Visit | Attending: Urology

## 2021-10-27 ENCOUNTER — Ambulatory Visit (HOSPITAL_BASED_OUTPATIENT_CLINIC_OR_DEPARTMENT_OTHER): Payer: Medicare HMO | Admitting: Anesthesiology

## 2021-10-27 ENCOUNTER — Ambulatory Visit (HOSPITAL_BASED_OUTPATIENT_CLINIC_OR_DEPARTMENT_OTHER)
Admission: RE | Admit: 2021-10-27 | Discharge: 2021-10-27 | Disposition: A | Discharge status: Home / Self Care | Payer: Medicare HMO | Source: Ambulatory Visit | Attending: Urology | Admitting: Urology

## 2021-10-27 ENCOUNTER — Other Ambulatory Visit: Payer: Self-pay

## 2021-10-27 ENCOUNTER — Encounter (HOSPITAL_BASED_OUTPATIENT_CLINIC_OR_DEPARTMENT_OTHER): Payer: Self-pay | Admitting: Urology

## 2021-10-27 DIAGNOSIS — N1330 Unspecified hydronephrosis: Secondary | ICD-10-CM | POA: Insufficient documentation

## 2021-10-27 DIAGNOSIS — G4730 Sleep apnea, unspecified: Secondary | ICD-10-CM | POA: Diagnosis not present

## 2021-10-27 DIAGNOSIS — E119 Type 2 diabetes mellitus without complications: Secondary | ICD-10-CM | POA: Insufficient documentation

## 2021-10-27 DIAGNOSIS — Z7952 Long term (current) use of systemic steroids: Secondary | ICD-10-CM | POA: Diagnosis not present

## 2021-10-27 DIAGNOSIS — I110 Hypertensive heart disease with heart failure: Secondary | ICD-10-CM | POA: Insufficient documentation

## 2021-10-27 DIAGNOSIS — Z7984 Long term (current) use of oral hypoglycemic drugs: Secondary | ICD-10-CM | POA: Insufficient documentation

## 2021-10-27 DIAGNOSIS — I509 Heart failure, unspecified: Secondary | ICD-10-CM | POA: Insufficient documentation

## 2021-10-27 DIAGNOSIS — Z9221 Personal history of antineoplastic chemotherapy: Secondary | ICD-10-CM | POA: Diagnosis not present

## 2021-10-27 DIAGNOSIS — C8338 Diffuse large B-cell lymphoma, lymph nodes of multiple sites: Secondary | ICD-10-CM | POA: Diagnosis not present

## 2021-10-27 DIAGNOSIS — Z79899 Other long term (current) drug therapy: Secondary | ICD-10-CM | POA: Diagnosis not present

## 2021-10-27 DIAGNOSIS — I2510 Atherosclerotic heart disease of native coronary artery without angina pectoris: Secondary | ICD-10-CM | POA: Diagnosis not present

## 2021-10-27 HISTORY — PX: CYSTOSCOPY W/ URETERAL STENT PLACEMENT: SHX1429

## 2021-10-27 LAB — GLUCOSE, CAPILLARY: Glucose-Capillary: 151 mg/dL — ABNORMAL HIGH (ref 70–99)

## 2021-10-27 LAB — POCT I-STAT, CHEM 8
BUN: 19 mg/dL (ref 8–23)
Calcium, Ion: 1.24 mmol/L (ref 1.15–1.40)
Chloride: 104 mmol/L (ref 98–111)
Creatinine, Ser: 1.2 mg/dL (ref 0.61–1.24)
Glucose, Bld: 209 mg/dL — ABNORMAL HIGH (ref 70–99)
HCT: 27 % — ABNORMAL LOW (ref 39.0–52.0)
Hemoglobin: 9.2 g/dL — ABNORMAL LOW (ref 13.0–17.0)
Potassium: 4 mmol/L (ref 3.5–5.1)
Sodium: 140 mmol/L (ref 135–145)
TCO2: 24 mmol/L (ref 22–32)

## 2021-10-27 SURGERY — CYSTOSCOPY, WITH RETROGRADE PYELOGRAM AND URETERAL STENT INSERTION
Anesthesia: General | Site: Ureter | Laterality: Right

## 2021-10-27 MED ORDER — CEFAZOLIN SODIUM-DEXTROSE 2-3 GM-%(50ML) IV SOLR
INTRAVENOUS | Status: DC | PRN
  Administered 2021-10-27: 2 g via INTRAVENOUS

## 2021-10-27 MED ORDER — LIDOCAINE 2% (20 MG/ML) 5 ML SYRINGE
INTRAMUSCULAR | Status: DC | PRN
  Administered 2021-10-27: 60 mg via INTRAVENOUS

## 2021-10-27 MED ORDER — OXYCODONE HCL 5 MG/5ML PO SOLN: 5.0000 mg | Freq: Once | ORAL | Status: DC | PRN

## 2021-10-27 MED ORDER — PROPOFOL 10 MG/ML IV BOLUS
INTRAVENOUS | Status: DC | PRN
  Administered 2021-10-27 (×2): 30 mg via INTRAVENOUS
  Administered 2021-10-27: 100 mg via INTRAVENOUS

## 2021-10-27 MED ORDER — LACTATED RINGERS IV SOLN: INTRAVENOUS | Status: DC

## 2021-10-27 MED ORDER — INSULIN ASPART 100 UNIT/ML IJ SOLN
INTRAMUSCULAR | Status: AC
  Filled 2021-10-27: qty 1

## 2021-10-27 MED ORDER — FENTANYL CITRATE (PF) 100 MCG/2ML IJ SOLN
INTRAMUSCULAR | Status: AC
  Filled 2021-10-27: qty 2

## 2021-10-27 MED ORDER — SODIUM CHLORIDE 0.9 % IR SOLN
Status: DC | PRN
  Administered 2021-10-27: 3000 mL

## 2021-10-27 MED ORDER — ACETAMINOPHEN 500 MG PO TABS
1000.0000 mg | ORAL_TABLET | Freq: Once | ORAL | Status: AC
  Administered 2021-10-27: 1000 mg via ORAL

## 2021-10-27 MED ORDER — DEXAMETHASONE SODIUM PHOSPHATE 10 MG/ML IJ SOLN
INTRAMUSCULAR | Status: DC | PRN
  Administered 2021-10-27: 4 mg via INTRAVENOUS

## 2021-10-27 MED ORDER — OXYCODONE HCL 5 MG PO TABS: 5.0000 mg | ORAL_TABLET | Freq: Once | ORAL | Status: DC | PRN

## 2021-10-27 MED ORDER — ONDANSETRON HCL 4 MG/2ML IJ SOLN
INTRAMUSCULAR | Status: DC | PRN
  Administered 2021-10-27: 4 mg via INTRAVENOUS

## 2021-10-27 MED ORDER — LIDOCAINE 2% (20 MG/ML) 5 ML SYRINGE
INTRAMUSCULAR | Status: AC
  Filled 2021-10-27: qty 5

## 2021-10-27 MED ORDER — FENTANYL CITRATE (PF) 100 MCG/2ML IJ SOLN
INTRAMUSCULAR | Status: DC | PRN
  Administered 2021-10-27: 50 ug via INTRAVENOUS
  Administered 2021-10-27: 25 ug via INTRAVENOUS

## 2021-10-27 MED ORDER — DEXAMETHASONE SODIUM PHOSPHATE 10 MG/ML IJ SOLN
INTRAMUSCULAR | Status: AC
  Filled 2021-10-27: qty 1

## 2021-10-27 MED ORDER — ONDANSETRON HCL 4 MG/2ML IJ SOLN: 4.0000 mg | Freq: Once | INTRAMUSCULAR | Status: DC | PRN

## 2021-10-27 MED ORDER — FENTANYL CITRATE (PF) 100 MCG/2ML IJ SOLN: 25.0000 ug | INTRAMUSCULAR | Status: DC | PRN

## 2021-10-27 MED ORDER — IOHEXOL 300 MG/ML  SOLN
INTRAMUSCULAR | Status: DC | PRN
  Administered 2021-10-27: 10 mL

## 2021-10-27 MED ORDER — PHENYLEPHRINE 40 MCG/ML (10ML) SYRINGE FOR IV PUSH (FOR BLOOD PRESSURE SUPPORT)
PREFILLED_SYRINGE | INTRAVENOUS | Status: DC | PRN
  Administered 2021-10-27 (×2): 80 ug via INTRAVENOUS

## 2021-10-27 MED ORDER — ACETAMINOPHEN 500 MG PO TABS
ORAL_TABLET | ORAL | Status: AC
  Filled 2021-10-27: qty 2

## 2021-10-27 MED ORDER — INSULIN ASPART 100 UNIT/ML IJ SOLN
5.0000 [IU] | Freq: Once | INTRAMUSCULAR | Status: AC
  Administered 2021-10-27: 5 [IU] via SUBCUTANEOUS

## 2021-10-27 MED ORDER — ONDANSETRON HCL 4 MG/2ML IJ SOLN
INTRAMUSCULAR | Status: AC
  Filled 2021-10-27: qty 2

## 2021-10-27 SURGICAL SUPPLY — 25 items
BAG DRAIN URO-CYSTO SKYTR STRL (DRAIN) ×3 IMPLANT
BAG DRN UROCATH (DRAIN) ×1
BULB IRRIG PATHFIND (MISCELLANEOUS) IMPLANT
CATH URET 5FR 28IN CONE TIP (BALLOONS) ×2
CATH URET 5FR 28IN OPEN ENDED (CATHETERS) IMPLANT
CATH URET 5FR 70CM CONE TIP (BALLOONS) IMPLANT
CLOTH BEACON ORANGE TIMEOUT ST (SAFETY) ×3 IMPLANT
ELECT REM PT RETURN 9FT ADLT (ELECTROSURGICAL)
ELECTRODE REM PT RTRN 9FT ADLT (ELECTROSURGICAL) IMPLANT
FIBER LASER FLEXIVA 365 (UROLOGICAL SUPPLIES) IMPLANT
GLOVE SURG ENC MOIS LTX SZ7.5 (GLOVE) ×3 IMPLANT
GOWN STRL REUS W/TWL LRG LVL3 (GOWN DISPOSABLE) ×3 IMPLANT
GUIDEWIRE ANG ZIPWIRE 038X150 (WIRE) IMPLANT
GUIDEWIRE STR DUAL SENSOR (WIRE) IMPLANT
IV NS IRRIG 3000ML ARTHROMATIC (IV SOLUTION) ×3 IMPLANT
KIT TURNOVER CYSTO (KITS) ×3 IMPLANT
MANIFOLD NEPTUNE II (INSTRUMENTS) IMPLANT
PACK CYSTO (CUSTOM PROCEDURE TRAY) ×3 IMPLANT
SYR 20ML LL LF (SYRINGE) ×3 IMPLANT
SYR TOOMEY IRRIG 70ML (MISCELLANEOUS)
SYRINGE TOOMEY IRRIG 70ML (MISCELLANEOUS) IMPLANT
TRACTIP FLEXIVA PULS ID 200XHI (Laser) IMPLANT
TRACTIP FLEXIVA PULSE ID 200 (Laser)
TUBE CONNECTING 12'X1/4 (SUCTIONS)
TUBE CONNECTING 12X1/4 (SUCTIONS) IMPLANT

## 2021-10-27 NOTE — Interval H&P Note (Signed)
History and Physical Interval Note:  10/27/2021 9:34 AM  [name]  has presented today for surgery, with the diagnosis of HYDRONEPHROSIS.  The various methods of treatment have been discussed with the patient and family. After consideration of risks, benefits and other options for treatment, the patient has consented to  Procedure(s): CYSTOSCOPY WITH RETROGRADE PYELOGRAM/POSSIBLE URETERAL STENT REMOVAL VERSUS EXCHANGE (Right) as a surgical intervention.  The patient's history has been reviewed, patient examined, no change in status, stable for surgery.  I have reviewed the patient's chart and labs.  Questions were answered to the patient's satisfaction.     [name]

## 2021-10-27 NOTE — Discharge Instructions (Signed)
CYSTOSCOPY HOME CARE INSTRUCTIONS  Activity: Rest for the remainder of the day.  Do not drive or operate equipment today.  You may resume normal activities in one to two days as instructed by your physician.   Meals: Drink plenty of liquids and eat light foods such as gelatin or soup this evening.  You may return to a normal meal plan tomorrow.  Return to Work: You may return to work in one to two days or as instructed by your physician.  Special Instructions / Symptoms: Call your physician if any of these symptoms occur:   -persistent or heavy bleeding  -bleeding which continues after first few urination  -large blood clots that are difficult to pass  -urine stream diminishes or stops completely  -fever equal to or higher than 101 degrees Farenheit.  -cloudy urine with a strong, foul odor  -severe pain   Post Anesthesia Home Care Instructions  Activity: Get plenty of rest for the remainder of the day. A responsible adult should stay with you for 24 hours following the procedure.  For the next 24 hours, DO NOT: -Drive a car -Paediatric nurse -Drink alcoholic beverages -Take any medication unless instructed by your physician -Make any legal decisions or sign important papers.  Meals: Start with liquid foods such as gelatin or soup. Progress to regular foods as tolerated. Avoid greasy, spicy, heavy foods. If nausea and/or vomiting occur, drink only clear liquids until the nausea and/or vomiting subsides. Call your physician if vomiting continues.  Special Instructions/Symptoms: Your throat may feel dry or sore from the anesthesia or the breathing tube placed in your throat during surgery. If this causes discomfort, gargle with warm salt water. The discomfort should disappear within 24 hours.  If you had a scopolamine patch placed behind your ear for the management of post- operative nausea and/or vomiting:  1. The medication in the patch is effective for 72 hours, after which  it should be removed.  Wrap patch in a tissue and discard in the trash. Wash hands thoroughly with soap and water. 2. You may remove the patch earlier than 72 hours if you experience unpleasant side effects which may include dry mouth, dizziness or visual disturbances. 3. Avoid touching the patch. Wash your hands with soap and water after contact with the patch.    You may feel some burning pain when you urinate.  This should disappear with time.  Applying moist heat to the lower abdomen or a hot tub bath may help relieve the pain. \

## 2021-10-27 NOTE — Op Note (Signed)
Preoperative diagnosis:  1.  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  Same  Procedure(s): 1.  Cystoscopy, bilateral retrograde pyelogram with intraoperative interpretation, removal of right JJ stent  Surgeon: Dr. [name]  Anesthesia: General  Complications: None  EBL: minimal  Specimens: None   Disposition of specimens: Not applicable  Intraoperative findings: Mild narrowing of right upper ureter but patent and contrast easily flowed from pelvis down to bladder, left ureter and kidney normal  Indication: Patient is a 75 year old with history of B-cell lymphoma and associated right-sided hydronephrosis secondary to retroperitoneal lymph node mass.  Patient has finished up chemotherapy with significant shrinkage of the retroperitoneal mass.  Presents at this time undergo cystoscopy and retrograde pyelogram possible removal possible exchange of right JJ stent.  Description of procedure:  After obtaining informed consent for the patient is taken later cystoscopy suite placed under general anesthesia.  Placed in dorsolithotomy position genitalia prepped draped in usual sterile fashion.  Proper pause timeout was performed prior to procedure.  14 [place] the scope was advanced in the bladder without difficulty.  Patient right JJ stent was identified and removed utilizing alligator graspers.  Utilizing 8 French cone-tip catheter retrograde pyelograms performed on the right side revealed prompt filling of the right ureter.  There was no significant hydronephrosis there was mild narrowing in the right proximal ureter but contrast flowed briskly from renal pelvis down to the bladder after removal of the retrograde catheter.  Retrograde pyelogram similarly performed on the left side revealed no hydronephrosis and good drainage of the left ureter upon removal of the retrograde catheter.  As there is no delay in excretion of contrast the stent was left out.   Bladder was emptied procedure terminated.  He was awakened from anesthesia and taken back to recovery in stable condition.  No immediate complication from the procedure

## 2021-10-27 NOTE — Anesthesia Procedure Notes (Signed)
Procedure Name: LMA Insertion Date/Time: 10/27/2021 11:15 AM Performed by: Genelle Bal, CRNA Pre-anesthesia Checklist: Patient identified, Emergency Drugs available, Suction available and Patient being monitored Patient Re-evaluated:Patient Re-evaluated prior to induction Oxygen Delivery Method: Circle system utilized Preoxygenation: Pre-oxygenation with 100% oxygen Induction Type: IV induction Ventilation: Mask ventilation without difficulty LMA: LMA inserted LMA Size: 5.0 Number of attempts: 1 Airway Equipment and Method: Bite block Placement Confirmation: positive ETCO2 Tube secured with: Tape Dental Injury: Teeth and Oropharynx as per pre-operative assessment

## 2021-10-27 NOTE — Anesthesia Postprocedure Evaluation (Signed)
Anesthesia Post Note  Patient: LAWSON MAHONE  Procedure(s) Performed: CYSTOSCOPY WITH RETROGRADE PYELOGRAM/ URETERAL STENT REMOVAL (Right: Ureter)     Patient location during evaluation: PACU Anesthesia Type: General Level of consciousness: awake and alert, oriented and patient cooperative Pain management: pain level controlled Vital Signs Assessment: post-procedure vital signs reviewed and stable Respiratory status: spontaneous breathing, nonlabored ventilation and respiratory function stable Cardiovascular status: blood pressure returned to baseline and stable Postop Assessment: no apparent nausea or vomiting Anesthetic complications: no   No notable events documented.  Last Vitals:  Vitals:   10/27/21 0947 10/27/21 1146  BP: (!) 141/78 126/74  Pulse: 66 61  Resp: 18 19  Temp: 37.1 C (!) 36.3 C  SpO2: 100% 99%    Last Pain:  Vitals:   10/27/21 1146  PainSc: [place]

## 2021-10-27 NOTE — Transfer of Care (Signed)
Immediate Anesthesia Transfer of Care Note  Patient: [name]  Procedure(s) Performed: CYSTOSCOPY WITH RETROGRADE PYELOGRAM/ URETERAL STENT REMOVAL (Right: Ureter)  Patient Location: PACU  Anesthesia Type:General  Level of Consciousness: drowsy and patient cooperative  Airway & Oxygen Therapy: Patient Spontanous Breathing and Patient connected to face mask oxygen  Post-op Assessment: Report given to RN and Post -op Vital signs reviewed and stable  Post vital signs: Reviewed and stable  Last Vitals:  Vitals Value Taken Time  BP    Temp    Pulse 61 10/27/21 1147  Resp 19 10/27/21 1147  SpO2 99 % 10/27/21 1147  Vitals shown include unvalidated device data.  Last Pain:  Vitals:   10/27/21 0947  PainSc: 0-No pain      Patients Stated Pain Goal: 4 (42/39/53 2023)  Complications: No notable events documented.

## 2021-10-28 ENCOUNTER — Encounter (HOSPITAL_BASED_OUTPATIENT_CLINIC_OR_DEPARTMENT_OTHER): Payer: Self-pay | Admitting: Urology

## 2021-10-28 ENCOUNTER — Encounter (HOSPITAL_BASED_OUTPATIENT_CLINIC_OR_DEPARTMENT_OTHER): Payer: Medicare HMO | Admitting: Internal Medicine

## 2021-10-28 ENCOUNTER — Other Ambulatory Visit: Payer: Medicare HMO

## 2021-10-28 ENCOUNTER — Ambulatory Visit: Payer: Medicare HMO | Admitting: Physician Assistant

## 2021-10-28 DIAGNOSIS — I87313 Chronic venous hypertension (idiopathic) with ulcer of bilateral lower extremity: Secondary | ICD-10-CM | POA: Diagnosis not present

## 2021-10-28 NOTE — Progress Notes (Signed)
[name], [name] ([id number]) Visit Report for 10/28/2021 Arrival Information Details Patient Name: Date of Service: [name] TO [name] 10/28/2021 8:30 [name] M Medical Record Number: [id number] Patient Account Number: [id number] Date of Birth/Sex: Treating RN: 04-16-1946 (75 y.o. [name] Primary Care Youssouf Shipley: [name] Other Clinician: Referring Keelan Pomerleau: Treating Kristianne Albin/Extender: Edgardo Roys in Treatment: 2 Visit Information History Since Last Visit Added or deleted any medications: No Patient Arrived: Cane Any new allergies or adverse reactions: No Arrival Time: 08:48 Had [name] fall or experienced change in No Accompanied By: interpreter, wife activities of daily living that may affect Transfer Assistance: None risk of falls: Patient Identification Verified: Yes Signs or symptoms of abuse/neglect since last visito No Secondary Verification Process Completed: Yes Hospitalized since last visit: No Patient Requires Transmission-Based Precautions: No Implantable device outside of the clinic excluding No Patient Has Alerts: Yes cellular tissue based products placed in the center Patient Alerts: Patient on Blood Thinner since last visit: Has Dressing in Place as Prescribed: Yes Has Compression in Place as Prescribed: Yes Pain Present Now: No Electronic Signature(s) Signed: 10/28/2021 5:40:46 PM By: Levan Hurst RN, BSN Entered By: Levan Hurst on 10/28/2021 08:51:51 -------------------------------------------------------------------------------- Compression Therapy Details Patient Name: Date of Service: [name] TO Tonye Royalty S. 10/28/2021 8:30 [name] M Medical Record Number: [id number] Patient Account Number: [id number] Date of Birth/Sex: Treating RN: Nov 30, 1945 (75 y.o. [name] Primary Care Besnik Febus: [name] Other Clinician: Referring Mickenzie Stolar: Treating Brilyn Tuller/Extender: Edgardo Roys in Treatment: 2 Compression Therapy Performed for Wound Assessment: Wound #1 Right,Anterior Lower Leg Performed By: Clinician Levan Hurst, RN Compression Type: Double Layer Post Procedure Diagnosis Same as Pre-procedure Electronic Signature(s) Signed: 10/28/2021 5:40:46 PM By: Levan Hurst RN, BSN Entered By: Levan Hurst on 10/28/2021 09:49:11 -------------------------------------------------------------------------------- Compression Therapy Details Patient Name: Date of Service: [name] TO [place] S. 10/28/2021 8:30 [name] M Medical Record Number: [id number] Patient Account Number: [id number] Date of Birth/Sex: Treating RN: 04/13/1946 (75 y.o. [name] Primary Care Byron Peacock: [name] Other Clinician: Referring Arayna Illescas: Treating Kamsiyochukwu Buist/Extender: Edgardo Roys in Treatment: 2 Compression Therapy Performed for Wound Assessment: Wound #2 Left,Posterior Lower Leg Performed By: Clinician Levan Hurst, RN Compression Type: Double Layer Post Procedure Diagnosis Same as Pre-procedure Electronic Signature(s) Signed: 10/28/2021 5:40:46 PM By: Levan Hurst RN, BSN Entered By: Levan Hurst on 10/28/2021 09:49:11 -------------------------------------------------------------------------------- Encounter Discharge Information Details Patient Name: Date of Service: [name], [name] TO [place] S. 10/28/2021 8:30 [name] M Medical Record Number: [id number] Patient Account Number: [id number] Date of Birth/Sex: Treating RN: Aug 25, 1946 (75 y.o. [name] Primary Care Joey Lierman: [name] Other Clinician: Referring Cantrell Martus: Treating Snyder Colavito/Extender: Edgardo Roys in Treatment: 2 Encounter Discharge Information Items Discharge Condition: Stable Ambulatory Status: Cane Discharge Destination: Home Transportation: Private Auto Accompanied By: interpreter, wife Schedule Follow-up  Appointment: Yes Clinical Summary of Care: Patient Declined Electronic Signature(s) Signed: 10/28/2021 5:40:46 PM By: Levan Hurst RN, BSN Entered By: Levan Hurst on 10/28/2021 17:02:11 -------------------------------------------------------------------------------- Lower Extremity Assessment Details Patient Name: Date of Service: [name], [name] TO [place] S. 10/28/2021 8:30 [name] M Medical Record Number: [id number] Patient Account Number: [id number] Date of Birth/Sex: Treating RN: 15-Mar-1946 (75 y.o. [name] Primary Care Olga Bourbeau: [name] Other Clinician: Referring Docia Klar: Treating Cortney Beissel/Extender: Edgardo Roys in Treatment: 2 Edema Assessment Assessed: [Left: No] [Right: No] Edema: [Left: Yes] [Right: Yes] Calf Left: Right: Point of Measurement:  30 cm From Medial Instep 38 cm 37 cm Ankle Left: Right: Point of Measurement: 11 cm From Medial Instep 27.5 cm 27 cm Vascular Assessment Pulses: Dorsalis Pedis Palpable: [Left:Yes] [Right:Yes] Electronic Signature(s) Signed: 10/28/2021 5:40:46 PM By: Levan Hurst RN, BSN Entered By: Levan Hurst on 10/28/2021 09:08:10 -------------------------------------------------------------------------------- Multi Wound Chart Details Patient Name: Date of Service: [name], [name] TO Tonye Royalty S. 10/28/2021 8:30 [name] M Medical Record Number: [id number] Patient Account Number: [id number] Date of Birth/Sex: Treating RN: [birth year] (75 y.o. [name] Primary Care Taquisha Phung: [name] Other Clinician: Referring Tristian Bouska: Treating Yamil Dougher/Extender: Edgardo Roys in Treatment: 2 Vital Signs Height(in): Pulse(bpm): 79 Weight(lbs): Blood Pressure(mmHg): 112/51 Body Mass Index(BMI): Temperature(F): 97.8 Respiratory Rate(breaths/min): 18 Photos: [1:No Photos Right, Anterior Lower Leg] [2:No Photos Left, Posterior Lower Leg] [3:No Photos Right  Groin] Wound Location: [1:Gradually Appeared] [2:Gradually Appeared] [3:Gradually Appeared] Wounding Event: [1:Venous Leg Ulcer] [2:Venous Leg Ulcer] [3:Atypical] Primary Etiology: [1:Sleep Apnea, Arrhythmia, Coronary Sleep Apnea, Arrhythmia, Coronary Sleep Apnea, Arrhythmia, Coronary] Comorbid History: [1:Artery Disease, Hypertension, Type II Artery Disease, Hypertension, Type II Artery Disease, Hypertension, Type II Diabetes, Received Chemotherapy Diabetes, Received Chemotherapy Diabetes, Received Chemotherapy 07/16/2021]  [2:09/28/2021] [3:07/16/2021] Date Acquired: [1:2] [2:2] [3:2] Weeks of Treatment: [1:Open] [2:Open] [3:Open] Wound Status: [1:No] [2:Yes] [3:Yes] Clustered Wound: [1:N/[name]] [2:4] [3:3] Clustered Quantity: [1:1x0.8x0.1] [2:4x0.8x0.1] [3:0x0x0] Measurements L x W x D (cm) [1:0.628] [2:2.513] [3:0] [name] (cm) : rea [1:0.063] [2:0.251] [3:0] Volume (cm) : [1:33.30%] [2:-1.60%] [3:100.00%] % Reduction in Area: [1:33.00%] [2:-1.60%] [3:100.00%] % Reduction in Volume: [1:Full Thickness Without Exposed] [2:Full Thickness Without Exposed] [3:Full Thickness Without Exposed] Classification: [1:Support Structures Medium] [2:Support Structures Medium] [3:Support Structures None Present] Exudate Amount: [1:Serosanguineous] [2:Serosanguineous] [3:N/[name]] Exudate Type: [1:red, brown] [2:red, brown] [3:N/[name]] Exudate Color: [1:Flat and Intact] [2:Flat and Intact] [3:Flat and Intact] Wound Margin: [1:Large (67-100%)] [2:Small (1-33%)] [3:None Present (0%)] Granulation Amount: [1:Pink] [2:Pink] [3:N/[name]] Granulation Quality: [1:Small (1-33%)] [2:Large (67-100%)] [3:None Present (0%)] Necrotic Amount: [1:Fat Layer (Subcutaneous Tissue): Yes Fat Layer (Subcutaneous Tissue): Yes Fascia: No] Exposed Structures: [1:Fascia: No Tendon: No Muscle: No Joint: No Bone: No Small (1-33%)] [2:Fascia: No Tendon: No Muscle: No Joint: No Bone: No Small (1-33%)] [3:Fat Layer (Subcutaneous Tissue): No Tendon: No Muscle:  No Joint: No Bone: No Large (67-100%)] Wound Number: 4 5 N/[name] Photos: No Photos No Photos N/[name] Right, Lateral Abdomen - Lower Left, Proximal, Medial Lower Leg N/[name] Wound Location: Quadrant Gradually Appeared Blister N/[name] Wounding Event: Atypical Venous Leg Ulcer N/[name] Primary Etiology: Sleep Apnea, Arrhythmia, Coronary Sleep Apnea, Arrhythmia, Coronary N/[name] Comorbid History: Artery Disease, Hypertension, Type II Artery Disease, Hypertension, Type II Diabetes, Received Chemotherapy Diabetes, Received Chemotherapy 10/20/2021 10/20/2021 N/[name] Date Acquired: 1 1 N/[name] Weeks of Treatment: Open Open N/[name] Wound Status: No No N/[name] Clustered Wound: N/[name] N/[name] N/[name] Clustered Quantity: 0x0x0 0x0x0 N/[name] Measurements L x W x D (cm) 0 0 N/[name] [name] (cm) : rea 0 0 N/[name] Volume (cm) : 100.00% 100.00% N/[name] % Reduction in Area: 100.00% 100.00% N/[name] % Reduction in Volume: Full Thickness Without Exposed Full Thickness Without Exposed N/[name] Classification: Support Structures Support Structures None Present None Present N/[name] Exudate Amount: N/[name] N/[name] N/[name] Exudate Type: N/[name] N/[name] N/[name] Exudate Color: Flat and Intact Flat and Intact N/[name] Wound Margin: None Present (0%) None Present (0%) N/[name] Granulation Amount: N/[name] N/[name] N/[name] Granulation Quality: None Present (0%) None Present (0%) N/[name] Necrotic Amount: Fascia: No Fascia: No N/[name] Exposed Structures: Fat Layer (Subcutaneous Tissue): No Fat Layer (Subcutaneous Tissue): No Tendon: No  Tendon: No Muscle: No Muscle: No Joint: No Joint: No Bone: No Bone: No Large (67-100%) Large (67-100%) N/[name] Epithelialization: Treatment Notes Electronic Signature(s) Signed: 10/28/2021 4:51:07 PM By: Linton Ham MD Signed: 10/28/2021 5:40:46 PM By: Levan Hurst RN, BSN Entered By: Linton Ham on 10/28/2021 09:43:30 -------------------------------------------------------------------------------- Multi-Disciplinary Care Plan Details Patient Name: Date of Service: [name], [name]  [name] TO [name]. 10/28/2021 8:30 [name] M Medical Record Number: [id number] Patient Account Number: [id number] Date of Birth/Sex: Treating RN: [birth year] (76 y.o. [name] Primary Care Naveena Eyman: [name] Other Clinician: Referring Anthonny Schiller: Treating Silena Wyss/Extender: Edgardo Roys in Treatment: 2 Multidisciplinary Care Plan reviewed with physician Active Inactive Abuse / Safety / Falls / Self Care Management Nursing Diagnoses: History of Falls Potential for falls Potential for injury related to falls Goals: Patient will not experience any injury related to falls Date Initiated: 10/13/2021 Target Resolution Date: 11/13/2021 Goal Status: Active Patient/caregiver will verbalize/demonstrate measures taken to prevent injury and/or falls Date Initiated: 10/13/2021 Target Resolution Date: 11/13/2021 Goal Status: Active Interventions: Assess Activities of Daily Living upon admission and as needed Assess fall risk on admission and as needed Assess: immobility, friction, shearing, incontinence upon admission and as needed Assess impairment of mobility on admission and as needed per policy Assess personal safety and home safety (as indicated) on admission and as needed Assess self care needs on admission and as needed Provide education on fall prevention Provide education on personal and home safety Notes: Nutrition Nursing Diagnoses: Impaired glucose control: actual or potential Potential for alteratiion in Nutrition/Potential for imbalanced nutrition Goals: Patient/caregiver agrees to and verbalizes understanding of need to use nutritional supplements and/or vitamins as prescribed Date Initiated: 10/13/2021 Target Resolution Date: 11/13/2021 Goal Status: Active Patient/caregiver will maintain therapeutic glucose control Date Initiated: 10/13/2021 Target Resolution Date: 11/13/2021 Goal Status: Active Interventions: Assess HgA1c  results as ordered upon admission and as needed Assess patient nutrition upon admission and as needed per policy Provide education on elevated blood sugars and impact on wound healing Provide education on nutrition Treatment Activities: Education provided on Nutrition : 10/13/2021 Notes: Wound/Skin Impairment Nursing Diagnoses: Impaired tissue integrity Knowledge deficit related to ulceration/compromised skin integrity Goals: Patient/caregiver will verbalize understanding of skin care regimen Date Initiated: 10/13/2021 Target Resolution Date: 11/13/2021 Goal Status: Active Interventions: Assess patient/caregiver ability to obtain necessary supplies Assess patient/caregiver ability to perform ulcer/skin care regimen upon admission and as needed Assess ulceration(s) every visit Provide education on ulcer and skin care Notes: Electronic Signature(s) Signed: 10/28/2021 5:40:46 PM By: Levan Hurst RN, BSN Entered By: Levan Hurst on 10/28/2021 09:12:22 -------------------------------------------------------------------------------- Pain Assessment Details Patient Name: Date of Service: [name], [name] TO Tonye Royalty S. 10/28/2021 8:30 [name] M Medical Record Number: [id number] Patient Account Number: [id number] Date of Birth/Sex: Treating RN: 1946/08/18 (75 y.o. [name] Primary Care Lithzy Bernard: [name] Other Clinician: Referring Joanthony Hamza: Treating Khylah Kendra/Extender: Edgardo Roys in Treatment: 2 Active Problems Location of Pain Severity and Description of Pain Patient Has Paino No Site Locations Pain Management and Medication Current Pain Management: Electronic Signature(s) Signed: 10/28/2021 5:40:46 PM By: Levan Hurst RN, BSN Entered By: Levan Hurst on 10/28/2021 08:54:49 -------------------------------------------------------------------------------- Patient/Caregiver Education Details Patient Name: Date of Service: [name] TO [name] 12/14/2022andnbsp8:30 [place] Record Number: [id number] Patient Account Number: [id number] Date of Birth/Gender: Treating RN: 07-16-1946 (75 y.o. [name] Primary Care Physician: [name] Other Clinician: Referring Physician: Treating Physician/Extender: Edgardo Roys in Treatment:  2 Education Assessment Education Provided To: Patient Education Topics Provided Wound/Skin Impairment: Methods: Explain/Verbal Responses: State content correctly Electronic Signature(s) Signed: 10/28/2021 5:40:46 PM By: Levan Hurst RN, BSN Entered By: Levan Hurst on 10/28/2021 09:13:13 -------------------------------------------------------------------------------- Wound Assessment Details Patient Name: Date of Service: [name], [name] TO [place] S. 10/28/2021 8:30 [name] M Medical Record Number: [id number] Patient Account Number: [id number] Date of Birth/Sex: Treating RN: Mar 26, 1946 (75 y.o. [name] Primary Care Gaius Ishaq: [name] Other Clinician: Referring Tameia Rafferty: Treating Zaul Hubers/Extender: Edgardo Roys in Treatment: 2 Wound Status Wound Number: 1 Primary Venous Leg Ulcer Etiology: Wound Location: Right, Anterior Lower Leg Wound Open Wounding Event: Gradually Appeared Status: Date Acquired: 07/16/2021 Comorbid Sleep Apnea, Arrhythmia, Coronary Artery Disease, Hypertension, Weeks Of Treatment: 2 History: Type II Diabetes, Received Chemotherapy Clustered Wound: No Wound Measurements Length: (cm) 1 Width: (cm) 0.8 Depth: (cm) 0.1 Area: (cm) 0.628 Volume: (cm) 0.063 % Reduction in Area: 33.3% % Reduction in Volume: 33% Epithelialization: Small (1-33%) Tunneling: No Undermining: No Wound Description Classification: Full Thickness Without Exposed Support Structures Wound Margin: Flat and Intact Exudate Amount: Medium Exudate Type: Serosanguineous Exudate Color: red,  brown Foul Odor After Cleansing: No Slough/Fibrino Yes Wound Bed Granulation Amount: Large (67-100%) Exposed Structure Granulation Quality: Pink Fascia Exposed: No Necrotic Amount: Small (1-33%) Fat Layer (Subcutaneous Tissue) Exposed: Yes Necrotic Quality: Adherent Slough Tendon Exposed: No Muscle Exposed: No Joint Exposed: No Bone Exposed: No Treatment Notes Wound #1 (Lower Leg) Wound Laterality: Right, Anterior Cleanser Soap and Water Discharge Instruction: May shower and wash wound with dial antibacterial soap and water prior to dressing change. Peri-Wound Care Sween Lotion (Moisturizing lotion) Discharge Instruction: Apply moisturizing lotion as directed Topical Mupirocin Ointment Discharge Instruction: Apply Mupirocin (Bactroban) directly to wound bed, under alginate Primary Dressing KerraCel Ag Gelling Fiber Dressing, 2x2 in (silver alginate) Discharge Instruction: Apply silver alginate to wound bed as instructed Secondary Dressing Woven Gauze Sponge, Non-Sterile 4x4 in Discharge Instruction: Apply over primary dressing as directed. Secured With Compression Wrap CoFlex TLC XL 2-layer Compression System 4x7 (in/yd) Discharge Instruction: Apply CoFlex 2-layer compression as directed. (alt for 4 layer) Compression Stockings Add-Ons Electronic Signature(s) Signed: 10/28/2021 5:40:46 PM By: Levan Hurst RN, BSN Entered By: Levan Hurst on 10/28/2021 09:09:39 -------------------------------------------------------------------------------- Wound Assessment Details Patient Name: Date of Service: [name], [name] TO [place] S. 10/28/2021 8:30 [name] M Medical Record Number: [id number] Patient Account Number: [id number] Date of Birth/Sex: Treating RN: [birth year] (75 y.o. [name] Primary Care Bond Grieshop: [name] Other Clinician: Referring Maverick Dieudonne: Treating Tiffancy Moger/Extender: Edgardo Roys in Treatment: 2 Wound Status Wound  Number: 2 Primary Venous Leg Ulcer Etiology: Wound Location: Left, Posterior Lower Leg Wound Open Wounding Event: Gradually Appeared Status: Date Acquired: 09/28/2021 Comorbid Sleep Apnea, Arrhythmia, Coronary Artery Disease, Hypertension, Weeks Of Treatment: 2 History: Type II Diabetes, Received Chemotherapy Clustered Wound: Yes Wound Measurements Length: (cm) 4 Width: (cm) 0.8 Depth: (cm) 0.1 Clustered Quantity: 4 Area: (cm) 2.513 Volume: (cm) 0.251 % Reduction in Area: -1.6% % Reduction in Volume: -1.6% Epithelialization: Small (1-33%) Tunneling: No Undermining: No Wound Description Classification: Full Thickness Without Exposed Support Structures Wound Margin: Flat and Intact Exudate Amount: Medium Exudate Type: Serosanguineous Exudate Color: red, brown Foul Odor After Cleansing: No Slough/Fibrino Yes Wound Bed Granulation Amount: Small (1-33%) Exposed Structure Granulation Quality: Pink Fascia Exposed: No Necrotic Amount: Large (67-100%) Fat Layer (Subcutaneous Tissue) Exposed: Yes Necrotic Quality: Adherent Slough Tendon Exposed: No Muscle Exposed: No Joint Exposed: No Bone Exposed:  No Treatment Notes Wound #2 (Lower Leg) Wound Laterality: Left, Posterior Cleanser Soap and Water Discharge Instruction: May shower and wash wound with dial antibacterial soap and water prior to dressing change. Peri-Wound Care Sween Lotion (Moisturizing lotion) Discharge Instruction: Apply moisturizing lotion as directed Topical Mupirocin Ointment Discharge Instruction: Apply Mupirocin (Bactroban) directly to wound bed, under alginate Primary Dressing KerraCel Ag Gelling Fiber Dressing, 2x2 in (silver alginate) Discharge Instruction: Apply silver alginate to wound bed as instructed Secondary Dressing Woven Gauze Sponge, Non-Sterile 4x4 in Discharge Instruction: Apply over primary dressing as directed. ABD Pad, 8x10 Discharge Instruction: Apply over primary dressing as  directed. Secured With Compression Wrap CoFlex TLC XL 2-layer Compression System 4x7 (in/yd) Discharge Instruction: Apply CoFlex 2-layer compression as directed. (alt for 4 layer) Compression Stockings Add-Ons Electronic Signature(s) Signed: 10/28/2021 5:40:46 PM By: Levan Hurst RN, BSN Entered By: Levan Hurst on 10/28/2021 09:09:54 -------------------------------------------------------------------------------- Wound Assessment Details Patient Name: Date of Service: [name], [name] TO [place] S. 10/28/2021 8:30 [name] M Medical Record Number: [id number] Patient Account Number: [id number] Date of Birth/Sex: Treating RN: [birth year] (75 y.o. [name] Primary Care Fruma [place]: [name] Other Clinician: Referring Darryn Kydd: Treating Rafay Dahan/Extender: Edgardo Roys in Treatment: 2 Wound Status Wound Number: 3 Primary Atypical Etiology: Wound Location: Right Groin Wound Open Wounding Event: Gradually Appeared Status: Date Acquired: 07/16/2021 Comorbid Sleep Apnea, Arrhythmia, Coronary Artery Disease, Hypertension, Weeks Of Treatment: 2 History: Type II Diabetes, Received Chemotherapy Clustered Wound: Yes Wound Measurements Length: (cm) Width: (cm) Depth: (cm) Clustered Quantity: Area: (cm) Volume: (cm) 0 % Reduction in Area: 100% 0 % Reduction in Volume: 100% 0 Epithelialization: Large (67-100%) 3 Tunneling: No 0 Undermining: No 0 Wound Description Classification: Full Thickness Without Exposed Support Structures Wound Margin: Flat and Intact Exudate Amount: None Present Foul Odor After Cleansing: No Slough/Fibrino No Wound Bed Granulation Amount: None Present (0%) Exposed Structure Necrotic Amount: None Present (0%) Fascia Exposed: No Fat Layer (Subcutaneous Tissue) Exposed: No Tendon Exposed: No Muscle Exposed: No Joint Exposed: No Bone Exposed: No Electronic Signature(s) Signed: 10/28/2021 5:40:46 PM By: Levan Hurst RN, BSN Entered By: Levan Hurst on 10/28/2021 09:10:10 -------------------------------------------------------------------------------- Wound Assessment Details Patient Name: Date of Service: [name], [name] TO Tonye Royalty S. 10/28/2021 8:30 [name] M Medical Record Number: [id number] Patient Account Number: [id number] Date of Birth/Sex: Treating RN: 06/22/1946 (75 y.o. [name] Primary Care Baine Decesare: [name] Other Clinician: Referring Kaelea Gathright: Treating Johntae Broxterman/Extender: Edgardo Roys in Treatment: 2 Wound Status Wound Number: 4 Primary Atypical Etiology: Wound Location: Right, Lateral Abdomen - Lower Quadrant Wound Open Wounding Event: Gradually Appeared Status: Date Acquired: 10/20/2021 Comorbid Sleep Apnea, Arrhythmia, Coronary Artery Disease, Hypertension, Weeks Of Treatment: 1 History: Type II Diabetes, Received Chemotherapy Clustered Wound: No Wound Measurements Length: (cm) Width: (cm) Depth: (cm) Area: (cm) Volume: (cm) 0 % Reduction in Area: 100% 0 % Reduction in Volume: 100% 0 Epithelialization: Large (67-100%) 0 Tunneling: No 0 Undermining: No Wound Description Classification: Full Thickness Without Exposed Support Structures Wound Margin: Flat and Intact Exudate Amount: None Present Foul Odor After Cleansing: No Slough/Fibrino No Wound Bed Granulation Amount: None Present (0%) Exposed Structure Necrotic Amount: None Present (0%) Fascia Exposed: No Fat Layer (Subcutaneous Tissue) Exposed: No Tendon Exposed: No Muscle Exposed: No Joint Exposed: No Bone Exposed: No Electronic Signature(s) Signed: 10/28/2021 5:40:46 PM By: Levan Hurst RN, BSN Entered By: Levan Hurst on 10/28/2021 09:11:09 -------------------------------------------------------------------------------- Wound Assessment Details Patient Name: Date of Service: [name], [name]  TO LIY S. 10/28/2021 8:30 [name] M Medical Record Number:  [id number] Patient Account Number: [id number] Date of Birth/Sex: Treating RN: 1946/04/01 (75 y.o. [name] Primary Care Aloni Chuang: [name] Other Clinician: Referring Ysela Hettinger: Treating Chip Canepa/Extender: Edgardo Roys in Treatment: 2 Wound Status Wound Number: 5 Primary Venous Leg Ulcer Etiology: Wound Location: Left, Proximal, Medial Lower Leg Wound Open Wounding Event: Blister Status: Date Acquired: 10/20/2021 Comorbid Sleep Apnea, Arrhythmia, Coronary Artery Disease, Hypertension, Weeks Of Treatment: 1 History: Type II Diabetes, Received Chemotherapy Clustered Wound: No Wound Measurements Length: (cm) Width: (cm) Depth: (cm) Area: (cm) Volume: (cm) 0 % Reduction in Area: 100% 0 % Reduction in Volume: 100% 0 Epithelialization: Large (67-100%) 0 Tunneling: No 0 Undermining: No Wound Description Classification: Full Thickness Without Exposed Support Structures Wound Margin: Flat and Intact Exudate Amount: None Present Foul Odor After Cleansing: No Slough/Fibrino No Wound Bed Granulation Amount: None Present (0%) Exposed Structure Necrotic Amount: None Present (0%) Fascia Exposed: No Fat Layer (Subcutaneous Tissue) Exposed: No Tendon Exposed: No Muscle Exposed: No Joint Exposed: No Bone Exposed: No Electronic Signature(s) Signed: 10/28/2021 5:40:46 PM By: Levan Hurst RN, BSN Entered By: Levan Hurst on 10/28/2021 09:11:24 -------------------------------------------------------------------------------- [place] Details Patient Name: Date of Service: [name], [name] TO [place] S. 10/28/2021 8:30 [name] M Medical Record Number: [id number] Patient Account Number: [id number] Date of Birth/Sex: Treating RN: 03-17-1946 (75 y.o. [name] Primary Care Felma Pfefferle: [name] Other Clinician: Referring Shakeerah Gradel: Treating Audriana Aldama/Extender: Edgardo Roys in Treatment: 2 Vital  Signs Time Taken: 08:48 Temperature (F): 97.8 Pulse (bpm): 79 Respiratory Rate (breaths/min): 18 Blood Pressure (mmHg): 112/51 Reference Range: 80 - 120 mg / dl Electronic Signature(s) Signed: 10/28/2021 5:40:46 PM By: Levan Hurst RN, BSN Entered By: Levan Hurst on 10/28/2021 08:52:10

## 2021-10-28 NOTE — Progress Notes (Signed)
[name], [name] ([id number]) Visit Report for 10/28/2021 HPI Details Patient Name: Date of Service: [name] [name] [name] [name] [name] [name] 10/28/2021 8:30 A M Medical Record Number: [id number] Patient Account Number: [id number] Date of Birth/Sex: Treating RN: 07-Mar-1946 (75 y.o. [name] Primary Care Provider: Jamesetta [name] Other Clinician: Referring Provider: Treating Provider/Extender: [name] in Treatment: 2 History of Present Illness HPI Description: ADMISSION 10/13/2021 This is a 75 year old man who is actually from [place] speaking both [place] and [place]. We interviewed him through a [place] interpreter. That seemed [name] work quite well. The patient has had significant disease this year including diffuse large B-cell lymphoma stage III-IV. He is finished chemotherapy as directed by Dr. Lorenso [name] of oncology. Apparently he has had a good response. During the last visit in follow-up with Dr. Lorenso [name] on 09/23/2021 it was noted that he had bilateral lower extremity ulcers felt [name] be secondary [name] edema and the use of steroids as part of his chemotherapy. He is no longer on prednisone. He also has painful areas in the right inguinal area which apparently started as a large blister about 4 [name] 6 weeks ago and is contracted [name] 4 small areas according predominantly [name] the interpreter who seems [name] have been [name] most of his appointments. As noted he was in hospital with sepsis in July and had concerns for endocarditis treated with 6 weeks of antibiotics via PICC line. As noted he finished his [facility] EOP earlier this month. Past medical history includes diffuse large B-cell lymphoma is noted, atrial fibrillation on Eliquis and he is also on Plavix, lower extremity edema, coronary artery disease, type 2 diabetes. As noted possible endocarditis, sleep apnea and chronic A. fib on amiodarone His ABIs in our clinic were actually quite good at 1.07 and 1.04 10/20/2021; patient  today arrives with a new wound on the left anterior lower leg. The culture I did of the posterior area last week showed methicillin sensitive staph aureus. He states that the area in his right inguinal area is no longer is painful but he has a new open area in the pannus fold above this. I found myself wondering today whether this is all staff aureus related rather than anything more exotic related [name] his lymphoma or chemotherapy which ended about 3 weeks ago 12/14; the patient only has a left posterior leg and the right anterior lower leg wound from last week. The culture I did last week showed the same as has is a. He has been on cephalexin since 12 6 and everything looks a lot better. In fact the unusual areas in the right groin area have healed. I suspect all of this was methicillin sensitive staph aureus and I went over this with him today Electronic Signature(s) Signed: 10/28/2021 4:51:07 PM By: Stanley Ham MD Entered By: [name] on 10/28/2021 09:48:45 -------------------------------------------------------------------------------- Physical Exam Details Patient Name: Date of Service: [name], A [name] [name] [place] S. 10/28/2021 8:30 A M Medical Record Number: [id number] Patient Account Number: [id number] Date of Birth/Sex: Treating RN: [birth year] (75 y.o. [name] Primary Care Provider: Jamesetta [name] Other Clinician: Referring Provider: Treating Provider/Extender: [name] in Treatment: 2 Constitutional Sitting or standing Blood Pressure is within target range for patient.. Pulse regular and within target range for patient.Marland Kitchen Respirations regular, non-labored and within target range.. Temperature is normal and within the target range for the patient.Marland Kitchen Appears in no distress. Notes Wound exam; he has a small open area on the  left anterior tibia and the area on the left posterior are the only ones that are left here everything else is  healed. No surrounding cellulitis. The area in his right groin is also healed. The edema control in both legs is improved with her wraps Electronic Signature(s) Signed: 10/28/2021 4:51:07 PM By: Stanley Ham MD Entered By: [name] on 10/28/2021 09:45:47 -------------------------------------------------------------------------------- Physician Orders Details Patient Name: Date of Service: [name], A [name] [name] [name] S. 10/28/2021 8:30 A M Medical Record Number: [id number] Patient Account Number: [id number] Date of Birth/Sex: Treating RN: 11-20-1945 (75 y.o. [name] Primary Care Provider: Jamesetta [name] Other Clinician: Referring Provider: Treating Provider/Extender: [name] in Treatment: 2 Verbal / Phone Orders: No Diagnosis Coding ICD-10 Coding Code Description [phone] Non-pressure chronic ulcer of other part of left lower leg with other specified severity L97.818 Non-pressure chronic ulcer of other part of right lower leg with other specified severity S31.103D Unspecified open wound of abdominal wall, right lower quadrant without penetration into peritoneal cavity, subsequent encounter I87.313 Chronic venous hypertension (idiopathic) with ulcer of bilateral lower extremity C83.30 Diffuse large B-cell lymphoma, unspecified site B95.61 Methicillin susceptible Staphylococcus aureus infection as the cause of diseases classified elsewhere Follow-up Appointments ppointment in 1 week. - [name] Return A Bathing/ Shower/ Hygiene May shower with protection but do not get wound dressing(s) wet. - Ok [name] use Market researcher, can purchase at [facility], [facility], or [employer] Edema Control - Lymphedema / SCD / Other Elevate legs [name] the level of the heart or above for 30 minutes daily and/or when sitting, a frequency of: - throughout the day Avoid standing for long periods of time. Exercise regularly Wound Treatment Wound #1 - Lower Leg Wound  Laterality: Right, Anterior Cleanser: Soap and Water 1 x Per Week Discharge Instructions: May shower and wash wound with dial antibacterial soap and water prior [name] dressing change. Peri-Wound Care: Sween Lotion (Moisturizing lotion) 1 x Per Week Discharge Instructions: Apply moisturizing lotion as directed Topical: Mupirocin Ointment 1 x Per Week Discharge Instructions: Apply Mupirocin (Bactroban) directly [name] wound bed, under alginate Prim Dressing: KerraCel Ag Gelling Fiber Dressing, 2x2 in (silver alginate) 1 x Per Week ary Discharge Instructions: Apply silver alginate [name] wound bed as instructed Secondary Dressing: Woven Gauze Sponge, Non-Sterile 4x4 in 1 x Per Week Discharge Instructions: Apply over primary dressing as directed. Compression Wrap: CoFlex TLC XL 2-layer Compression System 4x7 (in/yd) 1 x Per Week Discharge Instructions: Apply CoFlex 2-layer compression as directed. (alt for 4 layer) Wound #2 - Lower Leg Wound Laterality: Left, Posterior Cleanser: Soap and Water 1 x Per Week Discharge Instructions: May shower and wash wound with dial antibacterial soap and water prior [name] dressing change. Peri-Wound Care: Sween Lotion (Moisturizing lotion) 1 x Per Week Discharge Instructions: Apply moisturizing lotion as directed Topical: Mupirocin Ointment 1 x Per Week Discharge Instructions: Apply Mupirocin (Bactroban) directly [name] wound bed, under alginate Prim Dressing: KerraCel Ag Gelling Fiber Dressing, 2x2 in (silver alginate) ary 1 x Per Week Discharge Instructions: Apply silver alginate [name] wound bed as instructed Secondary Dressing: Woven Gauze Sponge, Non-Sterile 4x4 in 1 x Per Week Discharge Instructions: Apply over primary dressing as directed. Secondary Dressing: ABD Pad, 8x10 1 x Per Week Discharge Instructions: Apply over primary dressing as directed. Compression Wrap: CoFlex TLC XL 2-layer Compression System 4x7 (in/yd) 1 x Per Week Discharge Instructions: Apply CoFlex  2-layer compression as directed. (alt for 4 layer) Patient Medications llergies: No Known Drug Allergies A  Notifications Medication Indication Start End MSSA infection 10/28/2021 cephalexin DOSE oral 500 mg capsule - 1 capsule oral q6h for an additional 7 days Electronic Signature(s) Signed: 10/28/2021 9:42:40 AM By: Stanley Ham MD Entered By: [name] on 10/28/2021 09:42:39 -------------------------------------------------------------------------------- Problem List Details Patient Name: Date of Service: [name], A [name] [name] [name]. 10/28/2021 8:30 A M Medical Record Number: [id number] Patient Account Number: [id number] Date of Birth/Sex: Treating RN: 1946/06/21 (75 y.o. [name] Primary Care Provider: Jamesetta [name] Other Clinician: Referring Provider: Treating Provider/Extender: [name] in Treatment: 2 Active Problems ICD-10 Encounter Code Description Active Date MDM Diagnosis L97.828 Non-pressure chronic ulcer of other part of left lower leg with other specified 10/13/2021 No Yes severity L97.818 Non-pressure chronic ulcer of other part of right lower leg with other specified 10/13/2021 No Yes severity S31.103D Unspecified open wound of abdominal wall, right lower quadrant without 10/13/2021 No Yes penetration into peritoneal cavity, subsequent encounter I87.313 Chronic venous hypertension (idiopathic) with ulcer of bilateral lower extremity 10/13/2021 No Yes C83.30 Diffuse large B-cell lymphoma, unspecified site 10/13/2021 No Yes B95.61 Methicillin susceptible Staphylococcus aureus infection as the cause of 10/20/2021 No Yes diseases classified elsewhere Inactive Problems Resolved Problems Electronic Signature(s) Signed: 10/28/2021 4:51:07 PM By: Stanley Ham MD Entered By: [name] on 10/28/2021 09:43:20 -------------------------------------------------------------------------------- Progress Note  Details Patient Name: Date of Service: [name], A [name] [name] [place]e [place] S. 10/28/2021 8:30 A M Medical Record Number: [id number] Patient Account Number: [id number] Date of Birth/Sex: Treating RN: Jan 06, 1946 (75 y.o. [name] Primary Care Provider: Jamesetta [name] Other Clinician: Referring Provider: Treating Provider/Extender: [name] in Treatment: 2 Subjective History of Present Illness (HPI) ADMISSION 10/13/2021 This is a 75 year old man who is actually from [place] speaking both [place] and [place]. We interviewed him through a [place] interpreter. That seemed [name] work quite well. The patient has had significant disease this year including diffuse large B-cell lymphoma stage III-IV. He is finished chemotherapy as directed by Dr. Lorenso [name] of oncology. Apparently he has had a good response. During the last visit in follow-up with Dr. Lorenso [name] on 09/23/2021 it was noted that he had bilateral lower extremity ulcers felt [name] be secondary [name] edema and the use of steroids as part of his chemotherapy. He is no longer on prednisone. He also has painful areas in the right inguinal area which apparently started as a large blister about 4 [name] 6 weeks ago and is contracted [name] 4 small areas according predominantly [name] the interpreter who seems [name] have been [name] most of his appointments. As noted he was in hospital with sepsis in July and had concerns for endocarditis treated with 6 weeks of antibiotics via PICC line. As noted he finished his [facility] EOP earlier this month. Past medical history includes diffuse large B-cell lymphoma is noted, atrial fibrillation on Eliquis and he is also on Plavix, lower extremity edema, coronary artery disease, type 2 diabetes. As noted possible endocarditis, sleep apnea and chronic A. fib on amiodarone His ABIs in our clinic were actually quite good at 1.07 and 1.04 10/20/2021; patient today arrives with a new wound on the left anterior lower  leg. The culture I did of the posterior area last week showed methicillin sensitive staph aureus. He states that the area in his right inguinal area is no longer is painful but he has a new open area in the pannus fold above this. I found myself wondering today whether this is all staff aureus related  rather than anything more exotic related [name] his lymphoma or chemotherapy which ended about 3 weeks ago 12/14; the patient only has a left posterior leg and the right anterior lower leg wound from last week. The culture I did last week showed the same as has is a. He has been on cephalexin since 12 6 and everything looks a lot better. In fact the unusual areas in the right groin area have healed. I suspect all of this was methicillin sensitive staph aureus and I went over this with him today Objective Constitutional Sitting or standing Blood Pressure is within target range for patient.. Pulse regular and within target range for patient.Marland Kitchen Respirations regular, non-labored and within target range.. Temperature is normal and within the target range for the patient.Marland Kitchen Appears in no distress. Vitals Time Taken: 8:48 AM, Temperature: 97.8 F, Pulse: 79 bpm, Respiratory Rate: 18 breaths/min, Blood Pressure: 112/51 mmHg. General Notes: Wound exam; he has a small open area on the left anterior tibia and the area on the left posterior are the only ones that are left here everything else is healed. No surrounding cellulitis. The area in his right groin is also healed. The edema control in both legs is improved with her wraps Integumentary (Hair, Skin) Wound #1 status is Open. Original cause of wound was Gradually Appeared. The date acquired was: 07/16/2021. The wound has been in treatment 2 weeks. The wound is located on the Right,Anterior Lower Leg. The wound measures 1cm length x 0.8cm width x 0.1cm depth; 0.628cm^2 area and 0.063cm^3 volume. There is Fat Layer (Subcutaneous Tissue) exposed. There is no tunneling  or undermining noted. There is a medium amount of serosanguineous drainage noted. The wound margin is flat and intact. There is large (67-100%) pink granulation within the wound bed. There is a small (1-33%) amount of necrotic tissue within the wound bed including Adherent Slough. Wound #2 status is Open. Original cause of wound was Gradually Appeared. The date acquired was: 09/28/2021. The wound has been in treatment 2 weeks. The wound is located on the Left,Posterior Lower Leg. The wound measures 4cm length x 0.8cm width x 0.1cm depth; 2.513cm^2 area and 0.251cm^3 volume. There is Fat Layer (Subcutaneous Tissue) exposed. There is no tunneling or undermining noted. There is a medium amount of serosanguineous drainage noted. The wound margin is flat and intact. There is small (1-33%) pink granulation within the wound bed. There is a large (67-100%) amount of necrotic tissue within the wound bed including Adherent Slough. Wound #3 status is Open. Original cause of wound was Gradually Appeared. The date acquired was: 07/16/2021. The wound has been in treatment 2 weeks. The wound is located on the Right Groin. The wound measures 0cm length x 0cm width x 0cm depth; 0cm^2 area and 0cm^3 volume. There is no tunneling or undermining noted. There is a none present amount of drainage noted. The wound margin is flat and intact. There is no granulation within the wound bed. There is no necrotic tissue within the wound bed. Wound #4 status is Open. Original cause of wound was Gradually Appeared. The date acquired was: 10/20/2021. The wound has been in treatment 1 weeks. The wound is located on the Right,Lateral Abdomen - Lower Quadrant. The wound measures 0cm length x 0cm width x 0cm depth; 0cm^2 area and 0cm^3 volume. There is no tunneling or undermining noted. There is a none present amount of drainage noted. The wound margin is flat and intact. There is no granulation within the wound bed.  There is no necrotic  tissue within the wound bed. Wound #5 status is Open. Original cause of wound was Blister. The date acquired was: 10/20/2021. The wound has been in treatment 1 weeks. The wound is located on the Left,Proximal,Medial Lower Leg. The wound measures 0cm length x 0cm width x 0cm depth; 0cm^2 area and 0cm^3 volume. There is no tunneling or undermining noted. There is a none present amount of drainage noted. The wound margin is flat and intact. There is no granulation within the wound bed. There is no necrotic tissue within the wound bed. 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 - [name] Bathing/ Shower/ Hygiene: May shower with protection but do not get wound dressing(s) wet. - Ok [name] use Market researcher, can purchase at [facility], [facility], or [employer] Edema Control - Lymphedema / SCD / Other: Elevate legs [name] the level of the heart or above for 30 minutes daily and/or when sitting, a frequency of: - throughout the day Avoid standing for long periods of time. Exercise regularly The following medication(s) was prescribed: cephalexin oral 500 mg capsule 1 capsule oral q6h for an additional 7 days for MSSA infection starting 10/28/2021 WOUND #1: - Lower Leg Wound Laterality: Right, Anterior Cleanser: Soap and Water 1 x Per Week/ Discharge Instructions: May shower and wash wound with dial antibacterial soap and water prior [name]  dressing change. Peri-Wound Care: Sween Lotion (Moisturizing lotion) 1 x Per Week/ Discharge Instructions: Apply moisturizing lotion as directed Topical: Mupirocin Ointment 1 x Per Week/ Discharge Instructions: Apply Mupirocin (Bactroban) directly [name] wound bed, under alginate Prim Dressing: KerraCel Ag Gelling Fiber Dressing, 2x2 in (silver alginate) 1 x Per Week/ ary Discharge Instructions: Apply silver alginate [name] wound bed as instructed Secondary Dressing: Woven Gauze Sponge, Non-Sterile 4x4 in 1 x Per Week/ Discharge Instructions: Apply over primary dressing as directed. Com pression Wrap: CoFlex TLC XL 2-layer Compression System 4x7 (in/yd) 1 x Per Week/ Discharge Instructions: Apply CoFlex 2-layer compression as directed. (alt for 4 layer) WOUND #2: - Lower Leg Wound Laterality: Left, Posterior Cleanser: Soap and Water 1 x Per Week/ Discharge Instructions: May shower and wash wound with dial antibacterial soap and water prior [name] dressing change. Peri-Wound Care: Sween Lotion (Moisturizing lotion) 1 x Per Week/ Discharge Instructions: Apply moisturizing lotion as directed Topical: Mupirocin Ointment 1 x Per Week/ Discharge Instructions: Apply Mupirocin (Bactroban) directly [name] wound bed, under alginate Prim Dressing: KerraCel Ag Gelling Fiber Dressing, 2x2 in (silver alginate) 1 x Per Week/ ary Discharge Instructions: Apply silver alginate [name] wound bed as instructed Secondary Dressing: Woven Gauze Sponge, Non-Sterile 4x4 in 1 x Per Week/ Discharge Instructions: Apply over primary dressing as directed. Secondary Dressing: ABD Pad, 8x10 1 x Per Week/ Discharge Instructions: Apply over primary dressing as directed. Com pression Wrap: CoFlex TLC XL 2-layer Compression System 4x7 (in/yd) 1 x Per Week/ Discharge Instructions: Apply CoFlex 2-layer compression as directed. (alt for 4 layer) 1. We continued with silver alginate [name] the 2 remaining wounds. Bactroban underneath 2. He has the  right anterior  and left posterior leg wounds. All of these appear better 3. Everything in the right inguinal area has healed 4. I suspect all of this is methicillin sensitive staph aureus related likely transmitted by scratching. We talked about this. 5. We have been wrapping his legs which he does not like however I think he should be healed soon. He might require at least support hose or compression stockings [name] control the edema in his leg 6. I do not think this is severe enough [name] consider Hibiclens unless this reoccurs Electronic Signature(s) Signed: 10/28/2021 9:49:57 AM By: Stanley Ham MD Entered By: [name] on 10/28/2021 09:49:57 -------------------------------------------------------------------------------- SuperBill Details Patient Name: Date of Service: [name], A [name] [name] [name] 10/28/2021 Medical Record Number: [id number] Patient Account Number: [id number] Date of Birth/Sex: Treating RN: 1946/07/03 (75 y.o. [name] Primary Care Provider: Jamesetta [name] Other Clinician: Referring Provider: Treating Provider/Extender: [name] in Treatment: 2 Diagnosis Coding ICD-10 Codes Code Description [phone] Non-pressure chronic ulcer of other part of left lower leg with other specified severity L97.818 Non-pressure chronic ulcer of other part of right lower leg with other specified severity S31.103D Unspecified open wound of abdominal wall, right lower quadrant without penetration into peritoneal cavity, subsequent encounter I87.313 Chronic venous hypertension (idiopathic) with ulcer of bilateral lower extremity C83.30 Diffuse large B-cell lymphoma, unspecified site B95.61 Methicillin susceptible Staphylococcus aureus infection as the cause of diseases classified elsewhere Facility Procedures CPT4: Code [id number] Description: 81 BILATERAL: Application of multi-layer venous compression system; leg (below knee), including  ankle and t. Modifier: Quantity: 1 Physician Procedures : CPT4 Code Description Modifier [id number] - WC PHYS LEVEL 4 - EST PT ICD-10 Diagnosis Description L97.828 Non-pressure chronic ulcer of other part of left lower leg with other specified severity L97.818 Non-pressure chronic ulcer of other part of  right lower leg with other specified severity I87.313 Chronic venous hypertension (idiopathic) with ulcer of bilateral lower extremity B95.61 Methicillin susceptible Staphylococcus aureus infection as the cause of diseases classified elsewher Quantity: 1 e Electronic Signature(s) Signed: 10/28/2021 4:51:07 PM By: Stanley Ham MD Entered By: [name] on 10/28/2021 09:50:27

## 2021-10-28 NOTE — Addendum Note (Signed)
Addendum  created 10/28/21 1039 by Suan Halter, CRNA   Charge Capture section accepted

## 2021-11-04 ENCOUNTER — Other Ambulatory Visit: Payer: Self-pay

## 2021-11-04 ENCOUNTER — Ambulatory Visit (HOSPITAL_COMMUNITY)
Admission: RE | Admit: 2021-11-04 | Discharge: 2021-11-04 | Disposition: A | Discharge status: Home / Self Care | Payer: Medicare HMO | Source: Ambulatory Visit | Attending: Hematology and Oncology | Admitting: Hematology and Oncology

## 2021-11-04 DIAGNOSIS — C8338 Diffuse large B-cell lymphoma, lymph nodes of multiple sites: Secondary | ICD-10-CM | POA: Diagnosis not present

## 2021-11-04 LAB — GLUCOSE, CAPILLARY: Glucose-Capillary: 238 mg/dL — ABNORMAL HIGH (ref 70–99)

## 2021-11-04 MED ORDER — FLUDEOXYGLUCOSE F - 18 (FDG) INJECTION
12.7000 | Freq: Once | INTRAVENOUS | Status: AC | PRN
  Administered 2021-11-04: 09:00:00 12.69 via INTRAVENOUS

## 2021-11-05 ENCOUNTER — Encounter (HOSPITAL_BASED_OUTPATIENT_CLINIC_OR_DEPARTMENT_OTHER): Payer: Medicare HMO | Admitting: Internal Medicine

## 2021-11-05 NOTE — Progress Notes (Signed)
[name], [name] ([id number]) Visit Report for 11/05/2021 HPI Details Patient Name: Date of Service: GO [name] NA TO [name] 11/05/2021 9:30 A M Medical Record Number: [id number] Patient Account Number: [id number] Date of Birth/Sex: Treating RN: [birth year] (75 y.o. [name] Primary Care Provider: [name] Other Clinician: Referring Provider: Treating Provider/Extender: Edgardo Roys in Treatment: 3 History of Present Illness HPI Description: ADMISSION 10/13/2021 This is a 75 year old man who is actually from [place] speaking both [place] and [place]. We interviewed him through a [place] interpreter. That seemed to work quite well. The patient has had significant disease this year including diffuse large B-cell lymphoma stage III-IV. He is finished chemotherapy as directed by Dr. Lorenso Courier of oncology. Apparently he has had a good response. During the last visit in follow-up with Dr. Lorenso Courier on 09/23/2021 it was noted that he had bilateral lower extremity ulcers felt to be secondary to edema and the use of steroids as part of his chemotherapy. He is no longer on prednisone. He also has painful areas in the right inguinal area which apparently started as a large blister about 4 to 6 weeks ago and is contracted to 4 small areas according predominantly to the interpreter who seems to have been to most of his appointments. As noted he was in hospital with sepsis in July and had concerns for endocarditis treated with 6 weeks of antibiotics via PICC line. As noted he finished his [facility] E[facility] earlier this month. Past medical history includes diffuse large B-cell lymphoma is noted, atrial fibrillation on Eliquis and he is also on Plavix, lower extremity edema, coronary artery disease, type 2 diabetes. As noted possible endocarditis, sleep apnea and chronic A. fib on amiodarone His ABIs in our clinic were actually quite good at 1.07 and 1.04 10/20/2021; patient  today arrives with a new wound on the left anterior lower leg. The culture I did of the posterior area last week showed methicillin sensitive staph aureus. He states that the area in his right inguinal area is no longer is painful but he has a new open area in the pannus fold above this. I found myself wondering today whether this is all staff aureus related rather than anything more exotic related to his lymphoma or chemotherapy which ended about 3 weeks ago 12/14; the patient only has a left posterior leg and the right anterior lower leg wound from last week. The culture I did last week showed the same as has is a. He has been on cephalexin since 12 6 and everything looks a lot better. In fact the unusual areas in the right groin area have healed. I suspect all of this was methicillin sensitive staph aureus and I went over this with him today 12/22; the patient has 2 open wounds 1 on the right anterior and the one on the left posterior. Both of these had eschar which I removed. These are not closed. His edema control is good but he does not have compression stockings Electronic Signature(s) Signed: 11/05/2021 3:43:54 PM By: Linton Ham MD Entered By: Linton Ham on 11/05/2021 10:08:14 -------------------------------------------------------------------------------- Physical Exam Details Patient Name: Date of Service: [name], A NA TO [place] S. 11/05/2021 9:30 A M Medical Record Number: [id number] Patient Account Number: [id number] Date of Birth/Sex: Treating RN: [birth year] (75 y.o. [name] Primary Care Provider: [name] Other Clinician: Referring Provider: Treating Provider/Extender: Edgardo Roys in Treatment: 3 Constitutional Sitting or standing Blood Pressure is within target range  for patient.. Pulse regular and within target range for patient.Marland Kitchen Respirations regular, non-labored and within target range.. Temperature is normal and  within the target range for the patient.Marland Kitchen Appears in no distress. Notes Wound exam; small anterior right anterior tibia and the left posterior. I removed eschar with a #3 curette these are not closed. Electronic Signature(s) Signed: 11/05/2021 3:43:54 PM By: Linton Ham MD Entered By: Linton Ham on 11/05/2021 10:08:53 -------------------------------------------------------------------------------- Physician Orders Details Patient Name: Date of Service: [name], A NA TO [name]. 11/05/2021 9:30 A M Medical Record Number: [id number] Patient Account Number: [id number] Date of Birth/Sex: Treating RN: 1946/05/13 (75 y.o. [name] Primary Care Provider: [name] Other Clinician: Referring Provider: Treating Provider/Extender: Edgardo Roys in Treatment: 3 Verbal / Phone Orders: No Diagnosis Coding ICD-10 Coding Code Description [phone] Non-pressure chronic ulcer of other part of left lower leg with other specified severity L97.818 Non-pressure chronic ulcer of other part of right lower leg with other specified severity S31.103D Unspecified open wound of abdominal wall, right lower quadrant without penetration into peritoneal cavity, subsequent encounter I87.313 Chronic venous hypertension (idiopathic) with ulcer of bilateral lower extremity C83.30 Diffuse large B-cell lymphoma, unspecified site B95.61 Methicillin susceptible Staphylococcus aureus infection as the cause of diseases classified elsewhere Follow-up Appointments ppointment in 1 week. - Dr. Dellia Nims Return A Other: - [employer] to purchase stockings [employer] for wound care supplies. Bathing/ Shower/ Hygiene May shower and wash wound with soap and water. - with dressing changes. Edema Control - Lymphedema / SCD / Other Elevate legs to the level of the heart or above for 30 minutes daily and/or when sitting, a frequency of: - 3-4 times a day throughout the  day. Avoid standing for long periods of time. Patient to wear own compression stockings every day. - Purchase compression stockings. Once arrive apply in the morning and apply at night. Exercise regularly Moisturize legs daily. - both legs every night before bed. Wound Treatment Wound #1 - Lower Leg Wound Laterality: Right, Anterior Cleanser: Soap and Water Every Other Day/30 Days Discharge Instructions: May shower and wash wound with dial antibacterial soap and water prior to dressing change. Peri-Wound Care: Skin Prep (DME) (Generic) Every Other Day/30 Days Discharge Instructions: Use skin prep as directed Peri-Wound Care: Sween Lotion (Moisturizing lotion) Every Other Day/30 Days Discharge Instructions: both legs every night before bed. Prim Dressing: KerraCel Ag Gelling Fiber Dressing, 2x2 in (silver alginate) (DME) (Generic) Every Other Day/30 Days ary Discharge Instructions: Apply silver alginate to wound bed as instructed Secondary Dressing: Zetuvit Plus Silicone Border Dressing 4x4 (in/in) (DME) (Generic) Every Other Day/30 Days Discharge Instructions: Apply silicone border over primary dressing as directed. Compression Wrap: compression stocking 20-37mmHg Every Other Day/30 Days Discharge Instructions: apply in the morning and remove at night. Wound #2 - Lower Leg Wound Laterality: Left, Posterior Cleanser: Soap and Water Every Other Day/30 Days Discharge Instructions: May shower and wash wound with dial antibacterial soap and water prior to dressing change. Peri-Wound Care: Skin Prep (DME) (Generic) Every Other Day/30 Days Discharge Instructions: Use skin prep as directed Peri-Wound Care: Sween Lotion (Moisturizing lotion) Every Other Day/30 Days Discharge Instructions: both legs every night before bed. Prim Dressing: KerraCel Ag Gelling Fiber Dressing, 2x2 in (silver alginate) (DME) (Generic) Every Other Day/30 Days ary Discharge Instructions: Apply silver alginate to wound  bed as instructed Secondary Dressing: Zetuvit Plus Silicone Border Dressing 4x4 (in/in) (DME) (Generic) Every Other Day/30 Days Discharge Instructions: Apply silicone border  over primary dressing as directed. Compression Wrap: compression stocking 20-4mmHg Every Other Day/30 Days Discharge Instructions: apply in the morning and remove at night. Electronic Signature(s) Signed: 11/05/2021 3:43:54 PM By: Linton Ham MD Signed: 11/05/2021 5:08:09 PM By: Deon Pilling RN, BSN Entered By: Deon Pilling on 11/05/2021 10:03:50 -------------------------------------------------------------------------------- Problem List Details Patient Name: Date of Service: [name], A NA TO [place] S. 11/05/2021 9:30 A M Medical Record Number: [id number] Patient Account Number: [id number] Date of Birth/Sex: Treating RN: Feb 04, 1946 (75 y.o. Lorette Ang, Meta.Reding Primary Care Provider: [name] Other Clinician: Referring Provider: Treating Provider/Extender: Edgardo Roys in Treatment: 3 Active Problems ICD-10 Encounter Code Description Active Date MDM Diagnosis L97.828 Non-pressure chronic ulcer of other part of left lower leg with other specified 10/13/2021 No Yes severity L97.818 Non-pressure chronic ulcer of other part of right lower leg with other specified 10/13/2021 No Yes severity I87.313 Chronic venous hypertension (idiopathic) with ulcer of bilateral lower extremity 10/13/2021 No Yes C83.30 Diffuse large B-cell lymphoma, unspecified site 10/13/2021 No Yes B95.61 Methicillin susceptible Staphylococcus aureus infection as the cause of 10/20/2021 No Yes diseases classified elsewhere Inactive Problems Resolved Problems ICD-10 Code Description Active Date Resolved Date S31.103D Unspecified open wound of abdominal wall, right lower quadrant without penetration 10/13/2021 10/13/2021 into peritoneal cavity, subsequent encounter Electronic Signature(s) Signed:  11/05/2021 3:43:54 PM By: Linton Ham MD Entered By: Linton Ham on 11/05/2021 10:06:34 -------------------------------------------------------------------------------- Progress Note Details Patient Name: Date of Service: [name], A NA TO [place]e Royalty S. 11/05/2021 9:30 A M Medical Record Number: [id number] Patient Account Number: [id number] Date of Birth/Sex: Treating RN: [birth year] (75 y.o. [name] Primary Care Provider: [name] Other Clinician: Referring Provider: Treating Provider/Extender: Edgardo Roys in Treatment: 3 Subjective History of Present Illness (HPI) ADMISSION 10/13/2021 This is a 75 year old man who is actually from [place] speaking both [place] and [place]. We interviewed him through a [place] interpreter. That seemed to work quite well. The patient has had significant disease this year including diffuse large B-cell lymphoma stage III-IV. He is finished chemotherapy as directed by Dr. Lorenso Courier of oncology. Apparently he has had a good response. During the last visit in follow-up with Dr. Lorenso Courier on 09/23/2021 it was noted that he had bilateral lower extremity ulcers felt to be secondary to edema and the use of steroids as part of his chemotherapy. He is no longer on prednisone. He also has painful areas in the right inguinal area which apparently started as a large blister about 4 to 6 weeks ago and is contracted to 4 small areas according predominantly to the interpreter who seems to have been to most of his appointments. As noted he was in hospital with sepsis in July and had concerns for endocarditis treated with 6 weeks of antibiotics via PICC line. As noted he finished his [facility] EOP earlier this month. Past medical history includes diffuse large B-cell lymphoma is noted, atrial fibrillation on Eliquis and he is also on Plavix, lower extremity edema, coronary artery disease, type 2 diabetes. As noted possible  endocarditis, sleep apnea and chronic A. fib on amiodarone His ABIs in our clinic were actually quite good at 1.07 and 1.04 10/20/2021; patient today arrives with a new wound on the left anterior lower leg. The culture I did of the posterior area last week showed methicillin sensitive staph aureus. He states that the area in his right inguinal area is no longer is painful but he has a new open area in the pannus  fold above this. I found myself wondering today whether this is all staff aureus related rather than anything more exotic related to his lymphoma or chemotherapy which ended about 3 weeks ago 12/14; the patient only has a left posterior leg and the right anterior lower leg wound from last week. The culture I did last week showed the same as has is a. He has been on cephalexin since 12 6 and everything looks a lot better. In fact the unusual areas in the right groin area have healed. I suspect all of this was methicillin sensitive staph aureus and I went over this with him today 12/22; the patient has 2 open wounds 1 on the right anterior and the one on the left posterior. Both of these had eschar which I removed. These are not closed. His edema control is good but he does not have compression stockings Objective Constitutional Sitting or standing Blood Pressure is within target range for patient.. Pulse regular and within target range for patient.Marland Kitchen Respirations regular, non-labored and within target range.. Temperature is normal and within the target range for the patient.Marland Kitchen Appears in no distress. Vitals Time Taken: 9:30 AM, Temperature: 97.9 F, Pulse: 76 bpm, Respiratory Rate: 20 breaths/min, Blood Pressure: 134/73 mmHg. General Notes: Wound exam; small anterior right anterior tibia and the left posterior. I removed eschar with a #3 curette these are not closed. Integumentary (Hair, Skin) Wound #1 status is Open. Original cause of wound was Gradually Appeared. The date acquired was:  07/16/2021. The wound has been in treatment 3 weeks. The wound is located on the Right,Anterior Lower Leg. The wound measures 0.1cm length x 0.1cm width x 0.1cm depth; 0.008cm^2 area and 0.001cm^3 volume. There is Fat Layer (Subcutaneous Tissue) exposed. There is no tunneling or undermining noted. There is a none present amount of drainage noted. The wound margin is flat and intact. There is no granulation within the wound bed. There is a small (1-33%) amount of necrotic tissue within the wound bed including Eschar. Wound #2 status is Open. Original cause of wound was Gradually Appeared. The date acquired was: 09/28/2021. The wound has been in treatment 3 weeks. The wound is located on the Left,Posterior Lower Leg. The wound measures 0.5cm length x 0.5cm width x 0.1cm depth; 0.196cm^2 area and 0.02cm^3 volume. There is Fat Layer (Subcutaneous Tissue) exposed. There is no tunneling or undermining noted. There is a none present amount of drainage noted. The wound margin is flat and intact. There is no granulation within the wound bed. There is a large (67-100%) amount of necrotic tissue within the wound bed including Eschar. 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 - Dr. Dellia Nims Other: - [employer] to purchase stockings [employer] for wound care supplies. Bathing/ Shower/ Hygiene: May shower and wash wound with soap and water. - with dressing changes. Edema Control - Lymphedema / SCD / Other: Elevate legs to the level of the heart  or above for 30 minutes daily and/or when sitting, a frequency of: - 3-4 times a day throughout the day. Avoid standing for long periods of time. Patient to wear own compression stockings every day. -  Purchase compression stockings. Once arrive apply in the morning and apply at night. Exercise regularly Moisturize legs daily. - both legs every night before bed. WOUND #1: - Lower Leg Wound Laterality: Right, Anterior Cleanser: Soap and Water Every Other Day/30 Days Discharge Instructions: May shower and wash wound with dial antibacterial soap and water prior to dressing change. Peri-Wound Care: Skin Prep (DME) (Generic) Every Other Day/30 Days Discharge Instructions: Use skin prep as directed Peri-Wound Care: Sween Lotion (Moisturizing lotion) Every Other Day/30 Days Discharge Instructions: both legs every night before bed. Prim Dressing: KerraCel Ag Gelling Fiber Dressing, 2x2 in (silver alginate) (DME) (Generic) Every Other Day/30 Days ary Discharge Instructions: Apply silver alginate to wound bed as instructed Secondary Dressing: Zetuvit Plus Silicone Border Dressing 4x4 (in/in) (DME) (Generic) Every Other Day/30 Days Discharge Instructions: Apply silicone border over primary dressing as directed. Com pression Wrap: compression stocking 20-56mmHg Every Other Day/30 Days Discharge Instructions: apply in the morning and remove at night. WOUND #2: - Lower Leg Wound Laterality: Left, Posterior Cleanser: Soap and Water Every Other Day/30 Days Discharge Instructions: May shower and wash wound with dial antibacterial soap and water prior to dressing change. Peri-Wound Care: Skin Prep (DME) (Generic) Every Other Day/30 Days Discharge Instructions: Use skin prep as directed Peri-Wound Care: Sween Lotion (Moisturizing lotion) Every Other Day/30 Days Discharge Instructions: both legs every night before bed. Prim Dressing: KerraCel Ag Gelling Fiber Dressing, 2x2 in (silver alginate) (DME) (Generic)  Every Other Day/30 Days ary Discharge Instructions: Apply silver alginate to wound bed as instructed Secondary Dressing: Zetuvit Plus Silicone Border Dressing 4x4 (in/in) (DME) (Generic) Every Other Day/30 Days Discharge Instructions: Apply silicone border over primary dressing as directed. Com pression Wrap: compression stocking 20-28mmHg Every Other Day/30 Days Discharge Instructions: apply in the morning and remove at night. 1. The patient was vehemently opposed to further compression today. I could not talk him into it. We are going to use silver alginate with a bordered foam cover to both areas we will order supplies to his home via [facility] home [facility] 2. I am concerned about the absence of compression stockings here although most of the patient's problem here was probably methicillin sensitive staph aureus both in the lower legs and groin area. I think these were small areas of folliculitis/cellulitis that opened and the wounds. He has completed his antibiotics. 3. We gave him his measurements and the phone number for elastic therapy for compression stockings. Electronic Signature(s) Signed: 11/05/2021 3:43:54 PM By: Linton Ham MD Entered By: Linton Ham on 11/05/2021 10:14:03 -------------------------------------------------------------------------------- SuperBill Details Patient Name: Date of Service: [name], A NA TO [name] 11/05/2021 Medical Record Number: [id number] Patient Account Number: [id number] Date of Birth/Sex: Treating RN: 05/27/1946 (75 y.o. [name] Primary Care Provider: [name] Other Clinician: Referring Provider: Treating Provider/Extender: Edgardo Roys in Treatment: 3 Diagnosis Coding ICD-10 Codes Code Description [id number] Non-pressure chronic ulcer of other part of left lower leg with other specified severity L97.818 Non-pressure chronic ulcer of other part of right lower leg with other specified  severity I87.313 Chronic venous hypertension (idiopathic) with ulcer of bilateral lower extremity C83.30 Diffuse large B-cell lymphoma, unspecified site B95.61 Methicillin susceptible Staphylococcus aureus infection as the cause of diseases classified elsewhere Physician Procedures : CPT4 Code Description Modifier [id number] - WC PHYS LEVEL 3 - EST PT ICD-10 Diagnosis Description L97.828 Non-pressure chronic ulcer of other part of left lower leg with other specified severity L97.818 Non-pressure chronic ulcer of other part  of  right lower leg with other specified severity I87.313 Chronic venous hypertension (idiopathic) with ulcer of bilateral lower extremity B95.61 Methicillin susceptible Staphylococcus aureus infection as the cause of diseases classified elsewhe Quantity: 1 re Electronic Signature(s) Signed: 11/05/2021 3:43:54 PM By: Linton Ham MD Entered By: Linton Ham on 11/05/2021 10:14:31

## 2021-11-05 NOTE — Progress Notes (Signed)
ADALBERTO, METZGAR ([id number]) Visit Report for 11/05/2021 Arrival Information Details Patient Name: Date of Service: [name] NA TO [name] 11/05/2021 9:30 A M Medical Record Number: [id number] Patient Account Number: [id number] Date of Birth/Sex: Treating RN: 09-11-1946 (75 y.o. [name] Primary Care Leen Tworek: [name] Other Clinician: Referring Saavi Mceachron: Treating Korbyn Vanes/Extender: Edgardo Roys in Treatment: 3 Visit Information History Since Last Visit Added or deleted any medications: No Patient Arrived: Kasandra Knudsen Any new allergies or adverse reactions: No Arrival Time: 09:35 Had a fall or experienced change in No Accompanied By: family and interpreter activities of daily living that may affect Transfer Assistance: None risk of falls: Patient Identification Verified: Yes Signs or symptoms of abuse/neglect since last visito No Secondary Verification Process Completed: Yes Hospitalized since last visit: No Patient Requires Transmission-Based Precautions: No Implantable device outside of the clinic excluding No Patient Has Alerts: Yes cellular tissue based products placed in the center Patient Alerts: Patient on Blood Thinner since last visit: Has Dressing in Place as Prescribed: Yes Has Compression in Place as Prescribed: Yes Pain Present Now: No Electronic Signature(s) Signed: 11/05/2021 5:08:09 PM By: Deon Pilling RN, BSN Entered By: Deon Pilling on 11/05/2021 09:35:35 -------------------------------------------------------------------------------- Encounter Discharge Information Details Patient Name: Date of Service: [name], A NA TO Tonye Royalty S. 11/05/2021 9:30 A M Medical Record Number: [id number] Patient Account Number: [id number] Date of Birth/Sex: Treating RN: 1946-07-22 (75 y.o. [name] Primary Care Hilberto Burzynski: [name] Other Clinician: Referring Danamarie Minami: Treating Kyleigh Nannini/Extender: Edgardo Roys in Treatment: 3 Encounter Discharge Information Items Discharge Condition: Stable Ambulatory Status: Cane Discharge Destination: Home Transportation: Private Auto Accompanied By: family Schedule Follow-up Appointment: Yes Clinical Summary of Care: Electronic Signature(s) Signed: 11/05/2021 5:08:09 PM By: Deon Pilling RN, BSN Entered By: Deon Pilling on 11/05/2021 10:05:09 -------------------------------------------------------------------------------- Lower Extremity Assessment Details Patient Name: Date of Service: GO [name], A NA TO [place] S. 11/05/2021 9:30 A M Medical Record Number: [id number] Patient Account Number: [id number] Date of Birth/Sex: Treating RN: 1946-06-29 (75 y.o. Lorette Ang, Meta.Reding Primary Care Katlin Bortner: [name] Other Clinician: Referring Ezma Rehm: Treating Kinsleigh Ludolph/Extender: Edgardo Roys in Treatment: 3 Edema Assessment Assessed: Shirlyn Goltz: Yes] [Right: Yes] Edema: [Left: Yes] [Right: Yes] Calf Left: Right: Point of Measurement: 30 cm From Medial Instep 36 cm 35 cm Ankle Left: Right: Point of Measurement: 11 cm From Medial Instep 26.5 cm 27 cm Knee To Floor Left: Right: From Medial Instep 50 cm 50 cm Vascular Assessment Pulses: Dorsalis Pedis Palpable: [Left:Yes] [Right:Yes] Electronic Signature(s) Signed: 11/05/2021 5:08:09 PM By: Deon Pilling RN, BSN Entered By: Deon Pilling on 11/05/2021 10:04:26 -------------------------------------------------------------------------------- Multi Wound Chart Details Patient Name: Date of Service: [name], A NA TO Tonye Royalty S. 11/05/2021 9:30 A M Medical Record Number: [id number] Patient Account Number: [id number] Date of Birth/Sex: Treating RN: [birth year] (75 y.o. Lorette Ang, Meta.Reding Primary Care Josalyn Dettmann: [name] Other Clinician: Referring Valeen Borys: Treating Sherrell Weir/Extender: Edgardo Roys in  Treatment: 3 Vital Signs Height(in): Pulse(bpm): 39 Weight(lbs): Blood Pressure(mmHg): 134/73 Body Mass Index(BMI): Temperature(F): 97.9 Respiratory Rate(breaths/min): 20 Photos: [1:Right, Anterior Lower Leg] [2:Left, Posterior Lower Leg] [N/A:N/A N/A] Wound Location: [1:Gradually Appeared] [2:Gradually Appeared] [N/A:N/A] Wounding Event: [1:Venous Leg Ulcer] [2:Venous Leg Ulcer] [N/A:N/A] Primary Etiology: [1:Sleep Apnea, Arrhythmia, Coronary Sleep Apnea, Arrhythmia, Coronary N/A] Comorbid History: [1:Artery Disease, Hypertension, Type II Artery Disease, Hypertension, Type II Diabetes, Received Chemotherapy Diabetes, Received Chemotherapy 07/16/2021] [2:09/28/2021] [N/A:N/A] Date Acquired: [1:3] [2:3] [N/A:N/A] Weeks of Treatment: [1:Open] [2:Open] [N/A:N/A]  Wound Status: [1:No] [2:Yes] [N/A:N/A] Clustered Wound: [1:N/A] [2:1] [N/A:N/A] Clustered Quantity: [1:0.1x0.1x0.1] [2:0.5x0.5x0.1] [N/A:N/A] Measurements L x W x D (cm) [1:0.008] [2:0.196] [N/A:N/A] A (cm) : rea [1:0.001] [2:0.02] [N/A:N/A] Volume (cm) : [1:99.20%] [2:92.10%] [N/A:N/A] % Reduction in Area: [1:98.90%] [2:91.90%] [N/A:N/A] % Reduction in Volume: [1:Full Thickness Without Exposed] [2:Full Thickness Without Exposed] [N/A:N/A] Classification: [1:Support Structures None Present] [2:Support Structures None Present] [N/A:N/A] Exudate A mount: [1:Flat and Intact] [2:Flat and Intact] [N/A:N/A] Wound Margin: [1:None Present (0%)] [2:None Present (0%)] [N/A:N/A] Granulation Amount: [1:Small (1-33%)] [2:Large (67-100%)] [N/A:N/A] Necrotic Amount: [1:Eschar] [2:Eschar] [N/A:N/A] Necrotic Tissue: [1:Fat Layer (Subcutaneous Tissue): Yes Fat Layer (Subcutaneous Tissue): Yes N/A] Exposed Structures: [1:Fascia: No Tendon: No Muscle: No Joint: No Bone: No Large (67-100%)] [2:Fascia: No Tendon: No Muscle: No Joint: No Bone: No Large (67-100%)] [N/A:N/A] Treatment Notes Wound #1 (Lower Leg) Wound Laterality: Right,  Anterior Cleanser Soap and Water Discharge Instruction: May shower and wash wound with dial antibacterial soap and water prior to dressing change. Peri-Wound Care Skin Prep Discharge Instruction: Use skin prep as directed Sween Lotion (Moisturizing lotion) Discharge Instruction: both legs every night before bed. Topical Primary Dressing KerraCel Ag Gelling Fiber Dressing, 2x2 in (silver alginate) Discharge Instruction: Apply silver alginate to wound bed as instructed Secondary Dressing Zetuvit Plus Silicone Border Dressing 4x4 (in/in) Discharge Instruction: Apply silicone border over primary dressing as directed. Secured With Compression Wrap compression stocking 20-87mmHg Discharge Instruction: apply in the morning and remove at night. Compression Stockings Add-Ons Wound #2 (Lower Leg) Wound Laterality: Left, Posterior Cleanser Soap and Water Discharge Instruction: May shower and wash wound with dial antibacterial soap and water prior to dressing change. Peri-Wound Care Skin Prep Discharge Instruction: Use skin prep as directed Sween Lotion (Moisturizing lotion) Discharge Instruction: both legs every night before bed. Topical Primary Dressing KerraCel Ag Gelling Fiber Dressing, 2x2 in (silver alginate) Discharge Instruction: Apply silver alginate to wound bed as instructed Secondary Dressing Zetuvit Plus Silicone Border Dressing 4x4 (in/in) Discharge Instruction: Apply silicone border over primary dressing as directed. Secured With Compression Wrap compression stocking 20-19mmHg Discharge Instruction: apply in the morning and remove at night. Compression Stockings Add-Ons Electronic Signature(s) Signed: 11/05/2021 3:43:54 PM By: Linton Ham MD Signed: 11/05/2021 5:08:09 PM By: Deon Pilling RN, BSN Entered By: Linton Ham on 11/05/2021 10:06:43 -------------------------------------------------------------------------------- Multi-Disciplinary Care Plan  Details Patient Name: Date of Service: [name], Loni Muse NA TO [name]. 11/05/2021 9:30 A M Medical Record Number: [id number] Patient Account Number: [id number] Date of Birth/Sex: Treating RN: 1945/11/23 (75 y.o. [name] Primary Care Kimiye Strathman: [name] Other Clinician: Referring Kamalei Roeder: Treating Connie Hilgert/Extender: Edgardo Roys in Treatment: 3 Multidisciplinary Care Plan reviewed with physician Active Inactive Abuse / Safety / Falls / Self Care Management Nursing Diagnoses: History of Falls Potential for falls Potential for injury related to falls Goals: Patient will not experience any injury related to falls Date Initiated: 10/13/2021 Target Resolution Date: 11/13/2021 Goal Status: Active Patient/caregiver will verbalize/demonstrate measures taken to prevent injury and/or falls Date Initiated: 10/13/2021 Target Resolution Date: 11/13/2021 Goal Status: Active Interventions: Assess Activities of Daily Living upon admission and as needed Assess fall risk on admission and as needed Assess: immobility, friction, shearing, incontinence upon admission and as needed Assess impairment of mobility on admission and as needed per policy Assess personal safety and home safety (as indicated) on admission and as needed Assess self care needs on admission and as needed Provide education on fall prevention Provide education on personal and home safety Notes:  Nutrition Nursing Diagnoses: Impaired glucose control: actual or potential Potential for alteratiion in Nutrition/Potential for imbalanced nutrition Goals: Patient/caregiver agrees to and verbalizes understanding of need to use nutritional supplements and/or vitamins as prescribed Date Initiated: 10/13/2021 Target Resolution Date: 11/13/2021 Goal Status: Active Patient/caregiver will maintain therapeutic glucose control Date Initiated: 10/13/2021 Target Resolution Date: 11/13/2021 Goal  Status: Active Interventions: Assess HgA1c results as ordered upon admission and as needed Assess patient nutrition upon admission and as needed per policy Provide education on elevated blood sugars and impact on wound healing Provide education on nutrition Treatment Activities: Education provided on Nutrition : 10/13/2021 Notes: Wound/Skin Impairment Nursing Diagnoses: Impaired tissue integrity Knowledge deficit related to ulceration/compromised skin integrity Goals: Patient/caregiver will verbalize understanding of skin care regimen Date Initiated: 10/13/2021 Target Resolution Date: 11/13/2021 Goal Status: Active Interventions: Assess patient/caregiver ability to obtain necessary supplies Assess patient/caregiver ability to perform ulcer/skin care regimen upon admission and as needed Assess ulceration(s) every visit Provide education on ulcer and skin care Notes: Electronic Signature(s) Signed: 11/05/2021 5:08:09 PM By: Deon Pilling RN, BSN Entered By: Deon Pilling on 11/05/2021 09:53:32 -------------------------------------------------------------------------------- Pain Assessment Details Patient Name: Date of Service: [name] NA TO Tonye Royalty S. 11/05/2021 9:30 A M Medical Record Number: [id number] Patient Account Number: [id number] Date of Birth/Sex: Treating RN: 03-20-1946 (75 y.o. [name] Primary Care Latoia Eyster: [name] Other Clinician: Referring Gaynor Genco: Treating Mcihael Hinderman/Extender: Edgardo Roys in Treatment: 3 Active Problems Location of Pain Severity and Description of Pain Patient Has Paino No Site Locations Rate the pain. Rate the pain. Current Pain Level: 0 Pain Management and Medication Current Pain Management: Medication: No Cold Application: No Rest: No Massage: No Activity: No T.E.N.S.: No Heat Application: No Leg drop or elevation: No Is the Current Pain Management Adequate: Adequate How does  your wound impact your activities of daily livingo Sleep: No Bathing: No Appetite: No Relationship With Others: No Bladder Continence: No Emotions: No Bowel Continence: No Work: No Toileting: No Drive: No Dressing: No Hobbies: No Engineer, maintenance) Signed: 11/05/2021 5:08:09 PM By: Deon Pilling RN, BSN Entered By: Deon Pilling on 11/05/2021 09:36:01 -------------------------------------------------------------------------------- Patient/Caregiver Education Details Patient Name: Date of Service: [name] NA TO [name] 12/22/2022andnbsp9:30 A M Medical Record Number: [id number] Patient Account Number: [id number] Date of Birth/Gender: Treating RN: January 20, 1946 (75 y.o. [name] Primary Care Physician: [name] Other Clinician: Referring Physician: Treating Physician/Extender: Edgardo Roys in Treatment: 3 Education Assessment Education Provided To: Patient Education Topics Provided Wound/Skin Impairment: Handouts: Skin Care Do's and Dont's Methods: Explain/Verbal Responses: Reinforcements needed Electronic Signature(s) Signed: 11/05/2021 5:08:09 PM By: Deon Pilling RN, BSN Entered By: Deon Pilling on 11/05/2021 09:53:52 -------------------------------------------------------------------------------- Wound Assessment Details Patient Name: Date of Service: [name], A NA TO Moore S. 11/05/2021 9:30 A M Medical Record Number: [id number] Patient Account Number: [id number] Date of Birth/Sex: Treating RN: 1946/06/08 (75 y.o. [name] Primary Care Tashawn Greff: [name] Other Clinician: Referring Jaylin Roundy: Treating Kavir Savoca/Extender: Edgardo Roys in Treatment: 3 Wound Status Wound Number: 1 Primary Venous Leg Ulcer Etiology: Wound Location: Right, Anterior Lower Leg Wound Open Wounding Event: Gradually Appeared Status: Date Acquired: 07/16/2021 Comorbid Sleep Apnea,  Arrhythmia, Coronary Artery Disease, Hypertension, Weeks Of Treatment: 3 History: Type II Diabetes, Received Chemotherapy Clustered Wound: No Photos Wound Measurements Length: (cm) 0.1 Width: (cm) 0.1 Depth: (cm) 0.1 Area: (cm) 0.008 Volume: (cm) 0.001 % Reduction in Area: 99.2% % Reduction in Volume: 98.9% Epithelialization: Large (67-100%)  Tunneling: No Undermining: No Wound Description Classification: Full Thickness Without Exposed Support Structures Wound Margin: Flat and Intact Exudate Amount: None Present Foul Odor After Cleansing: No Slough/Fibrino No Wound Bed Granulation Amount: None Present (0%) Exposed Structure Necrotic Amount: Small (1-33%) Fascia Exposed: No Necrotic Quality: Eschar Fat Layer (Subcutaneous Tissue) Exposed: Yes Tendon Exposed: No Muscle Exposed: No Joint Exposed: No Bone Exposed: No Treatment Notes Wound #1 (Lower Leg) Wound Laterality: Right, Anterior Cleanser Soap and Water Discharge Instruction: May shower and wash wound with dial antibacterial soap and water prior to dressing change. Peri-Wound Care Skin Prep Discharge Instruction: Use skin prep as directed Sween Lotion (Moisturizing lotion) Discharge Instruction: both legs every night before bed. Topical Primary Dressing KerraCel Ag Gelling Fiber Dressing, 2x2 in (silver alginate) Discharge Instruction: Apply silver alginate to wound bed as instructed Secondary Dressing Zetuvit Plus Silicone Border Dressing 4x4 (in/in) Discharge Instruction: Apply silicone border over primary dressing as directed. Secured With Compression Wrap compression stocking 20-10mmHg Discharge Instruction: apply in the morning and remove at night. Compression Stockings Add-Ons Electronic Signature(s) Signed: 11/05/2021 5:08:09 PM By: Deon Pilling RN, BSN Entered By: Deon Pilling on 11/05/2021 09:49:29 -------------------------------------------------------------------------------- Wound  Assessment Details Patient Name: Date of Service: GO Bari Edward, A NA TO LIY S. 11/05/2021 9:30 A M Medical Record Number: [id number] Patient Account Number: [id number] Date of Birth/Sex: Treating RN: 1946-04-25 (75 y.o. [name] Primary Care Maripat Borba: [name] Other Clinician: Referring Tran Randle: Treating Cathleen Yagi/Extender: Edgardo Roys in Treatment: 3 Wound Status Wound Number: 2 Primary Venous Leg Ulcer Etiology: Wound Location: Left, Posterior Lower Leg Wound Open Wounding Event: Gradually Appeared Status: Date Acquired: 09/28/2021 Comorbid Sleep Apnea, Arrhythmia, Coronary Artery Disease, Hypertension, Weeks Of Treatment: 3 History: Type II Diabetes, Received Chemotherapy Clustered Wound: Yes Photos Wound Measurements Length: (cm) 0.5 Width: (cm) 0.5 Depth: (cm) 0.1 Clustered Quantity: 1 Area: (cm) 0.196 Volume: (cm) 0.02 % Reduction in Area: 92.1% % Reduction in Volume: 91.9% Epithelialization: Large (67-100%) Tunneling: No Undermining: No Wound Description Classification: Full Thickness Without Exposed Support Structures Wound Margin: Flat and Intact Exudate Amount: None Present Wound Bed Granulation Amount: None Present (0%) Necrotic Amount: Large (67-100%) Necrotic Quality: Eschar Foul Odor After Cleansing: No Slough/Fibrino No Exposed Structure Fascia Exposed: No Fat Layer (Subcutaneous Tissue) Exposed: Yes Tendon Exposed: No Muscle Exposed: No Joint Exposed: No Bone Exposed: No Treatment Notes Wound #2 (Lower Leg) Wound Laterality: Left, Posterior Cleanser Soap and Water Discharge Instruction: May shower and wash wound with dial antibacterial soap and water prior to dressing change. Peri-Wound Care Skin Prep Discharge Instruction: Use skin prep as directed Sween Lotion (Moisturizing lotion) Discharge Instruction: both legs every night before bed. Topical Primary Dressing KerraCel Ag Gelling  Fiber Dressing, 2x2 in (silver alginate) Discharge Instruction: Apply silver alginate to wound bed as instructed Secondary Dressing Zetuvit Plus Silicone Border Dressing 4x4 (in/in) Discharge Instruction: Apply silicone border over primary dressing as directed. Secured With Compression Wrap compression stocking 20-80mmHg Discharge Instruction: apply in the morning and remove at night. Compression Stockings Add-Ons Electronic Signature(s) Signed: 11/05/2021 5:08:09 PM By: Deon Pilling RN, BSN Entered By: Deon Pilling on 11/05/2021 09:56:48 -------------------------------------------------------------------------------- Vitals Details Patient Name: Date of Service: GO Bari Edward, A NA TO [place] S. 11/05/2021 9:30 A M Medical Record Number: [id number] Patient Account Number: [id number] Date of Birth/Sex: Treating RN: [birth year] (75 y.o. [name] Primary Care Irving Bloor: [name] Other Clinician: Referring Esparanza Krider: Treating Najai Waszak/Extender: Edgardo Roys in Treatment: 3 Vital Signs Time Taken:  09:30 Temperature (F): 97.9 Pulse (bpm): 76 Respiratory Rate (breaths/min): 20 Blood Pressure (mmHg): 134/73 Reference Range: 80 - 120 mg / dl Electronic Signature(s) Signed: 11/05/2021 5:08:09 PM By: Deon Pilling RN, BSN Entered By: Deon Pilling on 11/05/2021 09:35:51

## 2021-11-06 ENCOUNTER — Inpatient Hospital Stay: Payer: Medicare HMO

## 2021-11-06 ENCOUNTER — Inpatient Hospital Stay: Payer: Medicare HMO | Attending: Hematology and Oncology | Admitting: Hematology and Oncology

## 2021-11-06 ENCOUNTER — Other Ambulatory Visit: Payer: Self-pay

## 2021-11-06 VITALS — BP 139/71 | HR 71 | Temp 97.8°F | Resp 17 | Ht 72.0 in | Wt 256.4 lb

## 2021-11-06 DIAGNOSIS — C8333 Diffuse large B-cell lymphoma, intra-abdominal lymph nodes: Secondary | ICD-10-CM

## 2021-11-06 DIAGNOSIS — R600 Localized edema: Secondary | ICD-10-CM | POA: Diagnosis not present

## 2021-11-06 DIAGNOSIS — I129 Hypertensive chronic kidney disease with stage 1 through stage 4 chronic kidney disease, or unspecified chronic kidney disease: Secondary | ICD-10-CM | POA: Insufficient documentation

## 2021-11-06 DIAGNOSIS — R319 Hematuria, unspecified: Secondary | ICD-10-CM | POA: Insufficient documentation

## 2021-11-06 DIAGNOSIS — E1122 Type 2 diabetes mellitus with diabetic chronic kidney disease: Secondary | ICD-10-CM | POA: Insufficient documentation

## 2021-11-06 DIAGNOSIS — Z7901 Long term (current) use of anticoagulants: Secondary | ICD-10-CM | POA: Insufficient documentation

## 2021-11-06 DIAGNOSIS — G4733 Obstructive sleep apnea (adult) (pediatric): Secondary | ICD-10-CM | POA: Diagnosis not present

## 2021-11-06 DIAGNOSIS — D6481 Anemia due to antineoplastic chemotherapy: Secondary | ICD-10-CM | POA: Diagnosis not present

## 2021-11-06 DIAGNOSIS — T451X5D Adverse effect of antineoplastic and immunosuppressive drugs, subsequent encounter: Secondary | ICD-10-CM | POA: Diagnosis not present

## 2021-11-06 DIAGNOSIS — I4891 Unspecified atrial fibrillation: Secondary | ICD-10-CM | POA: Insufficient documentation

## 2021-11-06 LAB — CBC WITH DIFFERENTIAL (CANCER CENTER ONLY)
Abs Immature Granulocytes: 0.02 10*3/uL (ref 0.00–0.07)
Basophils Absolute: 0 10*3/uL (ref 0.0–0.1)
Basophils Relative: 1 %
Eosinophils Absolute: 0.3 10*3/uL (ref 0.0–0.5)
Eosinophils Relative: 4 %
HCT: 29.7 % — ABNORMAL LOW (ref 39.0–52.0)
Hemoglobin: 9.4 g/dL — ABNORMAL LOW (ref 13.0–17.0)
Immature Granulocytes: 0 %
Lymphocytes Relative: 20 %
Lymphs Abs: 1.2 10*3/uL (ref 0.7–4.0)
MCH: 25.3 pg — ABNORMAL LOW (ref 26.0–34.0)
MCHC: 31.6 g/dL (ref 30.0–36.0)
MCV: 79.8 fL — ABNORMAL LOW (ref 80.0–100.0)
Monocytes Absolute: 0.8 10*3/uL (ref 0.1–1.0)
Monocytes Relative: 13 %
Neutro Abs: 3.6 10*3/uL (ref 1.7–7.7)
Neutrophils Relative %: 62 %
Platelet Count: 169 10*3/uL (ref 150–400)
RBC: 3.72 MIL/uL — ABNORMAL LOW (ref 4.22–5.81)
RDW: 15.6 % — ABNORMAL HIGH (ref 11.5–15.5)
WBC Count: 5.9 10*3/uL (ref 4.0–10.5)
nRBC: 0 % (ref 0.0–0.2)

## 2021-11-06 LAB — LACTATE DEHYDROGENASE: LDH: 184 U/L (ref 98–192)

## 2021-11-06 LAB — CMP (CANCER CENTER ONLY)
ALT: 38 U/L (ref 0–44)
AST: 28 U/L (ref 15–41)
Albumin: 3.7 g/dL (ref 3.5–5.0)
Alkaline Phosphatase: 113 U/L (ref 38–126)
Anion gap: 7 (ref 5–15)
BUN: 25 mg/dL — ABNORMAL HIGH (ref 8–23)
CO2: 27 mmol/L (ref 22–32)
Calcium: 9 mg/dL (ref 8.9–10.3)
Chloride: 104 mmol/L (ref 98–111)
Creatinine: 1.38 mg/dL — ABNORMAL HIGH (ref 0.61–1.24)
GFR, Estimated: 54 mL/min — ABNORMAL LOW (ref >60)
Glucose, Bld: 351 mg/dL — ABNORMAL HIGH (ref 70–99)
Potassium: 4.5 mmol/L (ref 3.5–5.1)
Sodium: 138 mmol/L (ref 135–145)
Total Bilirubin: 0.4 mg/dL (ref 0.3–1.2)
Total Protein: 5.5 g/dL — ABNORMAL LOW (ref 6.5–8.1)

## 2021-11-06 NOTE — Progress Notes (Signed)
P had legs wrapping removed at the [facility] yesterday at his request. Today both legs are quite swollen and tight. Right leg has 2-3 areas of blisters, one blister has broken and clear fluid leaking from that. Pt asking for his legs to be re-wrapped. Advised that this [facility] does not have the same dressing supplies as the wound center but that I could cover blisters with vaseline gauze and kerlex and Coban to help his legs until  he goes back to wound center on 11/12/21. The [facility] is closed today for the Holiday.  Pt agreeable to this. Right wrapped as described above.  Open area on back of left ankle also covered with telfa pad and Kerlex as there is an open area there the size of a dime.  No purulent drainage noted.

## 2021-11-06 NOTE — Progress Notes (Signed)
[place] [phone]   [phone]  PROGRESS NOTE  Patient Care Team: [name], MD as PCP - General (Family Medicine)  Hematological/Oncological History # Diffuse Large B Cell Lymphoma, Stage III/IV 04/30/2021: presented with RLQ abdominal pain. CT abdomen/pelvis shows pathologic mesenteric, retroperitoneal, and pelvic adenopathy, as described above. Moderate splenomegaly. Lobulated retroperitoneal soft tissue masses in the region of the right ureteropelvic junction likely resulting in mild right hydronephrosis secondary to extrinsic mass effect as well as within the right retroperitoneum 05/04/2021: needle core biopsy of right retroperitoneal lymph node shows large B cell lymphoma. 05/13/2021: establish care with Dr. Lorenso Courier 05/23/2021-06/01/2021: admitted with urosepsis. Concern for endocarditis, started on 6 week course abx via PICC line.   06/09/2021-06/11/2021: Cycle 1 of R-CEOP 07/01/2021-07/03/2021: Cycle 2 of R-CEOP 07/22/2021-07/24/2021: Cycle 3 of R-CEOP 08/12/2021-08/14/2021: Cycle 4 of R-CEOP 09/02/2021-09/04/2021: Cycle 5 of R-CEOP 09/07/2021: PET CT scan showed marked interval improvement with respect to nodal disease in the neck, chest, abdomen and pelvis, near complete resolution of visible, lymph nodes. Deauville criteria 2/3. 09/23/2021-09/25/2021: Cycle 6 of R-CEOP 11/04/2021: NM PET CT scan showed no suspicious lymphadenopathy in the neck, chest, abdomen, or pelvis. Deauville criteria 1. Consistent with a complete metabolic response.   Interval History:  [name] 75 y.o. male with medical history significant for stage III/IV diffuse large B-cell lymphoma presents for a follow up visit. The patient's last visit was on 09/23/2021. In the interim since the last visit he completed his chemotherapy regimen and had a PET CT scan which confirmed complete response.   On exam today, [name] reports he continues to have weakness and  fatigue with low energy.  He notes that this is modestly improving from prior.  He is also having swelling of his lower extremities with blistering of his right leg.  He notes he is not currently having any infectious symptoms.  He notes his appetite has been good and he is not having any overt signs of bleeding.  He denies any fevers, chills, night sweats, shortness of breath, chest pain or cough. He has no other complaints.   The bulk of our discussion focused on the findings on the PET CT scan and the steps moving forward.  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 per dr Lucianne Lei dam note 06-18-2021   Hematuria 06/18/2021   Hypertension    Sleep apnea    uses cpap   Uses walker 07/24/2021   prn or cane   UTI (urinary tract infection)    finished antibiotics on 07-08-2021   Wound of skin 10/20/2021   sees wound care center for multiple dressing changes    SURGICAL HISTORY: Past Surgical History:  Procedure Laterality Date   CARDIAC CATHETERIZATION     CYSTOSCOPY W/ URETERAL STENT PLACEMENT Right 07/28/2021   Procedure: CYSTOSCOPY WITH RETROGRADE PYELOGRAM/URETERAL STENT EXCHANGE;  Surgeon: [name], MD;  Location[facility];  Service: Urology;  Laterality: Right;  30 MINS   CYSTOSCOPY W/ URETERAL STENT PLACEMENT Right 10/27/2021   Procedure: CYSTOSCOPY WITH RETROGRADE PYELOGRAM/ URETERAL STENT REMOVAL;  Surgeon: [name], MD;  Location: [facility];  Service: Urology;  Laterality: Right;   CYSTOSCOPY WITH RETROGRADE PYELOGRAM, URETEROSCOPY AND STENT PLACEMENT Right 05/01/2021   Procedure: CYSTOSCOPY WITH RIGHT   RETROGRADE PYELOGRAM,  AND RIGHT STENT PLACEMENT;  Surgeon: Festus Aloe, MD;  Location: [facility];  Service: Urology;  Laterality: Right;   EYE SURGERY     INJECTION OF SILICONE OIL Left 96/02/5408   Procedure: INJECTION OF SILICONE OIL;  Surgeon: Sherlynn Stalls, MD;  Location: [place];  Service: Ophthalmology;  Laterality: Left;   INJECTION OF SILICONE OIL Left 81/19/1478   Procedure: INJECTION OF SILICONE OIL;  Surgeon: Sherlynn Stalls, MD;  Location: [place];  Service: Ophthalmology;  Laterality: Left;   JOINT REPLACEMENT Right    hip   LASER PHOTO ABLATION Left 12/25/2020   Procedure: LASER PHOTO ABLATION;  Surgeon: Sherlynn Stalls, MD;  Location: [place];  Service: Ophthalmology;  Laterality: Left;   PARS PLANA VITRECTOMY Left 11/10/2020   Procedure: PARS PLANA VITRECTOMY WITH 25 GAUGE;  Surgeon: Sherlynn Stalls, MD;  Location: [place];  Service: Ophthalmology;  Laterality: Left;   PARS PLANA VITRECTOMY Left 12/25/2020   Procedure: PARS PLANA VITRECTOMY WITH 25 GAUGE IN LEFT EYE;  Surgeon: Sherlynn Stalls, MD;  Location: [place];  Service: Ophthalmology;  Laterality: Left;   PERFLUORONE INJECTION Left 12/25/2020   Procedure: PERFLUORONE INJECTION;  Surgeon: Sherlynn Stalls, MD;  Location: [place];  Service: Ophthalmology;  Laterality: Left;   PHOTOCOAGULATION WITH LASER Left 11/10/2020   Procedure: PHOTOCOAGULATION WITH LASER;  Surgeon: Sherlynn Stalls, MD;  Location: [place];  Service: Ophthalmology;  Laterality: Left;   removal of picc line     09-2021   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   Procedure: RETINECTOMY LEFT EYE;  Surgeon: Sherlynn Stalls, MD;  Location: [place];  Service: Ophthalmology;  Laterality: Left;   SILICON OIL REMOVAL Left 29/56/2130   Procedure: SILICONE OIL REMOVAL;  Surgeon: Sherlynn Stalls, MD;  Location: [place];  Service: Ophthalmology;  Laterality: Left;   TEE WITHOUT CARDIOVERSION N/A 05/29/2021   Procedure: TRANSESOPHAGEAL ECHOCARDIOGRAM (TEE);   Surgeon: Werner Lean, MD;  Location[facility];  Service: Cardiovascular;  Laterality: N/A;    SOCIAL HISTORY: Social History   Socioeconomic History   Marital status: Married    Spouse name: Not on file   Number of children: Not on file   Years of education: Not on file   Highest education level: Not on file  Occupational History   Not on file  Tobacco Use   Smoking status: Never   Smokeless tobacco: Never  Vaping Use   Vaping Use: Never used  Substance and Sexual Activity   Alcohol use: Not Currently   Drug use: Never   Sexual activity: Yes  Other Topics Concern   Not on file  Social History Narrative   Not on file   Social Determinants of Health   Financial Resource Strain: Not on file  Food Insecurity: Not on file  Transportation Needs: Not on file  Physical Activity: Not on file  Stress: Not on file  Social Connections: Not on file  Intimate Partner Violence: Not on file    FAMILY HISTORY: Family History  Problem Relation Age of Onset   CAD Other     ALLERGIES:  has No Known Allergies.  MEDICATIONS:  Current Outpatient Medications  Medication Sig Dispense Refill   amiodarone (PACERONE) 200 MG tablet Take 200 mg by mouth every morning.  clopidogrel (PLAVIX) 75 MG tablet Take 75 mg by mouth daily.     acetaminophen (TYLENOL) 650 MG CR tablet Take 650 mg by mouth every 8 (eight) hours as needed for pain or fever (headache). (Patient not taking: Reported on 09/02/2021)     allopurinol (ZYLOPRIM) 300 MG tablet Take 1 tablet (300 mg total) by mouth daily. (Patient not taking: Reported on 11/06/2021) 90 tablet 1   amLODipine (NORVASC) 10 MG tablet Take 1 tablet by mouth once daily 30 tablet 0   apixaban (ELIQUIS) 5 MG TABS tablet Take 5 mg by mouth 2 (two) times daily.     atorvastatin (LIPITOR) 20 MG tablet Take 20 mg by mouth at bedtime.     finasteride (PROSCAR) 5 MG tablet Take 5 mg by mouth at bedtime.     furosemide (LASIX) 20 MG tablet  Take 1 tablet (20 mg total) by mouth daily. 30 tablet 1   glipiZIDE (GLUCOTROL) 10 MG tablet Take 10 mg by mouth 2 (two) times daily.     Heparin Sodium Flush 100-0.9 UNIT/ML-% KIT Inject 500 Units into the vein 2 (two) times a week. Flush PICC with 250 units (1/2 syringe) 2 x weekly   discard remaining 12 kit 1   insulin glargine (LANTUS) 100 unit/mL SOPN Inject 34 Units into the skin at bedtime.     isosorbide mononitrate (IMDUR) 30 MG 24 hr tablet Take 30 mg by mouth every morning.     metFORMIN (GLUCOPHAGE) 1000 MG tablet Take 1,000 mg by mouth 2 (two) times daily.     metoprolol tartrate (LOPRESSOR) 50 MG tablet Take 1 tablet (50 mg total) by mouth 2 (two) times daily. 60 tablet 1   nitroGLYCERIN (NITROSTAT) 0.3 MG SL tablet Place 0.3 mg under the tongue every 5 (five) minutes x 3 doses as needed for chest pain. (Patient not taking: Reported on 09/02/2021)     oxyCODONE (OXY IR/ROXICODONE) 5 MG immediate release tablet Take 1 tablet (5 mg total) by mouth every 4 (four) hours as needed for severe pain. (Patient not taking: Reported on 11/06/2021) 20 tablet 0   prochlorperazine (COMPAZINE) 10 MG tablet Take 1 tablet (10 mg total) by mouth every 6 (six) hours as needed for nausea or vomiting. (Patient not taking: Reported on 09/02/2021) 30 tablet 0   Sodium Chloride Flush (NORMAL SALINE FLUSH) 0.9 % SOLN Flush PICC line with 68m 1-2 times a week as directed (Patient not taking: Reported on 09/02/2021) 120 mL 1   No current facility-administered medications for this visit.   Facility-Administered Medications Ordered in Other Visits  Medication Dose Route Frequency Provider Last Rate Last Admin   prochlorperazine (COMPAZINE) 10 MG tablet            sodium chloride flush (NS) 0.9 % injection 10 mL  10 mL Intracatheter PRN DLedell PeoplesIV, MD       sodium chloride flush (NS) 0.9 % injection 10 mL  10 mL Intravenous Once DOrson Slick MD        REVIEW OF SYSTEMS:   Constitutional: ( - )  fevers, ( - )  chills , ( - ) night sweats Eyes: ( - ) blurriness of vision, ( - ) double vision, ( - ) watery eyes Ears, nose, mouth, throat, and face: ( - ) mucositis, ( - ) sore throat Respiratory: ( - ) cough, ( - ) dyspnea, ( - ) wheezes Cardiovascular: ( - ) palpitation, ( - ) chest discomfort, ( +)  lower extremity swelling Gastrointestinal:  ( - ) nausea, ( - ) heartburn, ( - ) change in bowel habits Skin: ( - ) abnormal skin rashes Lymphatics: ( - ) new lymphadenopathy, ( - ) easy bruising Neurological: ( - ) numbness, ( - ) tingling, ( - ) new weaknesses Behavioral/Psych: ( - ) mood change, ( - ) new changes  All other systems were reviewed with the patient and are negative.  PHYSICAL EXAMINATION: ECOG PERFORMANCE STATUS: 1 - Symptomatic but completely ambulatory  Vitals:   11/06/21 1005  BP: 139/71  Pulse: 71  Resp: 17  Temp: 97.8 F (36.6 C)  SpO2: 97%     Filed Weights   11/06/21 1005  Weight: 256 lb 6.4 oz (116.3 kg)    GENERAL: Well-appearing elderly [place] male, alert, no distress and comfortable SKIN: skin color, texture, turgor are normal, no rashes or significant lesions EYES: conjunctiva are pink and non-injected, sclera clear NECK: supple, non-tender LYMPH:  no palpable lymphadenopathy in the cervical, axillary or inguinal LUNGS: clear to auscultation and percussion with normal breathing effort HEART: regular rate & rhythm and no murmurs. 1+ pitting edema bilaterally in the lower extremities below the knee. PSYCH: alert & oriented x 3, fluent speech NEURO: no focal motor/sensory deficits  LABORATORY DATA:  I have reviewed the data as listed CBC Latest Ref Rng & Units 11/06/2021 10/27/2021 09/23/2021  WBC 4.0 - 10.5 K/uL 5.9 - 6.7  Hemoglobin 13.0 - 17.0 g/dL 9.4(L) 9.2(L) 8.3(L)  Hematocrit 39.0 - 52.0 % 29.7(L) 27.0(L) 25.8(L)  Platelets 150 - 400 K/uL 169 - 187    CMP Latest Ref Rng & Units 11/06/2021 10/27/2021 09/23/2021  Glucose 70 - 99  mg/dL 351(H) 209(H) 335(H)  BUN 8 - 23 mg/dL 25(H) 19 17  Creatinine 0.61 - 1.24 mg/dL 1.38(H) 1.20 1.31(H)  Sodium 135 - 145 mmol/L 138 140 138  Potassium 3.5 - 5.1 mmol/L 4.5 4.0 4.2  Chloride 98 - 111 mmol/L 104 104 106  CO2 22 - 32 mmol/L 27 - 23  Calcium 8.9 - 10.3 mg/dL 9.0 - 8.4(L)  Total Protein 6.5 - 8.1 g/dL 5.5(L) - 5.1(L)  Total Bilirubin 0.3 - 1.2 mg/dL 0.4 - 0.5  Alkaline Phos 38 - 126 U/L 113 - 108  AST 15 - 41 U/L 28 - 32  ALT 0 - 44 U/L 38 - 43     RADIOGRAPHIC STUDIES: I have personally reviewed the radiological images as listed and agreed with the findings in the report: PET CT scan findings insistent with a complete response to therapy.  Complete resolution of prior FDG avid lymph nodes.  NM PET Image Restag (PS) Skull Base To Thigh  Result Date: 11/04/2021 CLINICAL DATA:  Subsequent treatment strategy for diffuse large B-cell lymphoma. EXAM: NUCLEAR MEDICINE PET SKULL BASE TO THIGH TECHNIQUE: 12.7 mCi F-18 FDG was injected intravenously. Full-ring PET imaging was performed from the skull base to thigh after the radiotracer. CT data was obtained and used for attenuation correction and anatomic localization. Fasting blood glucose: 238 mg/dl COMPARISON:  09/07/2021 FINDINGS: Mediastinal blood pool activity: SUV max 2.5 Liver activity: SUV max 4.2 NECK: No hypermetabolic cervical lymphadenopathy. Incidental CT findings: none CHEST: No hypermetabolic thoracic lymphadenopathy. No suspicious pulmonary nodules. Mild patchy left lower lobe opacity, new from the prior, favoring mild infection/pneumonia. Incidental CT findings: 4.4 cm ascending thoracic aortic aneurysm. Atherosclerotic calcifications of the aortic root/arch. Cardiomegaly. Three vessel coronary atherosclerosis. Hypodense blood pool relative to myocardium, suggesting anemia. ABDOMEN/PELVIS: No abnormal  hypermetabolism in the liver, spleen, pancreas, or adrenal glands. Spleen is normal in size. No hypermetabolic  abdominopelvic lymphadenopathy. Mild hazy jejunal mesentery. Incidental CT findings: Atherosclerotic calcifications abdominal aorta and branch vessels. Left renal sinus cysts. Prostatomegaly. SKELETON: No focal hypermetabolic activity to suggest skeletal metastasis. Multiple healing and healed right anterolateral rib fracture deformities, some of which have residual hypermetabolism. Incidental CT findings: Degenerative changes of the visualized thoracolumbar spine. Right hip arthroplasty, without evidence of complication. IMPRESSION: No suspicious lymphadenopathy in the neck, chest, abdomen, or pelvis. Deauville criteria 1. Mild patchy left lower lobe opacity, new from the prior, favoring mild infection/pneumonia. Multiple healing/healed right anterolateral rib fractures. 4.4 cm ascending thoracic aortic aneurysm, unchanged. Attention on follow-up is suggested in this patient on surveillance imaging. Electronically Signed   By: Julian Hy M.D.   On: 11/04/2021 21:22    ASSESSMENT & PLAN RAJAN BURGARD 75 y.o. male with medical history significant for stage III/IV diffuse large B-cell lymphoma presents for a follow up visit.    After review of the labs, review of the records, and discussion with the patient the patients findings are most consistent with advanced stage diffuse large B-cell lymphoma.  PET scan confirmed widespread disease in the neck, chest, abdomen and pelvis. Additionally, there is evidence of splenomegaly and a 5.4 cm soft tissue lesion along the right lower kidney. Patient does have a cardiac history significant for atrial fibrillation on amiodarone and therefore we may need to consider dropping anthracyclines from his regimen. The treatment consists of R-CEOP based regimen moving forward.  Posttreatment PET CT scan on 11/04/2021 showed complete metabolic response to therapy.  DLBCL IPI Score: 2 points, good prognosis. 80% progression free survival    # Diffuse Large B Cell  Lymphoma, ABC subtype. Stage III/IV -- Findings at this time are most consistent with stage III/IV diffuse large B-cell lymphoma. --Treatment consisted R-CEOP chemotherapy given his cardiac issues and mildly reduced EF(he is on amiodarone for atrial fibrillation) --Started Cycle 1 on 06/09/2021 Plan: --return for labs in 6 weeks time to assure Hgb is trending upward --post treatment PET CT scan showed a complete response.  Will enter surveillance  --Return in 12 weeks for continued evaluation and 6 months for repeat CT scan.   #Chemotherapy Induced Anemia --Hgb 9.4 today, improving.  --continue to monitor  #Hematuria, improved --Likely secondary to anticoagulation (on Eliquis and Plavix), soft tissue lesion along the medial right lower kidney s/p ureteral stent placement. --Hgb is increasing slowly, up to 9.4 today.  --patient holding Plavix, continuing eliquis.   #Lower extremity edema #Leg Wounds  -- Patient has bilateral swelling of the legs with blistering of the right leg --Patient recently overcame a superficial infection on his right leg with antibiotic therapy. --Patient is currently following with the wound center. --Dressed and cleaned wounds today to hold patient over till his next visit with the wound center.  No orders of the defined types were placed in this encounter.  All questions were answered. The patient knows to call the clinic with any problems, questions or concerns.  A total of more than 30 minutes were spent on this encounter with face-to-face time and non-face-to-face time, including preparing to see the patient, ordering tests and/or medications, counseling the patient and coordination of care as outlined above.   Ledell Peoples, MD Department of Hematology/Oncology [place] at [facility] Phone: [phone] Pager: [phone] Email: [email or web address]   11/06/2021 11:27 AM

## 2021-11-12 ENCOUNTER — Other Ambulatory Visit: Payer: Self-pay

## 2021-11-12 ENCOUNTER — Encounter (HOSPITAL_BASED_OUTPATIENT_CLINIC_OR_DEPARTMENT_OTHER): Payer: Medicare HMO | Admitting: Internal Medicine

## 2021-11-12 DIAGNOSIS — I87313 Chronic venous hypertension (idiopathic) with ulcer of bilateral lower extremity: Secondary | ICD-10-CM | POA: Diagnosis not present

## 2021-11-12 NOTE — Progress Notes (Signed)
TELFORD, ARCHAMBEAU ([id number]) Visit Report for 11/12/2021 HPI Details Patient Name: Date of Service: [name] NA TO [name] 11/12/2021 12:30 PM Medical Record Number: [id number] Patient Account Number: [id number] Date of Birth/Sex: Treating RN: 27-Mar-1946 (75 y.o. [name] Primary Care Provider: [name] Other Clinician: Referring Provider: Treating Provider/Extender: Edgardo Roys in Treatment: 4 History of Present Illness HPI Description: ADMISSION 10/13/2021 This is a 75 year old man who is actually from [place] speaking both [place] and [place]. We interviewed him through a [place] interpreter. That seemed to work quite well. The patient has had significant disease this year including diffuse large B-cell lymphoma stage III-IV. He is finished chemotherapy as directed by Dr. Lorenso Courier of oncology. Apparently he has had a good response. During the last visit in follow-up with Dr. Lorenso Courier on 09/23/2021 it was noted that he had bilateral lower extremity ulcers felt to be secondary to edema and the use of steroids as part of his chemotherapy. He is no longer on prednisone. He also has painful areas in the right inguinal area which apparently started as a large blister about 4 to 6 weeks ago and is contracted to 4 small areas according predominantly to the interpreter who seems to have been to most of his appointments. As noted he was in hospital with sepsis in July and had concerns for endocarditis treated with 6 weeks of antibiotics via PICC line. As noted he finished his [facility] E[facility] earlier this month. Past medical history includes diffuse large B-cell lymphoma is noted, atrial fibrillation on Eliquis and he is also on Plavix, lower extremity edema, coronary artery disease, type 2 diabetes. As noted possible endocarditis, sleep apnea and chronic A. fib on amiodarone His ABIs in our clinic were actually quite good at 1.07 and 1.04 10/20/2021; patient  today arrives with a new wound on the left anterior lower leg. The culture I did of the posterior area last week showed methicillin sensitive staph aureus. He states that the area in his right inguinal area is no longer is painful but he has a new open area in the pannus fold above this. I found myself wondering today whether this is all staff aureus related rather than anything more exotic related to his lymphoma or chemotherapy which ended about 3 weeks ago 12/14; the patient only has a left posterior leg and the right anterior lower leg wound from last week. The culture I did last week showed the same as has is a. He has been on cephalexin since 12 6 and everything looks a lot better. In fact the unusual areas in the right groin area have healed. I suspect all of this was methicillin sensitive staph aureus and I went over this with him today 12/22; the patient has 2 open wounds 1 on the right anterior and the one on the left posterior. Both of these had eschar which I removed. These are not closed. His edema control is good but he does not have compression stockings 12/29; he did not allow wrap yet last week. He comes in today with markedly increased edema bilaterally. Several open wounds anteriorly on the right and medially in the upper calf a small area on the left medial calf. We are definitely going to need compression stockings and we talked about this with him today through his [place] Environmental consultant) Signed: 11/12/2021 5:16:25 PM By: Linton Ham MD Entered By: Linton Ham on 11/12/2021 13:05:21 -------------------------------------------------------------------------------- Physical Exam Details Patient Name: Date of Service: GO  [name], A NA TO LIY S. 11/12/2021 12:30 PM Medical Record Number: [id number] Patient Account Number: [id number] Date of Birth/Sex: Treating RN: May 07, 1946 (75 y.o. [name] Primary Care Provider: [name] Other  Clinician: Referring Provider: Treating Provider/Extender: Edgardo Roys in Treatment: 4 Constitutional Sitting or standing Blood Pressure is within target range for patient.. Pulse regular and within target range for patient.Marland Kitchen Respirations regular, non-labored and within target range.. Temperature is normal and within the target range for the patient.Marland Kitchen Appears in no distress. Cardiovascular Pedal pulses Palpable. Notes Wound exam; superficial wounds on the right anterior mid tibia and right medial upper calf. These were undoubtedly blisters. He has a small area on the left posterior medially. He has marked increase in peripheral edema Electronic Signature(s) Signed: 11/12/2021 5:16:25 PM By: Linton Ham MD Entered By: Linton Ham on 11/12/2021 13:06:17 -------------------------------------------------------------------------------- Physician Orders Details Patient Name: Date of Service: [name], A NA TO [name]. 11/12/2021 12:30 PM Medical Record Number: [id number] Patient Account Number: [id number] Date of Birth/Sex: Treating RN: 28-Nov-1945 (75 y.o. [name] Primary Care Provider: [name] Other Clinician: Referring Provider: Treating Provider/Extender: Edgardo Roys in Treatment: 4 Verbal / Phone Orders: No Diagnosis Coding ICD-10 Coding Code Description [phone] Non-pressure chronic ulcer of other part of left lower leg with other specified severity L97.818 Non-pressure chronic ulcer of other part of right lower leg with other specified severity I87.313 Chronic venous hypertension (idiopathic) with ulcer of bilateral lower extremity C83.30 Diffuse large B-cell lymphoma, unspecified site B95.61 Methicillin susceptible Staphylococcus aureus infection as the cause of diseases classified elsewhere Follow-up Appointments ppointment in 1 week. - Dr. Dellia Nims Return A Will need the compression  stockings and bring in weekly once wounds close will apply the stockings in the clinic. Bathing/ Shower/ Hygiene May shower with protection but do not get wound dressing(s) wet. - Ok to use Market researcher, can purchase at [facility], [facility], or [employer] Edema Control - Lymphedema / SCD / Other Elevate legs to the level of the heart or above for 30 minutes daily and/or when sitting, a frequency of: - throughout the day Avoid standing for long periods of time. Exercise regularly Other Edema Control Orders/Instructions: - Will need the compression stockings and bring in weekly once wounds close will apply the stockings in the clinic. Wound Treatment Wound #1 - Lower Leg Wound Laterality: Right, Anterior Cleanser: Soap and Water 1 x Per Week Discharge Instructions: May shower and wash wound with dial antibacterial soap and water prior to dressing change. Peri-Wound Care: Sween Lotion (Moisturizing lotion) 1 x Per Week Discharge Instructions: Apply moisturizing lotion as directed Prim Dressing: KerraCel Ag Gelling Fiber Dressing, 2x2 in (silver alginate) 1 x Per Week ary Discharge Instructions: Apply silver alginate to wound bed as instructed Secondary Dressing: ABD Pad, 8x10 1 x Per Week Discharge Instructions: Apply over primary dressing as directed. Compression Wrap: CoFlex TLC XL 2-layer Compression System 4x7 (in/yd) 1 x Per Week Discharge Instructions: Apply CoFlex 2-layer compression as directed. (alt for 4 layer) Wound #2 - Lower Leg Wound Laterality: Left, Posterior Cleanser: Soap and Water 1 x Per Week Discharge Instructions: May shower and wash wound with dial antibacterial soap and water prior to dressing change. Peri-Wound Care: Sween Lotion (Moisturizing lotion) 1 x Per Week Discharge Instructions: Apply moisturizing lotion as directed Prim Dressing: KerraCel Ag Gelling Fiber Dressing, 2x2 in (silver alginate) 1 x Per Week ary Discharge Instructions: Apply silver alginate to wound bed  as instructed  Secondary Dressing: ABD Pad, 8x10 1 x Per Week Discharge Instructions: Apply over primary dressing as directed. Compression Wrap: CoFlex TLC XL 2-layer Compression System 4x7 (in/yd) 1 x Per Week Discharge Instructions: Apply CoFlex 2-layer compression as directed. (alt for 4 layer) Wound #6 - Lower Leg Wound Laterality: Right, Anterior, Distal Cleanser: Soap and Water 1 x Per Week Discharge Instructions: May shower and wash wound with dial antibacterial soap and water prior to dressing change. Peri-Wound Care: Sween Lotion (Moisturizing lotion) 1 x Per Week Discharge Instructions: Apply moisturizing lotion as directed Prim Dressing: KerraCel Ag Gelling Fiber Dressing, 2x2 in (silver alginate) 1 x Per Week ary Discharge Instructions: Apply silver alginate to wound bed as instructed Secondary Dressing: ABD Pad, 8x10 1 x Per Week Discharge Instructions: Apply over primary dressing as directed. Compression Wrap: CoFlex TLC XL 2-layer Compression System 4x7 (in/yd) 1 x Per Week Discharge Instructions: Apply CoFlex 2-layer compression as directed. (alt for 4 layer) Electronic Signature(s) Signed: 11/12/2021 5:16:25 PM By: Linton Ham MD Signed: 11/12/2021 5:52:00 PM By: Deon Pilling RN, BSN Entered By: Deon Pilling on 11/12/2021 12:56:39 -------------------------------------------------------------------------------- Problem List Details Patient Name: Date of Service: [name], A NA TO LIY S. 11/12/2021 12:30 PM Medical Record Number: [id number] Patient Account Number: [id number] Date of Birth/Sex: Treating RN: 12-Apr-1946 (75 y.o. Lorette Ang, Meta.Reding Primary Care Provider: [name] Other Clinician: Referring Provider: Treating Provider/Extender: Edgardo Roys in Treatment: 4 Active Problems ICD-10 Encounter Code Description Active Date MDM Diagnosis L97.828 Non-pressure chronic ulcer of other part of left lower leg with other  specified 10/13/2021 No Yes severity L97.818 Non-pressure chronic ulcer of other part of right lower leg with other specified 10/13/2021 No Yes severity I87.313 Chronic venous hypertension (idiopathic) with ulcer of bilateral lower extremity 10/13/2021 No Yes C83.30 Diffuse large B-cell lymphoma, unspecified site 10/13/2021 No Yes B95.61 Methicillin susceptible Staphylococcus aureus infection as the cause of 10/20/2021 No Yes diseases classified elsewhere Inactive Problems Resolved Problems ICD-10 Code Description Active Date Resolved Date S31.103D Unspecified open wound of abdominal wall, right lower quadrant without penetration 10/13/2021 10/13/2021 into peritoneal cavity, subsequent encounter Electronic Signature(s) Signed: 11/12/2021 5:16:25 PM By: Linton Ham MD Entered By: Linton Ham on 11/12/2021 13:04:11 -------------------------------------------------------------------------------- Progress Note Details Patient Name: Date of Service: [name], A NA TO Tonye Royalty S. 11/12/2021 12:30 PM Medical Record Number: [id number] Patient Account Number: [id number] Date of Birth/Sex: Treating RN: 1946/01/30 (75 y.o. [name] Primary Care Provider: [name] Other Clinician: Referring Provider: Treating Provider/Extender: Edgardo Roys in Treatment: 4 Subjective History of Present Illness (HPI) ADMISSION 10/13/2021 This is a 75 year old man who is actually from [place] speaking both [place] and [place]. We interviewed him through a [place] interpreter. That seemed to work quite well. The patient has had significant disease this year including diffuse large B-cell lymphoma stage III-IV. He is finished chemotherapy as directed by Dr. Lorenso Courier of oncology. Apparently he has had a good response. During the last visit in follow-up with Dr. Lorenso Courier on 09/23/2021 it was noted that he had bilateral lower extremity ulcers felt to be secondary to  edema and the use of steroids as part of his chemotherapy. He is no longer on prednisone. He also has painful areas in the right inguinal area which apparently started as a large blister about 4 to 6 weeks ago and is contracted to 4 small areas according predominantly to the interpreter who seems to have been to most of his appointments. As noted he was  in hospital with sepsis in July and had concerns for endocarditis treated with 6 weeks of antibiotics via PICC line. As noted he finished his [facility] E[facility] earlier this month. Past medical history includes diffuse large B-cell lymphoma is noted, atrial fibrillation on Eliquis and he is also on Plavix, lower extremity edema, coronary artery disease, type 2 diabetes. As noted possible endocarditis, sleep apnea and chronic A. fib on amiodarone His ABIs in our clinic were actually quite good at 1.07 and 1.04 10/20/2021; patient today arrives with a new wound on the left anterior lower leg. The culture I did of the posterior area last week showed methicillin sensitive staph aureus. He states that the area in his right inguinal area is no longer is painful but he has a new open area in the pannus fold above this. I found myself wondering today whether this is all staff aureus related rather than anything more exotic related to his lymphoma or chemotherapy which ended about 3 weeks ago 12/14; the patient only has a left posterior leg and the right anterior lower leg wound from last week. The culture I did last week showed the same as has is a. He has been on cephalexin since 12 6 and everything looks a lot better. In fact the unusual areas in the right groin area have healed. I suspect all of this was methicillin sensitive staph aureus and I went over this with him today 12/22; the patient has 2 open wounds 1 on the right anterior and the one on the left posterior. Both of these had eschar which I removed. These are not closed. His edema control is good but he  does not have compression stockings 12/29; he did not allow wrap yet last week. He comes in today with markedly increased edema bilaterally. Several open wounds anteriorly on the right and medially in the upper calf a small area on the left medial calf. We are definitely going to need compression stockings and we talked about this with him today through his [place] translator Objective Constitutional Sitting or standing Blood Pressure is within target range for patient.. Pulse regular and within target range for patient.Marland Kitchen Respirations regular, non-labored and within target range.. Temperature is normal and within the target range for the patient.Marland Kitchen Appears in no distress. Vitals Time Taken: 12:30 PM, Temperature: 97.6 F, Pulse: 66 bpm, Respiratory Rate: 20 breaths/min, Blood Pressure: 144/78 mmHg. Cardiovascular Pedal pulses Palpable. General Notes: Wound exam; superficial wounds on the right anterior mid tibia and right medial upper calf. These were undoubtedly blisters. He has a small area on the left posterior medially. He has marked increase in peripheral edema Integumentary (Hair, Skin) Wound #1 status is Open. Original cause of wound was Gradually Appeared. The date acquired was: 07/16/2021. The wound has been in treatment 4 weeks. The wound is located on the Right,Anterior Lower Leg. The wound measures 4cm length x 1cm width x 0.1cm depth; 3.142cm^2 area and 0.314cm^3 volume. There is Fat Layer (Subcutaneous Tissue) exposed. There is no tunneling or undermining noted. There is a medium amount of serosanguineous drainage noted. The wound margin is flat and intact. There is large (67-100%) pink, pale granulation within the wound bed. There is no necrotic tissue within the wound bed. Wound #2 status is Open. Original cause of wound was Gradually Appeared. The date acquired was: 09/28/2021. The wound has been in treatment 4 weeks. The wound is located on the Left,Posterior Lower Leg. The wound  measures 0.1cm length x 0.1cm width x  0.1cm depth; 0.008cm^2 area and 0.001cm^3 volume. There is Fat Layer (Subcutaneous Tissue) exposed. There is no tunneling or undermining noted. There is a none present amount of drainage noted. The wound margin is flat and intact. There is no granulation within the wound bed. There is a large (67-100%) amount of necrotic tissue within the wound bed including Eschar. General Notes: scabbed over. Wound #6 status is Open. Original cause of wound was Blister. The date acquired was: 10/11/2021. The wound is located on the Right,Distal,Anterior Lower Leg. The wound measures 2cm length x 0.9cm width x 0.1cm depth; 1.414cm^2 area and 0.141cm^3 volume. There is Fat Layer (Subcutaneous Tissue) exposed. There is no tunneling or undermining noted. There is a medium amount of serosanguineous drainage noted. The wound margin is distinct with the outline attached to the wound base. There is large (67-100%) pink, pale granulation within the wound bed. There is no necrotic tissue within the wound bed. 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 There was a Double Layer Compression Therapy Procedure by Deon Pilling, RN. Post procedure Diagnosis Wound #1: Same as Pre-Procedure Wound #2 Pre-procedure diagnosis of Wound #2 is a Venous Leg Ulcer located on the Left,Posterior Lower Leg . There was a Double Layer Compression Therapy Procedure by Deon Pilling,  RN. Post procedure Diagnosis Wound #2: Same as Pre-Procedure Wound #6 Pre-procedure diagnosis of Wound #6 is a Venous Leg Ulcer located on the Right,Distal,Anterior Lower Leg . There was a Double Layer Compression Therapy Procedure by Deon Pilling, RN. Post procedure Diagnosis Wound #6: Same as Pre-Procedure Plan Follow-up Appointments: Return Appointment in 1 week. - Dr. Dellia Nims Will need the compression stockings and bring in weekly once wounds close will apply the stockings in the clinic. Bathing/ Shower/ Hygiene: May shower with protection but do not get wound dressing(s) wet. - Ok to use Market researcher, can purchase at [facility], [facility], or [employer] Edema Control - Lymphedema / SCD / Other: Elevate legs to the level of the heart or above for 30 minutes daily and/or when sitting, a frequency of: - throughout the day Avoid standing for long periods of time. Exercise regularly Other Edema Control Orders/Instructions: - Will need the compression stockings and bring in weekly once wounds close will apply the stockings in the clinic. WOUND #1: - Lower Leg Wound Laterality: Right, Anterior Cleanser: Soap and Water 1 x Per Week/ Discharge Instructions: May shower and wash wound with dial antibacterial soap and water prior to dressing change. Peri-Wound Care: Sween Lotion (Moisturizing lotion) 1 x Per Week/ Discharge Instructions: Apply moisturizing lotion as directed Prim Dressing: KerraCel Ag Gelling Fiber Dressing, 2x2 in (silver alginate) 1 x Per Week/ ary Discharge Instructions: Apply silver alginate to wound bed as instructed Secondary Dressing: ABD Pad, 8x10 1 x Per Week/ Discharge Instructions: Apply over primary dressing as directed. Com pression Wrap: CoFlex TLC XL 2-layer Compression System 4x7 (in/yd) 1 x Per Week/ Discharge Instructions: Apply CoFlex 2-layer compression as directed. (alt for 4 layer) WOUND #2: - Lower Leg Wound Laterality: Left, Posterior Cleanser: Soap and Water 1 x  Per  Week/ Discharge Instructions: May shower and wash wound with dial antibacterial soap and water prior to dressing change. Peri-Wound Care: Sween Lotion (Moisturizing lotion) 1 x Per Week/ Discharge Instructions: Apply moisturizing lotion as directed Prim Dressing: KerraCel Ag Gelling Fiber Dressing, 2x2 in (silver alginate) 1 x Per Week/ ary Discharge Instructions: Apply silver alginate to wound bed as instructed Secondary Dressing: ABD Pad, 8x10 1 x Per Week/ Discharge Instructions: Apply over primary dressing as directed. Com pression Wrap: CoFlex TLC XL 2-layer Compression System 4x7 (in/yd) 1 x Per Week/ Discharge Instructions: Apply CoFlex 2-layer compression as directed. (alt for 4 layer) WOUND #6: - Lower Leg Wound Laterality: Right, Anterior, Distal Cleanser: Soap and Water 1 x Per Week/ Discharge Instructions: May shower and wash wound with dial antibacterial soap and water prior to dressing change. Peri-Wound Care: Sween Lotion (Moisturizing lotion) 1 x Per Week/ Discharge Instructions: Apply moisturizing lotion as directed Prim Dressing: KerraCel Ag Gelling Fiber Dressing, 2x2 in (silver alginate) 1 x Per Week/ ary Discharge Instructions: Apply silver alginate to wound bed as instructed Secondary Dressing: ABD Pad, 8x10 1 x Per Week/ Discharge Instructions: Apply over primary dressing as directed. Com pression Wrap: CoFlex TLC XL 2-layer Compression System 4x7 (in/yd) 1 x Per Week/ Discharge Instructions: Apply CoFlex 2-layer compression as directed. (alt for 4 layer) 1. I am continuing with silver alginate but this time under Coflex 2 compression 2. We told him you will need to call elastic therapy and order compression stockings 20/30 mmHg. I am hopeful this will be adequate 3. Also talked to him about leg elevation when he is sitting for prolonged period Electronic Signature(s) Signed: 11/12/2021 5:16:25 PM By: Linton Ham MD Entered By: Linton Ham on 11/12/2021  13:07:07 -------------------------------------------------------------------------------- SuperBill Details Patient Name: Date of Service: [name], A NA TO [name] 11/12/2021 Medical Record Number: [id number] Patient Account Number: [id number] Date of Birth/Sex: Treating RN: 1946/09/25 (75 y.o. [name] Primary Care Provider: [name] Other Clinician: Referring Provider: Treating Provider/Extender: Edgardo Roys in Treatment: 4 Diagnosis Coding ICD-10 Codes Code Description [phone] Non-pressure chronic ulcer of other part of left lower leg with other specified severity L97.818 Non-pressure chronic ulcer of other part of right lower leg with other specified severity I87.313 Chronic venous hypertension (idiopathic) with ulcer of bilateral lower extremity C83.30 Diffuse large B-cell lymphoma, unspecified site B95.61 Methicillin susceptible Staphylococcus aureus infection as the cause of diseases classified elsewhere Facility Procedures Physician Procedures : CPT4 Code Description Modifier [id number] - WC PHYS LEVEL 3 - EST PT ICD-10 Diagnosis Description L97.818 Non-pressure chronic ulcer of other part of right lower leg with other specified severity L97.828 Non-pressure chronic ulcer of other part of  left lower leg with other specified severity Quantity: 1 Electronic Signature(s) Signed: 11/12/2021 5:16:25 PM By: Linton Ham MD Entered By: Linton Ham on 11/12/2021 13:07:36

## 2021-11-12 NOTE — Progress Notes (Signed)
Stanley, [name] ([id number]) Visit Report for 11/12/2021 Arrival Information Details Patient Name: Date of Service: [name] NA TO [name] 11/12/2021 12:30 PM Medical Record Number: [id number] Patient Account Number: [id number] Date of Birth/Sex: Treating RN: [name] (75 y.o. [name] Primary Care [name]: [name] Other Clinician: Referring [name]: Treating [name]/Extender: [name] in Treatment: 4 Visit Information History Since Last Visit Added or deleted any medications: No Patient Arrived: Ambulatory Any new allergies or adverse reactions: No Arrival Time: 12:33 Had a fall or experienced change in No Accompanied By: interpreter activities of daily living that may affect Transfer Assistance: None risk of falls: Patient Identification Verified: Yes Signs or symptoms of abuse/neglect since last visito No Secondary Verification Process Completed: Yes Hospitalized since last visit: No Patient Requires Transmission-Based Precautions: No Implantable device outside of the clinic excluding No Patient Has Alerts: Yes cellular tissue based products placed in the center Patient Alerts: Patient on Blood Thinner since last visit: Has Dressing in Place as Prescribed: No Has Compression in Place as Prescribed: No Pain Present Now: No Notes per interpreter cancer center apply kerlix and coban to BLE. Noted xeroform to wounds by cancer center. Per interpreter patient did not order compression stockings related to both legs edematous the following day and would not be able to apply compression stockings when they arrived. Leroi Haque made aware. Electronic Signature(s) Signed: 11/12/2021 5:52:00 PM By: Stanley Pilling RN, BSN Entered By: [name] on 11/12/2021 12:45:35 -------------------------------------------------------------------------------- Compression Therapy Details Patient Name: Date of Service: [name], A NA TO  Stanley S. 11/12/2021 12:30 PM Medical Record Number: [id number] Patient Account Number: [id number] Date of Birth/Sex: Treating RN: [name] (75 y.o. [name] Primary Care Sweet Jarvis: [name] Other Clinician: Referring Maxima Skelton: Treating Jamarius Saha/Extender: [name] in Treatment: 4 Compression Therapy Performed for Wound Assessment: Wound #1 Right,Anterior Lower Leg Performed By: Clinician Stanley Pilling, RN Compression Type: Double Layer Post Procedure Diagnosis Same as Pre-procedure Electronic Signature(s) Signed: 11/12/2021 5:52:00 PM By: Stanley Pilling RN, BSN Entered By: [name] on 11/12/2021 12:54:07 -------------------------------------------------------------------------------- Compression Therapy Details Patient Name: Date of Service: [name] NA TO Stanley Royalty S. 11/12/2021 12:30 PM Medical Record Number: [id number] Patient Account Number: [id number] Date of Birth/Sex: Treating RN: [birth year] [name] (75 y.o. [name] Primary Care Kyung Muto: [name] Other Clinician: Referring Talayla Doyel: Treating Amali Uhls/Extender: [name] in Treatment: 4 Compression Therapy Performed for Wound Assessment: Wound #6 Right,Distal,Anterior Lower Leg Performed By: Clinician Stanley Pilling, RN Compression Type: Double Layer Post Procedure Diagnosis Same as Pre-procedure Electronic Signature(s) Signed: 11/12/2021 5:52:00 PM By: Stanley Pilling RN, BSN Entered By: [name] on 11/12/2021 12:54:07 -------------------------------------------------------------------------------- Compression Therapy Details Patient Name: Date of Service: [name] NA TO Stanley Royalty S. 11/12/2021 12:30 PM Medical Record Number: [id number] Patient Account Number: [id number] Date of Birth/Sex: Treating RN: [birth year][name] (75 y.o. [name] Primary Care Joesphine Schemm: [name] Other Clinician: Referring  Shalan Neault: Treating Shayden Gingrich/Extender: [name] in Treatment: 4 Compression Therapy Performed for Wound Assessment: Wound #2 Left,Posterior Lower Leg Performed By: Clinician Stanley Pilling, RN Compression Type: Double Layer Post Procedure Diagnosis Same as Pre-procedure Electronic Signature(s) Signed: 11/12/2021 5:52:00 PM By: Stanley Pilling RN, BSN Entered By: [name] on 11/12/2021 12:54:07 -------------------------------------------------------------------------------- Encounter Discharge Information Details Patient Name: Date of Service: [name], A NA TO [name]. 11/12/2021 12:30 PM Medical Record Number: [id number] Patient Account Number: [id number] Date of Birth/Sex: Treating RN: [birth year] [name] (75 y.o. M)  [name] Primary Care Athanasia Stanwood: [name] Other Clinician: Referring Shauntay Brunelli: Treating Eli Adami/Extender: [name] in Treatment: 4 Encounter Discharge Information Items Discharge Condition: Stable Ambulatory Status: Cane Discharge Destination: Home Transportation: Private Auto Accompanied By: interpreter Schedule Follow-up Appointment: Yes Clinical Summary of Care: Electronic Signature(s) Signed: 11/12/2021 5:52:00 PM By: Stanley Pilling RN, BSN Entered By: [name] on 11/12/2021 13:11:12 -------------------------------------------------------------------------------- Lower Extremity Assessment Details Patient Name: Date of Service: [name], A NA TO Stanley S. 11/12/2021 12:30 PM Medical Record Number: [id number] Patient Account Number: [id number] Date of Birth/Sex: Treating RN: [name] (75 y.o. [name] Primary Care Demesha Boorman: [name] Other Clinician: Referring Ferdinand Revoir: Treating Ikia Cincotta/Extender: [name] in Treatment: 4 Edema Assessment Assessed: Shirlyn Goltz: Yes] [Right: Yes] Edema: [Left: Yes] [Right: Yes] Calf Left:  Right: Point of Measurement: 30 cm From Medial Instep 43.5 cm 42.5 cm Ankle Left: Right: Point of Measurement: 11 cm From Medial Instep 28 cm 27.8 cm Vascular Assessment Pulses: Dorsalis Pedis Palpable: [Left:Yes] [Right:Yes] Electronic Signature(s) Signed: 11/12/2021 5:52:00 PM By: Stanley Pilling RN, BSN Entered By: [name] on 11/12/2021 12:46:19 -------------------------------------------------------------------------------- Multi Wound Chart Details Patient Name: Date of Service: [name], A NA TO Stanley Royalty S. 11/12/2021 12:30 PM Medical Record Number: [id number] Patient Account Number: [id number] Date of Birth/Sex: Treating RN: [birth year][name] (75 y.o. Lorette Ang, Meta.Reding Primary Care Yoniel Arkwright: [name] Other Clinician: Referring Hudsyn Champine: Treating Alayssa Flinchum/Extender: [name] in Treatment: 4 Vital Signs Height(in): Pulse(bpm): 82 Weight(lbs): Blood Pressure(mmHg): 144/78 Body Mass Index(BMI): Temperature(F): 97.6 Respiratory Rate(breaths/min): 20 Photos: Right, Anterior Lower Leg Left, Posterior Lower Leg Right, Distal, Anterior Lower Leg Wound Location: Gradually Appeared Gradually Appeared Blister Wounding Event: Venous Leg Ulcer Venous Leg Ulcer Venous Leg Ulcer Primary Etiology: N/A N/A Lymphedema Secondary Etiology: Sleep Apnea, Arrhythmia, Coronary Sleep Apnea, Arrhythmia, Coronary Sleep Apnea, Arrhythmia, Coronary Comorbid History: Artery Disease, Hypertension, Type II Artery Disease, Hypertension, Type II Artery Disease, Hypertension, Type II Diabetes, Received Chemotherapy Diabetes, Received Chemotherapy Diabetes, Received Chemotherapy 07/16/2021 09/28/2021 10/11/2021 Date Acquired: 4 4 0 Weeks of Treatment: Open Open Open Wound Status: Yes Yes No Clustered Wound: 2 1 N/A Clustered Quantity: 4x1x0.1 0.1x0.1x0.1 2x0.9x0.1 Measurements L x W x D (cm) 3.142 0.008 1.414 A (cm) : rea 0.314 0.001 0.141 Volume  (cm) : -233.50% 99.70% 0.00% % Reduction in Area: -234.00% 99.60% 0.00% % Reduction in Volume: Full Thickness Without Exposed Full Thickness Without Exposed Full Thickness Without Exposed Classification: Support Structures Support Structures Support Structures Medium None Present Medium Exudate A mount: Serosanguineous N/A Serosanguineous Exudate Type: red, brown N/A red, brown Exudate Color: Flat and Intact Flat and Intact Distinct, outline attached Wound Margin: Large (67-100%) None Present (0%) Large (67-100%) Granulation Amount: Pink, Pale N/A Pink, Pale Granulation Quality: None Present (0%) Large (67-100%) None Present (0%) Necrotic Amount: N/A Eschar N/A Necrotic Tissue: Fat Layer (Subcutaneous Tissue): Yes Fat Layer (Subcutaneous Tissue): Yes Fat Layer (Subcutaneous Tissue): Yes Exposed Structures: Fascia: No Fascia: No Fascia: No Tendon: No Tendon: No Tendon: No Muscle: No Muscle: No Muscle: No Joint: No Joint: No Joint: No Bone: No Bone: No Bone: No Small (1-33%) Large (67-100%) None Epithelialization: N/A scabbed over. N/A Assessment Notes: Compression Therapy Compression Therapy Compression Therapy Procedures Performed: Treatment Notes Electronic Signature(s) Signed: 11/12/2021 5:16:25 PM By: Linton Ham MD Signed: 11/12/2021 5:52:00 PM By: Stanley Pilling RN, BSN Entered By: Linton Ham on 11/12/2021 13:04:17 -------------------------------------------------------------------------------- Multi-Disciplinary Care Plan Details Patient Name: Date of Service: [name], A NA TO Stanley Royalty  S. 11/12/2021 12:30 PM Medical Record Number: [id number] Patient Account Number: [id number] Date of Birth/Sex: Treating RN: [name][birth year] (75 y.o. [name] Primary Care Joann Jorge: [name] Other Clinician: Referring Lynnette Pote: Treating Cloa Bushong/Extender: [name] in Treatment: 4 Multidisciplinary Care Plan  reviewed with physician Active Inactive Abuse / Safety / Falls / Self Care Management Nursing Diagnoses: History of Falls Potential for falls Potential for injury related to falls Goals: Patient will not experience any injury related to falls Date Initiated: 10/13/2021 Target Resolution Date: 12/11/2021 Goal Status: Active Patient/caregiver will verbalize/demonstrate measures taken to prevent injury and/or falls Date Initiated: 10/13/2021 Target Resolution Date: 12/11/2021 Goal Status: Active Interventions: Assess Activities of Daily Living upon admission and as needed Assess fall risk on admission and as needed Assess: immobility, friction, shearing, incontinence upon admission and as needed Assess impairment of mobility on admission and as needed per policy Assess personal safety and home safety (as indicated) on admission and as needed Assess self care needs on admission and as needed Provide education on fall prevention Provide education on personal and home safety Notes: Nutrition Nursing Diagnoses: Impaired glucose control: actual or potential Potential for alteratiion in Nutrition/Potential for imbalanced nutrition Goals: Patient/caregiver agrees to and verbalizes understanding of need to use nutritional supplements and/or vitamins as prescribed Date Initiated: 10/13/2021 Target Resolution Date: 12/11/2021 Goal Status: Active Patient/caregiver will maintain therapeutic glucose control Date Initiated: 10/13/2021 Target Resolution Date: 12/10/2021 Goal Status: Active Interventions: Assess HgA1c results as ordered upon admission and as needed Assess patient nutrition upon admission and as needed per policy Provide education on elevated blood sugars and impact on wound healing Provide education on nutrition Treatment Activities: Education provided on Nutrition : 10/13/2021 Notes: Wound/Skin Impairment Nursing Diagnoses: Impaired tissue integrity Knowledge deficit  related to ulceration/compromised skin integrity Goals: Patient/caregiver will verbalize understanding of skin care regimen Date Initiated: 10/13/2021 Target Resolution Date: 12/11/2021 Goal Status: Active Interventions: Assess patient/caregiver ability to obtain necessary supplies Assess patient/caregiver ability to perform ulcer/skin care regimen upon admission and as needed Assess ulceration(s) every visit Provide education on ulcer and skin care Notes: Electronic Signature(s) Signed: 11/12/2021 5:52:00 PM By: Stanley Pilling RN, BSN Entered By: [name] on 11/12/2021 12:50:03 -------------------------------------------------------------------------------- Pain Assessment Details Patient Name: Date of Service: [name] NA TO Stanley Royalty S. 11/12/2021 12:30 PM Medical Record Number: [id number] Patient Account Number: [id number] Date of Birth/Sex: Treating RN: [birth year] [name] (75 y.o. [name] Primary Care Joana Nolton: [name] Other Clinician: Referring Ilze Roselli: Treating Burnice Vassel/Extender: [name] in Treatment: 4 Active Problems Location of Pain Severity and Description of Pain Patient Has Paino No Site Locations Rate the pain. Current Pain Level: 0 Pain Management and Medication Current Pain Management: Medication: No Cold Application: No Rest: No Massage: No Activity: No T.E.N.S.: No Heat Application: No Leg drop or elevation: No Is the Current Pain Management Adequate: Adequate How does your wound impact your activities of daily livingo Sleep: No Bathing: No Appetite: No Relationship With Others: No Bladder Continence: No Emotions: No Bowel Continence: No Work: No Toileting: No Drive: No Dressing: No Hobbies: No Electronic Signature(s) Signed: 11/12/2021 5:52:00 PM By: Stanley Pilling RN, BSN Entered By: [name] on 11/12/2021  12:33:55 -------------------------------------------------------------------------------- Patient/Caregiver Education Details Patient Name: Date of Service: [name] NA TO [name] 12/29/2022andnbsp12:30 PM Medical Record Number: [id number] Patient Account Number: [id number] Date of Birth/Gender: Treating RN: [birth year] [name] (75 y.o. [name] Primary Care Physician: [name] Other Clinician: Referring Physician: Treating Physician/Extender: Dellia Nims  Paulene Floor Weeks in Treatment: 4 Education Assessment Education Provided To: Patient Education Topics Provided Venous: Handouts: Controlling Swelling with Compression Stockings , Controlling Swelling with Multilayered Compression Wraps, Managing Venous Disease and Related Ulcers Methods: Explain/Verbal Responses: Reinforcements needed Electronic Signature(s) Signed: 11/12/2021 5:52:00 PM By: Stanley Pilling RN, BSN Entered By: [name] on 11/12/2021 12:50:20 -------------------------------------------------------------------------------- Wound Assessment Details Patient Name: Date of Service: [name], A NA TO Stanley Royalty S. 11/12/2021 12:30 PM Medical Record Number: [id number] Patient Account Number: [id number] Date of Birth/Sex: Treating RN: [birth year] (75 y.o. [name] Primary Care Deirdre Gryder: [name] Other Clinician: Referring Dorenda Pfannenstiel: Treating Esley Brooking/Extender: [name] in Treatment: 4 Wound Status Wound Number: 1 Primary Venous Leg Ulcer Etiology: Wound Location: Right, Anterior Lower Leg Wound Open Wounding Event: Gradually Appeared Status: Date Acquired: 07/16/2021 Comorbid Sleep Apnea, Arrhythmia, Coronary Artery Disease, Hypertension, Weeks Of Treatment: 4 History: Type II Diabetes, Received Chemotherapy Clustered Wound: Yes Photos Wound Measurements Length: (cm) 4 Width: (cm) 1 Depth: (cm) 0.1 Clustered Quantity: 2 Area: (cm)  3.142 Volume: (cm) 0.314 % Reduction in Area: -233.5% % Reduction in Volume: -234% Epithelialization: Small (1-33%) Tunneling: No Undermining: No Wound Description Classification: Full Thickness Without Exposed Support Structures Wound Margin: Flat and Intact Exudate Amount: Medium Exudate Type: Serosanguineous Exudate Color: red, brown Wound Bed Granulation Amount: Large (67-100%) Granulation Quality: Pink, Pale Necrotic Amount: None Present (0%) Foul Odor After Cleansing: No Slough/Fibrino No Exposed Structure Fascia Exposed: No Fat Layer (Subcutaneous Tissue) Exposed: Yes Tendon Exposed: No Muscle Exposed: No Joint Exposed: No Bone Exposed: No Treatment Notes Wound #1 (Lower Leg) Wound Laterality: Right, Anterior Cleanser Soap and Water Discharge Instruction: May shower and wash wound with dial antibacterial soap and water prior to dressing change. Peri-Wound Care Sween Lotion (Moisturizing lotion) Discharge Instruction: Apply moisturizing lotion as directed Topical Primary Dressing KerraCel Ag Gelling Fiber Dressing, 2x2 in (silver alginate) Discharge Instruction: Apply silver alginate to wound bed as instructed Secondary Dressing ABD Pad, 8x10 Discharge Instruction: Apply over primary dressing as directed. Secured With Compression Wrap CoFlex TLC XL 2-layer Compression System 4x7 (in/yd) Discharge Instruction: Apply CoFlex 2-layer compression as directed. (alt for 4 layer) Compression Stockings Add-Ons Electronic Signature(s) Signed: 11/12/2021 5:52:00 PM By: Stanley Pilling RN, BSN Entered By: [name] on 11/12/2021 12:49:09 -------------------------------------------------------------------------------- Wound Assessment Details Patient Name: Date of Service: [name], A NA TO Stanley S. 11/12/2021 12:30 PM Medical Record Number: [id number] Patient Account Number: [id number] Date of Birth/Sex: Treating RN: 10-Stanley-[name] (75 y.o. [name] Primary Care  Marguetta Windish: [name] Other Clinician: Referring Lot Medford: Treating Jhonatan Lomeli/Extender: [name] in Treatment: 4 Wound Status Wound Number: 2 Primary Venous Leg Ulcer Etiology: Wound Location: Left, Posterior Lower Leg Wound Open Wounding Event: Gradually Appeared Status: Date Acquired: 09/28/2021 Comorbid Sleep Apnea, Arrhythmia, Coronary Artery Disease, Hypertension, Weeks Of Treatment: 4 History: Type II Diabetes, Received Chemotherapy Clustered Wound: Yes Photos Wound Measurements Length: (cm) 0.1 Width: (cm) 0.1 Depth: (cm) 0.1 Clustered Quantity: 1 Area: (cm) 0.008 Volume: (cm) 0.001 % Reduction in Area: 99.7% % Reduction in Volume: 99.6% Epithelialization: Large (67-100%) Tunneling: No Undermining: No Wound Description Classification: Full Thickness Without Exposed Support Structures Wound Margin: Flat and Intact Exudate Amount: None Present Foul Odor After Cleansing: No Slough/Fibrino No Wound Bed Granulation Amount: None Present (0%) Exposed Structure Necrotic Amount: Large (67-100%) Fascia Exposed: No Necrotic Quality: Eschar Fat Layer (Subcutaneous Tissue) Exposed: Yes Tendon Exposed: No Muscle Exposed: No Joint Exposed: No Bone Exposed: No  Assessment Notes scabbed over. Treatment Notes Wound #2 (Lower Leg) Wound Laterality: Left, Posterior Cleanser Soap and Water Discharge Instruction: May shower and wash wound with dial antibacterial soap and water prior to dressing change. Peri-Wound Care Sween Lotion (Moisturizing lotion) Discharge Instruction: Apply moisturizing lotion as directed Topical Primary Dressing KerraCel Ag Gelling Fiber Dressing, 2x2 in (silver alginate) Discharge Instruction: Apply silver alginate to wound bed as instructed Secondary Dressing ABD Pad, 8x10 Discharge Instruction: Apply over primary dressing as directed. Secured With Compression Wrap CoFlex TLC XL 2-layer  Compression System 4x7 (in/yd) Discharge Instruction: Apply CoFlex 2-layer compression as directed. (alt for 4 layer) Compression Stockings Add-Ons Electronic Signature(s) Signed: 11/12/2021 5:52:00 PM By: Stanley Pilling RN, BSN Entered By: [name] on 11/12/2021 12:49:41 -------------------------------------------------------------------------------- Wound Assessment Details Patient Name: Date of Service: [name], A NA TO Stanley S. 11/12/2021 12:30 PM Medical Record Number: [id number] Patient Account Number: [id number] Date of Birth/Sex: Treating RN: [birth year] [name] (75 y.o. [name] Primary Care Earlisha Sharples: [name] Other Clinician: Referring Marleigh Kaylor: Treating Kafi Dotter/Extender: [name] in Treatment: 4 Wound Status Wound Number: 6 Primary Venous Leg Ulcer Etiology: Wound Location: Right, Distal, Anterior Lower Leg Secondary Lymphedema Wounding Event: Blister Etiology: Date Acquired: 10/11/2021 Wound Open Weeks Of Treatment: 0 Status: Clustered Wound: No Comorbid Sleep Apnea, Arrhythmia, Coronary Artery Disease, History: Hypertension, Type II Diabetes, Received Chemotherapy Photos Wound Measurements Length: (cm) 2 Width: (cm) 0.9 Depth: (cm) 0.1 Area: (cm) 1.414 Volume: (cm) 0.141 % Reduction in Area: 0% % Reduction in Volume: 0% Epithelialization: None Tunneling: No Undermining: No Wound Description Classification: Full Thickness Without Exposed Support Structures Wound Margin: Distinct, outline attached Exudate Amount: Medium Exudate Type: Serosanguineous Exudate Color: red, brown Foul Odor After Cleansing: No Slough/Fibrino No Wound Bed Granulation Amount: Large (67-100%) Exposed Structure Granulation Quality: Pink, Pale Fascia Exposed: No Necrotic Amount: None Present (0%) Fat Layer (Subcutaneous Tissue) Exposed: Yes Tendon Exposed: No Muscle Exposed: No Joint Exposed: No Bone Exposed:  No Treatment Notes Wound #6 (Lower Leg) Wound Laterality: Right, Anterior, Distal Cleanser Soap and Water Discharge Instruction: May shower and wash wound with dial antibacterial soap and water prior to dressing change. Peri-Wound Care Sween Lotion (Moisturizing lotion) Discharge Instruction: Apply moisturizing lotion as directed Topical Primary Dressing KerraCel Ag Gelling Fiber Dressing, 2x2 in (silver alginate) Discharge Instruction: Apply silver alginate to wound bed as instructed Secondary Dressing ABD Pad, 8x10 Discharge Instruction: Apply over primary dressing as directed. Secured With Compression Wrap CoFlex TLC XL 2-layer Compression System 4x7 (in/yd) Discharge Instruction: Apply CoFlex 2-layer compression as directed. (alt for 4 layer) Compression Stockings Add-Ons Electronic Signature(s) Signed: 11/12/2021 5:52:00 PM By: Stanley Pilling RN, BSN Entered By: [name] on 11/12/2021 12:50:44 -------------------------------------------------------------------------------- Vitals Details Patient Name: Date of Service: [name], A NA TO Stanley S. 11/12/2021 12:30 PM Medical Record Number: [id number] Patient Account Number: [id number] Date of Birth/Sex: Treating RN: [birth year][name] (75 y.o. [name] Primary Care Cyris Maalouf: [name] Other Clinician: Referring Teddi Badalamenti: Treating Michale Weikel/Extender: [name] in Treatment: 4 Vital Signs Time Taken: 12:30 Temperature (F): 97.6 Pulse (bpm): 66 Respiratory Rate (breaths/min): 20 Blood Pressure (mmHg): 144/78 Reference Range: 80 - 120 mg / dl Electronic Signature(s) Signed: 11/12/2021 5:52:00 PM By: Stanley Pilling RN, BSN Entered By: [name] on 11/12/2021 12:33:47

## 2021-11-19 ENCOUNTER — Other Ambulatory Visit: Payer: Self-pay

## 2021-11-19 ENCOUNTER — Other Ambulatory Visit: Payer: Self-pay | Admitting: Hematology and Oncology

## 2021-11-19 ENCOUNTER — Encounter (HOSPITAL_BASED_OUTPATIENT_CLINIC_OR_DEPARTMENT_OTHER): Payer: Medicare HMO | Attending: Internal Medicine | Admitting: Internal Medicine

## 2021-11-19 DIAGNOSIS — I779 Disorder of arteries and arterioles, unspecified: Secondary | ICD-10-CM | POA: Insufficient documentation

## 2021-11-19 DIAGNOSIS — E11622 Type 2 diabetes mellitus with other skin ulcer: Secondary | ICD-10-CM | POA: Insufficient documentation

## 2021-11-19 DIAGNOSIS — C8330 Diffuse large B-cell lymphoma, unspecified site: Secondary | ICD-10-CM | POA: Insufficient documentation

## 2021-11-19 DIAGNOSIS — I2510 Atherosclerotic heart disease of native coronary artery without angina pectoris: Secondary | ICD-10-CM | POA: Diagnosis not present

## 2021-11-19 DIAGNOSIS — Z7902 Long term (current) use of antithrombotics/antiplatelets: Secondary | ICD-10-CM | POA: Insufficient documentation

## 2021-11-19 DIAGNOSIS — I4891 Unspecified atrial fibrillation: Secondary | ICD-10-CM | POA: Diagnosis not present

## 2021-11-19 DIAGNOSIS — L97818 Non-pressure chronic ulcer of other part of right lower leg with other specified severity: Secondary | ICD-10-CM | POA: Insufficient documentation

## 2021-11-19 DIAGNOSIS — I10 Essential (primary) hypertension: Secondary | ICD-10-CM | POA: Insufficient documentation

## 2021-11-19 DIAGNOSIS — Z7901 Long term (current) use of anticoagulants: Secondary | ICD-10-CM | POA: Insufficient documentation

## 2021-11-19 DIAGNOSIS — L97828 Non-pressure chronic ulcer of other part of left lower leg with other specified severity: Secondary | ICD-10-CM | POA: Insufficient documentation

## 2021-11-19 NOTE — Progress Notes (Signed)
[name], [name] ([id number]) Visit Report for 11/19/2021 HPI Details Patient Name: Date of Service: GO [name] NA TO [name] 11/19/2021 8:30 A M Medical Record Number: [id number] Patient Account Number: [id number] Date of Birth/Sex: Treating RN: 1946-06-15 (76 y.o. [name] Primary Care Provider: Jamesetta [name] Other Clinician: Referring Provider: Treating Provider/Extender: [name] in Treatment: 5 History of Present Illness HPI Description: ADMISSION 10/13/2021 This is a 76 year old man who is actually from [place] speaking both [place] and [place]. We interviewed him through a [place] interpreter. That seemed to work quite well. The patient has had significant disease this year including diffuse large B-cell lymphoma stage III-IV. He is finished chemotherapy as directed by Dr. Lorenso Courier of oncology. Apparently he has had a good response. During the last visit in follow-up with Dr. Lorenso Courier on 09/23/2021 it was noted that he had bilateral lower extremity ulcers felt to be secondary to edema and the use of steroids as part of his chemotherapy. He is no longer on prednisone. He also has painful areas in the right inguinal area which apparently started as a large blister about 4 to 6 weeks ago and is contracted to 4 small areas according predominantly to the interpreter who seems to have been to most of his appointments. As noted he was in hospital with sepsis in July and had concerns for endocarditis treated with 6 weeks of antibiotics via PICC line. As noted he finished his [facility] E[facility] earlier this month. Past medical history includes diffuse large B-cell lymphoma is noted, atrial fibrillation on Eliquis and he is also on Plavix, lower extremity edema, coronary artery disease, type 2 diabetes. As noted possible endocarditis, sleep apnea and chronic A. fib on amiodarone His ABIs in our clinic were actually quite good at 1.07 and 1.04 10/20/2021; patient  today arrives with a new wound on the left anterior lower leg. The culture I did of the posterior area last week showed methicillin sensitive staph aureus. He states that the area in his right inguinal area is no longer is painful but he has a new open area in the pannus fold above this. I found myself wondering today whether this is all staff aureus related rather than anything more exotic related to his lymphoma or chemotherapy which ended about 3 weeks ago 12/14; the patient only has a left posterior leg and the right anterior lower leg wound from last week. The culture I did last week showed the same as has is a. He has been on cephalexin since 12 6 and everything looks a lot better. In fact the unusual areas in the right groin area have healed. I suspect all of this was methicillin sensitive staph aureus and I went over this with him today 12/22; the patient has 2 open wounds 1 on the right anterior and the one on the left posterior. Both of these had eschar which I removed. These are not closed. His edema control is good but he does not have compression stockings 12/29; he did not allow wrap yet last week. He comes in today with markedly increased edema bilaterally. Several open wounds anteriorly on the right and medially in the upper calf a small area on the left medial calf. We are definitely going to need compression stockings and we talked about this with him today through his [place] 11/19/2021; patient comes in today he does not have stockings. We have him in a Coflex 2. Edema control is not that good. In spite of  this he has wounds x2 on the right that are just about healed and the area small area on the posterior left calf Through the translator I find myself arguing about the merits of stockings today. I have tried to convey to him that without compression stockings he is likely to have recurrent wounds. He does not seem particularly enamored with the idea of stockings but  neither did his translator today. I was not really sure how much we conveyed. We tried to get him his measurements and a phone number for elastic therapy last week but this did not seem to materialize Electronic Signature(s) Signed: 11/19/2021 4:38:12 PM By: Linton Ham MD Entered By: Linton Ham on 11/19/2021 09:39:55 -------------------------------------------------------------------------------- Physical Exam Details Patient Name: Date of Service: [name], A NA TO Stanley S. 11/19/2021 8:30 A M Medical Record Number: [id number] Patient Account Number: [id number] Date of Birth/Sex: Treating RN: 1946/01/07 (76 y.o. [name] Primary Care Provider: Jamesetta [name] Other Clinician: Referring Provider: Treating Provider/Extender: [name] in Treatment: 5 Constitutional Patient is hypertensive.. Pulse regular and within target range for patient.Marland Kitchen Respirations regular, non-labored and within target range.. Temperature is normal and within the target range for the patient.Marland Kitchen Appears in no distress. Cardiovascular Edema present in both extremities.. Notes Wound exam; superficial wounds on the right anterior mid tibia x2 are just about epithelialized. The area on his right medial upper leg is closed. He has a small area posteriorly. He has 2-3+ pitting edema 2-3+ pitting. He does not have an arterial issue. I saw no evidence of infection around these wounds which at 1 point were probably small staph abscesses Electronic Signature(s) Signed: 11/19/2021 4:38:12 PM By: Linton Ham MD Entered By: Linton Ham on 11/19/2021 09:41:20 -------------------------------------------------------------------------------- Physician Orders Details Patient Name: Date of Service: [name], A NA TO Stanley S. 11/19/2021 8:30 A M Medical Record Number: [id number] Patient Account Number: [id number] Date of Birth/Sex: Treating RN: July 11, 1946 (76 y.o. [name] Primary Care Provider: Jamesetta [name] Other Clinician: Referring Provider: Treating Provider/Extender: [name] in Treatment: 5 Verbal / Phone Orders: No Diagnosis Coding ICD-10 Coding Code Description [phone] Non-pressure chronic ulcer of other part of left lower leg with other specified severity L97.818 Non-pressure chronic ulcer of other part of right lower leg with other specified severity I87.313 Chronic venous hypertension (idiopathic) with ulcer of bilateral lower extremity C83.30 Diffuse large B-cell lymphoma, unspecified site Follow-up Appointments ppointment in 1 week. - Dr. Dellia Nims Return A ***Call the company, provide the leg measurements, the company will provide you with the size of compression stockings, then will ship to you once you pay over the phone. ***Bring in compression stockings next week to wound care appointment if wounds are heal staff will show how to apply stockings. Bathing/ Shower/ Hygiene May shower with protection but do not get wound dressing(s) wet. - Ok to use Market researcher, can purchase at [facility], [facility], or [employer] Edema Control - Lymphedema / SCD / Other Elevate legs to the level of the heart or above for 30 minutes daily and/or when sitting, a frequency of: - throughout the day Avoid standing for long periods of time. Exercise regularly Other Edema Control Orders/Instructions: - bring in stockings next week. Wound Treatment Wound #2 - Lower Leg Wound Laterality: Left, Posterior Cleanser: Soap and Water 1 x Per Week Discharge Instructions: May shower and wash wound with dial antibacterial soap and water prior to dressing change. Peri-Wound Care: Sween Lotion (Moisturizing lotion) 1  x Per Week Discharge Instructions: Apply moisturizing lotion as directed Prim Dressing: KerraCel Ag Gelling Fiber Dressing, 2x2 in (silver alginate) 1 x Per Week ary Discharge Instructions: Apply silver alginate to  wound bed as instructed Secondary Dressing: ABD Pad, 8x10 1 x Per Week Discharge Instructions: Apply over primary dressing as directed. Compression Wrap: FourPress (4 layer compression wrap) 1 x Per Week Discharge Instructions: Apply four layer compression first layer of unna boot to upper portion of lower leg to secure wrap in place. Wound #6 - Lower Leg Wound Laterality: Right, Anterior, Distal Cleanser: Soap and Water 1 x Per Week Discharge Instructions: May shower and wash wound with dial antibacterial soap and water prior to dressing change. Peri-Wound Care: Sween Lotion (Moisturizing lotion) 1 x Per Week Discharge Instructions: Apply moisturizing lotion as directed Prim Dressing: KerraCel Ag Gelling Fiber Dressing, 2x2 in (silver alginate) 1 x Per Week ary Discharge Instructions: Apply silver alginate to wound bed as instructed Secondary Dressing: ABD Pad, 8x10 1 x Per Week Discharge Instructions: Apply over primary dressing as directed. Compression Wrap: FourPress (4 layer compression wrap) 1 x Per Week Discharge Instructions: Apply four layer compression first layer of unna boot to upper portion of lower leg to secure wrap in place. Electronic Signature(s) Signed: 11/19/2021 4:38:12 PM By: Linton Ham MD Signed: 11/19/2021 5:33:22 PM By: Deon Pilling RN, BSN Entered By: Deon Pilling on 11/19/2021 09:40:51 -------------------------------------------------------------------------------- Problem List Details Patient Name: Date of Service: GO Bari Edward, A NA TO Stanley S. 11/19/2021 8:30 A M Medical Record Number: [id number] Patient Account Number: [id number] Date of Birth/Sex: Treating RN: 30-Mar-1946 (76 y.o. [name] Primary Care Provider: Jamesetta [name] Other Clinician: Referring Provider: Treating Provider/Extender: [name] in Treatment: 5 Active Problems ICD-10 Encounter Code Description Active Date MDM Diagnosis L97.828  Non-pressure chronic ulcer of other part of left lower leg with other specified 10/13/2021 No Yes severity L97.818 Non-pressure chronic ulcer of other part of right lower leg with other specified 10/13/2021 No Yes severity I87.313 Chronic venous hypertension (idiopathic) with ulcer of bilateral lower extremity 10/13/2021 No Yes C83.30 Diffuse large B-cell lymphoma, unspecified site 10/13/2021 No Yes Inactive Problems ICD-10 Code Description Active Date Inactive Date B95.61 Methicillin susceptible Staphylococcus aureus infection as the cause of diseases 10/20/2021 10/20/2021 classified elsewhere Resolved Problems ICD-10 Code Description Active Date Resolved Date S31.103D Unspecified open wound of abdominal wall, right lower quadrant without penetration 10/13/2021 10/13/2021 into peritoneal cavity, subsequent encounter Electronic Signature(s) Signed: 11/19/2021 4:38:12 PM By: Linton Ham MD Entered By: Linton Ham on 11/19/2021 09:38:07 -------------------------------------------------------------------------------- Progress Note Details Patient Name: Date of Service: [name], A NA TO Tonye Royalty S. 11/19/2021 8:30 A M Medical Record Number: [id number] Patient Account Number: [id number] Date of Birth/Sex: Treating RN: [birth year] (76 y.o. [name] Primary Care Provider: Jamesetta [name] Other Clinician: Referring Provider: Treating Provider/Extender: [name] in Treatment: 5 Subjective History of Present Illness (HPI) ADMISSION 10/13/2021 This is a 76 year old man who is actually from [place] speaking both [place] and [place]. We interviewed him through a [place] interpreter. That seemed to work quite well. The patient has had significant disease this year including diffuse large B-cell lymphoma stage III-IV. He is finished chemotherapy as directed by Dr. Lorenso Courier of oncology. Apparently he has had a good response. During the last visit in  follow-up with Dr. Lorenso Courier on 09/23/2021 it was noted that he had bilateral lower extremity ulcers felt to be secondary to edema and the use of steroids as  part of his chemotherapy. He is no longer on prednisone. He also has painful areas in the right inguinal area which apparently started as a large blister about 4 to 6 weeks ago and is contracted to 4 small areas according predominantly to the interpreter who seems to have been to most of his appointments. As noted he was in hospital with sepsis in July and had concerns for endocarditis treated with 6 weeks of antibiotics via PICC line. As noted he finished his [facility] E[facility] earlier this month. Past medical history includes diffuse large B-cell lymphoma is noted, atrial fibrillation on Eliquis and he is also on Plavix, lower extremity edema, coronary artery disease, type 2 diabetes. As noted possible endocarditis, sleep apnea and chronic A. fib on amiodarone His ABIs in our clinic were actually quite good at 1.07 and 1.04 10/20/2021; patient today arrives with a new wound on the left anterior lower leg. The culture I did of the posterior area last week showed methicillin sensitive staph aureus. He states that the area in his right inguinal area is no longer is painful but he has a new open area in the pannus fold above this. I found myself wondering today whether this is all staff aureus related rather than anything more exotic related to his lymphoma or chemotherapy which ended about 3 weeks ago 12/14; the patient only has a left posterior leg and the right anterior lower leg wound from last week. The culture I did last week showed the same as has is a. He has been on cephalexin since 12 6 and everything looks a lot better. In fact the unusual areas in the right groin area have healed. I suspect all of this was methicillin sensitive staph aureus and I went over this with him today 12/22; the patient has 2 open wounds 1 on the right anterior and the one on  the left posterior. Both of these had eschar which I removed. These are not closed. His edema control is good but he does not have compression stockings 12/29; he did not allow wrap yet last week. He comes in today with markedly increased edema bilaterally. Several open wounds anteriorly on the right and medially in the upper calf a small area on the left medial calf. We are definitely going to need compression stockings and we talked about this with him today through his [place] 11/19/2021; patient comes in today he does not have stockings. We have him in a Coflex 2. Edema control is not that good. In spite of this he has wounds x2 on the right that are just about healed and the area small area on the posterior left calf Through the translator I find myself arguing about the merits of stockings today. I have tried to convey to him that without compression stockings he is likely to have recurrent wounds. He does not seem particularly enamored with the idea of stockings but neither did his translator today. I was not really sure how much we conveyed. We tried to get him his measurements and a phone number for elastic therapy last week but this did not seem to materialize Objective Constitutional Patient is hypertensive.. Pulse regular and within target range for patient.Marland Kitchen Respirations regular, non-labored and within target range.. Temperature is normal and within the target range for the patient.Marland Kitchen Appears in no distress. Vitals Time Taken: 8:45 AM, Temperature: 98 F, Pulse: 74 bpm, Respiratory Rate: 20 breaths/min, Blood Pressure: 154/80 mmHg. Cardiovascular Edema present in both extremities.. General Notes: Wound  exam; superficial wounds on the right anterior mid tibia x2 are just about epithelialized. The area on his right medial upper leg is closed. He has a small area posteriorly. He has 2-3+ pitting edema 2-3+ pitting. He does not have an arterial issue. I saw no evidence of  infection around these wounds which at 1 point were probably small staph abscesses Integumentary (Hair, Skin) Wound #1 status is Healed - Epithelialized. Original cause of wound was Gradually Appeared. The date acquired was: 07/16/2021. The wound has been in treatment 5 weeks. The wound is located on the Right,Anterior Lower Leg. The wound measures 0cm length x 0cm width x 0cm depth; 0cm^2 area and 0cm^3 volume. There is Fat Layer (Subcutaneous Tissue) exposed. There is no tunneling or undermining noted. There is a none present amount of drainage noted. The wound margin is flat and intact. There is no granulation within the wound bed. There is no necrotic tissue within the wound bed. Wound #2 status is Open. Original cause of wound was Gradually Appeared. The date acquired was: 09/28/2021. The wound has been in treatment 5 weeks. The wound is located on the Left,Posterior Lower Leg. The wound measures 0.2cm length x 0.2cm width x 0.1cm depth; 0.031cm^2 area and 0.003cm^3 volume. There is Fat Layer (Subcutaneous Tissue) exposed. There is no tunneling or undermining noted. There is a small amount of serosanguineous drainage noted. The wound margin is flat and intact. There is large (67-100%) red granulation within the wound bed. There is no necrotic tissue within the wound bed. Wound #6 status is Open. Original cause of wound was Blister. The date acquired was: 10/11/2021. The wound has been in treatment 1 weeks. The wound is located on the Right,Distal,Anterior Lower Leg. The wound measures 0.1cm length x 0.1cm width x 0.1cm depth; 0.008cm^2 area and 0.001cm^3 volume. There is Fat Layer (Subcutaneous Tissue) exposed. There is no tunneling or undermining noted. There is a small amount of serosanguineous drainage noted. The wound margin is distinct with the outline attached to the wound base. There is large (67-100%) pink, pale granulation within the wound bed. There is no necrotic tissue within the wound  bed. 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 There was a Four Layer Compression Therapy Procedure by Deon Pilling, RN. Post procedure Diagnosis Wound #2: Same as Pre-Procedure Wound #6 Pre-procedure diagnosis of Wound #6 is a Venous Leg Ulcer located on the Right,Distal,Anterior Lower Leg . There was a Four Layer Compression Therapy Procedure by Deon Pilling, RN. Post procedure Diagnosis Wound #6: Same as Pre-Procedure Plan Follow-up Appointments: Return Appointment in 1 week. - Dr. Dellia Nims ***Call the company, provide the leg measurements, the company will provide you with the size of compression stockings, then will ship to you once you pay over the phone. ***Bring in compression stockings next week to wound care appointment if wounds are heal staff will show how to apply stockings. Bathing/ Shower/ Hygiene: May shower with protection but do not get wound dressing(s) wet. - Ok to use [employer], can purchase at [facility], [facility], or [employer] Edema Control - Lymphedema / SCD / Other: Elevate legs to the level of the  heart or above for 30 minutes daily and/or when sitting, a frequency of: - throughout the day Avoid standing for long periods of time. Exercise regularly Other Edema Control Orders/Instructions: - bring in stockings next week. WOUND #2: - Lower Leg Wound Laterality: Left, Posterior Cleanser: Soap and Water 1 x Per Week/ Discharge Instructions: May shower and wash wound with dial antibacterial soap and water prior to dressing change. Peri-Wound Care: Sween  Lotion (Moisturizing lotion) 1 x Per Week/ Discharge Instructions: Apply moisturizing lotion as directed Prim Dressing: KerraCel Ag Gelling Fiber Dressing, 2x2 in (silver alginate) 1 x Per Week/ ary Discharge Instructions: Apply silver alginate to wound bed as instructed Secondary Dressing: ABD Pad, 8x10 1 x Per Week/ Discharge Instructions: Apply over primary dressing as directed. Com pression Wrap: FourPress (4 layer compression wrap) 1 x Per Week/ Discharge Instructions: Apply four layer compression first layer of unna boot to upper portion of lower leg to secure wrap in place. WOUND #6: - Lower Leg Wound Laterality: Right, Anterior, Distal Cleanser: Soap and Water 1 x Per Week/ Discharge Instructions: May shower and wash wound with dial antibacterial soap and water prior to dressing change. Peri-Wound Care: Sween Lotion (Moisturizing lotion) 1 x Per Week/ Discharge Instructions: Apply moisturizing lotion as directed Prim Dressing: KerraCel Ag Gelling Fiber Dressing, 2x2 in (silver alginate) 1 x Per Week/ ary Discharge Instructions: Apply silver alginate to wound bed as instructed Secondary Dressing: ABD Pad, 8x10 1 x Per Week/ Discharge Instructions: Apply over primary dressing as directed. Com pression Wrap: FourPress (4 layer compression wrap) 1 x Per Week/ Discharge Instructions: Apply four layer compression first layer of unna boot to upper portion of lower leg to secure wrap in place. 1. Still silver alginate/ABDs and I have increased him to 4-layer compression bilaterally. 2. All his wounds should be healed by next week 3. The real issue is whether we are going to be able to graduate him into stockings. I have tried to convey that unless he does he is likely to be back urine reasonably short order 4. He is going to need compression. When he did not show up after we did not wrap him he developed probably chronic venous insufficiency wounds. 5. He had staph abscesses cellulitis when  he first came in this has not been a problem recently Electronic Signature(s) Signed: 11/19/2021 4:38:12 PM By: Linton Ham MD Entered By: Linton Ham on 11/19/2021 09:43:57 -------------------------------------------------------------------------------- SuperBill Details Patient Name: Date of Service: [name], A NA TO [name] 11/19/2021 Medical Record Number: [id number] Patient Account Number: [id number] Date of Birth/Sex: Treating RN: 09-11-1946 (76 y.o. [name] Primary Care Provider: Jamesetta [name] Other Clinician: Referring Provider: Treating Provider/Extender: [name] in Treatment: 5 Diagnosis Coding ICD-10 Codes Code Description [id number] Non-pressure chronic ulcer of other part of left lower leg with other specified severity L97.818 Non-pressure chronic ulcer of other part of right lower leg with other specified severity I87.313 Chronic venous hypertension (idiopathic) with ulcer of bilateral lower extremity C83.30 Diffuse large B-cell lymphoma, unspecified site Facility Procedures CPT4: Code [id number] Description: 81 BILATERAL: Application of multi-layer venous compression system; leg (below knee), including ankle and t. Modifier: Quantity: 1 Physician Procedures : CPT4 Code Description Modifier [id number] - WC PHYS LEVEL 4 - EST PT ICD-10 Diagnosis Description L97.818 Non-pressure chronic ulcer of other part of right lower leg with other specified severity L97.828 Non-pressure chronic ulcer of other part of  left lower leg with other specified severity  I87.313 Chronic venous hypertension (idiopathic) with ulcer of bilateral lower extremity Quantity: 1 Electronic Signature(s) Signed: 11/19/2021 4:38:12 PM By: Linton Ham MD Signed: 11/19/2021 5:33:22 PM By: Deon Pilling RN, BSN Entered By: Deon Pilling on 11/19/2021 09:45:26

## 2021-11-19 NOTE — Progress Notes (Addendum)
[name], [name] ([id number]) Visit Report for 11/19/2021 Arrival Information Details Patient Name: Date of Service: [name] NA TO [name] 11/19/2021 8:30 [name] M Medical Record [name]: [id number] Patient Account [name]: [id number] Date of Birth/Sex: Treating RN: July 20, 1946 (76 y.o. [name] Primary Care Ivori Storr: [name] Other Clinician: Referring Rami Budhu: Treating Maggi Hershkowitz/Extender: Edgardo Roys in Treatment: 5 Visit Information History Since Last Visit Added or deleted any medications: No Patient Arrived: [name] Any new allergies or adverse reactions: No Arrival Time: 08:51 Had [name] fall or experienced change in No Accompanied By: interpreter activities of daily living that may affect Transfer Assistance: None risk of falls: Patient Identification Verified: Yes Signs or symptoms of abuse/neglect since last visito No Secondary Verification Process Completed: Yes Hospitalized since last visit: No Patient Requires Transmission-Based Precautions: No Implantable device outside of the clinic excluding No Patient Has Alerts: Yes cellular tissue based products placed in the center Patient Alerts: Patient on Blood Thinner since last visit: Has Dressing in Place as Prescribed: Yes Has Compression in Place as Prescribed: Yes Pain Present Now: No Electronic Signature(s) Signed: 11/19/2021 5:33:22 PM By: Deon Pilling RN, BSN Entered By: Deon Pilling on 11/19/2021 08:51:54 -------------------------------------------------------------------------------- Compression Therapy Details Patient Name: Date of Service: [name], [name] NA TO Stanley Royalty S. 11/19/2021 8:30 [name] M Medical Record [name]: [id number] Patient Account [name]: [id number] Date of Birth/Sex: Treating RN: 04-13-1946 (76 y.o. [name] Primary Care Kayliana Codd: [name] Other Clinician: Referring Mazal Ebey: Treating Raychelle Hudman/Extender: Edgardo Roys in  Treatment: 5 Compression Therapy Performed for Wound Assessment: Wound #2 Left,Posterior Lower Leg Performed By: Clinician Deon Pilling, RN Compression Type: Four Layer Post Procedure Diagnosis Same as Pre-procedure Electronic Signature(s) Signed: 11/19/2021 5:33:22 PM By: Deon Pilling RN, BSN Entered By: Deon Pilling on 11/19/2021 09:18:10 -------------------------------------------------------------------------------- Compression Therapy Details Patient Name: Date of Service: [name], [name] NA TO Stanley S. 11/19/2021 8:30 [name] M Medical Record [name]: [id number] Patient Account [name]: [id number] Date of Birth/Sex: Treating RN: [birth year] (76 y.o. [name] Primary Care Kaedyn Polivka: [name] Other Clinician: Referring Donnica Jarnagin: Treating Adelae Yodice/Extender: Edgardo Roys in Treatment: 5 Compression Therapy Performed for Wound Assessment: Wound #6 Right,Distal,Anterior Lower Leg Performed By: Clinician Deon Pilling, RN Compression Type: Four Layer Post Procedure Diagnosis Same as Pre-procedure Electronic Signature(s) Signed: 11/19/2021 5:33:22 PM By: Deon Pilling RN, BSN Entered By: Deon Pilling on 11/19/2021 09:18:10 -------------------------------------------------------------------------------- Encounter Discharge Information Details Patient Name: Date of Service: [name], [name] NA TO Stanley S. 11/19/2021 8:30 [name] M Medical Record [name]: [id number] Patient Account [name]: [id number] Date of Birth/Sex: Treating RN: Oct 13, 1946 (76 y.o. [name] Primary Care Doneshia Hill: [name] Other Clinician: Referring Timya Trimmer: Treating Katlynn Naser/Extender: Edgardo Roys in Treatment: 5 Encounter Discharge Information Items Discharge Condition: Stable Ambulatory Status: Cane Discharge Destination: Home Transportation: Private Auto Accompanied By: interpreter Schedule Follow-up Appointment: Yes Clinical Summary  of Care: Electronic Signature(s) Signed: 11/19/2021 5:33:22 PM By: Deon Pilling RN, BSN Entered By: Deon Pilling on 11/19/2021 09:48:14 -------------------------------------------------------------------------------- Lower Extremity Assessment Details Patient Name: Date of Service: [name], [name] NA TO Stanley S. 11/19/2021 8:30 [name] M Medical Record [name]: [id number] Patient Account [name]: [id number] Date of Birth/Sex: Treating RN: 1946-02-12 (76 y.o. [name] Primary Care Jamar Casagrande: [name] Other Clinician: Referring Esvin Hnat: Treating Jeryn Bertoni/Extender: Edgardo Roys in Treatment: 5 Edema Assessment Assessed: [name]: Yes] [Right: Yes] Edema: [Left: Yes] [Right: Yes] Calf Left: Right: Point of Measurement: 30 cm From Medial  Instep 38 cm 37 cm Ankle Left: Right: Point of Measurement: 11 cm From Medial Instep 28 cm 28 cm Knee To Floor Left: Right: From Medial Instep 50 cm 50 cm Vascular Assessment Pulses: Dorsalis Pedis Palpable: [Left:Yes] [Right:Yes] Electronic Signature(s) Signed: 11/19/2021 5:33:22 PM By: Deon Pilling RN, BSN Entered By: Deon Pilling on 11/19/2021 09:06:48 -------------------------------------------------------------------------------- Multi Wound Chart Details Patient Name: Date of Service: [name], [name] NA TO Stanley Royalty S. 11/19/2021 8:30 [name] M Medical Record [name]: [id number] Patient Account [name]: [id number] Date of Birth/Sex: Treating RN: [birth year] (76 y.o. Lorette Ang, Meta.Reding Primary Care Anjel Perfetti: [name] Other Clinician: Referring Tanishi Nault: Treating Rebecka Oelkers/Extender: Edgardo Roys in Treatment: 5 Vital Signs Height(in): Pulse(bpm): 63 Weight(lbs): Blood Pressure(mmHg): 154/80 Body Mass Index(BMI): Temperature(F): 98 Respiratory Rate(breaths/min): 20 Photos: [1:No Photos] Right, Anterior Lower Leg Left, Posterior Lower Leg Right, Distal, Anterior Lower Leg Wound  Location: Gradually Appeared Gradually Appeared Blister Wounding Event: Venous Leg Ulcer Venous Leg Ulcer Venous Leg Ulcer Primary Etiology: N/[name] N/[name] Lymphedema Secondary Etiology: Sleep Apnea, Arrhythmia, Coronary Sleep Apnea, Arrhythmia, Coronary Sleep Apnea, Arrhythmia, Coronary Comorbid History: Artery Disease, Hypertension, Type II Artery Disease, Hypertension, Type II Artery Disease, Hypertension, Type II Diabetes, Received Chemotherapy Diabetes, Received Chemotherapy Diabetes, Received Chemotherapy 07/16/2021 09/28/2021 10/11/2021 Date Acquired: 5 5 1  Weeks of Treatment: Healed - Epithelialized Open Open Wound Status: Yes Yes No Clustered Wound: 2 1 N/[name] Clustered Quantity: 0x0x0 0.2x0.2x0.1 0.1x0.1x0.1 Measurements L x W x D (cm) 0 0.031 0.008 [name] (cm) : rea 0 0.003 0.001 Volume (cm) : 100.00% 98.70% 99.40% % Reduction in Area: 100.00% 98.80% 99.30% % Reduction in Volume: Full Thickness Without Exposed Full Thickness Without Exposed Full Thickness Without Exposed Classification: Support Structures Support Structures Support Structures None Present Small Small Exudate Amount: N/[name] Serosanguineous Serosanguineous Exudate Type: N/[name] red, brown red, brown Exudate Color: Flat and Intact Flat and Intact Distinct, outline attached Wound Margin: None Present (0%) Large (67-100%) Large (67-100%) Granulation Amount: N/[name] Red Pink, Pale Granulation Quality: None Present (0%) None Present (0%) None Present (0%) Necrotic Amount: Fat Layer (Subcutaneous Tissue): Yes Fat Layer (Subcutaneous Tissue): Yes Fat Layer (Subcutaneous Tissue): Yes Exposed Structures: Fascia: No Fascia: No Fascia: No Tendon: No Tendon: No Tendon: No Muscle: No Muscle: No Muscle: No Joint: No Joint: No Joint: No Bone: No Bone: No Bone: No Large (67-100%) Large (67-100%) Large (67-100%) Epithelialization: N/[name] Compression Therapy Compression Therapy Procedures Performed: Treatment  Notes Electronic Signature(s) Signed: 11/19/2021 4:38:12 PM By: Linton Ham MD Signed: 11/19/2021 5:33:22 PM By: Deon Pilling RN, BSN Entered By: Linton Ham on 11/19/2021 09:38:18 -------------------------------------------------------------------------------- Multi-Disciplinary Care Plan Details Patient Name: Date of Service: [name], [name] NA TO Stanley S. 11/19/2021 8:30 [name] M Medical Record [name]: [id number] Patient Account [name]: [id number] Date of Birth/Sex: Treating RN: December 05, 1945 (76 y.o. [name] Primary Care Tyia Binford: [name] Other Clinician: Referring Kadian Barcellos: Treating Takiyah Bohnsack/Extender: Edgardo Roys in Treatment: 5 Multidisciplinary Care Plan reviewed with physician Active Inactive Electronic Signature(s) Signed: 12/30/2021 1:00:59 PM By: Deon Pilling RN, BSN Previous Signature: 11/19/2021 5:33:22 PM Version By: Deon Pilling RN, BSN Entered By: Deon Pilling on 12/30/2021 13:00:58 -------------------------------------------------------------------------------- Pain Assessment Details Patient Name: Date of Service: [name], [name] NA TO Stanley S. 11/19/2021 8:30 [name] M Medical Record [name]: [id number] Patient Account [name]: [id number] Date of Birth/Sex: Treating RN: 06/11/1946 (76 y.o. [name] Primary Care Lynnell Fiumara: [name] Other Clinician: Referring Angeleena Dueitt: Treating Debbe Crumble/Extender: Edgardo Roys in Treatment: 5 Active Problems Location  of Pain Severity and Description of Pain Patient Has Paino No Site Locations Rate the pain. Current Pain Level: 0 Pain Management and Medication Current Pain Management: Medication: No Cold Application: No Rest: No Massage: No Activity: No T.E.N.S.: No Heat Application: No Leg drop or elevation: No Is the Current Pain Management Adequate: Adequate How does your wound impact your activities of daily livingo Sleep: No Bathing:  No Appetite: No Relationship With Others: No Bladder Continence: No Emotions: No Bowel Continence: No Work: No Toileting: No Drive: No Dressing: No Hobbies: No Engineer, maintenance) Signed: 11/19/2021 5:33:22 PM By: Deon Pilling RN, BSN Entered By: Deon Pilling on 11/19/2021 08:54:45 -------------------------------------------------------------------------------- Patient/Caregiver Education Details Patient Name: Date of Service: [name], [name] NA TO [name] 1/5/2023andnbsp8:30 [name]: [id number] Patient Account [name]: [id number] Date of Birth/Gender: Treating RN: 05/02/1946 (76 y.o. [name] Primary Care Physician: [name] Other Clinician: Referring Physician: Treating Physician/Extender: Edgardo Roys in Treatment: 5 Education Assessment Education Provided To: Patient Education Topics Provided Venous: Handouts: Controlling Swelling with Compression Stockings Methods: Explain/Verbal, Printed Responses: Reinforcements needed Electronic Signature(s) Signed: 11/19/2021 5:33:22 PM By: Deon Pilling RN, BSN Entered By: Deon Pilling on 11/19/2021 09:07:46 -------------------------------------------------------------------------------- Wound Assessment Details Patient Name: Date of Service: [name], [name] NA TO Stanley Royalty S. 11/19/2021 8:30 [name] M Medical Record [name]: [id number] Patient Account [name]: [id number] Date of Birth/Sex: Treating RN: 08-Sep-1946 (76 y.o. [name] Primary Care Beryl Hornberger: [name] Other Clinician: Referring Dineen Conradt: Treating Sani Madariaga/Extender: Edgardo Roys in Treatment: 5 Wound Status Wound [name]: 1 Primary Venous Leg Ulcer Etiology: Wound Location: Right, Anterior Lower Leg Wound Healed - Epithelialized Wounding Event: Gradually Appeared Status: Date Acquired: 07/16/2021 Comorbid Sleep Apnea, Arrhythmia, Coronary Artery Disease,  Hypertension, Weeks Of Treatment: 5 History: Type II Diabetes, Received Chemotherapy Clustered Wound: Yes Wound Measurements Length: (cm) Width: (cm) Depth: (cm) Clustered Quantity: Area: (cm) Volume: (cm) 0 % Reduction in Area: 100% 0 % Reduction in Volume: 100% 0 Epithelialization: Large (67-100%) 2 Tunneling: No 0 Undermining: No 0 Wound Description Classification: Full Thickness Without Exposed Support Structures Wound Margin: Flat and Intact Exudate Amount: None Present Foul Odor After Cleansing: No Slough/Fibrino No Wound Bed Granulation Amount: None Present (0%) Exposed Structure Necrotic Amount: None Present (0%) Fascia Exposed: No Fat Layer (Subcutaneous Tissue) Exposed: Yes Tendon Exposed: No Muscle Exposed: No Joint Exposed: No Bone Exposed: No Electronic Signature(s) Signed: 11/19/2021 5:33:22 PM By: Deon Pilling RN, BSN Entered By: Deon Pilling on 11/19/2021 09:09:24 -------------------------------------------------------------------------------- Wound Assessment Details Patient Name: Date of Service: [name], [name] NA TO Stanley Royalty S. 11/19/2021 8:30 [name] M Medical Record [name]: [id number] Patient Account [name]: [id number] Date of Birth/Sex: Treating RN: April 18, 1946 (76 y.o. Lorette Ang, Meta.Reding Primary Care Fredi Geiler: [name] Other Clinician: Referring Korban Shearer: Treating Soniyah Mcglory/Extender: Edgardo Roys in Treatment: 5 Wound Status Wound [name]: 2 Primary Venous Leg Ulcer Etiology: Wound Location: Left, Posterior Lower Leg Wound Open Wounding Event: Gradually Appeared Status: Status: Date Acquired: 09/28/2021 Comorbid Sleep Apnea, Arrhythmia, Coronary Artery Disease, Hypertension, Weeks Of Treatment: 5 History: Type II Diabetes, Received Chemotherapy Clustered Wound: Yes Photos Wound Measurements Length: (cm) 0 Width: (cm) 0 Depth: (cm) 0 Clustered Quantity: 1 Area: (cm) Volume: (cm) .2 % Reduction in Area:  98.7% .2 % Reduction in Volume: 98.8% .1 Epithelialization: Large (67-100%) Tunneling: No 0.031 Undermining: No 0.003 Wound Description Classification: Full Thickness Without Exposed Support Structures Wound Margin: Flat and Intact Exudate Amount: Small Exudate Type: Serosanguineous  Exudate Color: red, brown Foul Odor After Cleansing: No Slough/Fibrino No Wound Bed Granulation Amount: Large (67-100%) Exposed Structure Granulation Quality: Red Fascia Exposed: No Necrotic Amount: None Present (0%) Fat Layer (Subcutaneous Tissue) Exposed: Yes Tendon Exposed: No Muscle Exposed: No Joint Exposed: No Bone Exposed: No Electronic Signature(s) Signed: 11/19/2021 5:33:22 PM By: Deon Pilling RN, BSN Entered By: Deon Pilling on 11/19/2021 09:10:11 -------------------------------------------------------------------------------- Wound Assessment Details Patient Name: Date of Service: [name], [name] NA TO Stanley Royalty S. 11/19/2021 8:30 [name] M Medical Record [name]: [id number] Patient Account [name]: [id number] Date of Birth/Sex: Treating RN: 1945/11/25 (76 y.o. [name] Primary Care Amber Williard: [name] Other Clinician: Referring Karee Christopherson: Treating Macarius Ruark/Extender: Edgardo Roys in Treatment: 5 Wound Status Wound [name]: 6 Primary Venous Leg Ulcer Etiology: Wound Location: Right, Distal, Anterior Lower Leg Secondary Lymphedema Wounding Event: Blister Etiology: Date Acquired: 10/11/2021 Wound Open Weeks Of Treatment: 1 Status: Clustered Wound: No Comorbid Sleep Apnea, Arrhythmia, Coronary Artery Disease, History: Hypertension, Type II Diabetes, Received Chemotherapy Photos Wound Measurements Length: (cm) 0.1 Width: (cm) 0.1 Depth: (cm) 0.1 Area: (cm) 0.008 Volume: (cm) 0.001 % Reduction in Area: 99.4% % Reduction in Volume: 99.3% Epithelialization: Large (67-100%) Tunneling: No Undermining: No Wound Description Classification: Full  Thickness Without Exposed Support Structures Wound Margin: Distinct, outline attached Exudate Amount: Small Exudate Type: Serosanguineous Exudate Color: red, brown Foul Odor After Cleansing: No Slough/Fibrino No Wound Bed Granulation Amount: Large (67-100%) Exposed Structure Granulation Quality: Pink, Pale Fascia Exposed: No Necrotic Amount: None Present (0%) Fat Layer (Subcutaneous Tissue) Exposed: Yes Tendon Exposed: No Muscle Exposed: No Joint Exposed: No Bone Exposed: No Electronic Signature(s) Signed: 11/19/2021 5:33:22 PM By: Deon Pilling RN, BSN Entered By: Deon Pilling on 11/19/2021 09:10:47 -------------------------------------------------------------------------------- Vitals Details Patient Name: Date of Service: [name], [name] NA TO Stanley Royalty S. 11/19/2021 8:30 [name] M Medical Record [name]: [id number] Patient Account [name]: [id number] Date of Birth/Sex: Treating RN: June 02, 1946 (76 y.o. [name] Primary Care Kellsey Sansone: [name] Other Clinician: Referring Amalie Koran: Treating Daeveon Zweber/Extender: Edgardo Roys in Treatment: 5 Vital Signs Time Taken: 08:45 Temperature (F): 98 Pulse (bpm): 74 Respiratory Rate (breaths/min): 20 Blood Pressure (mmHg): 154/80 Reference Range: 80 - 120 mg / dl Electronic Signature(s) Signed: 11/19/2021 5:33:22 PM By: Deon Pilling RN, BSN Entered By: Deon Pilling on 11/19/2021 08:54:34

## 2021-11-26 ENCOUNTER — Encounter (HOSPITAL_BASED_OUTPATIENT_CLINIC_OR_DEPARTMENT_OTHER): Payer: Medicare HMO | Admitting: Internal Medicine

## 2021-11-27 ENCOUNTER — Other Ambulatory Visit: Payer: Self-pay | Admitting: Hematology and Oncology

## 2021-12-11 ENCOUNTER — Other Ambulatory Visit: Payer: Self-pay | Admitting: Hematology and Oncology

## 2021-12-18 ENCOUNTER — Other Ambulatory Visit: Payer: Self-pay

## 2021-12-18 ENCOUNTER — Inpatient Hospital Stay: Payer: Medicare HMO | Attending: Hematology and Oncology

## 2021-12-18 DIAGNOSIS — E1122 Type 2 diabetes mellitus with diabetic chronic kidney disease: Secondary | ICD-10-CM | POA: Insufficient documentation

## 2021-12-18 DIAGNOSIS — Z7901 Long term (current) use of anticoagulants: Secondary | ICD-10-CM | POA: Diagnosis not present

## 2021-12-18 DIAGNOSIS — C8333 Diffuse large B-cell lymphoma, intra-abdominal lymph nodes: Secondary | ICD-10-CM | POA: Diagnosis not present

## 2021-12-18 DIAGNOSIS — G4733 Obstructive sleep apnea (adult) (pediatric): Secondary | ICD-10-CM | POA: Diagnosis not present

## 2021-12-18 DIAGNOSIS — I129 Hypertensive chronic kidney disease with stage 1 through stage 4 chronic kidney disease, or unspecified chronic kidney disease: Secondary | ICD-10-CM | POA: Diagnosis not present

## 2021-12-18 DIAGNOSIS — D6481 Anemia due to antineoplastic chemotherapy: Secondary | ICD-10-CM | POA: Diagnosis not present

## 2021-12-18 DIAGNOSIS — T451X5D Adverse effect of antineoplastic and immunosuppressive drugs, subsequent encounter: Secondary | ICD-10-CM | POA: Diagnosis not present

## 2021-12-18 DIAGNOSIS — R319 Hematuria, unspecified: Secondary | ICD-10-CM | POA: Insufficient documentation

## 2021-12-18 DIAGNOSIS — I4891 Unspecified atrial fibrillation: Secondary | ICD-10-CM | POA: Insufficient documentation

## 2021-12-18 DIAGNOSIS — R600 Localized edema: Secondary | ICD-10-CM | POA: Diagnosis not present

## 2021-12-18 LAB — CBC WITH DIFFERENTIAL (CANCER CENTER ONLY)
Abs Immature Granulocytes: 0.02 10*3/uL (ref 0.00–0.07)
Basophils Absolute: 0 10*3/uL (ref 0.0–0.1)
Basophils Relative: 1 %
Eosinophils Absolute: 0.2 10*3/uL (ref 0.0–0.5)
Eosinophils Relative: 3 %
HCT: 31 % — ABNORMAL LOW (ref 39.0–52.0)
Hemoglobin: 9.6 g/dL — ABNORMAL LOW (ref 13.0–17.0)
Immature Granulocytes: 0 %
Lymphocytes Relative: 20 %
Lymphs Abs: 1.2 10*3/uL (ref 0.7–4.0)
MCH: 23.4 pg — ABNORMAL LOW (ref 26.0–34.0)
MCHC: 31 g/dL (ref 30.0–36.0)
MCV: 75.4 fL — ABNORMAL LOW (ref 80.0–100.0)
Monocytes Absolute: 0.7 10*3/uL (ref 0.1–1.0)
Monocytes Relative: 12 %
Neutro Abs: 3.8 10*3/uL (ref 1.7–7.7)
Neutrophils Relative %: 64 %
Platelet Count: 220 10*3/uL (ref 150–400)
RBC: 4.11 MIL/uL — ABNORMAL LOW (ref 4.22–5.81)
RDW: 14.8 % (ref 11.5–15.5)
WBC Count: 6 10*3/uL (ref 4.0–10.5)
nRBC: 0 % (ref 0.0–0.2)

## 2021-12-18 LAB — CMP (CANCER CENTER ONLY)
ALT: 36 U/L (ref 0–44)
AST: 34 U/L (ref 15–41)
Albumin: 3.9 g/dL (ref 3.5–5.0)
Alkaline Phosphatase: 105 U/L (ref 38–126)
Anion gap: 7 (ref 5–15)
BUN: 28 mg/dL — ABNORMAL HIGH (ref 8–23)
CO2: 28 mmol/L (ref 22–32)
Calcium: 9.1 mg/dL (ref 8.9–10.3)
Chloride: 104 mmol/L (ref 98–111)
Creatinine: 1.74 mg/dL — ABNORMAL HIGH (ref 0.61–1.24)
GFR, Estimated: 41 mL/min — ABNORMAL LOW (ref >60)
Glucose, Bld: 333 mg/dL — ABNORMAL HIGH (ref 70–99)
Potassium: 4.6 mmol/L (ref 3.5–5.1)
Sodium: 139 mmol/L (ref 135–145)
Total Bilirubin: 0.5 mg/dL (ref 0.3–1.2)
Total Protein: 5.9 g/dL — ABNORMAL LOW (ref 6.5–8.1)

## 2021-12-18 LAB — LACTATE DEHYDROGENASE: LDH: 172 U/L (ref 98–192)

## 2022-01-17 ENCOUNTER — Emergency Department (HOSPITAL_COMMUNITY): Payer: Medicare HMO

## 2022-01-17 ENCOUNTER — Inpatient Hospital Stay (HOSPITAL_COMMUNITY)
Admission: EM | Admit: 2022-01-17 | Discharge: 2022-01-22 | DRG: 871 | Disposition: A | Discharge status: Home / Self Care | Payer: Medicare HMO | Attending: Internal Medicine | Admitting: Internal Medicine

## 2022-01-17 DIAGNOSIS — G4733 Obstructive sleep apnea (adult) (pediatric): Secondary | ICD-10-CM | POA: Diagnosis present

## 2022-01-17 DIAGNOSIS — D638 Anemia in other chronic diseases classified elsewhere: Secondary | ICD-10-CM | POA: Diagnosis present

## 2022-01-17 DIAGNOSIS — Z7902 Long term (current) use of antithrombotics/antiplatelets: Secondary | ICD-10-CM

## 2022-01-17 DIAGNOSIS — I130 Hypertensive heart and chronic kidney disease with heart failure and stage 1 through stage 4 chronic kidney disease, or unspecified chronic kidney disease: Secondary | ICD-10-CM | POA: Diagnosis present

## 2022-01-17 DIAGNOSIS — E872 Acidosis, unspecified: Secondary | ICD-10-CM | POA: Diagnosis present

## 2022-01-17 DIAGNOSIS — E119 Type 2 diabetes mellitus without complications: Secondary | ICD-10-CM

## 2022-01-17 DIAGNOSIS — L0390 Cellulitis, unspecified: Secondary | ICD-10-CM | POA: Diagnosis present

## 2022-01-17 DIAGNOSIS — J9601 Acute respiratory failure with hypoxia: Secondary | ICD-10-CM | POA: Diagnosis present

## 2022-01-17 DIAGNOSIS — R042 Hemoptysis: Secondary | ICD-10-CM | POA: Diagnosis present

## 2022-01-17 DIAGNOSIS — N400 Enlarged prostate without lower urinary tract symptoms: Secondary | ICD-10-CM | POA: Diagnosis present

## 2022-01-17 DIAGNOSIS — I5043 Acute on chronic combined systolic (congestive) and diastolic (congestive) heart failure: Secondary | ICD-10-CM | POA: Diagnosis present

## 2022-01-17 DIAGNOSIS — Z794 Long term (current) use of insulin: Secondary | ICD-10-CM | POA: Diagnosis not present

## 2022-01-17 DIAGNOSIS — I712 Thoracic aortic aneurysm, without rupture, unspecified: Secondary | ICD-10-CM | POA: Diagnosis present

## 2022-01-17 DIAGNOSIS — I350 Nonrheumatic aortic (valve) stenosis: Secondary | ICD-10-CM | POA: Diagnosis present

## 2022-01-17 DIAGNOSIS — E785 Hyperlipidemia, unspecified: Secondary | ICD-10-CM | POA: Diagnosis present

## 2022-01-17 DIAGNOSIS — R6520 Severe sepsis without septic shock: Secondary | ICD-10-CM | POA: Diagnosis present

## 2022-01-17 DIAGNOSIS — I21A1 Myocardial infarction type 2: Secondary | ICD-10-CM | POA: Diagnosis present

## 2022-01-17 DIAGNOSIS — Z20822 Contact with and (suspected) exposure to covid-19: Secondary | ICD-10-CM | POA: Diagnosis present

## 2022-01-17 DIAGNOSIS — I509 Heart failure, unspecified: Secondary | ICD-10-CM

## 2022-01-17 DIAGNOSIS — I10 Essential (primary) hypertension: Secondary | ICD-10-CM | POA: Diagnosis present

## 2022-01-17 DIAGNOSIS — I2510 Atherosclerotic heart disease of native coronary artery without angina pectoris: Secondary | ICD-10-CM | POA: Diagnosis present

## 2022-01-17 DIAGNOSIS — C8330 Diffuse large B-cell lymphoma, unspecified site: Secondary | ICD-10-CM | POA: Diagnosis present

## 2022-01-17 DIAGNOSIS — E1165 Type 2 diabetes mellitus with hyperglycemia: Secondary | ICD-10-CM | POA: Diagnosis present

## 2022-01-17 DIAGNOSIS — L03115 Cellulitis of right lower limb: Secondary | ICD-10-CM | POA: Diagnosis present

## 2022-01-17 DIAGNOSIS — C8580 Other specified types of non-Hodgkin lymphoma, unspecified site: Secondary | ICD-10-CM | POA: Diagnosis not present

## 2022-01-17 DIAGNOSIS — E1122 Type 2 diabetes mellitus with diabetic chronic kidney disease: Secondary | ICD-10-CM | POA: Diagnosis present

## 2022-01-17 DIAGNOSIS — Z955 Presence of coronary angioplasty implant and graft: Secondary | ICD-10-CM

## 2022-01-17 DIAGNOSIS — N1832 Chronic kidney disease, stage 3b: Secondary | ICD-10-CM | POA: Diagnosis present

## 2022-01-17 DIAGNOSIS — A419 Sepsis, unspecified organism: Principal | ICD-10-CM | POA: Diagnosis present

## 2022-01-17 DIAGNOSIS — E7800 Pure hypercholesterolemia, unspecified: Secondary | ICD-10-CM | POA: Diagnosis not present

## 2022-01-17 DIAGNOSIS — I480 Paroxysmal atrial fibrillation: Secondary | ICD-10-CM | POA: Diagnosis present

## 2022-01-17 DIAGNOSIS — I5033 Acute on chronic diastolic (congestive) heart failure: Secondary | ICD-10-CM | POA: Diagnosis not present

## 2022-01-17 DIAGNOSIS — Z7901 Long term (current) use of anticoagulants: Secondary | ICD-10-CM

## 2022-01-17 DIAGNOSIS — E782 Mixed hyperlipidemia: Secondary | ICD-10-CM

## 2022-01-17 DIAGNOSIS — J189 Pneumonia, unspecified organism: Secondary | ICD-10-CM | POA: Diagnosis present

## 2022-01-17 DIAGNOSIS — Z8249 Family history of ischemic heart disease and other diseases of the circulatory system: Secondary | ICD-10-CM

## 2022-01-17 DIAGNOSIS — Z87440 Personal history of urinary (tract) infections: Secondary | ICD-10-CM

## 2022-01-17 DIAGNOSIS — Z8701 Personal history of pneumonia (recurrent): Secondary | ICD-10-CM

## 2022-01-17 DIAGNOSIS — I5021 Acute systolic (congestive) heart failure: Secondary | ICD-10-CM | POA: Diagnosis not present

## 2022-01-17 DIAGNOSIS — R0600 Dyspnea, unspecified: Secondary | ICD-10-CM

## 2022-01-17 DIAGNOSIS — R609 Edema, unspecified: Secondary | ICD-10-CM | POA: Diagnosis not present

## 2022-01-17 LAB — CBC WITH DIFFERENTIAL/PLATELET
Abs Immature Granulocytes: 0.08 10*3/uL — ABNORMAL HIGH (ref 0.00–0.07)
Basophils Absolute: 0.1 10*3/uL (ref 0.0–0.1)
Basophils Relative: 0 %
Eosinophils Absolute: 0.1 10*3/uL (ref 0.0–0.5)
Eosinophils Relative: 1 %
HCT: 34.6 % — ABNORMAL LOW (ref 39.0–52.0)
Hemoglobin: 10.8 g/dL — ABNORMAL LOW (ref 13.0–17.0)
Immature Granulocytes: 1 %
Lymphocytes Relative: 17 %
Lymphs Abs: 2.5 10*3/uL (ref 0.7–4.0)
MCH: 23.2 pg — ABNORMAL LOW (ref 26.0–34.0)
MCHC: 31.2 g/dL (ref 30.0–36.0)
MCV: 74.4 fL — ABNORMAL LOW (ref 80.0–100.0)
Monocytes Absolute: 1.2 10*3/uL — ABNORMAL HIGH (ref 0.1–1.0)
Monocytes Relative: 9 %
Neutro Abs: 10.5 10*3/uL — ABNORMAL HIGH (ref 1.7–7.7)
Neutrophils Relative %: 72 %
Platelets: 259 10*3/uL (ref 150–400)
RBC: 4.65 MIL/uL (ref 4.22–5.81)
RDW: 15.1 % (ref 11.5–15.5)
WBC: 14.4 10*3/uL — ABNORMAL HIGH (ref 4.0–10.5)
nRBC: 0 % (ref 0.0–0.2)

## 2022-01-17 LAB — COMPREHENSIVE METABOLIC PANEL
ALT: 36 U/L (ref 0–44)
AST: 36 U/L (ref 15–41)
Albumin: 3.6 g/dL (ref 3.5–5.0)
Alkaline Phosphatase: 97 U/L (ref 38–126)
Anion gap: 15 (ref 5–15)
BUN: 23 mg/dL (ref 8–23)
CO2: 19 mmol/L — ABNORMAL LOW (ref 22–32)
Calcium: 8.8 mg/dL — ABNORMAL LOW (ref 8.9–10.3)
Chloride: 103 mmol/L (ref 98–111)
Creatinine, Ser: 1.45 mg/dL — ABNORMAL HIGH (ref 0.61–1.24)
GFR, Estimated: 51 mL/min — ABNORMAL LOW (ref >60)
Glucose, Bld: 308 mg/dL — ABNORMAL HIGH (ref 70–99)
Potassium: 4.8 mmol/L (ref 3.5–5.1)
Sodium: 137 mmol/L (ref 135–145)
Total Bilirubin: 0.5 mg/dL (ref 0.3–1.2)
Total Protein: 5.5 g/dL — ABNORMAL LOW (ref 6.5–8.1)

## 2022-01-17 LAB — I-STAT ARTERIAL BLOOD GAS, ED
Acid-base deficit: 1 mmol/L (ref 0.0–2.0)
Bicarbonate: 23 mmol/L (ref 20.0–28.0)
Calcium, Ion: 1.19 mmol/L (ref 1.15–1.40)
HCT: 31 % — ABNORMAL LOW (ref 39.0–52.0)
Hemoglobin: 10.5 g/dL — ABNORMAL LOW (ref 13.0–17.0)
O2 Saturation: 99 %
Patient temperature: 98.4
Potassium: 4.4 mmol/L (ref 3.5–5.1)
Sodium: 137 mmol/L (ref 135–145)
TCO2: 24 mmol/L (ref 22–32)
pCO2 arterial: 33.9 mmHg (ref 32–48)
pH, Arterial: 7.44 (ref 7.35–7.45)
pO2, Arterial: 119 mmHg — ABNORMAL HIGH (ref 83–108)

## 2022-01-17 LAB — URINALYSIS, COMPLETE (UACMP) WITH MICROSCOPIC
Bacteria, UA: NONE SEEN
Bilirubin Urine: NEGATIVE
Glucose, UA: 150 mg/dL — AB
Ketones, ur: NEGATIVE mg/dL
Leukocytes,Ua: NEGATIVE
Nitrite: NEGATIVE
Protein, ur: NEGATIVE mg/dL
Specific Gravity, Urine: 1.011 (ref 1.005–1.030)
pH: 5 (ref 5.0–8.0)

## 2022-01-17 LAB — I-STAT CHEM 8, ED
BUN: 26 mg/dL — ABNORMAL HIGH (ref 8–23)
Calcium, Ion: 1.09 mmol/L — ABNORMAL LOW (ref 1.15–1.40)
Chloride: 105 mmol/L (ref 98–111)
Creatinine, Ser: 1.3 mg/dL — ABNORMAL HIGH (ref 0.61–1.24)
Glucose, Bld: 302 mg/dL — ABNORMAL HIGH (ref 70–99)
HCT: 33 % — ABNORMAL LOW (ref 39.0–52.0)
Hemoglobin: 11.2 g/dL — ABNORMAL LOW (ref 13.0–17.0)
Potassium: 4.7 mmol/L (ref 3.5–5.1)
Sodium: 137 mmol/L (ref 135–145)
TCO2: 23 mmol/L (ref 22–32)

## 2022-01-17 LAB — RESP PANEL BY RT-PCR (FLU A&B, COVID) ARPGX2
Influenza A by PCR: NEGATIVE
Influenza B by PCR: NEGATIVE
SARS Coronavirus 2 by RT PCR: NEGATIVE

## 2022-01-17 LAB — PHOSPHORUS: Phosphorus: 3.6 mg/dL (ref 2.5–4.6)

## 2022-01-17 LAB — TROPONIN I (HIGH SENSITIVITY): Troponin I (High Sensitivity): 28 ng/L — ABNORMAL HIGH (ref <18)

## 2022-01-17 LAB — BRAIN NATRIURETIC PEPTIDE: B Natriuretic Peptide: 220.4 pg/mL — ABNORMAL HIGH (ref 0.0–100.0)

## 2022-01-17 LAB — MAGNESIUM: Magnesium: 1.3 mg/dL — ABNORMAL LOW (ref 1.7–2.4)

## 2022-01-17 MED ORDER — MAGNESIUM SULFATE 2 GM/50ML IV SOLN
2.0000 g | Freq: Once | INTRAVENOUS | Status: AC
  Administered 2022-01-17: 2 g via INTRAVENOUS
  Filled 2022-01-17: qty 50

## 2022-01-17 MED ORDER — INSULIN ASPART 100 UNIT/ML IJ SOLN
0.0000 [IU] | INTRAMUSCULAR | Status: DC
  Administered 2022-01-18: 3 [IU] via SUBCUTANEOUS
  Administered 2022-01-18: 7 [IU] via SUBCUTANEOUS

## 2022-01-17 MED ORDER — IPRATROPIUM BROMIDE 0.02 % IN SOLN
0.5000 mg | Freq: Once | RESPIRATORY_TRACT | Status: AC
  Administered 2022-01-17: 0.5 mg via RESPIRATORY_TRACT
  Filled 2022-01-17: qty 2.5

## 2022-01-17 MED ORDER — ALBUTEROL SULFATE (2.5 MG/3ML) 0.083% IN NEBU
15.0000 mg | INHALATION_SOLUTION | Freq: Once | RESPIRATORY_TRACT | Status: AC
  Administered 2022-01-17: 15 mg via RESPIRATORY_TRACT
  Filled 2022-01-17: qty 18

## 2022-01-17 MED ORDER — SODIUM CHLORIDE 0.9 % IV SOLN
1.0000 g | Freq: Once | INTRAVENOUS | Status: AC
  Administered 2022-01-17: 1 g via INTRAVENOUS
  Filled 2022-01-17: qty 10

## 2022-01-17 MED ORDER — SODIUM CHLORIDE 0.9 % IV SOLN
500.0000 mg | Freq: Once | INTRAVENOUS | Status: AC
  Administered 2022-01-17: 500 mg via INTRAVENOUS
  Filled 2022-01-17: qty 5

## 2022-01-17 MED ORDER — MAGNESIUM SULFATE 2 GM/50ML IV SOLN
2.0000 g | Freq: Once | INTRAVENOUS | Status: AC
  Administered 2022-01-18: 2 g via INTRAVENOUS
  Filled 2022-01-17: qty 50

## 2022-01-17 MED ORDER — FUROSEMIDE 10 MG/ML IJ SOLN
40.0000 mg | Freq: Once | INTRAMUSCULAR | Status: AC
  Administered 2022-01-17: 40 mg via INTRAVENOUS
  Filled 2022-01-17: qty 4

## 2022-01-17 NOTE — Assessment & Plan Note (Signed)
Will replace ?

## 2022-01-17 NOTE — Subjective & Objective (Signed)
Pt came in with significant SOB EMS administered albuterol, Solumedrol 125 ?Noted sating 81% on RA started on CPAP improved to 94% ?IN ER transitioned to biPAP ?

## 2022-01-17 NOTE — ED Provider Notes (Signed)
[facility] EMERGENCY DEPARTMENT Provider Note   CSN: [id number] Arrival date & time: 01/17/22  2144     History  Chief Complaint  Patient presents with   Respiratory Distress    [name] is a 75 y.o. male history of B-cell lymphoma, UTIs, here presenting with respiratory distress.  Patient has progressive leg swelling for the last several days.  Patient was at church with his daughter and she noticed that he appeared short of breath.  This evening, patient had worsening shortness of breath and called her daughter states that he cannot breathe.  Patient was noted to be hypoxic and was 81% on room air.  Patient is on Eliquis.  Patient is not very compliant with his Lasix.  Patient has been followed up at [facility] and there is no history of heart failure.  I talked to the daughter Alma Friendly, hospitalist at [facility], [phone]) and she states that patient gets recurrent UTIs and pneumonias.  Patient was put on CPAP by EMS  The history is provided by the patient and a relative. The history is limited by a language barrier. A language interpreter was used.      Home Medications Prior to Admission medications   Not on File      Allergies    Patient has no allergy information on record.    Review of Systems   Review of Systems  Respiratory:  Positive for shortness of breath.   All other systems reviewed and are negative.  Physical Exam Updated Vital Signs BP (!) 157/74    Pulse (!) 103    Temp 98.4 F (36.9 C) (Oral)    Resp (!) 31    SpO2 98%  Physical Exam Vitals and nursing note reviewed.  Constitutional:      Comments: Tachypneic, talking in 2-3 word sentence  HENT:     Head: Normocephalic.     Nose: Nose normal.     Mouth/Throat:     Mouth: Mucous membranes are dry.  Eyes:     Extraocular Movements: Extraocular movements intact.     Pupils: Pupils are equal, round, and reactive to light.  Cardiovascular:     Rate and Rhythm: Tachycardia present.      Pulses: Normal pulses.  Pulmonary:     Comments: Tachypneic, crackles bilateral bases Abdominal:     General: Abdomen is flat.     Palpations: Abdomen is soft.  Musculoskeletal:     Cervical back: Normal range of motion and neck supple.     Comments: 2+ pitting edema  Skin:    General: Skin is warm.     Capillary Refill: Capillary refill takes less than 2 seconds.  Neurological:     General: No focal deficit present.     Mental Status: He is oriented to person, place, and time.  Psychiatric:        Mood and Affect: Mood normal.        Behavior: Behavior normal.    ED Results / Procedures / Treatments   Labs (all labs ordered are listed, but only abnormal results are displayed) Labs Reviewed  CBC WITH DIFFERENTIAL/PLATELET - Abnormal; Notable for the following components:      Result Value   WBC 14.4 (*)    Hemoglobin 10.8 (*)    HCT 34.6 (*)    MCV 74.4 (*)    MCH 23.2 (*)    Neutro Abs 10.5 (*)    Monocytes Absolute 1.2 (*)    Abs Immature  Granulocytes 0.08 (*)    All other components within normal limits  COMPREHENSIVE METABOLIC PANEL - Abnormal; Notable for the following components:   CO2 19 (*)    Glucose, Bld 308 (*)    Creatinine, Ser 1.45 (*)    Calcium 8.8 (*)    Total Protein 5.5 (*)    GFR, Estimated 51 (*)    All other components within normal limits  BRAIN NATRIURETIC PEPTIDE - Abnormal; Notable for the following components:   B Natriuretic Peptide 220.4 (*)    All other components within normal limits  I-STAT CHEM 8, ED - Abnormal; Notable for the following components:   BUN 26 (*)    Creatinine, Ser 1.30 (*)    Glucose, Bld 302 (*)    Calcium, Ion 1.09 (*)    Hemoglobin 11.2 (*)    HCT 33.0 (*)    All other components within normal limits  I-STAT ARTERIAL BLOOD GAS, ED - Abnormal; Notable for the following components:   pO2, Arterial 119 (*)    HCT 31.0 (*)    Hemoglobin 10.5 (*)    All other components within normal limits  TROPONIN I  (HIGH SENSITIVITY) - Abnormal; Notable for the following components:   Troponin I (High Sensitivity) 28 (*)    All other components within normal limits  RESP PANEL BY RT-PCR (FLU A&B, COVID) ARPGX2  CULTURE, BLOOD (ROUTINE X 2)  CULTURE, BLOOD (ROUTINE X 2)  URINE CULTURE  BLOOD GAS, ARTERIAL  LACTIC ACID, PLASMA  LACTIC ACID, PLASMA  URINALYSIS, ROUTINE W REFLEX MICROSCOPIC    EKG None  Radiology DG Chest Port 1 View  Result Date: 01/17/2022 CLINICAL DATA:  Shortness of breath. EXAM: PORTABLE CHEST 1 VIEW COMPARISON:  Chest x-ray 05/23/2021. FINDINGS: The heart is enlarged. There are patchy airspace opacities in both lung bases. No large pleural effusion or pneumothorax. No acute fractures. IMPRESSION: 1. Bilateral lower lobe airspace disease worrisome for multifocal pneumonia and/or edema. 2. Stable cardiomegaly. Electronically Signed   By: Ronney Asters M.D.   On: 01/17/2022 22:15    Procedures Procedures    CRITICAL CARE Performed by: Wandra Arthurs   Total critical care time: 30 minutes  Critical care time was exclusive of separately billable procedures and treating other patients.  Critical care was necessary to treat or prevent imminent or life-threatening deterioration.  Critical care was time spent personally by me on the following activities: development of treatment plan with patient and/or surrogate as well as nursing, discussions with consultants, evaluation of patient's response to treatment, examination of patient, obtaining history from patient or surrogate, ordering and performing treatments and interventions, ordering and review of laboratory studies, ordering and review of radiographic studies, pulse oximetry and re-evaluation of patient's condition.   Medications Ordered in ED Medications  magnesium sulfate IVPB 2 g 50 mL (2 g Intravenous New Bag/Given 01/17/22 2203)  cefTRIAXone (ROCEPHIN) 1 g in sodium chloride 0.9 % 100 mL IVPB (has no administration in time  range)  azithromycin (ZITHROMAX) 500 mg in sodium chloride 0.9 % 250 mL IVPB (has no administration in time range)  albuterol (PROVENTIL) (2.5 MG/3ML) 0.083% nebulizer solution 15 mg (15 mg Nebulization Given 01/17/22 2207)  ipratropium (ATROVENT) nebulizer solution 0.5 mg (0.5 mg Nebulization Given 01/17/22 2207)  furosemide (LASIX) injection 40 mg (40 mg Intravenous Given 01/17/22 2210)    ED Course/ Medical Decision Making/ A&P  Medical Decision Making PHU RECORD is a 76 y.o. male here presenting with shortness of breath and leg swelling. Leg swelling for several days and aute onset of shortness of breath today. Patient is put on CPAP by EMS.  Differential include COPD exacerbation versus CHF versus pneumonia.  Patient is on Eliquis so I doubt PE.  Plan to get CBC and CMP and troponin and BNP and chest x-ray.  I discussed with the daughter who is a hospitalist at [employer].  She gave me some details regarding the patient's history. She states that patient has recurrent UTIs and pneumonias.   11:17 PM White blood cell count is 14.  Chest x-ray showed possible pneumonia.  BNP is also elevated at 220.  Patient appears volume overloaded on exam.  Given Lasix in the ED. also given IV antibiotics.  ABG is reassuring. Patient will be admitted for hypoxia, respiratory distress secondary to pneumonia, CHF   Problems Addressed: Community acquired pneumonia of right lower lobe of lung: acute illness or injury Congestive heart failure, unspecified HF chronicity, unspecified heart failure type ([place]): acute illness or injury  Amount and/or Complexity of Data Reviewed Labs: ordered. Decision-making details documented in ED Course. Radiology: ordered and independent interpretation performed. Decision-making details documented in ED Course. ECG/medicine tests: ordered and independent interpretation performed. Decision-making details documented in ED Course.  Risk Prescription drug  management. Decision regarding hospitalization.  Final Clinical Impression(s) / ED Diagnoses Final diagnoses:  None    Rx / DC Orders ED Discharge Orders     None         Drenda Freeze, MD 01/17/22 2318

## 2022-01-17 NOTE — ED Notes (Signed)
Daughter Alden Benjamin [phone] ?

## 2022-01-17 NOTE — Progress Notes (Signed)
Patient arrived via EMS on CPAP. Placed on BIPAP, CAT started.  ?

## 2022-01-17 NOTE — ED Triage Notes (Signed)
Pt via [facility], respiratory distress. EMT reports rhonci, rales heard all over. '5mg'$  albuterol, 125 solumedrol. Hx lasix, on eliquis for stent. BLE ?81% RA -> 94% CPAP ? ?20 R hand ?Pt placed on 5L [place] for triage to assess, pt O2 sat 93% ?

## 2022-01-17 NOTE — H&P (Addendum)
[name] SEG:[id number] DOB: 1946/04/11 DOA: 01/17/2022     PCP: [name], MD   Outpatient Specialists:  CARDS:  Dr. Geanie Berlin  Oncology   Dr. Lorenso Courier   Urology Dr. Junious Silk ER on 01/17/22 at 2144 Referred by Attending Drenda Freeze, MD   Patient coming from:    home Lives  With family    Chief Complaint:   Chief Complaint  Patient presents with   Respiratory Distress    HPI: [name] is a 76 y.o. male with medical history significant of 76 y.o. male with medical history significant of DM2, CKD, Right kideny Hydronephrosis, diffuse non-Hodgkin lymphoma, Anemia, HTN, OSA, CAD s/p PCI with DES to RCA 11/2020 on Plavix, PAF on Eliquis and amiodarone, NSVT,  Presented with severe shortness of breath Family went to church he was appeared with swollen legs He came home they took a walk when he came back he was very short of breath and started to struggle to breath Wife felt he had a  fever  Pt came in with significant SOB EMS administered albuterol, Solumedrol 125 Noted sating 81% on RA started on CPAP improved to 94% IN ER transitioned to biPAP  Low grade fever at home sepsis in July and had concerns for endocarditis treated with 6 weeks of antibiotics via PICC line.    Initial COVID TEST  NEGATIVE   Lab Results  Component Value Date   [place] NEGATIVE 01/17/2022     Regarding pertinent Chronic problems:   Hyperlipidemia - on statins lipitor     HTN on lopressor, indur, norvasc    chronic combined systolic diastolic CHF - last echo 7 /2022  EF 73-71% diastolic CHF   CAD  - On Aspirin, statin, betablocker, Plavix              Sp DES 11/2020    DM 2 -  Recent Labs       Lab Results  Component Value Date    HGBA1C 7.7 (H) 05/01/2021     on insulin,       obesity-      BMI Readings from Last 1 Encounters:  05/13/21 36.20 kg/m     OSA -on nocturnal o CPAP,        A. 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    Chronic anemia - baseline hg Hemoglobin & Hematocrit  Recent Labs    01/17/22 2154 01/17/22 2202 01/17/22 2217  HGB 10.8* 11.2* 10.5*     While in ER:    On BIPAP   Ordered    CXR - Bilateral lower lobe airspace disease worrisome for multifocal pneumonia and/or edema.     Following Medications were ordered in ER: Medications  magnesium sulfate IVPB 2 g 50 mL (2 g Intravenous New Bag/Given 01/17/22 2203)  cefTRIAXone (ROCEPHIN) 1 g in sodium chloride 0.9 % 100 mL IVPB (has no administration in time range)  azithromycin (ZITHROMAX) 500 mg in sodium chloride 0.9 % 250 mL IVPB (has no administration in time range)  albuterol (PROVENTIL) (  2.5 MG/3ML) 0.083% nebulizer solution 15 mg (15 mg Nebulization Given 01/17/22 2207)  ipratropium (ATROVENT) nebulizer solution 0.5 mg (0.5 mg Nebulization Given 01/17/22 2207)  furosemide (LASIX) injection 40 mg (40 mg Intravenous Given 01/17/22 2210)       ED Triage Vitals  Enc Vitals Group     BP 01/17/22 2148 (!) 166/65     Pulse Rate 01/17/22 2148 (!) 120     Resp 01/17/22 2148 (!) 26     Temp 01/17/22 2156 98.7 F (37.1 C)     Temp Source 01/17/22 2156 Oral     SpO2 01/17/22 2148 92 %     Weight --      Height --      Head Circumference --      Peak Flow --      Pain Score 01/17/22 2147 2     Pain Loc --      Pain Edu? --      Excl. in [place]? --   TMAX(24)@     _________________________________________ Significant initial  Findings: Abnormal Labs Reviewed  CBC WITH DIFFERENTIAL/PLATELET - Abnormal; Notable for the following components:      Result Value   WBC 14.4 (*)    Hemoglobin 10.8 (*)    HCT 34.6 (*)    MCV 74.4 (*)    MCH 23.2 (*)    Neutro Abs 10.5  (*)    Monocytes Absolute 1.2 (*)    Abs Immature Granulocytes 0.08 (*)    All other components within normal limits  COMPREHENSIVE METABOLIC PANEL - Abnormal; Notable for the following components:   CO2 19 (*)    Glucose, Bld 308 (*)    Creatinine, Ser 1.45 (*)    Calcium 8.8 (*)    Total Protein 5.5 (*)    GFR, Estimated 51 (*)    All other components within normal limits  BRAIN NATRIURETIC PEPTIDE - Abnormal; Notable for the following components:   B Natriuretic Peptide 220.4 (*)    All other components within normal limits  I-STAT CHEM 8, ED - Abnormal; Notable for the following components:   BUN 26 (*)    Creatinine, Ser 1.30 (*)    Glucose, Bld 302 (*)    Calcium, Ion 1.09 (*)    Hemoglobin 11.2 (*)    HCT 33.0 (*)    All other components within normal limits  I-STAT ARTERIAL BLOOD GAS, ED - Abnormal; Notable for the following components:   pO2, Arterial 119 (*)    HCT 31.0 (*)    Hemoglobin 10.5 (*)    All other components within normal limits  TROPONIN I (HIGH SENSITIVITY) - Abnormal; Notable for the following components:   Troponin I (High Sensitivity) 28 (*)    All other components within normal limits    _________________________ Troponin 28 ECG: Ordered Personally reviewed by me showing: HR : 116 Rhythm: Sinus or ectopic atrial tachycardia Left anterior fascicular block Abnormal R-wave progression, QTC 467     The recent clinical data is shown below. Vitals:   01/17/22 2156 01/17/22 2157 01/17/22 2211 01/17/22 2215  BP:  (!) 166/65  (!) 157/74  Pulse:  (!) 115  (!) 103  Resp:  (!) 32  (!) 31  Temp: 98.7 F (37.1 C)  98.4 F (36.9 C)   TempSrc: Oral  Oral   SpO2:  96%  98%    WBC     Component Value Date/Time   WBC 14.4 (H) 01/17/2022 2154  LYMPHSABS 2.5 01/17/2022 2154   MONOABS 1.2 (H) 01/17/2022 2154   EOSABS 0.1 01/17/2022 2154   BASOSABS 0.1 01/17/2022 2154      Procalcitonin   Ordered Lactic Acid, Venous    Component Value  Date/Time   LATICACIDVEN 2.7 (HH) 01/17/2022 2214     UA   no evidence of UTI      Urine analysis:    Component Value Date/Time   COLORURINE YELLOW 01/17/2022 2330   APPEARANCEUR CLEAR 01/17/2022 2330   LABSPEC 1.011 01/17/2022 2330   PHURINE 5.0 01/17/2022 2330   GLUCOSEU 150 (A) 01/17/2022 2330   HGBUR SMALL (A) 01/17/2022 2330   BILIRUBINUR NEGATIVE 01/17/2022 2330   KETONESUR NEGATIVE 01/17/2022 2330   PROTEINUR NEGATIVE 01/17/2022 2330   NITRITE NEGATIVE 01/17/2022 2330   LEUKOCYTESUR NEGATIVE 01/17/2022 2330    No results found for this or any previous visit.   _____________________________________ Hospitalist was called for admission for CAP  The following Work up has been ordered so far:  Orders Placed This Encounter  Procedures   Resp Panel by RT-PCR (Flu A&B, Covid) Nasopharyngeal Swab   Blood culture (routine x 2)   Urine Culture   DG Chest Port 1 View   Blood gas, arterial   CBC with Differential/Platelet   Comprehensive metabolic panel   Brain natriuretic peptide   Lactic acid, plasma   Urinalysis, Routine w reflex microscopic   Cardiac monitoring   Utilize spacer/aerochamber with mdi inhaler for COVID-19 positive patients or PUI for COVID-19   If O2 Sat <94% administer O2 at 2 liters/minute via nasal cannula   Consult for [facility] Admission   Pulse oximetry, continuous   Adult wheeze protocol - initiate by RT   I-stat chem 8, ED (not at [facility] or [facility])   I-Stat arterial blood gas, ED   ED EKG   EKG 12-Lead     OTHER Significant initial  Findings:  labs showing:    Recent Labs  Lab 01/17/22 2154 01/17/22 2202 01/17/22 2217  NA 137 137 137  K 4.8 4.7 4.4  CO2 19*  --   --   GLUCOSE 308* 302*  --   BUN 23 26*  --   CREATININE 1.45* 1.30*  --   CALCIUM 8.8*  --   --     Cr  Up from baseline see below Lab Results  Component Value Date   CREATININE 1.30 (H) 01/17/2022   CREATININE 1.45 (H) 01/17/2022    Recent Labs  Lab  01/17/22 2154  AST 36  ALT 36  ALKPHOS 97  BILITOT 0.5  PROT 5.5*  ALBUMIN 3.6   Lab Results  Component Value Date   CALCIUM 8.8 (L) 01/17/2022        Plt: Lab Results  Component Value Date   PLT 259 01/17/2022      COVID-19 Labs  No results for input(s): DDIMER, FERRITIN, LDH, CRP in the last 72 hours.  No results found for: SARSCOV2NAA    ABG    Component Value Date/Time   PHART 7.440 01/17/2022 2217   PCO2ART 33.9 01/17/2022 2217   PO2ART 119 (H) 01/17/2022 2217   HCO3 23.0 01/17/2022 2217   TCO2 24 01/17/2022 2217   ACIDBASEDEF 1.0 01/17/2022 2217   O2SAT 99 01/17/2022 2217      Recent Labs  Lab 01/17/22 2154 01/17/22 2202 01/17/22 2217  WBC 14.4*  --   --   NEUTROABS 10.5*  --   --   HGB 10.8*  11.2* 10.5*  HCT 34.6* 33.0* 31.0*  MCV 74.4*  --   --   PLT 259  --   --     HG/HCT  stable,     Component Value Date/Time   HGB 10.5 (L) 01/17/2022 2217   HCT 31.0 (L) 01/17/2022 2217   MCV 74.4 (L) 01/17/2022 2154      Cardiac Panel (last 3 results) No results for input(s): CKTOTAL, CKMB, TROPONINI, RELINDX in the last 72 hours.  .car BNP (last 3 results) Recent Labs    01/17/22 2154  BNP 220.4*     DM  labs:  HbA1C: Recent Labs    01/18/22 0021  HGBA1C 9.0*       CBG (last 3)  No results for input(s): GLUCAP in the last 72 hours.    Cultures: No results found for: [place], [place], CULT, REPTSTATUS   Radiological Exams on Admission: DG Chest Port 1 View  Result Date: 01/17/2022 CLINICAL DATA:  Shortness of breath. EXAM: PORTABLE CHEST 1 VIEW COMPARISON:  Chest x-ray 05/23/2021. FINDINGS: The heart is enlarged. There are patchy airspace opacities in both lung bases. No large pleural effusion or pneumothorax. No acute fractures. IMPRESSION: 1. Bilateral lower lobe airspace disease worrisome for multifocal pneumonia and/or edema. 2. Stable cardiomegaly. Electronically Signed   By: Ronney Asters M.D.   On: 01/17/2022 22:15    _______________________________________________________________________________________________________ Latest   Blood pressure (!) 157/74, pulse (!) 103, temperature 98.4 F (36.9 C), temperature source Oral, resp. rate (!) 31, SpO2 98 %.   Vitals  labs and radiology finding personally reviewed  Review of Systems:    Pertinent positives include: shortness of breath at rest.  dyspnea on exertion,   Constitutional:  No weight loss, night sweats, Fevers, chills, fatigue, weight loss  HEENT:  No headaches, Difficulty swallowing,Tooth/dental problems,Sore throat,  No sneezing, itching, ear ache, nasal congestion, post nasal drip,  Cardio-vascular:  No chest pain, Orthopnea, PND, anasarca, dizziness, palpitations.no Bilateral lower extremity swelling  GI:  No heartburn, indigestion, abdominal pain, nausea, vomiting, diarrhea, change in bowel habits, loss of appetite, melena, blood in stool, hematemesis Resp:  no No excess mucus, no productive cough, No non-productive cough, No coughing up of blood.No change in color of mucus.No wheezing. Skin:  no rash or lesions. No jaundice GU:  no dysuria, change in color of urine, no urgency or frequency. No straining to urinate.  No flank pain.  Musculoskeletal:  No joint pain or no joint swelling. No decreased range of motion. No back pain.  Psych:  No change in mood or affect. No depression or anxiety. No memory loss.  Neuro: no localizing neurological complaints, no tingling, no weakness, no double vision, no gait abnormality, no slurred speech, no confusion  All systems reviewed and apart from [place] all are negative _______________________________________________________________________________________________ Past Medical History: Atrial fibrillation diabetes combined CHF  Social History: Does not smoke or drink does not vape  Ambulatory   cane, walker       has no history on file for tobacco use, alcohol use, and drug use.      Family History: Not contributory ______________________________________________________________________________________________ Allergies: Not on File   Prior to Admission medications   Not on File    amiodarone (PACERONE) 200 MG tablet Take 200 mg by mouth every morning.       clopidogrel (PLAVIX) 75 MG tablet Take 75 mg by mouth daily.       acetaminophen (TYLENOL) 650 MG CR tablet Take 650 mg by mouth  every 8 (eight) hours as needed for pain or fever (headache). (Patient not taking: Reported on 09/02/2021)       allopurinol (ZYLOPRIM) 300 MG tablet Take 1 tablet (300 mg total) by mouth daily. (Patient not taking: Reported on 11/06/2021) 90 tablet 1   amLODipine (NORVASC) 10 MG tablet Take 1 tablet by mouth once daily 30 tablet 0   apixaban (ELIQUIS) 5 MG TABS tablet Take 5 mg by mouth 2 (two) times daily.       atorvastatin (LIPITOR) 20 MG tablet Take 20 mg by mouth at bedtime.       finasteride (PROSCAR) 5 MG tablet Take 5 mg by mouth at bedtime.       furosemide (LASIX) 20 MG tablet Take 1 tablet (20 mg total) by mouth daily. 30 tablet 1   glipiZIDE (GLUCOTROL) 10 MG tablet Take 10 mg by mouth 2 (two) times daily.       Heparin Sodium Flush 100-0.9 UNIT/ML-% KIT Inject 500 Units into the vein 2 (two) times a week. Flush PICC with 250 units (1/2 syringe) 2 x weekly   discard remaining 12 kit 1   insulin glargine (LANTUS) 100 unit/mL SOPN Inject 34 Units into the skin at bedtime.       isosorbide mononitrate (IMDUR) 30 MG 24 hr tablet Take 30 mg by mouth every morning.       metFORMIN (GLUCOPHAGE) 1000 MG tablet Take 1,000 mg by mouth 2 (two) times daily.       metoprolol tartrate (LOPRESSOR) 50 MG tablet Take 1 tablet (50 mg total) by mouth 2 (two) times daily. 60 tablet 1   nitroGLYCERIN (NITROSTAT) 0.3 MG SL tablet Place 0.3 mg under the tongue every 5 (five) minutes x 3 doses as needed for chest pain. (Patient not taking: Reported on 09/02/2021)       oxyCODONE (OXY IR/ROXICODONE) 5  MG immediate release tablet Take 1 tablet (5 mg total) by mouth every 4 (four) hours as needed for severe pain. (Patient not taking: Reported on 11/06/2021) 20 tablet 0   prochlorperazine (COMPAZINE) 10 MG tablet Take 1 tablet (10 mg total) by mouth every 6 (six) hours as needed for nausea or vomiting. (Patient not taking: Reported on 09/02/2021) 30 tablet 0   Sodium Chloride Flush (NORMAL SALINE FLUSH) 0.9 % SOLN Flush PICC line with 61m 1-2 times a week as directed (Patient not taking: Reported on 09/02/2021) 120      ___________________________________________________________________________________________________ Physical Exam: Vitals with BMI 01/17/2022 38/7/867637/12/945 Systolic [id number] Diastolic 74 65 65  Pulse [id number] 120     1. General:  in No  Acute distress   Chronically ill   -appearing 2. Psychological: Alert and  Oriented 3. Head/ENT:   Dry Mucous Membranes                          Head Non traumatic, neck supple                         Poor Dentition 4. SKIN:  decreased Skin turgor,  Skin clean Dry and intact no rash 5. Heart: Regular rate and rhythm mild Murmur, no Rub or gallop 6. Lungs:  Clear to auscultation bilaterally, no wheezes or crackles   7. Abdomen: Soft,  non-tender, Non distended   obese  bowel sounds present 8. Lower extremities: no clubbing, cyanosis,  2+ edema right > Left 9. Neurologically Grossly  intact, moving all 4 extremities equally   10. MSK: Normal range of motion    Chart has been reviewed  ______________________________________________________________________________________________  Assessment/Plan  76 y.o. male with medical history significant of 76 y.o. male with medical history significant of DM2, CKD, Right kideny Hydronephrosis, diffuse non-Hodgkin lymphoma, Anemia, HTN, OSA, CAD s/p PCI with DES to RCA 11/2020, PAF on Eliquis and amiodarone, NSVT,  Admitted for CHF exacerbation vs CAP  Present on Admission:  HTN  (hypertension)  HLD (hyperlipidemia)  Paroxysmal A-fib ([place])  Diffuse non-Hodgkin's lymphoma ([place])  CAP (community acquired pneumonia)  Hypomagnesemia  Cellulitis  Sepsis ([place])  Acute respiratory failure with hypoxia ([place])  Acute on chronic combined systolic and diastolic CHF (congestive heart failure) (HCC)     DM2 (diabetes mellitus, type 2) ([place])  - Order Sensitive   SSI   decrease lantus to 25units  -  check TSH and HgA1C  - Hold by mouth medications     Diffuse non-Hodgkin's lymphoma ([place]) Followed by Dr. Lorenso Courier undergone chemotherapy and had good response We will email oncology patient's been admitted  Hypomagnesemia Will replace  CAP (community acquired pneumonia)  - -Patient presenting with  productive cough, fever    Hypoxia and infiltrate  -Infiltrate on CXR and 2-3 characteristics (fever, leukocytosis, purulent sputum) are consistent with pneumonia. -This appears to be most likely community-acquired pneumonia.     will admit for treatment of CAP will start on appropriate antibiotic coverage. - Rocephin/doxy   Obtain:  sputum cultures,                 Obtain respiratory panel                    influenza serologies negative                  COVID PCR negative                  blood cultures and sputum cultures ordered                   strep pneumo UA antigen,                  check for Legionella antigen.                Provide oxygen as needed.  BIPAP PRN At this point unable to obtain CT with contrast on xarelto PE less likely May need additional imaging to clarify PNA vs CHF   HTN (hypertension) Chronic stable continue home medications  HLD (hyperlipidemia) Continue Lipitor  Paroxysmal A-fib (HCC) Continue Eliquis and metoprolol monitor blood pressure  Cellulitis Right lower extremity appear to be swollen and red consistent with mild cellulitis will cover for now that doxycycline at order cellulitis protocol  Sepsis ([place])  -SIRS criteria met  with elevated white blood cell count,       Component Value Date/Time   WBC 14.4 (H) 01/17/2022 2154   LYMPHSABS 2.5 01/17/2022 2154     tachycardia   ,  fever   RR >20 Today's Vitals   01/18/22 0000 01/18/22 0002 01/18/22 0005 01/18/22 0015  BP: 128/75 (!) 114/55  (!) 124/56  Pulse: (!) 105 (!) 102  99  Resp: (!) 25 (!) 27  (!) 28  Temp:      TempSrc:      SpO2: 95% 97% 97% 93%  PainSc:          -Most likely source being  pulmonary   Patient meeting criteria for Severe sepsis with    evidence of end organ damage/organ dysfunction such as      elevated lactic acid >2     Component Value Date/Time   LATICACIDVEN 2.7 (HH) 01/17/2022 2214       Acute hypoxia requiring new supplemental oxygen, SpO2: 93 % O2 Flow Rate (L/min): 5 L/min FiO2 (%): 40 %    - Obtain serial lactic acid and procalcitonin level.  - Initiated IV antibiotics in ER: Antibiotics Given (last 72 hours)     Date/Time Action Medication Dose Rate   01/17/22 2308 New Bag/Given   azithromycin (ZITHROMAX) 500 mg in sodium chloride 0.9 % 250 mL IVPB 500 mg 250 mL/hr   01/17/22 2310 New Bag/Given   cefTRIAXone (ROCEPHIN) 1 g in sodium chloride 0.9 % 100 mL IVPB 1 g 200 mL/hr       Will continue  on Rocephin doxycycline   - await results of blood and urine culture    12:59 AM   Acute respiratory failure with hypoxia (HCC)  this patient has acute respiratory failure with Hypoxia   as documented by the presence of following: O2 saturatio< 90% on RA Likely due to:  Pneumonia, CHF exacerbation  Provide O2 therapy and titrate as needed  Continuous pulse ox  check Pulse ox with ambulation prior to discharge   may need  TC consult for home O2 set up    Acute on chronic combined systolic and diastolic CHF (congestive heart failure) ([place]) - Pt diagnosed with CHF based on presence of the following , OA, rales on exam, JVD, cardiomegaly, Pulmonary edema on CXR, and  bilateral leg edema, DOE  With noted  response to IV diuretic in ER  admit on telemetry,  cycle cardiac enzymes, Troponin  28 obtain serial ECG  to evaluate for ischemia as a cause of heart failure  monitor daily weight: There were no vitals filed for this visit. Last BNP BNP (last 3 results) Recent Labs    01/17/22 2154  BNP 220.4*    got one dose of lasix in ER given elevated lactic acid wll hold off on more for now.  Need to reassess in am   monitor orthostatics and creatinine to avoid over diuresis.  Order echogram to evaluate EF and valves     cardiology  emailed   Lactic acidosis Possibly due to increased work of breathing Pt doing a bit better after lasix would hold off on aggressive fluids given pt appears fluid up With stable bp and improving   Other plan as per orders.  DVT prophylaxis: on eliquis   Code Status:    Code Status: Not on file FULL CODE  as per patient   I had personally discussed CODE STATUS with patient and family    Family Communication:   Family not at  Bedside  plan of care was discussed on the phone with  Daughter,    Disposition Plan:      To home once workup is complete and patient is stable   Following barriers for discharge:                            Electrolytes corrected                              Afebrile, white count improving able to transition to PO antibiotics  Will need to be able to tolerate PO                            Will likely need home health, home O2, set up                           Will need consultants to evaluate patient prior to discharge                        Would benefit from PT/OT eval prior to DC  Ordered                    Nutrition    consulted                                      Consults called:   emailed oncology,  emailed cardiology   Admission status:  ED Disposition     None        inpatient     I Expect 2 midnight stay secondary to severity of patient's current illness need for inpatient  interventions justified by the following:  hemodynamic instability despite optimal treatment (tachycardia  hypoxia,  )  Severe lab/radiological/exam abnormalities including:    CAP vs CHF and extensive comorbidities including:    DM2    CHF   Morbid Obesity  CKD     malignancy,   Chronic anticoagulation  That are currently affecting medical management.   I expect  patient to be hospitalized for 2 midnights requiring inpatient medical care.  Patient is at high risk for adverse outcome (such as loss of life or disability) if not treated.  Indication for inpatient stay as follows:    New or worsening hypoxia   Need for IV antibiotics, need for biPAP    Level of care         progressive tele indefinitely please discontinue once patient no longer qualifies COVID-19 Labs    Lab Results  Component Value Date   [place] 01/17/2022     Precautions: admitted as  Covid Negative       Maxine Fredman 01/18/2022, 12:53 AM    [facility]     after 2 AM please page floor coverage PA If 7AM-7PM, please contact the day team taking care of the patient using [email or web address]   Patient was evaluated in the context of the global COVID-19 pandemic, which necessitated consideration that the patient might be at risk for infection with the SARS-CoV-2 virus that causes COVID-19. Institutional protocols and algorithms that pertain to the evaluation of patients at risk for COVID-19 are in a state of rapid change based on information released by regulatory bodies including the CDC and federal and state organizations. These policies and algorithms were followed during the patient's care.

## 2022-01-17 NOTE — Assessment & Plan Note (Addendum)
Followed by Dr. Lorenso Courier undergone chemotherapy and had good response ?We will email oncology patient's been admitted ?

## 2022-01-17 NOTE — Assessment & Plan Note (Addendum)
-   Order Sensitive   SSI  ? decrease lantus to 25units ? -  check TSH and HgA1C ? - Hold by mouth medications  ? ? ?

## 2022-01-18 ENCOUNTER — Inpatient Hospital Stay (HOSPITAL_COMMUNITY): Payer: Medicare HMO

## 2022-01-18 ENCOUNTER — Other Ambulatory Visit: Payer: Self-pay | Admitting: Hematology and Oncology

## 2022-01-18 DIAGNOSIS — R609 Edema, unspecified: Secondary | ICD-10-CM

## 2022-01-18 DIAGNOSIS — I5021 Acute systolic (congestive) heart failure: Secondary | ICD-10-CM

## 2022-01-18 DIAGNOSIS — J189 Pneumonia, unspecified organism: Secondary | ICD-10-CM | POA: Diagnosis not present

## 2022-01-18 DIAGNOSIS — J9601 Acute respiratory failure with hypoxia: Secondary | ICD-10-CM | POA: Diagnosis present

## 2022-01-18 DIAGNOSIS — L0390 Cellulitis, unspecified: Secondary | ICD-10-CM | POA: Diagnosis present

## 2022-01-18 DIAGNOSIS — E872 Acidosis, unspecified: Secondary | ICD-10-CM | POA: Diagnosis present

## 2022-01-18 DIAGNOSIS — I5043 Acute on chronic combined systolic (congestive) and diastolic (congestive) heart failure: Secondary | ICD-10-CM | POA: Diagnosis present

## 2022-01-18 DIAGNOSIS — A419 Sepsis, unspecified organism: Secondary | ICD-10-CM | POA: Diagnosis present

## 2022-01-18 LAB — CBC WITH DIFFERENTIAL/PLATELET
Abs Immature Granulocytes: 0.08 K/uL — ABNORMAL HIGH (ref 0.00–0.07)
Basophils Absolute: 0 K/uL (ref 0.0–0.1)
Basophils Relative: 0 %
Eosinophils Absolute: 0 K/uL (ref 0.0–0.5)
Eosinophils Relative: 0 %
HCT: 29.1 % — ABNORMAL LOW (ref 39.0–52.0)
Hemoglobin: 9.3 g/dL — ABNORMAL LOW (ref 13.0–17.0)
Immature Granulocytes: 1 %
Lymphocytes Relative: 4 %
Lymphs Abs: 0.4 K/uL — ABNORMAL LOW (ref 0.7–4.0)
MCH: 23.4 pg — ABNORMAL LOW (ref 26.0–34.0)
MCHC: 32 g/dL (ref 30.0–36.0)
MCV: 73.1 fL — ABNORMAL LOW (ref 80.0–100.0)
Monocytes Absolute: 0.4 K/uL (ref 0.1–1.0)
Monocytes Relative: 4 %
Neutro Abs: 9.7 K/uL — ABNORMAL HIGH (ref 1.7–7.7)
Neutrophils Relative %: 91 %
Platelets: 173 K/uL (ref 150–400)
RBC: 3.98 MIL/uL — ABNORMAL LOW (ref 4.22–5.81)
RDW: 15 % (ref 11.5–15.5)
WBC: 10.7 K/uL — ABNORMAL HIGH (ref 4.0–10.5)
nRBC: 0 % (ref 0.0–0.2)

## 2022-01-18 LAB — LACTIC ACID, PLASMA
Lactic Acid, Venous: 2.9 mmol/L (ref 0.5–1.9)
Lactic Acid, Venous: 2.8 mmol/L (ref 0.5–1.9)
Lactic Acid, Venous: 2.1 mmol/L (ref 0.5–1.9)
Lactic Acid, Venous: 2.7 mmol/L (ref 0.5–1.9)

## 2022-01-18 LAB — PROCALCITONIN: Procalcitonin: <0.1 ng/mL

## 2022-01-18 LAB — RESPIRATORY PANEL BY PCR

## 2022-01-18 LAB — TROPONIN I (HIGH SENSITIVITY)
Troponin I (High Sensitivity): 69 ng/L — ABNORMAL HIGH (ref <18)
Troponin I (High Sensitivity): 99 ng/L — ABNORMAL HIGH (ref <18)
Troponin I (High Sensitivity): 130 ng/L (ref <18)
Troponin I (High Sensitivity): 132 ng/L

## 2022-01-18 LAB — ECHOCARDIOGRAM COMPLETE
AR max vel: 1.97 cm2
AV Area VTI: 2.1 cm2
AV Area mean vel: 2.01 cm2
AV Mean grad: 13 mmHg
AV Peak grad: 17.1 mmHg
Ao pk vel: 2.07 m/s
Area-P 1/2: 2.95 cm2
Height: 72 in
S' Lateral: 4.02 cm
Weight: 4285.74 [oz_av]

## 2022-01-18 LAB — COMPREHENSIVE METABOLIC PANEL
ALT: 32 U/L (ref 0–44)
AST: 29 U/L (ref 15–41)
Albumin: 3.2 g/dL — ABNORMAL LOW (ref 3.5–5.0)
Alkaline Phosphatase: 77 U/L (ref 38–126)
Anion gap: 13 (ref 5–15)
BUN: 25 mg/dL — ABNORMAL HIGH (ref 8–23)
CO2: 22 mmol/L (ref 22–32)
Calcium: 8.4 mg/dL — ABNORMAL LOW (ref 8.9–10.3)
Chloride: 102 mmol/L (ref 98–111)
Creatinine, Ser: 1.6 mg/dL — ABNORMAL HIGH (ref 0.61–1.24)
GFR, Estimated: 45 mL/min — ABNORMAL LOW (ref >60)
Glucose, Bld: 350 mg/dL — ABNORMAL HIGH (ref 70–99)
Potassium: 4.1 mmol/L (ref 3.5–5.1)
Sodium: 137 mmol/L (ref 135–145)
Total Bilirubin: 0.4 mg/dL (ref 0.3–1.2)
Total Protein: 5.1 g/dL — ABNORMAL LOW (ref 6.5–8.1)

## 2022-01-18 LAB — GLUCOSE, CAPILLARY
Glucose-Capillary: 324 mg/dL — ABNORMAL HIGH (ref 70–99)
Glucose-Capillary: 335 mg/dL — ABNORMAL HIGH (ref 70–99)
Glucose-Capillary: 432 mg/dL — ABNORMAL HIGH (ref 70–99)
Glucose-Capillary: 243 mg/dL — ABNORMAL HIGH (ref 70–99)
Glucose-Capillary: 172 mg/dL — ABNORMAL HIGH (ref 70–99)

## 2022-01-18 LAB — MAGNESIUM: Magnesium: 1.6 mg/dL — ABNORMAL LOW (ref 1.7–2.4)

## 2022-01-18 LAB — PHOSPHORUS: Phosphorus: 3.7 mg/dL (ref 2.5–4.6)

## 2022-01-18 LAB — STREP PNEUMONIAE URINARY ANTIGEN: Strep Pneumo Urinary Antigen: NEGATIVE

## 2022-01-18 LAB — TSH
TSH: 2.521 u[IU]/mL (ref 0.350–4.500)
TSH: 1.854 u[IU]/mL (ref 0.350–4.500)

## 2022-01-18 LAB — HEMOGLOBIN A1C
Hgb A1c MFr Bld: 9 % — ABNORMAL HIGH (ref 4.8–5.6)
Mean Plasma Glucose: 211.6 mg/dL

## 2022-01-18 MED ORDER — GUAIFENESIN ER 600 MG PO TB12
600.0000 mg | ORAL_TABLET | Freq: Two times a day (BID) | ORAL | Status: DC
  Administered 2022-01-18 – 2022-01-22 (×9): 600 mg via ORAL
  Filled 2022-01-18 (×10): qty 1

## 2022-01-18 MED ORDER — PERFLUTREN LIPID MICROSPHERE
1.0000 mL | INTRAVENOUS | Status: AC | PRN
  Administered 2022-01-18: 9 mL via INTRAVENOUS
  Filled 2022-01-18: qty 10

## 2022-01-18 MED ORDER — FUROSEMIDE 10 MG/ML IJ SOLN
40.0000 mg | Freq: Every day | INTRAMUSCULAR | Status: DC
  Administered 2022-01-18 – 2022-01-21 (×4): 40 mg via INTRAVENOUS
  Filled 2022-01-18 (×5): qty 4

## 2022-01-18 MED ORDER — SODIUM CHLORIDE 0.9 % IV SOLN: 2.0000 g | INTRAVENOUS | Status: DC

## 2022-01-18 MED ORDER — POLYETHYLENE GLYCOL 3350 17 G PO PACK: 17.0000 g | PACK | Freq: Every day | ORAL | Status: DC | PRN

## 2022-01-18 MED ORDER — SODIUM CHLORIDE 0.9 % IV SOLN
100.0000 mg | Freq: Two times a day (BID) | INTRAVENOUS | Status: DC
  Administered 2022-01-18 – 2022-01-22 (×9): 100 mg via INTRAVENOUS
  Filled 2022-01-18 (×11): qty 100

## 2022-01-18 MED ORDER — ISOSORBIDE MONONITRATE ER 30 MG PO TB24
30.0000 mg | ORAL_TABLET | Freq: Every day | ORAL | Status: DC
  Administered 2022-01-18 – 2022-01-22 (×5): 30 mg via ORAL
  Filled 2022-01-18 (×5): qty 1

## 2022-01-18 MED ORDER — AMIODARONE HCL 200 MG PO TABS
200.0000 mg | ORAL_TABLET | Freq: Every day | ORAL | Status: DC
  Administered 2022-01-18 – 2022-01-22 (×5): 200 mg via ORAL
  Filled 2022-01-18 (×5): qty 1

## 2022-01-18 MED ORDER — MAGNESIUM SULFATE 2 GM/50ML IV SOLN
2.0000 g | Freq: Once | INTRAVENOUS | Status: AC
Start: 2022-01-18 — End: 2022-01-18
  Administered 2022-01-18: 2 g via INTRAVENOUS
  Filled 2022-01-18: qty 50

## 2022-01-18 MED ORDER — SODIUM CHLORIDE 0.9 % IV SOLN
1.0000 g | Freq: Once | INTRAVENOUS | Status: AC
  Administered 2022-01-18: 1 g via INTRAVENOUS
  Filled 2022-01-18: qty 10

## 2022-01-18 MED ORDER — SODIUM CHLORIDE 0.9% FLUSH
3.0000 mL | Freq: Two times a day (BID) | INTRAVENOUS | Status: DC
  Administered 2022-01-18 – 2022-01-22 (×7): 3 mL via INTRAVENOUS

## 2022-01-18 MED ORDER — FINASTERIDE 5 MG PO TABS
5.0000 mg | ORAL_TABLET | Freq: Every day | ORAL | Status: DC
  Administered 2022-01-18 – 2022-01-21 (×5): 5 mg via ORAL
  Filled 2022-01-18 (×5): qty 1

## 2022-01-18 MED ORDER — FUROSEMIDE 10 MG/ML IJ SOLN: 40.0000 mg | Freq: Two times a day (BID) | INTRAMUSCULAR | Status: DC

## 2022-01-18 MED ORDER — METOPROLOL TARTRATE 50 MG PO TABS
50.0000 mg | ORAL_TABLET | Freq: Two times a day (BID) | ORAL | Status: DC
  Administered 2022-01-18 – 2022-01-22 (×9): 50 mg via ORAL
  Filled 2022-01-18 (×9): qty 1

## 2022-01-18 MED ORDER — TERAZOSIN HCL 1 MG PO CAPS
2.0000 mg | ORAL_CAPSULE | Freq: Every day | ORAL | Status: DC
  Administered 2022-01-19 – 2022-01-21 (×3): 2 mg via ORAL
  Filled 2022-01-18 (×4): qty 2

## 2022-01-18 MED ORDER — SODIUM CHLORIDE 0.9% FLUSH: 3.0000 mL | INTRAVENOUS | Status: DC | PRN

## 2022-01-18 MED ORDER — INSULIN GLARGINE-YFGN 100 UNIT/ML ~~LOC~~ SOLN
30.0000 [IU] | Freq: Every day | SUBCUTANEOUS | Status: DC
Start: 2022-01-18 — End: 2022-01-22
  Administered 2022-01-18 – 2022-01-21 (×4): 30 [IU] via SUBCUTANEOUS
  Filled 2022-01-18 (×5): qty 0.3

## 2022-01-18 MED ORDER — INSULIN GLARGINE-YFGN 100 UNIT/ML ~~LOC~~ SOLN
25.0000 [IU] | Freq: Every day | SUBCUTANEOUS | Status: DC
Start: 2022-01-18 — End: 2022-01-18
  Filled 2022-01-18: qty 0.25

## 2022-01-18 MED ORDER — HYDROCODONE-ACETAMINOPHEN 5-325 MG PO TABS: 1.0000 | ORAL_TABLET | ORAL | Status: DC | PRN

## 2022-01-18 MED ORDER — APIXABAN 5 MG PO TABS
5.0000 mg | ORAL_TABLET | Freq: Two times a day (BID) | ORAL | Status: DC
  Administered 2022-01-18 – 2022-01-22 (×10): 5 mg via ORAL
  Filled 2022-01-18 (×10): qty 1

## 2022-01-18 MED ORDER — INSULIN ASPART 100 UNIT/ML IJ SOLN
3.0000 [IU] | Freq: Three times a day (TID) | INTRAMUSCULAR | Status: DC
  Administered 2022-01-18 – 2022-01-22 (×13): 3 [IU] via SUBCUTANEOUS

## 2022-01-18 MED ORDER — INSULIN GLARGINE-YFGN 100 UNIT/ML ~~LOC~~ SOLN
30.0000 [IU] | Freq: Every day | SUBCUTANEOUS | Status: DC
  Filled 2022-01-18: qty 0.3

## 2022-01-18 MED ORDER — CLOPIDOGREL BISULFATE 75 MG PO TABS
75.0000 mg | ORAL_TABLET | Freq: Every day | ORAL | Status: DC
  Administered 2022-01-18 – 2022-01-22 (×5): 75 mg via ORAL
  Filled 2022-01-18 (×5): qty 1

## 2022-01-18 MED ORDER — SODIUM CHLORIDE 0.9 % IV SOLN
2.0000 g | INTRAVENOUS | Status: DC
  Administered 2022-01-18 – 2022-01-21 (×4): 2 g via INTRAVENOUS
  Filled 2022-01-18 (×5): qty 20

## 2022-01-18 MED ORDER — TAMSULOSIN HCL 0.4 MG PO CAPS
0.4000 mg | ORAL_CAPSULE | Freq: Every day | ORAL | Status: DC
Start: 2022-01-18 — End: 2022-01-22
  Administered 2022-01-18 – 2022-01-21 (×4): 0.4 mg via ORAL
  Filled 2022-01-18 (×4): qty 1

## 2022-01-18 MED ORDER — SODIUM CHLORIDE 0.9 % IV SOLN
250.0000 mL | INTRAVENOUS | Status: DC | PRN
Start: 2022-01-18 — End: 2022-01-22

## 2022-01-18 MED ORDER — ATORVASTATIN CALCIUM 10 MG PO TABS
20.0000 mg | ORAL_TABLET | Freq: Every day | ORAL | Status: DC
  Administered 2022-01-18 – 2022-01-21 (×4): 20 mg via ORAL
  Filled 2022-01-18 (×5): qty 2

## 2022-01-18 MED ORDER — INSULIN ASPART 100 UNIT/ML IJ SOLN
0.0000 [IU] | Freq: Three times a day (TID) | INTRAMUSCULAR | Status: DC
  Administered 2022-01-18: 15 [IU] via SUBCUTANEOUS
  Administered 2022-01-18: 5 [IU] via SUBCUTANEOUS
  Administered 2022-01-19 – 2022-01-20 (×5): 3 [IU] via SUBCUTANEOUS
  Administered 2022-01-20: 5 [IU] via SUBCUTANEOUS
  Administered 2022-01-21: 3 [IU] via SUBCUTANEOUS
  Administered 2022-01-21 – 2022-01-22 (×4): 5 [IU] via SUBCUTANEOUS

## 2022-01-18 MED ORDER — ACETAMINOPHEN 325 MG PO TABS: 650.0000 mg | ORAL_TABLET | Freq: Four times a day (QID) | ORAL | Status: DC | PRN

## 2022-01-18 MED ORDER — PANTOPRAZOLE SODIUM 40 MG PO TBEC
40.0000 mg | DELAYED_RELEASE_TABLET | Freq: Two times a day (BID) | ORAL | Status: DC
  Administered 2022-01-18 – 2022-01-22 (×8): 40 mg via ORAL
  Filled 2022-01-18 (×8): qty 1

## 2022-01-18 MED ORDER — ONDANSETRON HCL 4 MG/2ML IJ SOLN
4.0000 mg | Freq: Four times a day (QID) | INTRAMUSCULAR | Status: DC | PRN
  Administered 2022-01-18: 4 mg via INTRAVENOUS
  Filled 2022-01-18: qty 2

## 2022-01-18 MED ORDER — ALUM & MAG HYDROXIDE-SIMETH 200-200-20 MG/5ML PO SUSP
15.0000 mL | ORAL | Status: DC | PRN
  Administered 2022-01-18: 15 mL via ORAL
  Filled 2022-01-18: qty 30

## 2022-01-18 MED ORDER — ACETAMINOPHEN 650 MG RE SUPP: 650.0000 mg | Freq: Four times a day (QID) | RECTAL | Status: DC | PRN

## 2022-01-18 MED ORDER — INSULIN ASPART 100 UNIT/ML IJ SOLN
0.0000 [IU] | Freq: Every day | INTRAMUSCULAR | Status: DC
  Administered 2022-01-21: 4 [IU] via SUBCUTANEOUS

## 2022-01-18 MED ORDER — IPRATROPIUM-ALBUTEROL 0.5-2.5 (3) MG/3ML IN SOLN
3.0000 mL | Freq: Four times a day (QID) | RESPIRATORY_TRACT | Status: DC
  Administered 2022-01-18 – 2022-01-19 (×4): 3 mL via RESPIRATORY_TRACT
  Filled 2022-01-18 (×5): qty 3

## 2022-01-18 MED ORDER — INSULIN ASPART 100 UNIT/ML IJ SOLN: 5.0000 [IU] | Freq: Three times a day (TID) | INTRAMUSCULAR | Status: DC

## 2022-01-18 NOTE — Progress Notes (Signed)
PROGRESS NOTE  [name] MVH:[id number] DOB: 1946/08/04 DOA: 01/17/2022 PCP: Verl Bangs, MD   LOS: 1 day   Brief Narrative / Interim history: This is a 76 yo M with history of DM2, chronic kidney disease, diffuse non-Hodgkin's lymphoma, hypertension, OSA, coronary artery disease with history of PCI with DES to RCA January 2022 on Plavix, PAF on Eliquis and amiodarone, NSVT, who comes into the hospital with severe shortness of breath and worsening of his leg swelling.  Wife also felt that he had a fever.  He required BiPAP on admission.  Of note, he was admitted last July with concerns for endocarditis and treated with 6 weeks of antibiotics via PICC line.  Chest x-ray this admission showed bilateral lower lobe airspace disease worrisome for multifocal pneumonia or edema, and stable cardiomegaly.  He was given Lasix, placed on IV antibiotics and admitted to the hospital.  Subjective / 24h Interval events: Seems to be doing better this morning.  Wife and son are at bedside.  Assesement and Plan: Principal Problem:   CAP (community acquired pneumonia) Active Problems:   DM2 (diabetes mellitus, type 2) (HCC)   HTN (hypertension)   HLD (hyperlipidemia)   Paroxysmal A-fib (HCC)   Diffuse non-Hodgkin's lymphoma (HCC)   Hypomagnesemia   Cellulitis   Sepsis (HCC)   Acute respiratory failure with hypoxia (HCC)   Acute on chronic combined systolic and diastolic CHF (congestive heart failure) (HCC)   Lactic acidosis   Assessment and Plan: Principal problem Acute hypoxic respiratory failure, likely multifactorial in the setting of acute on chronic combined CHF, possible community-acquired pneumonia-continue supportive care with supplemental oxygen to maintain sats above 90%.  Continue Lasix as well as antibiotics while monitoring cultures and clinical course.  Active problems Acute on chronic combined CHF-a 2D echo has been ordered and is pending.  Cardiology was consulted as  well and consult is pending.  Most recent EF was about 50%, as low as 45% in the past.  Continue IV Lasix this morning, closely monitor ins and outs, renal function.  Placed TED hoses  Troponin elevation-still climbing but overall flat, likely demand ischemia.  Cardiology to see as well.  Acute hypoxic respiratory failure-supplemental O2 to maintain sats above 90%  CKD 3B-most recent creatinine as an outpatient past February was 1.7.  Currently at 1.6, within his baseline, continue IV Lasix and closely monitor.  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  Most recent evaluation December 2020 with excellent response to treatment  Paroxysmal A-fib-continue amiodarone, Eliquis  Coronary artery disease-with history of stenting, continue Plavix  Hyperlipidemia-continue statin  BPH-continue Flomax  Anemia of chronic disease-hemoglobin stable at 9.3, no bleeding  Type 2 diabetes mellitus, with hyperglycemia-increase long-acting, add mealtime insulin given elevated CBGs this morning.  Hemoglobin A1c 9.0  CBG (last 3)  Recent Labs    01/18/22 0143 01/18/22 0552  GLUCAP 324* 335*   History of complicated UTI with sepsis, Pseudomonas bacteremia, possible endocarditis-June 2022.  Stable, monitor cultures currently  Scheduled Meds:  amiodarone  200 mg Oral Daily   apixaban  5 mg Oral BID   atorvastatin  20 mg Oral QHS   clopidogrel  75 mg Oral Daily   finasteride  5 mg Oral QHS   guaiFENesin  600 mg Oral BID   insulin aspart  0-9 Units Subcutaneous Q4H   insulin glargine-yfgn  25 Units Subcutaneous Q2200   ipratropium-albuterol  3 mL Nebulization Q6H   isosorbide mononitrate  30 mg Oral Daily  metoprolol tartrate  50 mg Oral BID   sodium chloride flush  3 mL Intravenous Q12H   tamsulosin  0.4 mg Oral QPC supper   [START ON 01/19/2022] terazosin  2 mg Oral QHS   Continuous Infusions:  sodium chloride     cefTRIAXone (ROCEPHIN)  IV     doxycycline  (VIBRAMYCIN) IV 100 mg (01/18/22 0616)   magnesium sulfate bolus IVPB 2 g (01/18/22 0926)   PRN Meds:.sodium chloride, acetaminophen **OR** acetaminophen, HYDROcodone-acetaminophen, polyethylene glycol, sodium chloride flush  Diet Orders (From admission, onward)     Start     Ordered   01/18/22 0114  Diet Carb Modified Fluid consistency: Thin; Room service appropriate? Yes  Diet effective now       Question Answer Comment  Diet-HS Snack? Nothing   Calorie Level Medium 1600-2000   Fluid consistency: Thin   Room service appropriate? Yes      01/18/22 0113            DVT prophylaxis:  apixaban (ELIQUIS) tablet 5 mg   Lab Results  Component Value Date   PLT 173 01/18/2022      Code Status: Full Code  Family Communication: wife and son at bedside   Status is: Inpatient  Remains inpatient appropriate because: severity of illness  Level of care: Progressive  Consultants:  Cardiology   Procedures:  2D echo: pending  Microbiology  Blood cultures-pending  Antimicrobials: Ceftriaxone / Azithromycin 3/5 >>    Objective: Vitals:   01/18/22 0139 01/18/22 0336 01/18/22 0500 01/18/22 0522  BP:    122/62  Pulse:  79  77  Resp:    (!) 26  Temp:    98.3 F (36.8 C)  TempSrc:    Axillary  SpO2: 98% 98%  97%  Weight:   121.5 kg     Intake/Output Summary (Last 24 hours) at 01/18/2022 0949 Last data filed at 01/18/2022 0300 Gross per 24 hour  Intake 393.75 ml  Output --  Net 393.75 ml   Wt Readings from Last 3 Encounters:  01/18/22 121.5 kg    Examination:  Constitutional: NAD Eyes: no scleral icterus ENMT: Mucous membranes are moist.  Neck: normal, supple Respiratory: Faint bibasilar crackles, no wheezing.  Tachypneic.  Moves air well Cardiovascular: Regular rate and rhythm, no murmurs / rubs / gallops.  2+ LE edema.  Abdomen: non distended, no tenderness. Bowel sounds positive.  Musculoskeletal: no clubbing / cyanosis.  Skin: no rashes Neurologic: CN  2-12 grossly intact. Strength 5/5 in all 4.  Psychiatric: Normal judgment and insight. Alert and oriented x 3. Normal mood.    Data Reviewed: I have independently reviewed following labs and imaging studies   CBC Recent Labs  Lab 01/17/22 2154 01/17/22 2202 01/17/22 2217 01/18/22 0204  WBC 14.4*  --   --  10.7*  HGB 10.8* 11.2* 10.5* 9.3*  HCT 34.6* 33.0* 31.0* 29.1*  PLT 259  --   --  173  MCV 74.4*  --   --  73.1*  MCH 23.2*  --   --  23.4*  MCHC 31.2  --   --  32.0  RDW 15.1  --   --  15.0  LYMPHSABS 2.5  --   --  0.4*  MONOABS 1.2*  --   --  0.4  EOSABS 0.1  --   --  0.0  BASOSABS 0.1  --   --  0.0    Recent Labs  Lab 01/17/22 2154 01/17/22 2202 01/17/22 2214  01/17/22 2217 01/17/22 2330 01/18/22 0014 01/18/22 0021 01/18/22 0204 01/18/22 0445  NA 137 137  --  137  --   --   --  137  --   K 4.8 4.7  --  4.4  --   --   --  4.1  --   CL 103 105  --   --   --   --   --  102  --   CO2 19*  --   --   --   --   --   --  22  --   GLUCOSE 308* 302*  --   --   --   --   --  350*  --   BUN 23 26*  --   --   --   --   --  25*  --   CREATININE 1.45* 1.30*  --   --   --   --   --  1.60*  --   CALCIUM 8.8*  --   --   --   --   --   --  8.4*  --   AST 36  --   --   --   --   --   --  29  --   ALT 36  --   --   --   --   --   --  32  --   ALKPHOS 97  --   --   --   --   --   --  77  --   BILITOT 0.5  --   --   --   --   --   --  0.4  --   ALBUMIN 3.6  --   --   --   --   --   --  3.2*  --   MG 1.3*  --   --   --   --   --   --  1.6*  --   PROCALCITON <0.10  --   --   --   --   --   --   --   --   LATICACIDVEN  --   --  2.7*  --   --  2.9*  --  2.8* 2.1*  TSH  --   --   --   --  2.521  --   --  1.854  --   HGBA1C  --   --   --   --   --   --  9.0*  --   --   BNP 220.4*  --   --   --   --   --   --   --   --     ------------------------------------------------------------------------------------------------------------------ No results for input(s): CHOL, HDL, LDLCALC,  TRIG, CHOLHDL, LDLDIRECT in the last 72 hours.  Lab Results  Component Value Date   HGBA1C 9.0 (H) 01/18/2022   ------------------------------------------------------------------------------------------------------------------ Recent Labs    01/18/22 0204  TSH 1.854    Cardiac Enzymes No results for input(s): CKMB, TROPONINI, MYOGLOBIN in the last 168 hours.  Invalid input(s): CK ------------------------------------------------------------------------------------------------------------------    Component Value Date/Time   BNP 220.4 (H) 01/17/2022 2154    CBG: Recent Labs  Lab 01/18/22 0143 01/18/22 0552  GLUCAP 324* 335*    Recent Results (from the past 240 hour(s))  Resp Panel by RT-PCR (Flu A&B, Covid) Nasopharyngeal Swab     Status: None   Collection Time:  01/17/22 10:02 PM   Specimen: Nasopharyngeal Swab; Nasopharyngeal(NP) swabs in vial transport medium  Result Value Ref Range Status   SARS Coronavirus 2 by RT PCR NEGATIVE NEGATIVE Final    Comment: (NOTE) SARS-CoV-2 target nucleic acids are NOT DETECTED.  The SARS-CoV-2 RNA is generally detectable in upper respiratory specimens during the acute phase of infection. The lowest concentration of SARS-CoV-2 viral copies this assay can detect is 138 copies/mL. A negative result does not preclude SARS-Cov-2 infection and should not be used as the sole basis for treatment or other patient management decisions. A negative result may occur with  improper specimen collection/handling, submission of specimen other than nasopharyngeal swab, presence of viral mutation(s) within the areas targeted by this assay, and inadequate number of viral copies(<138 copies/mL). A negative result must be combined with clinical observations, patient history, and epidemiological information. The expected result is Negative.  Fact Sheet for Patients:  BloggerCourse.com  Fact Sheet for Healthcare Providers:   SeriousBroker.it  This test is no t yet approved or cleared by the [place] FDA and  has been authorized for detection and/or diagnosis of SARS-CoV-2 by FDA under an Emergency Use Authorization (EUA). This EUA will remain  in effect (meaning this test can be used) for the duration of the COVID-19 declaration under Section 564(b)(1) of the Act, 21 U.S.C.section 360bbb-3(b)(1), unless the authorization is terminated  or revoked sooner.       Influenza A by PCR NEGATIVE NEGATIVE Final   Influenza B by PCR NEGATIVE NEGATIVE Final    Comment: (NOTE) The Xpert Xpress SARS-CoV-2/FLU/RSV plus assay is intended as an aid in the diagnosis of influenza from Nasopharyngeal swab specimens and should not be used as a sole basis for treatment. Nasal washings and aspirates are unacceptable for Xpert Xpress SARS-CoV-2/FLU/RSV testing.  Fact Sheet for Patients: BloggerCourse.com  Fact Sheet for Healthcare Providers: SeriousBroker.it  This test is not yet approved or cleared by the [place] FDA and has been authorized for detection and/or diagnosis of SARS-CoV-2 by FDA under an Emergency Use Authorization (EUA). This EUA will remain in effect (meaning this test can be used) for the duration of the COVID-19 declaration under Section 564(b)(1) of the Act, 21 U.S.C. section 360bbb-3(b)(1), unless the authorization is terminated or revoked.  Performed at [facility], [address] [place], [place] [address]   Blood culture (routine x 2)     Status: None (Preliminary result)   Collection Time: 01/17/22 10:39 PM   Specimen: BLOOD RIGHT FOREARM  Result Value Ref Range Status   Specimen Description BLOOD RIGHT FOREARM  Final   Special Requests   Final    BOTTLES DRAWN AEROBIC AND ANAEROBIC Blood Culture results may not be optimal due to an inadequate volume of blood received in culture bottles   Culture    Final    NO GROWTH < 12 HOURS Performed at [facility], [address] [place], [place] [address]    Report Status PENDING  Incomplete  Blood culture (routine x 2)     Status: None (Preliminary result)   Collection Time: 01/17/22 10:41 PM   Specimen: BLOOD RIGHT HAND  Result Value Ref Range Status   Specimen Description BLOOD RIGHT HAND  Final   Special Requests   Final    BOTTLES DRAWN AEROBIC AND ANAEROBIC Blood Culture results may not be optimal due to an inadequate volume of blood received in culture bottles   Culture   Final  NO GROWTH < 12 HOURS Performed at [facility], [address], [place], [place] [address]    Report Status PENDING  Incomplete     Radiology Studies: DG Chest Port 1 View  Result Date: 01/17/2022 CLINICAL DATA:  Shortness of breath. EXAM: PORTABLE CHEST 1 VIEW COMPARISON:  Chest x-ray 05/23/2021. FINDINGS: The heart is enlarged. There are patchy airspace opacities in both lung bases. No large pleural effusion or pneumothorax. No acute fractures. IMPRESSION: 1. Bilateral lower lobe airspace disease worrisome for multifocal pneumonia and/or edema. 2. Stable cardiomegaly. Electronically Signed   By: Darliss Cheney M.D.   On: 01/17/2022 22:15     Pamella Pert, MD, PhD [facility]  Between 7 am - 7 pm I am available, please contact me via Amion (for emergencies) or Securechat (non urgent messages)  Between 7 pm - 7 am I am not available, please contact night coverage MD/APP via Amion

## 2022-01-18 NOTE — Assessment & Plan Note (Signed)
Possibly due to increased work of breathing ?Pt doing a bit better after lasix would hold off on aggressive fluids given pt appears fluid up ?With stable bp and improving ?

## 2022-01-18 NOTE — ED Notes (Signed)
Pt transitioned to 4L [place], O2 96%, pt speaking in full sentences ?

## 2022-01-18 NOTE — Progress Notes (Signed)
Echocardiogram ?2D Echocardiogram has been performed. ? ?[name] ?01/18/2022, 2:09 PM ?

## 2022-01-18 NOTE — Consult Note (Signed)
Cardiology Consultation:   Patient ID: [name] MRN: [id number]; DOB: [birth year]  Admit date: 01/17/2022 Date of Consult: 01/18/2022  PCP:  Stanley Geralds, MD   [facility] Providers Cardiologist: [name] >> like to establish care with [facility]  Patient Profile:   [name] is a 76 y.o. male with a hx of CAD s/p DES to distal RCA January 2022, paroxysmal atrial fibrillation (on Eliquis and amiodarone), NSVT, ascending aortic aneurysm, non-Hodgkin's lymphoma, hypertension, diabetes mellitus, chronic kidney disease, obstructive sleep apnea on CPAP and possible endocarditis who is being seen 01/18/2022 for the evaluation of CHF at the request of [name].  "Cardiac cath from - Oct 17, 2020 revealed severe coronary artery disease.  Prox Cx lesion is 70% stenosed.  1st Mrg lesion is 70% stenosed.  Mid LAD lesion is 70% stenosed.  1st Diag lesion is 99% stenosed.  Dist RCA lesion is 80% stenosed.  Prox RCA to Mid RCA lesion is 10% stenosed.  [name] planned another cardiac cath for January 7 to do FFR on proximal LAD and if not significant perform PCI of RCA. If FFR will review severe proximal LAD disease, patient would have multivessel cardiac bypass done of LAD, left circumflex and RCA.  Pt had cardiac cath on Nov 21, 2020 and FFR did not reveal severe stenosis of LAD with FFR 0.86-0.92 and by visual estimation 30 to 40% luminal narrowing. Patient had PCI of distal RCA with DES".  Admitted 05/2021 for sepsis secondary to complicated UTI and Pseudomonas bacteremia. TEE w/o clear evidence of a vegetation but abnormality noted on mitral valve w/ no prior TTE for comparison - favor holding off on port placement until antibiotic course completed - PICC line placed 7/16 to facilitate outpt abx (w/ blessing of ID) - ID has suggested tx empirically for endocarditis given TEE findings - home abx infusion arranged and to be followed in ID clinic.   History of  Present Illness:   [name] reported chronic lower extremity edema since being treated with steroid as a part of chemotherapy.  Now his steroid has been discontinued.  Also has ulcer.   Patient had sudden onset shortness of breath with cough and subjective fever.  EMS was called and found hypoxic at 81% on room air.  He was given IV Solu-Medrol and albuterol.  Placed on 5 L nasal cannula with improvement in oxygenation.  Chest x-ray with multifocal pneumonia or edema.  He was admitted for community-acquired pneumonia started on broad-spectrum antibiotic.  Given IV Lasix 40 mg daily.  Net INO +400 cc. Weight 267lb. Pending LE doppler and echocardiogram.   BNP 220 Hs-troponin 28>>69>>99>>130>>132 Scr 1.45>>1.3>>1.6 Mg 1.6 HgbA1c 9.0 TSH normal  No past medical history on file. As summarized above.  Reviewed another epic chart.   Family history of premature CAD.  Patient's sister had CABG done in her 49s.  Father had multiple episodes of syncope and died from some type of heart disease as per patient at age 30.  Inpatient Medications: Scheduled Meds:  amiodarone  200 mg Oral Daily   apixaban  5 mg Oral BID   atorvastatin  20 mg Oral QHS   clopidogrel  75 mg Oral Daily   finasteride  5 mg Oral QHS   furosemide  40 mg Intravenous Daily   guaiFENesin  600 mg Oral BID   insulin aspart  0-15 Units Subcutaneous TID WC   insulin aspart  0-5 Units Subcutaneous QHS   insulin aspart  3  Units Subcutaneous TID WC   insulin glargine-yfgn  30 Units Subcutaneous Q2200   ipratropium-albuterol  3 mL Nebulization Q6H   isosorbide mononitrate  30 mg Oral Daily   metoprolol tartrate  50 mg Oral BID   sodium chloride flush  3 mL Intravenous Q12H   tamsulosin  0.4 mg Oral QPC supper   [START ON 01/19/2022] terazosin  2 mg Oral QHS   Continuous Infusions:  sodium chloride     cefTRIAXone (ROCEPHIN)  IV     doxycycline (VIBRAMYCIN) IV 100 mg (01/18/22 0616)   PRN Meds: sodium chloride,  acetaminophen **OR** acetaminophen, HYDROcodone-acetaminophen, polyethylene glycol, sodium chloride flush  Allergies:   No Known Allergies  Social History:   Social History   Socioeconomic History   Marital status: Married    Spouse name: Not on file   Number of children: Not on file   Years of education: Not on file   Highest education level: Not on file  Occupational History   Not on file  Tobacco Use   Smoking status: Not on file   Smokeless tobacco: Not on file  Substance and Sexual Activity   Alcohol use: Not on file   Drug use: Not on file   Sexual activity: Not on file  Other Topics Concern   Not on file  Social History Narrative   Not on file   Social Determinants of Health   Financial Resource Strain: Not on file  Food Insecurity: Not on file  Transportation Needs: Not on file  Physical Activity: Not on file  Stress: Not on file  Social Connections: Not on file  Intimate Partner Violence: Not on file    Family History:   No family history on file.  As above  ROS:  Please see the history of present illness.  All other ROS reviewed and negative.     Physical Exam/Data:   Vitals:   01/18/22 0522 01/18/22 0752 01/18/22 1000 01/18/22 1152  BP: 122/62 138/60  104/62  Pulse: 77 75  78  Resp: (!) '26 20  20  ' Temp: 98.3 F (36.8 C)     TempSrc: Axillary     SpO2: 97% 94%  96%  Weight:      Height:   6' (1.829 m)     Intake/Output Summary (Last 24 hours) at 01/18/2022 1310 Last data filed at 01/18/2022 0300 Gross per 24 hour  Intake 393.75 ml  Output --  Net 393.75 ml   Last 3 Weights 01/18/2022  Weight (lbs) 267 lb 13.7 oz  Weight (kg) 121.5 kg     Body mass index is 36.33 kg/m.  General:  obese male in no acute distress HEENT: normal Neck: ? + JVD Vascular: No carotid bruits; Distal pulses 2+ bilaterally Cardiac:  normal S1, S2; RRR; no murmur  Lungs:  clear to auscultation bilaterally, no wheezing, rhonchi or rales  Abd: soft, nontender, no  hepatomegaly  Ext:  LE edema R > L. Would on RLE Musculoskeletal:  No deformities, BUE and BLE strength normal and equal Skin: warm and dry  Neuro:  CNs 2-12 intact, no focal abnormalities noted Psych:  Normal affect   EKG:  The EKG was personally reviewed and demonstrates: Sinus/atrial ectopic rhythm, LAFB Telemetry:  Telemetry was personally reviewed and demonstrates:  sinus rhythm  Relevant CV Studies:  Echo 05/2021  1. Left ventricular ejection fraction, by estimation, is 50 to 55%. The  left ventricle has low normal function. The left ventricle has no regional  wall motion abnormalities. There is mild left ventricular hypertrophy.  Left ventricular diastolic  parameters are consistent with Grade I diastolic dysfunction (impaired  relaxation). The average left ventricular global longitudinal strain is  -16.5 %. The global longitudinal strain is normal.   2. Right ventricular systolic function is normal. The right ventricular  size is normal. There is normal pulmonary artery systolic pressure.   3. Left atrial size was mildly dilated.   4. The mitral valve is normal in structure. No evidence of mitral valve  regurgitation. No evidence of mitral stenosis.   5. The aortic valve is calcified. There is moderate calcification of the  aortic valve. There is mild thickening of the aortic valve. Aortic valve  regurgitation is not visualized. Mild aortic valve sclerosis is present,  with no evidence of aortic valve  stenosis. Aortic valve mean gradient measures 7.0 mmHg. Aortic valve Vmax  measures 1.85 m/s.   6. The inferior vena cava is normal in size with greater than 50%  respiratory variability, suggesting right atrial pressure of 3 mmHg.   TEE 05/2021  1. The mitral valve is degenerative. There is a <1 cm echodensity on the  atrial surface on the mitral valve without associated regurgitation.  Echodensity is most consistent with valvular calcification, though  comparison imaging  not available. Trivial  mitral valve regurgitation. No evidence of mitral stenosis.   2. Aortic dilatation noted. There is moderate dilatation of the ascending  aorta, measuring 47 mm. There is Moderate (Grade III) plaque involving the  transverse aorta.   3. Left ventricular ejection fraction, by estimation, is 45 to 50%. The  left ventricle has mildly decreased function.   4. Right ventricular systolic function is normal. The right ventricular  size is normal.   5. No left atrial/left atrial appendage thrombus was detected. The LAA  emptying velocity was 41 cm/s.   6. The aortic valve is tricuspid. There is mild calcification of the  aortic valve. There is mild thickening of the aortic valve. Aortic valve  regurgitation is not visualized. Mild aortic valve sclerosis is present,  with no evidence of aortic valve  stenosis.   Conclusion(s)/Recommendation(s): Consider follow-up imaging for ascending  aortic size in 6 months (CTA or MRA).   Cath 11/21/2020 PCI description:  PCI of the right coronary artery was performed.  The right coronary artery  was engaged with a 6 [place] Amplatz 1 guide catheter.  A BMW wire was  advanced through the guide and across the distal lesion.  The wire was  advanced into the distal posterior lateral branch.  Angioplasty was  performed across the distal RCA lesion with a 2.5 x 12 mm balloon to rated  pressure.  A 3.0 x 50 mm Onyx drug-eluting stent was deployed to 15 atm  for 30 seconds.  Angiogram showed excellent result with no residual  stenosis.  The sheath and guide were removed.  A Mynx device was deployed  with good hemostasis.   Sedation: Intravenous midazolam and fentanyl were administered for  sedation under supervision of the attending physician.  The patient's  vital signs were continuously monitored during the case by trained Cath  Lab personnel.  The patient's vital signs remained stable and no immediate  complications were noted.    Summary:  IFR of the left anterior descending artery revealed inconsistent results,  ranging from 0.86-0.92.  Angiographically, the LAD stenosis when imaged  using a 6 [place] guide catheter appeared to be much less significant.  The  lesion was estimated to have approximately 30 to 40% luminal narrowing.   The RCA stenosis was treated with PCI with implantation of a 3.0 x 15 mm  drug-eluting stent.   Plan:  The patient will be admitted to the cardiac unit overnight.  His admission  is warranted due increased risk of bleeding complications, or language  barriers complicating care, and uncontrolled atrial fibrillation.  He will  require transitioning from ticagrelor to clopidogrel after being given a  ticagrelor loading dose during the procedure.  This is being pursued due  to the fact that the patient requires oral anticoagulation therapy for  stroke prophylaxis for atrial fibrillation.  These together place the  patient at increased risk of bleeding complications, and he would  therefore benefit from close monitoring.  Also, his atrial fibrillation is  poorly controlled, and improved rate control will be pursued.  Laboratory Data:  High Sensitivity Troponin:   Recent Labs  Lab 01/17/22 2154 01/17/22 2349 01/18/22 0204 01/18/22 0445 01/18/22 0658  TROPONINIHS 28* 69* 99* 130* 132*     Chemistry Recent Labs  Lab 01/17/22 2154 01/17/22 2202 01/17/22 2217 01/18/22 0204  NA 137 137 137 137  K 4.8 4.7 4.4 4.1  CL 103 105  --  102  CO2 19*  --   --  22  GLUCOSE 308* 302*  --  350*  BUN 23 26*  --  25*  CREATININE 1.45* 1.30*  --  1.60*  CALCIUM 8.8*  --   --  8.4*  MG 1.3*  --   --  1.6*  GFRNONAA 51*  --   --  45*  ANIONGAP 15  --   --  13    Recent Labs  Lab 01/17/22 2154 01/18/22 0204  PROT 5.5* 5.1*  ALBUMIN 3.6 3.2*  AST 36 29  ALT 36 32  ALKPHOS 97 77  BILITOT 0.5 0.4   Lipids No results for input(s): CHOL, TRIG, HDL, LABVLDL, LDLCALC, CHOLHDL in the  last 168 hours.  Hematology Recent Labs  Lab 01/17/22 2154 01/17/22 2202 01/17/22 2217 01/18/22 0204  WBC 14.4*  --   --  10.7*  RBC 4.65  --   --  3.98*  HGB 10.8* 11.2* 10.5* 9.3*  HCT 34.6* 33.0* 31.0* 29.1*  MCV 74.4*  --   --  73.1*  MCH 23.2*  --   --  23.4*  MCHC 31.2  --   --  32.0  RDW 15.1  --   --  15.0  PLT 259  --   --  173   Thyroid  Recent Labs  Lab 01/18/22 0204  TSH 1.854    BNP Recent Labs  Lab 01/17/22 2154  BNP 220.4*    DDimer No results for input(s): DDIMER in the last 168 hours.   Radiology/Studies:  DG Chest Port 1 View  Result Date: 01/17/2022 CLINICAL DATA:  Shortness of breath. EXAM: PORTABLE CHEST 1 VIEW COMPARISON:  Chest x-ray 05/23/2021. FINDINGS: The heart is enlarged. There are patchy airspace opacities in both lung bases. No large pleural effusion or pneumothorax. No acute fractures. IMPRESSION: 1. Bilateral lower lobe airspace disease worrisome for multifocal pneumonia and/or edema. 2. Stable cardiomegaly. Electronically Signed   By: Ronney Asters M.D.   On: 01/17/2022 22:15     Assessment and Plan:   Acute hypoxic respiratory failure secondary to possible CAP and CHF exacerbation -Antibiotic treatment per primary team -Continue IV Lasix  2.  Acute on chronic diastolic heart failure -  Last echocardiogram July 2022 with LV function of 50 to 55%.  Patient reported chronic lower extremity edema since being treated with steroid (now DC) for chemotherapy. -Evidence of volume overload on exam. -Agree with IV Lasix 40 mg daily -Continue metoprolol tartrate 50 mg twice daily -Continue Imdur -Some of his lower extremity edema could be due to his use of amlodipine (currently on hold)  3.  Elevated troponin with history of CAD s/p DES to RCA -He does have residual moderate disease which is treated medically.  He uses walker for ambulation.  Thankfully denies chest pain - Hs-troponin 28>>69>>99>>130>>132 -Continue Plavix, statin and  Imdur  4.  CKD stage III - Scr 1.45>>1.3>>1.6 -Unknown baseline.  5.  Paroxysmal atrial fibrillation -Maintaining sinus rhythm on telemetry at controlled rate -Continue amiodarone and Eliquis  6.  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  Uncontrolled diabetes mellitus -Hemoglobin A1c 9 -Treatment per primary team  Dr. Radford Pax to see`   Risk Assessment/Risk Scores:   New York Heart Association (NYHA) Functional Class NYHA Class II  CHA2DS2-VASc Score = 5   This indicates a 7.2% annual risk of stroke. The patient's score is based upon: CHF History: 1 HTN History: 1 Diabetes History: 1 Stroke History: 0 Vascular Disease History: 1 Age Score: 1 Gender Score: 0     For questions or updates, please contact [facility] Please consult [email or web address] for contact info under    Jarrett Soho, PA  01/18/2022 1:10 PM

## 2022-01-18 NOTE — Assessment & Plan Note (Signed)
-  Continue Lipitor °

## 2022-01-18 NOTE — ED Notes (Addendum)
CBG 236, did not cross from glucometer ?

## 2022-01-18 NOTE — Assessment & Plan Note (Signed)
Chronic stable continue home medications ?

## 2022-01-18 NOTE — Assessment & Plan Note (Signed)
Right lower extremity appear to be swollen and red consistent with mild cellulitis will cover for now that doxycycline at order cellulitis protocol ?

## 2022-01-18 NOTE — Assessment & Plan Note (Signed)
this patient has acute respiratory failure with Hypoxia   as documented by the presence of following: ?O2 saturatio< 90% on RA Likely due to:  Pneumonia, CHF exacerbation  ?Provide O2 therapy and titrate as needed ? Continuous pulse ox ? check Pulse ox with ambulation prior to discharge ?  may need  TC consult for home O2 set up ?  ?

## 2022-01-18 NOTE — Assessment & Plan Note (Signed)
-  SIRS criteria met with elevated white blood cell count,    ?   ?Component Value Date/Time  ? WBC 14.4 (H) 01/17/2022 2154  ? LYMPHSABS 2.5 01/17/2022 2154  ? ? ? tachycardia   ,  fever   RR >20 ?Today's Vitals  ? 01/18/22 0000 01/18/22 0002 01/18/22 0005 01/18/22 0015  ?BP: 128/75 (!) 114/55  (!) 124/56  ?Pulse: (!) 105 (!) 102  99  ?Resp: (!) 25 (!) 27  (!) 28  ?Temp:      ?TempSrc:      ?SpO2: 95% 97% 97% 93%  ?PainSc:      ?  ?  ?-Most likely source being  pulmonary  ? Patient meeting criteria for Severe sepsis with  ?  evidence of end organ damage/organ dysfunction such as  ?  ?  elevated lactic acid >2  ?   ?Component Value Date/Time  ? LATICACIDVEN 2.7 ([place]) 01/17/2022 2214  ? ?   ? Acute hypoxia requiring new supplemental oxygen, SpO2: 93 % ?O2 Flow Rate (L/min): 5 L/min ?FiO2 (%): 40 % ?  ? - Obtain serial lactic acid and procalcitonin level. ? - Initiated IV antibiotics in ER: ?Antibiotics Given (last 72 hours)   ? Date/Time Action Medication Dose Rate  ? 01/17/22 2308 New Bag/Given  ? azithromycin (ZITHROMAX) 500 mg in sodium chloride 0.9 % 250 mL IVPB 500 mg 250 mL/hr  ? 01/17/22 2310 New Bag/Given  ? cefTRIAXone (ROCEPHIN) 1 g in sodium chloride 0.9 % 100 mL IVPB 1 g 200 mL/hr  ?  ? ? ?Will continue  on Rocephin doxycycline ? ? - await results of blood and urine culture ?   ?12:59 AM ? ?

## 2022-01-18 NOTE — Consult Note (Signed)
WOC Nurse Consult Note: ?Patient receiving care in [place] ?Patient speaks [place]. Family member in room that speaks some [place]. Patient Speaks very little English ? with Bedside nurse to determine what needed to be seen today. States no wounds except RLE with edema in BLE. ?Reason for Consult: Chronic wounds ?Wound type: Small blister on the RLE with edema in BLE. Some scarring noted on the LLE. Skin on the RLE is dry with a moderate amount of erythema. No drainage.  ?Pressure Injury POA: NA ?Dressing procedure/placement/frequency: ?Noted on MAR patient is scheduled for Lasix daily. No wound treatment recommended at this time. May apply Sween moisturizing lotion to the BLE twice daily. Monitor small blister on the RLE but allow to rupture on its own. If needed may cover with a small piece of Xeroform and a foam dressing. Change Xeroform daily.  ? ?Monitor the wound area(s) for worsening of condition such as: ?Signs/symptoms of infection, increase in size, development of or worsening of odor, ?development of pain, or increased pain at the affected locations.   ?Notify the medical team if any of these develop. ? ?Thank you for the consult. [place] nurse will not follow at this time.   ?Please re-consult the [facility] team if needed. ? ?Cathlean [place]se[place]. Tamala Julian, MSN, RN, CMSRN, AGCNS, WTA ?Wound Treatment Associate ?Pager [phone]   ? ? ? ? ?  ?

## 2022-01-18 NOTE — Progress Notes (Signed)
Initial Nutrition Assessment ? ?DOCUMENTATION CODES:  ? ?Obesity unspecified ? ?INTERVENTION:  ?- HS snack  ? ?- Encourage PO intake  ? ? ?NUTRITION DIAGNOSIS:  ? ?Increased nutrient needs related to acute illness (acute onset of respiratory distress secondary to pneumonia) as evidenced by estimated needs. ? ? ?GOAL:  ? ?Patient will meet greater than or equal to 90% of their needs ? ? ?MONITOR:  ? ?PO intake, Weight trends, Labs ? ?REASON FOR ASSESSMENT:  ? ?Consult ?Assessment of nutrition requirement/status ? ?ASSESSMENT:  ? ?Pt is a 76 year old male with medical history significant of B-cell lymphoma, UTI's, CKD, DM2, OSA, CAD s/p PCI with DES to RCA 11/2020 on Plavix, PAF on Eliquis and amiodarone, NSVT,who presented to the ED with acute onset of respiratory distress and progressive leg swelling and was admitted for further evaluation and management of respiratory distress secondary to pneumonia, CHF. ? ?Pt is currently on a carb modified diet with no recorded meal completions in chart at this time.  ? ?Met with pt at bedside. Pt speaks [place] and very little [place]. Pt's wife present in room that was able to get their daughter, [name] (hospitalist at [facility]), on the phone call to serve as interpreter for pt. Per pt, pt endorses a good appetite today and reports having a good appetite at baseline. Per pt, pt has had breakfast and  lunch meal today and good PO intake. Pt reports eating three meals a day at baseline. Pt denies any general nausea, but does state that he had some nausea yesterday when he came into the hospital. Per pt, pt last BM was last Friday and reports occasional constipation, but not persistent. Pt lives at home with his wife and uses a walker to ambulate at baseline. Per pt and pt's daughter, pt eats really well at home and no known PO intake issues. Pt also reports that his weight has been steady and no known weight loss.  ? ?Discussed the importance of good nutrition and adequate PO intake  for healing and to meet nutritional needs. Encouraged pt to continue eating consistent meals throughout the day due to good appetite. Discussed oral nutrition supplementation with pt and pt's family and pt refusing Ensure and other liquid nutrition supplements at this time. Discussed MVI supplementation and pt refuses at this time and states that he is taking too many medications right now. Pt is agreeable to HS snack to be provided to aid in energy intake.  ? ?Weight history reviewed.  ?Current wt: 121.5 kg  ? ?Medications reviewed and include: Lasix, Novolog, Semglee, rocephin, vibramycin ? ?Labs reviewed and include:  ?CBG: 335 ?Glucose: 350  ?A1C: 9.0  ?Corrected calcium: 9.0  ?Magnesium: 1.6  ?Hemoglobin: 9.3  ?BUN: 25  ?Creatinine: 1.60  ? ? ?NUTRITION - FOCUSED PHYSICAL EXAM: ? ?Flowsheet Row Most Recent Value  ?Orbital Region No depletion  ?Upper Arm Region Mild depletion  ?Thoracic and Lumbar Region No depletion  ?Buccal Region No depletion  ?Temple Region No depletion  ?Clavicle Bone Region No depletion  ?Clavicle and Acromion Bone Region No depletion  ?Scapular Bone Region No depletion  ?Dorsal Hand No depletion  ?Patellar Region No depletion  ?Anterior Thigh Region No depletion  ?Posterior Calf Region Mild depletion  ?Edema (RD Assessment) Mild  ?Hair Reviewed  ?Eyes Reviewed  ?Mouth Reviewed  ?Skin Reviewed  ?Nails Reviewed  ? ?  ? ? ?Diet Order:   ?Diet Order   ? ?       ?  Diet Carb Modified Fluid consistency: Thin; Room service appropriate? Yes  Diet effective now       ?  ? ?  ?  ? ?  ? ? ?EDUCATION NEEDS:  ? ?No education needs have been identified at this time ? ?Skin:  Skin Assessment: Reviewed RN Assessment ? ?Last BM:  3/3 ? ?Height:  ? ?Ht Readings from Last 1 Encounters:  ?01/18/22 6' (1.829 m)  ? ? ?Weight:  ? ?Wt Readings from Last 1 Encounters:  ?01/18/22 121.5 kg  ? ? ?Ideal Body Weight:  80.9 kg ? ?BMI:  Body mass index is 36.33 kg/m?. ? ?Estimated Nutritional Needs:  ? ?Kcal:  2400 -  2600 ? ?Protein:  120 - 135 grams ? ?Fluid:  >/= 2.4 L ? ? ? ?Maryruth Hancock, Dietetic Intern ?01/18/2022 5:14 PM ?

## 2022-01-18 NOTE — Progress Notes (Signed)
Trial off BIPAP. MD at bedside. Place on 4L [place]. No distress noted at this time. ?

## 2022-01-18 NOTE — Progress Notes (Signed)
Inpatient Diabetes Program Recommendations ? ?AACE/ADA: New Consensus Statement on Inpatient Glycemic Control (2015) ? ?Target Ranges:  Prepandial:   less than 140 mg/dL ?     Peak postprandial:   less than 180 mg/dL (1-2 hours) ?     Critically ill patients:  140 - 180 mg/dL  ? ?Lab Results  ?Component Value Date  ? GLUCAP 432 (H) 01/18/2022  ? HGBA1C 9.0 (H) 01/18/2022  ? ? ?Review of Glycemic Control ? ?Diabetes history: DM 2 ?Outpatient Diabetes medications: Metformin 1000 mg Daily, Glipizide 10 mg bid, Lantus 34 units qhs ?Current orders for Inpatient glycemic control:  ?Semglee 30 units ?Novolog 0-15 units tid + hs ?Novolog 3 units tid meal coverage ? ? ?Inpatient Diabetes Program Recommendations:   ? ?Spoke with pt and wife at bedside regarding A1c and glucose control at home.PT and wife reports glucose levels of 110-120 first thing in the morning but later in the day around 4 pm the glucose trends go into the low 200 range. Which is consistent with his A1c level of 9%. Discussed current A1c and glucose goals. Based on what they describe, pt is not compliant with diet. I told them he needs to watch out for carbohydrates and limit them. I also covered that he may need meal time insulin during the day. Encouraged close follow up with their PCP. ? ?Thanks, ? ?Tama Headings RN, MSN, BC-ADM ?Inpatient Diabetes Coordinator ?Team Pager [phone] (8a-5p) ?

## 2022-01-18 NOTE — Assessment & Plan Note (Addendum)
-   Pt diagnosed with CHF based on presence of the following , OA, rales on exam, JVD, cardiomegaly, Pulmonary edema on CXR, and  bilateral leg edema, DOE  With noted response to IV diuretic in ER ? ?admit on telemetry,  ?cycle cardiac enzymes, Troponin ? 28 ?obtain serial ECG  to evaluate for ischemia as a cause of heart failure ? monitor daily weight: There were no vitals filed for this visit. ?Last BNP BNP (last 3 results) ?Recent Labs  ?  01/17/22 ?2154  ?BNP 220.4*  ? ? got one dose of lasix in ER given elevated lactic acid wll hold off on more for now.  ?Need to reassess in am ?  monitor orthostatics and creatinine to avoid over diuresis. ? Order echogram to evaluate EF and valves ?  ?  cardiology  emailed ? ?

## 2022-01-18 NOTE — Evaluation (Signed)
Occupational Therapy Evaluation ?Patient Details ?Name: [name] ?MRN: [id number] ?DOB: 01-12-1946 ?Today's Date: 01/18/2022 ? ? ?History of Present Illness 76 yo admitted 3/5 with SOB and edema. 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 ?Clinical Impression ?  ?Prior to this admission, patient was living with his wife and getting assist for lower body dressing tasks due to bilateral edema in legs. Wife and friend present for session providing translation for OT as translator not working (no sound) and Google translate not a reliable translation.  Currently, patient is at his baseline, per wife and patient report. Patient able to demonstrate ADLs at previous level, therefore patient will not be added to OT caseload. OT encouraging movement throughout the day, and to continue to elevate legs due to noteable swelling when in recliner. No further acute OT needs identified at this time, please re-consult if further need arise.   ?   ? ?Recommendations for follow up therapy are one component of a multi-disciplinary discharge planning process, led by the attending physician.  Recommendations may be updated based on patient status, additional functional criteria and insurance authorization.  ? ?Follow Up Recommendations ? No OT follow up  ?  ?Assistance Recommended at Discharge PRN  ?Patient can return home with the following   ? ?  ?Functional Status Assessment ? Patient has had a recent decline in their functional status and demonstrates the ability to make significant improvements in function in a reasonable and predictable amount of time.  ?Equipment Recommendations ? None recommended by OT  ?  ?Recommendations for Other Services   ? ? ?  ?Precautions / Restrictions Precautions ?Precautions: None ?Restrictions ?Weight Bearing Restrictions: No  ? ?  ? ?Mobility Bed Mobility ?  ?  ?  ?  ?  ?  ?  ?General bed mobility comments: Up in  chair upon arrival ?  ? ?Transfers ?Overall transfer level: Modified independent ?  ?  ?  ?  ?  ?  ?  ?  ?  ?  ? ?  ?Balance Overall balance assessment: Mild deficits observed, not formally tested ?  ?  ?  ?  ?  ?  ?  ?  ?  ?  ?  ?  ?  ?  ?  ?  ?  ?  ?   ? ?ADL either performed or assessed with clinical judgement  ? ?ADL Overall ADL's : At baseline ?  ?  ?  ?  ?  ?  ?  ?  ?  ?  ?  ?  ?  ?  ?  ?  ?  ?  ?  ?General ADL Comments: Patient is at baseline per his report and wifes report, he has assist when his legs swell with lower body dressing, sits in the shower, and walks with a rollator. ADLs simulated in room and found to be at baseline per OT.  ? ? ? ?Vision Baseline Vision/History: 1 Wears glasses ?Ability to See in Adequate Light: 0 Adequate ?Patient Visual Report: No change from baseline ?   ?   ?Perception   ?  ?Praxis   ?  ? ?Pertinent Vitals/Pain Pain Assessment ?Pain Assessment: No/denies pain  ? ? ? ?Hand Dominance   ?  ?Extremity/Trunk Assessment Upper Extremity Assessment ?Upper Extremity Assessment: Overall WFL for tasks assessed ?  ?Lower Extremity Assessment ?Lower Extremity Assessment: Overall WFL for tasks assessed ?  ?Cervical /  Trunk Assessment ?Cervical / Trunk Assessment: Normal ?  ?Communication Communication ?Communication: Prefers language other than [place];Interpreter utilized [employer] with no sound, used phone and friend to assist in translating) ?  ?Cognition Arousal/Alertness: Awake/alert ?Behavior During Therapy: [facility] for tasks assessed/performed ?Overall Cognitive Status: Within Functional Limits for tasks assessed ?  ?  ?  ?  ?  ?  ?  ?  ?  ?  ?  ?  ?  ?  ?  ?  ?General Comments: [place] speaking and HOH so difficult to fully assess but appropriate for conversation and basic functional tasks ?  ?  ?General Comments    ? ?  ?Exercises   ?  ?Shoulder Instructions    ? ? ?Home Living Family/patient expects to be discharged to:: Private residence ?Living Arrangements:  Spouse/significant other ?Available Help at Discharge: Family;Available 24 hours/day ?Type of Home: House ?Home Access: Stairs to enter ?Entrance Stairs-Number of Steps: 6 ?Entrance Stairs-Rails: Right;Left ?Home Layout: Two level;Able to live on main level with bedroom/bathroom ?  ?  ?Bathroom Shower/Tub: Walk-in shower ?  ?Bathroom Toilet: Standard ?  ?  ?Home Equipment: Shower seat - built in;Rollator (4 wheels) ?  ?  ?  ? ?  ?Prior Functioning/Environment Prior Level of Function : Needs assist ?  ?  ?  ?  ?  ?  ?Mobility Comments: walks with Rollator ?ADLs Comments: assist for lower body dressing ?  ? ?  ?  ?OT Problem List: Increased edema;Cardiopulmonary status limiting activity ?  ?   ?OT Treatment/Interventions:    ?  ?OT Goals(Current goals can be found in the care plan section) Acute Rehab OT Goals ?Patient Stated Goal: To go home ?OT Goal Formulation: With patient/family ?Time For Goal Achievement: 02/01/22 ?Potential to Achieve Goals: Good  ?OT Frequency:   ?  ? ?Co-evaluation   ?  ?  ?  ?  ? ?  ?AM-PAC OT "6 Clicks" Daily Activity     ?Outcome Measure Help from another person eating meals?: None ?Help from another person taking care of personal grooming?: None ?Help from another person toileting, which includes using toliet, bedpan, or urinal?: None ?Help from another person bathing (including washing, rinsing, drying)?: None ?Help from another person to put on and taking off regular upper body clothing?: None ?Help from another person to put on and taking off regular lower body clothing?: A Little ?6 Click Score: 23 ?  ?End of Session Nurse Communication: Mobility status;Other (comment) (No need for OT) ? ?Activity Tolerance: Patient tolerated treatment well ?Patient left: in chair;with call bell/phone within reach;with chair alarm set;with family/visitor present ? ?OT Visit Diagnosis: Unsteadiness on feet (R26.81);Other abnormalities of gait and mobility (R26.89)  ?              ?Time: 1740-8144 ?OT  Time Calculation (min): 16 min ?Charges:  OT General Charges ?$OT Visit: 1 Visit ?OT Evaluation ?$OT Eval Moderate Complexity: 1 Mod ? ?SUBJECTIVE:  ? ?Ascencion Dike ?01/18/2022, 10:12 AM ?

## 2022-01-18 NOTE — Assessment & Plan Note (Signed)
Continue Eliquis and metoprolol monitor blood pressure ?

## 2022-01-18 NOTE — Assessment & Plan Note (Addendum)
- -  Patient presenting with  productive cough, fever    Hypoxia and infiltrate  ?-Infiltrate on CXR and 2-3 characteristics (fever, leukocytosis, purulent sputum) are consistent with pneumonia. ?-This appears to be most likely community-acquired pneumonia.  ?  ? will admit for treatment of CAP will start on appropriate antibiotic coverage. - Rocephin/doxy ?  Obtain:  sputum cultures, ?                Obtain respiratory panel  ?                  influenza serologies negative ?                 COVID PCR negative  ?                blood cultures and sputum cultures ordered  ?                 strep pneumo UA antigen,  ?                check for Legionella antigen. ?               Provide oxygen as needed.  ?BIPAP PRN ?At this point unable to obtain CT with contrast on xarelto PE less likely ?May need additional imaging to clarify PNA vs CHF ? ?

## 2022-01-18 NOTE — Progress Notes (Signed)
Lower extremity venous RT study completed.   Please see CV Proc for preliminary results.   Itali Mckendry, RDMS, RVT  

## 2022-01-18 NOTE — Evaluation (Signed)
Physical Therapy Evaluation/ Discharge ?Patient Details ?Name: [name] ?MRN: [id number] ?DOB: 1946-06-28 ?Today's Date: 01/18/2022 ? ?History of Present Illness ? 76 yo admitted 3/5 with SOB and edema. 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 Pt pleasant but HOH with difficulty hearing interpreter Jacalyn Lefevre [phone]) on ipad. Pt able to provide PLOF and home setup. Pt moving well and maintaining SpO2 95-97% on RA throughout session. Pt reports baseline functional level with assist of wife at home as needed for lower body dressing and homemaking. Pt with bil LE edema with education for elevation at rest. No further acute therapy needs with acute mobility with nursing encouraged.    ?   ? ?Recommendations for follow up therapy are one component of a multi-disciplinary discharge planning process, led by the attending physician.  Recommendations may be updated based on patient status, additional functional criteria and insurance authorization. ? ?Follow Up Recommendations No PT follow up ? ?  ?Assistance Recommended at Discharge Intermittent Supervision/Assistance  ?Patient can return home with the following ? A little help with bathing/dressing/bathroom;Assistance with cooking/housework;Help with stairs or ramp for entrance ? ?  ?Equipment Recommendations None recommended by PT  ?Recommendations for Other Services ?    ?  ?Functional Status Assessment Patient has not had a recent decline in their functional status  ? ?  ?Precautions / Restrictions Precautions ?Precautions: None  ? ?  ? ?Mobility ? Bed Mobility ?Overal bed mobility: Modified Independent ?  ?  ?  ?  ?  ?  ?  ?  ? ?Transfers ?Overall transfer level: Modified independent ?  ?  ?  ?  ?  ?  ?  ?  ?  ?  ? ?Ambulation/Gait ?Ambulation/Gait assistance: Supervision ?Gait Distance (Feet): 400 Feet ?Assistive device: Rolling walker (2 wheels) ?Gait  Pattern/deviations: Step-through pattern ?  ?Gait velocity interpretation: >4.37 ft/sec, indicative of normal walking speed ?  ?General Gait Details: pt with good gait speed, cues at times to step into RW. Pt periodically lifting RW off the ground to walk without LOB ? ?Stairs ?  ?  ?  ?  ?  ? ?Wheelchair Mobility ?  ? ?Modified Rankin (Stroke Patients Only) ?  ? ?  ? ?Balance Overall balance assessment: Mild deficits observed, not formally tested ?  ?  ?  ?  ?  ?  ?  ?  ?  ?  ?  ?  ?  ?  ?  ?  ?  ?  ?   ? ? ? ?Pertinent Vitals/Pain Pain Assessment ?Pain Assessment: No/denies pain  ? ? ?Home Living Family/patient expects to be discharged to:: Private residence ?Living Arrangements: Spouse/significant other ?Available Help at Discharge: Family;Available 24 hours/day ?Type of Home: House ?Home Access: Stairs to enter ?Entrance Stairs-Rails: Right;Left ?Entrance Stairs-Number of Steps: 6 ?  ?Home Layout: Two level;Able to live on main level with bedroom/bathroom ?Home Equipment: Shower seat - built in;Rollator (4 wheels) ?   ?  ?Prior Function Prior Level of Function : Needs assist ?  ?  ?  ?  ?  ?  ?Mobility Comments: walks with Rollator ?ADLs Comments: assist for lower body dressing ?  ? ? ?Hand Dominance  ?   ? ?  ?Extremity/Trunk Assessment  ? Upper Extremity Assessment ?Upper Extremity Assessment: Overall WFL for tasks assessed ?  ? ?Lower Extremity Assessment ?Lower Extremity Assessment: Overall WFL for tasks assessed ?  ? ?  Cervical / Trunk Assessment ?Cervical / Trunk Assessment: Normal  ?Communication  ? Communication: Prefers language other than [place];Interpreter utilized [place]. Jacalyn Lefevre #[id number])  ?Cognition Arousal/Alertness: Awake/alert ?Behavior During Therapy: [facility] for tasks assessed/performed ?Overall Cognitive Status: Within Functional Limits for tasks assessed ?  ?  ?  ?  ?  ?  ?  ?  ?  ?  ?  ?  ?  ?  ?  ?  ?General Comments: [place] speaking and HOH so difficult to fully assess but appropriate for  conversation and basic functional tasks ?  ?  ? ?  ?General Comments   ? ?  ?Exercises    ? ?Assessment/Plan  ?  ?PT Assessment Patient does not need any further PT services  ?PT Problem List   ? ?   ?  ?PT Treatment Interventions     ? ?PT Goals (Current goals can be found in the Care Plan section)  ?Acute Rehab PT Goals ?Patient Stated Goal: return home ?PT Goal Formulation: All assessment and education complete, DC therapy ? ?  ?Frequency   ?  ? ? ?Co-evaluation   ?  ?  ?  ?  ? ? ?  ?AM-PAC PT "6 Clicks" Mobility  ?Outcome Measure Help needed turning from your back to your side while in a flat bed without using bedrails?: None ?Help needed moving from lying on your back to sitting on the side of a flat bed without using bedrails?: None ?Help needed moving to and from a bed to a chair (including a wheelchair)?: None ?Help needed standing up from a chair using your arms (e.g., wheelchair or bedside chair)?: None ?Help needed to walk in hospital room?: A Little ?Help needed climbing 3-5 steps with a railing? : A Little ?6 Click Score: 22 ? ?  ?End of Session   ?Activity Tolerance: Patient tolerated treatment well ?Patient left: in chair;with call bell/phone within reach;with chair alarm set ?Nurse Communication: Mobility status ?PT Visit Diagnosis: Other abnormalities of gait and mobility (R26.89) ?  ? ?Time: 0100-7121 ?PT Time Calculation (min) (ACUTE ONLY): 31 min ? ? ?Charges:   PT Evaluation ?$PT Eval Moderate Complexity: 1 Mod ?  ?  ?   ? ? ?Layken Beg P, PT ?Acute Rehabilitation Services ?Pager: [phone] ?Office: [phone] ? ? ?Leandra Vanderweele B Fraya Ueda ?01/18/2022, 9:55 AM ? ?

## 2022-01-19 ENCOUNTER — Inpatient Hospital Stay (HOSPITAL_COMMUNITY): Payer: Medicare HMO

## 2022-01-19 DIAGNOSIS — I5043 Acute on chronic combined systolic (congestive) and diastolic (congestive) heart failure: Secondary | ICD-10-CM | POA: Diagnosis not present

## 2022-01-19 DIAGNOSIS — J189 Pneumonia, unspecified organism: Secondary | ICD-10-CM | POA: Diagnosis not present

## 2022-01-19 DIAGNOSIS — J9601 Acute respiratory failure with hypoxia: Secondary | ICD-10-CM | POA: Diagnosis not present

## 2022-01-19 LAB — COMPREHENSIVE METABOLIC PANEL
ALT: 30 U/L (ref 0–44)
AST: 22 U/L (ref 15–41)
Albumin: 2.9 g/dL — ABNORMAL LOW (ref 3.5–5.0)
Alkaline Phosphatase: 67 U/L (ref 38–126)
Anion gap: 9 (ref 5–15)
BUN: 35 mg/dL — ABNORMAL HIGH (ref 8–23)
CO2: 27 mmol/L (ref 22–32)
Calcium: 8.6 mg/dL — ABNORMAL LOW (ref 8.9–10.3)
Chloride: 104 mmol/L (ref 98–111)
Creatinine, Ser: 1.5 mg/dL — ABNORMAL HIGH (ref 0.61–1.24)
GFR, Estimated: 49 mL/min — ABNORMAL LOW (ref >60)
Glucose, Bld: 165 mg/dL — ABNORMAL HIGH (ref 70–99)
Potassium: 4 mmol/L (ref 3.5–5.1)
Sodium: 140 mmol/L (ref 135–145)
Total Bilirubin: 0.4 mg/dL (ref 0.3–1.2)
Total Protein: 4.7 g/dL — ABNORMAL LOW (ref 6.5–8.1)

## 2022-01-19 LAB — GLUCOSE, CAPILLARY
Glucose-Capillary: 150 mg/dL — ABNORMAL HIGH (ref 70–99)
Glucose-Capillary: 174 mg/dL — ABNORMAL HIGH (ref 70–99)
Glucose-Capillary: 180 mg/dL — ABNORMAL HIGH (ref 70–99)
Glucose-Capillary: 190 mg/dL — ABNORMAL HIGH (ref 70–99)
Glucose-Capillary: 195 mg/dL — ABNORMAL HIGH (ref 70–99)
Glucose-Capillary: 201 mg/dL — ABNORMAL HIGH (ref 70–99)

## 2022-01-19 LAB — CBC
HCT: 26 % — ABNORMAL LOW (ref 39.0–52.0)
Hemoglobin: 8.3 g/dL — ABNORMAL LOW (ref 13.0–17.0)
MCH: 23.2 pg — ABNORMAL LOW (ref 26.0–34.0)
MCHC: 31.9 g/dL (ref 30.0–36.0)
MCV: 72.6 fL — ABNORMAL LOW (ref 80.0–100.0)
Platelets: 196 10*3/uL (ref 150–400)
RBC: 3.58 MIL/uL — ABNORMAL LOW (ref 4.22–5.81)
RDW: 15.1 % (ref 11.5–15.5)
WBC: 9.5 10*3/uL (ref 4.0–10.5)
nRBC: 0 % (ref 0.0–0.2)

## 2022-01-19 LAB — URINE CULTURE

## 2022-01-19 LAB — MAGNESIUM: Magnesium: 2.1 mg/dL (ref 1.7–2.4)

## 2022-01-19 MED ORDER — IPRATROPIUM-ALBUTEROL 0.5-2.5 (3) MG/3ML IN SOLN
3.0000 mL | Freq: Two times a day (BID) | RESPIRATORY_TRACT | Status: DC
  Administered 2022-01-19 – 2022-01-22 (×6): 3 mL via RESPIRATORY_TRACT
  Filled 2022-01-19 (×6): qty 3

## 2022-01-19 MED ORDER — IPRATROPIUM-ALBUTEROL 0.5-2.5 (3) MG/3ML IN SOLN: 3.0000 mL | Freq: Four times a day (QID) | RESPIRATORY_TRACT | Status: DC | PRN

## 2022-01-19 MED ORDER — FUROSEMIDE 10 MG/ML IJ SOLN
40.0000 mg | Freq: Once | INTRAMUSCULAR | Status: AC
  Administered 2022-01-19: 40 mg via INTRAVENOUS
  Filled 2022-01-19: qty 4

## 2022-01-19 NOTE — Progress Notes (Signed)
Pt. Refused cpap. 

## 2022-01-19 NOTE — Progress Notes (Signed)
PROGRESS NOTE  [name] HYQ:[id number] DOB: 11-22-1945 DOA: 01/17/2022 PCP: [name], MD   LOS: 2 days   Brief Narrative / Interim history: This is a 76 yo M with history of DM2, chronic kidney disease, diffuse non-Hodgkin's lymphoma, hypertension, OSA, coronary artery disease with history of PCI with DES to RCA January 2022 on Plavix, PAF on Eliquis and amiodarone, NSVT, who comes into the hospital with severe shortness of breath and worsening of his leg swelling.  Wife also felt that he had a fever.  He required BiPAP on admission.  Of note, he was admitted last July with concerns for endocarditis and treated with 6 weeks of antibiotics via PICC line.  Chest x-ray this admission showed bilateral lower lobe airspace disease worrisome for multifocal pneumonia or edema, and stable cardiomegaly.  He was given Lasix, placed on IV antibiotics and admitted to the hospital.  Subjective / 24h Interval events: Had an episode of coughing up blood last night.  Increased wheezing this morning and felt more short of breath overnight.  Assesement and Plan: Principal Problem:   CAP (community acquired pneumonia) Active Problems:   DM2 (diabetes mellitus, type 2) ([place])   HTN (hypertension)   HLD (hyperlipidemia)   Paroxysmal A-fib (HCC)   Diffuse non-Hodgkin's lymphoma (HCC)   Hypomagnesemia   Cellulitis   Sepsis ([place])   Acute respiratory failure with hypoxia (HCC)   Acute on chronic combined systolic and diastolic CHF (congestive heart failure) (HCC)   Lactic acidosis   Assessment and Plan: Principal problem Acute hypoxic respiratory failure, likely multifactorial in the setting of acute on chronic combined CHF, possible community-acquired pneumonia, hemoptysis-continue supportive care with supplemental oxygen to maintain sats above 90%.  Continue Lasix as well as antibiotics while monitoring cultures and clinical course.  Still appears fluid overloaded.  Hemoptysis was minimal,  last night, no significant recurrence overnight.  Repeat a chest x-ray today  Active problems Acute on chronic combined CHF-a 2D echo shows an EF 55 to 60%, normal LVEF, grade 1 diastolic dysfunction.  Cardiology was consulted and following, appreciate input.  He is EF was as low as 45% in the past.  Continue IV Lasix, closely monitor ins and outs, renal function.  Placed TED hoses  Troponin elevation-still climbing but overall flat, likely demand ischemia.  Cardiology to see as well.  Acute hypoxic respiratory failure-supplemental O2 to maintain sats above 90%  CKD 3B-most recent creatinine as an outpatient past February was 1.7.  Currently within his baseline, continue to closely monitor while on IV Lasix.  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  Most recent evaluation December 2020 with excellent response to treatment  Paroxysmal A-fib-continue amiodarone, Eliquis  Coronary artery disease-with history of stenting, continue Plavix  Hyperlipidemia-continue statin  BPH-continue Flomax  Anemia of chronic disease-hemoglobin overall stable, 8.3 this morning.  Monitor  Type 2 diabetes mellitus, poorly controlled, with hyperglycemia-increase long-acting, add mealtime insulin given elevated CBGs this morning.  Hemoglobin A1c 9.0  CBG (last 3)  Recent Labs    01/19/22 0408 01/19/22 0808 01/19/22 1210  GLUCAP 201* 174* 190*    History of complicated UTI with sepsis, Pseudomonas bacteremia, possible endocarditis-June 2022.  Stable, monitor cultures currently  Scheduled Meds:  amiodarone  200 mg Oral Daily   apixaban  5 mg Oral BID   atorvastatin  20 mg Oral QHS   clopidogrel  75 mg Oral Daily   finasteride  5 mg Oral QHS   furosemide  40 mg  Intravenous Daily   guaiFENesin  600 mg Oral BID   insulin aspart  0-15 Units Subcutaneous TID WC   insulin aspart  0-5 Units Subcutaneous QHS   insulin aspart  3 Units Subcutaneous TID WC   insulin  glargine-yfgn  30 Units Subcutaneous Q2200   ipratropium-albuterol  3 mL Nebulization BID   isosorbide mononitrate  30 mg Oral Daily   metoprolol tartrate  50 mg Oral BID   pantoprazole  40 mg Oral BID AC   sodium chloride flush  3 mL Intravenous Q12H   tamsulosin  0.4 mg Oral QPC supper   terazosin  2 mg Oral QHS   Continuous Infusions:  sodium chloride     cefTRIAXone (ROCEPHIN)  IV 2 g (01/18/22 2056)   doxycycline (VIBRAMYCIN) IV 100 mg (01/19/22 0602)   PRN Meds:.sodium chloride, acetaminophen **OR** acetaminophen, alum & mag hydroxide-simeth, HYDROcodone-acetaminophen, ipratropium-albuterol, ondansetron (ZOFRAN) IV, polyethylene glycol, sodium chloride flush  Diet Orders (From admission, onward)     Start     Ordered   01/18/22 0114  Diet Carb Modified Fluid consistency: Thin; Room service appropriate? Yes  Diet effective now       Question Answer Comment  Diet-HS Snack? Nothing   Calorie Level Medium 1600-2000   Fluid consistency: Thin   Room service appropriate? Yes      01/18/22 0113            DVT prophylaxis: Place TED hose Start: 01/18/22 0955 apixaban (ELIQUIS) tablet 5 mg   Lab Results  Component Value Date   PLT 196 01/19/2022      Code Status: Full Code  Family Communication: wife and son at bedside   Status is: Inpatient  Remains inpatient appropriate because: severity of illness  Level of care: Progressive  Consultants:  Cardiology   Procedures:  2D echo  Microbiology  Blood cultures-pending  Antimicrobials: Ceftriaxone / Azithromycin 3/5 >>    Objective: Vitals:   01/19/22 0408 01/19/22 0500 01/19/22 0718 01/19/22 0955  BP: (!) 124/59     Pulse: 70  66 74  Resp:   16   Temp: 98.2 F (36.8 C)     TempSrc: Oral     SpO2: 98%  99%   Weight:  116.3 kg    Height:        Intake/Output Summary (Last 24 hours) at 01/19/2022 1358 Last data filed at 01/19/2022 1214 Gross per 24 hour  Intake 510 ml  Output 1750 ml  Net -1240 ml     Wt Readings from Last 3 Encounters:  01/19/22 116.3 kg    Examination:  Constitutional: NAD Eyes: lids and conjunctivae normal, no scleral icterus ENMT: mmm Neck: normal, supple Respiratory: Significant end expiratory wheezing, moves air well Cardiovascular: Regular rate and rhythm, no murmurs / rubs / gallops.  1-2+ pitting lower extremity edema Abdomen: soft, no distention, no tenderness. Bowel sounds positive.  Skin: no rashes Neurologic: no focal deficits, equal strength   Data Reviewed: I have independently reviewed following labs and imaging studies   CBC Recent Labs  Lab 01/17/22 2154 01/17/22 2202 01/17/22 2217 01/18/22 0204 01/19/22 0227  WBC 14.4*  --   --  10.7* 9.5  HGB 10.8* 11.2* 10.5* 9.3* 8.3*  HCT 34.6* 33.0* 31.0* 29.1* 26.0*  PLT 259  --   --  173 196  MCV 74.4*  --   --  73.1* 72.6*  MCH 23.2*  --   --  23.4* 23.2*  MCHC 31.2  --   --  32.0 31.9  RDW 15.1  --   --  15.0 15.1  LYMPHSABS 2.5  --   --  0.4*  --   MONOABS 1.2*  --   --  0.4  --   EOSABS 0.1  --   --  0.0  --   BASOSABS 0.1  --   --  0.0  --      Recent Labs  Lab 01/17/22 2154 01/17/22 2202 01/17/22 2214 01/17/22 2217 01/17/22 2330 01/18/22 0014 01/18/22 0021 01/18/22 0204 01/18/22 0445 01/19/22 0227  NA 137 137  --  137  --   --   --  137  --  140  K 4.8 4.7  --  4.4  --   --   --  4.1  --  4.0  CL 103 105  --   --   --   --   --  102  --  104  CO2 19*  --   --   --   --   --   --  22  --  27  GLUCOSE 308* 302*  --   --   --   --   --  350*  --  165*  BUN 23 26*  --   --   --   --   --  25*  --  35*  CREATININE 1.45* 1.30*  --   --   --   --   --  1.60*  --  1.50*  CALCIUM 8.8*  --   --   --   --   --   --  8.4*  --  8.6*  AST 36  --   --   --   --   --   --  29  --  22  ALT 36  --   --   --   --   --   --  32  --  30  ALKPHOS 97  --   --   --   --   --   --  77  --  67  BILITOT 0.5  --   --   --   --   --   --  0.4  --  0.4  ALBUMIN 3.6  --   --   --   --   --    --  3.2*  --  2.9*  MG 1.3*  --   --   --   --   --   --  1.6*  --  2.1  PROCALCITON <0.10  --   --   --   --   --   --   --   --   --   LATICACIDVEN  --   --  2.7*  --   --  2.9*  --  2.8* 2.1*  --   TSH  --   --   --   --  2.521  --   --  1.854  --   --   HGBA1C  --   --   --   --   --   --  9.0*  --   --   --   BNP 220.4*  --   --   --   --   --   --   --   --   --      ------------------------------------------------------------------------------------------------------------------ No results for input(s): CHOL, HDL, LDLCALC, TRIG, CHOLHDL, LDLDIRECT in  the last 72 hours.  Lab Results  Component Value Date   HGBA1C 9.0 (H) 01/18/2022   ------------------------------------------------------------------------------------------------------------------ Recent Labs    01/18/22 0204  TSH 1.854     Cardiac Enzymes No results for input(s): CKMB, TROPONINI, MYOGLOBIN in the last 168 hours.  Invalid input(s): CK ------------------------------------------------------------------------------------------------------------------    Component Value Date/Time   BNP 220.4 (H) 01/17/2022 2154    CBG: Recent Labs  Lab 01/18/22 2014 01/19/22 0000 01/19/22 0408 01/19/22 0808 01/19/22 1210  GLUCAP 172* 150* 201* 174* 190*     Recent Results (from the past 240 hour(s))  Resp Panel by RT-PCR (Flu A&B, Covid) Nasopharyngeal Swab     Status: None   Collection Time: 01/17/22 10:02 PM   Specimen: Nasopharyngeal Swab; Nasopharyngeal(NP) swabs in vial transport medium  Result Value Ref Range Status   SARS Coronavirus 2 by RT PCR NEGATIVE NEGATIVE Final    Comment: (NOTE) SARS-CoV-2 target nucleic acids are NOT DETECTED.  The SARS-CoV-2 RNA is generally detectable in upper respiratory specimens during the acute phase of infection. The lowest concentration of SARS-CoV-2 viral copies this assay can detect is 138 copies/mL. A negative result does not preclude SARS-Cov-2 infection and  should not be used as the sole basis for treatment or other patient management decisions. A negative result may occur with  improper specimen collection/handling, submission of specimen other than nasopharyngeal swab, presence of viral mutation(s) within the areas targeted by this assay, and inadequate number of viral copies(<138 copies/mL). A negative result must be combined with clinical observations, patient history, and epidemiological information. The expected result is Negative.  Fact Sheet for Patients:  EntrepreneurPulse.com.au  Fact Sheet for Healthcare Providers:  IncredibleEmployment.be  This test is no t yet approved or cleared by the [place] FDA and  has been authorized for detection and/or diagnosis of SARS-CoV-2 by FDA under an Emergency Use Authorization (EUA). This EUA will remain  in effect (meaning this test can be used) for the duration of the COVID-19 declaration under Section 564(b)(1) of the Act, 21 U.S.C.section 360bbb-3(b)(1), unless the authorization is terminated  or revoked sooner.       Influenza A by PCR NEGATIVE NEGATIVE Final   Influenza B by PCR NEGATIVE NEGATIVE Final    Comment: (NOTE) The Xpert Xpress SARS-CoV-2/FLU/RSV plus assay is intended as an aid in the diagnosis of influenza from Nasopharyngeal swab specimens and should not be used as a sole basis for treatment. Nasal washings and aspirates are unacceptable for Xpert Xpress SARS-CoV-2/FLU/RSV testing.  Fact Sheet for Patients: EntrepreneurPulse.com.au  Fact Sheet for Healthcare Providers: IncredibleEmployment.be  This test is not yet approved or cleared by the [place] FDA and has been authorized for detection and/or diagnosis of SARS-CoV-2 by FDA under an Emergency Use Authorization (EUA). This EUA will remain in effect (meaning this test can be used) for the duration of the COVID-19 declaration  under Section 564(b)(1) of the Act, 21 U.S.C. section 360bbb-3(b)(1), unless the authorization is terminated or revoked.  Performed at [facility], [place] [address], [place],  [address]   Respiratory (~20 pathogens) panel by PCR     Status: None   Collection Time: 01/17/22 10:02 PM   Specimen: Nasopharyngeal Swab; Respiratory  Result Value Ref Range Status   Adenovirus NOT DETECTED NOT DETECTED Final   Coronavirus 229E NOT DETECTED NOT DETECTED Final    Comment: (NOTE) The Coronavirus on the Respiratory Panel, DOES NOT test for the novel  Coronavirus (2019 nCoV)  Coronavirus HKU1 NOT DETECTED NOT DETECTED Final   Coronavirus NL63 NOT DETECTED NOT DETECTED Final   Coronavirus OC43 NOT DETECTED NOT DETECTED Final   Metapneumovirus NOT DETECTED NOT DETECTED Final   Rhinovirus / Enterovirus NOT DETECTED NOT DETECTED Final   Influenza A NOT DETECTED NOT DETECTED Final   Influenza B NOT DETECTED NOT DETECTED Final   Parainfluenza Virus 1 NOT DETECTED NOT DETECTED Final   Parainfluenza Virus 2 NOT DETECTED NOT DETECTED Final   Parainfluenza Virus 3 NOT DETECTED NOT DETECTED Final   Parainfluenza Virus 4 NOT DETECTED NOT DETECTED Final   Respiratory Syncytial Virus NOT DETECTED NOT DETECTED Final   Bordetella pertussis NOT DETECTED NOT DETECTED Final   Bordetella Parapertussis NOT DETECTED NOT DETECTED Final   Chlamydophila pneumoniae NOT DETECTED NOT DETECTED Final   Mycoplasma pneumoniae NOT DETECTED NOT DETECTED Final    Comment: Performed at [facility], [place] [address] [place], [place] [address]  Urine Culture     Status: Abnormal   Collection Time: 01/17/22 10:15 PM   Specimen: Urine, Clean Catch  Result Value Ref Range Status   Specimen Description URINE, CLEAN CATCH  Final   Special Requests   Final    NONE Performed at [facility], [place] [address], [place], [place] [address]    Culture MULTIPLE SPECIES PRESENT, SUGGEST RECOLLECTION (A)  Final    Report Status 01/19/2022 FINAL  Final  Blood culture (routine x 2)     Status: None (Preliminary result)   Collection Time: 01/17/22 10:39 PM   Specimen: BLOOD RIGHT FOREARM  Result Value Ref Range Status   Specimen Description BLOOD RIGHT FOREARM  Final   Special Requests   Final    BOTTLES DRAWN AEROBIC AND ANAEROBIC Blood Culture results may not be optimal due to an inadequate volume of blood received in culture bottles   Culture   Final    NO GROWTH 2 DAYS Performed at [facility], [place] [address], [place], [place] [address]    Report Status PENDING  Incomplete  Blood culture (routine x 2)     Status: None (Preliminary result)   Collection Time: 01/17/22 10:41 PM   Specimen: BLOOD RIGHT HAND  Result Value Ref Range Status   Specimen Description BLOOD RIGHT HAND  Final   Special Requests   Final    BOTTLES DRAWN AEROBIC AND ANAEROBIC Blood Culture results may not be optimal due to an inadequate volume of blood received in culture bottles   Culture   Final    NO GROWTH 2 DAYS Performed at [facility], [place] [address], [place],  [address]    Report Status PENDING  Incomplete      Radiology Studies: ECHOCARDIOGRAM COMPLETE  Result Date: 01/18/2022    ECHOCARDIOGRAM REPORT   Patient Name:   [name] Date of Exam: 01/18/2022 Medical Rec #:  [id number]        Height:       72.0 in Accession #:    [id number]       Weight:       267.9 lb Date of Birth:  11/10/1946         BSA:          2.412 m Patient Age:    23 years         BP:           122/62 mmHg Patient Gender: M  HR:           77 bpm. Exam Location:  Inpatient Procedure: 2D Echo and Intracardiac Opacification Agent Indications:    Acute systolic CHF  History:        Patient has no prior history of Echocardiogram examinations.  Sonographer:    [name] Referring Phys: [place]  1. Left ventricular ejection fraction, by estimation, is 55 to 60%. The left ventricle has  normal function. Left ventricular endocardial border not optimally defined to evaluate regional wall motion. Left ventricular diastolic parameters are consistent with Grade I diastolic dysfunction (impaired relaxation).  2. Right ventricular systolic function is normal. The right ventricular size is normal. Tricuspid regurgitation signal is inadequate for assessing PA pressure.  3. Left atrial size was mild to moderately dilated.  4. Right atrial size was mild to moderately dilated.  5. The mitral valve is normal in structure. No evidence of mitral valve regurgitation. No evidence of mitral stenosis.  6. The aortic valve is tricuspid. There is moderate calcification of the aortic valve. Aortic valve regurgitation is not visualized. Mild aortic valve stenosis. Aortic valve mean gradient measures 13.0 mmHg.  7. Aortic dilatation noted. 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  8. The inferior vena cava is normal in size with greater than 50% respiratory variability, suggesting right atrial pressure of 3 mmHg.  9. Technically difficult study with poor acoustic windows. FINDINGS  Left Ventricle: Left ventricular ejection fraction, by estimation, is 55 to 60%. The left ventricle has normal function. Left ventricular endocardial border not optimally defined to evaluate regional wall motion. Definity contrast agent was given IV to delineate the left ventricular endocardial borders. The left ventricular internal cavity size was normal in size. There is no left ventricular hypertrophy. Left ventricular diastolic parameters are consistent with Grade I diastolic dysfunction (impaired relaxation). Right Ventricle: The right ventricular size is normal. No increase in right ventricular wall thickness. Right ventricular systolic function is normal. Tricuspid regurgitation signal is inadequate for assessing PA pressure. Left Atrium: Left atrial size was mild to moderately dilated. Right Atrium: Right  atrial size was mild to moderately dilated. Pericardium: There is no evidence of pericardial effusion. Mitral Valve: The mitral valve is normal in structure. Mild mitral annular calcification. No evidence of mitral valve regurgitation. No evidence of mitral valve stenosis. Tricuspid Valve: The tricuspid valve is normal in structure. Tricuspid valve regurgitation is trivial. Aortic Valve: The aortic valve is tricuspid. There is moderate calcification of the aortic valve. Aortic valve regurgitation is not visualized. Mild aortic stenosis is present. Aortic valve mean gradient measures 13.0 mmHg. Aortic valve peak gradient measures 17.1 mmHg. Aortic valve area, by VTI measures 2.10 cm. Pulmonic Valve: The pulmonic valve was normal in structure. Pulmonic valve regurgitation is not visualized. Aorta: The aortic root is normal in size and structure and aortic dilatation noted. 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 Venous: The inferior vena cava is normal in size with greater than 50% respiratory variability, suggesting right atrial pressure of 3 mmHg. IAS/Shunts: No atrial level shunt detected by color flow Doppler.  LEFT VENTRICLE PLAX 2D LVIDd:         5.73 cm   Diastology LVIDs:         4.02 cm   LV e' medial:    6.53 cm/s LV PW:         1.06 cm   LV E/e' medial:  16.5 LV  IVS:        1.06 cm   LV e' lateral:   6.96 cm/s LVOT diam:     2.30 cm   LV E/e' lateral: 15.5 LV SV:         99 LV SV Index:   41 LVOT Area:     4.15 cm  RIGHT VENTRICLE RV Basal diam:  4.42 cm RV Mid diam:    3.54 cm RV S prime:     18.20 cm/s TAPSE (M-mode): 2.4 cm LEFT ATRIUM             Index        RIGHT ATRIUM           Index LA diam:        5.00 cm 2.07 cm/m   RA Area:     27.20 cm LA Vol (A2C):   83.7 ml 34.70 ml/m  RA Volume:   80.10 ml  33.21 ml/m LA Vol (A4C):   97.2 ml 40.29 ml/m LA Biplane Vol: 90.4 ml 37.48 ml/m  AORTIC VALVE AV Area (Vmax):    1.97 cm AV Area (Vmean):   2.01 cm AV Area (VTI):      2.10 cm AV Vmax:           207.00 cm/s AV Vmean:          146.000 cm/s AV VTI:            0.472 m AV Peak Grad:      17.1 mmHg AV Mean Grad:      13.0 mmHg LVOT Vmax:         98.25 cm/s LVOT Vmean:        70.750 cm/s LVOT VTI:          0.238 m LVOT/AV VTI ratio: 0.50  AORTA Ao Root diam: 3.40 cm Ao Asc diam:  4.70 cm MITRAL VALVE MV Area (PHT): 2.95 cm     SHUNTS MV Decel Time: 257 msec     Systemic VTI:  0.24 m MV E velocity: 108.00 cm/s  Systemic Diam: 2.30 cm MV A velocity: 104.00 cm/s MV E/A ratio:  1.04 Dalton McleanMD Electronically signed by Franki Monte Signature Date/Time: 01/18/2022/4:58:31 PM    Final    VAS [place] LOWER EXTREMITY VENOUS (DVT)  Result Date: 01/18/2022  Lower Venous DVT Study Patient Name:  [name]  Date of Exam:   01/18/2022 Medical Rec #: [id number]         Accession #:    [id number] Date of Birth: 17-Feb-1946          Patient Gender: M Patient Age:   89 years Exam Location:  [facility] Procedure:      VAS [place] LOWER EXTREMITY VENOUS (DVT) Referring Phys: Nyoka Lint DOUTOVA --------------------------------------------------------------------------------  Indications: Edema Rt>Lt.  Risk Factors: CHF, non-Hodgkin lymphoma. Anticoagulation: Eliquis. Comparison Study: No prior studies. Performing Technologist: Darlin Coco RDMS, RVT  Examination Guidelines: A complete evaluation includes B-mode imaging, spectral Doppler, color Doppler, and power Doppler as needed of all accessible portions of each vessel. Bilateral testing is considered an integral part of a complete examination. Limited examinations for reoccurring indications may be performed as noted. The reflux portion of the exam is performed with the patient in reverse Trendelenburg.  +---------+---------------+---------+-----------+----------+--------------+  RIGHT     Compressibility Phasicity Spontaneity Properties Thrombus Aging  +---------+---------------+---------+-----------+----------+--------------+  CFV        Full            Yes  Yes                                    +---------+---------------+---------+-----------+----------+--------------+  SFJ       Full                                                             +---------+---------------+---------+-----------+----------+--------------+  FV Prox   Full                                                             +---------+---------------+---------+-----------+----------+--------------+  FV Mid    Full                                                             +---------+---------------+---------+-----------+----------+--------------+  FV Distal Full                                                             +---------+---------------+---------+-----------+----------+--------------+  PFV       Full                                                             +---------+---------------+---------+-----------+----------+--------------+  POP       Full            Yes       Yes                                    +---------+---------------+---------+-----------+----------+--------------+  PTV       Full                                                             +---------+---------------+---------+-----------+----------+--------------+  PERO      Full                                                             +---------+---------------+---------+-----------+----------+--------------+  Gastroc   Full                                                             +---------+---------------+---------+-----------+----------+--------------+   +----+---------------+---------+-----------+----------+--------------+  LEFT Compressibility Phasicity Spontaneity Properties Thrombus Aging  +----+---------------+---------+-----------+----------+--------------+  CFV  Full            Yes       Yes                                    +----+---------------+---------+-----------+----------+--------------+    Summary: RIGHT: - There is no evidence of deep vein thrombosis in the lower  extremity.  - No cystic structure found in the popliteal fossa.  LEFT: - No evidence of common femoral vein obstruction.  *See table(s) above for measurements and observations. Electronically signed by Servando Snare MD on 01/18/2022 at 5:12:06 PM.    Final      Marzetta Board, MD, PhD [facility]  Between 7 am - 7 pm I am available, please contact me via Amion (for emergencies) or Securechat (non urgent messages)  Between 7 pm - 7 am I am not available, please contact night coverage MD/APP via Amion

## 2022-01-19 NOTE — Progress Notes (Signed)
Progress Note  Patient Name: [name] Date of Encounter: 01/19/2022  [facility] Cardiologist: New  Previously [facility]    Subjective   Patient laying in bed   Says h\is breathing OK   Denies CP  Says his stomach was upset earlier but now is hungry    Inpatient Medications    Scheduled Meds:  amiodarone  200 mg Oral Daily   apixaban  5 mg Oral BID   atorvastatin  20 mg Oral QHS   clopidogrel  75 mg Oral Daily   finasteride  5 mg Oral QHS   furosemide  40 mg Intravenous Daily   guaiFENesin  600 mg Oral BID   insulin aspart  0-15 Units Subcutaneous TID WC   insulin aspart  0-5 Units Subcutaneous QHS   insulin aspart  3 Units Subcutaneous TID WC   insulin glargine-yfgn  30 Units Subcutaneous Q2200   ipratropium-albuterol  3 mL Nebulization BID   isosorbide mononitrate  30 mg Oral Daily   metoprolol tartrate  50 mg Oral BID   pantoprazole  40 mg Oral BID AC   sodium chloride flush  3 mL Intravenous Q12H   tamsulosin  0.4 mg Oral QPC supper   terazosin  2 mg Oral QHS   Continuous Infusions:  sodium chloride     cefTRIAXone (ROCEPHIN)  IV 2 g (01/18/22 2056)   doxycycline (VIBRAMYCIN) IV 100 mg (01/19/22 0602)   PRN Meds: sodium chloride, acetaminophen **OR** acetaminophen, alum & mag hydroxide-simeth, HYDROcodone-acetaminophen, ipratropium-albuterol, ondansetron (ZOFRAN) IV, polyethylene glycol, sodium chloride flush   Vital Signs    Vitals:   01/19/22 0108 01/19/22 0408 01/19/22 0500 01/19/22 0718  BP:  (!) 124/59    Pulse:  70  66  Resp: 19   16  Temp:  98.2 F (36.8 C)    TempSrc:  Oral    SpO2: 95% 98%  99%  Weight:   116.3 kg   Height:        Intake/Output Summary (Last 24 hours) at 01/19/2022 0935 Last data filed at 01/19/2022 0409 Gross per 24 hour  Intake 720 ml  Output 700 ml  Net 20 ml   Last 3 Weights 01/19/2022 01/18/2022  Weight (lbs) 256 lb 6.3 oz 267 lb 13.7 oz  Weight (kg) 116.3 kg 121.5 kg      Telemetry    SR  - Personally  Reviewed  ECG     No new - Personally Reviewed  Physical Exam   GEN: No acute distress.  On O2 [place]   Neck: JVP is increased   Cardiac: RRR, Gr II/VI systolic murmur at base   Respiratory:  Moving air   Mild wheeze bilateral   More prominent upper airway wheezes   GI: Soft, nontender,Obese  MS: 1+ edema; No deformity. Neuro:  Nonfocal  Psych: Normal affect   Labs    High Sensitivity Troponin:   Recent Labs  Lab 01/17/22 2154 01/17/22 2349 01/18/22 0204 01/18/22 0445 01/18/22 0658  TROPONINIHS 28* 69* 99* 130* 132*     Chemistry Recent Labs  Lab 01/17/22 2154 01/17/22 2202 01/17/22 2217 01/18/22 0204 01/19/22 0227  NA 137 137 137 137 140  K 4.8 4.7 4.4 4.1 4.0  CL 103 105  --  102 104  CO2 19*  --   --  22 27  GLUCOSE 308* 302*  --  350* 165*  BUN 23 26*  --  25* 35*  CREATININE 1.45* 1.30*  --  1.60* 1.50*  CALCIUM 8.8*  --   --  8.4* 8.6*  MG 1.3*  --   --  1.6* 2.1  PROT 5.5*  --   --  5.1* 4.7*  ALBUMIN 3.6  --   --  3.2* 2.9*  AST 36  --   --  29 22  ALT 36  --   --  32 30  ALKPHOS 97  --   --  77 67  BILITOT 0.5  --   --  0.4 0.4  GFRNONAA 51*  --   --  45* 49*  ANIONGAP 15  --   --  13 9    Lipids No results for input(s): CHOL, TRIG, HDL, LABVLDL, LDLCALC, CHOLHDL in the last 168 hours.  Hematology Recent Labs  Lab 01/17/22 2154 01/17/22 2202 01/17/22 2217 01/18/22 0204 01/19/22 0227  WBC 14.4*  --   --  10.7* 9.5  RBC 4.65  --   --  3.98* 3.58*  HGB 10.8*   < > 10.5* 9.3* 8.3*  HCT 34.6*   < > 31.0* 29.1* 26.0*  MCV 74.4*  --   --  73.1* 72.6*  MCH 23.2*  --   --  23.4* 23.2*  MCHC 31.2  --   --  32.0 31.9  RDW 15.1  --   --  15.0 15.1  PLT 259  --   --  173 196   < > = values in this interval not displayed.   Thyroid  Recent Labs  Lab 01/18/22 0204  TSH 1.854    BNP Recent Labs  Lab 01/17/22 2154  BNP 220.4*    DDimer No results for input(s): DDIMER in the last 168 hours.   Radiology    DG Chest Port 1 View  Result  Date: 01/17/2022 CLINICAL DATA:  Shortness of breath. EXAM: PORTABLE CHEST 1 VIEW COMPARISON:  Chest x-ray 05/23/2021. FINDINGS: The heart is enlarged. There are patchy airspace opacities in both lung bases. No large pleural effusion or pneumothorax. No acute fractures. IMPRESSION: 1. Bilateral lower lobe airspace disease worrisome for multifocal pneumonia and/or edema. 2. Stable cardiomegaly. Electronically Signed   By: Ronney Asters M.D.   On: 01/17/2022 22:15   ECHOCARDIOGRAM COMPLETE  Result Date: 01/18/2022    ECHOCARDIOGRAM REPORT   Patient Name:   BILAL MANZER Date of Exam: 01/18/2022 Medical Rec #:  [id number]        Height:       72.0 in Accession #:    [id number]       Weight:       267.9 lb Date of Birth:  [birth year]         BSA:          2.412 m Patient Age:    76 years         BP:           122/62 mmHg Patient Gender: M                HR:           77 bpm. Exam Location:  Inpatient Procedure: 2D Echo and Intracardiac Opacification Agent Indications:    Acute systolic CHF  History:        Patient has no prior history of Echocardiogram examinations.  Sonographer:    [name] Referring Phys: [place]  1. Left ventricular ejection fraction, by estimation, is 55 to 60%. The left ventricle has normal function. Left ventricular endocardial border not  optimally defined to evaluate regional wall motion. Left ventricular diastolic parameters are consistent with Grade I diastolic dysfunction (impaired relaxation).  2. Right ventricular systolic function is normal. The right ventricular size is normal. Tricuspid regurgitation signal is inadequate for assessing PA pressure.  3. Left atrial size was mild to moderately dilated.  4. Right atrial size was mild to moderately dilated.  5. The mitral valve is normal in structure. No evidence of mitral valve regurgitation. No evidence of mitral stenosis.  6. The aortic valve is tricuspid. There is moderate calcification of the aortic valve.  Aortic valve regurgitation is not visualized. Mild aortic valve stenosis. Aortic valve mean gradient measures 13.0 mmHg.  7. Aortic dilatation noted. 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  8. The inferior vena cava is normal in size with greater than 50% respiratory variability, suggesting right atrial pressure of 3 mmHg.  9. Technically difficult study with poor acoustic windows. FINDINGS  Left Ventricle: Left ventricular ejection fraction, by estimation, is 55 to 60%. The left ventricle has normal function. Left ventricular endocardial border not optimally defined to evaluate regional wall motion. Definity contrast agent was given IV to delineate the left ventricular endocardial borders. The left ventricular internal cavity size was normal in size. There is no left ventricular hypertrophy. Left ventricular diastolic parameters are consistent with Grade I diastolic dysfunction (impaired relaxation). Right Ventricle: The right ventricular size is normal. No increase in right ventricular wall thickness. Right ventricular systolic function is normal. Tricuspid regurgitation signal is inadequate for assessing PA pressure. Left Atrium: Left atrial size was mild to moderately dilated. Right Atrium: Right atrial size was mild to moderately dilated. Pericardium: There is no evidence of pericardial effusion. Mitral Valve: The mitral valve is normal in structure. Mild mitral annular calcification. No evidence of mitral valve regurgitation. No evidence of mitral valve stenosis. Tricuspid Valve: The tricuspid valve is normal in structure. Tricuspid valve regurgitation is trivial. Aortic Valve: The aortic valve is tricuspid. There is moderate calcification of the aortic valve. Aortic valve regurgitation is not visualized. Mild aortic stenosis is present. Aortic valve mean gradient measures 13.0 mmHg. Aortic valve peak gradient measures 17.1 mmHg. Aortic valve area, by VTI measures 2.10 cm.  Pulmonic Valve: The pulmonic valve was normal in structure. Pulmonic valve regurgitation is not visualized. Aorta: The aortic root is normal in size and structure and aortic dilatation noted. 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 Venous: The inferior vena cava is normal in size with greater than 50% respiratory variability, suggesting right atrial pressure of 3 mmHg. IAS/Shunts: No atrial level shunt detected by color flow Doppler.  LEFT VENTRICLE PLAX 2D LVIDd:         5.73 cm   Diastology LVIDs:         4.02 cm   LV e' medial:    6.53 cm/s LV PW:         1.06 cm   LV E/e' medial:  16.5 LV IVS:        1.06 cm   LV e' lateral:   6.96 cm/s LVOT diam:     2.30 cm   LV E/e' lateral: 15.5 LV SV:         99 LV SV Index:   41 LVOT Area:     4.15 cm  RIGHT VENTRICLE RV Basal diam:  4.42 cm RV Mid diam:    3.54 cm RV S prime:     18.20 cm/s  TAPSE (M-mode): 2.4 cm LEFT ATRIUM             Index        RIGHT ATRIUM           Index LA diam:        5.00 cm 2.07 cm/m   RA Area:     27.20 cm LA Vol (A2C):   83.7 ml 34.70 ml/m  RA Volume:   80.10 ml  33.21 ml/m LA Vol (A4C):   97.2 ml 40.29 ml/m LA Biplane Vol: 90.4 ml 37.48 ml/m  AORTIC VALVE AV Area (Vmax):    1.97 cm AV Area (Vmean):   2.01 cm AV Area (VTI):     2.10 cm AV Vmax:           207.00 cm/s AV Vmean:          146.000 cm/s AV VTI:            0.472 m AV Peak Grad:      17.1 mmHg AV Mean Grad:      13.0 mmHg LVOT Vmax:         98.25 cm/s LVOT Vmean:        70.750 cm/s LVOT VTI:          0.238 m LVOT/AV VTI ratio: 0.50  AORTA Ao Root diam: 3.40 cm Ao Asc diam:  4.70 cm MITRAL VALVE MV Area (PHT): 2.95 cm     SHUNTS MV Decel Time: 257 msec     Systemic VTI:  0.24 m MV E velocity: 108.00 cm/s  Systemic Diam: 2.30 cm MV A velocity: 104.00 cm/s MV E/A ratio:  1.04 Dalton McleanMD Electronically signed by Franki Monte Signature Date/Time: 01/18/2022/4:58:31 PM    Final    VAS [place] LOWER EXTREMITY VENOUS (DVT)  Result Date: 01/18/2022   Lower Venous DVT Study Patient Name:  EMANUELE MCWHIRTER  Date of Exam:   01/18/2022 Medical Rec #: [id number]         Accession #:    [id number] Date of Birth: 17-May-1946          Patient Gender: M Patient Age:   76 years Exam Location:  [facility] Procedure:      VAS [place] LOWER EXTREMITY VENOUS (DVT) Referring Phys: Nyoka Lint DOUTOVA --------------------------------------------------------------------------------  Indications: Edema Rt>Lt.  Risk Factors: CHF, non-Hodgkin lymphoma. Anticoagulation: Eliquis. Comparison Study: No prior studies. Performing Technologist: Darlin Coco RDMS, RVT  Examination Guidelines: A complete evaluation includes B-mode imaging, spectral Doppler, color Doppler, and power Doppler as needed of all accessible portions of each vessel. Bilateral testing is considered an integral part of a complete examination. Limited examinations for reoccurring indications may be performed as noted. The reflux portion of the exam is performed with the patient in reverse Trendelenburg.  +---------+---------------+---------+-----------+----------+--------------+  RIGHT     Compressibility Phasicity Spontaneity Properties Thrombus Aging  +---------+---------------+---------+-----------+----------+--------------+  CFV       Full            Yes       Yes                                    +---------+---------------+---------+-----------+----------+--------------+  SFJ       Full                                                             +---------+---------------+---------+-----------+----------+--------------+  FV Prox   Full                                                             +---------+---------------+---------+-----------+----------+--------------+  FV Mid    Full                                                             +---------+---------------+---------+-----------+----------+--------------+  FV Distal Full                                                              +---------+---------------+---------+-----------+----------+--------------+  PFV       Full                                                             +---------+---------------+---------+-----------+----------+--------------+  POP       Full            Yes       Yes                                    +---------+---------------+---------+-----------+----------+--------------+  PTV       Full                                                             +---------+---------------+---------+-----------+----------+--------------+  PERO      Full                                                             +---------+---------------+---------+-----------+----------+--------------+  Gastroc   Full                                                             +---------+---------------+---------+-----------+----------+--------------+   +----+---------------+---------+-----------+----------+--------------+  LEFT Compressibility Phasicity Spontaneity Properties Thrombus Aging  +----+---------------+---------+-----------+----------+--------------+  CFV  Full            Yes       Yes                                    +----+---------------+---------+-----------+----------+--------------+  Summary: RIGHT: - There is no evidence of deep vein thrombosis in the lower extremity.  - No cystic structure found in the popliteal fossa.  LEFT: - No evidence of common femoral vein obstruction.  *See table(s) above for measurements and observations. Electronically signed by Servando Snare MD on 01/18/2022 at 5:12:06 PM.    Final     Cardiac Studies   Echo   01/18/22  Left ventricular ejection fraction, by estimation, is 55 to 60%. The left ventricle has normal function. Left ventricular endocardial border not optimally defined to evaluate regional wall motion. Left ventricular diastolic parameters are consistent with Grade I diastolic dysfunction (impaired relaxation). 1. Right ventricular systolic function is normal. The right  ventricular size is normal. Tricuspid regurgitation signal is inadequate for assessing PA pressure. 2. 3. Left atrial size was mild to moderately dilated. 4. Right atrial size was mild to moderately dilated. The mitral valve is normal in structure. No evidence of mitral valve regurgitation. No evidence of mitral stenosis. 5. The aortic valve is tricuspid. There is moderate calcification of the aortic valve. Aortic valve regurgitation is not visualized. Mild aortic valve stenosis. Aortic valve mean gradient measures 13.0 mmHg. 6. Aortic dilatation noted. 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 7. The inferior vena cava is normal in size with greater than 50% respiratory variability, suggesting right atrial pressure of 3 mmHg. 8. 9. Technically difficult study with poor acoustic windows.   [facility]     Caths   Dec 2021, Jan 2022 "Cardiac cath from - Oct 17, 2020 revealed severe coronary artery disease.  Prox Cx lesion is 70% stenosed.  1st Mrg lesion is 70% stenosed.  Mid LAD lesion is 70% stenosed.  1st Diag lesion is 99% stenosed.  Dist RCA lesion is 80% stenosed.  Prox RCA to Mid RCA lesion is 10% stenosed.  Dr. Maisie Fus planned another cardiac cath for January 7 to do FFR on proximal LAD and if not significant perform PCI of RCA. If FFR will review severe proximal LAD disease, patient would have multivessel cardiac bypass done of LAD, left circumflex and RCA.  Pt had cardiac cath on Nov 21, 2020 and FFR did not reveal severe stenosis of LAD with FFR 0.86-0.92 and by visual estimation 30 to 40% luminal narrowing. Patient had PCI of distal RCA with DES  Patient Profile     76 y.o. male with hx of CAD, PAF, NSVT, thoracic aneurysm, NHL, HTN, DM, CKD, OSA (on CPAP) and hx of possible endocarditis    Previously followed at [facility]    Following her for CHF   Pt admitted for sepsis    Assessment & Plan    1  Acute  on chronic diastolic CHF  Echo in  July 2022 LVEF 50 to 55%   Repeat echo LVEF and RVEF normal     On exam, still with some volume increase   Note he has prominent upper airway wheezhign as well  ? Reflux induced  Patient being treated wit hIV lasix   He has not responded yet    I would recomm increasing dose to 80 mg    Strict I/O    Follow renal function  On metoprolol 50 bid and imdure     SOme of edema may be due to amlodipine use as outpt   This has been stopped    2  CAD   pt with hx of CAD    s/p PTCA/DES to RCA  Mod dz elsewhere  Very mild elevations of trop  28, 69, 99, 130, 132    He denies CP   Echo with no wall motion abnormalities   Keep on current meds of Plavix, statin, imdur  3  PAF   CHADSVasc score is 5   Keep on Eliquis and amiodarone REmains in SR   4.  Possible endocarditis    June 2022    TEE with vegetation   Treated with IV ABX I have reviewed echo from this admit   Difficult windows   The MV is thickened but there is nothing to sugg a vegetation. AV is thickened, calcified   No definite vegation   The current TTE would not be suffic to rule out endocarditis  BUT I would not order a TEE  There was no definite vegetation on images  If he had endocarditis from this summer that was not fully treated I would expect clinical state would be much worse    5 Thoracic aneurysm.  TTE aorta measures 4.7 cm  Will need t obe followed    6.  Aortic stenosis   Mild on echo yesterday     7  DM    uncontrolled sugars    Primary team following    8CKD   STage III  Follow renal function as diureses     For questions or updates, please contact [place] Please consult [email or web address] for contact info under        Signed, Dorris Carnes, MD  01/19/2022, 9:35 AM

## 2022-01-19 NOTE — TOC Initial Note (Signed)
Transition of Care (TOC) - Initial/Assessment Note  ? ? ?Patient Details  ?Name: [name] ?MRN: [id number] ?Date of Birth: February 21, 1946 ? ?Transition of Care (TOC) CM/SW Contact:    ?Graves-Bigelow, Ocie Cornfield, RN ?Phone Number: ?01/19/2022, 1:01 PM ? ?Clinical Narrative: Risk for readmission assessment completed. Case Manager received a HF consult to see the patient. Phone interpreter used to speak with the patient. PTA patient was from home with spouse. Patient reports that his neighbors take him to PCP appointments in [place] and to pick up groceries as needed. Patient reports that he uses a CPAP at home. Case Manager will continue to follow for additional disposition needs as the patient progresses.  ? ? ?Expected Discharge Plan: Home/Self Care ?Barriers to Discharge: Continued Medical Work up ? ? ?Patient Goals and CMS Choice ?Patient states their goals for this hospitalization and ongoing recovery are:: to return home with wife. ?  ?  ? ?Expected Discharge Plan and Services ?Expected Discharge Plan: Home/Self Care ?In-house Referral: NA ?Discharge Planning Services: CM Consult ?Post Acute Care Choice: NA ?Living arrangements for the past 2 months: [place] ?                ?DME Arranged: N/A ?DME Agency: NA ?  ? ?  ? ?Prior Living Arrangements/Services ?Living arrangements for the past 2 months: [place] ?Lives with:: Spouse ?Patient language and need for interpreter reviewed:: Yes (phone intrepreter.) ?Do you feel safe going back to the place where you live?: Yes      ?Need for Family Participation in Patient Care: Yes (Comment) ?Care giver support system in place?: Yes (comment) ?Current home services: DME (Patient has CPAP at home.) ?Criminal Activity/Legal Involvement Pertinent to Current Situation/Hospitalization: No - Comment as needed ? ? ?Permission Sought/Granted ?Permission sought to share information with : Case Manager, Family Supports ?  ?Emotional  Assessment ?Appearance:: Appears stated age ?Attitude/Demeanor/Rapport: Engaged ?Affect (typically observed): Appropriate ?Orientation: : Oriented to  Time, Oriented to Place, Oriented to Self, Oriented to Situation ?Alcohol / Substance Use: Not Applicable ?Psych Involvement: No (comment) ? ?Admission diagnosis:  CAP (community acquired pneumonia) [J18.9] ?Community acquired pneumonia of right lower lobe of lung [J18.9] ?Congestive heart failure, unspecified HF chronicity, unspecified heart failure type ([place]) [I50.9] ?Patient Active Problem List  ? Diagnosis Date Noted  ? Cellulitis 01/18/2022  ? Sepsis ([place]) 01/18/2022  ? Acute respiratory failure with hypoxia ([place]) 01/18/2022  ? Acute on chronic combined systolic and diastolic CHF (congestive heart failure) ([place]) 01/18/2022  ? Lactic acidosis 01/18/2022  ? DM2 (diabetes mellitus, type 2) ([place]) 01/17/2022  ? HTN (hypertension) 01/17/2022  ? HLD (hyperlipidemia) 01/17/2022  ? Paroxysmal A-fib ([place]) 01/17/2022  ? Diffuse non-Hodgkin's lymphoma ([place]) 01/17/2022  ? CAP (community acquired pneumonia) 01/17/2022  ? Hypomagnesemia 01/17/2022  ? ?PCP:  [name], MD ?Pharmacy:   ?[facility] [address], [place] - [address] ?[place] ?[place] [address] ?Phone: [phone] Fax: [phone] ? ? ?Readmission Risk Interventions ?Readmission Risk Prevention Plan 01/19/2022  ?Transportation Screening Complete  ?PCP or Specialist Appt within 3-5 Days Complete  ?[place] or Home Care Consult Complete  ?Social Work Consult for [place] Planning/Counseling Complete  ?Palliative Care Screening Not Applicable  ?Medication Review Press photographer) Complete  ? ? ? ?

## 2022-01-20 DIAGNOSIS — J9601 Acute respiratory failure with hypoxia: Secondary | ICD-10-CM | POA: Diagnosis not present

## 2022-01-20 DIAGNOSIS — L03115 Cellulitis of right lower limb: Secondary | ICD-10-CM | POA: Diagnosis not present

## 2022-01-20 DIAGNOSIS — J189 Pneumonia, unspecified organism: Secondary | ICD-10-CM | POA: Diagnosis not present

## 2022-01-20 DIAGNOSIS — I5043 Acute on chronic combined systolic (congestive) and diastolic (congestive) heart failure: Secondary | ICD-10-CM | POA: Diagnosis not present

## 2022-01-20 DIAGNOSIS — E872 Acidosis, unspecified: Secondary | ICD-10-CM

## 2022-01-20 DIAGNOSIS — E7800 Pure hypercholesterolemia, unspecified: Secondary | ICD-10-CM

## 2022-01-20 LAB — BASIC METABOLIC PANEL
Anion gap: 10 (ref 5–15)
BUN: 33 mg/dL — ABNORMAL HIGH (ref 8–23)
CO2: 28 mmol/L (ref 22–32)
Calcium: 8.4 mg/dL — ABNORMAL LOW (ref 8.9–10.3)
Chloride: 99 mmol/L (ref 98–111)
Creatinine, Ser: 1.64 mg/dL — ABNORMAL HIGH (ref 0.61–1.24)
GFR, Estimated: 44 mL/min — ABNORMAL LOW (ref >60)
Glucose, Bld: 220 mg/dL — ABNORMAL HIGH (ref 70–99)
Potassium: 3.5 mmol/L (ref 3.5–5.1)
Sodium: 137 mmol/L (ref 135–145)

## 2022-01-20 LAB — CBC
HCT: 27.5 % — ABNORMAL LOW (ref 39.0–52.0)
Hemoglobin: 8.4 g/dL — ABNORMAL LOW (ref 13.0–17.0)
MCH: 22.7 pg — ABNORMAL LOW (ref 26.0–34.0)
MCHC: 30.5 g/dL (ref 30.0–36.0)
MCV: 74.3 fL — ABNORMAL LOW (ref 80.0–100.0)
Platelets: 195 10*3/uL (ref 150–400)
RBC: 3.7 MIL/uL — ABNORMAL LOW (ref 4.22–5.81)
RDW: 15.2 % (ref 11.5–15.5)
WBC: 5.8 10*3/uL (ref 4.0–10.5)
nRBC: 0 % (ref 0.0–0.2)

## 2022-01-20 LAB — LEGIONELLA PNEUMOPHILA SEROGP 1 UR AG: L. pneumophila Serogp 1 Ur Ag: NEGATIVE

## 2022-01-20 LAB — GLUCOSE, CAPILLARY
Glucose-Capillary: 167 mg/dL — ABNORMAL HIGH (ref 70–99)
Glucose-Capillary: 166 mg/dL — ABNORMAL HIGH (ref 70–99)
Glucose-Capillary: 215 mg/dL — ABNORMAL HIGH (ref 70–99)
Glucose-Capillary: 196 mg/dL — ABNORMAL HIGH (ref 70–99)

## 2022-01-20 NOTE — Progress Notes (Signed)
Progress Note  Patient Name: [name] Date of Encounter: 01/20/2022  [facility] Cardiologist: New  Previously [facility]    Subjective   No distress today. He is wondering when he can be discharged.  We discussed that he still has an O2 requirement and may take time  Inpatient Medications    Scheduled Meds:  amiodarone  200 mg Oral Daily   apixaban  5 mg Oral BID   atorvastatin  20 mg Oral QHS   clopidogrel  75 mg Oral Daily   finasteride  5 mg Oral QHS   furosemide  40 mg Intravenous Daily   guaiFENesin  600 mg Oral BID   insulin aspart  0-15 Units Subcutaneous TID WC   insulin aspart  0-5 Units Subcutaneous QHS   insulin aspart  3 Units Subcutaneous TID WC   insulin glargine-yfgn  30 Units Subcutaneous Q2200   ipratropium-albuterol  3 mL Nebulization BID   isosorbide mononitrate  30 mg Oral Daily   metoprolol tartrate  50 mg Oral BID   pantoprazole  40 mg Oral BID AC   sodium chloride flush  3 mL Intravenous Q12H   tamsulosin  0.4 mg Oral QPC supper   terazosin  2 mg Oral QHS   Continuous Infusions:  sodium chloride     cefTRIAXone (ROCEPHIN)  IV 2 g (01/19/22 2212)   doxycycline (VIBRAMYCIN) IV 100 mg (01/20/22 0541)   PRN Meds: sodium chloride, acetaminophen **OR** acetaminophen, alum & mag hydroxide-simeth, HYDROcodone-acetaminophen, ipratropium-albuterol, ondansetron (ZOFRAN) IV, polyethylene glycol, sodium chloride flush   Vital Signs    Vitals:   01/19/22 2012 01/20/22 0700 01/20/22 0714 01/20/22 0839  BP: 135/70  (!) 124/59   Pulse: 73  71   Resp: 18  20   Temp: 98.2 F (36.8 C)  98.5 F (36.9 C)   TempSrc: Oral  Oral   SpO2: 96%  99% 97%  Weight:  111.4 kg    Height:        Intake/Output Summary (Last 24 hours) at 01/20/2022 0941 Last data filed at 01/20/2022 0645 Gross per 24 hour  Intake 990 ml  Output 4050 ml  Net -3060 ml   Last 3 Weights 01/20/2022 01/19/2022 01/18/2022  Weight (lbs) 245 lb 8 oz 256 lb 6.3 oz 267 lb 13.7 oz  Weight (kg)  111.358 kg 116.3 kg 121.5 kg      Telemetry    NSR  - Personally Reviewed  ECG     No new - Personally Reviewed  Physical Exam   Physical Exam Gen: well appearing, no distress, on O2 Neuro: alert and oriented CV: r,r,r  3/6 SEM RUSB, NO sig JVD Vasc: 2+ radial pulses Pulm: mild wob, decreased BS in the bases w/ rales/exp wheezes Abd: non distended Ext: No LE edema Skin: warm and well perfused Psych: normal mood   Labs    High Sensitivity Troponin:   Recent Labs  Lab 01/17/22 2154 01/17/22 2349 01/18/22 0204 01/18/22 0445 01/18/22 0658  TROPONINIHS 28* 69* 99* 130* 132*     Chemistry Recent Labs  Lab 01/17/22 2154 01/17/22 2202 01/18/22 0204 01/19/22 0227 01/20/22 0215  NA 137   < > 137 140 137  K 4.8   < > 4.1 4.0 3.5  CL 103   < > 102 104 99  CO2 19*  --  '22 27 28  '$ GLUCOSE 308*   < > 350* 165* 220*  BUN 23   < > 25* 35* 33*  CREATININE  1.45*   < > 1.60* 1.50* 1.64*  CALCIUM 8.8*  --  8.4* 8.6* 8.4*  MG 1.3*  --  1.6* 2.1  --   PROT 5.5*  --  5.1* 4.7*  --   ALBUMIN 3.6  --  3.2* 2.9*  --   AST 36  --  29 22  --   ALT 36  --  32 30  --   ALKPHOS 97  --  77 67  --   BILITOT 0.5  --  0.4 0.4  --   GFRNONAA 51*  --  45* 49* 44*  ANIONGAP 15  --  '13 9 10   '$ < > = values in this interval not displayed.    Lipids No results for input(s): CHOL, TRIG, HDL, LABVLDL, LDLCALC, CHOLHDL in the last 168 hours.  Hematology Recent Labs  Lab 01/18/22 0204 01/19/22 0227 01/20/22 0215  WBC 10.7* 9.5 5.8  RBC 3.98* 3.58* 3.70*  HGB 9.3* 8.3* 8.4*  HCT 29.1* 26.0* 27.5*  MCV 73.1* 72.6* 74.3*  MCH 23.4* 23.2* 22.7*  MCHC 32.0 31.9 30.5  RDW 15.0 15.1 15.2  PLT 173 196 195   Thyroid  Recent Labs  Lab 01/18/22 0204  TSH 1.854    BNP Recent Labs  Lab 01/17/22 2154  BNP 220.4*    DDimer No results for input(s): DDIMER in the last 168 hours.   Radiology    DG CHEST PORT 1 VIEW  Result Date: 01/19/2022 CLINICAL DATA:  Dyspnea EXAM: PORTABLE CHEST  1 VIEW COMPARISON:  01/17/2022 FINDINGS: Midline trachea. Cardiomegaly accentuated by AP portable technique. Atherosclerosis in the transverse aorta. No pleural effusion or pneumothorax. Bibasilar airspace disease is felt to be slightly improved. The upper lungs are clear. IMPRESSION: Improvement in bibasilar airspace disease/pneumonia. Cardiomegaly. Aortic Atherosclerosis (ICD10-I70.0). Electronically Signed   By: Abigail Miyamoto M.D.   On: 01/19/2022 14:28   ECHOCARDIOGRAM COMPLETE  Result Date: 01/18/2022    ECHOCARDIOGRAM REPORT   Patient Name:   [name] Date of Exam: 01/18/2022 Medical Rec #:  [id number]        Height:       72.0 in Accession #:    [id number]       Weight:       267.9 lb Date of Birth:  February 12, 1946         BSA:          2.412 m Patient Age:    76 years         BP:           122/62 mmHg Patient Gender: M                HR:           77 bpm. Exam Location:  Inpatient Procedure: 2D Echo and Intracardiac Opacification Agent Indications:    Acute systolic CHF  History:        Patient has no prior history of Echocardiogram examinations.  Sonographer:    [name] Referring Phys: [place]  1. Left ventricular ejection fraction, by estimation, is 55 to 60%. The left ventricle has normal function. Left ventricular endocardial border not optimally defined to evaluate regional wall motion. Left ventricular diastolic parameters are consistent with Grade I diastolic dysfunction (impaired relaxation).  2. Right ventricular systolic function is normal. The right ventricular size is normal. Tricuspid regurgitation signal is inadequate for assessing PA pressure.  3. Left atrial size was mild to moderately dilated.  4. Right atrial size was mild to moderately dilated.  5. The mitral valve is normal in structure. No evidence of mitral valve regurgitation. No evidence of mitral stenosis.  6. The aortic valve is tricuspid. There is moderate calcification of the aortic valve.  Aortic valve regurgitation is not visualized. Mild aortic valve stenosis. Aortic valve mean gradient measures 13.0 mmHg.  7. Aortic dilatation noted. 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  8. The inferior vena cava is normal in size with greater than 50% respiratory variability, suggesting right atrial pressure of 3 mmHg.  9. Technically difficult study with poor acoustic windows. FINDINGS  Left Ventricle: Left ventricular ejection fraction, by estimation, is 55 to 60%. The left ventricle has normal function. Left ventricular endocardial border not optimally defined to evaluate regional wall motion. Definity contrast agent was given IV to delineate the left ventricular endocardial borders. The left ventricular internal cavity size was normal in size. There is no left ventricular hypertrophy. Left ventricular diastolic parameters are consistent with Grade I diastolic dysfunction (impaired relaxation). Right Ventricle: The right ventricular size is normal. No increase in right ventricular wall thickness. Right ventricular systolic function is normal. Tricuspid regurgitation signal is inadequate for assessing PA pressure. Left Atrium: Left atrial size was mild to moderately dilated. Right Atrium: Right atrial size was mild to moderately dilated. Pericardium: There is no evidence of pericardial effusion. Mitral Valve: The mitral valve is normal in structure. Mild mitral annular calcification. No evidence of mitral valve regurgitation. No evidence of mitral valve stenosis. Tricuspid Valve: The tricuspid valve is normal in structure. Tricuspid valve regurgitation is trivial. Aortic Valve: The aortic valve is tricuspid. There is moderate calcification of the aortic valve. Aortic valve regurgitation is not visualized. Mild aortic stenosis is present. Aortic valve mean gradient measures 13.0 mmHg. Aortic valve peak gradient measures 17.1 mmHg. Aortic valve area, by VTI measures 2.10 cm.  Pulmonic Valve: The pulmonic valve was normal in structure. Pulmonic valve regurgitation is not visualized. Aorta: The aortic root is normal in size and structure and aortic dilatation noted. 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 Venous: The inferior vena cava is normal in size with greater than 50% respiratory variability, suggesting right atrial pressure of 3 mmHg. IAS/Shunts: No atrial level shunt detected by color flow Doppler.  LEFT VENTRICLE PLAX 2D LVIDd:         5.73 cm   Diastology LVIDs:         4.02 cm   LV e' medial:    6.53 cm/s LV PW:         1.06 cm   LV E/e' medial:  16.5 LV IVS:        1.06 cm   LV e' lateral:   6.96 cm/s LVOT diam:     2.30 cm   LV E/e' lateral: 15.5 LV SV:         99 LV SV Index:   41 LVOT Area:     4.15 cm  RIGHT VENTRICLE RV Basal diam:  4.42 cm RV Mid diam:    3.54 cm RV S prime:     18.20 cm/s TAPSE (M-mode): 2.4 cm LEFT ATRIUM             Index        RIGHT ATRIUM           Index LA diam:        5.00 cm 2.07 cm/m  RA Area:     27.20 cm LA Vol (A2C):   83.7 ml 34.70 ml/m  RA Volume:   80.10 ml  33.21 ml/m LA Vol (A4C):   97.2 ml 40.29 ml/m LA Biplane Vol: 90.4 ml 37.48 ml/m  AORTIC VALVE AV Area (Vmax):    1.97 cm AV Area (Vmean):   2.01 cm AV Area (VTI):     2.10 cm AV Vmax:           207.00 cm/s AV Vmean:          146.000 cm/s AV VTI:            0.472 m AV Peak Grad:      17.1 mmHg AV Mean Grad:      13.0 mmHg LVOT Vmax:         98.25 cm/s LVOT Vmean:        70.750 cm/s LVOT VTI:          0.238 m LVOT/AV VTI ratio: 0.50  AORTA Ao Root diam: 3.40 cm Ao Asc diam:  4.70 cm MITRAL VALVE MV Area (PHT): 2.95 cm     SHUNTS MV Decel Time: 257 msec     Systemic VTI:  0.24 m MV E velocity: 108.00 cm/s  Systemic Diam: 2.30 cm MV A velocity: 104.00 cm/s MV E/A ratio:  1.04 Dalton McleanMD Electronically signed by Franki Monte Signature Date/Time: 01/18/2022/4:58:31 PM    Final    VAS [place] LOWER EXTREMITY VENOUS (DVT)  Result Date: 01/18/2022   Lower Venous DVT Study Patient Name:  [name]  Date of Exam:   01/18/2022 Medical Rec #: [id number]         Accession #:    [id number] Date of Birth: [birth year]          Patient Gender: M Patient Age:   43 years [facility] Procedure:      VAS [place] LOWER EXTREMITY VENOUS (DVT) Referring Phys: Nyoka Lint DOUTOVA --------------------------------------------------------------------------------  Indications: Edema Rt>Lt.  Risk Factors: CHF, non-Hodgkin lymphoma. Anticoagulation: Eliquis. Comparison Study: No prior studies. Performing Technologist: Darlin Coco RDMS, RVT  Examination Guidelines: A complete evaluation includes B-mode imaging, spectral Doppler, color Doppler, and power Doppler as needed of all accessible portions of each vessel. Bilateral testing is considered an integral part of a complete examination. Limited examinations for reoccurring indications may be performed as noted. The reflux portion of the exam is performed with the patient in reverse Trendelenburg.  +---------+---------------+---------+-----------+----------+--------------+  RIGHT     Compressibility Phasicity Spontaneity Properties Thrombus Aging  +---------+---------------+---------+-----------+----------+--------------+  CFV       Full            Yes       Yes                                    +---------+---------------+---------+-----------+----------+--------------+  SFJ       Full                                                             +---------+---------------+---------+-----------+----------+--------------+  FV Prox   Full                                                             +---------+---------------+---------+-----------+----------+--------------+  FV Mid    Full                                                             +---------+---------------+---------+-----------+----------+--------------+  FV Distal Full                                                              +---------+---------------+---------+-----------+----------+--------------+  PFV       Full                                                             +---------+---------------+---------+-----------+----------+--------------+  POP       Full            Yes       Yes                                    +---------+---------------+---------+-----------+----------+--------------+  PTV       Full                                                             +---------+---------------+---------+-----------+----------+--------------+  PERO      Full                                                             +---------+---------------+---------+-----------+----------+--------------+  Gastroc   Full                                                             +---------+---------------+---------+-----------+----------+--------------+   +----+---------------+---------+-----------+----------+--------------+  LEFT Compressibility Phasicity Spontaneity Properties Thrombus Aging  +----+---------------+---------+-----------+----------+--------------+  CFV  Full            Yes       Yes                                    +----+---------------+---------+-----------+----------+--------------+    Summary: RIGHT: - There is no evidence of deep vein thrombosis in the lower extremity.  - No cystic structure found in the popliteal fossa.  LEFT: - No evidence of common femoral vein obstruction.  *See table(s) above for measurements and observations. Electronically signed by Servando Snare MD on 01/18/2022 at 5:12:06  PM.    Final     Cardiac Studies   Echo   01/18/22  Left ventricular ejection fraction, by estimation, is 55 to 60%. The left ventricle has normal function. Left ventricular endocardial border not optimally defined to evaluate regional wall motion. Left ventricular diastolic parameters are consistent with Grade I diastolic dysfunction (impaired relaxation). 1. Right ventricular systolic function is normal. The right  ventricular size is normal. Tricuspid regurgitation signal is inadequate for assessing PA pressure. 2. 3. Left atrial size was mild to moderately dilated. 4. Right atrial size was mild to moderately dilated. The mitral valve is normal in structure. No evidence of mitral valve regurgitation. No evidence of mitral stenosis. 5. The aortic valve is tricuspid. There is moderate calcification of the aortic valve. Aortic valve regurgitation is not visualized. Mild aortic valve stenosis. Aortic valve mean gradient measures 13.0 mmHg. 6. Aortic dilatation noted. 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 7. The inferior vena cava is normal in size with greater than 50% respiratory variability, suggesting right atrial pressure of 3 mmHg. 8. 9. Technically difficult study with poor acoustic windows.   [facility]     Caths   Dec 2021, Jan 2022 "Cardiac cath from - Oct 17, 2020 revealed severe coronary artery disease.  Prox Cx lesion is 70% stenosed.  1st Mrg lesion is 70% stenosed.  Mid LAD lesion is 70% stenosed.  1st Diag lesion is 99% stenosed.  Dist RCA lesion is 80% stenosed.  Prox RCA to Mid RCA lesion is 10% stenosed.  Dr. Maisie Fus planned another cardiac cath for January 7 to do FFR on proximal LAD and if not significant perform PCI of RCA. If FFR will review severe proximal LAD disease, patient would have multivessel cardiac bypass done of LAD, left circumflex and RCA.  Pt had cardiac cath on Nov 21, 2020 and FFR did not reveal severe stenosis of LAD with FFR 0.86-0.92 and by visual estimation 30 to 40% luminal narrowing. Patient had PCI of distal RCA with DES  Patient Profile     76 y.o. 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 Has pulmonary edema and O2 requirement. No LE edema. Net negative 3L  Echo in July 2022 LVEF 50 to 55%   Repeat echo LVEF and RVEF normal     -Continue lasix  40 mg IV for today   ; once he is weaned from O2 ( at least to 2L then maybe home O2) and can ambulate ; recommend transition to oral lasix 20 mg daily. Will work on FU appt Strict I/O    Follow renal function  On metoprolol 50 bid and imdur  SOme of edema may be due to amlodipine use as outpt   This has been stopped    2  CAD   pt with hx of CAD    s/p PTCA/DES to RCA    Mod dz elsewhere  Very mild elevations of trop  28, 69, 99, 130, 132    He denies CP   Echo with no wall motion abnormalities   Keep on current meds of Plavix, statin, imdur  3  PAF   CHADSVasc score  is 5   Keep on Eliquis and amiodarone REmains in SR   4.  Possible endocarditis    June 2022    TEE with vegetation   Treated with IV ABX I have reviewed echo from this admit   Difficult windows   The MV is thickened but there is nothing to sugg a vegetation. AV is thickened, calcified   No definite vegation   The current TTE would not be suffic to rule out endocarditis  BUT I would not order a TEE  There was no definite vegetation on images  If he had endocarditis from this summer that was not fully treated I would expect clinical state would be much worse    5 Thoracic aneurysm.  TTE aorta measures 4.7 cm  Will need t obe followed    6.  Aortic stenosis   Mild on echo yesterday     7  DM    uncontrolled sugars    Primary team following    8CKD   STage III  Follow renal function as diureses     For questions or updates, please contact [place] Please consult [email or web address] for contact info under        Signed, Janina Mayo, MD  01/20/2022, 9:41 AM

## 2022-01-20 NOTE — Progress Notes (Signed)
SATURATION QUALIFICATIONS: (This note is used to comply with regulatory documentation for home oxygen) ? ?Patient Saturations on Room Air at Rest = 98 % ? ?Patient Saturations on Room Air while Ambulating = 83 % ? ?Patient Saturations on 2 Liters of oxygen while Ambulating = 96% ? ?Please briefly explain why patient needs home oxygen: ?

## 2022-01-20 NOTE — Progress Notes (Signed)
PROGRESS NOTE    [name]  ZRA:[id number] DOB: 03-25-1946 DOA: 01/17/2022 PCP: [name], MD   Brief Narrative:  This is a 76 yo M with history of DM2, chronic kidney disease, diffuse non-Hodgkin's lymphoma, hypertension, OSA, coronary artery disease with history of PCI with DES to RCA January 2022 on Plavix, PAF on Eliquis and amiodarone, NSVT, who comes into the hospital with severe shortness of breath and worsening of his leg swelling.  Wife also felt that he had a fever.  He required BiPAP on admission.  Of note, he was admitted last July with concerns for endocarditis and treated with 6 weeks of antibiotics via PICC line.  Chest x-ray this admission showed bilateral lower lobe airspace disease worrisome for multifocal pneumonia or edema, and stable cardiomegaly.  He was given Lasix, placed on IV antibiotics and admitted to the hospital.   Subjective / 24h Interval events: No acute issues or events overnight, denies nausea vomiting diarrhea constipation headache fevers chills or chest pain  Assessment & Plan:   Principal Problem:   CAP (community acquired pneumonia) Active Problems:   DM2 (diabetes mellitus, type 2) ([place])   HTN (hypertension)   HLD (hyperlipidemia)   Paroxysmal A-fib (HCC)   Diffuse non-Hodgkin's lymphoma (HCC)   Hypomagnesemia   Cellulitis   Sepsis ([place])   Acute respiratory failure with hypoxia (HCC)   Acute on chronic combined systolic and diastolic CHF (congestive heart failure) (HCC)   Lactic acidosis   Acute hypoxic respiratory failure, likely multifactorial in the setting of acute on chronic combined CHF, possible community-acquired pneumonia, hemoptysis - Continue to wean oxygen as tolerated - Remains hypoxic today with ambulation - Continue Lasix -volume overloaded but improving -Continue antibiotics x5 days with ceftriaxone, doxycycline  Acute on chronic combined CHF - EF 55 to 60%, normal LVEF, grade 1 diastolic dysfunction.   -  Cardiology following -Lasix ongoing  Troponin elevation - Cardiology following, appreciate insight and recommendations   AKI on CKD 3B -In the setting of diuretics/heart failure -Follow repeat labs   Diffuse large B-cell lymphoma - follows with oncology as an outpatient.  Most recent evaluation December 2020 with excellent response to treatment   Paroxysmal A-fib-continue amiodarone, Eliquis   Coronary artery disease-with history of stenting, continue Plavix   Hyperlipidemia-continue statin   BPH-continue Flomax   Anemia of chronic disease-hemoglobin overall stable, 8.3 this morning.  Monitor  Type 2 diabetes mellitus, poorly controlled, with hyperglycemia - Increase long-acting, add mealtime insulin given elevated CBGs this morning.  - Hemoglobin A1c 9.0  DVT prophylaxis: Apixaban Code Status: Full Family Communication: At bedside  Status is: Inpatient  Dispo: The patient is from: Home              Anticipated d/c is to: Home              Anticipated d/c date is: 24 to 48 hours pending clinical course              Patient currently not medically stable for discharge  Consultants:  Cardiology  Procedures:  None  Antimicrobials:  Ceftriaxone, doxycycline x5 days  Subjective: No acute issues or events overnight, requesting discharge home which we discussed would be dependent on his respiratory status moving forward  Objective: Vitals:   01/19/22 0955 01/19/22 2012 01/20/22 0700 01/20/22 0714  BP:  135/70  (!) 124/59  Pulse: 74 73  71  Resp:  18  20  Temp:  98.2 F (36.8 C)  98.5 F (  36.9 C)  TempSrc:  Oral  Oral  SpO2:  96%  99%  Weight:   111.4 kg   Height:        Intake/Output Summary (Last 24 hours) at 01/20/2022 0826 Last data filed at 01/20/2022 0645 Gross per 24 hour  Intake 990 ml  Output 4050 ml  Net -3060 ml   Filed Weights   01/18/22 0500 01/19/22 0500 01/20/22 0700  Weight: 121.5 kg 116.3 kg 111.4 kg    Examination:  General exam:  Appears calm and comfortable  Respiratory system: Clear to auscultation. Respiratory effort normal. Cardiovascular system: S1 & S2 heard, RRR. No JVD, murmurs, rubs, gallops or clicks. No pedal edema. Gastrointestinal system: Abdomen is nondistended, soft and nontender. No organomegaly or masses felt. Normal bowel sounds heard. Central nervous system: Alert and oriented. No focal neurological deficits. Extremities: Symmetric 5 x 5 power. Skin: No rashes, lesions or ulcers Psychiatry: Judgement and insight appear normal. Mood & affect appropriate.     Data Reviewed: I have personally reviewed following labs and imaging studies  CBC: Recent Labs  Lab 01/17/22 2154 01/17/22 2202 01/17/22 2217 01/18/22 0204 01/19/22 0227 01/20/22 0215  WBC 14.4*  --   --  10.7* 9.5 5.8  NEUTROABS 10.5*  --   --  9.7*  --   --   HGB 10.8* 11.2* 10.5* 9.3* 8.3* 8.4*  HCT 34.6* 33.0* 31.0* 29.1* 26.0* 27.5*  MCV 74.4*  --   --  73.1* 72.6* 74.3*  PLT 259  --   --  173 196 923   Basic Metabolic Panel: Recent Labs  Lab 01/17/22 2154 01/17/22 2202 01/17/22 2217 01/18/22 0204 01/19/22 0227 01/20/22 0215  NA 137 137 137 137 140 137  K 4.8 4.7 4.4 4.1 4.0 3.5  CL 103 105  --  102 104 99  CO2 19*  --   --  '22 27 28  '$ GLUCOSE 308* 302*  --  350* 165* 220*  BUN 23 26*  --  25* 35* 33*  CREATININE 1.45* 1.30*  --  1.60* 1.50* 1.64*  CALCIUM 8.8*  --   --  8.4* 8.6* 8.4*  MG 1.3*  --   --  1.6* 2.1  --   PHOS 3.6  --   --  3.7  --   --    GFR: Estimated Creatinine Clearance: 50.9 mL/min (A) (by C-G formula based on SCr of 1.64 mg/dL (H)). Liver Function Tests: Recent Labs  Lab 01/17/22 2154 01/18/22 0204 01/19/22 0227  AST 36 29 22  ALT 36 32 30  ALKPHOS 97 77 67  BILITOT 0.5 0.4 0.4  PROT 5.5* 5.1* 4.7*  ALBUMIN 3.6 3.2* 2.9*   No results for input(s): LIPASE, AMYLASE in the last 168 hours. No results for input(s): AMMONIA in the last 168 hours. Coagulation Profile: No results for  input(s): INR, PROTIME in the last 168 hours. Cardiac Enzymes: No results for input(s): CKTOTAL, CKMB, CKMBINDEX, TROPONINI in the last 168 hours. BNP (last 3 results) No results for input(s): PROBNP in the last 8760 hours. HbA1C: Recent Labs    01/18/22 0021  HGBA1C 9.0*   CBG: Recent Labs  Lab 01/19/22 0808 01/19/22 1210 01/19/22 1614 01/19/22 2021 01/20/22 0747  GLUCAP 174* 190* 180* 195* 167*   Lipid Profile: No results for input(s): CHOL, HDL, LDLCALC, TRIG, CHOLHDL, LDLDIRECT in the last 72 hours. Thyroid Function Tests: Recent Labs    01/18/22 0204  TSH 1.854   Anemia Panel: No results  for input(s): VITAMINB12, FOLATE, FERRITIN, TIBC, IRON, RETICCTPCT in the last 72 hours. Sepsis Labs: Recent Labs  Lab 01/17/22 2154 01/17/22 2214 01/18/22 0014 01/18/22 0204 01/18/22 0445  PROCALCITON <0.10  --   --   --   --   LATICACIDVEN  --  2.7* 2.9* 2.8* 2.1*    Recent Results (from the past 240 hour(s))  Resp Panel by RT-PCR (Flu A&B, Covid) Nasopharyngeal Swab     Status: None   Collection Time: 01/17/22 10:02 PM   Specimen: Nasopharyngeal Swab; Nasopharyngeal(NP) swabs in vial transport medium  Result Value Ref Range Status   SARS Coronavirus 2 by RT PCR NEGATIVE NEGATIVE Final    Comment: (NOTE) SARS-CoV-2 target nucleic acids are NOT DETECTED.  The SARS-CoV-2 RNA is generally detectable in upper respiratory specimens during the acute phase of infection. The lowest concentration of SARS-CoV-2 viral copies this assay can detect is 138 copies/mL. A negative result does not preclude SARS-Cov-2 infection and should not be used as the sole basis for treatment or other patient management decisions. A negative result may occur with  improper specimen collection/handling, submission of specimen other than nasopharyngeal swab, presence of viral mutation(s) within the areas targeted by this assay, and inadequate number of viral copies(<138 copies/mL). A negative  result must be combined with clinical observations, patient history, and epidemiological information. The expected result is Negative.  Fact Sheet for Patients:  EntrepreneurPulse.com.au  Fact Sheet for Healthcare Providers:  IncredibleEmployment.be  This test is no t yet approved or cleared by the [place] FDA and  has been authorized for detection and/or diagnosis of SARS-CoV-2 by FDA under an Emergency Use Authorization (EUA). This EUA will remain  in effect (meaning this test can be used) for the duration of the COVID-19 declaration under Section 564(b)(1) of the Act, 21 U.S.C.section 360bbb-3(b)(1), unless the authorization is terminated  or revoked sooner.       Influenza A by PCR NEGATIVE NEGATIVE Final   Influenza B by PCR NEGATIVE NEGATIVE Final    Comment: (NOTE) The Xpert Xpress SARS-CoV-2/FLU/RSV plus assay is intended as an aid in the diagnosis of influenza from Nasopharyngeal swab specimens and should not be used as a sole basis for treatment. Nasal washings and aspirates are unacceptable for Xpert Xpress SARS-CoV-2/FLU/RSV testing.  Fact Sheet for Patients: EntrepreneurPulse.com.au  Fact Sheet for Healthcare Providers: IncredibleEmployment.be  This test is not yet approved or cleared by the [place] FDA and has been authorized for detection and/or diagnosis of SARS-CoV-2 by FDA under an Emergency Use Authorization (EUA). This EUA will remain in effect (meaning this test can be used) for the duration of the COVID-19 declaration under Section 564(b)(1) of the Act, 21 U.S.C. section 360bbb-3(b)(1), unless the authorization is terminated or revoked.  Performed at [facility], [place] [address] [place], [place] [address]   Respiratory (~20 pathogens) panel by PCR     Status: None   Collection Time: 01/17/22 10:02 PM   Specimen: Nasopharyngeal Swab; Respiratory  Result Value  Ref Range Status   Adenovirus NOT DETECTED NOT DETECTED Final   Coronavirus 229E NOT DETECTED NOT DETECTED Final    Comment: (NOTE) The Coronavirus on the Respiratory Panel, DOES NOT test for the novel  Coronavirus (2019 nCoV)    Coronavirus HKU1 NOT DETECTED NOT DETECTED Final   Coronavirus NL63 NOT DETECTED NOT DETECTED Final   Coronavirus OC43 NOT DETECTED NOT DETECTED Final   Metapneumovirus NOT DETECTED NOT DETECTED Final   Rhinovirus / Enterovirus  NOT DETECTED NOT DETECTED Final   Influenza A NOT DETECTED NOT DETECTED Final   Influenza B NOT DETECTED NOT DETECTED Final   Parainfluenza Virus 1 NOT DETECTED NOT DETECTED Final   Parainfluenza Virus 2 NOT DETECTED NOT DETECTED Final   Parainfluenza Virus 3 NOT DETECTED NOT DETECTED Final   Parainfluenza Virus 4 NOT DETECTED NOT DETECTED Final   Respiratory Syncytial Virus NOT DETECTED NOT DETECTED Final   Bordetella pertussis NOT DETECTED NOT DETECTED Final   Bordetella Parapertussis NOT DETECTED NOT DETECTED Final   Chlamydophila pneumoniae NOT DETECTED NOT DETECTED Final   Mycoplasma pneumoniae NOT DETECTED NOT DETECTED Final    Comment: Performed at [facility], [place] [address] [place], [place] [address]  Urine Culture     Status: Abnormal   Collection Time: 01/17/22 10:15 PM   Specimen: Urine, Clean Catch  Result Value Ref Range Status   Specimen Description URINE, CLEAN CATCH  Final   Special Requests   Final    NONE Performed at [facility], [place] [address], [place], [place] [address]    Culture MULTIPLE SPECIES PRESENT, SUGGEST RECOLLECTION (A)  Final   Report Status 01/19/2022 FINAL  Final  Blood culture (routine x 2)     Status: None (Preliminary result)   Collection Time: 01/17/22 10:39 PM   Specimen: BLOOD RIGHT FOREARM  Result Value Ref Range Status   Specimen Description BLOOD RIGHT FOREARM  Final   Special Requests   Final    BOTTLES DRAWN AEROBIC AND ANAEROBIC Blood Culture results may not be  optimal due to an inadequate volume of blood received in culture bottles   Culture   Final    NO GROWTH 2 DAYS Performed at [facility], [place] [address], [place], [place] [address]    Report Status PENDING  Incomplete  Blood culture (routine x 2)     Status: None (Preliminary result)   Collection Time: 01/17/22 10:41 PM   Specimen: BLOOD RIGHT HAND  Result Value Ref Range Status   Specimen Description BLOOD RIGHT HAND  Final   Special Requests   Final    BOTTLES DRAWN AEROBIC AND ANAEROBIC Blood Culture results may not be optimal due to an inadequate volume of blood received in culture bottles   Culture   Final    NO GROWTH 2 DAYS Performed at [facility], [place] [address] [place], [place] [address]    Report Status PENDING  Incomplete         Radiology Studies: DG CHEST PORT 1 VIEW  Result Date: 01/19/2022 CLINICAL DATA:  Dyspnea EXAM: PORTABLE CHEST 1 VIEW COMPARISON:  01/17/2022 FINDINGS: Midline trachea. Cardiomegaly accentuated by AP portable technique. Atherosclerosis in the transverse aorta. No pleural effusion or pneumothorax. Bibasilar airspace disease is felt to be slightly improved. The upper lungs are clear. IMPRESSION: Improvement in bibasilar airspace disease/pneumonia. Cardiomegaly. Aortic Atherosclerosis (ICD10-I70.0). Electronically Signed   By: Abigail Miyamoto M.D.   On: 01/19/2022 14:28   ECHOCARDIOGRAM COMPLETE  Result Date: 01/18/2022    ECHOCARDIOGRAM REPORT   Patient Name:   [name] Date of Exam: 01/18/2022 Medical Rec #:  [id number]        Height:       72.0 in Accession #:    [id number]       Weight:       267.9 lb Date of Birth:  03-Aug-1946         BSA:  2.412 m Patient Age:    53 years         BP:           122/62 mmHg Patient Gender: M                HR:           77 bpm. Exam Location:  Inpatient Procedure: 2D Echo and Intracardiac Opacification Agent Indications:    Acute systolic CHF  History:        Patient has no prior history of  Echocardiogram examinations.  Sonographer:    [name] Referring Phys: [place]  1. Left ventricular ejection fraction, by estimation, is 55 to 60%. The left ventricle has normal function. Left ventricular endocardial border not optimally defined to evaluate regional wall motion. Left ventricular diastolic parameters are consistent with Grade I diastolic dysfunction (impaired relaxation).  2. Right ventricular systolic function is normal. The right ventricular size is normal. Tricuspid regurgitation signal is inadequate for assessing PA pressure.  3. Left atrial size was mild to moderately dilated.  4. Right atrial size was mild to moderately dilated.  5. The mitral valve is normal in structure. No evidence of mitral valve regurgitation. No evidence of mitral stenosis.  6. The aortic valve is tricuspid. There is moderate calcification of the aortic valve. Aortic valve regurgitation is not visualized. Mild aortic valve stenosis. Aortic valve mean gradient measures 13.0 mmHg.  7. Aortic dilatation noted. 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  8. The inferior vena cava is normal in size with greater than 50% respiratory variability, suggesting right atrial pressure of 3 mmHg.  9. Technically difficult study with poor acoustic windows. FINDINGS  Left Ventricle: Left ventricular ejection fraction, by estimation, is 55 to 60%. The left ventricle has normal function. Left ventricular endocardial border not optimally defined to evaluate regional wall motion. Definity contrast agent was given IV to delineate the left ventricular endocardial borders. The left ventricular internal cavity size was normal in size. There is no left ventricular hypertrophy. Left ventricular diastolic parameters are consistent with Grade I diastolic dysfunction (impaired relaxation). Right Ventricle: The right ventricular size is normal. No increase in right ventricular wall thickness.  Right ventricular systolic function is normal. Tricuspid regurgitation signal is inadequate for assessing PA pressure. Left Atrium: Left atrial size was mild to moderately dilated. Right Atrium: Right atrial size was mild to moderately dilated. Pericardium: There is no evidence of pericardial effusion. Mitral Valve: The mitral valve is normal in structure. Mild mitral annular calcification. No evidence of mitral valve regurgitation. No evidence of mitral valve stenosis. Tricuspid Valve: The tricuspid valve is normal in structure. Tricuspid valve regurgitation is trivial. Aortic Valve: The aortic valve is tricuspid. There is moderate calcification of the aortic valve. Aortic valve regurgitation is not visualized. Mild aortic stenosis is present. Aortic valve mean gradient measures 13.0 mmHg. Aortic valve peak gradient measures 17.1 mmHg. Aortic valve area, by VTI measures 2.10 cm. Pulmonic Valve: The pulmonic valve was normal in structure. Pulmonic valve regurgitation is not visualized. Aorta: The aortic root is normal in size and structure and aortic dilatation noted. 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 Venous: The inferior vena cava is normal in size with greater than 50% respiratory variability, suggesting right atrial pressure of 3 mmHg. IAS/Shunts: No atrial level shunt detected by color flow Doppler.  LEFT VENTRICLE PLAX 2D LVIDd:  5.73 cm   Diastology LVIDs:         4.02 cm   LV e' medial:    6.53 cm/s LV PW:         1.06 cm   LV E/e' medial:  16.5 LV IVS:        1.06 cm   LV e' lateral:   6.96 cm/s LVOT diam:     2.30 cm   LV E/e' lateral: 15.5 LV SV:         99 LV SV Index:   41 LVOT Area:     4.15 cm  RIGHT VENTRICLE RV Basal diam:  4.42 cm RV Mid diam:    3.54 cm RV S prime:     18.20 cm/s TAPSE (M-mode): 2.4 cm LEFT ATRIUM             Index        RIGHT ATRIUM           Index LA diam:        5.00 cm 2.07 cm/m   RA Area:     27.20 cm LA Vol (A2C):   83.7 ml 34.70  ml/m  RA Volume:   80.10 ml  33.21 ml/m LA Vol (A4C):   97.2 ml 40.29 ml/m LA Biplane Vol: 90.4 ml 37.48 ml/m  AORTIC VALVE AV Area (Vmax):    1.97 cm AV Area (Vmean):   2.01 cm AV Area (VTI):     2.10 cm AV Vmax:           207.00 cm/s AV Vmean:          146.000 cm/s AV VTI:            0.472 m AV Peak Grad:      17.1 mmHg AV Mean Grad:      13.0 mmHg LVOT Vmax:         98.25 cm/s LVOT Vmean:        70.750 cm/s LVOT VTI:          0.238 m LVOT/AV VTI ratio: 0.50  AORTA Ao Root diam: 3.40 cm Ao Asc diam:  4.70 cm MITRAL VALVE MV Area (PHT): 2.95 cm     SHUNTS MV Decel Time: 257 msec     Systemic VTI:  0.24 m MV E velocity: 108.00 cm/s  Systemic Diam: 2.30 cm MV A velocity: 104.00 cm/s MV E/A ratio:  1.04 Dalton McleanMD Electronically signed by Franki Monte Signature Date/Time: 01/18/2022/4:58:31 PM    Final    VAS [place] LOWER EXTREMITY VENOUS (DVT)  Result Date: 01/18/2022  Lower Venous DVT Study Patient Name:  [name]  Date of Exam:   01/18/2022 Medical Rec #: [id number]         Accession #:    [id number] Date of Birth: October 13, 1946          Patient Gender: M Patient Age:   23 years Exam Location:  [facility] Procedure:      VAS [place] LOWER EXTREMITY VENOUS (DVT) Referring Phys: Nyoka Lint DOUTOVA --------------------------------------------------------------------------------  Indications: Edema Rt>Lt.  Risk Factors: CHF, non-Hodgkin lymphoma. Anticoagulation: Eliquis. Comparison Study: No prior studies. Performing Technologist: Darlin Coco RDMS, RVT  Examination Guidelines: A complete evaluation includes B-mode imaging, spectral Doppler, color Doppler, and power Doppler as needed of all accessible portions of each vessel. Bilateral testing is considered an integral part of a complete examination. Limited examinations for reoccurring indications may be performed as noted. The reflux portion of the exam  is performed with the patient in reverse Trendelenburg.   +---------+---------------+---------+-----------+----------+--------------+  RIGHT     Compressibility Phasicity Spontaneity Properties Thrombus Aging  +---------+---------------+---------+-----------+----------+--------------+  CFV       Full            Yes       Yes                                    +---------+---------------+---------+-----------+----------+--------------+  SFJ       Full                                                             +---------+---------------+---------+-----------+----------+--------------+  FV Prox   Full                                                             +---------+---------------+---------+-----------+----------+--------------+  FV Mid    Full                                                             +---------+---------------+---------+-----------+----------+--------------+  FV Distal Full                                                             +---------+---------------+---------+-----------+----------+--------------+  PFV       Full                                                             +---------+---------------+---------+-----------+----------+--------------+  POP       Full            Yes       Yes                                    +---------+---------------+---------+-----------+----------+--------------+  PTV       Full                                                             +---------+---------------+---------+-----------+----------+--------------+  PERO      Full                                                             +---------+---------------+---------+-----------+----------+--------------+  Gastroc   Full                                                             +---------+---------------+---------+-----------+----------+--------------+   +----+---------------+---------+-----------+----------+--------------+  LEFT Compressibility Phasicity Spontaneity Properties Thrombus Aging   +----+---------------+---------+-----------+----------+--------------+  CFV  Full            Yes       Yes                                    +----+---------------+---------+-----------+----------+--------------+    Summary: RIGHT: - There is no evidence of deep vein thrombosis in the lower extremity.  - No cystic structure found in the popliteal fossa.  LEFT: - No evidence of common femoral vein obstruction.  *See table(s) above for measurements and observations. Electronically signed by Servando Snare MD on 01/18/2022 at 5:12:06 PM.    Final         Scheduled Meds:  amiodarone  200 mg Oral Daily   apixaban  5 mg Oral BID   atorvastatin  20 mg Oral QHS   clopidogrel  75 mg Oral Daily   finasteride  5 mg Oral QHS   furosemide  40 mg Intravenous Daily   guaiFENesin  600 mg Oral BID   insulin aspart  0-15 Units Subcutaneous TID WC   insulin aspart  0-5 Units Subcutaneous QHS   insulin aspart  3 Units Subcutaneous TID WC   insulin glargine-yfgn  30 Units Subcutaneous Q2200   ipratropium-albuterol  3 mL Nebulization BID   isosorbide mononitrate  30 mg Oral Daily   metoprolol tartrate  50 mg Oral BID   pantoprazole  40 mg Oral BID Stanley   sodium chloride flush  3 mL Intravenous Q12H   tamsulosin  0.4 mg Oral QPC supper   terazosin  2 mg Oral QHS   Continuous Infusions:  sodium chloride     cefTRIAXone (ROCEPHIN)  IV 2 g (01/19/22 2212)   doxycycline (VIBRAMYCIN) IV 100 mg (01/20/22 0541)     LOS: 3 days   Time spent: 66mn  Samanatha Brammer C Ameliya Nicotra, DO [facility]  If 7PM-7AM, please contact night-coverage [email or web address]  01/20/2022, 8:26 AM

## 2022-01-20 NOTE — Plan of Care (Signed)

## 2022-01-21 LAB — GLUCOSE, CAPILLARY
Glucose-Capillary: 169 mg/dL — ABNORMAL HIGH (ref 70–99)
Glucose-Capillary: 206 mg/dL — ABNORMAL HIGH (ref 70–99)
Glucose-Capillary: 207 mg/dL — ABNORMAL HIGH (ref 70–99)
Glucose-Capillary: 306 mg/dL — ABNORMAL HIGH (ref 70–99)

## 2022-01-21 LAB — CBC
HCT: 29.3 % — ABNORMAL LOW (ref 39.0–52.0)
Hemoglobin: 9.1 g/dL — ABNORMAL LOW (ref 13.0–17.0)
MCH: 22.8 pg — ABNORMAL LOW (ref 26.0–34.0)
MCHC: 31.1 g/dL (ref 30.0–36.0)
MCV: 73.3 fL — ABNORMAL LOW (ref 80.0–100.0)
Platelets: 188 10*3/uL (ref 150–400)
RBC: 4 MIL/uL — ABNORMAL LOW (ref 4.22–5.81)
RDW: 14.9 % (ref 11.5–15.5)
WBC: 4.1 10*3/uL (ref 4.0–10.5)
nRBC: 0 % (ref 0.0–0.2)

## 2022-01-21 LAB — BASIC METABOLIC PANEL
Anion gap: 6 (ref 5–15)
BUN: 21 mg/dL (ref 8–23)
CO2: 31 mmol/L (ref 22–32)
Calcium: 8.5 mg/dL — ABNORMAL LOW (ref 8.9–10.3)
Chloride: 104 mmol/L (ref 98–111)
Creatinine, Ser: 1.3 mg/dL — ABNORMAL HIGH (ref 0.61–1.24)
GFR, Estimated: 58 mL/min — ABNORMAL LOW (ref >60)
Glucose, Bld: 167 mg/dL — ABNORMAL HIGH (ref 70–99)
Potassium: 3.4 mmol/L — ABNORMAL LOW (ref 3.5–5.1)
Sodium: 141 mmol/L (ref 135–145)

## 2022-01-21 NOTE — Progress Notes (Signed)
PROGRESS NOTE    [name]  DPO:[id number] DOB: [birth year] DOA: 01/17/2022 PCP: [name], MD   Brief Narrative:  This is a 76 yo M with history of DM2, chronic kidney disease, diffuse non-Hodgkin's lymphoma, hypertension, OSA, coronary artery disease with history of PCI with DES to RCA January 2022 on Plavix, PAF on Eliquis and amiodarone, NSVT, who comes into the hospital with severe shortness of breath and worsening of his leg swelling.  Wife also felt that he had a fever.  He required BiPAP on admission.  Of note, he was admitted last July with concerns for endocarditis and treated with 6 weeks of antibiotics via PICC line.  Chest x-ray this admission showed bilateral lower lobe airspace disease worrisome for multifocal pneumonia or edema, and stable cardiomegaly.  He was given Lasix, placed on IV antibiotics and admitted to the hospital.   Subjective / 24h Interval events: No acute issues or events overnight, denies nausea vomiting diarrhea constipation headache fevers chills or chest pain  Assessment & Plan:   Principal Problem:   CAP (community acquired pneumonia) Active Problems:   DM2 (diabetes mellitus, type 2) ([place])   HTN (hypertension)   HLD (hyperlipidemia)   Paroxysmal A-fib (HCC)   Diffuse non-Hodgkin's lymphoma (HCC)   Hypomagnesemia   Cellulitis   Sepsis ([place])   Acute respiratory failure with hypoxia (HCC)   Acute on chronic combined systolic and diastolic CHF (congestive heart failure) (HCC)   Lactic acidosis   Acute hypoxic respiratory failure, likely multifactorial in the setting of acute on chronic combined CHF, possible community-acquired pneumonia, hemoptysis - Continue to wean oxygen as tolerated - Remains hypoxic with ambulation - Continue Lasix -volume overloaded but improving - Continue antibiotics x5 days with ceftriaxone, doxycycline through 01/22/22  Acute on chronic combined CHF - EF 55 to 60%, normal LVEF, grade 1 diastolic  dysfunction.   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  Most recent evaluation December 2020 with excellent response to treatment   Paroxysmal A-fib-continue amiodarone, Eliquis   Coronary artery disease-with history of stenting, continue Plavix   Hyperlipidemia-continue statin   BPH-continue Flomax   Anemia of chronic disease-hemoglobin overall stable, 9.1 this morning.  Type 2 diabetes mellitus, poorly controlled, with hyperglycemia - Increase long-acting, add mealtime insulin given elevated CBGs this morning.  - Hemoglobin A1c 9.0  DVT prophylaxis: Apixaban Code Status: Full Family Communication: At bedside  Status is: Inpatient  Dispo: The patient is from: Home              Anticipated d/c is to: Home              Anticipated d/c date is: 24 to 48 hours pending clinical course              Patient currently not medically stable for discharge  Consultants:  Cardiology  Procedures:  None  Antimicrobials:  Ceftriaxone, doxycycline x5 days  Subjective: No acute issues or events overnight, requesting discharge home which we discussed would be dependent on his respiratory status moving forward  Objective: Vitals:   01/21/22 0047 01/21/22 0454 01/21/22 0455 01/21/22 0742  BP: (!) 150/68 (!) 154/71  (!) 150/65  Pulse: 68 73  66  Resp: '18 18  20  '$ Temp: 97.6 F (36.4 C)  98 F (36.7 C)  98 F (36.7 C)  TempSrc: Oral Oral  Oral  SpO2: 95% 99%  95%  Weight:   112.1 kg   Height:        Intake/Output Summary (Last 24 hours) at 01/21/2022 0806 Last data filed at 01/21/2022 0746 Gross per 24 hour  Intake 600 ml  Output 2300 ml  Net -1700 ml    Filed Weights   01/19/22 0500 01/20/22 0700 01/21/22 0455   Weight: 116.3 kg 111.4 kg 112.1 kg    Examination:  General exam: Appears calm and comfortable  Respiratory system: Clear to auscultation. Respiratory effort normal. Cardiovascular system: S1 & S2 heard, RRR. No JVD, murmurs, rubs, gallops or clicks. No pedal edema. Gastrointestinal system: Abdomen is nondistended, soft and nontender. No organomegaly or masses felt. Normal bowel sounds heard. Extremities: Symmetric 5 x 5 power.   Data Reviewed: I have personally reviewed following labs and imaging studies  CBC: Recent Labs  Lab 01/17/22 2154 01/17/22 2202 01/17/22 2217 01/18/22 0204 01/19/22 0227 01/20/22 0215 01/21/22 0635  WBC 14.4*  --   --  10.7* 9.5 5.8 4.1  NEUTROABS 10.5*  --   --  9.7*  --   --   --   HGB 10.8*   < > 10.5* 9.3* 8.3* 8.4* 9.1*  HCT 34.6*   < > 31.0* 29.1* 26.0* 27.5* 29.3*  MCV 74.4*  --   --  73.1* 72.6* 74.3* 73.3*  PLT 259  --   --  173 196 195 188   < > = values in this interval not displayed.    Basic Metabolic Panel: Recent Labs  Lab 01/17/22 2154 01/17/22 2202 01/17/22 2217 01/18/22 0204 01/19/22 0227 01/20/22 0215 01/21/22 0635  NA 137 137 137 137 140 137 141  K 4.8 4.7 4.4 4.1 4.0 3.5 3.4*  CL 103 105  --  102 104 99 104  CO2 19*  --   --  '22 27 28 31  '$ GLUCOSE 308* 302*  --  350* 165* 220* 167*  BUN 23 26*  --  25* 35* 33* 21  CREATININE 1.45* 1.30*  --  1.60* 1.50* 1.64* 1.30*  CALCIUM 8.8*  --   --  8.4* 8.6* 8.4* 8.5*  MG 1.3*  --   --  1.6* 2.1  --   --   PHOS 3.6  --   --  3.7  --   --   --     GFR: Estimated Creatinine Clearance: 64.4 mL/min (A) (by C-G formula based on SCr of 1.3 mg/dL (H)). Liver Function Tests: Recent Labs  Lab 01/17/22 2154 01/18/22 0204 01/19/22 0227  AST 36 29 22  ALT 36 32 30  ALKPHOS 97 77 67  BILITOT 0.5 0.4 0.4  PROT 5.5* 5.1* 4.7*  ALBUMIN 3.6 3.2* 2.9*    No results for input(s): LIPASE, AMYLASE in the last 168 hours. No results for input(s): AMMONIA in the last 168  hours. Coagulation Profile: No results for input(s): INR, PROTIME in the last 168 hours. Cardiac Enzymes: No results for input(s): CKTOTAL, CKMB, CKMBINDEX, TROPONINI in the last 168 hours. BNP (last 3 results) No results for input(s): PROBNP in the last 8760 hours. HbA1C: No results for input(s): HGBA1C in the last 72 hours.  CBG: Recent Labs  Lab 01/20/22 0747 01/20/22 1108 01/20/22 1631 01/20/22 2121 01/21/22 0743  GLUCAP 167* 166* 215* 196* 169*    Lipid Profile: No results for input(s): CHOL, HDL, LDLCALC, TRIG,  CHOLHDL, LDLDIRECT in the last 72 hours. Thyroid Function Tests: No results for input(s): TSH, T4TOTAL, FREET4, T3FREE, THYROIDAB in the last 72 hours.  Anemia Panel: No results for input(s): VITAMINB12, FOLATE, FERRITIN, TIBC, IRON, RETICCTPCT in the last 72 hours. Sepsis Labs: Recent Labs  Lab 01/17/22 2154 01/17/22 2214 01/18/22 0014 01/18/22 0204 01/18/22 0445  PROCALCITON <0.10  --   --   --   --   LATICACIDVEN  --  2.7* 2.9* 2.8* 2.1*     Recent Results (from the past 240 hour(s))  Resp Panel by RT-PCR (Flu A&B, Covid) Nasopharyngeal Swab     Status: None   Collection Time: 01/17/22 10:02 PM   Specimen: Nasopharyngeal Swab; Nasopharyngeal(NP) swabs in vial transport medium  Result Value Ref Range Status   SARS Coronavirus 2 by RT PCR NEGATIVE NEGATIVE Final    Comment: (NOTE) SARS-CoV-2 target nucleic acids are NOT DETECTED.  The SARS-CoV-2 RNA is generally detectable in upper respiratory specimens during the acute phase of infection. The lowest concentration of SARS-CoV-2 viral copies this assay can detect is 138 copies/mL. A negative result does not preclude SARS-Cov-2 infection and should not be used as the sole basis for treatment or other patient management decisions. A negative result may occur with  improper specimen collection/handling, submission of specimen other than nasopharyngeal swab, presence of viral mutation(s) within  the areas targeted by this assay, and inadequate number of viral copies(<138 copies/mL). A negative result must be combined with clinical observations, patient history, and epidemiological information. The expected result is Negative.  Fact Sheet for Patients:  EntrepreneurPulse.com.au  Fact Sheet for Healthcare Providers:  IncredibleEmployment.be  This test is no t yet approved or cleared by the [place] FDA and  has been authorized for detection and/or diagnosis of SARS-CoV-2 by FDA under an Emergency Use Authorization (EUA). This EUA will remain  in effect (meaning this test can be used) for the duration of the COVID-19 declaration under Section 564(b)(1) of the Act, 21 U.S.C.section 360bbb-3(b)(1), unless the authorization is terminated  or revoked sooner.       Influenza A by PCR NEGATIVE NEGATIVE Final   Influenza B by PCR NEGATIVE NEGATIVE Final    Comment: (NOTE) The Xpert Xpress SARS-CoV-2/FLU/RSV plus assay is intended as an aid in the diagnosis of influenza from Nasopharyngeal swab specimens and should not be used as a sole basis for treatment. Nasal washings and aspirates are unacceptable for Xpert Xpress SARS-CoV-2/FLU/RSV testing.  Fact Sheet for Patients: EntrepreneurPulse.com.au  Fact Sheet for Healthcare Providers: IncredibleEmployment.be  This test is not yet approved or cleared by the [place] FDA and has been authorized for detection and/or diagnosis of SARS-CoV-2 by FDA under an Emergency Use Authorization (EUA). This EUA will remain in effect (meaning this test can be used) for the duration of the COVID-19 declaration under Section 564(b)(1) of the Act, 21 U.S.C. section 360bbb-3(b)(1), unless the authorization is terminated or revoked.  Performed at [facility], [place] [address] [place], [place] [address]   Respiratory (~20 pathogens) panel by PCR     Status:  None   Collection Time: 01/17/22 10:02 PM   Specimen: Nasopharyngeal Swab; Respiratory  Result Value Ref Range Status   Adenovirus NOT DETECTED NOT DETECTED Final   Coronavirus 229E NOT DETECTED NOT DETECTED Final    Comment: (NOTE) The Coronavirus on the Respiratory Panel, DOES NOT test for the novel  Coronavirus (2019 nCoV)    Coronavirus HKU1 NOT DETECTED NOT DETECTED Final  Coronavirus NL63 NOT DETECTED NOT DETECTED Final   Coronavirus OC43 NOT DETECTED NOT DETECTED Final   Metapneumovirus NOT DETECTED NOT DETECTED Final   Rhinovirus / Enterovirus NOT DETECTED NOT DETECTED Final   Influenza A NOT DETECTED NOT DETECTED Final   Influenza B NOT DETECTED NOT DETECTED Final   Parainfluenza Virus 1 NOT DETECTED NOT DETECTED Final   Parainfluenza Virus 2 NOT DETECTED NOT DETECTED Final   Parainfluenza Virus 3 NOT DETECTED NOT DETECTED Final   Parainfluenza Virus 4 NOT DETECTED NOT DETECTED Final   Respiratory Syncytial Virus NOT DETECTED NOT DETECTED Final   Bordetella pertussis NOT DETECTED NOT DETECTED Final   Bordetella Parapertussis NOT DETECTED NOT DETECTED Final   Chlamydophila pneumoniae NOT DETECTED NOT DETECTED Final   Mycoplasma pneumoniae NOT DETECTED NOT DETECTED Final    Comment: Performed at [facility], [place] [address], [place], [place] [address]  Urine Culture     Status: Abnormal   Collection Time: 01/17/22 10:15 PM   Specimen: Urine, Clean Catch  Result Value Ref Range Status   Specimen Description URINE, CLEAN CATCH  Final   Special Requests   Final    NONE Performed at [facility], [place] [address], [place], [place] [address]    Culture MULTIPLE SPECIES PRESENT, SUGGEST RECOLLECTION (A)  Final   Report Status 01/19/2022 FINAL  Final  Blood culture (routine x 2)     Status: None (Preliminary result)   Collection Time: 01/17/22 10:39 PM   Specimen: BLOOD RIGHT FOREARM  Result Value Ref Range Status   Specimen Description BLOOD RIGHT FOREARM  Final    Special Requests   Final    BOTTLES DRAWN AEROBIC AND ANAEROBIC Blood Culture results may not be optimal due to an inadequate volume of blood received in culture bottles   Culture   Final    NO GROWTH 3 DAYS Performed at [facility], [place] [address], [place], [place] [address]    Report Status PENDING  Incomplete  Blood culture (routine x 2)     Status: None (Preliminary result)   Collection Time: 01/17/22 10:41 PM   Specimen: BLOOD RIGHT HAND  Result Value Ref Range Status   Specimen Description BLOOD RIGHT HAND  Final   Special Requests   Final    BOTTLES DRAWN AEROBIC AND ANAEROBIC Blood Culture results may not be optimal due to an inadequate volume of blood received in culture bottles   Culture   Final    NO GROWTH 3 DAYS Performed at [facility], [place] [address], [place],  [address]    Report Status PENDING  Incomplete    Radiology Studies: DG CHEST PORT 1 VIEW  Result Date: 01/19/2022 CLINICAL DATA:  Dyspnea EXAM: PORTABLE CHEST 1 VIEW COMPARISON:  01/17/2022 FINDINGS: Midline trachea. Cardiomegaly accentuated by AP portable technique. Atherosclerosis in the transverse aorta. No pleural effusion or pneumothorax. Bibasilar airspace disease is felt to be slightly improved. The upper lungs are clear. IMPRESSION: Improvement in bibasilar airspace disease/pneumonia. Cardiomegaly. Aortic Atherosclerosis (ICD10-I70.0). Electronically Signed   By: Abigail Miyamoto M.D.   On: 01/19/2022 14:28     Scheduled Meds:  amiodarone  200 mg Oral Daily   apixaban  5 mg Oral BID   atorvastatin  20 mg Oral QHS   clopidogrel  75 mg Oral Daily   finasteride  5 mg Oral QHS   furosemide  40 mg Intravenous Daily   guaiFENesin  600 mg Oral BID  insulin aspart  0-15 Units Subcutaneous TID WC   insulin aspart  0-5 Units Subcutaneous QHS   insulin aspart  3 Units Subcutaneous TID WC   insulin glargine-yfgn  30 Units Subcutaneous Q2200   ipratropium-albuterol  3 mL Nebulization BID    isosorbide mononitrate  30 mg Oral Daily   metoprolol tartrate  50 mg Oral BID   pantoprazole  40 mg Oral BID AC   sodium chloride flush  3 mL Intravenous Q12H   tamsulosin  0.4 mg Oral QPC supper   terazosin  2 mg Oral QHS   Continuous Infusions:  sodium chloride     cefTRIAXone (ROCEPHIN)  IV 2 g (01/20/22 2130)   doxycycline (VIBRAMYCIN) IV 100 mg (01/21/22 0519)     LOS: 4 days   Time spent: 18mn  Zimal Weisensel C Shakaria Raphael, DO [facility]  If 7PM-7AM, please contact night-coverage [email or web address]  01/21/2022, 8:06 AM

## 2022-01-21 NOTE — Plan of Care (Signed)

## 2022-01-21 NOTE — Progress Notes (Signed)
?  Mobility Specialist Criteria Algorithm Info. ? ? 01/21/22 1445  ?Mobility  ?Activity Ambulated with assistance in hallway;Dangled on edge of bed  ?Range of Motion/Exercises Active;All extremities  ?Level of Assistance Modified independent, requires aide device or extra time  ?Assistive Device Front wheel walker  ?Distance Ambulated (ft) 480 ft  ?Activity Response Tolerated well  ? ?Patient received supine in bed asleep. Easily awoken and pleasantly agreed to participate in mobility. Patient ambulated in hallway independently with steady gait. Returned to room without complaint or incident. Was left lying supine in bed with all needs met, call bell in reach. ? ?01/21/2022 ?3:20 PM ? ?[place] Euan Wandler, CMS, BS EXP ?Acute Rehabilitation Services  ?POIPP:[phone] ?Office: [phone] ? ?

## 2022-01-21 NOTE — Care Management Important Message (Signed)
Important Message ? ?Patient Details  ?Name: [name] ?MRN: [id number] ?Date of Birth: 1946/06/08 ? ? ?Medicare Important Message Given:  Yes ? ? ? ? ?Shelda Altes ?01/21/2022, 9:04 AM ?

## 2022-01-21 NOTE — Progress Notes (Signed)
Progress Note  Patient Name: [name] Date of Encounter: 01/21/2022  Primary Cardiologist: None   Subjective   Patient seen and examined at the bedside.  He was lying in bed when I arrived.  His limited English visit was facilitated by a phone interpreter.  His native language is [place].  He offers no complaints at this time.  He also wanted me to speak with his daughter Alden Benjamin) and gave her updates.  Inpatient Medications    Scheduled Meds:  amiodarone  200 mg Oral Daily   apixaban  5 mg Oral BID   atorvastatin  20 mg Oral QHS   clopidogrel  75 mg Oral Daily   finasteride  5 mg Oral QHS   furosemide  40 mg Intravenous Daily   guaiFENesin  600 mg Oral BID   insulin aspart  0-15 Units Subcutaneous TID WC   insulin aspart  0-5 Units Subcutaneous QHS   insulin aspart  3 Units Subcutaneous TID WC   insulin glargine-yfgn  30 Units Subcutaneous Q2200   ipratropium-albuterol  3 mL Nebulization BID   isosorbide mononitrate  30 mg Oral Daily   metoprolol tartrate  50 mg Oral BID   pantoprazole  40 mg Oral BID AC   sodium chloride flush  3 mL Intravenous Q12H   tamsulosin  0.4 mg Oral QPC supper   terazosin  2 mg Oral QHS   Continuous Infusions:  sodium chloride     cefTRIAXone (ROCEPHIN)  IV 2 g (01/20/22 2130)   doxycycline (VIBRAMYCIN) IV 100 mg (01/21/22 0519)   PRN Meds: sodium chloride, acetaminophen **OR** acetaminophen, alum & mag hydroxide-simeth, HYDROcodone-acetaminophen, ipratropium-albuterol, ondansetron (ZOFRAN) IV, polyethylene glycol, sodium chloride flush   Vital Signs    Vitals:   01/21/22 0047 01/21/22 0454 01/21/22 0455 01/21/22 0742  BP: (!) 150/68 (!) 154/71  (!) 150/65  Pulse: 68 73  66  Resp: '18 18  20  '$ Temp: 97.6 F (36.4 C) 98 F (36.7 C)  98 F (36.7 C)  TempSrc: Oral Oral  Oral  SpO2: 95% 99%  95%  Weight:   112.1 kg   Height:        Intake/Output Summary (Last 24 hours) at 01/21/2022 0804 Last data filed at 01/21/2022 0746 Gross  per 24 hour  Intake 600 ml  Output 2300 ml  Net -1700 ml   Filed Weights   01/19/22 0500 01/20/22 0700 01/21/22 0455  Weight: 116.3 kg 111.4 kg 112.1 kg    Telemetry    Sinus rhythm  - Personally Reviewed  ECG    None - Personally Reviewed  Physical Exam    General: Comfortable, lying in bed Head: Atraumatic, normal size  Eyes: PEERLA, EOMI  Neck: Supple, normal JVD Cardiac: Normal S1, S2; RRR; no murmurs, rubs, or gallops Lungs: Clear to auscultation bilaterally Abd: Soft, nontender, no hepatomegaly  Ext: warm, no edema Musculoskeletal: No deformities, BUE and BLE strength normal and equal Skin: Warm and dry, no rashes. Neuro: Alert and oriented to person, place, time, and situation, CNII-XII grossly intact, no focal deficits  Psych: Normal mood and affect.  Labs    Chemistry Recent Labs  Lab 01/17/22 2154 01/17/22 2202 01/18/22 0204 01/19/22 0227 01/20/22 0215 01/21/22 0635  NA 137   < > 137 140 137 141  K 4.8   < > 4.1 4.0 3.5 3.4*  CL 103   < > 102 104 99 104  CO2 19*  --  '22 27 28 '$ 31  GLUCOSE 308*   < > 350* 165* 220* 167*  BUN 23   < > 25* 35* 33* 21  CREATININE 1.45*   < > 1.60* 1.50* 1.64* 1.30*  CALCIUM 8.8*  --  8.4* 8.6* 8.4* 8.5*  PROT 5.5*  --  5.1* 4.7*  --   --   ALBUMIN 3.6  --  3.2* 2.9*  --   --   AST 36  --  29 22  --   --   ALT 36  --  32 30  --   --   ALKPHOS 97  --  77 67  --   --   BILITOT 0.5  --  0.4 0.4  --   --   GFRNONAA 51*  --  45* 49* 44* 58*  ANIONGAP 15  --  '13 9 10 6   '$ < > = values in this interval not displayed.     Hematology Recent Labs  Lab 01/19/22 0227 01/20/22 0215 01/21/22 0635  WBC 9.5 5.8 4.1  RBC 3.58* 3.70* 4.00*  HGB 8.3* 8.4* 9.1*  HCT 26.0* 27.5* 29.3*  MCV 72.6* 74.3* 73.3*  MCH 23.2* 22.7* 22.8*  MCHC 31.9 30.5 31.1  RDW 15.1 15.2 14.9  PLT 196 195 188    Cardiac EnzymesNo results for input(s): TROPONINI in the last 168 hours. No results for input(s): TROPIPOC in the last 168 hours.    BNP Recent Labs  Lab 01/17/22 2154  BNP 220.4*     DDimer No results for input(s): DDIMER in the last 168 hours.   Radiology    DG CHEST PORT 1 VIEW  Result Date: 01/19/2022 CLINICAL DATA:  Dyspnea EXAM: PORTABLE CHEST 1 VIEW COMPARISON:  01/17/2022 FINDINGS: Midline trachea. Cardiomegaly accentuated by AP portable technique. Atherosclerosis in the transverse aorta. No pleural effusion or pneumothorax. Bibasilar airspace disease is felt to be slightly improved. The upper lungs are clear. IMPRESSION: Improvement in bibasilar airspace disease/pneumonia. Cardiomegaly. Aortic Atherosclerosis (ICD10-I70.0). Electronically Signed   By: Abigail Miyamoto M.D.   On: 01/19/2022 14:28    Cardiac Studies   TTE 01/18/2022 IMPRESSIONS   1. Left ventricular ejection fraction, by estimation, is 55 to 60%. The left ventricle has normal function. Left ventricular endocardial border not optimally defined to evaluate regional wall motion. Left ventricular diastolic parameters are consistent  with Grade I diastolic dysfunction (impaired relaxation).   2. Right ventricular systolic function is normal. The right ventricular size is normal. Tricuspid regurgitation signal is inadequate for assessing PA pressure.   3. Left atrial size was mild to moderately dilated.   4. Right atrial size was mild to moderately dilated.   5. The mitral valve is normal in structure. No evidence of mitral valve regurgitation. No evidence of mitral stenosis.   6. The aortic valve is tricuspid. There is moderate calcification of the aortic valve. Aortic valve regurgitation is not visualized. Mild aortic valve stenosis. Aortic valve mean gradient measures 13.0 mmHg.   7. Aortic dilatation noted. 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   8. The inferior vena cava is normal in size with greater than 50% respiratory variability, suggesting right atrial pressure of 3 mmHg.   9. Technically difficult study with poor  acoustic windows.   FINDINGS   Left Ventricle: Left ventricular ejection fraction, by estimation, is 55 to 60%. The left ventricle has normal function. Left ventricular endocardial border not optimally defined to evaluate regional wall motion.  Definity contrast agent was  given IV to delineate the left ventricular endocardial borders. The left ventricular  internal cavity size was normal in size. There is no left ventricular hypertrophy. Left ventricular diastolic parameters are consistent with Grade I diastolic dysfunction (impaired relaxation).   Right Ventricle: The right ventricular size is normal. No increase in right ventricular wall thickness. Right ventricular systolic function is normal. Tricuspid regurgitation signal is inadequate for assessing PA pressure.   Left Atrium: Left atrial size was mild to moderately dilated.   Right Atrium: Right atrial size was mild to moderately dilated.   Pericardium: There is no evidence of pericardial effusion.   Mitral Valve: The mitral valve is normal in structure. Mild mitral annular calcification. No evidence of mitral valve regurgitation. No evidence of mitral valve stenosis.   Tricuspid Valve: The tricuspid valve is normal in structure. Tricuspid valve regurgitation is trivial.   Aortic Valve: The aortic valve is tricuspid. There is moderate  calcification of the aortic valve. Aortic valve regurgitation is not  visualized. Mild aortic stenosis is present. Aortic valve mean gradient  measures 13.0 mmHg. Aortic valve peak gradient  measures 17.1 mmHg. Aortic valve area, by VTI measures 2.10 cm.   Pulmonic Valve: The pulmonic valve was normal in structure. Pulmonic valve  regurgitation is not visualized.   Aorta: The aortic root is normal in size and structure and aortic dilatation noted. 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   Venous: The inferior vena cava is normal in size with greater than 50% respiratory  variability, suggesting right atrial pressure of 3 mmHg.   IAS/Shunts: No atrial level shunt detected by color flow Doppler.   Patient Profile     76 y.o. male with history of CAD, PAF, NSVT, thoracic aneurysm, NHL, HTN, DM, CKD, OSA (on CPAP) and hx of possible endocarditis    Previously followed at [facility]    Following her for CHF   Pt admitted for sepsis    Assessment & Plan    Acute hypoxic respiratory failure which appears to be multifactorial in the setting of community-acquired pneumonia, acute on chronic diastolic heart failure-it appears that he is not tolerating being weaned off the oxygen he may need oxygen to go home.  This is being managed by the primary team.  He also is on antibiotics for his pneumonia ceftriaxone and doxycycline which is also being managed by the primary team. In terms of his acute exacerbation of heart failure he seems to be clinically improving-this is my first day evaluating the patient he appears euvolemic today.   Acute on chronic diastolic heart failure-appears to be euvolemic. Total output 24hr net -1740m. Could be transitioned to oral Lasix as it appears that all of his hypoxia is not completely related to his heart failure exacerbation.  He may have some underlying respiratory condition including his pneumonia that may be more contributory and reason why he still is requiring oxygen.  Coronary artery disease-no anginal symptoms.  He is status post PTCA as well as drug-eluting stent to the RCA with residual moderate disease.   Troponin elevation-type II MI in the setting of his infection as well as heart failure.  No further ischemic evaluation at this time.  Acute on chronic CKD-kidney function with creatinine 1.3 today, this is an improvement from yesterday which was 1.6.  Continue to monitor.  Sepsis-managed by the primary team.  Previous endocarditis status post treatment for that.  Patient clinically appears to be improving no need to pursue  aggressive evaluation including TEE.  Paroxysmal atrial fibrillation-he is on amiodarone and Eliquis in sinus rhythm today.  Continue his regimen.  Diabetes mellitus-this is being managed by the primary team.  Thoracic aortic aneurysm-he will need outpatient follow-up for serial imaging as his diameter is 4.7 cm.  Mild aortic stenosis-follow-up imaging per outpatient cardiologist   For questions or updates, please contact [place] Please consult [email or web address] for contact info under Cardiology/STEMI.      Signed, Makila Colombe, DO  01/21/2022, 8:04 AM

## 2022-01-21 NOTE — Progress Notes (Signed)
SATURATION QUALIFICATIONS: (This note is used to comply with regulatory documentation for home oxygen)  Patient Saturations on Room Air at Rest = 97%  Patient Saturations on Room Air while Ambulating = 93%  

## 2022-01-22 ENCOUNTER — Encounter: Payer: Self-pay | Admitting: Hematology and Oncology

## 2022-01-22 LAB — BASIC METABOLIC PANEL
Anion gap: 9 (ref 5–15)
BUN: 21 mg/dL (ref 8–23)
CO2: 27 mmol/L (ref 22–32)
Calcium: 8.4 mg/dL — ABNORMAL LOW (ref 8.9–10.3)
Chloride: 102 mmol/L (ref 98–111)
Creatinine, Ser: 1.39 mg/dL — ABNORMAL HIGH (ref 0.61–1.24)
GFR, Estimated: 53 mL/min — ABNORMAL LOW (ref >60)
Glucose, Bld: 234 mg/dL — ABNORMAL HIGH (ref 70–99)
Potassium: 3.1 mmol/L — ABNORMAL LOW (ref 3.5–5.1)
Sodium: 138 mmol/L (ref 135–145)

## 2022-01-22 LAB — CULTURE, BLOOD (ROUTINE X 2)
Culture: NO GROWTH
Culture: NO GROWTH

## 2022-01-22 LAB — GLUCOSE, CAPILLARY
Glucose-Capillary: 244 mg/dL — ABNORMAL HIGH (ref 70–99)
Glucose-Capillary: 236 mg/dL — ABNORMAL HIGH (ref 70–99)

## 2022-01-22 LAB — MAGNESIUM: Magnesium: 1.5 mg/dL — ABNORMAL LOW (ref 1.7–2.4)

## 2022-01-22 MED ORDER — POTASSIUM CHLORIDE CRYS ER 20 MEQ PO TBCR
40.0000 meq | EXTENDED_RELEASE_TABLET | Freq: Once | ORAL | Status: AC
  Administered 2022-01-22: 40 meq via ORAL
  Filled 2022-01-22: qty 2

## 2022-01-22 MED ORDER — METFORMIN HCL 1000 MG PO TABS: 1000.0000 mg | ORAL_TABLET | Freq: Every day | ORAL | 0 refills | Status: DC

## 2022-01-22 MED ORDER — FUROSEMIDE 40 MG PO TABS
40.0000 mg | ORAL_TABLET | Freq: Every day | ORAL | Status: DC
  Administered 2022-01-22: 40 mg via ORAL
  Filled 2022-01-22: qty 1

## 2022-01-22 MED ORDER — PANTOPRAZOLE SODIUM 40 MG PO TBEC: 40.0000 mg | DELAYED_RELEASE_TABLET | Freq: Two times a day (BID) | ORAL | 1 refills | Status: DC

## 2022-01-22 NOTE — Progress Notes (Signed)
Patient refused use of BIPAP for the evening.  °

## 2022-01-22 NOTE — Discharge Summary (Signed)
Physician Discharge Summary  [name] TKW:[id number] DOB: September 01, 1946 DOA: 01/17/2022  PCP: [name], MD  Admit date: 01/17/2022 Discharge date: 01/22/2022  Admitted From: Home Disposition:  Home  Recommendations for Outpatient Follow-up:  Follow up with PCP in 1-2 weeks Please obtain BMP/CBC in one week Please follow up with cardiology as scheduled  Home Health: None Equipment/Devices: None  Discharge Condition: Stable CODE STATUS: Full Diet recommendation: Low-salt low-fat low-carb diet  Brief/Interim Summary: This is a 76 yo M with history of DM2, chronic kidney disease, diffuse non-Hodgkin's lymphoma, hypertension, OSA, coronary artery disease with history of PCI with DES to RCA January 2022 on Plavix, PAF on Eliquis and amiodarone, NSVT, who comes into the hospital with severe shortness of breath and worsening of his leg swelling.  Wife also felt that he had a fever.  He required BiPAP on admission.  Of note, he was admitted last July with concerns for endocarditis and treated with 6 weeks of antibiotics via PICC line.  Chest x-ray this admission showed bilateral lower lobe airspace disease worrisome for multifocal pneumonia or edema, and stable cardiomegaly.  He was given Lasix, placed on IV antibiotics and admitted to the hospital.  Discharge Diagnoses:  Principal Problem:   CAP (community acquired pneumonia) Active Problems:   DM2 (diabetes mellitus, type 2) ([place])   HTN (hypertension)   HLD (hyperlipidemia)   Paroxysmal A-fib (HCC)   Diffuse non-Hodgkin's lymphoma ([place])   Hypomagnesemia   Cellulitis   Sepsis ([place])   Acute respiratory failure with hypoxia (HCC)   Acute on chronic combined systolic and diastolic CHF (congestive heart failure) (HCC)   Lactic acidosis  Acute hypoxic respiratory failure, likely multifactorial in the setting of: Acute on chronic combined CHF Sepsis secondary to community-acquired pneumonia, POA Hemoptysis - Continue to wean  oxygen as tolerated -now on room air - no oxygen required at DC - Completed antibiotics 01/22/22   Acute on chronic combined CHF - EF 55 to 60%, normal LVEF, grade 1 diastolic dysfunction.   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  Most recent evaluation December 2020 with excellent response to treatment Paroxysmal A-fib-continue amiodarone, Eliquis Coronary artery disease-with history of stenting, continue Plavix Hyperlipidemia-continue statin BPH-continue Flomax Anemia of chronic disease-hemoglobin overall stable, 9.1 this morning. Type 2 diabetes mellitus, poorly controlled, with hyperglycemia - A1C 9.0 - lengthy discussion in regards to diet/lifestyle changes. Continue home medications.  Discharge Instructions  Discharge Instructions     Discharge patient   Complete by: As directed    Discharge disposition: 01-Home or Self Care   Discharge patient date: 01/22/2022      Allergies as of 01/22/2022   No Known Allergies      Medication List     STOP taking these medications    amLODipine 10 MG tablet Commonly known as: NORVASC       TAKE these medications    allopurinol 300 MG tablet Commonly known as: ZYLOPRIM Take 300 mg by mouth at bedtime.   amiodarone 200 MG tablet Commonly known as: PACERONE Take 200 mg by mouth daily.   atorvastatin 20 MG tablet Commonly known as: LIPITOR Take 20 mg by mouth at bedtime.   clopidogrel 75 MG  tablet Commonly known as: PLAVIX Take 75 mg by mouth daily.   Eliquis 5 MG Tabs tablet Generic drug: apixaban Take 5 mg by mouth 2 (two) times daily.   finasteride 5 MG tablet Commonly known as:  PROSCAR Take 5 mg by mouth at bedtime.   glipiZIDE 10 MG tablet Commonly known as: GLUCOTROL Take 10 mg by mouth 2 (two) times daily before a meal.   isosorbide mononitrate 30 MG 24 hr tablet Commonly known as: IMDUR Take 30 mg by mouth daily.   LANTUS SOLOSTAR [place] Inject 34 Units into the skin at bedtime.   metFORMIN 1000 MG tablet Commonly known as: GLUCOPHAGE Take 1 tablet (1,000 mg total) by mouth daily with breakfast. Start taking on: January 25, 2022 What changed: These instructions start on January 25, 2022. If you are unsure what to do until then, ask your doctor or other care provider.   metoprolol tartrate 50 MG tablet Commonly known as: LOPRESSOR Take 50 mg by mouth 2 (two) times daily.   pantoprazole 40 MG tablet Commonly known as: PROTONIX Take 1 tablet (40 mg total) by mouth 2 (two) times daily before a meal.   prednisoLONE acetate 1 % ophthalmic suspension Commonly known as: PRED FORTE Place 1 drop into the left eye daily.   silodosin 8 MG Caps capsule Commonly known as: RAPAFLO Take 8 mg by mouth at bedtime.   terazosin 2 MG capsule Commonly known as: HYTRIN Take 2 mg by mouth at bedtime.        Follow-up Information     Fay Records, MD Follow up on 02/18/2022.   Specialty: Cardiology Why: 3:00 pm Contact information: [place] [address] [phone]                No Known Allergies  Consultations: Cardiology   Procedures/Studies: DG CHEST PORT 1 VIEW  Result Date: 01/19/2022 CLINICAL DATA:  Dyspnea EXAM: PORTABLE CHEST 1 VIEW COMPARISON:  01/17/2022 FINDINGS: Midline trachea. Cardiomegaly accentuated by AP portable technique. Atherosclerosis in the transverse aorta. No pleural effusion or pneumothorax. Bibasilar airspace disease is felt to be slightly improved. The upper lungs are clear. IMPRESSION: Improvement in bibasilar airspace disease/pneumonia. Cardiomegaly. Aortic Atherosclerosis (ICD10-I70.0).  Electronically Signed   By: Abigail Miyamoto M.D.   On: 01/19/2022 14:28   DG Chest Port 1 View  Result Date: 01/17/2022 CLINICAL DATA:  Shortness of breath. EXAM: PORTABLE CHEST 1 VIEW COMPARISON:  Chest x-ray 05/23/2021. FINDINGS: The heart is enlarged. There are patchy airspace opacities in both lung bases. No large pleural effusion or pneumothorax. No acute fractures. IMPRESSION: 1. Bilateral lower lobe airspace disease worrisome for multifocal pneumonia and/or edema. 2. Stable cardiomegaly. Electronically Signed   By: Ronney Asters M.D.   On: 01/17/2022 22:15   ECHOCARDIOGRAM COMPLETE  Result Date: 01/18/2022    ECHOCARDIOGRAM REPORT   Patient Name:   KATELYN KOHLMEYER Date of Exam: 01/18/2022 Medical Rec #:  [id number]        Height:       72.0 in Accession #:    [id number]       Weight:       267.9 lb Date of Birth:  [birth year]         BSA:          2.412 m Patient Age:    76 years         BP:           122/62 mmHg Patient  Gender: M                HR:           77 bpm. Exam Location:  Inpatient Procedure: 2D Echo and Intracardiac Opacification Agent Indications:    Acute systolic CHF  History:        Patient has no prior history of Echocardiogram examinations.  Sonographer:    [name] Referring Phys: [place]  1. Left ventricular ejection fraction, by estimation, is 55 to 60%. The left ventricle has normal function. Left ventricular endocardial border not optimally defined to evaluate regional wall motion. Left ventricular diastolic parameters are consistent with Grade I diastolic dysfunction (impaired relaxation).  2. Right ventricular systolic function is normal. The right ventricular size is normal. Tricuspid regurgitation signal is inadequate for assessing PA pressure.  3. Left atrial size was mild to moderately dilated.  4. Right atrial size was mild to moderately dilated.  5. The mitral valve is normal in structure. No evidence of mitral valve regurgitation. No evidence of  mitral stenosis.  6. The aortic valve is tricuspid. There is moderate calcification of the aortic valve. Aortic valve regurgitation is not visualized. Mild aortic valve stenosis. Aortic valve mean gradient measures 13.0 mmHg.  7. Aortic dilatation noted. 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  8. The inferior vena cava is normal in size with greater than 50% respiratory variability, suggesting right atrial pressure of 3 mmHg.  9. Technically difficult study with poor acoustic windows. FINDINGS  Left Ventricle: Left ventricular ejection fraction, by estimation, is 55 to 60%. The left ventricle has normal function. Left ventricular endocardial border not optimally defined to evaluate regional wall motion. Definity contrast agent was given IV to delineate the left ventricular endocardial borders. The left ventricular internal cavity size was normal in size. There is no left ventricular hypertrophy. Left ventricular diastolic parameters are consistent with Grade I diastolic dysfunction (impaired relaxation). Right Ventricle: The right ventricular size is normal. No increase in right ventricular wall thickness. Right ventricular systolic function is normal. Tricuspid regurgitation signal is inadequate for assessing PA pressure. Left Atrium: Left atrial size was mild to moderately dilated. Right Atrium: Right atrial size was mild to moderately dilated. Pericardium: There is no evidence of pericardial effusion. Mitral Valve: The mitral valve is normal in structure. Mild mitral annular calcification. No evidence of mitral valve regurgitation. No evidence of mitral valve stenosis. Tricuspid Valve: The tricuspid valve is normal in structure. Tricuspid valve regurgitation is trivial. Aortic Valve: The aortic valve is tricuspid. There is moderate calcification of the aortic valve. Aortic valve regurgitation is not visualized. Mild aortic stenosis is present. Aortic valve mean gradient measures 13.0  mmHg. Aortic valve peak gradient measures 17.1 mmHg. Aortic valve area, by VTI measures 2.10 cm. Pulmonic Valve: The pulmonic valve was normal in structure. Pulmonic valve regurgitation is not visualized. Aorta: The aortic root is normal in size and structure and aortic dilatation noted. 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 Venous: The inferior vena cava is normal in size with greater than 50% respiratory variability, suggesting right atrial pressure of 3 mmHg. IAS/Shunts: No atrial level shunt detected by color flow Doppler.  LEFT VENTRICLE PLAX 2D LVIDd:         5.73 cm   Diastology LVIDs:         4.02 cm   LV e' medial:    6.53 cm/s LV PW:  1.06 cm   LV E/e' medial:  16.5 LV IVS:        1.06 cm   LV e' lateral:   6.96 cm/s LVOT diam:     2.30 cm   LV E/e' lateral: 15.5 LV SV:         99 LV SV Index:   41 LVOT Area:     4.15 cm  RIGHT VENTRICLE RV Basal diam:  4.42 cm RV Mid diam:    3.54 cm RV S prime:     18.20 cm/s TAPSE (M-mode): 2.4 cm LEFT ATRIUM             Index        RIGHT ATRIUM           Index LA diam:        5.00 cm 2.07 cm/m   RA Area:     27.20 cm LA Vol (A2C):   83.7 ml 34.70 ml/m  RA Volume:   80.10 ml  33.21 ml/m LA Vol (A4C):   97.2 ml 40.29 ml/m LA Biplane Vol: 90.4 ml 37.48 ml/m  AORTIC VALVE AV Area (Vmax):    1.97 cm AV Area (Vmean):   2.01 cm AV Area (VTI):     2.10 cm AV Vmax:           207.00 cm/s AV Vmean:          146.000 cm/s AV VTI:            0.472 m AV Peak Grad:      17.1 mmHg AV Mean Grad:      13.0 mmHg LVOT Vmax:         98.25 cm/s LVOT Vmean:        70.750 cm/s LVOT VTI:          0.238 m LVOT/AV VTI ratio: 0.50  AORTA Ao Root diam: 3.40 cm Ao Asc diam:  4.70 cm MITRAL VALVE MV Area (PHT): 2.95 cm     SHUNTS MV Decel Time: 257 msec     Systemic VTI:  0.24 m MV E velocity: 108.00 cm/s  Systemic Diam: 2.30 cm MV A velocity: 104.00 cm/s MV E/A ratio:  1.04 Dalton McleanMD Electronically signed by Franki Monte Signature Date/Time:  01/18/2022/4:58:31 PM    Final    VAS [place] LOWER EXTREMITY VENOUS (DVT)  Result Date: 01/18/2022  Lower Venous DVT Study Patient Name:  JAQUAWN SAFFRAN  Date of Exam:   01/18/2022 Medical Rec #: [id number]         Accession #:    [id number] Date of Birth: 1946/02/25          Patient Gender: M Patient Age:   3 years Exam Location:  [facility] Procedure:      VAS [place] LOWER EXTREMITY VENOUS (DVT) Referring Phys: Nyoka Lint DOUTOVA --------------------------------------------------------------------------------  Indications: Edema Rt>Lt.  Risk Factors: CHF, non-Hodgkin lymphoma. Anticoagulation: Eliquis. Comparison Study: No prior studies. Performing Technologist: Darlin Coco RDMS, RVT  Examination Guidelines: A complete evaluation includes B-mode imaging, spectral Doppler, color Doppler, and power Doppler as needed of all accessible portions of each vessel. Bilateral testing is considered an integral part of a complete examination. Limited examinations for reoccurring indications may be performed as noted. The reflux portion of the exam is performed with the patient in reverse Trendelenburg.  +---------+---------------+---------+-----------+----------+--------------+  RIGHT     Compressibility Phasicity Spontaneity Properties Thrombus Aging  +---------+---------------+---------+-----------+----------+--------------+  CFV       Full  Yes       Yes                                    +---------+---------------+---------+-----------+----------+--------------+  SFJ       Full                                                             +---------+---------------+---------+-----------+----------+--------------+  FV Prox   Full                                                             +---------+---------------+---------+-----------+----------+--------------+  FV Mid    Full                                                             +---------+---------------+---------+-----------+----------+--------------+   FV Distal Full                                                             +---------+---------------+---------+-----------+----------+--------------+  PFV       Full                                                             +---------+---------------+---------+-----------+----------+--------------+  POP       Full            Yes       Yes                                    +---------+---------------+---------+-----------+----------+--------------+  PTV       Full                                                             +---------+---------------+---------+-----------+----------+--------------+  PERO      Full                                                             +---------+---------------+---------+-----------+----------+--------------+  Gastroc   Full                                                             +---------+---------------+---------+-----------+----------+--------------+   +----+---------------+---------+-----------+----------+--------------+  LEFT Compressibility Phasicity Spontaneity Properties Thrombus Aging  +----+---------------+---------+-----------+----------+--------------+  CFV  Full            Yes       Yes                                    +----+---------------+---------+-----------+----------+--------------+    Summary: RIGHT: - There is no evidence of deep vein thrombosis in the lower extremity.  - No cystic structure found in the popliteal fossa.  LEFT: - No evidence of common femoral vein obstruction.  *See table(s) above for measurements and observations. Electronically signed by Servando Snare MD on 01/18/2022 at 5:12:06 PM.    Final      Subjective: No acute issues/events overnight   Discharge Exam: Vitals:   01/22/22 0526 01/22/22 0751  BP: (!) 153/72   Pulse: 65   Resp: 20   Temp: 98 F (36.7 C)   SpO2: 95% 94%   Vitals:   01/21/22 2217 01/22/22 0526 01/22/22 0605 01/22/22 0751  BP: (!) 152/67 (!) 153/72    Pulse: 66 65    Resp: 16 20    Temp: 98 F  (36.7 C) 98 F (36.7 C)    TempSrc: Oral Oral    SpO2: 96% 95%  94%  Weight:   109.5 kg   Height:        General: Pt is alert, awake, not in acute distress Cardiovascular: RRR, S1/S2 +, no rubs, no gallops Respiratory: CTA bilaterally, no wheezing, no rhonchi Abdominal: Soft, NT, ND, bowel sounds + Extremities: no edema, no cyanosis    The results of significant diagnostics from this hospitalization (including imaging, microbiology, ancillary and laboratory) are listed below for reference.     Microbiology: Recent Results (from the past 240 hour(s))  Resp Panel by RT-PCR (Flu A&B, Covid) Nasopharyngeal Swab     Status: None   Collection Time: 01/17/22 10:02 PM   Specimen: Nasopharyngeal Swab; Nasopharyngeal(NP) swabs in vial transport medium  Result Value Ref Range Status   SARS Coronavirus 2 by RT PCR NEGATIVE NEGATIVE Final    Comment: (NOTE) SARS-CoV-2 target nucleic acids are NOT DETECTED.  The SARS-CoV-2 RNA is generally detectable in upper respiratory specimens during the acute phase of infection. The lowest concentration of SARS-CoV-2 viral copies this assay can detect is 138 copies/mL. A negative result does not preclude SARS-Cov-2 infection and should not be used as the sole basis for treatment or other patient management decisions. A negative result may occur with  improper specimen collection/handling, submission of specimen other than nasopharyngeal swab, presence of viral mutation(s) within the areas targeted by this assay, and inadequate number of viral copies(<138 copies/mL). A negative result must be combined with clinical observations, patient history, and epidemiological information. The expected result is Negative.  Fact Sheet for Patients:  EntrepreneurPulse.com.au  Fact Sheet for Healthcare Providers:  IncredibleEmployment.be  This test is no t yet approved or cleared by the [place] FDA and  has been  authorized for detection and/or diagnosis of SARS-CoV-2 by FDA under an Emergency Use Authorization (EUA). This EUA will remain  in effect (meaning this test can be used) for the duration of the COVID-19 declaration under Section 564(b)(1) of the Act, 21 U.S.C.section 360bbb-3(b)(1), unless the authorization is terminated  or revoked sooner.       Influenza A by PCR NEGATIVE NEGATIVE Final   Influenza B by PCR NEGATIVE NEGATIVE Final  Comment: (NOTE) The Xpert Xpress SARS-CoV-2/FLU/RSV plus assay is intended as an aid in the diagnosis of influenza from Nasopharyngeal swab specimens and should not be used as a sole basis for treatment. Nasal washings and aspirates are unacceptable for Xpert Xpress SARS-CoV-2/FLU/RSV testing.  Fact Sheet for Patients: EntrepreneurPulse.com.au  Fact Sheet for Healthcare Providers: IncredibleEmployment.be  This test is not yet approved or cleared by the [place] FDA and has been authorized for detection and/or diagnosis of SARS-CoV-2 by FDA under an Emergency Use Authorization (EUA). This EUA will remain in effect (meaning this test can be used) for the duration of the COVID-19 declaration under Section 564(b)(1) of the Act, 21 U.S.C. section 360bbb-3(b)(1), unless the authorization is terminated or revoked.  Performed at [facility], [place] [address], [place], [place] [address]   Respiratory (~20 pathogens) panel by PCR     Status: None   Collection Time: 01/17/22 10:02 PM   Specimen: Nasopharyngeal Swab; Respiratory  Result Value Ref Range Status   Adenovirus NOT DETECTED NOT DETECTED Final   Coronavirus 229E NOT DETECTED NOT DETECTED Final    Comment: (NOTE) The Coronavirus on the Respiratory Panel, DOES NOT test for the novel  Coronavirus (2019 nCoV)    Coronavirus HKU1 NOT DETECTED NOT DETECTED Final   Coronavirus NL63 NOT DETECTED NOT DETECTED Final   Coronavirus OC43 NOT DETECTED NOT  DETECTED Final   Metapneumovirus NOT DETECTED NOT DETECTED Final   Rhinovirus / Enterovirus NOT DETECTED NOT DETECTED Final   Influenza A NOT DETECTED NOT DETECTED Final   Influenza B NOT DETECTED NOT DETECTED Final   Parainfluenza Virus 1 NOT DETECTED NOT DETECTED Final   Parainfluenza Virus 2 NOT DETECTED NOT DETECTED Final   Parainfluenza Virus 3 NOT DETECTED NOT DETECTED Final   Parainfluenza Virus 4 NOT DETECTED NOT DETECTED Final   Respiratory Syncytial Virus NOT DETECTED NOT DETECTED Final   Bordetella pertussis NOT DETECTED NOT DETECTED Final   Bordetella Parapertussis NOT DETECTED NOT DETECTED Final   Chlamydophila pneumoniae NOT DETECTED NOT DETECTED Final   Mycoplasma pneumoniae NOT DETECTED NOT DETECTED Final    Comment: Performed at [facility], [place]. [address] [place], [place] [address]  Urine Culture     Status: Abnormal   Collection Time: 01/17/22 10:15 PM   Specimen: Urine, Clean Catch  Result Value Ref Range Status   Specimen Description URINE, CLEAN CATCH  Final   Special Requests   Final    NONE Performed at [facility], [place] [address], [place], [place] [address]    Culture MULTIPLE SPECIES PRESENT, SUGGEST RECOLLECTION (A)  Final   Report Status 01/19/2022 FINAL  Final  Blood culture (routine x 2)     Status: None (Preliminary result)   Collection Time: 01/17/22 10:39 PM   Specimen: BLOOD RIGHT FOREARM  Result Value Ref Range Status   Specimen Description BLOOD RIGHT FOREARM  Final   Special Requests   Final    BOTTLES DRAWN AEROBIC AND ANAEROBIC Blood Culture results may not be optimal due to an inadequate volume of blood received in culture bottles   Culture   Final    NO GROWTH 4 DAYS Performed at [facility], [place] [address] [place], [place] [address]    Report Status PENDING  Incomplete  Blood culture (routine x 2)     Status: None (Preliminary result)   Collection Time: 01/17/22 10:41 PM   Specimen: BLOOD RIGHT HAND  Result  Value Ref  Range Status   Specimen Description BLOOD RIGHT HAND  Final   Special Requests   Final    BOTTLES DRAWN AEROBIC AND ANAEROBIC Blood Culture results may not be optimal due to an inadequate volume of blood received in culture bottles   Culture   Final    NO GROWTH 4 DAYS Performed at [facility], [place] [address] [place], [place] [address]    Report Status PENDING  Incomplete     Labs: BNP (last 3 results) Recent Labs    01/17/22 2154  BNP 947.6*   Basic Metabolic Panel: Recent Labs  Lab 01/17/22 2154 01/17/22 2202 01/18/22 0204 01/19/22 0227 01/20/22 0215 01/21/22 0635 01/22/22 1012  NA 137   < > 137 140 137 141 138  K 4.8   < > 4.1 4.0 3.5 3.4* 3.1*  CL 103   < > 102 104 99 104 102  CO2 19*  --  '22 27 28 31 27  '$ GLUCOSE 308*   < > 350* 165* 220* 167* 234*  BUN 23   < > 25* 35* 33* 21 21  CREATININE 1.45*   < > 1.60* 1.50* 1.64* 1.30* 1.39*  CALCIUM 8.8*  --  8.4* 8.6* 8.4* 8.5* 8.4*  MG 1.3*  --  1.6* 2.1  --   --  1.5*  PHOS 3.6  --  3.7  --   --   --   --    < > = values in this interval not displayed.   Liver Function Tests: Recent Labs  Lab 01/17/22 2154 01/18/22 0204 01/19/22 0227  AST 36 29 22  ALT 36 32 30  ALKPHOS 97 77 67  BILITOT 0.5 0.4 0.4  PROT 5.5* 5.1* 4.7*  ALBUMIN 3.6 3.2* 2.9*   No results for input(s): LIPASE, AMYLASE in the last 168 hours. No results for input(s): AMMONIA in the last 168 hours. CBC: Recent Labs  Lab 01/17/22 2154 01/17/22 2202 01/17/22 2217 01/18/22 0204 01/19/22 0227 01/20/22 0215 01/21/22 0635  WBC 14.4*  --   --  10.7* 9.5 5.8 4.1  NEUTROABS 10.5*  --   --  9.7*  --   --   --   HGB 10.8*   < > 10.5* 9.3* 8.3* 8.4* 9.1*  HCT 34.6*   < > 31.0* 29.1* 26.0* 27.5* 29.3*  MCV 74.4*  --   --  73.1* 72.6* 74.3* 73.3*  PLT 259  --   --  173 196 195 188   < > = values in this interval not displayed.   Cardiac Enzymes: No results for input(s): CKTOTAL, CKMB, CKMBINDEX, TROPONINI in the last 168  hours. BNP: Invalid input(s): POCBNP CBG: Recent Labs  Lab 01/21/22 1146 01/21/22 1558 01/21/22 2127 01/22/22 0753 01/22/22 1204  GLUCAP 206* 207* 306* 244* 236*   D-Dimer No results for input(s): DDIMER in the last 72 hours. Hgb A1c No results for input(s): HGBA1C in the last 72 hours. Lipid Profile No results for input(s): CHOL, HDL, LDLCALC, TRIG, CHOLHDL, LDLDIRECT in the last 72 hours. Thyroid function studies No results for input(s): TSH, T4TOTAL, T3FREE, THYROIDAB in the last 72 hours.  Invalid input(s): FREET3 Anemia work up No results for input(s): VITAMINB12, FOLATE, FERRITIN, TIBC, IRON, RETICCTPCT in the last 72 hours. Urinalysis    Component Value Date/Time   COLORURINE YELLOW 01/17/2022 2330   APPEARANCEUR CLEAR 01/17/2022 2330   LABSPEC 1.011 01/17/2022 2330   PHURINE 5.0 01/17/2022 2330   GLUCOSEU 150 (A) 01/17/2022  [place] (A) 01/17/2022 2330   BILIRUBINUR NEGATIVE 01/17/2022 2330   KETONESUR NEGATIVE 01/17/2022 2330   PROTEINUR NEGATIVE 01/17/2022 2330   NITRITE NEGATIVE 01/17/2022 2330   LEUKOCYTESUR NEGATIVE 01/17/2022 2330   Sepsis Labs Invalid input(s): PROCALCITONIN,  WBC,  LACTICIDVEN Microbiology Recent Results (from the past 240 hour(s))  Resp Panel by RT-PCR (Flu A&B, Covid) Nasopharyngeal Swab     Status: None   Collection Time: 01/17/22 10:02 PM   Specimen: Nasopharyngeal Swab; Nasopharyngeal(NP) swabs in vial transport medium  Result Value Ref Range Status   SARS Coronavirus 2 by RT PCR NEGATIVE NEGATIVE Final    Comment: (NOTE) SARS-CoV-2 target nucleic acids are NOT DETECTED.  The SARS-CoV-2 RNA is generally detectable in upper respiratory specimens during the acute phase of infection. The lowest concentration of SARS-CoV-2 viral copies this assay can detect is 138 copies/mL. A negative result does not preclude SARS-Cov-2 infection and should not be used as the sole basis for treatment or other patient management  decisions. A negative result may occur with  improper specimen collection/handling, submission of specimen other than nasopharyngeal swab, presence of viral mutation(s) within the areas targeted by this assay, and inadequate number of viral copies(<138 copies/mL). A negative result must be combined with clinical observations, patient history, and epidemiological information. The expected result is Negative.  Fact Sheet for Patients:  EntrepreneurPulse.com.au  Fact Sheet for Healthcare Providers:  IncredibleEmployment.be  This test is no t yet approved or cleared by the [place] FDA and  has been authorized for detection and/or diagnosis of SARS-CoV-2 by FDA under an Emergency Use Authorization (EUA). This EUA will remain  in effect (meaning this test can be used) for the duration of the COVID-19 declaration under Section 564(b)(1) of the Act, 21 U.S.C.section 360bbb-3(b)(1), unless the authorization is terminated  or revoked sooner.       Influenza A by PCR NEGATIVE NEGATIVE Final   Influenza B by PCR NEGATIVE NEGATIVE Final    Comment: (NOTE) The Xpert Xpress SARS-CoV-2/FLU/RSV plus assay is intended as an aid in the diagnosis of influenza from Nasopharyngeal swab specimens and should not be used as a sole basis for treatment. Nasal washings and aspirates are unacceptable for Xpert Xpress SARS-CoV-2/FLU/RSV testing.  Fact Sheet for Patients: EntrepreneurPulse.com.au  Fact Sheet for Healthcare Providers: IncredibleEmployment.be  This test is not yet approved or cleared by the [place] FDA and has been authorized for detection and/or diagnosis of SARS-CoV-2 by FDA under an Emergency Use Authorization (EUA). This EUA will remain in effect (meaning this test can be used) for the duration of the COVID-19 declaration under Section 564(b)(1) of the Act, 21 U.S.C. section 360bbb-3(b)(1), unless the  authorization is terminated or revoked.  Performed at [facility], [place] [address] [place], [place] [address]   Respiratory (~20 pathogens) panel by PCR     Status: None   Collection Time: 01/17/22 10:02 PM   Specimen: Nasopharyngeal Swab; Respiratory  Result Value Ref Range Status   Adenovirus NOT DETECTED NOT DETECTED Final   Coronavirus 229E NOT DETECTED NOT DETECTED Final    Comment: (NOTE) The Coronavirus on the Respiratory Panel, DOES NOT test for the novel  Coronavirus (2019 nCoV)    Coronavirus HKU1 NOT DETECTED NOT DETECTED Final   Coronavirus NL63 NOT DETECTED NOT DETECTED Final   Coronavirus OC43 NOT DETECTED NOT DETECTED Final   Metapneumovirus NOT DETECTED NOT DETECTED Final   Rhinovirus / Enterovirus NOT DETECTED NOT DETECTED Final  Influenza A NOT DETECTED NOT DETECTED Final   Influenza B NOT DETECTED NOT DETECTED Final   Parainfluenza Virus 1 NOT DETECTED NOT DETECTED Final   Parainfluenza Virus 2 NOT DETECTED NOT DETECTED Final   Parainfluenza Virus 3 NOT DETECTED NOT DETECTED Final   Parainfluenza Virus 4 NOT DETECTED NOT DETECTED Final   Respiratory Syncytial Virus NOT DETECTED NOT DETECTED Final   Bordetella pertussis NOT DETECTED NOT DETECTED Final   Bordetella Parapertussis NOT DETECTED NOT DETECTED Final   Chlamydophila pneumoniae NOT DETECTED NOT DETECTED Final   Mycoplasma pneumoniae NOT DETECTED NOT DETECTED Final    Comment: Performed at [facility], [place] [address], [place], [place] [address]  Urine Culture     Status: Abnormal   Collection Time: 01/17/22 10:15 PM   Specimen: Urine, Clean Catch  Result Value Ref Range Status   Specimen Description URINE, CLEAN CATCH  Final   Special Requests   Final    NONE Performed at [facility], [place] [address] [place], [place] [address]    Culture MULTIPLE SPECIES PRESENT, SUGGEST RECOLLECTION (A)  Final   Report Status 01/19/2022 FINAL  Final  Blood culture (routine x 2)     Status:  None (Preliminary result)   Collection Time: 01/17/22 10:39 PM   Specimen: BLOOD RIGHT FOREARM  Result Value Ref Range Status   Specimen Description BLOOD RIGHT FOREARM  Final   Special Requests   Final    BOTTLES DRAWN AEROBIC AND ANAEROBIC Blood Culture results may not be optimal due to an inadequate volume of blood received in culture bottles   Culture   Final    NO GROWTH 4 DAYS Performed at [facility], [place] [address], [place], [place] [address]    Report Status PENDING  Incomplete  Blood culture (routine x 2)     Status: None (Preliminary result)   Collection Time: 01/17/22 10:41 PM   Specimen: BLOOD RIGHT HAND  Result Value Ref Range Status   Specimen Description BLOOD RIGHT HAND  Final   Special Requests   Final    BOTTLES DRAWN AEROBIC AND ANAEROBIC Blood Culture results may not be optimal due to an inadequate volume of blood received in culture bottles   Culture   Final    NO GROWTH 4 DAYS Performed at [facility], [place] [address], [place], [place] [address]    Report Status PENDING  Incomplete     Time coordinating discharge: Over 30 minutes  SIGNED:   Little Ishikawa, DO [facility] 01/22/2022, 12:32 PM Pager   If 7PM-7AM, please contact night-coverage [email or web address]

## 2022-01-22 NOTE — Progress Notes (Signed)
Brief cardiology follow up note: ? ?Assumed care from Dr. Harriet Masson. Chart reviewed. Noted to be euvolemic on exam yesterday, no acute cardiac issues. ? ?Acute hypoxic respiratory failure ?Sepsis ?-per primary team ? ?Diastolic heart failure, acute on chronic: noted to be euvolemic, recommended for oral lasix yesterday ? ?CAD: no symptoms, elevated troponin 2/2 demand ? ?[facility] will sign off.   ?Medication Recommendations:   ?Amiodarone 200 mg daily ?Apixaban 5 mg BID ?Atorvastatin 20 mg daily ?Clopidogrel 75 mg daily ?Isosorbide mononitrate 30 mg daily ?Metoprolol tartrate 50 mg bid ?Other recommendations (labs, testing, etc):  none ?Follow up as an outpatient:  Follows with [facility]--will need to get post discharge follow up appt. ? ?Buford Dresser, MD, PhD, [facility] ?[place]  ?[place]  [facility] ?[place], [address] ?[place], [place] [address] ?[phone]   ?

## 2022-01-22 NOTE — Progress Notes (Signed)
Discharge instructions given to patient.  Patient is able to understand simple [place].  Emphasized on heart failure care, diet, and symptoms of exacerbation to watch.  Encouraged to call his doctor for concerns or questions.  Patient verbalized understanding.  Patient called his wife, wife is calling his friend for ride home.  Patient will let RN know on time his friend will be able to pick him up. ?

## 2022-01-28 ENCOUNTER — Other Ambulatory Visit: Payer: Self-pay | Admitting: Physician Assistant

## 2022-01-29 ENCOUNTER — Telehealth: Payer: Self-pay

## 2022-01-29 ENCOUNTER — Encounter (HOSPITAL_COMMUNITY): Payer: Self-pay

## 2022-01-29 ENCOUNTER — Emergency Department (HOSPITAL_COMMUNITY)
Admission: EM | Admit: 2022-01-29 | Discharge: 2022-01-29 | Disposition: A | Discharge status: Home / Self Care | Payer: Medicare HMO | Attending: Emergency Medicine | Admitting: Emergency Medicine

## 2022-01-29 ENCOUNTER — Inpatient Hospital Stay: Payer: Medicare HMO | Attending: Hematology and Oncology

## 2022-01-29 ENCOUNTER — Other Ambulatory Visit: Payer: Self-pay

## 2022-01-29 ENCOUNTER — Telehealth: Payer: Self-pay | Admitting: Internal Medicine

## 2022-01-29 ENCOUNTER — Emergency Department (HOSPITAL_COMMUNITY): Payer: Medicare HMO

## 2022-01-29 ENCOUNTER — Inpatient Hospital Stay (HOSPITAL_BASED_OUTPATIENT_CLINIC_OR_DEPARTMENT_OTHER): Payer: Medicare HMO | Admitting: Physician Assistant

## 2022-01-29 VITALS — BP 140/70 | HR 69 | Temp 97.9°F | Resp 20 | Ht 72.0 in | Wt 260.6 lb

## 2022-01-29 DIAGNOSIS — R600 Localized edema: Secondary | ICD-10-CM | POA: Diagnosis present

## 2022-01-29 DIAGNOSIS — C8338 Diffuse large B-cell lymphoma, lymph nodes of multiple sites: Secondary | ICD-10-CM

## 2022-01-29 DIAGNOSIS — Z79899 Other long term (current) drug therapy: Secondary | ICD-10-CM | POA: Diagnosis not present

## 2022-01-29 DIAGNOSIS — D649 Anemia, unspecified: Secondary | ICD-10-CM | POA: Insufficient documentation

## 2022-01-29 DIAGNOSIS — R609 Edema, unspecified: Secondary | ICD-10-CM

## 2022-01-29 DIAGNOSIS — C8333 Diffuse large B-cell lymphoma, intra-abdominal lymph nodes: Secondary | ICD-10-CM

## 2022-01-29 DIAGNOSIS — I10 Essential (primary) hypertension: Secondary | ICD-10-CM | POA: Insufficient documentation

## 2022-01-29 DIAGNOSIS — D509 Iron deficiency anemia, unspecified: Secondary | ICD-10-CM | POA: Diagnosis not present

## 2022-01-29 DIAGNOSIS — Z7901 Long term (current) use of anticoagulants: Secondary | ICD-10-CM | POA: Diagnosis not present

## 2022-01-29 DIAGNOSIS — I2510 Atherosclerotic heart disease of native coronary artery without angina pectoris: Secondary | ICD-10-CM | POA: Insufficient documentation

## 2022-01-29 LAB — CMP (CANCER CENTER ONLY)
ALT: 60 U/L — ABNORMAL HIGH (ref 0–44)
AST: 41 U/L (ref 15–41)
Albumin: 3.6 g/dL (ref 3.5–5.0)
Alkaline Phosphatase: 85 U/L (ref 38–126)
Anion gap: 4 — ABNORMAL LOW (ref 5–15)
BUN: 21 mg/dL (ref 8–23)
CO2: 28 mmol/L (ref 22–32)
Calcium: 8.7 mg/dL — ABNORMAL LOW (ref 8.9–10.3)
Chloride: 106 mmol/L (ref 98–111)
Creatinine: 1.36 mg/dL — ABNORMAL HIGH (ref 0.61–1.24)
GFR, Estimated: 55 mL/min — ABNORMAL LOW (ref >60)
Glucose, Bld: 332 mg/dL — ABNORMAL HIGH (ref 70–99)
Potassium: 4.6 mmol/L (ref 3.5–5.1)
Sodium: 138 mmol/L (ref 135–145)
Total Bilirubin: 0.4 mg/dL (ref 0.3–1.2)
Total Protein: 5.3 g/dL — ABNORMAL LOW (ref 6.5–8.1)

## 2022-01-29 LAB — CBC WITH DIFFERENTIAL (CANCER CENTER ONLY)
Abs Immature Granulocytes: 0 10*3/uL (ref 0.00–0.07)
Basophils Absolute: 0 10*3/uL (ref 0.0–0.1)
Basophils Relative: 1 %
Eosinophils Absolute: 0.2 10*3/uL (ref 0.0–0.5)
Eosinophils Relative: 4 %
HCT: 28.5 % — ABNORMAL LOW (ref 39.0–52.0)
Hemoglobin: 8.8 g/dL — ABNORMAL LOW (ref 13.0–17.0)
Immature Granulocytes: 0 %
Lymphocytes Relative: 46 %
Lymphs Abs: 1.8 10*3/uL (ref 0.7–4.0)
MCH: 22.6 pg — ABNORMAL LOW (ref 26.0–34.0)
MCHC: 30.9 g/dL (ref 30.0–36.0)
MCV: 73.3 fL — ABNORMAL LOW (ref 80.0–100.0)
Monocytes Absolute: 0.6 10*3/uL (ref 0.1–1.0)
Monocytes Relative: 16 %
Neutro Abs: 1.2 10*3/uL — ABNORMAL LOW (ref 1.7–7.7)
Neutrophils Relative %: 33 %
Platelet Count: 195 10*3/uL (ref 150–400)
RBC: 3.89 MIL/uL — ABNORMAL LOW (ref 4.22–5.81)
RDW: 15.3 % (ref 11.5–15.5)
WBC Count: 3.8 10*3/uL — ABNORMAL LOW (ref 4.0–10.5)
nRBC: 0 % (ref 0.0–0.2)

## 2022-01-29 LAB — LACTATE DEHYDROGENASE: LDH: 185 U/L (ref 98–192)

## 2022-01-29 LAB — IRON AND IRON BINDING CAPACITY (CC-WL,HP ONLY)
Iron: 16 ug/dL — ABNORMAL LOW (ref 45–182)
Saturation Ratios: 4 % — ABNORMAL LOW (ref 17.9–39.5)
TIBC: 374 ug/dL (ref 250–450)
UIBC: 358 ug/dL (ref 117–376)

## 2022-01-29 LAB — TROPONIN I (HIGH SENSITIVITY): Troponin I (High Sensitivity): 14 ng/L (ref <18)

## 2022-01-29 LAB — BRAIN NATRIURETIC PEPTIDE: B Natriuretic Peptide: 209.1 pg/mL — ABNORMAL HIGH (ref 0.0–100.0)

## 2022-01-29 MED ORDER — FUROSEMIDE 10 MG/ML IJ SOLN
20.0000 mg | Freq: Once | INTRAMUSCULAR | Status: AC
  Administered 2022-01-29: 20 mg via INTRAVENOUS
  Filled 2022-01-29: qty 2

## 2022-01-29 MED ORDER — FUROSEMIDE 20 MG PO TABS: 20.0000 mg | ORAL_TABLET | Freq: Every day | ORAL | 0 refills | Status: DC

## 2022-01-29 NOTE — Telephone Encounter (Signed)
can you please reach out to his cardiology team and request a follow up. His edema in the legs have worsened since hospitalization. He is not taking any diuretics ? ?He has an appt 4/6 with Dr Harrington Challenger. He has not been seen in the office before but she is going to send a message to Dr Harrington Challenger and put him on the waiting list to be seen sooner.  She said a nurse will reach out to him but there is not anything they can really advise ?

## 2022-01-29 NOTE — ED Triage Notes (Signed)
PER EMS: pts cardiologist told him to come to the ER because of a 20 lb weight gain in one week that is not fluid. Pt is complaint free. English is limited, pt speaks [place].  ?BP- 160/81, HR-69, 96% RA.  ?

## 2022-01-29 NOTE — Telephone Encounter (Signed)
Spoke with Bonnita Nasuti with the [facility]... they saw the pt today and they have called [place] to inform us that he has had a 20 lb weight gain in less than a week... she says that he did not "appear" to be Sob but he had increased edema in his lower extremities.Marland KitchenMarland Kitchenpt was D/C a week ago with numerous serious health issues... according to their notes he is not on a diuretic which is different than his med list... Lasix 20 mg on discharge.  ? ?I tried to call the pt via [employer] but unable to LM due to no voicemail set up.  ? ?I called Jacqulynn Cadet his daughter on DPR and LM that she needs to take the pt to the ED at [facility] asap based on the information called to [place] late this afternoon.  ? ?I tried to call Bonnita Nasuti with the [facility] back for her assistance tor each the pt and to possibly obtain more information  re: the pts stability but the office is not closed.  ? ?Unsure how to reach the pt at this point sinmce he is need of immediate assessment and care. If pt has indeed gained 20 lbs in such a short amount of time.  ? ?Will forward note to the Oncologist that saw the pt today.  ? ? ?

## 2022-01-29 NOTE — Telephone Encounter (Signed)
Spoke with [name] and she will talk with her dad and urge him to go to the ED... she says she will call EMS. She says he has been SOB and she does not if he will agree to go but she says she will try.  ?

## 2022-01-29 NOTE — ED Notes (Signed)
Patient verbalizes understanding of discharge instructions. Opportunity for questioning and answers were provided. Armband removed by staff, pt discharged from ED via wheelchair.  

## 2022-01-29 NOTE — Discharge Instructions (Signed)
Take Lasix to help with fluid retention.  Follow-up with your primary care doctor next week to recheck your weight, kidney function and electrolytes.  Follow-up with cardiologist.  Overall suspect that the swelling is chronic from poor lymphatic system. ?

## 2022-01-29 NOTE — Telephone Encounter (Signed)
Pt c/o swelling: STAT is pt has developed SOB within 24 hours ? ?How much weight have you gained and in what time span?  ?Patient gained about 20 lbs in 1 week ? ?If swelling, where is the swelling located?  ?Feet/legs  ? ?Are you currently taking a fluid pill?  ?No  ? ?Are you currently SOB?  ?No  ? ?Do you have a log of your daily weights (if so, list)?  ?3/10: 241  ?3/17: 260 ? ?Have you gained 3 pounds in a day or 5 pounds in a week?  ? ? ?Have you traveled recently?  ? ? ?

## 2022-01-29 NOTE — ED Provider Notes (Signed)
?[facility] ?Provider Note ? ? ?CSN: [id number] ?Arrival date & time: 01/29/22  1839 ? ?  ? ?History ? ?Chief Complaint  ?Patient presents with  ? Leg Swelling  ? ? ?[name] is a 76 y.o. male. ? ?Patient states that he was sent here because of 20 pound weight gain over the past week.  Had lab work done at oncologist office today.  He states that he was recently admitted for the same.  Had fluid in his lungs.  Denies any shortness of breath or chest pain currently.  Has a history of hypertension, CAD, diffuse large B-cell lymphoma.  Denies any fever, cough, sputum production.  Patient is on Eliquis.  Patient has paroxysmal A-fib.  He is on amiodarone. ? ?The history is provided by the patient. A language interpreter was used.  ? ?  ? ?Home Medications ?Prior to Admission medications   ?Medication Sig Start Date End Date Taking? Authorizing Provider  ?furosemide (LASIX) 20 MG tablet Take 1 tablet (20 mg total) by mouth daily for 5 days. 01/29/22 02/03/22 Yes Deerica Waszak, DO  ?acetaminophen (TYLENOL) 650 MG CR tablet Take 650 mg by mouth every 8 (eight) hours as needed for pain or fever (headache).    [provider]  ?allopurinol (ZYLOPRIM) 300 MG tablet Take 1 tablet (300 mg total) by mouth daily. 06/09/21   Orson Slick, MD  ?allopurinol (ZYLOPRIM) 300 MG tablet Take 300 mg by mouth at bedtime.    [provider]  ?amiodarone (PACERONE) 200 MG tablet Take 200 mg by mouth every morning. 04/02/21   [provider]  ?amiodarone (PACERONE) 200 MG tablet Take 200 mg by mouth daily.    [provider]  ?amLODipine (NORVASC) 10 MG tablet Take 1 tablet by mouth once daily 11/19/21   Dede Query T, PA-C  ?apixaban (ELIQUIS) 5 MG TABS tablet Take 5 mg by mouth 2 (two) times daily.    [provider]  ?apixaban (ELIQUIS) 5 MG TABS tablet Take 5 mg by mouth 2 (two) times daily.    [provider]  ?atorvastatin (LIPITOR) 20  MG tablet Take 20 mg by mouth at bedtime. 11/04/20   [provider]  ?atorvastatin (LIPITOR) 20 MG tablet Take 20 mg by mouth at bedtime.    [provider]  ?clopidogrel (PLAVIX) 75 MG tablet Take 75 mg by mouth daily.    [provider]  ?clopidogrel (PLAVIX) 75 MG tablet Take 75 mg by mouth daily.    [provider]  ?finasteride (PROSCAR) 5 MG tablet Take 5 mg by mouth at bedtime. 05/14/21   [provider]  ?finasteride (PROSCAR) 5 MG tablet Take 5 mg by mouth at bedtime.    [provider]  ?glipiZIDE (GLUCOTROL) 10 MG tablet Take 10 mg by mouth 2 (two) times daily. 05/15/21   [provider]  ?glipiZIDE (GLUCOTROL) 10 MG tablet Take 10 mg by mouth 2 (two) times daily before a meal.    [provider]  ?Heparin Sodium Flush 100-0.9 UNIT/ML-% KIT Inject 500 Units into the vein 2 (two) times a week. Flush PICC with 250 units (1/2 syringe) 2 x weekly   discard remaining 08/17/21   Orson Slick, MD  ?Insulin Glargine (LANTUS SOLOSTAR [place]) Inject 34 Units into the skin at bedtime.    [provider]  ?insulin glargine (LANTUS) 100 unit/mL SOPN Inject 34 Units into the skin at bedtime.  [provider]  ?isosorbide mononitrate (IMDUR) 30 MG 24 hr tablet Take 30 mg by mouth every morning. 09/08/20   [provider]  ?isosorbide mononitrate (IMDUR) 30 MG 24 hr tablet Take 30 mg by mouth daily.    [provider]  ?metFORMIN (GLUCOPHAGE) 1000 MG tablet Take 1,000 mg by mouth 2 (two) times daily. 05/15/21   [provider]  ?metFORMIN (GLUCOPHAGE) 1000 MG tablet Take 1 tablet (1,000 mg total) by mouth daily with breakfast. 01/25/22   Little Ishikawa, MD  ?metoprolol tartrate (LOPRESSOR) 50 MG tablet Take 1 tablet (50 mg total) by mouth 2 (two) times daily. 05/05/21 10/27/21  Shelly Coss, MD  ?metoprolol tartrate (LOPRESSOR) 50 MG tablet Take 50 mg by mouth 2 (two) times daily.    [provider]  ?nitroGLYCERIN (NITROSTAT) 0.3 MG SL tablet Place 0.3 mg under the tongue every 5 (five) minutes x 3 doses as needed for chest pain. ?Patient not taking: Reported on 01/29/2022 09/08/20   [provider]  ?oxyCODONE (OXY IR/ROXICODONE) 5 MG immediate release tablet Take 1 tablet (5 mg total) by mouth every 4 (four) hours as needed for severe pain. ?Patient not taking: Reported on 11/06/2021 07/10/21   Orson Slick, MD  ?pantoprazole (PROTONIX) 40 MG tablet Take 1 tablet (40 mg total) by mouth 2 (two) times daily before a meal. 01/22/22   Little Ishikawa, MD  ?prednisoLONE acetate (PRED FORTE) 1 % ophthalmic suspension Place 1 drop into the left eye daily.    [provider]  ?prochlorperazine (COMPAZINE) 10 MG tablet Take 1 tablet (10 mg total) by mouth every 6 (six) hours as needed for nausea or vomiting. ?Patient not taking: Reported on 09/02/2021 06/09/21   Orson Slick, MD  ?silodosin (RAPAFLO) 8 MG CAPS capsule Take 8 mg by mouth at bedtime.    [provider]  ?Sodium Chloride Flush (NORMAL SALINE FLUSH) 0.9 % SOLN Flush PICC line with 62m 1-2 times a week as directed ?Patient not taking: Reported on 09/02/2021 08/14/21   DOrson Slick MD  ?terazosin (HYTRIN) 2 MG capsule Take 2 mg by mouth at bedtime.    [provider]  ?   ? ?Allergies    ?Patient has no known allergies.   ? ?Review of Systems   ?Review of Systems ? ?Physical Exam ?Updated Vital Signs ?BP (!) 157/74   Pulse 73   Temp 98.5 ?F (36.9 ?C) (Oral)   Resp 18   SpO2 97%  ?Physical Exam ?Vitals and nursing note reviewed.  ?Constitutional:   ?   General: He is not in acute distress. ?   Appearance: He is well-developed. He is not ill-appearing.  ?HENT:  ?   Head: Normocephalic and atraumatic.  ?   Mouth/Throat:  ?   Mouth: Mucous membranes are moist.  ?Eyes:  ?   Extraocular Movements: Extraocular movements intact.  ?   Conjunctiva/sclera: Conjunctivae normal.  ?   Pupils: Pupils  are equal, round, and reactive to light.  ?Cardiovascular:  ?   Rate and Rhythm: Normal rate and regular rhythm.  ?   Heart sounds: No murmur heard. ?Pulmonary:  ?   Effort: Pulmonary effort is normal. No respiratory distress.  ?   Breath sounds: Normal breath sounds.  ?Abdominal:  ?   Palpations: Abdomen is soft.  ?   Tenderness: There is no abdominal tenderness. There is no guarding.  ?Musculoskeletal:     ?  General: No swelling.  ?   Cervical back: Neck supple.  ?   Right lower leg: Edema (3+ pitting edema) present.  ?   Left lower leg: Edema (3+ pitting edema) present.  ?Skin: ?   General: Skin is warm and dry.  ?   Capillary Refill: Capillary refill takes less than 2 seconds.  ?Neurological:  ?   General: No focal deficit present.  ?   Mental Status: He is alert.  ?Psychiatric:     ?   Mood and Affect: Mood normal.  ? ? ?ED Results / Procedures / Treatments   ?Labs ?(all labs ordered are listed, but only abnormal results are displayed) ?Labs Reviewed  ?BRAIN NATRIURETIC PEPTIDE - Abnormal; Notable for the following components:  ?    Result Value  ? B Natriuretic Peptide 209.1 (*)   ? All other components within normal limits  ?TROPONIN I (HIGH SENSITIVITY)  ? ? ?EKG ?None ? ?Radiology ?DG Chest Portable 1 View ? ?Result Date: 01/29/2022 ?CLINICAL DATA:  Asymptomatic with 20 pound weight gain in 1 week. EXAM: PORTABLE CHEST 1 VIEW COMPARISON:  January 19, 2022 FINDINGS: The cardiac silhouette is mildly enlarged and unchanged in size. Moderate severity calcification of the aortic arch is noted. Mild, predominantly stable left basilar atelectasis and/or infiltrate is seen. There is no evidence of a pleural effusion or pneumothorax. The visualized skeletal structures are unremarkable. IMPRESSION: Mild, predominantly stable left basilar atelectasis and/or infiltrate. Electronically Signed   By: Virgina Norfolk M.D.   On: 01/29/2022 19:40   ? ?Procedures ?Procedures  ? ? ?Medications Ordered in ED ?Medications   ?furosemide (LASIX) injection 20 mg (has no administration in time range)  ? ? ?ED Course/ Medical Decision Making/ A&P ?  ?                        ?Medical Decision Making ?Amount and/or Complexity of Data Reviewed

## 2022-01-29 NOTE — ED Notes (Signed)
Pt daughter at bedside to help translate and take pt home  ?

## 2022-01-31 NOTE — Telephone Encounter (Signed)
Please make sure patient has a follow up appt  to evaluate edema ?

## 2022-02-01 ENCOUNTER — Telehealth: Payer: Self-pay

## 2022-02-01 ENCOUNTER — Encounter: Payer: Self-pay | Admitting: Hematology and Oncology

## 2022-02-01 ENCOUNTER — Telehealth: Payer: Self-pay | Admitting: Physician Assistant

## 2022-02-01 DIAGNOSIS — D509 Iron deficiency anemia, unspecified: Secondary | ICD-10-CM | POA: Insufficient documentation

## 2022-02-01 LAB — FERRITIN: Ferritin: 451 ng/mL — ABNORMAL HIGH (ref 24–336)

## 2022-02-01 MED ORDER — FERROUS SULFATE 325 (65 FE) MG PO TBEC: 325.0000 mg | DELAYED_RELEASE_TABLET | Freq: Every day | ORAL | 3 refills | Status: DC

## 2022-02-01 NOTE — Telephone Encounter (Signed)
Scheduled per 3/17 los, pt has been called and confirmed via interpreter ?

## 2022-02-01 NOTE — Telephone Encounter (Signed)
Please notify patient that labs confirm iron deficiency. I sent prescription of iron pills to her pharmacy on file. He needs to take it with a source of vitamin C (like orange juice) ? ?LM with pt's daughter Alma Friendly.   ?

## 2022-02-01 NOTE — Progress Notes (Addendum)
?[place] ?[phone]   [phone] ? ?PROGRESS NOTE ? ?Patient Care Team: ?[name], MD as PCP - General (Family Medicine) ?[name], MD (Family Medicine) ? ?Hematological/Oncological History ?# Diffuse Large B Cell Lymphoma, Stage III/IV ?04/30/2021: presented with RLQ abdominal pain. CT abdomen/pelvis shows pathologic mesenteric, retroperitoneal, and pelvic adenopathy, as described above. Moderate splenomegaly. Lobulated retroperitoneal soft tissue masses in the region of the right ureteropelvic junction likely resulting in mild right hydronephrosis secondary to extrinsic mass effect as well as within the right retroperitoneum ?05/04/2021: needle core biopsy of right retroperitoneal lymph node shows large B cell lymphoma. ?05/13/2021: establish care with Dr. Lorenso Courier ?05/23/2021-06/01/2021: admitted with urosepsis. Concern for endocarditis, started on 6 week course abx via PICC line.   ?06/09/2021-06/11/2021: Cycle 1 of R-CEOP ?07/01/2021-07/03/2021: Cycle 2 of R-CEOP ?07/22/2021-07/24/2021: Cycle 3 of R-CEOP ?08/12/2021-08/14/2021: Cycle 4 of R-CEOP ?09/02/2021-09/04/2021: Cycle 5 of R-CEOP ?09/07/2021: PET CT scan showed marked interval improvement with respect to nodal disease in the neck, chest, abdomen and pelvis, near complete resolution of visible, lymph nodes. Deauville criteria 2/3. ?09/23/2021-09/25/2021: Cycle 6 of R-CEOP ?11/04/2021: NM PET CT scan showed no suspicious lymphadenopathy in the neck, chest, abdomen, or pelvis. Deauville criteria 1. Consistent with a complete metabolic response.  ? ?Interval History:  ?[name] 76 y.o. male with medical history significant for stage III/IV diffuse large B-cell lymphoma presents for a follow up visit. The patient's last visit was on 11/06/2021. In the interim since the last visit he was admitted from 01/17/2022-01/22/2022 due to acute hypoxic respiratory failure likely multifactorial in the setting of  acute on chronic combined CHF, sepsis secondary to community-acquired pneumonia and hemoptysis.   ? ?On exam today, Mr. Aughenbaugh reports that while he was admitted, his bilateral lower extremity edema had markedly improved.  Unfortunately, since hospital discharge, his legs are swollen again causing pain and difficulty with ambulation.  He is wearing below the knee compression stockings with minimal improvement.  He needs a walker to ambulate at this time patient is no longer taking Lasix 20 mg/day.  He reports his energy levels and appetite have improved since hospital discharge.  He denies any nausea, vomiting or abdominal pain.  His bowel habits are regular without any diarrhea or constipation.  He denies easy bruising or signs of active bleeding.  Patient denies fevers, chills, night sweats, shortness of breath, chest pain or cough.  He has no other complaints.  Rest of the 10 point ROS is below ? ? ?MEDICAL HISTORY:  ?Past Medical History:  ?Diagnosis Date  ? Anginal pain ([place])   ? none recent per daughter yelena yaretskaya on 10-22-2021  ? Coronary artery disease   ? diffuse large b cell lymphoma   ? on chemo last infusion 09-26-2021  ? DM type 2   ? Dysrhythmia   ? atrial fib on anticoagulation  ? Easy bruising 06/18/2021  ? Endocarditis   ? 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 Hematuria 06/18/2021  ? Hypertension   ? Sleep apnea   ? uses cpap  ? Uses walker 07/24/2021  ? prn or cane  ? UTI (urinary tract infection)   ? finished antibiotics on 07-08-2021  ? Wound of skin 10/20/2021  ? sees wound care center for multiple dressing changes  ? ? ?SURGICAL HISTORY: ?Past Surgical History:  ?Procedure Laterality Date  ? CARDIAC CATHETERIZATION    ? CYSTOSCOPY W/ URETERAL STENT PLACEMENT Right 07/28/2021  ? Procedure: CYSTOSCOPY  WITH RETROGRADE PYELOGRAM/URETERAL STENT EXCHANGE;  Surgeon: [name], MD;  Location[facility];  Service: Urology;  Laterality: Right;  30  MINS  ? CYSTOSCOPY W/ URETERAL STENT PLACEMENT Right 10/27/2021  ? Procedure: CYSTOSCOPY WITH RETROGRADE PYELOGRAM/ URETERAL STENT REMOVAL;  Surgeon: [name], MD;  Location: [facility];  Service: Urology;  Laterality: Right;  ? CYSTOSCOPY WITH RETROGRADE PYELOGRAM, URETEROSCOPY AND STENT PLACEMENT Right 05/01/2021  ? Procedure: CYSTOSCOPY WITH RIGHT  RETROGRADE PYELOGRAM,  AND RIGHT STENT PLACEMENT;  Surgeon: Festus Aloe, MD;  Location: [facility];  Service: Urology;  Laterality: Right;  ? EYE SURGERY    ? INJECTION OF SILICONE OIL Left 62/83/6629  ? Procedure: INJECTION OF SILICONE OIL;  Surgeon: Sherlynn Stalls, MD;  Location: [place];  Service: Ophthalmology;  Laterality: Left;  ? INJECTION OF SILICONE OIL Left 47/65/4650  ? Procedure: INJECTION OF SILICONE OIL;  Surgeon: Sherlynn Stalls, MD;  Location: [place];  Service: Ophthalmology;  Laterality: Left;  ? JOINT REPLACEMENT Right   ? hip  ? LASER PHOTO ABLATION Left 12/25/2020  ? Procedure: LASER PHOTO ABLATION;  Surgeon: Sherlynn Stalls, MD;  Location: [place];  Service: Ophthalmology;  Laterality: Left;  ? PARS PLANA VITRECTOMY Left 11/10/2020  ? Procedure: PARS PLANA VITRECTOMY WITH 25 GAUGE;  Surgeon: Sherlynn Stalls, MD;  Location: [place];  Service: Ophthalmology;  Laterality: Left;  ? PARS PLANA VITRECTOMY Left 12/25/2020  ? Procedure: PARS PLANA VITRECTOMY WITH 25 GAUGE IN LEFT EYE;  Surgeon: Sherlynn Stalls, MD;  Location: [place];  Service: Ophthalmology;  Laterality: Left;  ? PERFLUORONE INJECTION Left 12/25/2020  ? Procedure: PERFLUORONE INJECTION;  Surgeon: Sherlynn Stalls, MD;  Location: [place];  Service: Ophthalmology;  Laterality: Left;  ? PHOTOCOAGULATION WITH LASER Left 11/10/2020  ? Procedure: PHOTOCOAGULATION WITH LASER;  Surgeon: Sherlynn Stalls, MD;  Location: [place];  Service: Ophthalmology;  Laterality: Left;  ? removal of picc line    ? 09-2021  ? 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 Procedure:  RETINECTOMY LEFT EYE;  Surgeon: Sherlynn Stalls, MD;  Location: [place];  Service: Ophthalmology;  Laterality: Left;  ? SILICON OIL REMOVAL Left 12/25/2020  ? Procedure: SILICONE OIL REMOVAL;  Surgeon: Sherlynn Stalls, MD;  Location: [place];  Service: Ophthalmology;  Laterality: Left;  ? TEE WITHOUT CARDIOVERSION N/A 05/29/2021  ? Procedure: TRANSESOPHAGEAL ECHOCARDIOGRAM (TEE);  Surgeon: Werner Lean, MD;  Location: [facility];  Service: Cardiovascular;  Laterality: N/A;  ? ? ?SOCIAL HISTORY: ?Social History  ? ?Socioeconomic History  ? Marital status: Married  ?  Spouse name: Not on file  ? Number of children: Not on file  ? Years of education: Not on file  ? Highest education level: Not on file  ?Occupational History  ? Not on file  ?Tobacco Use  ? Smoking status: Never  ? Smokeless tobacco: Never  ?Vaping Use  ? Vaping Use: Never used  ?Substance and Sexual Activity  ? Alcohol use: Not Currently  ? Drug use: Never  ? Sexual activity: Yes  ?Other Topics Concern  ? Not on file  ?Social History Narrative  ? Not on file  ? ?Social Determinants of Health  ? ?Financial Resource Strain: Not on file  ?Food Insecurity: Not on file  ?Transportation Needs: Not on file  ?Physical Activity: Not on file  ?Stress: Not on file  ?Social Connections: Not on file  ?Intimate Partner Violence: Not on file  ? ? ?FAMILY HISTORY: ?Family  History  ?Problem Relation Age of Onset  ? CAD Other   ? ? ?ALLERGIES:  has No Known Allergies. ? ?MEDICATIONS:  ?Current Outpatient Medications  ?Medication Sig Dispense Refill  ? acetaminophen (TYLENOL) 650 MG CR tablet Take 650 mg by mouth every 8 (eight) hours as needed for pain or fever (headache).    ? allopurinol (ZYLOPRIM) 300 MG tablet Take 1 tablet (300 mg total) by mouth daily. 90 tablet 1  ? amiodarone (PACERONE) 200 MG tablet Take 200 mg by mouth every morning.    ? amLODipine (NORVASC) 10 MG tablet Take 1 tablet by mouth once daily 30 tablet 0  ? apixaban (ELIQUIS) 5 MG TABS  tablet Take 5 mg by mouth 2 (two) times daily.    ? atorvastatin (LIPITOR) 20 MG tablet Take 20 mg by mouth at bedtime.    ? clopidogrel (PLAVIX) 75 MG tablet Take 75 mg by mouth daily.    ? finasteride (PROSCAR)

## 2022-02-01 NOTE — Telephone Encounter (Signed)
I spoke with the pts daughter, [name] per DPR, and offered an appt this week to see the pt since he is a post hops and had to go back to the ED this past Friday but she declined... she says he is feeling well and he has too many appts and he is refusing to make any more at this point... she asks to keep his 02/18/22 appt with Dr. Harrington Challenger and she will call sooner if he develops any problems and needs to be seen sooner.  ? ?His BNP this past Friday in the ED 209.1 ?HGB 8.8 ? ?She says that he does have an appt with his PCP this Wed 02/03/22.  ? ?She will have him elevate his extremities when sitting and watch his NA intake.  ? ?

## 2022-02-18 ENCOUNTER — Ambulatory Visit (INDEPENDENT_AMBULATORY_CARE_PROVIDER_SITE_OTHER): Payer: Medicare HMO | Admitting: Internal Medicine

## 2022-02-18 ENCOUNTER — Encounter: Payer: Self-pay | Admitting: Internal Medicine

## 2022-02-18 VITALS — BP 158/76 | HR 63 | Ht 72.0 in | Wt 256.8 lb

## 2022-02-18 DIAGNOSIS — I2510 Atherosclerotic heart disease of native coronary artery without angina pectoris: Secondary | ICD-10-CM

## 2022-02-18 MED ORDER — PREGABALIN 50 MG PO CAPS: 50.0000 mg | ORAL_CAPSULE | Freq: Every day | ORAL | 1 refills | Status: DC

## 2022-02-18 MED ORDER — TORSEMIDE 20 MG PO TABS: 40.0000 mg | ORAL_TABLET | Freq: Every day | ORAL | 1 refills | Status: DC

## 2022-02-18 NOTE — Progress Notes (Signed)
? ?Cardiology Office Note ? ? ?Date:  02/18/2022  ? ?ID:  [name], DOB 1946/10/12, MRN [id number] ? ?PCP:  [name], MD  ?Cardiologist:   Dorris Carnes, MD  ? ?Pt presents for follow up of LE edema    ?  ?History of Present Illness: ?[name] is a 76 y.o. 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  He was sent home without diuretic on 3/10. ?The pt was seen in ED on 3/17 with edema   Placed on lasix 20 ? ?Since then he has continued to have LE edema    Per the daughter, who is also translating for pt, the patient loves salty food   Does not want to weigh himself in am ? ?Denies CP   Breathing is fair  ?Neuropathic pain in feet ? ?Pt fell while in hosp    Hurt R hip ? ?Current Meds  ?Medication Sig  ? acetaminophen (TYLENOL) 650 MG CR tablet Take 650 mg by mouth every 8 (eight) hours as needed for pain or fever (headache).  ? allopurinol (ZYLOPRIM) 300 MG tablet Take 300 mg by mouth at bedtime.  ? amiodarone (PACERONE) 200 MG tablet Take 200 mg by mouth every morning.  ? apixaban (ELIQUIS) 5 MG TABS tablet Take 5 mg by mouth 2 (two) times daily.  ? atorvastatin (LIPITOR) 20 MG tablet Take 20 mg by mouth at bedtime.  ? clopidogrel (PLAVIX) 75 MG tablet Take 75 mg by mouth daily.  ? ferrous sulfate 325 (65 FE) MG EC tablet Take 1 tablet (325 mg total) by mouth daily with breakfast.  ? finasteride (PROSCAR) 5 MG tablet Take 5 mg by mouth at bedtime.  ? furosemide (LASIX) 20 MG tablet Take 1 tablet (20 mg total) by mouth daily for 5 days.  ? glipiZIDE (GLUCOTROL) 10 MG tablet Take 10 mg by mouth 2 (two) times daily.  ? Insulin Glargine (LANTUS SOLOSTAR [place])  Inject 34 Units into the skin at bedtime.  ? insulin glargine (LANTUS) 100 unit/mL SOPN Inject 34 Units into the skin at bedtime.  ? isosorbide mononitrate (IMDUR) 30 MG 24 hr tablet Take 30 mg by mouth every morning.  ? metFORMIN (GLUCOPHAGE) 1000 MG tablet Take 1 tablet (1,000 mg total) by mouth daily with breakfast.  ? nitroGLYCERIN (NITROSTAT) 0.3 MG SL tablet Place 0.3 mg under the tongue every 5 (five) minutes x 3 doses as needed for chest pain.  ? prednisoLONE acetate (PRED FORTE) 1 % ophthalmic suspension Place 1 drop into the left eye daily.  ? silodosin (RAPAFLO) 8 MG CAPS capsule Take 8 mg by mouth at bedtime.  ? terazosin (HYTRIN) 2 MG capsule Take 2 mg by mouth at bedtime.  ? ? ? ?Allergies:   Patient has no known allergies.  ? ?Past Medical History:  ?Diagnosis Date  ? Anginal pain ([place])   ? none recent per daughter yelena yaretskaya on 10-22-2021  ? Coronary artery disease   ? diffuse large b cell lymphoma   ? on chemo last infusion 09-26-2021  ?  DM type 2   ? Dysrhythmia   ? atrial fib on anticoagulation  ? Easy bruising 06/18/2021  ? Endocarditis   ? 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 Hematuria 06/18/2021  ? Hypertension   ? Sleep apnea   ? uses cpap  ? Uses walker 07/24/2021  ? prn or cane  ? UTI (urinary tract infection)   ? finished antibiotics on 07-08-2021  ? Wound of skin 10/20/2021  ? sees wound care center for multiple dressing changes  ? ? ?Past Surgical History:  ?Procedure Laterality Date  ? CARDIAC CATHETERIZATION    ? CYSTOSCOPY W/ URETERAL STENT PLACEMENT Right 07/28/2021  ? Procedure: CYSTOSCOPY WITH RETROGRADE PYELOGRAM/URETERAL STENT EXCHANGE;  Surgeon: [name], MD;  Location: [facility];  Service: Urology;  Laterality: Right;  30 MINS  ? CYSTOSCOPY W/ URETERAL STENT PLACEMENT Right 10/27/2021  ? Procedure: CYSTOSCOPY WITH RETROGRADE PYELOGRAM/ URETERAL STENT REMOVAL;  Surgeon: [name], MD;  Location: [facility];  Service: Urology;  Laterality: Right;  ? CYSTOSCOPY WITH RETROGRADE PYELOGRAM, URETEROSCOPY AND STENT PLACEMENT Right 05/01/2021  ? Procedure: CYSTOSCOPY WITH RIGHT  RETROGRADE PYELOGRAM,  AND RIGHT STENT PLACEMENT;  Surgeon: Festus Aloe, MD;  Location: [facility];  Service: Urology;  Laterality: Right;  ? EYE SURGERY    ? INJECTION OF SILICONE OIL Left 40/98/1191  ? Procedure: INJECTION OF SILICONE OIL;  Surgeon: Sherlynn Stalls, MD;  Location: [place];  Service: Ophthalmology;  Laterality: Left;  ? INJECTION OF SILICONE OIL Left 47/82/9562  ? Procedure: INJECTION OF SILICONE OIL;  Surgeon: Sherlynn Stalls, MD;  Location: [place];  Service: Ophthalmology;  Laterality: Left;  ? JOINT REPLACEMENT Right   ? hip  ? LASER PHOTO ABLATION Left 12/25/2020  ? Procedure: LASER PHOTO ABLATION;  Surgeon: Sherlynn Stalls, MD;  Location: [place];  Service: Ophthalmology;  Laterality: Left;  ? PARS PLANA VITRECTOMY Left 11/10/2020  ? Procedure: PARS PLANA VITRECTOMY WITH 25 GAUGE;  Surgeon: Sherlynn Stalls, MD;  Location: [place];  Service: Ophthalmology;  Laterality: Left;  ? PARS PLANA VITRECTOMY Left 12/25/2020  ? Procedure: PARS PLANA VITRECTOMY WITH 25 GAUGE IN LEFT EYE;  Surgeon: Sherlynn Stalls, MD;  Location: [place];  Service: Ophthalmology;  Laterality: Left;  ? PERFLUORONE INJECTION Left 12/25/2020  ? Procedure: PERFLUORONE INJECTION;  Surgeon: Sherlynn Stalls, MD;  Location: [place];  Service: Ophthalmology;  Laterality: Left;  ? PHOTOCOAGULATION WITH LASER Left 11/10/2020  ? Procedure: PHOTOCOAGULATION WITH LASER;  Surgeon: Sherlynn Stalls, MD;  Location: [place];  Service: Ophthalmology;  Laterality: Left;  ? removal of picc line    ? 09-2021  ? 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 Procedure: RETINECTOMY LEFT EYE;  Surgeon: Sherlynn Stalls, MD;  Location: [place];  Service: Ophthalmology;  Laterality: Left;  ? SILICON OIL REMOVAL Left 12/25/2020  ? Procedure: SILICONE OIL REMOVAL;  Surgeon:  Sherlynn Stalls, MD;  Location: [place];  Service: Ophthalmology;  Laterality: Left;  ? TEE WITHOUT CARDIOVERSION N/A 05/29/2021  ? Procedure: TRANSESOPHAGEAL ECHOCARDIOGRAM (TEE);  Surgeon: Werner Lean, MD;  Location: [facility];  Service: Cardiovascular;  Laterality: N/A;  ? ? ? ?Social History:  The patient  reports that he has never smoked. He has never used smokeless tobacco. He reports that he does not currently use alcohol. He reports that he does not use drugs.  ? ?Family History:  The patient's family history includes CAD in an other  family member.  ? ? ?ROS:  Please see the history of present illness. All other systems are reviewed and  Negative to the above problem except as noted.  ? ? ?PHYSICAL EXAM: ?VS:  BP (!) 158/76   Pulse 63   Ht 6' (1.829 m)   Wt 256 lb 12.8 oz (116.5 kg)   SpO2 95%   BMI 34.83 kg/m?   ?GEN: Well nourished, well developed, in no acute distress  ?HEENT: normal  ?Neck: no JVD, carotid bruits ?Cardiac: RRR; II/VI systolic murmur LSB   2+ LE  edema  ?Respiratory:  clear to auscultation bilaterally, normal work of breathing ?GI: soft, nontender, nondistended, + BS  No hepatomegaly  ?MS: Swelling over R hip  consistent with hematoma   Mild dolor  ?Skin: warm and dry, no rash ?Neuro:  Strength and sensation are intact ?Psych: euthymic mood, full affect ? ? ?EKG:  EKG is ordered today. ? ? ?eft ventricular ejection fraction, by estimation, is 55 to 60%. The left ventricle has ?normal function. Left ventricular endocardial border not optimally defined to evaluate ?regional wall motion. Left ventricular diastolic parameters are consistent with Grade I ?diastolic dysfunction (impaired relaxation). ?1. ?Right ventricular systolic function is normal. The right ventricular size is normal. ?Tricuspid regurgitation signal is inadequate for assessing PA pressure. ?2. ?3. Left atrial size was mild to moderately dilated. ?4. Right atrial size was mild to moderately dilated. ?The  mitral valve is normal in structure. No evidence of mitral valve regurgitation. No ?evidence of mitral stenosis. ?5. ?The aortic valve is tricuspid. There is moderate calcification of the aortic valve. Aortic ?valve r

## 2022-02-18 NOTE — Patient Instructions (Addendum)
Medication Instructions:  ?Your physician has recommended you make the following change in your medication:  ?Stop furosemide. ?Start torsemide 40 mg by mouth daily. ?Start Lyrica 50 mg by mouth daily ? ? ?*If you need a refill on your cardiac medications before your next appointment, please call your pharmacy* ? ? ?Lab Work: ?none ?If you have labs (blood work) drawn today and your tests are completely normal, you will receive your results only by: ?MyChart Message (if you have MyChart) OR ?A paper copy in the mail ?If you have any lab test that is abnormal or we need to change your treatment, we will call you to review the results. ? ? ?Testing/Procedures: ?none ? ? ?Follow-Up: ?At [facility], you and your health needs are our priority.  As part of our continuing mission to provide you with exceptional heart care, we have created designated Provider Care Teams.  These Care Teams include your primary Cardiologist (physician) and Advanced Practice Providers (APPs -  Physician Assistants and Nurse Practitioners) who all work together to provide you with the care you need, when you need it. ? ?We recommend signing up for the patient portal called "MyChart".  Sign up information is provided on this After Visit Summary.  MyChart is used to connect with patients for Virtual Visits (Telemedicine).  Patients are able to view lab/test results, encounter notes, upcoming appointments, etc.  Non-urgent messages can be sent to your provider as well.   ?To learn more about what you can do with MyChart, go to [email or web address].   ? ?Your next appointment:   ?3 week(s) ? ?The format for your next appointment:   ?In Person ? ?Provider:   ?Dr Harrington Challenger or APP ? ?If primary card or EP is not listed click here to update    :1}  ? ? ?Other Instructions ?  ?You have been scheduled to see Dr Gladstone Lighter at [facility] on February 25, 2022.  Appointment is at 2:00.  Arrive at 1:30 to complete paperwork.  There will be a Optometrist at this  appointment.  Office address is [facility]  [address] [place], [place] [address] ?

## 2022-03-05 ENCOUNTER — Encounter: Payer: Self-pay | Admitting: Hematology and Oncology

## 2022-03-05 ENCOUNTER — Other Ambulatory Visit: Payer: Self-pay | Admitting: Hematology and Oncology

## 2022-03-05 DIAGNOSIS — C8338 Diffuse large B-cell lymphoma, lymph nodes of multiple sites: Secondary | ICD-10-CM

## 2022-03-08 ENCOUNTER — Other Ambulatory Visit: Payer: Self-pay | Admitting: Hematology and Oncology

## 2022-03-08 MED ORDER — OXYCODONE HCL 5 MG PO TABS: 5.0000 mg | ORAL_TABLET | ORAL | 0 refills | Status: DC | PRN

## 2022-03-09 ENCOUNTER — Encounter: Payer: Self-pay | Admitting: Hematology and Oncology

## 2022-03-10 ENCOUNTER — Encounter: Payer: Self-pay | Admitting: Hematology and Oncology

## 2022-03-11 NOTE — Progress Notes (Signed)
?Cardiology Office Note:   ? ?Date:  03/13/2022  ? ?ID:  [name], DOB December 24, 1945, MRN [id number] ? ?PCP:  [name], MD ?  ?[facility] Providers ?Cardiologist:  Dorris Carnes, MD    ? ?Referring MD: [name], *  ? ?Chief Complaint: follow-up leg edema ? ?History of Present Illness:   ? ?[name] is a 76 y.o. male with a hx of CAD s/p PCI/DES to RCA 11/2020, hypertension, chronic combined CHF, PAF on chronic anticoagulation, thoracic aortic aneurysm, NSVT, mild aortic stenosis, diabetes, large B cell lymphoma.  He was treated for endocarditis with 6 weeks of antibiotics in July 2022. ? ?Our office received a call from the cancer center on 01/29/2022 with report of 20 pound weight gain in less than 1 week and increased lower extremity edema.  Patient was not complaining of shortness of breath.  He was advised to go to the ED.  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 He was previously a patient of [place] [facility]. ? ?He was last seen in our office on 02/18/2022 by Dr. Harrington Challenger for leg edema.  He had been seen in the ED on 01/29/2022 and placed on Lasix 20 mg daily.  Per his daughter he loves salty food and does not monitor sodium intake and does not weigh himself.  He was changed from furosemide to torsemide 40 mg daily.  Lyrica 50 mg daily was added for diabetic neuropathy he was advised to follow-up in 3 weeks. ? ?Today, he is here with his wife and daughter who are translating for him. He is in a wheelchair. He reports leg edema has not improved on torsemide 40 mg daily. Reports strong urine outflow, and feels that weight is stable at home. By our scale he is up 11 lb. Daughter reports that his weight was 250s at home. 3 + pitting bilateral leg edema. Has dyspnea on exertion with moving around the house and going to appointments. Otherwise not very active. He denies chest pain, fatigue,  palpitations, melena, hematuria, hemoptysis, diaphoresis, weakness, presyncope, and syncope. 2 pillow orthopnea is stable, no PND. Sleeps with CPAP. He has reduced sodium in his diet per his wife but he frequently has his legs in a dependent position.  ? ?Past Medical History:  ?Diagnosis Date  ? Anginal pain ([place])   ? none recent per daughter yelena yaretskaya on 10-22-2021  ? Coronary artery disease   ? diffuse large b cell lymphoma   ? on chemo last infusion 09-26-2021  ? DM type 2   ? Dysrhythmia   ? atrial fib on anticoagulation  ? Easy bruising 06/18/2021  ? Endocarditis   ? 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 Hematuria 06/18/2021  ? Hypertension   ? Sleep apnea   ? uses cpap  ? Uses walker 07/24/2021  ? prn or cane  ? UTI (urinary tract infection)   ? finished antibiotics on 07-08-2021  ? Wound of skin 10/20/2021  ? sees wound care center for multiple dressing changes  ? ? ?Past Surgical History:  ?Procedure Laterality Date  ? CARDIAC CATHETERIZATION    ? CYSTOSCOPY W/ URETERAL STENT PLACEMENT Right 07/28/2021  ? Procedure: CYSTOSCOPY WITH RETROGRADE PYELOGRAM/URETERAL STENT EXCHANGE;  Surgeon: [name], MD;  Location: [facility];  Service: Urology;  Laterality: Right;  30 MINS  ? CYSTOSCOPY W/ URETERAL STENT PLACEMENT Right  10/27/2021  ? Procedure: CYSTOSCOPY WITH RETROGRADE PYELOGRAM/ URETERAL STENT REMOVAL;  Surgeon: [name], MD;  Location: [facility];  Service: Urology;  Laterality: Right;  ? CYSTOSCOPY WITH RETROGRADE PYELOGRAM, URETEROSCOPY AND STENT PLACEMENT Right 05/01/2021  ? Procedure: CYSTOSCOPY WITH RIGHT  RETROGRADE PYELOGRAM,  AND RIGHT STENT PLACEMENT;  Surgeon: Festus Aloe, MD;  Location: [facility];  Service: Urology;  Laterality: Right;  ? EYE SURGERY    ? INJECTION OF SILICONE OIL Left 28/31/5176  ? Procedure: INJECTION OF SILICONE OIL;  Surgeon: Sherlynn Stalls, MD;  Location: [place];  Service: Ophthalmology;   Laterality: Left;  ? INJECTION OF SILICONE OIL Left 16/05/3709  ? Procedure: INJECTION OF SILICONE OIL;  Surgeon: Sherlynn Stalls, MD;  Location: [place];  Service: Ophthalmology;  Laterality: Left;  ? JOINT REPLACEMENT Right   ? hip  ? LASER PHOTO ABLATION Left 12/25/2020  ? Procedure: LASER PHOTO ABLATION;  Surgeon: Sherlynn Stalls, MD;  Location: [place];  Service: Ophthalmology;  Laterality: Left;  ? PARS PLANA VITRECTOMY Left 11/10/2020  ? Procedure: PARS PLANA VITRECTOMY WITH 25 GAUGE;  Surgeon: Sherlynn Stalls, MD;  Location: [place];  Service: Ophthalmology;  Laterality: Left;  ? PARS PLANA VITRECTOMY Left 12/25/2020  ? Procedure: PARS PLANA VITRECTOMY WITH 25 GAUGE IN LEFT EYE;  Surgeon: Sherlynn Stalls, MD;  Location: [place];  Service: Ophthalmology;  Laterality: Left;  ? PERFLUORONE INJECTION Left 12/25/2020  ? Procedure: PERFLUORONE INJECTION;  Surgeon: Sherlynn Stalls, MD;  Location: [place];  Service: Ophthalmology;  Laterality: Left;  ? PHOTOCOAGULATION WITH LASER Left 11/10/2020  ? Procedure: PHOTOCOAGULATION WITH LASER;  Surgeon: Sherlynn Stalls, MD;  Location: [place];  Service: Ophthalmology;  Laterality: Left;  ? removal of picc line    ? 09-2021  ? 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 Procedure: RETINECTOMY LEFT EYE;  Surgeon: Sherlynn Stalls, MD;  Location: [place];  Service: Ophthalmology;  Laterality: Left;  ? SILICON OIL REMOVAL Left 12/25/2020  ? Procedure: SILICONE OIL REMOVAL;  Surgeon: Sherlynn Stalls, MD;  Location: [place];  Service: Ophthalmology;  Laterality: Left;  ? TEE WITHOUT CARDIOVERSION N/A 05/29/2021  ? Procedure: TRANSESOPHAGEAL ECHOCARDIOGRAM (TEE);  Surgeon: Werner Lean, MD;  [facility];  Service: Cardiovascular;  Laterality: N/A;  ? ? ?Current Medications: ?Current Meds  ?Medication Sig  ? acetaminophen (TYLENOL) 650 MG CR tablet Take 650 mg by mouth every 8 (eight) hours as needed for pain or fever (headache).  ? allopurinol (ZYLOPRIM) 300 MG  tablet Take 300 mg by mouth at bedtime.  ? amiodarone (PACERONE) 200 MG tablet Take 200 mg by mouth every morning.  ? apixaban (ELIQUIS) 5 MG TABS tablet Take 5 mg by mouth 2 (two) times daily.  ? atorvastatin (LIPITOR) 20 MG tablet Take 20 mg by mouth at bedtime.  ? clopidogrel (PLAVIX) 75 MG tablet Take 75 mg by mouth daily.  ? ferrous sulfate 325 (65 FE) MG EC tablet Take 1 tablet (325 mg total) by mouth daily with breakfast.  ? finasteride (PROSCAR) 5 MG tablet Take 5 mg by mouth at bedtime.  ? glipiZIDE (GLUCOTROL) 10 MG tablet Take 10 mg by mouth 2 (two) times daily.  ? insulin glargine (LANTUS) 100 unit/mL SOPN Inject 34 Units into the skin at bedtime.  ? isosorbide mononitrate (IMDUR) 30 MG 24 hr tablet Take 30 mg by mouth every morning.  ? metFORMIN (GLUCOPHAGE) 1000 MG tablet Take 1 tablet (1,000 mg total) by mouth daily with  breakfast.  ? nitroGLYCERIN (NITROSTAT) 0.3 MG SL tablet Place 0.3 mg under the tongue every 5 (five) minutes x 3 doses as needed for chest pain.  ? pantoprazole (PROTONIX) 40 MG tablet Take 1 tablet (40 mg total) by mouth 2 (two) times daily before a meal.  ? prednisoLONE acetate (PRED FORTE) 1 % ophthalmic suspension Place 1 drop into the left eye daily.  ? pregabalin (LYRICA) 50 MG capsule Take 1 capsule (50 mg total) by mouth daily.  ? silodosin (RAPAFLO) 8 MG CAPS capsule Take 8 mg by mouth at bedtime.  ? terazosin (HYTRIN) 2 MG capsule Take 2 mg by mouth at bedtime.  ? torsemide (DEMADEX) 20 MG tablet Take 2 tablets (40 mg total) by mouth daily.  ?  ? ?Allergies:   Patient has no known allergies.  ? ?Social History  ? ?Socioeconomic History  ? Marital status: Married  ?  Spouse name: Not on file  ? Number of children: Not on file  ? Years of education: Not on file  ? Highest education level: Not on file  ?Occupational History  ? Not on file  ?Tobacco Use  ? Smoking status: Never  ? Smokeless tobacco: Never  ?Vaping Use  ? Vaping Use: Never used  ?Substance and Sexual Activity  ?  Alcohol use: Not Currently  ? Drug use: Never  ? Sexual activity: Yes  ?Other Topics Concern  ? Not on file  ?Social History Narrative  ? Not on file  ? ?Social Determinants of Health  ? ?Financial Re

## 2022-03-12 ENCOUNTER — Ambulatory Visit (HOSPITAL_COMMUNITY)
Admission: RE | Admit: 2022-03-12 | Discharge: 2022-03-12 | Disposition: A | Discharge status: Home / Self Care | Payer: Medicare HMO | Source: Ambulatory Visit | Attending: Hematology and Oncology | Admitting: Hematology and Oncology

## 2022-03-12 ENCOUNTER — Encounter: Payer: Self-pay | Admitting: Nurse Practitioner

## 2022-03-12 ENCOUNTER — Ambulatory Visit (INDEPENDENT_AMBULATORY_CARE_PROVIDER_SITE_OTHER): Payer: Medicare HMO | Admitting: Nurse Practitioner

## 2022-03-12 VITALS — BP 128/64 | HR 70 | Ht 72.0 in | Wt 267.2 lb

## 2022-03-12 DIAGNOSIS — I7121 Aneurysm of the ascending aorta, without rupture: Secondary | ICD-10-CM

## 2022-03-12 DIAGNOSIS — C8338 Diffuse large B-cell lymphoma, lymph nodes of multiple sites: Secondary | ICD-10-CM | POA: Diagnosis present

## 2022-03-12 DIAGNOSIS — I5043 Acute on chronic combined systolic (congestive) and diastolic (congestive) heart failure: Secondary | ICD-10-CM | POA: Diagnosis not present

## 2022-03-12 DIAGNOSIS — I480 Paroxysmal atrial fibrillation: Secondary | ICD-10-CM

## 2022-03-12 DIAGNOSIS — I2510 Atherosclerotic heart disease of native coronary artery without angina pectoris: Secondary | ICD-10-CM | POA: Diagnosis not present

## 2022-03-12 MED ORDER — SODIUM CHLORIDE (PF) 0.9 % IJ SOLN
INTRAMUSCULAR | Status: AC
  Filled 2022-03-12: qty 50

## 2022-03-12 MED ORDER — IOHEXOL 300 MG/ML  SOLN
100.0000 mL | Freq: Once | INTRAMUSCULAR | Status: AC | PRN
  Administered 2022-03-12: 100 mL via INTRAVENOUS

## 2022-03-12 NOTE — Patient Instructions (Signed)
?  Medication Instructions:  ? ?Please continue on torsemide one (1) tablet by mouth (40 mg) daily.  Do not change until we notify you about your labs.  ? ?*If you need a refill on your cardiac medications before your next appointment, please call your pharmacy* ? ? ?Lab Work: ? ?TODAY!!!!! BMET ? ?If you have labs (blood work) drawn today and your tests are completely normal, you will receive your results only by: ?MyChart Message (if you have MyChart) OR ?A paper copy in the mail ?If you have any lab test that is abnormal or we need to change your treatment, we will call you to review the results. ? ?Follow-Up: ?At [facility], you and your health needs are our priority.  As part of our continuing mission to provide you with exceptional heart care, we have created designated Provider Care Teams.  These Care Teams include your primary Cardiologist (physician) and Advanced Practice Providers (APPs -  Physician Assistants and Nurse Practitioners) who all work together to provide you with the care you need, when you need it. ? ?We recommend signing up for the patient portal called "MyChart".  Sign up information is provided on this After Visit Summary.  MyChart is used to connect with patients for Virtual Visits (Telemedicine).  Patients are able to view lab/test results, encounter notes, upcoming appointments, etc.  Non-urgent messages can be sent to your provider as well.   ?To learn more about what you can do with MyChart, go to [email or web address].   ? ?Your next appointment:   ?3 month(s) ? ?The format for your next appointment:   ?In Person ? ?Provider:   ?Dorris Carnes, MD   ? ?Important Information About Sugar ? ? ? ? ?  ?For your  leg edema you  should do  the following ?1. Leg elevation - I recommend the Lounge Dr. Leg rest.  See below for details  ?2. Salt restriction  -  Use potassium chloride instead of regular salt as a salt substitute. ?3. Walk regularly ?4. Compression hose - Medical Supply store   ?5. Weight loss  ? ? ?Available on [email or web address] ?Or  ?Go to [email or web address] ? ? ? ? ?

## 2022-03-13 ENCOUNTER — Encounter: Payer: Self-pay | Admitting: Nurse Practitioner

## 2022-03-13 ENCOUNTER — Encounter: Payer: Self-pay | Admitting: Hematology and Oncology

## 2022-03-13 LAB — BASIC METABOLIC PANEL
BUN/Creatinine Ratio: 24 (ref 10–24)
BUN: 43 mg/dL — ABNORMAL HIGH (ref 8–27)
CO2: 24 mmol/L (ref 20–29)
Calcium: 8.9 mg/dL (ref 8.6–10.2)
Chloride: 100 mmol/L (ref 96–106)
Creatinine, Ser: 1.82 mg/dL — ABNORMAL HIGH (ref 0.76–1.27)
Glucose: 328 mg/dL — ABNORMAL HIGH (ref 70–99)
Potassium: 4.4 mmol/L (ref 3.5–5.2)
Sodium: 140 mmol/L (ref 134–144)
eGFR: 38 mL/min/{1.73_m2} — ABNORMAL LOW (ref >59)

## 2022-03-15 ENCOUNTER — Encounter: Payer: Self-pay | Admitting: *Deleted

## 2022-03-15 ENCOUNTER — Telehealth: Payer: Self-pay | Admitting: *Deleted

## 2022-03-15 ENCOUNTER — Other Ambulatory Visit: Payer: Self-pay | Admitting: *Deleted

## 2022-03-15 DIAGNOSIS — I5043 Acute on chronic combined systolic (congestive) and diastolic (congestive) heart failure: Secondary | ICD-10-CM

## 2022-03-15 MED ORDER — TORSEMIDE 20 MG PO TABS: 20.0000 mg | ORAL_TABLET | Freq: Every day | ORAL | 1 refills | Status: DC

## 2022-03-15 NOTE — Telephone Encounter (Signed)
-----   Message from Orson Slick, MD sent at 03/15/2022  9:40 AM EDT ----- ?Please let [name] and his daughter know that he CT scan of the spine did not show any evidence of recurrence in the spine, however there were findings of spinal stenosis and degenerative changes of the spine that may explain his symptoms. We would recommend referral to a spine surgeon for evaluation. Please send the referral if the patient and his daughter are agreeable. ? ?----- Message ----- ?From: Interface, Rad Results In ?Sent: 03/15/2022   8:21 AM EDT ?To: Orson Slick, MD ? ? ?

## 2022-03-15 NOTE — Telephone Encounter (Signed)
TCT Arlee Muslim, pt's daughter. Regarding results of spine CT scan, ?No answer  and no vm available. My Chart message saent. ?

## 2022-03-17 ENCOUNTER — Telehealth: Payer: Self-pay | Admitting: *Deleted

## 2022-03-17 ENCOUNTER — Other Ambulatory Visit: Payer: Self-pay | Admitting: *Deleted

## 2022-03-17 DIAGNOSIS — C8338 Diffuse large B-cell lymphoma, lymph nodes of multiple sites: Secondary | ICD-10-CM

## 2022-03-17 NOTE — Telephone Encounter (Signed)
Referral sent to [facility] related to patient's ongoing back pain. ?Daughter,Lidiya aware. ?

## 2022-03-19 ENCOUNTER — Other Ambulatory Visit: Payer: Medicare HMO | Admitting: *Deleted

## 2022-03-19 ENCOUNTER — Encounter: Payer: Self-pay | Admitting: Hematology and Oncology

## 2022-03-19 DIAGNOSIS — I5043 Acute on chronic combined systolic (congestive) and diastolic (congestive) heart failure: Secondary | ICD-10-CM

## 2022-03-20 ENCOUNTER — Encounter: Payer: Self-pay | Admitting: Hematology and Oncology

## 2022-03-20 LAB — BASIC METABOLIC PANEL
BUN/Creatinine Ratio: 12 (ref 10–24)
BUN: 20 mg/dL (ref 8–27)
CO2: 26 mmol/L (ref 20–29)
Calcium: 8.9 mg/dL (ref 8.6–10.2)
Chloride: 100 mmol/L (ref 96–106)
Creatinine, Ser: 1.72 mg/dL — ABNORMAL HIGH (ref 0.76–1.27)
Glucose: 406 mg/dL — ABNORMAL HIGH (ref 70–99)
Potassium: 4.5 mmol/L (ref 3.5–5.2)
Sodium: 139 mmol/L (ref 134–144)
eGFR: 41 mL/min/{1.73_m2} — ABNORMAL LOW (ref >59)

## 2022-04-02 ENCOUNTER — Other Ambulatory Visit: Payer: Self-pay | Admitting: Hematology and Oncology

## 2022-04-02 MED ORDER — OXYCODONE HCL 5 MG PO TABS: 5.0000 mg | ORAL_TABLET | ORAL | 0 refills | Status: DC | PRN

## 2022-04-14 ENCOUNTER — Other Ambulatory Visit: Payer: Self-pay | Admitting: Neurological Surgery

## 2022-04-14 ENCOUNTER — Other Ambulatory Visit (HOSPITAL_COMMUNITY): Payer: Self-pay | Admitting: Neurological Surgery

## 2022-04-14 DIAGNOSIS — M47816 Spondylosis without myelopathy or radiculopathy, lumbar region: Secondary | ICD-10-CM

## 2022-04-20 ENCOUNTER — Encounter (HOSPITAL_COMMUNITY): Payer: Self-pay

## 2022-04-20 ENCOUNTER — Ambulatory Visit (HOSPITAL_COMMUNITY)
Admission: RE | Admit: 2022-04-20 | Discharge: 2022-04-20 | Disposition: A | Discharge status: Home / Self Care | Payer: Medicare HMO | Source: Ambulatory Visit | Attending: Physician Assistant | Admitting: Physician Assistant

## 2022-04-20 DIAGNOSIS — C8338 Diffuse large B-cell lymphoma, lymph nodes of multiple sites: Secondary | ICD-10-CM | POA: Insufficient documentation

## 2022-04-20 MED ORDER — IOHEXOL 300 MG/ML  SOLN
80.0000 mL | Freq: Once | INTRAMUSCULAR | Status: AC | PRN
  Administered 2022-04-20: 80 mL via INTRAVENOUS

## 2022-04-20 MED ORDER — SODIUM CHLORIDE (PF) 0.9 % IJ SOLN
INTRAMUSCULAR | Status: AC
  Filled 2022-04-20: qty 50

## 2022-04-22 ENCOUNTER — Ambulatory Visit (HOSPITAL_COMMUNITY)
Admission: RE | Admit: 2022-04-22 | Discharge: 2022-04-22 | Disposition: A | Discharge status: Home / Self Care | Payer: Medicare HMO | Source: Ambulatory Visit | Attending: Neurological Surgery | Admitting: Neurological Surgery

## 2022-04-22 DIAGNOSIS — M47816 Spondylosis without myelopathy or radiculopathy, lumbar region: Secondary | ICD-10-CM | POA: Diagnosis present

## 2022-04-28 ENCOUNTER — Telehealth: Payer: Self-pay

## 2022-04-28 NOTE — Telephone Encounter (Signed)
   Pre-operative Risk Assessment    Patient Name: [name]  DOB: 1946/07/20 MRN: [id number]      Request for Surgical Clearance    Procedure:   Lumbar Spine B/L L5-S1 ESI  Date of Surgery:  Clearance TBD                                 Surgeon:  Lenord Carbo, MD Surgeon's Group or Practice Name:  [facility] Phone number:  [phone] Fax number:  [phone]   Type of Clearance Requested:   - Medical  - Pharmacy:  Hold Clopidogrel (Plavix) Eliquis 3 days prior, the patient can resume the blood thinner: continue morning after?   Type of Anesthesia:  Not Indicated   Additional requests/questions:    Signed, Denilson Salminen   04/28/2022, 9:16 AM

## 2022-04-28 NOTE — Telephone Encounter (Signed)
Patient was last seen in our office by me on 03/12/22 at which time he was stable from a cardiac perspective. Once clearance is received from pharmacy, I would recommend a phone call to assess for any changes.  Emmaline Life, NP-C    04/28/2022, 2:05 PM [place] [address], [address] Office [phone] Fax [phone]

## 2022-04-29 ENCOUNTER — Telehealth: Payer: Self-pay | Admitting: *Deleted

## 2022-04-29 NOTE — Telephone Encounter (Signed)
I s/w the DPR the pt's daughter [name], who interprets for her father who speaks [place]. Tele pre op appt 05/03/22 @ 3 pm. Med rec and consent are done.     Patient Consent for Virtual Visit        [name] has provided verbal consent on 04/29/2022 for a virtual visit (video or telephone).   CONSENT FOR VIRTUAL VISIT FOR:  [name]  By participating in this virtual visit I agree to the following:  I hereby voluntarily request, consent and authorize [facility] and its employed or contracted physicians, physician assistants, nurse practitioners or other licensed health care professionals (the Practitioner), to provide me with telemedicine health care services (the "Services") as deemed necessary by the treating Practitioner. I acknowledge and consent to receive the Services by the Practitioner via telemedicine. I understand that the telemedicine visit will involve communicating with the Practitioner through live audiovisual communication technology and the disclosure of certain medical information by electronic transmission. I acknowledge that I have been given the opportunity to request an in-person assessment or other available alternative prior to the telemedicine visit and am voluntarily participating in the telemedicine visit.  I understand that I have the right to withhold or withdraw my consent to the use of telemedicine in the course of my care at any time, without affecting my right to future care or treatment, and that the Practitioner or I may terminate the telemedicine visit at any time. I understand that I have the right to inspect all information obtained and/or recorded in the course of the telemedicine visit and may receive copies of available information for a reasonable fee.  I understand that some of the potential risks of receiving the Services via telemedicine include:  Delay or interruption in medical evaluation due to technological equipment failure or  disruption; Information transmitted may not be sufficient (e.g. poor resolution of images) to allow for appropriate medical decision making by the Practitioner; and/or  In rare instances, security protocols could fail, causing a breach of personal health information.  Furthermore, I acknowledge that it is my responsibility to provide information about my medical history, conditions and care that is complete and accurate to the best of my ability. I acknowledge that Practitioner's advice, recommendations, and/or decision may be based on factors not within their control, such as incomplete or inaccurate data provided by me or distortions of diagnostic images or specimens that may result from electronic transmissions. I understand that the practice of medicine is not an exact science and that Practitioner makes no warranties or guarantees regarding treatment outcomes. I acknowledge that a copy of this consent can be made available to me via my patient portal ([place]), or I can request a printed copy by calling the office of [place].    I understand that my insurance will be billed for this visit.   I have read or had this consent read to me. I understand the contents of this consent, which adequately explains the benefits and risks of the Services being provided via telemedicine.  I have been provided ample opportunity to ask questions regarding this consent and the Services and have had my questions answered to my satisfaction. I give my informed consent for the services to be provided through the use of telemedicine in my medical care

## 2022-04-29 NOTE — Telephone Encounter (Signed)
Patient with diagnosis of atrial fibrillation on Eliquis for anticoagulation.    Procedure: lumbar spine B/L L5-S1 ESI Date of procedure: TBD   CHA2DS2-VASc Score = 5   This indicates a 7.2% annual risk of stroke. The patient's score is based upon: CHF History: 1 HTN History: 1 Diabetes History: 1 Stroke History: 0 Vascular Disease History: 1 Age Score: 1 Gender Score: 0   CrCl 51 Platelet count 195  Per office protocol, patient can hold Eliquis for 3 days prior to procedure.   Patient will not need bridging with Lovenox (enoxaparin) around procedure.

## 2022-04-29 NOTE — Telephone Encounter (Signed)
I s/w the DPR the pt's daughter [name], who interprets for her father who speaks [place]. Tele pre op appt 05/03/22 @ 3 pm. Med rec and consent are done.

## 2022-04-29 NOTE — Telephone Encounter (Signed)
Primary Cardiologist:Paula Harrington Challenger, MD  Chart reviewed as part of pre-operative protocol coverage. Because of [name]'s past medical history and time since last visit, he/she will require a virtual visit/telephone call in order to better assess preoperative cardiovascular risk.  Pre-op covering staff: - Please contact patient, obtain consent, and schedule appointment   Request regarding holding anticoagulant has been addressed by Pharm D.    Emmaline Life, NP-C    04/29/2022, 12:04 PM [place] [address], [address] Office [phone] Fax [phone]

## 2022-05-03 ENCOUNTER — Ambulatory Visit (INDEPENDENT_AMBULATORY_CARE_PROVIDER_SITE_OTHER): Payer: Medicaid Other | Admitting: General Practice

## 2022-05-03 DIAGNOSIS — Z01810 Encounter for preprocedural cardiovascular examination: Secondary | ICD-10-CM

## 2022-05-03 NOTE — Progress Notes (Signed)
Virtual Visit via Telephone Note   Because of [name]'s co-morbid illnesses, he is at least at moderate risk for complications without adequate follow up.  This format is felt to be most appropriate for this patient at this time.  The patient did not have access to video technology/had technical difficulties with video requiring transitioning to audio format only (telephone).  All issues noted in this document were discussed and addressed.  No physical exam could be performed with this format.  Please refer to the patient's chart for his consent to telehealth for [facility].  Evaluation Performed:  Preoperative cardiovascular risk assessment _____________   Date:  05/03/2022   Patient ID:  [name], DOB 01/01/1946, MRN [id number] Patient Location:  Home Provider location:   Office  Primary Care Provider:  [name], MD Primary Cardiologist:  Stanley Carnes, MD  Chief Complaint / Patient Profile   76 y.o. y/o male with a h/o coronary artery disease, NSVT, paroxysmal atrial fibrillation, hypertension who is pending lumbar spine B/L L5-S1 ESI and presents today for telephonic preoperative cardiovascular risk assessment.  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 per dr Stanley Lei [name] note 06-18-2021   Hematuria 06/18/2021   Hypertension    Sleep apnea    uses cpap   Uses walker 07/24/2021   prn or cane   UTI (urinary tract infection)    finished antibiotics on 07-08-2021   Wound of skin 10/20/2021   sees wound care center for multiple dressing changes   Past Surgical History:  Procedure Laterality Date   CARDIAC  CATHETERIZATION     CYSTOSCOPY W/ URETERAL STENT PLACEMENT Right 07/28/2021   Procedure: CYSTOSCOPY WITH RETROGRADE PYELOGRAM/URETERAL STENT EXCHANGE;  Surgeon: Stanley Haggard, MD;  Location: [place];  Service: Urology;  Laterality: Right;  30 MINS   CYSTOSCOPY W/ URETERAL STENT PLACEMENT Right 10/27/2021   Procedure: CYSTOSCOPY WITH RETROGRADE PYELOGRAM/ URETERAL STENT REMOVAL;  Surgeon: Stanley Haggard, MD;  Location: [facility];  Service: Urology;  Laterality: Right;   CYSTOSCOPY WITH RETROGRADE PYELOGRAM, URETEROSCOPY AND STENT PLACEMENT Right 05/01/2021   Procedure: CYSTOSCOPY WITH RIGHT  RETROGRADE PYELOGRAM,  AND RIGHT STENT PLACEMENT;  Surgeon: Stanley Aloe, MD;  Location: [facility];  Service: Urology;  Laterality: Right;   EYE SURGERY     INJECTION OF SILICONE OIL Left 81/85/6314   Procedure: INJECTION OF SILICONE OIL;  Surgeon: Stanley Stalls, MD;  Location: [place];  Service: Ophthalmology;  Laterality: Left;   INJECTION OF SILICONE OIL Left 97/12/6376   Procedure: INJECTION OF SILICONE OIL;  Surgeon: Stanley Stalls, MD;  Location: [place];  Service: Ophthalmology;  Laterality: Left;   JOINT REPLACEMENT Right    hip   LASER PHOTO ABLATION Left 12/25/2020   Procedure: LASER PHOTO ABLATION;  Surgeon: Stanley Stalls, MD;  Location: [place];  Service: Ophthalmology;  Laterality: Left;   PARS PLANA VITRECTOMY Left 11/10/2020   Procedure: PARS PLANA VITRECTOMY WITH 25 GAUGE;  Surgeon: Stanley Stalls, MD;  Location: [place];  Service: Ophthalmology;  Laterality: Left;  PARS PLANA VITRECTOMY Left 12/25/2020   Procedure: PARS PLANA VITRECTOMY WITH 25 GAUGE IN LEFT EYE;  Surgeon: Stanley Stalls, MD;  Location: [place];  Service: Ophthalmology;  Laterality: Left;   PERFLUORONE INJECTION Left 12/25/2020   Procedure: PERFLUORONE INJECTION;  Surgeon: Stanley Stalls, MD;  Location: [facility];  Service: Ophthalmology;  Laterality: Left;   PHOTOCOAGULATION WITH LASER Left  11/10/2020   Procedure: PHOTOCOAGULATION WITH LASER;  Surgeon: Stanley Stalls, MD;  Location: [place];  Service: Ophthalmology;  Laterality: Left;   removal of picc line     09-2021   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   Procedure: RETINECTOMY LEFT EYE;  Surgeon: Stanley Stalls, MD;  Location: [place];  Service: Ophthalmology;  Laterality: Left;   SILICON OIL REMOVAL Left 13/24/4010   Procedure: SILICONE OIL REMOVAL;  Surgeon: Stanley Stalls, MD;  Location: [place];  Service: Ophthalmology;  Laterality: Left;   TEE WITHOUT CARDIOVERSION N/A 05/29/2021   Procedure: TRANSESOPHAGEAL ECHOCARDIOGRAM (TEE);  Surgeon: Stanley Lean, MD;  Location: [facility];  Service: Cardiovascular;  Laterality: N/A;    Allergies  No Known Allergies  History of Present Illness    [name] is a 76 y.o. male who presents via audio/video conferencing for a telehealth visit today.  Pt was last seen in cardiology clinic on 02/18/2022 by [name].  At that time [name] was noted to have stable lower extremity swelling.  He denied chest pain, breathing was fair, noted to have neuropathic pain in his feet.  The patient is now pending procedure as outlined above. Since his last visit, he remains stable from a cardiac standpoint  Today he denies chest pain, shortness of breath, lower extremity edema, fatigue, palpitations, melena, hematuria, hemoptysis, diaphoresis, weakness, presyncope, syncope, orthopnea, and PND.  Home Medications    Prior to Admission medications   Medication Sig Start Date End Date Taking? Authorizing Provider  acetaminophen (TYLENOL) 650 MG CR tablet Take 650 mg by mouth every 8 (eight) hours as needed for pain or fever (headache).    [provider]  allopurinol (ZYLOPRIM) 300 MG tablet Take 300 mg by mouth at bedtime.    [provider]  amiodarone (PACERONE) 200 MG tablet Take 200 mg by mouth every morning. 04/02/21   [provider]  apixaban (ELIQUIS) 5 MG TABS tablet Take 5 mg by mouth 2 (two) times daily.    [provider]  atorvastatin (LIPITOR) 20 MG tablet Take 20 mg by mouth at bedtime. 11/04/20   [provider]  clopidogrel (PLAVIX) 75 MG tablet Take 75 mg by mouth daily.    [provider]  ferrous sulfate 325 (65 FE) MG EC tablet Take 1 tablet (325 mg total) by mouth daily with breakfast. 02/01/22   Dede Query T, PA-C  finasteride (PROSCAR) 5 MG tablet Take 5 mg by mouth at bedtime. 05/14/21   [provider]  glipiZIDE (GLUCOTROL) 10 MG tablet Take 10 mg by mouth 2 (two) times daily. 05/15/21   [provider]  insulin glargine (LANTUS) 100 unit/mL SOPN Inject 34 Units into the skin at bedtime.    [provider]  isosorbide mononitrate (IMDUR) 30 MG 24 hr tablet Take 30 mg by mouth every morning. 09/08/20   [provider]  metFORMIN (GLUCOPHAGE) 1000 MG tablet Take 1 tablet (1,000 mg total) by mouth daily with breakfast. 01/25/22   Little Ishikawa, MD  metoprolol tartrate (LOPRESSOR) 50 MG tablet Take 1  tablet (50 mg total) by mouth 2 (two) times daily. 05/05/21 04/29/22  Shelly Coss, MD  nitroGLYCERIN (NITROSTAT) 0.3 MG SL tablet Place 0.3 mg under the tongue every 5 (five) minutes x 3 doses as needed for chest pain. 09/08/20   [provider]  oxyCODONE (OXY IR/ROXICODONE) 5 MG immediate release tablet Take 1 tablet (5 mg total) by mouth every 4 (four) hours as needed for severe pain. 04/02/22   Orson Slick, MD  pantoprazole (PROTONIX) 40 MG tablet Take 1 tablet (40 mg total) by mouth 2 (two) times daily before a meal. 01/22/22   Little Ishikawa, MD  prednisoLONE acetate (PRED FORTE) 1 % ophthalmic suspension Place 1 drop into the left eye daily.    [provider]  pregabalin (LYRICA) 50 MG capsule Take 1 capsule (50 mg total) by mouth daily. 02/18/22   Fay Records, MD  silodosin (RAPAFLO) 8 MG CAPS  capsule Take 8 mg by mouth at bedtime.    [provider]  terazosin (HYTRIN) 2 MG capsule Take 2 mg by mouth at bedtime.    [provider]  torsemide (DEMADEX) 20 MG tablet Take 1 tablet (20 mg total) by mouth daily. 03/15/22 03/16/23  Swinyer, Lanice Schwab, NP    Physical Exam    Vital Signs:  [name] does not have vital signs available for review today.  Given telephonic nature of communication, physical exam is limited. AAOx3. NAD. Normal affect.  Speech and respirations are unlabored.  Accessory Clinical Findings    None  Assessment & Plan    1.  Preoperative Cardiovascular Risk Assessment: Lumbar spine B/L L5-S1 ESI; [facility]; Dr. Davy Pique     Primary Cardiologist: Stanley Carnes, MD  Chart reviewed as part of pre-operative protocol coverage. Given past medical history and time since last visit, based on ACC/AHA guidelines, [name] would be at acceptable risk for the planned procedure without further cardiovascular testing.   Patient was advised that if she develops new symptoms prior to surgery to contact our office to arrange a follow-up appointment.  He verbalized understanding.   Patient with diagnosis of atrial fibrillation on Eliquis for anticoagulation.     Procedure: lumbar spine B/L L5-S1 ESI Date of procedure: TBD     CHA2DS2-VASc Score = 5   This indicates a 7.2% annual risk of stroke. The patient's score is based upon: CHF History: 1 HTN History: 1 Diabetes History: 1 Stroke History: 0 Vascular Disease History: 1 Age Score: 1 Gender Score: 0   CrCl 51 Platelet count 195   Per office protocol, patient can hold Eliquis for 3 days prior to procedure.   Patient will not need bridging with Lovenox (enoxaparin) around procedure.   A copy of this note will be routed to requesting surgeon.  Time:   Today, I have spent 12 minutes with the patient with telehealth technology discussing medical  history, symptoms, and management plan.  Prior to his phone evaluation I spent greater than 10 minutes reviewing his past medical history and medications.   Deberah Pelton, NP  05/03/2022, 9:17 AM

## 2022-05-07 ENCOUNTER — Other Ambulatory Visit: Payer: Self-pay

## 2022-05-07 ENCOUNTER — Inpatient Hospital Stay (HOSPITAL_BASED_OUTPATIENT_CLINIC_OR_DEPARTMENT_OTHER): Payer: Medicare HMO | Admitting: Hematology and Oncology

## 2022-05-07 ENCOUNTER — Inpatient Hospital Stay: Payer: Medicare HMO | Attending: Hematology and Oncology

## 2022-05-07 VITALS — BP 153/67 | HR 68 | Temp 97.3°F | Resp 19 | Ht 72.0 in | Wt 264.9 lb

## 2022-05-07 DIAGNOSIS — C8333 Diffuse large B-cell lymphoma, intra-abdominal lymph nodes: Secondary | ICD-10-CM | POA: Insufficient documentation

## 2022-05-07 DIAGNOSIS — G4733 Obstructive sleep apnea (adult) (pediatric): Secondary | ICD-10-CM | POA: Diagnosis not present

## 2022-05-07 DIAGNOSIS — E1122 Type 2 diabetes mellitus with diabetic chronic kidney disease: Secondary | ICD-10-CM | POA: Diagnosis not present

## 2022-05-07 DIAGNOSIS — Z7901 Long term (current) use of anticoagulants: Secondary | ICD-10-CM | POA: Diagnosis not present

## 2022-05-07 DIAGNOSIS — R600 Localized edema: Secondary | ICD-10-CM | POA: Diagnosis not present

## 2022-05-07 DIAGNOSIS — T451X5D Adverse effect of antineoplastic and immunosuppressive drugs, subsequent encounter: Secondary | ICD-10-CM | POA: Diagnosis not present

## 2022-05-07 DIAGNOSIS — I129 Hypertensive chronic kidney disease with stage 1 through stage 4 chronic kidney disease, or unspecified chronic kidney disease: Secondary | ICD-10-CM | POA: Insufficient documentation

## 2022-05-07 DIAGNOSIS — D6481 Anemia due to antineoplastic chemotherapy: Secondary | ICD-10-CM | POA: Diagnosis not present

## 2022-05-07 DIAGNOSIS — C8338 Diffuse large B-cell lymphoma, lymph nodes of multiple sites: Secondary | ICD-10-CM

## 2022-05-07 DIAGNOSIS — I4891 Unspecified atrial fibrillation: Secondary | ICD-10-CM | POA: Insufficient documentation

## 2022-05-07 LAB — CMP (CANCER CENTER ONLY)
ALT: 33 U/L (ref 0–44)
AST: 29 U/L (ref 15–41)
Albumin: 3.7 g/dL (ref 3.5–5.0)
Alkaline Phosphatase: 86 U/L (ref 38–126)
Anion gap: 8 (ref 5–15)
BUN: 22 mg/dL (ref 8–23)
CO2: 26 mmol/L (ref 22–32)
Calcium: 9 mg/dL (ref 8.9–10.3)
Chloride: 107 mmol/L (ref 98–111)
Creatinine: 1.47 mg/dL — ABNORMAL HIGH (ref 0.61–1.24)
GFR, Estimated: 50 mL/min — ABNORMAL LOW (ref >60)
Glucose, Bld: 251 mg/dL — ABNORMAL HIGH (ref 70–99)
Potassium: 4 mmol/L (ref 3.5–5.1)
Sodium: 141 mmol/L (ref 135–145)
Total Bilirubin: 0.4 mg/dL (ref 0.3–1.2)
Total Protein: 5.4 g/dL — ABNORMAL LOW (ref 6.5–8.1)

## 2022-05-07 LAB — CBC WITH DIFFERENTIAL (CANCER CENTER ONLY)
Abs Immature Granulocytes: 0.02 10*3/uL (ref 0.00–0.07)
Basophils Absolute: 0 10*3/uL (ref 0.0–0.1)
Basophils Relative: 0 %
Eosinophils Absolute: 0.2 10*3/uL (ref 0.0–0.5)
Eosinophils Relative: 3 %
HCT: 33.1 % — ABNORMAL LOW (ref 39.0–52.0)
Hemoglobin: 10.9 g/dL — ABNORMAL LOW (ref 13.0–17.0)
Immature Granulocytes: 0 %
Lymphocytes Relative: 22 %
Lymphs Abs: 1.4 10*3/uL (ref 0.7–4.0)
MCH: 24.5 pg — ABNORMAL LOW (ref 26.0–34.0)
MCHC: 32.9 g/dL (ref 30.0–36.0)
MCV: 74.4 fL — ABNORMAL LOW (ref 80.0–100.0)
Monocytes Absolute: 0.6 10*3/uL (ref 0.1–1.0)
Monocytes Relative: 9 %
Neutro Abs: 4.1 10*3/uL (ref 1.7–7.7)
Neutrophils Relative %: 66 %
Platelet Count: 184 10*3/uL (ref 150–400)
RBC: 4.45 MIL/uL (ref 4.22–5.81)
RDW: 18.1 % — ABNORMAL HIGH (ref 11.5–15.5)
WBC Count: 6.4 10*3/uL (ref 4.0–10.5)
nRBC: 0 % (ref 0.0–0.2)

## 2022-05-07 LAB — LACTATE DEHYDROGENASE: LDH: 193 U/L — ABNORMAL HIGH (ref 98–192)

## 2022-05-11 ENCOUNTER — Encounter: Payer: Self-pay | Admitting: Hematology and Oncology

## 2022-06-07 ENCOUNTER — Other Ambulatory Visit: Payer: Self-pay

## 2022-06-15 ENCOUNTER — Encounter: Payer: Self-pay | Admitting: Hematology and Oncology

## 2022-06-15 ENCOUNTER — Other Ambulatory Visit: Payer: Self-pay

## 2022-06-21 ENCOUNTER — Ambulatory Visit (INDEPENDENT_AMBULATORY_CARE_PROVIDER_SITE_OTHER): Payer: Medicare HMO | Admitting: Internal Medicine

## 2022-06-21 ENCOUNTER — Encounter: Payer: Self-pay | Admitting: Internal Medicine

## 2022-06-21 VITALS — BP 132/60 | HR 66 | Ht 70.87 in | Wt 266.2 lb

## 2022-06-21 DIAGNOSIS — E1159 Type 2 diabetes mellitus with other circulatory complications: Secondary | ICD-10-CM | POA: Diagnosis not present

## 2022-06-21 DIAGNOSIS — I480 Paroxysmal atrial fibrillation: Secondary | ICD-10-CM

## 2022-06-21 DIAGNOSIS — Z79899 Other long term (current) drug therapy: Secondary | ICD-10-CM

## 2022-06-21 DIAGNOSIS — I152 Hypertension secondary to endocrine disorders: Secondary | ICD-10-CM

## 2022-06-21 DIAGNOSIS — E785 Hyperlipidemia, unspecified: Secondary | ICD-10-CM

## 2022-06-21 DIAGNOSIS — I2510 Atherosclerotic heart disease of native coronary artery without angina pectoris: Secondary | ICD-10-CM

## 2022-06-21 DIAGNOSIS — E1169 Type 2 diabetes mellitus with other specified complication: Secondary | ICD-10-CM

## 2022-06-21 DIAGNOSIS — I5043 Acute on chronic combined systolic (congestive) and diastolic (congestive) heart failure: Secondary | ICD-10-CM

## 2022-06-21 NOTE — Patient Instructions (Signed)
Medication Instructions:   *If you need a refill on your cardiac medications before your next appointment, please call your pharmacy*   Lab Work:  BMET, PRO BNP, AND CBC  If you have labs (blood work) drawn today and your tests are completely normal, you will receive your results only by: [place] (if you have MyChart) OR A paper copy in the mail If you have any lab test that is abnormal or we need to change your treatment, we will call you to review the results.   Testing/Procedures:    Follow-Up: At [facility], you and your health needs are our priority.  As part of our continuing mission to provide you with exceptional heart care, we have created designated Provider Care Teams.  These Care Teams include your primary Cardiologist (physician) and Advanced Practice Providers (APPs -  Physician Assistants and Nurse Practitioners) who all work together to provide you with the care you need, when you need it.  We recommend signing up for the patient portal called "MyChart".  Sign up information is provided on this After Visit Summary.  MyChart is used to connect with patients for Virtual Visits (Telemedicine).  Patients are able to view lab/test results, encounter notes, upcoming appointments, etc.  Non-urgent messages can be sent to your provider as well.   To learn more about what you can do with MyChart, go to [email or web address].     Important Information About Sugar

## 2022-06-21 NOTE — Progress Notes (Signed)
Cardiology Office Note   Date:  06/21/2022   ID:  [name], DOB 05-06-1946, MRN [id number]  PCP:  [name], MD  Cardiologist:   Dorris Carnes, MD   Pt presents for follow up of LE edema      History of Present Illness: [name] is a 76 y.o. 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  Pt sent home without diuretic on 3/10. The pt was seen in ED on 3/17 with edema   Placed on lasix 20 I saw the pt in April 2023   Switched diuretic to torsemide    The pt was seen by Elwyn Reach in April and then by Thomasene Mohair for preop evaluation in June   The pt denies CP   Breathing is stable   Still with LE edema    Not clear if watching/limiting salt     Denies palpitaitons     Current Meds  Medication Sig   acetaminophen (TYLENOL) 650 MG CR tablet Take 650 mg by mouth every 8 (eight) hours as needed for pain or fever (headache).   allopurinol (ZYLOPRIM) 300 MG tablet Take 300 mg by mouth at bedtime.   amiodarone (PACERONE) 200 MG tablet Take 200 mg by mouth every morning.   amLODipine (NORVASC) 10 MG tablet Take 10 mg by mouth daily.   apixaban (ELIQUIS) 5 MG TABS tablet Take 5 mg by mouth 2 (two) times daily.   atorvastatin (LIPITOR) 80 MG tablet Take 80 mg by mouth daily.   ferrous sulfate 325 (65 FE) MG EC tablet Take 1 tablet (325 mg total) by mouth daily with breakfast.   finasteride (PROSCAR) 5 MG tablet Take 5 mg by mouth at bedtime.   glipiZIDE (GLUCOTROL) 10 MG tablet Take 10 mg by mouth 2 (two) times daily.   insulin glargine (LANTUS) 100 unit/mL SOPN Inject 34 Units into the skin at bedtime.   metFORMIN (GLUCOPHAGE) 1000 MG  tablet Take 1 tablet (1,000 mg total) by mouth daily with breakfast.   metoprolol tartrate (LOPRESSOR) 50 MG tablet Take 1 tablet (50 mg total) by mouth 2 (two) times daily.   nitroGLYCERIN (NITROSTAT) 0.3 MG SL tablet Place 0.3 mg under the tongue every 5 (five) minutes x 3 doses as needed for chest pain.   oxyCODONE (OXY IR/ROXICODONE) 5 MG immediate release tablet Take 1 tablet (5 mg total) by mouth every 4 (four) hours as needed for severe pain.   prednisoLONE acetate (PRED FORTE) 1 % ophthalmic suspension Place 1 drop into the left eye daily.   terazosin (HYTRIN) 2 MG capsule Take 2 mg by mouth at bedtime.   torsemide (DEMADEX) 20 MG tablet Take 1 tablet (20 mg total) by mouth daily.     Allergies:   Patient has no known allergies.   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 per dr Lucianne Lei dam note 06-18-2021   Hematuria 06/18/2021   Hypertension    Sleep apnea    uses cpap   Uses walker 07/24/2021   prn or cane   UTI (urinary tract infection)    finished antibiotics on 07-08-2021   Wound of skin 10/20/2021   sees wound care center for multiple dressing changes    Past Surgical History:  Procedure Laterality Date   CARDIAC CATHETERIZATION     CYSTOSCOPY W/ URETERAL STENT PLACEMENT Right 07/28/2021   Procedure: CYSTOSCOPY WITH RETROGRADE PYELOGRAM/URETERAL STENT EXCHANGE;  Surgeon: [name], MD;  [facility];  Service: Urology;  Laterality: Right;  30 MINS   CYSTOSCOPY W/ URETERAL STENT PLACEMENT Right 10/27/2021   Procedure: CYSTOSCOPY WITH RETROGRADE PYELOGRAM/ URETERAL STENT REMOVAL;  Surgeon: [name], MD;  Location: [facility];  Service: Urology;  Laterality: Right;   CYSTOSCOPY WITH RETROGRADE PYELOGRAM, URETEROSCOPY AND STENT PLACEMENT Right 05/01/2021   Procedure: CYSTOSCOPY WITH RIGHT  RETROGRADE PYELOGRAM,  AND RIGHT STENT PLACEMENT;  Surgeon: Festus Aloe, MD;  Location[facility];  Service: Urology;  Laterality: Right;   EYE SURGERY     INJECTION OF SILICONE OIL Left 97/35/3299   Procedure: INJECTION OF SILICONE OIL;  Surgeon: Sherlynn Stalls, MD;  Location: [place];  Service: Ophthalmology;  Laterality: Left;   INJECTION OF SILICONE OIL Left 24/26/8341   Procedure: INJECTION OF SILICONE OIL;  Surgeon: Sherlynn Stalls, MD;  Location: [place];  Service: Ophthalmology;  Laterality: Left;   JOINT REPLACEMENT Right    hip   LASER PHOTO ABLATION Left 12/25/2020   Procedure: LASER PHOTO ABLATION;  Surgeon: Sherlynn Stalls, MD;  Location: [place];  Service: Ophthalmology;  Laterality: Left;   PARS PLANA VITRECTOMY Left 11/10/2020   Procedure: PARS PLANA VITRECTOMY WITH 25 GAUGE;  Surgeon: Sherlynn Stalls, MD;  Location: [place];  Service: Ophthalmology;  Laterality: Left;   PARS PLANA VITRECTOMY Left 12/25/2020   Procedure: PARS PLANA VITRECTOMY WITH 25 GAUGE IN LEFT EYE;  Surgeon: Sherlynn Stalls, MD;  Location: [place];  Service: Ophthalmology;  Laterality: Left;   PERFLUORONE INJECTION Left 12/25/2020   Procedure: PERFLUORONE INJECTION;  Surgeon: Sherlynn Stalls, MD;  Location: [place];  Service: Ophthalmology;  Laterality: Left;   PHOTOCOAGULATION WITH LASER Left 11/10/2020   Procedure: PHOTOCOAGULATION WITH LASER;  Surgeon: Sherlynn Stalls, MD;  Location: [place];  Service: Ophthalmology;  Laterality: Left;   removal of picc line     09-2021   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   Procedure: RETINECTOMY LEFT EYE;  Surgeon: Sherlynn Stalls, MD;  Location: [place];  Service: Ophthalmology;  Laterality: Left;   SILICON OIL REMOVAL Left 96/22/2979   Procedure: SILICONE OIL REMOVAL;   Surgeon: Sherlynn Stalls, MD;  Location: [place];  Service: Ophthalmology;  Laterality: Left;   TEE WITHOUT CARDIOVERSION N/A 05/29/2021   Procedure: TRANSESOPHAGEAL ECHOCARDIOGRAM (TEE);  Surgeon: Werner Lean, MD;  Location: [facility];  Service: Cardiovascular;  Laterality: N/A;     Social History:  The patient  reports that he has never smoked. He has never used smokeless tobacco. He reports that he does not currently use alcohol. He reports that he does not use drugs.   Family History:  The patient's family history includes CAD  in an other family member.    ROS:  Please see the history of present illness. All other systems are reviewed and  Negative to the above problem except as noted.    PHYSICAL EXAM: VS:  BP 132/60   Pulse 66   Ht 5' 10.87" (1.8 m)   Wt 266 lb 3.2 oz (120.7 kg)   SpO2 97%   BMI 37.27 kg/m   GEN: Well nourished, well developed, in no acute distress  HEENT: normal  Neck: no JVD, carotid bruits Cardiac: RRR; II/VI systolic murmur LSB   2+ LE  edema  Respiratory:  clear to auscultation bilaterally, normal work of breathing GI: soft, nontender, nondistended, + BS  No hepatomegaly  Ext  1-2+ edema   Legs soft   Skin: warm and dry, no rash Neuro:  Deferred   Psych: euthymic mood, full affect   EKG:  EKG is not ordered today.  Chest CT   04/20/22  1. Stable examination without new or progressive finding in the chest, abdomen or pelvis. 2. Unchanged mild splenomegaly, soft tissue about the right renal pelvis and mesenteric stranding likely reflecting treated disease. 3. 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 Attention on follow-up oncologic imaging in this patient on surveillance imaging is suggested. Aortic aneurysm NOS (ICD10-I71.9). 4.  Aortic Atherosclerosis (ICD10-I70.0).    Echo   01/18/22    Left ventricular ejection fraction, by estimation, is 55 to 60%. The left ventricle has normal function. Left  ventricular endocardial border not optimally defined to evaluate regional wall motion. Left ventricular diastolic parameters are consistent with Grade I diastolic dysfunction (impaired relaxation). 1. Right ventricular systolic function is normal. The right ventricular size is normal. Tricuspid regurgitation signal is inadequate for assessing PA pressure. 2. 3. Left atrial size was mild to moderately dilated. 4. Right atrial size was mild to moderately dilated. The mitral valve is normal in structure. No evidence of mitral valve regurgitation. No evidence of mitral stenosis. 5. The aortic valve is tricuspid. There is moderate calcification of the aortic valve. Aortic valve regurgitation is not visualized. Mild aortic valve stenosis. Aortic valve mean gradient measures 13.0 mmHg. 6. Aortic dilatation noted. 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 7. The inferior vena cava is normal in size with greater than 50% respiratory variability, suggesting right atrial pressure of 3 mmHg. 8. 9. Technically difficult study with poor acoustic windows.Lipid Panel No results found for: "CHOL", "TRIG", "HDL", "CHOLHDL", "VLDL", "LDLCALC", "LDLDIRECT"    Wt Readings from Last 3 Encounters:  06/21/22 266 lb 3.2 oz (120.7 kg)  05/07/22 264 lb 14.4 oz (120.2 kg)  03/12/22 267 lb 3.2 oz (121.2 kg)      ASSESSMENT AND PLAN:  1  Acute on chronic diastolic CHF    Pt continues to have LE edema  Discussed limiting Na    Will get BMET and BNP (as well as CBC today)    2   CAD   No symptoms of angina  3  HTN  BP is fair control today     4  TAA   Most recent CT in June 2023 aorta was 4.4 cm   Conitnue to follow     5  PAF   Clinically in SR   Continue Amiodaorne and ELilquis     6  AS  Mild on echo   Mean gradient 13 mm Hg     7 DM   with neuropathy  8   HL  LDL in Jan 2023 was 19     F/U in 3 wks     Current medicines are reviewed at length with the  patient today.  The patient does not have concerns regarding medicines.  Signed, Dorris Carnes, MD  06/21/2022 4:15 PM    [facility] [address], [place], [place]  [address] Phone: [phone]; Fax: [phone]

## 2022-06-22 ENCOUNTER — Encounter: Payer: Self-pay | Admitting: Hematology and Oncology

## 2022-06-22 LAB — CBC
Hematocrit: 37.4 % — ABNORMAL LOW (ref 37.5–51.0)
Hemoglobin: 12 g/dL — ABNORMAL LOW (ref 13.0–17.7)
MCH: 25.6 pg — ABNORMAL LOW (ref 26.6–33.0)
MCHC: 32.1 g/dL (ref 31.5–35.7)
MCV: 80 fL (ref 79–97)
Platelets: 203 10*3/uL (ref 150–450)
RBC: 4.69 x10E6/uL (ref 4.14–5.80)
RDW: 16.5 % — ABNORMAL HIGH (ref 11.6–15.4)
WBC: 10.2 10*3/uL (ref 3.4–10.8)

## 2022-06-22 LAB — BASIC METABOLIC PANEL
BUN/Creatinine Ratio: 21 (ref 10–24)
BUN: 39 mg/dL — ABNORMAL HIGH (ref 8–27)
CO2: 20 mmol/L (ref 20–29)
Calcium: 9.4 mg/dL (ref 8.6–10.2)
Chloride: 103 mmol/L (ref 96–106)
Creatinine, Ser: 1.86 mg/dL — ABNORMAL HIGH (ref 0.76–1.27)
Glucose: 324 mg/dL — ABNORMAL HIGH (ref 70–99)
Potassium: 5.1 mmol/L (ref 3.5–5.2)
Sodium: 142 mmol/L (ref 134–144)
eGFR: 38 mL/min/{1.73_m2} — ABNORMAL LOW (ref >59)

## 2022-06-22 LAB — PRO B NATRIURETIC PEPTIDE: NT-Pro BNP: 1311 pg/mL — ABNORMAL HIGH (ref 0–376)

## 2022-06-23 ENCOUNTER — Telehealth: Payer: Self-pay

## 2022-06-23 DIAGNOSIS — Z79899 Other long term (current) drug therapy: Secondary | ICD-10-CM

## 2022-06-23 DIAGNOSIS — I5043 Acute on chronic combined systolic (congestive) and diastolic (congestive) heart failure: Secondary | ICD-10-CM

## 2022-06-23 NOTE — Telephone Encounter (Signed)
Repeat labs 08/04/22.

## 2022-06-23 NOTE — Telephone Encounter (Signed)
-----   Message from Dorris Carnes V, MD sent at 06/23/2022 11:12 AM EDT ----- CBC   Hgb is better than one month ago Fluid is up some  But Cr is higher than it had been a month ago.     Glucose also very high 324   I would not change diuretic dose    I would reocmm BMET in 6 wks      Pt needs tighter control of sugars     Needs to really watch diet  Lmit salt  Limit sugars , carbs.   May need to go up on diabetic meds  Please forward to Dr Lockwood Sink

## 2022-07-24 IMAGING — DX DG CHEST 1V PORT
1 series · 1 of 1 positions shown · non-contrast
Comparison: Chest x-ray 05/23/2021.

CLINICAL DATA: Shortness of breath.

EXAM:
PORTABLE CHEST 1 VIEW

[chest]
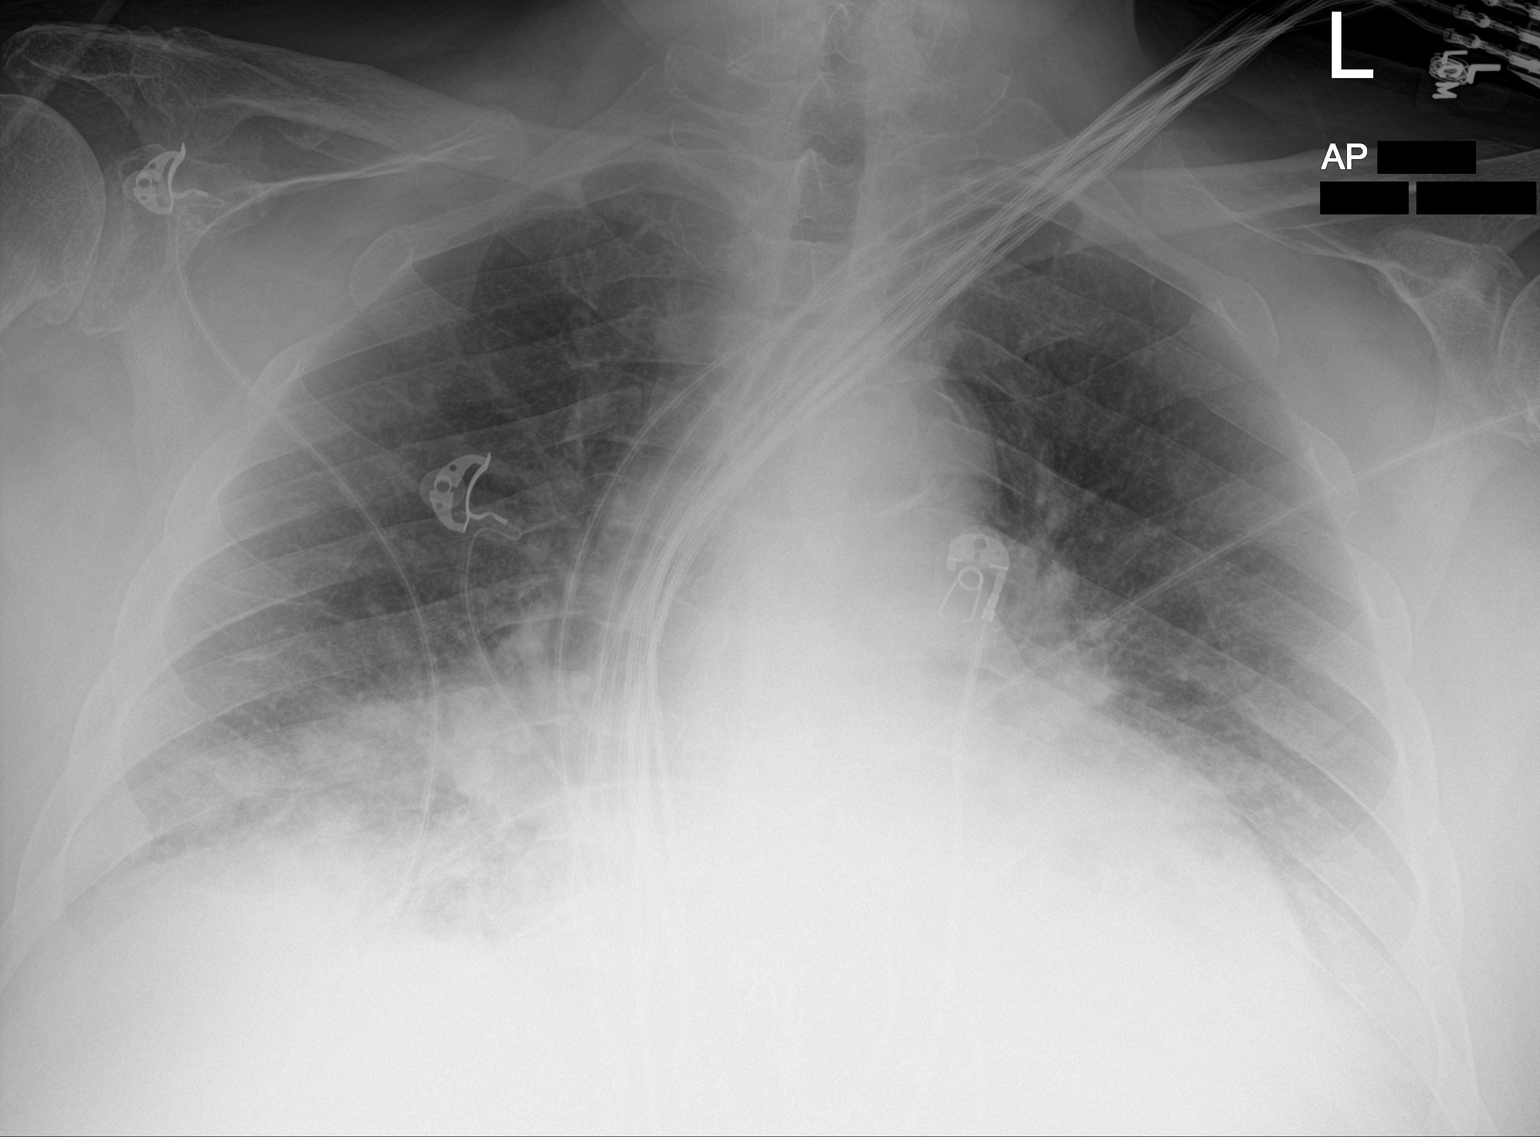

[1 of 1 positions shown; findings below may reference images not displayed]

FINDINGS: The heart is enlarged. There are patchy airspace opacities in both
lung bases. No large pleural effusion or pneumothorax. No acute
fractures.
IMPRESSION: 1. Bilateral lower lobe airspace disease worrisome for multifocal
pneumonia and/or edema.
2. Stable cardiomegaly.

## 2022-07-26 IMAGING — DX DG CHEST 1V PORT
1 series · 2 of 2 positions shown · non-contrast
Comparison: 01/17/2022

CLINICAL DATA: Dyspnea

EXAM:
PORTABLE CHEST 1 VIEW

[Series 1: chest · 0.14mm/px · 2 of 2 slices shown]
[im 1/2]
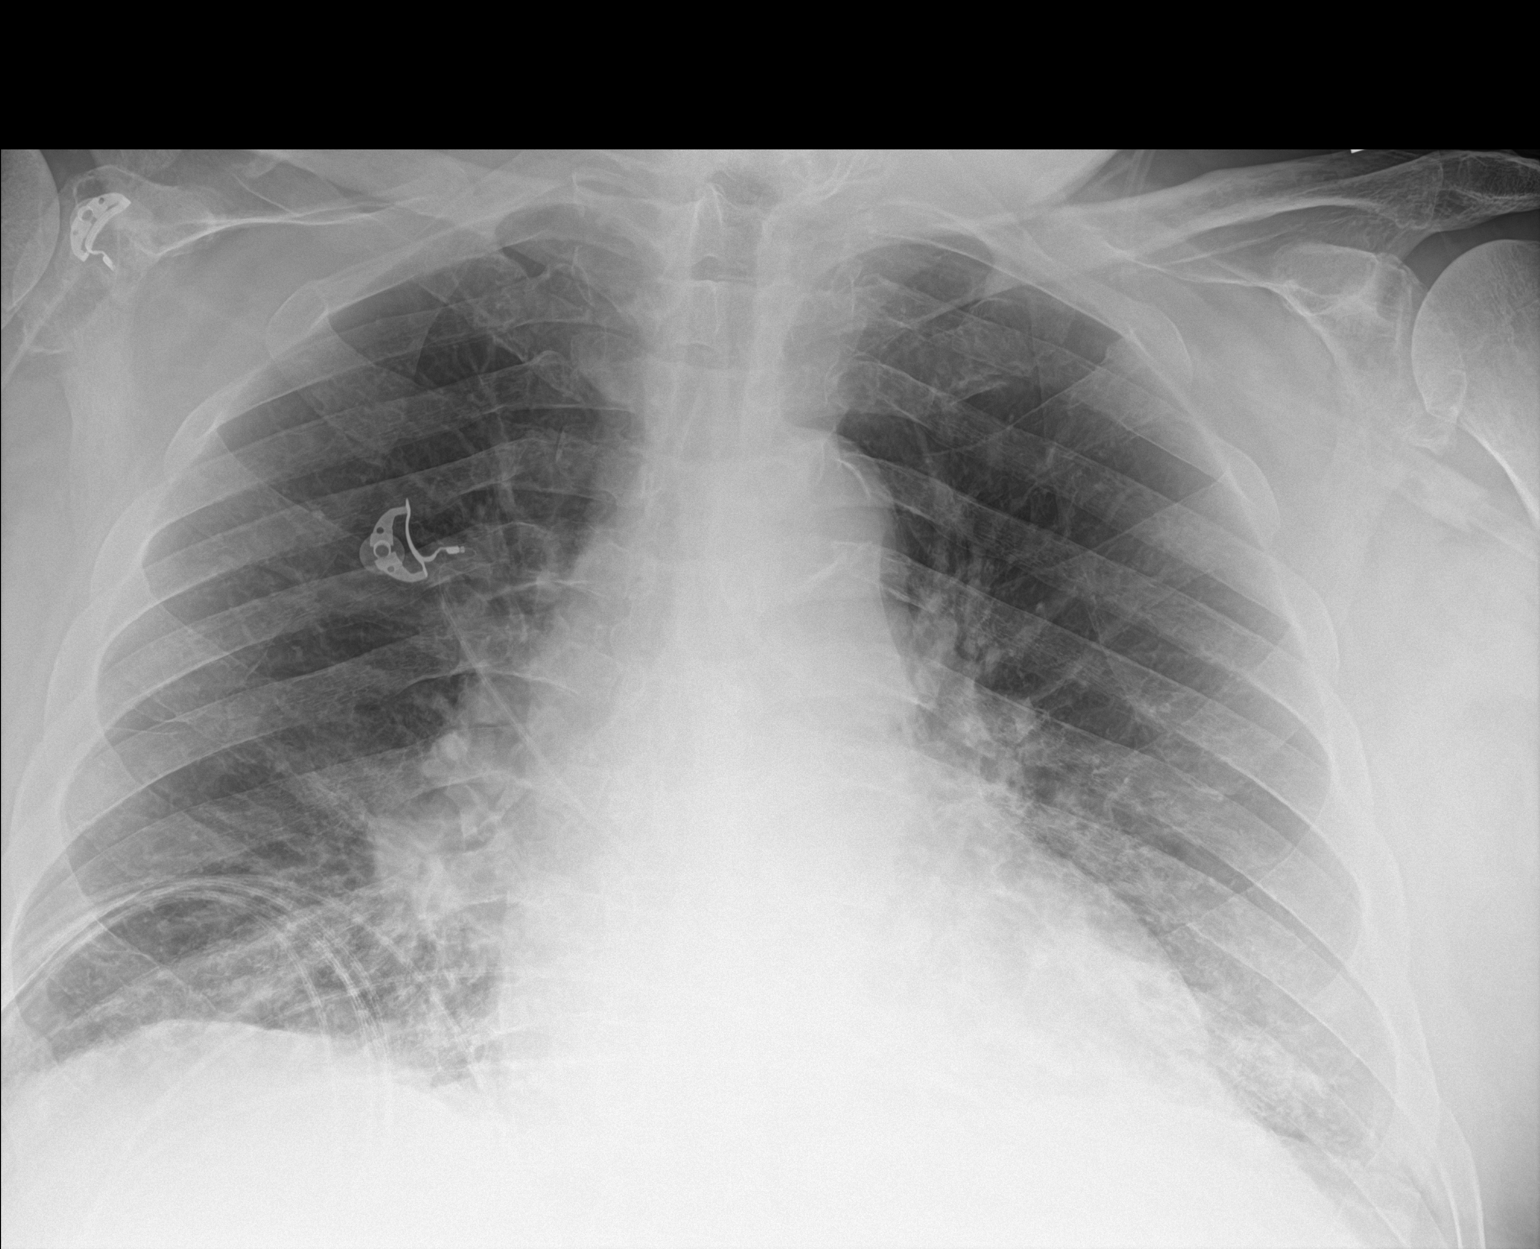
[im 2/2]
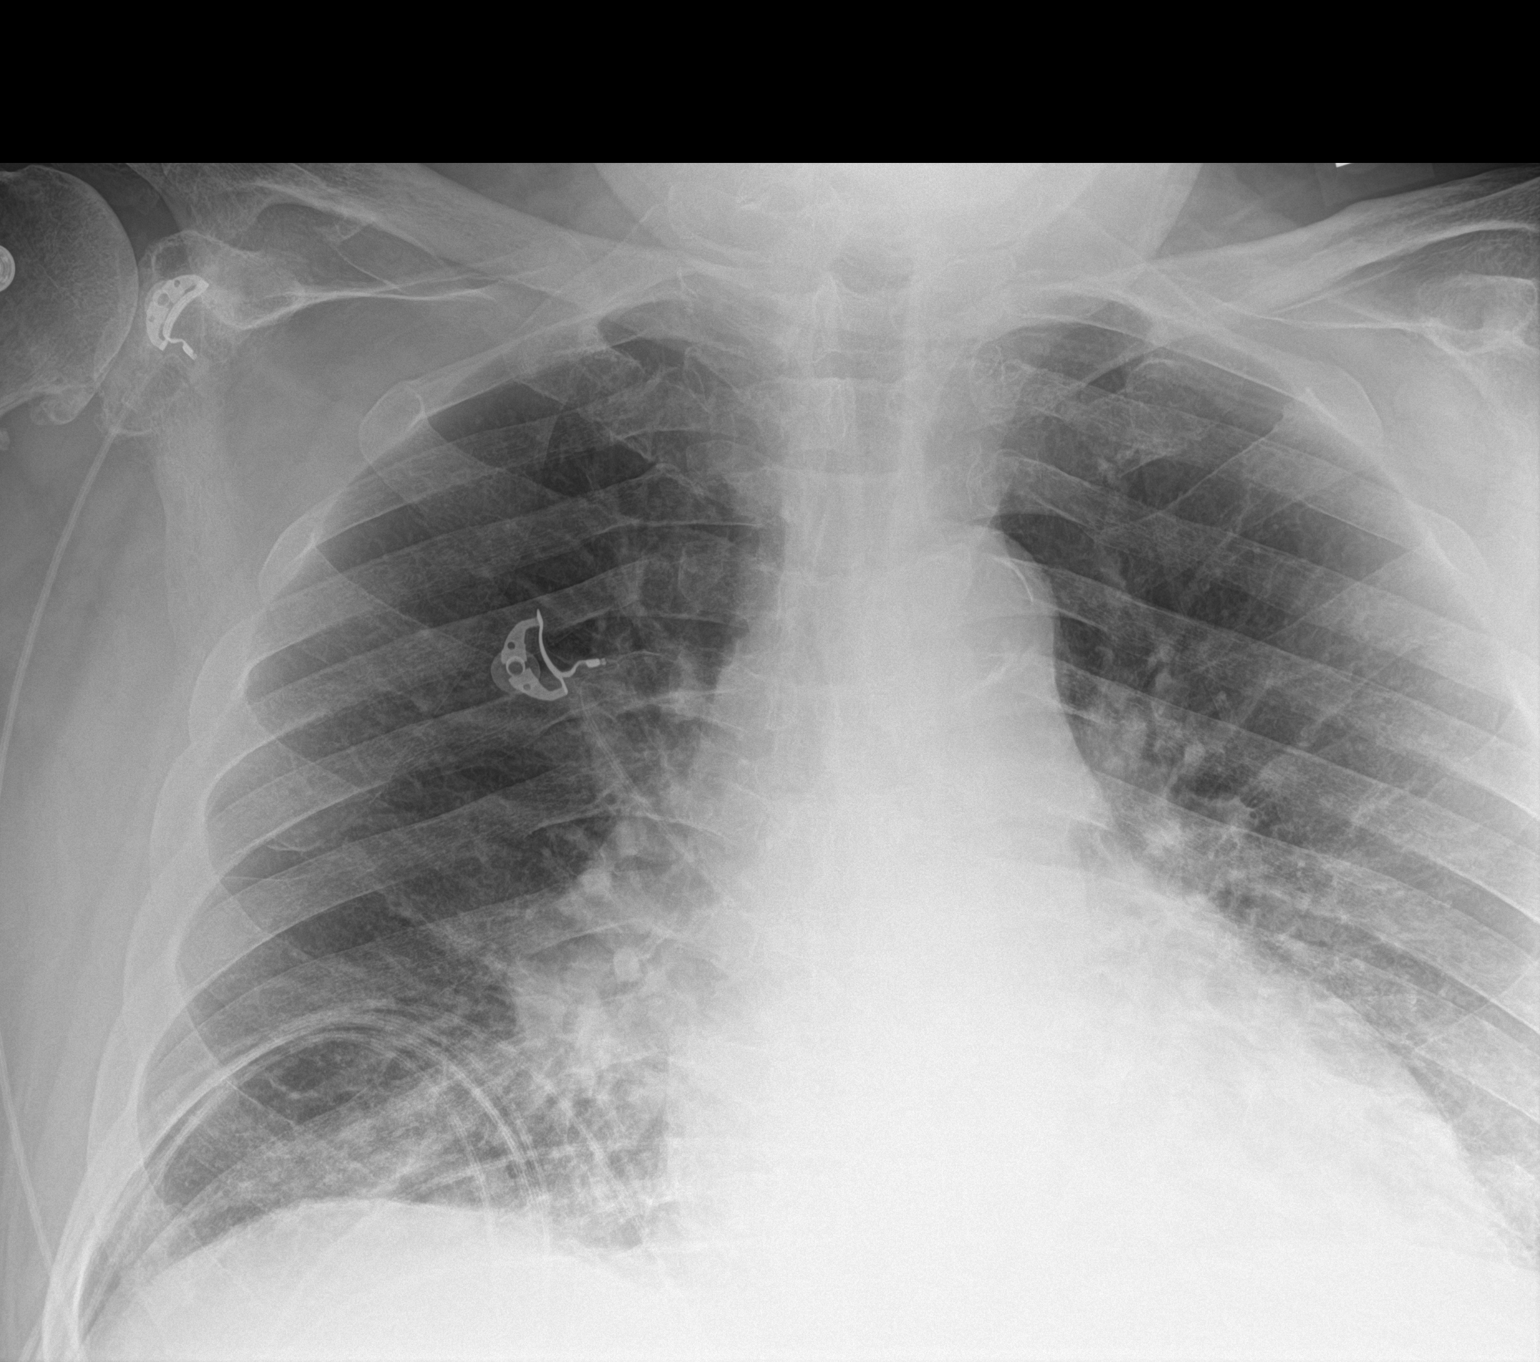

[2 of 2 positions shown; findings below may reference images not displayed]

FINDINGS: Midline trachea. Cardiomegaly accentuated by AP portable technique.
Atherosclerosis in the transverse aorta. No pleural effusion or
pneumothorax. Bibasilar airspace disease is felt to be slightly
improved. The upper lungs are clear.
IMPRESSION: Improvement in bibasilar airspace disease/pneumonia.

Cardiomegaly.

Aortic Atherosclerosis ([id number]).

## 2022-08-04 ENCOUNTER — Ambulatory Visit: Payer: Medicare HMO | Attending: Internal Medicine

## 2022-08-04 DIAGNOSIS — Z79899 Other long term (current) drug therapy: Secondary | ICD-10-CM

## 2022-08-04 DIAGNOSIS — I5043 Acute on chronic combined systolic (congestive) and diastolic (congestive) heart failure: Secondary | ICD-10-CM

## 2022-08-04 LAB — BASIC METABOLIC PANEL
BUN/Creatinine Ratio: 14 (ref 10–24)
BUN: 22 mg/dL (ref 8–27)
CO2: 25 mmol/L (ref 20–29)
Calcium: 8.6 mg/dL (ref 8.6–10.2)
Chloride: 100 mmol/L (ref 96–106)
Creatinine, Ser: 1.55 mg/dL — ABNORMAL HIGH (ref 0.76–1.27)
Glucose: 301 mg/dL — ABNORMAL HIGH (ref 70–99)
Potassium: 4.4 mmol/L (ref 3.5–5.2)
Sodium: 139 mmol/L (ref 134–144)
eGFR: 46 mL/min/{1.73_m2} — ABNORMAL LOW (ref >59)

## 2022-08-06 ENCOUNTER — Encounter: Payer: Self-pay | Admitting: Hematology and Oncology

## 2022-08-06 ENCOUNTER — Telehealth: Payer: Self-pay | Admitting: *Deleted

## 2022-08-06 ENCOUNTER — Inpatient Hospital Stay: Payer: Medicare HMO

## 2022-08-06 ENCOUNTER — Inpatient Hospital Stay: Payer: Medicare HMO | Admitting: Hematology and Oncology

## 2022-08-06 ENCOUNTER — Other Ambulatory Visit: Payer: Self-pay | Admitting: Hematology and Oncology

## 2022-08-06 DIAGNOSIS — C8338 Diffuse large B-cell lymphoma, lymph nodes of multiple sites: Secondary | ICD-10-CM

## 2022-08-06 NOTE — Telephone Encounter (Signed)
TCT patient's daughter, Lidia,in response to her MY Chart message. She sent a picture of her father's leg. He sustained a fall while in [place] and scraped his leg. He had been swimming in the ocean and in the pool where they were staying. She states he has not felt good since his return from [place]. He had a fever last night >102 and today 101.7 She is asking what they should do. He has an appt here today. After discussing with Dr. Linton Rump that her father should go to the ED as he may have a systemic infection given his fevers. He will blood and wound cultures. She states that her father is reluctant to go to the ED due to the cost. Advised that his situation is more than can be handled in this clinic. She voiced understanding and will inform her parents of Dr. Libby Maw recommendations. Appts cancelled for today.

## 2022-08-13 ENCOUNTER — Other Ambulatory Visit: Payer: Self-pay

## 2022-08-13 ENCOUNTER — Inpatient Hospital Stay: Payer: Medicare HMO | Attending: Hematology and Oncology

## 2022-08-13 ENCOUNTER — Inpatient Hospital Stay (HOSPITAL_BASED_OUTPATIENT_CLINIC_OR_DEPARTMENT_OTHER): Payer: Medicare HMO | Admitting: Physician Assistant

## 2022-08-13 ENCOUNTER — Ambulatory Visit (HOSPITAL_COMMUNITY)
Admission: RE | Admit: 2022-08-13 | Discharge: 2022-08-13 | Disposition: A | Discharge status: Home / Self Care | Payer: Medicare HMO | Source: Ambulatory Visit | Attending: Physician Assistant | Admitting: Physician Assistant

## 2022-08-13 VITALS — BP 150/76 | HR 73 | Temp 97.5°F | Resp 16 | Ht 70.0 in | Wt 259.9 lb

## 2022-08-13 DIAGNOSIS — E1165 Type 2 diabetes mellitus with hyperglycemia: Secondary | ICD-10-CM | POA: Diagnosis not present

## 2022-08-13 DIAGNOSIS — D509 Iron deficiency anemia, unspecified: Secondary | ICD-10-CM | POA: Diagnosis not present

## 2022-08-13 DIAGNOSIS — M25422 Effusion, left elbow: Secondary | ICD-10-CM | POA: Insufficient documentation

## 2022-08-13 DIAGNOSIS — R609 Edema, unspecified: Secondary | ICD-10-CM | POA: Insufficient documentation

## 2022-08-13 DIAGNOSIS — C8338 Diffuse large B-cell lymphoma, lymph nodes of multiple sites: Secondary | ICD-10-CM | POA: Diagnosis not present

## 2022-08-13 DIAGNOSIS — Z8572 Personal history of non-Hodgkin lymphomas: Secondary | ICD-10-CM | POA: Insufficient documentation

## 2022-08-13 LAB — CMP (CANCER CENTER ONLY)
ALT: 57 U/L — ABNORMAL HIGH (ref 0–44)
AST: 43 U/L — ABNORMAL HIGH (ref 15–41)
Albumin: 3.8 g/dL (ref 3.5–5.0)
Alkaline Phosphatase: 110 U/L (ref 38–126)
Anion gap: 5 (ref 5–15)
BUN: 19 mg/dL (ref 8–23)
CO2: 28 mmol/L (ref 22–32)
Calcium: 8.7 mg/dL — ABNORMAL LOW (ref 8.9–10.3)
Chloride: 106 mmol/L (ref 98–111)
Creatinine: 1.19 mg/dL (ref 0.61–1.24)
GFR, Estimated: >60 mL/min (ref >60)
Glucose, Bld: 282 mg/dL — ABNORMAL HIGH (ref 70–99)
Potassium: 4.3 mmol/L (ref 3.5–5.1)
Sodium: 139 mmol/L (ref 135–145)
Total Bilirubin: 0.5 mg/dL (ref 0.3–1.2)
Total Protein: 5.7 g/dL — ABNORMAL LOW (ref 6.5–8.1)

## 2022-08-13 LAB — CBC WITH DIFFERENTIAL (CANCER CENTER ONLY)
Abs Immature Granulocytes: 0.04 10*3/uL (ref 0.00–0.07)
Basophils Absolute: 0 10*3/uL (ref 0.0–0.1)
Basophils Relative: 0 %
Eosinophils Absolute: 0.1 10*3/uL (ref 0.0–0.5)
Eosinophils Relative: 2 %
HCT: 34 % — ABNORMAL LOW (ref 39.0–52.0)
Hemoglobin: 11.6 g/dL — ABNORMAL LOW (ref 13.0–17.0)
Immature Granulocytes: 1 %
Lymphocytes Relative: 24 %
Lymphs Abs: 1.8 10*3/uL (ref 0.7–4.0)
MCH: 27.2 pg (ref 26.0–34.0)
MCHC: 34.1 g/dL (ref 30.0–36.0)
MCV: 79.6 fL — ABNORMAL LOW (ref 80.0–100.0)
Monocytes Absolute: 0.5 10*3/uL (ref 0.1–1.0)
Monocytes Relative: 7 %
Neutro Abs: 5 10*3/uL (ref 1.7–7.7)
Neutrophils Relative %: 66 %
Platelet Count: 188 10*3/uL (ref 150–400)
RBC: 4.27 MIL/uL (ref 4.22–5.81)
RDW: 15.4 % (ref 11.5–15.5)
WBC Count: 7.5 10*3/uL (ref 4.0–10.5)
nRBC: 0 % (ref 0.0–0.2)

## 2022-08-13 LAB — LACTATE DEHYDROGENASE: LDH: 230 U/L — ABNORMAL HIGH (ref 98–192)

## 2022-08-16 ENCOUNTER — Telehealth: Payer: Self-pay

## 2022-08-16 ENCOUNTER — Telehealth: Payer: Self-pay | Admitting: *Deleted

## 2022-08-16 ENCOUNTER — Encounter: Payer: Self-pay | Admitting: Hematology and Oncology

## 2022-08-16 NOTE — Telephone Encounter (Signed)
   Pre-operative Risk Assessment    Patient Name: [name]  DOB: Feb 09, 1946 MRN: [id number]      Request for Surgical Clearance    Procedure:   RETINAL SURGERY  Date of Surgery:  Clearance 08/19/22                                 Surgeon:  Sherlynn Stalls, MD Surgeon's Group or Practice Name:  [facility]  Phone number:  [phone] Fax number:  [phone]   Type of Clearance Requested:   - Medical  - Pharmacy:  Hold Clopidogrel (Plavix) and Apixaban (Eliquis) WASN'T INDICATED ON CLEARANCE    Type of Anesthesia:  MAC   Additional requests/questions:    Astrid Divine   08/16/2022, 9:28 AM

## 2022-08-16 NOTE — Progress Notes (Signed)
[place] Telephone:(336) [phone]   [phone]  PROGRESS NOTE  Patient Care Team: [name], MD as PCP - General (Family Medicine) Fay Records, MD as PCP - Cardiology (Cardiology) [name], MD (Family Medicine)  Hematological/Oncological History # Diffuse Large B Cell Lymphoma, Stage III/IV 04/30/2021: presented with RLQ abdominal pain. CT abdomen/pelvis shows pathologic mesenteric, retroperitoneal, and pelvic adenopathy, as described above. Moderate splenomegaly. Lobulated retroperitoneal soft tissue masses in the region of the right ureteropelvic junction likely resulting in mild right hydronephrosis secondary to extrinsic mass effect as well as within the right retroperitoneum 05/04/2021: needle core biopsy of right retroperitoneal lymph node shows large B cell lymphoma. 05/13/2021: establish care with Dr. Lorenso Courier 05/23/2021-06/01/2021: admitted with urosepsis. Concern for endocarditis, started on 6 week course abx via PICC line.   06/09/2021-06/11/2021: Cycle 1 of R-CEOP 07/01/2021-07/03/2021: Cycle 2 of R-CEOP 07/22/2021-07/24/2021: Cycle 3 of R-CEOP 08/12/2021-08/14/2021: Cycle 4 of R-CEOP 09/02/2021-09/04/2021: Cycle 5 of R-CEOP 09/07/2021: PET CT scan showed marked interval improvement with respect to nodal disease in the neck, chest, abdomen and pelvis, near complete resolution of visible, lymph nodes. Deauville criteria 2/3. 09/23/2021-09/25/2021: Cycle 6 of R-CEOP 11/04/2021: NM PET CT scan showed no suspicious lymphadenopathy in the neck, chest, abdomen, or pelvis. Deauville criteria 1. Consistent with a complete metabolic response.   Interval History:  [name] 76 y.o. male with medical history significant for stage III/IV diffuse large B-cell lymphoma presents for a follow up visit. The patient's last visit was on 05/07/2022. In the interim since the last visit he has had no major changes in his health.  On exam today, Mr. Ressel is  accompanied by a translator for this visit. He still experiences back pain but improved after undergoing steroid injection with Dr. Reatha Armour at [facility]. He reports his energy levels are stable and he uses a walker to help with ambulation. He denies ay appetite changes or noticeable weight changes. He denies nausea, vomiting or abdominal pain. His bowel habits are unchanged without recurrent episodes of diarrhea or constipation. He denies bruising or bleeding. He continues to have lower extremity edema. He is currently taking torsemide 20 mg daily and dose is managed by cardiology. He denies any fevers, chills, sweats, shortness of breath, chest pain or cough. He has no other complaints. 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 per dr Lucianne Lei dam note 06-18-2021   Hematuria 06/18/2021   Hypertension    Sleep apnea    uses cpap   Uses walker 07/24/2021   prn or cane   UTI (urinary tract infection)    finished antibiotics on 07-08-2021   Wound of skin 10/20/2021   sees wound care center for multiple dressing changes    SURGICAL HISTORY: Past Surgical History:  Procedure Laterality Date   CARDIAC CATHETERIZATION     CYSTOSCOPY W/ URETERAL STENT PLACEMENT Right 07/28/2021   Procedure: CYSTOSCOPY WITH RETROGRADE PYELOGRAM/URETERAL STENT EXCHANGE;  Surgeon: [name], MD;  Location: [facility];  Service: Urology;  Laterality: Right;  [place] PLACEMENT Right 10/27/2021   Procedure: CYSTOSCOPY WITH RETROGRADE PYELOGRAM/ URETERAL STENT REMOVAL;  Surgeon: [name], MD;  Location: [facility];  Service: Urology;  Laterality: Right;   CYSTOSCOPY WITH RETROGRADE PYELOGRAM, URETEROSCOPY AND STENT PLACEMENT Right 05/01/2021   Procedure: CYSTOSCOPY WITH RIGHT  RETROGRADE PYELOGRAM,  AND RIGHT STENT PLACEMENT;  Surgeon: Festus Aloe, MD;  Location: [facility];  Service: Urology;  Laterality: Right;   EYE SURGERY     INJECTION OF SILICONE OIL Left 40/98/1191   Procedure: INJECTION OF SILICONE OIL;  Surgeon: Sherlynn Stalls, MD;  Location: [place];  Service: Ophthalmology;  Laterality: Left;   INJECTION OF SILICONE OIL Left 47/82/9562   Procedure: INJECTION OF SILICONE OIL;  Surgeon: Sherlynn Stalls, MD;  Location: [place];  Service: Ophthalmology;  Laterality: Left;   JOINT REPLACEMENT Right    hip   LASER PHOTO ABLATION Left 12/25/2020   Procedure: LASER PHOTO ABLATION;  Surgeon: Sherlynn Stalls, MD;  Location: [place];  Service: Ophthalmology;  Laterality: Left;   PARS PLANA VITRECTOMY Left 11/10/2020   Procedure: PARS PLANA VITRECTOMY WITH 25 GAUGE;  Surgeon: Sherlynn Stalls, MD;  Location: [place];  Service: Ophthalmology;  Laterality: Left;   PARS PLANA VITRECTOMY Left 12/25/2020   Procedure: PARS PLANA VITRECTOMY WITH 25 GAUGE IN LEFT EYE;  Surgeon: Sherlynn Stalls, MD;  Location: [place];  Service: Ophthalmology;  Laterality: Left;   PERFLUORONE INJECTION Left 12/25/2020   Procedure: PERFLUORONE INJECTION;  Surgeon: Sherlynn Stalls, MD;  Location: [place];  Service: Ophthalmology;  Laterality: Left;   PHOTOCOAGULATION WITH LASER Left 11/10/2020   Procedure: PHOTOCOAGULATION WITH LASER;  Surgeon: Sherlynn Stalls, MD;  Location: [place];  Service: Ophthalmology;  Laterality: Left;   removal of picc line     09-2021   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   Procedure: RETINECTOMY LEFT EYE;  Surgeon: Sherlynn Stalls, MD;  Location: [place];  Service: Ophthalmology;  Laterality: Left;   SILICON OIL REMOVAL Left 12/25/2020   Procedure:  SILICONE OIL REMOVAL;  Surgeon: Sherlynn Stalls, MD;  Location: [place];  Service: Ophthalmology;  Laterality: Left;   TEE WITHOUT CARDIOVERSION N/A 05/29/2021   Procedure: TRANSESOPHAGEAL ECHOCARDIOGRAM (TEE);  Surgeon: Werner Lean, MD;  Location: [facility];  Service: Cardiovascular;  Laterality: N/A;    SOCIAL HISTORY: Social History   Socioeconomic History   Marital status: Married    Spouse name: Not on file   Number of children: Not on file   Years of education: Not on file   Highest education level: Not on file  Occupational History   Not on file  Tobacco Use   Smoking status: Never   Smokeless tobacco: Never  Vaping Use   Vaping Use: Never used  Substance and Sexual Activity   Alcohol use: Not Currently   Drug use: Never   Sexual activity: Yes  Other Topics Concern   Not on file  Social History Narrative   Not on file   Social Determinants of Health   Financial Resource Strain: Not on file  Food Insecurity: Not on file  Transportation Needs: Not on file  Physical Activity: Not on file  Stress: Not on file  Social Connections: Not on file  Intimate Partner Violence: Not on file    FAMILY HISTORY: Family History  Problem Relation Age of Onset   CAD Other     ALLERGIES:  has No Known Allergies.  MEDICATIONS:  Current Outpatient Medications  Medication Sig Dispense Refill   acetaminophen (TYLENOL) 650 MG CR tablet Take 650 mg by mouth every 8 (eight) hours as needed for pain or fever (headache).     allopurinol (ZYLOPRIM) 300 MG tablet Take 300 mg by mouth at bedtime.     amiodarone (PACERONE) 200 MG tablet Take 200 mg by mouth every morning.     amLODipine (NORVASC) 10 MG tablet Take 10 mg by mouth daily.     apixaban (ELIQUIS) 5 MG TABS tablet Take 5 mg by mouth 2 (two) times daily.     atorvastatin (LIPITOR) 80 MG tablet Take 80 mg by mouth daily.     clopidogrel (PLAVIX) 75 MG tablet Take 75 mg by mouth daily.     ferrous sulfate 325 (65  FE) MG EC tablet Take 1 tablet (325 mg total) by mouth daily with breakfast. 30 tablet 3   finasteride (PROSCAR) 5 MG tablet Take 5 mg by mouth at bedtime.     glipiZIDE (GLUCOTROL) 10 MG tablet Take 10 mg by mouth 2 (two) times daily.     insulin glargine (LANTUS) 100 unit/mL SOPN Inject 34 Units into the skin at bedtime.     isosorbide mononitrate (IMDUR) 30 MG 24 hr tablet Take 30 mg by mouth every morning.     metFORMIN (GLUCOPHAGE) 1000 MG tablet Take 1 tablet (1,000 mg total) by mouth daily with breakfast. 30 tablet 0   nitroGLYCERIN (NITROSTAT) 0.3 MG SL tablet Place 0.3 mg under the tongue every 5 (five) minutes x 3 doses as needed for chest pain.     oxyCODONE (OXY IR/ROXICODONE) 5 MG immediate release tablet Take 1 tablet (5 mg total) by mouth every 4 (four) hours as needed for severe pain. 30 tablet 0   prednisoLONE acetate (PRED FORTE) 1 % ophthalmic suspension Place 1 drop into the left eye daily.     pregabalin (LYRICA) 50 MG capsule Take 1 capsule (50 mg total) by mouth daily. 90 capsule 1   silodosin (RAPAFLO) 8 MG CAPS capsule Take 8 mg by mouth at bedtime.     terazosin (HYTRIN) 2 MG capsule Take 2 mg by mouth at bedtime.     torsemide (DEMADEX) 20 MG tablet Take 1 tablet (20 mg total) by mouth daily. 90 tablet 1   metoprolol tartrate (LOPRESSOR) 50 MG tablet Take 1 tablet (50 mg total) by mouth 2 (two) times daily. 60 tablet 1   No current facility-administered medications for this visit.   Facility-Administered Medications Ordered in Other Visits  Medication Dose Route Frequency Provider Last Rate Last Admin   prochlorperazine (COMPAZINE) 10 MG tablet            sodium chloride flush (NS) 0.9 % injection 10 mL  10 mL Intracatheter PRN Ledell Peoples IV, MD       sodium chloride flush (NS) 0.9 % injection 10 mL  10 mL Intravenous Once Orson Slick, MD        REVIEW OF SYSTEMS:   Constitutional: ( - ) fevers, ( - )  chills , ( - ) night sweats Eyes: ( - ) blurriness  of vision, ( - ) double vision, ( - ) watery eyes Ears, nose, mouth, throat, and face: ( - ) mucositis, ( - ) sore throat Respiratory: ( - ) cough, ( - ) dyspnea, ( - ) wheezes Cardiovascular: ( - ) palpitation, ( - ) chest discomfort, ( +) lower extremity swelling Gastrointestinal:  ( - )  nausea, ( - ) heartburn, ( - ) change in bowel habits Skin: ( - ) abnormal skin rashes Lymphatics: ( - ) new lymphadenopathy, ( - ) easy bruising Neurological: ( - ) numbness, ( - ) tingling, ( - ) new weaknesses Behavioral/Psych: ( - ) mood change, ( - ) new changes  All other systems were reviewed with the patient and are negative.  PHYSICAL EXAMINATION: ECOG PERFORMANCE STATUS: 1 - Symptomatic but completely ambulatory  Vitals:   08/13/22 1508  BP: (!) 150/76  Pulse: 73  Resp: 16  Temp: (!) 97.5 F (36.4 C)  SpO2: 94%     Filed Weights   08/13/22 1508  Weight: 259 lb 14.4 oz (117.9 kg)    GENERAL: Well-appearing elderly [place] male, alert, no distress and comfortable SKIN: skin color, texture, turgor are normal, no rashes or significant lesions EYES: conjunctiva are pink and non-injected, sclera clear NECK: supple, non-tender LYMPH:  no palpable lymphadenopathy in the cervical or  clavicular region. LUNGS: clear to auscultation and percussion with normal breathing effort HEART: regular rate & rhythm and no murmurs. 3+ pitting edema bilaterally in the lower extremities below the knee. PSYCH: alert & oriented x 3, fluent speech NEURO: no focal motor/sensory deficits  LABORATORY DATA:  I have reviewed the data as listed    Latest Ref Rng & Units 08/13/2022    2:49 PM 06/21/2022    4:40 PM 05/07/2022   11:15 AM  CBC  WBC 4.0 - 10.5 K/uL 7.5  10.2  6.4   Hemoglobin 13.0 - 17.0 g/dL 11.6  12.0  10.9   Hematocrit 39.0 - 52.0 % 34.0  37.4  33.1   Platelets 150 - 400 K/uL 188  203  184        Latest Ref Rng & Units 08/13/2022    2:49 PM 08/04/2022   10:40 AM 06/21/2022    4:40 PM   CMP  Glucose 70 - 99 mg/dL 282  301  324   BUN 8 - 23 mg/dL 19  22  39   Creatinine 0.61 - 1.24 mg/dL 1.19  1.55  1.86   Sodium 135 - 145 mmol/L 139  139  142   Potassium 3.5 - 5.1 mmol/L 4.3  4.4  5.1   Chloride 98 - 111 mmol/L 106  100  103   CO2 22 - 32 mmol/L '28  25  20   '$ Calcium 8.9 - 10.3 mg/dL 8.7  8.6  9.4   Total Protein 6.5 - 8.1 g/dL 5.7     Total Bilirubin 0.3 - 1.2 mg/dL 0.5     Alkaline Phos 38 - 126 U/L 110     AST 15 - 41 U/L 43     ALT 0 - 44 U/L 57        RADIOGRAPHIC STUDIES: I reviewed the images below. No evidence of fluid in elbow or evidence of fracture   DG Elbow 2 Views Left  Result Date: 08/13/2022 CLINICAL DATA:  Fall with elbow swelling EXAM: LEFT ELBOW - 2 VIEW COMPARISON:  None Available. FINDINGS: No fracture or malalignment. No definitive elbow effusion. Protuberant soft tissue swelling over the proximal posterior forearm proximal IMPRESSION: No acute osseous abnormality. Electronically Signed   By: Donavan Foil M.D.   On: 08/13/2022 16:27    ASSESSMENT & PLAN [name] 76 y.o. male with medical history significant for stage III/IV diffuse large B-cell lymphoma presents for a follow up visit.    After review  of the labs, review of the records, and discussion with the patient the patients findings are most consistent with advanced stage diffuse large B-cell lymphoma.  PET scan confirmed widespread disease in the neck, chest, abdomen and pelvis. Additionally, there is evidence of splenomegaly and a 5.4 cm soft tissue lesion along the right lower kidney. Patient does have a cardiac history significant for atrial fibrillation on amiodarone and therefore we may need to consider dropping anthracyclines from his regimen. The treatment consists of R-CEOP based regimen moving forward.  Posttreatment PET CT scan on 11/04/2021 showed complete metabolic response to therapy.  DLBCL IPI Score: 2 points, good prognosis. 80% progression free survival    #  Diffuse Large B Cell Lymphoma, ABC subtype. Stage III/IV -- Findings at this time are most consistent with stage III/IV diffuse large B-cell lymphoma. --Treatment consisted R-CEOP chemotherapy given his cardiac issues and mildly reduced EF(he is on amiodarone for atrial fibrillation) --Received 6 cycles of R-CEOP from 06/09/2021-09/26/2021 --Post treatment PET scan from 11/04/2021 showed no suspicious lymphadenopathy in the neck, chest, abdomen, or pelvis. Deauville criteria 1. Consistent with a complete metabolic response. Plan: --labs from today were reviewed. Hgb 11.6, MCV 79.6, white blood cell count 7.5, and platelets 188 --continue on surveillance as there is no clinical evidence of recurrence. --RTC in 3 months with labs and CT CAP.  #Microcytic anemia --Hgb 11.6, MCV 79.6 --Iron panel checked previously that shows deficiency with serum iron 16, saturation 4%, TIBC 374. --Continue ferrous sulfate 325 mg once a day  #Hematuria, resolved --Likely secondary to anticoagulation (on Eliquis and Plavix), soft tissue lesion along the medial right lower kidney s/p ureteral stent placement.  --patient holding Plavix, continuing eliquis.   #Lower extremity edema -- Patient has bilateral swelling of the legs with pealing of skin and erythema. -- Patient is no long taking IV lasix since recent hospitalization. Currently on torsemide 20 mg PO daily -- Patient denies any shortness of breath or chest discomfort.  -- Patient will follow up with cardiology for any dose changes of diuretics.   #Hyperglycemia: --poorly controlled.  --glucose is 282 today --encouraged patient to stay compliant with DM medications and follow up with PCP.   #Left elbow edema: --After recent fall. --Xray today shows no fractures or malalignment. There is soft tissue swelling but no effusion for drainage.  --Advised to follow up with PCP.    Orders Placed This Encounter  Procedures   DG Elbow 2 Views Left     Standing Status:   Future    Number of Occurrences:   1    Standing Expiration Date:   08/14/2023    Order Specific Question:   Reason for Exam (SYMPTOM  OR DIAGNOSIS REQUIRED)    Answer:   fall, swelling in left forearm    Order Specific Question:   Preferred imaging location?    Answer:   [facility]   All questions were answered. The patient knows to call the clinic with any problems, questions or concerns.  I have spent a total of 30 minutes minutes of face-to-face and non-face-to-face time, preparing to see the patient, performing a medically appropriate examination, counseling and educating the patient, ordering medications/tests,communicating with other health care professionals, documenting clinical information in the electronic health record, and care coordination.   Dede Query PA-C Dept of Hematology and [place] at [facility] Phone: [phone]    08/16/2022 6:09 AM

## 2022-08-16 NOTE — Telephone Encounter (Signed)
Pt's daughter, Derrick Ravel, advised of results with VU.  Agreed to f/up with PCP

## 2022-08-16 NOTE — Telephone Encounter (Signed)
-----   Message from Lincoln Brigham, PA-C sent at 08/16/2022  7:01 AM EDT ----- Please notify patient that xray showed swelling but no fluid for draining. He needs to follow up with PCP.

## 2022-08-16 NOTE — Telephone Encounter (Signed)
Dr. Harrington Challenger, you recently saw this patient on 06/21/22 and requested that he return in 3 weeks for follow-up. BNP elevated at 1311, continued current diuretic. He has chronic leg edema. Forwarding to Pharm to address Eliquis hold. Would you be agreeable for him to proceed with retinal procedure and to hold Plavix for 5 days prior to procedure? Or do you prefer that he follow-up in person prior to procedure.  Please direct response to p cv div preop  Thank you, Sharyn Lull

## 2022-08-16 NOTE — Telephone Encounter (Signed)
Patient with diagnosis of afib on Eliquis for anticoagulation.    Procedure: RETINAL SURGERY Date of procedure: 08/19/22   CHA2DS2-VASc Score = 5   This indicates a 7.2% annual risk of stroke. The patient's score is based upon: CHF History: 1 HTN History: 1 Diabetes History: 1 Stroke History: 0 Vascular Disease History: 1 Age Score: 1 Gender Score: 0      CrCl 69 ml/min  Per office protocol, patient can hold Eliquis for 2 days prior to procedure.    **This guidance is not considered finalized until pre-operative APP has relayed final recommendations.**

## 2022-08-17 NOTE — Telephone Encounter (Signed)
   Primary Cardiologist: Dorris Carnes, MD  Chart reviewed as part of pre-operative protocol coverage. Given past medical history and time since last visit, based on ACC/AHA guidelines, [name] would be at acceptable risk for the planned procedure without further cardiovascular testing.   He may hold his Eliquis for 2 days prior to procedure. He may hold Plavix for 5 days prior to procedure. Both agents should be resumed as soon as hemodynamically stable after the procedure.   I will route this recommendation to the requesting party via Epic fax function and remove from pre-op pool.  Please call with questions.  Emmaline Life, NP-C  08/17/2022, 11:41 AM [address] Office [phone] Fax [phone]

## 2022-08-17 NOTE — Telephone Encounter (Signed)
Agree with holding plavix 5 days   and Eliquis 2 days  OK to proceed with surgery

## 2022-09-02 ENCOUNTER — Encounter: Payer: Self-pay | Admitting: Hematology and Oncology

## 2022-09-07 ENCOUNTER — Other Ambulatory Visit: Payer: Self-pay | Admitting: Physician Assistant

## 2022-09-07 DIAGNOSIS — R053 Chronic cough: Secondary | ICD-10-CM

## 2022-09-09 ENCOUNTER — Emergency Department (HOSPITAL_BASED_OUTPATIENT_CLINIC_OR_DEPARTMENT_OTHER)
Admission: EM | Admit: 2022-09-09 | Discharge: 2022-09-09 | Disposition: A | Discharge status: Home / Self Care | Payer: Medicare HMO | Attending: Emergency Medicine | Admitting: Emergency Medicine

## 2022-09-09 ENCOUNTER — Other Ambulatory Visit: Payer: Self-pay

## 2022-09-09 ENCOUNTER — Encounter (HOSPITAL_BASED_OUTPATIENT_CLINIC_OR_DEPARTMENT_OTHER): Payer: Self-pay | Admitting: Emergency Medicine

## 2022-09-09 ENCOUNTER — Emergency Department (HOSPITAL_BASED_OUTPATIENT_CLINIC_OR_DEPARTMENT_OTHER): Payer: Medicare HMO | Admitting: Radiology

## 2022-09-09 DIAGNOSIS — Z20822 Contact with and (suspected) exposure to covid-19: Secondary | ICD-10-CM | POA: Insufficient documentation

## 2022-09-09 DIAGNOSIS — D649 Anemia, unspecified: Secondary | ICD-10-CM | POA: Insufficient documentation

## 2022-09-09 DIAGNOSIS — R42 Dizziness and giddiness: Secondary | ICD-10-CM | POA: Insufficient documentation

## 2022-09-09 DIAGNOSIS — Z7901 Long term (current) use of anticoagulants: Secondary | ICD-10-CM | POA: Diagnosis not present

## 2022-09-09 DIAGNOSIS — I2510 Atherosclerotic heart disease of native coronary artery without angina pectoris: Secondary | ICD-10-CM | POA: Diagnosis not present

## 2022-09-09 DIAGNOSIS — R531 Weakness: Secondary | ICD-10-CM | POA: Insufficient documentation

## 2022-09-09 DIAGNOSIS — R600 Localized edema: Secondary | ICD-10-CM | POA: Diagnosis not present

## 2022-09-09 DIAGNOSIS — R059 Cough, unspecified: Secondary | ICD-10-CM | POA: Diagnosis not present

## 2022-09-09 DIAGNOSIS — Z7984 Long term (current) use of oral hypoglycemic drugs: Secondary | ICD-10-CM | POA: Diagnosis not present

## 2022-09-09 DIAGNOSIS — Z7902 Long term (current) use of antithrombotics/antiplatelets: Secondary | ICD-10-CM | POA: Diagnosis not present

## 2022-09-09 DIAGNOSIS — I480 Paroxysmal atrial fibrillation: Secondary | ICD-10-CM | POA: Diagnosis not present

## 2022-09-09 DIAGNOSIS — Z794 Long term (current) use of insulin: Secondary | ICD-10-CM | POA: Insufficient documentation

## 2022-09-09 DIAGNOSIS — E119 Type 2 diabetes mellitus without complications: Secondary | ICD-10-CM | POA: Insufficient documentation

## 2022-09-09 LAB — CBC WITH DIFFERENTIAL/PLATELET
Abs Immature Granulocytes: 0.01 10*3/uL (ref 0.00–0.07)
Basophils Absolute: 0 10*3/uL (ref 0.0–0.1)
Basophils Relative: 0 %
Eosinophils Absolute: 0.1 10*3/uL (ref 0.0–0.5)
Eosinophils Relative: 1 %
HCT: 34.3 % — ABNORMAL LOW (ref 39.0–52.0)
Hemoglobin: 11.4 g/dL — ABNORMAL LOW (ref 13.0–17.0)
Immature Granulocytes: 0 %
Lymphocytes Relative: 24 %
Lymphs Abs: 1 10*3/uL (ref 0.7–4.0)
MCH: 26.6 pg (ref 26.0–34.0)
MCHC: 33.2 g/dL (ref 30.0–36.0)
MCV: 80 fL (ref 80.0–100.0)
Monocytes Absolute: 0.5 10*3/uL (ref 0.1–1.0)
Monocytes Relative: 12 %
Neutro Abs: 2.5 10*3/uL (ref 1.7–7.7)
Neutrophils Relative %: 63 %
Platelets: 157 10*3/uL (ref 150–400)
RBC: 4.29 MIL/uL (ref 4.22–5.81)
RDW: 14.4 % (ref 11.5–15.5)
WBC: 4.1 10*3/uL (ref 4.0–10.5)
nRBC: 0 % (ref 0.0–0.2)

## 2022-09-09 LAB — COMPREHENSIVE METABOLIC PANEL
ALT: 52 U/L — ABNORMAL HIGH (ref 0–44)
AST: 48 U/L — ABNORMAL HIGH (ref 15–41)
Albumin: 3.8 g/dL (ref 3.5–5.0)
Alkaline Phosphatase: 83 U/L (ref 38–126)
Anion gap: 15 (ref 5–15)
BUN: 18 mg/dL (ref 8–23)
CO2: 25 mmol/L (ref 22–32)
Calcium: 8.3 mg/dL — ABNORMAL LOW (ref 8.9–10.3)
Chloride: 99 mmol/L (ref 98–111)
Creatinine, Ser: 1.49 mg/dL — ABNORMAL HIGH (ref 0.61–1.24)
GFR, Estimated: 49 mL/min — ABNORMAL LOW (ref >60)
Glucose, Bld: 205 mg/dL — ABNORMAL HIGH (ref 70–99)
Potassium: 3.5 mmol/L (ref 3.5–5.1)
Sodium: 139 mmol/L (ref 135–145)
Total Bilirubin: 0.7 mg/dL (ref 0.3–1.2)
Total Protein: 5.8 g/dL — ABNORMAL LOW (ref 6.5–8.1)

## 2022-09-09 LAB — URINALYSIS, ROUTINE W REFLEX MICROSCOPIC
Bilirubin Urine: NEGATIVE
Glucose, UA: NEGATIVE mg/dL
Hgb urine dipstick: NEGATIVE
Ketones, ur: NEGATIVE mg/dL
Leukocytes,Ua: NEGATIVE
Nitrite: NEGATIVE
Specific Gravity, Urine: 1.014 (ref 1.005–1.030)
pH: 5 (ref 5.0–8.0)

## 2022-09-09 LAB — LACTIC ACID, PLASMA
Lactic Acid, Venous: 2.3 mmol/L (ref 0.5–1.9)
Lactic Acid, Venous: 1.4 mmol/L (ref 0.5–1.9)

## 2022-09-09 LAB — BRAIN NATRIURETIC PEPTIDE: B Natriuretic Peptide: 208.9 pg/mL — ABNORMAL HIGH (ref 0.0–100.0)

## 2022-09-09 LAB — TROPONIN I (HIGH SENSITIVITY)
Troponin I (High Sensitivity): 26 ng/L — ABNORMAL HIGH (ref <18)
Troponin I (High Sensitivity): 23 ng/L — ABNORMAL HIGH (ref <18)

## 2022-09-09 LAB — SARS CORONAVIRUS 2 BY RT PCR: SARS Coronavirus 2 by RT PCR: NEGATIVE

## 2022-09-09 MED ORDER — BENZONATATE 100 MG PO CAPS: 100.0000 mg | ORAL_CAPSULE | Freq: Three times a day (TID) | ORAL | 0 refills | Status: DC

## 2022-09-09 MED ORDER — FAMOTIDINE 40 MG PO TABS: 20.0000 mg | ORAL_TABLET | Freq: Every day | ORAL | 0 refills | Status: DC

## 2022-09-09 NOTE — ED Notes (Signed)
Pt ambulated down the hall with a walker with no difficulties and remianed at 96% with a HR of 79. Pt  calm and not in distress.

## 2022-09-09 NOTE — ED Provider Notes (Signed)
[facility] EMERGENCY DEPT Provider Note   CSN: [id number] Arrival date & time: 09/09/22  1123     History  Chief Complaint  Patient presents with   Dizziness    [name] is a 76 y.o. male.   Dizziness   76 year old male presents emergency department with multiple complaints.  Patient reports generalized weakness that has increased over the past 2 to 3 weeks.  He reports baseline lower extremity weakness ever since chemotherapy for B-cell lymphoma the patient has not received chemotherapy in the past year.  Patient reports 3-4 falls over the past few weeks secondary to feelings of lower extremity weakness.  Most recent fall occurring approximately 9 to 10 days ago.  He secondarily endorses some dizziness that occurs when he stands up from a seated position or from a lying down position.  Dizziness resolves once he stands for a matter of seconds and is not experiencing again.  Denies syncopal event associated with dizziness or dizziness associated with positional changes of the head solely.  He also reports feelings of difficulty urinating.  He states he has been able to void with most recent void occurring this morning.  Denies feelings of dysuria/hematuria.  He also is increasing cough.  He states he had a cough for the past 6 months but is gotten acutely worse over the past 2 months.  Denies fever, chills, night sweats, chest pain, shortness of breath, abdominal pain, nausea, vomiting, change in bowel habits.  Denies visual disturbance, gait abnormalities, sensory deficits, slurred speech, facial droop.  Past medical history significant for thoracic aortic aneurysm, diabetes mellitus type 2, endocarditis, OSA, paroxysmal atrial fibrillation on Eliquis, diffuse B-cell lymphoma, complicated UTI, CAD  Home Medications Prior to Admission medications   Medication Sig Start Date End Date Taking? Authorizing Provider  acetaminophen (TYLENOL) 650 MG CR tablet Take 650 mg  by mouth every 8 (eight) hours as needed for pain or fever (headache).   Yes [provider]  allopurinol (ZYLOPRIM) 300 MG tablet Take 300 mg by mouth at bedtime.   Yes [provider]  amiodarone (PACERONE) 200 MG tablet Take 200 mg by mouth every morning. 04/02/21  Yes [provider]  amLODipine (NORVASC) 10 MG tablet Take 10 mg by mouth daily. 06/14/22  Yes [provider]  apixaban (ELIQUIS) 5 MG TABS tablet Take 5 mg by mouth 2 (two) times daily.   Yes [provider]  atorvastatin (LIPITOR) 80 MG tablet Take 80 mg by mouth daily.   Yes [provider]  benzonatate (TESSALON) 100 MG capsule Take 1 capsule (100 mg total) by mouth every 8 (eight) hours. 09/09/22  Yes Dion Saucier A, PA  clopidogrel (PLAVIX) 75 MG tablet Take 75 mg by mouth daily.   Yes [provider]  famotidine (PEPCID) 40 MG tablet Take 0.5 tablets (20 mg total) by mouth daily. 09/09/22  Yes Dion Saucier A, PA  ferrous sulfate 325 (65 FE) MG EC tablet Take 1 tablet (325 mg total) by mouth daily with breakfast. 02/01/22  Yes Dede Query T, PA-C  finasteride (PROSCAR) 5 MG tablet Take 5 mg by mouth at bedtime. 05/14/21  Yes [provider]  glipiZIDE (GLUCOTROL) 10 MG tablet Take 10 mg by mouth 2 (two) times daily. 05/15/21  Yes [provider]  insulin glargine (LANTUS) 100 unit/mL SOPN Inject 34 Units into the skin at bedtime.   Yes [provider]  isosorbide mononitrate (IMDUR) 30 MG 24 hr tablet Take 30 mg  by mouth every morning. 09/08/20  Yes [provider]  metFORMIN (GLUCOPHAGE) 1000 MG tablet Take 1 tablet (1,000 mg total) by mouth daily with breakfast. 01/25/22  Yes Little Ishikawa, MD  metoprolol tartrate (LOPRESSOR) 50 MG tablet Take 1 tablet (50 mg total) by mouth 2 (two) times daily. 05/05/21 09/09/22 Yes Shelly Coss, MD  oxyCODONE (OXY IR/ROXICODONE) 5 MG immediate release tablet Take 1 tablet (5 mg total)  by mouth every 4 (four) hours as needed for severe pain. 04/02/22  Yes Orson Slick, MD  prednisoLONE acetate (PRED FORTE) 1 % ophthalmic suspension Place 1 drop into the left eye daily.   Yes [provider]  pregabalin (LYRICA) 50 MG capsule Take 1 capsule (50 mg total) by mouth daily. 02/18/22  Yes Fay Records, MD  silodosin (RAPAFLO) 8 MG CAPS capsule Take 8 mg by mouth at bedtime.   Yes [provider]  terazosin (HYTRIN) 2 MG capsule Take 2 mg by mouth at bedtime.   Yes [provider]  torsemide (DEMADEX) 20 MG tablet Take 1 tablet (20 mg total) by mouth daily. 03/15/22 03/16/23 Yes Swinyer, Lanice Schwab, NP  nitroGLYCERIN (NITROSTAT) 0.3 MG SL tablet Place 0.3 mg under the tongue every 5 (five) minutes x 3 doses as needed for chest pain. 09/08/20   [provider]      Allergies    Patient has no known allergies.    Review of Systems   Review of Systems  Neurological:  Positive for dizziness.  All other systems reviewed and are negative.   Physical Exam Updated Vital Signs BP (!) 177/96   Pulse 69   Temp 98.6 F (37 C) (Oral)   Resp 18   Ht '5\' 9"'$  (1.753 m)   Wt 117.9 kg   SpO2 97%   BMI 38.40 kg/m  Physical Exam Vitals and nursing note reviewed.  Constitutional:      General: He is not in acute distress.    Appearance: Normal appearance. He is well-developed. He is obese. He is not ill-appearing.  HENT:     Head: Normocephalic and atraumatic.  Eyes:     Conjunctiva/sclera: Conjunctivae normal.  Cardiovascular:     Rate and Rhythm: Normal rate and regular rhythm.     Heart sounds: No murmur heard. Pulmonary:     Effort: Pulmonary effort is normal. No respiratory distress.     Breath sounds: Wheezing and rhonchi present.     Comments: Mild diffuse wheeze/rhonchi. Abdominal:     Palpations: Abdomen is soft.     Tenderness: There is no abdominal tenderness. There is no guarding.  Musculoskeletal:        General: No swelling.      Cervical back: Neck supple.     Right lower leg: Edema present.     Left lower leg: Edema present.     Comments: 2+ pitting edema noted bilateral lower extremities.  Patient states this is per baseline.  Skin:    General: Skin is warm and dry.     Capillary Refill: Capillary refill takes less than 2 seconds.  Neurological:     Mental Status: He is alert.     Comments: Alert and oriented to self, place, time and event.   Speech is fluent, clear without dysarthria or dysphasia.   Strength symmetric in upper/lower extremities   Sensation intact in upper/lower extremities   Gait not assessed  No pronator drift.  Normal finger-to-nose. CN I not tested  CN  II grossly intact visual fields bilaterally. Did not visualize posterior eye.  CN III, IV, VI PERRLA and EOMs intact bilaterally  CN V Intact sensation to sharp and light touch to the face  CN VII facial movements symmetric  CN VIII not tested  CN IX, X no uvula deviation, symmetric rise of soft palate  CN XI 5/5 SCM and trapezius strength bilaterally  CN XII Midline tongue protrusion, symmetric L/R movements     Psychiatric:        Mood and Affect: Mood normal.     ED Results / Procedures / Treatments   Labs (all labs ordered are listed, but only abnormal results are displayed) Labs Reviewed  COMPREHENSIVE METABOLIC PANEL - Abnormal; Notable for the following components:      Result Value   Glucose, Bld 205 (*)    Creatinine, Ser 1.49 (*)    Calcium 8.3 (*)    Total Protein 5.8 (*)    AST 48 (*)    ALT 52 (*)    GFR, Estimated 49 (*)    All other components within normal limits  CBC WITH DIFFERENTIAL/PLATELET - Abnormal; Notable for the following components:   Hemoglobin 11.4 (*)    HCT 34.3 (*)    All other components within normal limits  BRAIN NATRIURETIC PEPTIDE - Abnormal; Notable for the following components:   B Natriuretic Peptide 208.9 (*)    All other components within normal limits  URINALYSIS, ROUTINE  W REFLEX MICROSCOPIC - Abnormal; Notable for the following components:   Protein, ur TRACE (*)    All other components within normal limits  LACTIC ACID, PLASMA - Abnormal; Notable for the following components:   Lactic Acid, Venous 2.3 (*)    All other components within normal limits  TROPONIN I (HIGH SENSITIVITY) - Abnormal; Notable for the following components:   Troponin I (High Sensitivity) 26 (*)    All other components within normal limits  TROPONIN I (HIGH SENSITIVITY) - Abnormal; Notable for the following components:   Troponin I (High Sensitivity) 23 (*)    All other components within normal limits  SARS CORONAVIRUS 2 BY RT PCR  LACTIC ACID, PLASMA    EKG EKG Interpretation  Date/Time:  Thursday September 09 2022 11:51:52 EDT Ventricular Rate:  73 PR Interval:  188 QRS Duration: 100 QT Interval:  486 QTC Calculation: 536 R Axis:   -48 Text Interpretation: Sinus rhythm Left anterior fascicular block Nonspecific T wave abnormality Prolonged QT interval Confirmed by Lajean Saver [phone]) on 09/09/2022 11:58:09 AM  Radiology DG Chest 2 View  Result Date: 09/09/2022 CLINICAL DATA:  Productive cough EXAM: CHEST - 2 VIEW COMPARISON:  Chest 01/29/2022 FINDINGS: The heart size and mediastinal contours are within normal limits. Both lungs are clear. The visualized skeletal structures are unremarkable. IMPRESSION: No active cardiopulmonary disease. Electronically Signed   By: Franchot Gallo M.D.   On: 09/09/2022 13:15    Procedures Procedures    Medications Ordered in ED Medications - No data to display  ED Course/ Medical Decision Making/ A&P Clinical Course as of 09/09/22 2215  Thu Sep 09, 2022  1253 EKG 12-Lead [CR]    Clinical Course User Index [CR] Wilnette Kales, PA                           Medical Decision Making Amount and/or Complexity of Data Reviewed Labs: ordered. Radiology: ordered. ECG/medicine tests:  Decision-making details documented in ED  Course.  Risk Prescription drug management.   This patient presents to the ED for concern of multiple complaints, this involves an extensive number of treatment options, and is a complaint that carries with it a high risk of complications and morbidity.  The differential diagnosis includes orthostatic static, arrhythmia, Covid/influenza, anemia, cauda equina, CVA, nephrolithiasis, uretal mass, vertigo, malignancy   Co morbidities that complicate the patient evaluation  See HPI   Additional history obtained:  Additional history obtained from EMR External records from outside source obtained and reviewed including hospital records   Lab Tests:  I Ordered, and personally interpreted labs.  The pertinent results include: No leukocytosis.  Mild evidence anemia with a hemoglobin 11.4 which near patient's baseline.  Platelets within range.  No electrolyte abnormalities noted.  Renal function at patient's baseline with GFR 49, BUN of 18 and creatinine of 1.49.  Mild transaminitis with a AST of 48 and ALT of 52 which is nonspecific and patient is at elevation 3 weeks ago.  COVID-negative.  Troponin 26 with repeat 23; no EKG findings indicative of ischemia and patient has chronically elevated troponin given kidney function.  BNP of 208.9 oh which is also chronically elevated given prior studies.  UA without abnormalities.  Initial lactic of 2.3 with repeat 1.4.   Imaging Studies ordered:  I ordered imaging studies including chest x-ray I independently visualized and interpreted imaging which showed no active cardiopulmonary disease. I agree with the radiologist interpretation   Cardiac Monitoring: / EKG:  The patient was maintained on a cardiac monitor.  I personally viewed and interpreted the cardiac monitored which showed an underlying rhythm of: Sinus rhythm with left anterior fascicular block, prolonged QT as well as nonspecific T wave abnormalities.   Consultations  Obtained:  Attending physician Dr. Ashok Cordia who evaluated the patient independently and was agreement with treatment plan.  Problem List / ED Course / Critical interventions / Medication management  Reevaluation of the patient showed that the patient improved I have reviewed the patients home medicines and have made adjustments as needed   Social Determinants of Health:  Denies tobacco, illicit drug use.   Test / Admission - Considered:  Generalized weakness Vitals signs significant for hypertension with blood pressure 177/96.  Recommend close follow-up with PCP regarding elevation blood pressure.. Otherwise within normal range and stable throughout visit. Laboratory/imaging studies significant for: See above All of patient's symptoms today seem chronic in nature.  Patient has had chronic weakness since administration of chemotherapy.  He was able to walk in the emergency department with walker which is what he is able to at baseline with no abnormality or fall or unsteadiness.  Recommend PT evaluation given sedentary lifestyle.  Patient experiencing difficulty voiding but not retaining urine and no evidence of urinary tract infection.  Recommend close follow-up with urology outpatient.  Patient dizziness likely secondary to orthostatic hypotension given isolated evidences of positional changes.  Patient recommended to get up slowly from seated position as well as maintain adequate oral hydration with keeping in mind his history of heart failure.  Close follow-up with PCP recommended in 3 to 5 days.  Treatment plan discussed at length with patient he acknowledged understanding was agreeable to said plan.  Attending physician Dr. Ashok Cordia evaluated the patient independently and was agreement with plan.  Worrisome signs and symptoms were discussed with the patient the patient knowledge understanding was agreeable to said plan.  Patient was stable upon discharge from the hospital.  Final  Clinical Impression(s) / ED Diagnoses Final diagnoses:  Generalized weakness    Rx / DC Orders ED Discharge Orders          Ordered    benzonatate (TESSALON) 100 MG capsule  Every 8 hours        09/09/22 1913    famotidine (PEPCID) 40 MG tablet  Daily        09/09/22 1913    Ambulatory referral to Physical Therapy        09/09/22 1913              Wilnette Kales, [place] 09/09/22 2215    Lajean Saver, MD 09/10/22 1404

## 2022-09-09 NOTE — Discharge Instructions (Addendum)
Note the work-up today was overall reassuring.  As discussed, issues seem more chronic in nature than acute.  Recommend close follow-up with urology as discussed.   Please call the number attached your discharge papers.  Also recommend PT evaluation for progressive lower extremity weakness.  Also recommend close follow-up with the PCP for reevaluation in 3 to 5 days.  Please not hesitate to return to emergency department for worrisome signs and symptoms we discussed become apparent.

## 2022-09-09 NOTE — ED Notes (Signed)
Discharge instructions discussed with pt. Pt verbalized understanding. Pt stable and ambulatory.  °

## 2022-09-09 NOTE — ED Triage Notes (Signed)
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 Pt states that a small amount of urine is coming out during urination. Pt also reports  dizziness and generalized weakness, a cough and  accompanied with same but pt deins N/V or any blurry vision.  Pt has had about 3 falls last week due to generalized weakness.   Pt is on eloquis related to heart surgery.

## 2022-09-16 IMAGING — CT CT L SPINE W/ CM
3 of 4 series · 10 of 33 positions shown, 12 images · IV contrast (agent unspecified)
Comparison: PET scan 11/04/2021

CLINICAL DATA: Low back pain, cancer suspected, history of
lymphoma, worsening back pain.

EXAM:
CT LUMBAR SPINE WITH CONTRAST
TECHNIQUE: Multidetector CT imaging of the lumbar spine was performed with
intravenous contrast administration.

[Series 3: l spine bone · axial · 0.50mm/px · z∈[-236,+86]mm · 2 of 323 slices shown, 3 images]
[im 81/323  soft-tissue]
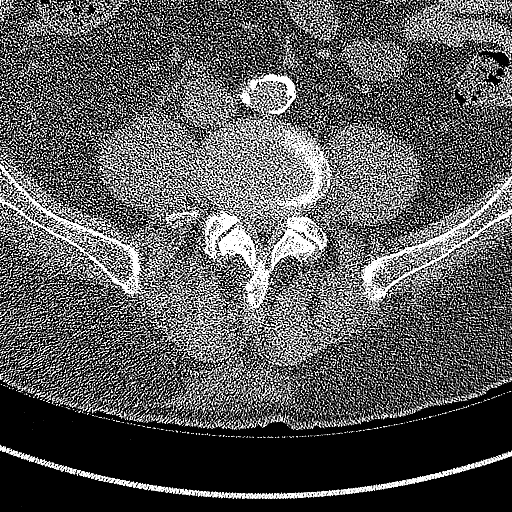
[im 81/323  bone]
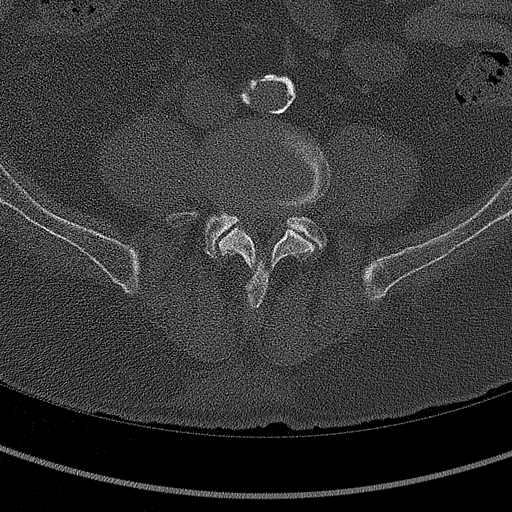
[im 242/323  bone]
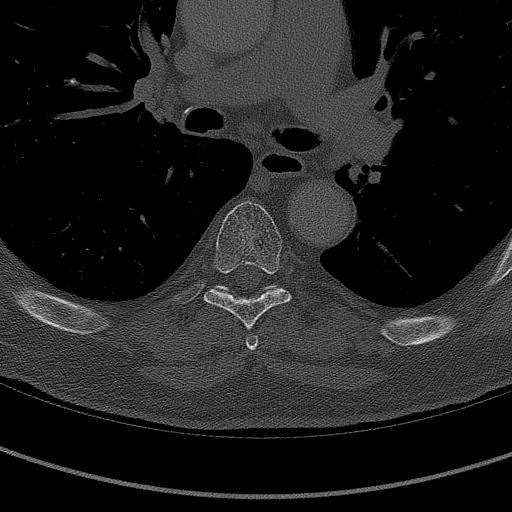

[Series 6: coronal bone · coronal · 0.45mm/px · 3 of 95 slices shown]
[im 19/95  bone]
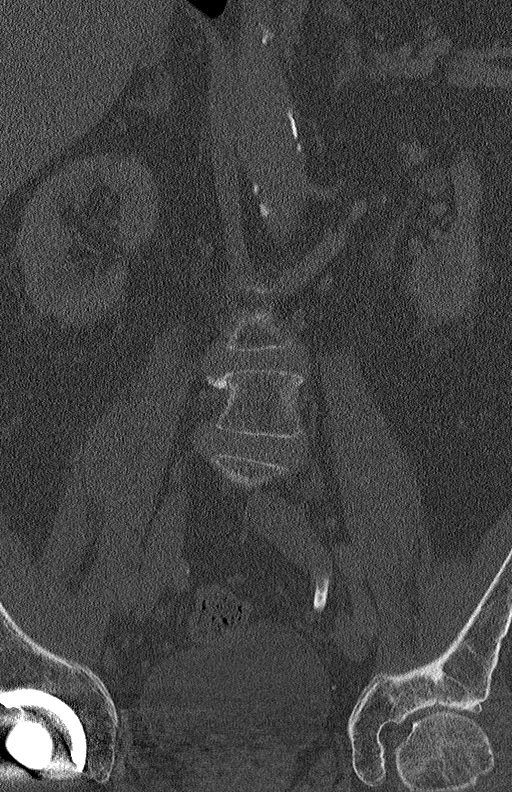
[im 38/95  bone]
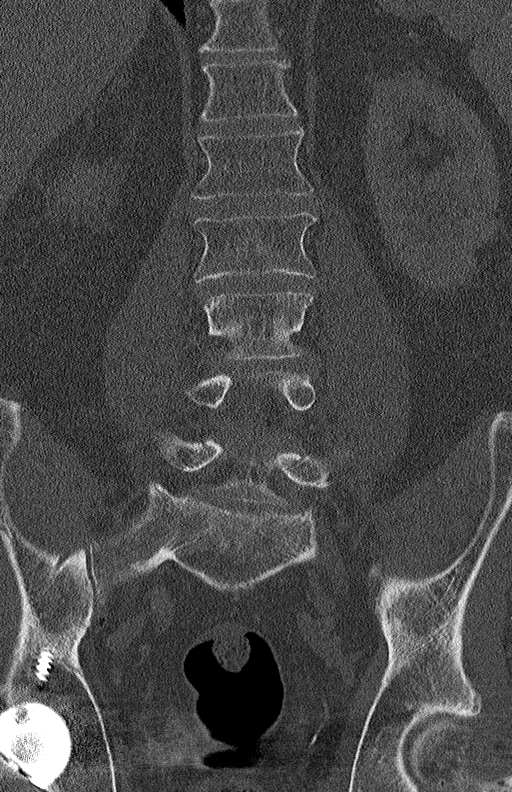
[im 57/95  bone]
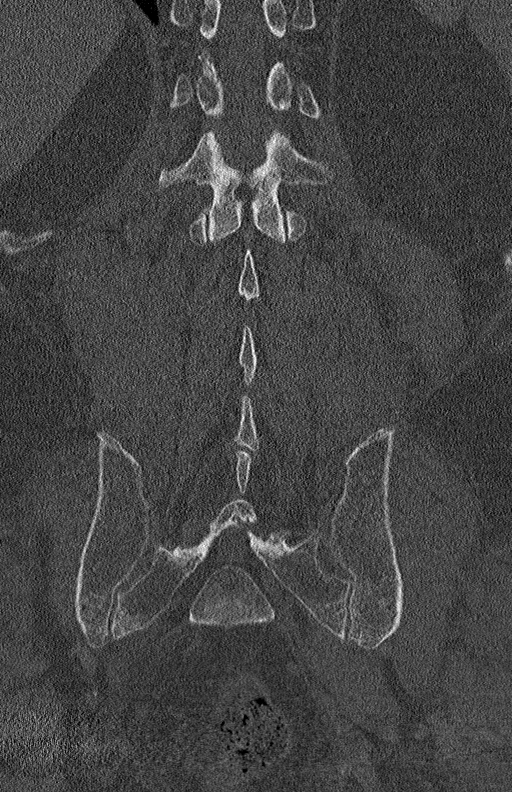

[Series 7: sagittal bone · sagittal · 0.37mm/px · 5 of 116 slices shown, 6 images]
[im 39/116  bone]
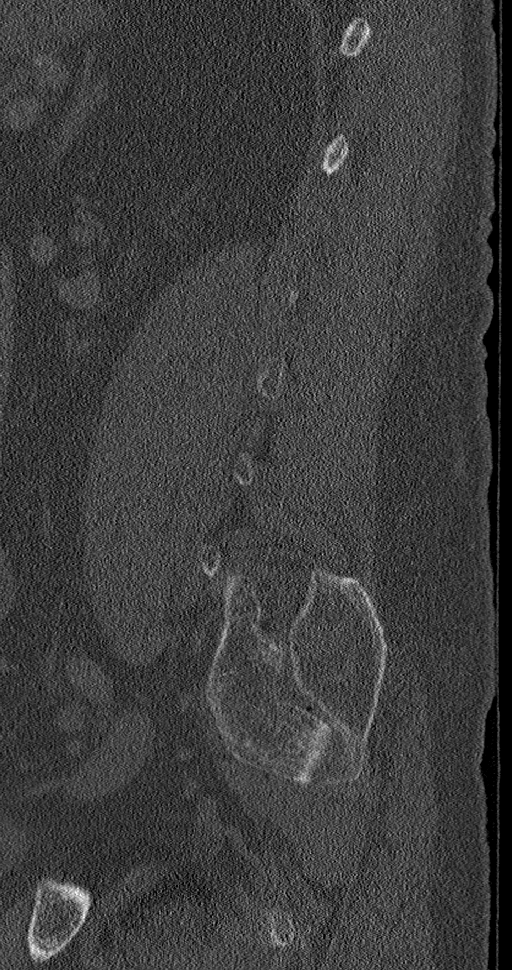
[im 48/116  bone]
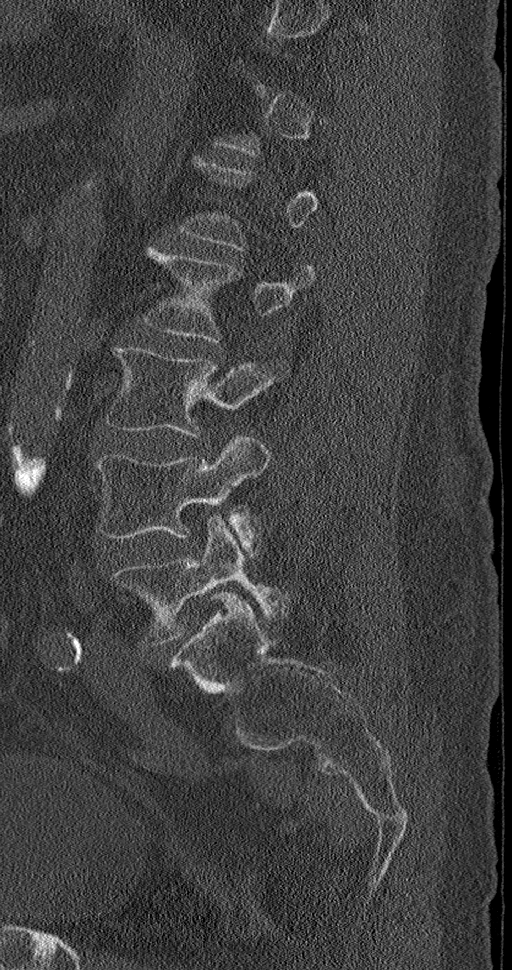
[im 58/116  soft-tissue]
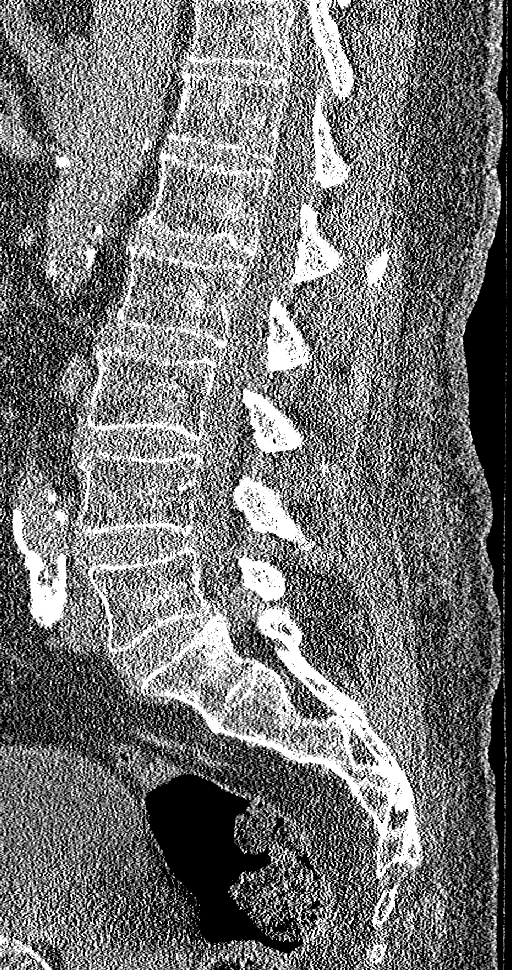
[im 58/116  bone]
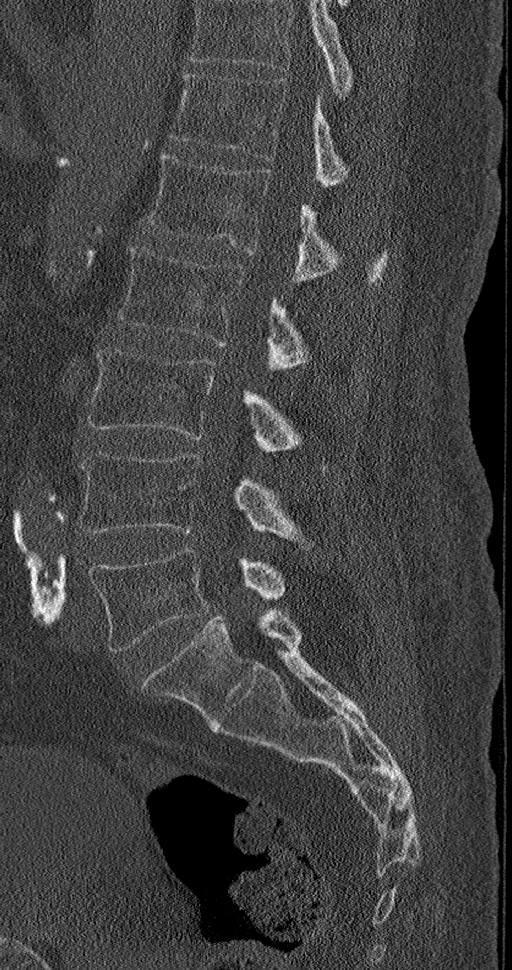
[im 68/116  bone]
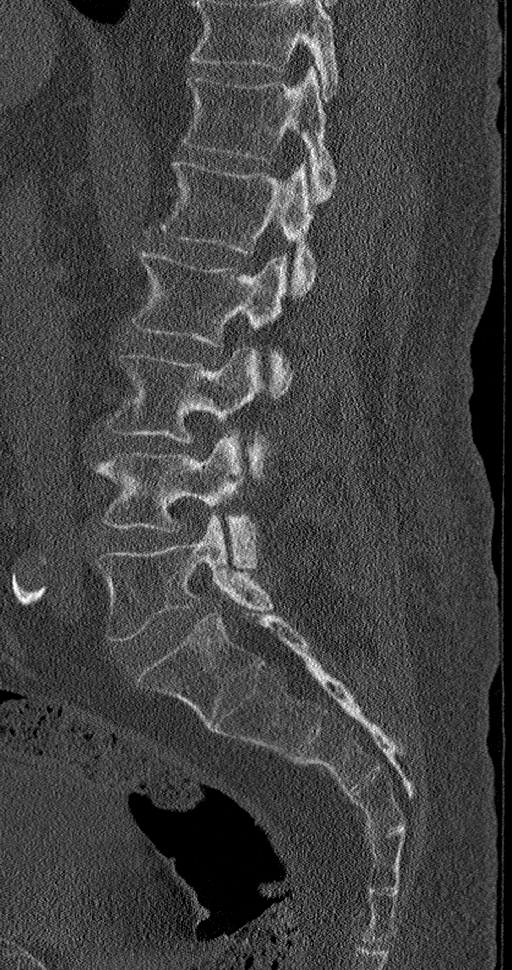
[im 77/116  bone]
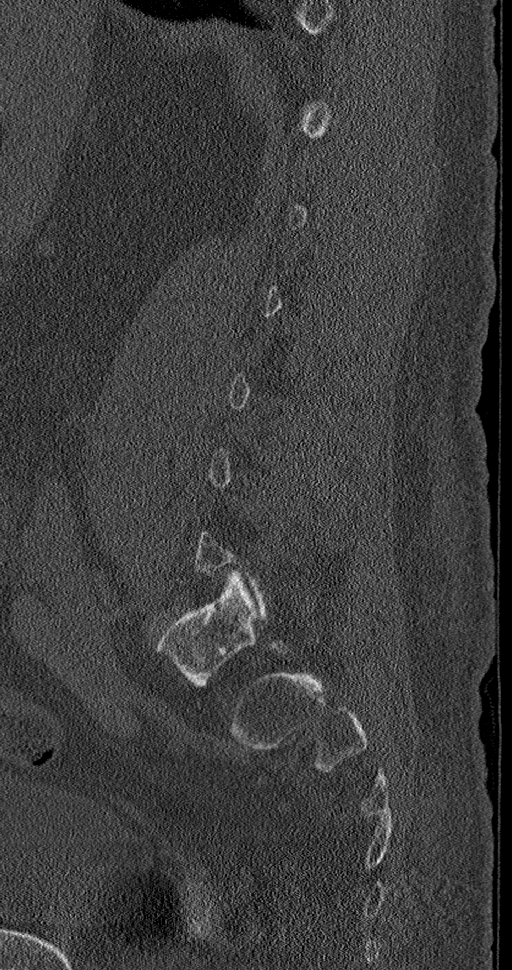

[10 of 33 positions shown; findings below may reference images not displayed]

RADIATION DOSE REDUCTION: This exam was performed according to the
departmental dose-optimization program which includes automated
exposure control, adjustment of the mA and/or kV according to
patient size and/or use of iterative reconstruction technique.

CONTRAST:  100mL OMNIPAQUE IOHEXOL 300 MG/ML  SOLN
FINDINGS: Segmentation: 5 lumbar type vertebral bodies.

Alignment: Mild scoliotic curvature convex to the left. 2 mm
degenerative anterolisthesis L5-S1.

Vertebrae: No fracture or lytic/sclerotic bone lesion.

Paraspinal and other soft tissues: Ordinary aortic atherosclerosis.

Disc levels: No significant disc level abnormality at T11-12, T12-L1
or L1-2.

L2-3: Mild bulging of the disc. Mild narrowing of the lateral
recesses without visible neural compression.

L3-4: Moderate bulging of the disc. Mild facet and ligamentous
hypertrophy. Mild multifactorial stenosis, particularly affecting
the lateral recesses.

L4-5: Moderate bulging of the disc. Bilateral facet and ligamentous
hypertrophy. Stenosis of both lateral recesses.

L5-S1: Advanced facet arthropathy right worse than left with 2 mm of
anterolisthesis. Bulging of the disc. Stenosis of the lateral
recesses and proximal foramina which could be symptomatic.

Previous right hip replacement appears unremarkable as seen.
IMPRESSION: The dominant finding in this case is that of chronic facet
arthropathy at L5-S1 with 2 mm of degenerative anterolisthesis.
There is stenosis of the lateral recesses and proximal foramina.
Findings could be symptomatic.

Degenerative changes also present at L2-3, L3-4 and L4-5 as above
which could contribute to symptoms.

No sign of lytic or sclerotic bone lesion or regional fracture.

## 2022-10-06 NOTE — Progress Notes (Deleted)
Synopsis: Referred for chronic cough by Lincoln Brigham, PA-C  Subjective:   PATIENT ID: [name] GENDER: male DOB: [birth year], MRN: [id number]  No chief complaint on file.  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 per dr Lucianne Lei dam note 06-18-2021   Hematuria 06/18/2021   Hypertension    Sleep apnea    uses cpap   Uses walker 07/24/2021   prn or cane   UTI (urinary tract infection)    finished antibiotics on 07-08-2021   Wound of skin 10/20/2021   sees wound care center for multiple dressing changes     Family History  Problem Relation Age of Onset   CAD Other      Past Surgical History:  Procedure Laterality Date   CARDIAC CATHETERIZATION     CYSTOSCOPY W/ URETERAL STENT PLACEMENT Right 07/28/2021   Procedure: CYSTOSCOPY WITH RETROGRADE PYELOGRAM/URETERAL STENT EXCHANGE;  Surgeon: [name], MD;  Location: [facility];  Service: Urology;  Laterality: Right;  30 MINS   CYSTOSCOPY W/ URETERAL STENT PLACEMENT Right 10/27/2021   Procedure: CYSTOSCOPY WITH RETROGRADE PYELOGRAM/ URETERAL STENT REMOVAL;  Surgeon: [name], MD;  Location: [facility];  Service: Urology;  Laterality: Right;   CYSTOSCOPY WITH RETROGRADE PYELOGRAM, URETEROSCOPY AND STENT PLACEMENT Right 05/01/2021   Procedure: CYSTOSCOPY WITH RIGHT  RETROGRADE PYELOGRAM,  AND RIGHT STENT PLACEMENT;  Surgeon: Festus Aloe, MD;  [facility];  Service: Urology;   Laterality: Right;   EYE SURGERY     INJECTION OF SILICONE OIL Left 08/65/7846   Procedure: INJECTION OF SILICONE OIL;  Surgeon: Sherlynn Stalls, MD;  Location: [place];  Service: Ophthalmology;  Laterality: Left;   INJECTION OF SILICONE OIL Left 96/29/5284   Procedure: INJECTION OF SILICONE OIL;  Surgeon: Sherlynn Stalls, MD;  Location: [place];  Service: Ophthalmology;  Laterality: Left;   JOINT REPLACEMENT Right    hip   LASER PHOTO ABLATION Left 12/25/2020   Procedure: LASER PHOTO ABLATION;  Surgeon: Sherlynn Stalls, MD;  Location: [place];  Service: Ophthalmology;  Laterality: Left;   PARS PLANA VITRECTOMY Left 11/10/2020   Procedure: PARS PLANA VITRECTOMY WITH 25 GAUGE;  Surgeon: Sherlynn Stalls, MD;  Location: [place];  Service: Ophthalmology;  Laterality: Left;   PARS PLANA VITRECTOMY Left 12/25/2020   Procedure: PARS PLANA VITRECTOMY WITH 25 GAUGE IN LEFT EYE;  Surgeon: Sherlynn Stalls, MD;  Location: [place];  Service: Ophthalmology;  Laterality: Left;   PERFLUORONE INJECTION Left 12/25/2020   Procedure: PERFLUORONE INJECTION;  Surgeon: Sherlynn Stalls, MD;  Location: [place];  Service: Ophthalmology;  Laterality: Left;   PHOTOCOAGULATION WITH LASER Left 11/10/2020   Procedure: PHOTOCOAGULATION WITH LASER;  Surgeon: Sherlynn Stalls, MD;  Location: [place];  Service: Ophthalmology;  Laterality: Left;   removal of picc line     09-2021   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   Procedure: RETINECTOMY  LEFT EYE;  Surgeon: Sherlynn Stalls, MD;  Location: [place];  Service: Ophthalmology;  Laterality: Left;   SILICON OIL REMOVAL Left 87/56/4332   Procedure: SILICONE OIL REMOVAL;  Surgeon: Sherlynn Stalls, MD;  Location: [place];  Service: Ophthalmology;  Laterality: Left;   TEE WITHOUT CARDIOVERSION N/A 05/29/2021   Procedure: TRANSESOPHAGEAL ECHOCARDIOGRAM (TEE);  Surgeon: Werner Lean, MD;  Location: [facility];  Service: Cardiovascular;  Laterality: N/A;    Social History    Socioeconomic History   Marital status: Married    Spouse name: Not on file   Number of children: Not on file   Years of education: Not on file   Highest education level: Not on file  Occupational History   Not on file  Tobacco Use   Smoking status: Never   Smokeless tobacco: Never  Vaping Use   Vaping Use: Never used  Substance and Sexual Activity   Alcohol use: Not Currently   Drug use: Never   Sexual activity: Yes  Other Topics Concern   Not on file  Social History Narrative   Not on file   Social Determinants of Health   Financial Resource Strain: Not on file  Food Insecurity: Not on file  Transportation Needs: Not on file  Physical Activity: Not on file  Stress: Not on file  Social Connections: Not on file  Intimate Partner Violence: Not on file     No Known Allergies   Outpatient Medications Prior to Visit  Medication Sig Dispense Refill   acetaminophen (TYLENOL) 650 MG CR tablet Take 650 mg by mouth every 8 (eight) hours as needed for pain or fever (headache).     allopurinol (ZYLOPRIM) 300 MG tablet Take 300 mg by mouth at bedtime.     amiodarone (PACERONE) 200 MG tablet Take 200 mg by mouth every morning.     amLODipine (NORVASC) 10 MG tablet Take 10 mg by mouth daily.     apixaban (ELIQUIS) 5 MG TABS tablet Take 5 mg by mouth 2 (two) times daily.     atorvastatin (LIPITOR) 80 MG tablet Take 80 mg by mouth daily.     benzonatate (TESSALON) 100 MG capsule Take 1 capsule (100 mg total) by mouth every 8 (eight) hours. 21 capsule 0   clopidogrel (PLAVIX) 75 MG tablet Take 75 mg by mouth daily.     famotidine (PEPCID) 40 MG tablet Take 0.5 tablets (20 mg total) by mouth daily. 30 tablet 0   ferrous sulfate 325 (65 FE) MG EC tablet Take 1 tablet (325 mg total) by mouth daily with breakfast. 30 tablet 3   finasteride (PROSCAR) 5 MG tablet Take 5 mg by mouth at bedtime.     glipiZIDE (GLUCOTROL) 10 MG tablet Take 10 mg by mouth 2 (two) times daily.     insulin  glargine (LANTUS) 100 unit/mL SOPN Inject 34 Units into the skin at bedtime.     isosorbide mononitrate (IMDUR) 30 MG 24 hr tablet Take 30 mg by mouth every morning.     metFORMIN (GLUCOPHAGE) 1000 MG tablet Take 1 tablet (1,000 mg total) by mouth daily with breakfast. 30 tablet 0   metoprolol tartrate (LOPRESSOR) 50 MG tablet Take 1 tablet (50 mg total) by mouth 2 (two) times daily. 60 tablet 1   nitroGLYCERIN (NITROSTAT) 0.3 MG SL tablet Place 0.3 mg under the tongue every 5 (five) minutes x 3 doses as needed for chest pain.     oxyCODONE (OXY IR/ROXICODONE) 5 MG immediate release tablet  Take 1 tablet (5 mg total) by mouth every 4 (four) hours as needed for severe pain. 30 tablet 0   prednisoLONE acetate (PRED FORTE) 1 % ophthalmic suspension Place 1 drop into the left eye daily.     pregabalin (LYRICA) 50 MG capsule Take 1 capsule (50 mg total) by mouth daily. 90 capsule 1   silodosin (RAPAFLO) 8 MG CAPS capsule Take 8 mg by mouth at bedtime.     terazosin (HYTRIN) 2 MG capsule Take 2 mg by mouth at bedtime.     torsemide (DEMADEX) 20 MG tablet Take 1 tablet (20 mg total) by mouth daily. 90 tablet 1   Facility-Administered Medications Prior to Visit  Medication Dose Route Frequency Provider Last Rate Last Admin   prochlorperazine (COMPAZINE) 10 MG tablet            sodium chloride flush (NS) 0.9 % injection 10 mL  10 mL Intracatheter PRN Ledell Peoples IV, MD       sodium chloride flush (NS) 0.9 % injection 10 mL  10 mL Intravenous Once Orson Slick, MD           Objective:   Physical Exam:  General appearance: 76 y.o., male, NAD, conversant  Eyes: anicteric sclerae; PERRL, tracking appropriately HENT: NCAT; MMM Neck: Trachea midline; no lymphadenopathy, no JVD Lungs: CTAB, no crackles, no wheeze, with normal respiratory effort CV: RRR, no murmur  Abdomen: Soft, non-tender; non-distended, BS present  Extremities: No peripheral edema, warm Skin: Normal turgor and texture; no  rash Psych: Appropriate affect Neuro: Alert and oriented to person and place, no focal deficit     There were no vitals filed for this visit.   on *** LPM *** RA BMI Readings from Last 3 Encounters:  09/09/22 38.40 kg/m  08/13/22 37.29 kg/m  06/21/22 37.27 kg/m   Wt Readings from Last 3 Encounters:  09/09/22 260 lb (117.9 kg)  08/13/22 259 lb 14.4 oz (117.9 kg)  06/21/22 266 lb 3.2 oz (120.7 kg)     CBC    Component Value Date/Time   WBC 4.1 09/09/2022 1241   RBC 4.29 09/09/2022 1241   HGB 11.4 (L) 09/09/2022 1241   HGB 11.6 (L) 08/13/2022 1449   HGB 12.0 (L) 06/21/2022 1640   HCT 34.3 (L) 09/09/2022 1241   HCT 37.4 (L) 06/21/2022 1640   PLT 157 09/09/2022 1241   PLT 188 08/13/2022 1449   PLT 203 06/21/2022 1640   MCV 80.0 09/09/2022 1241   MCV 80 06/21/2022 1640   MCH 26.6 09/09/2022 1241   MCHC 33.2 09/09/2022 1241   RDW 14.4 09/09/2022 1241   RDW 16.5 (H) 06/21/2022 1640   LYMPHSABS 1.0 09/09/2022 1241   MONOABS 0.5 09/09/2022 1241   EOSABS 0.1 09/09/2022 1241   BASOSABS 0.0 09/09/2022 1241    ***  Chest Imaging:  CXR 09/09/22 reviewed by me unremarkable  CT CAP 04/20/22 reviewed by me with stable 92m nodule RML, scattered calcified granulomas  Pulmonary Functions Testing Results:     No data to display            Echocardiogram 01/2022:    1. Left ventricular ejection fraction, by estimation, is 55 to 60%. The  left ventricle has normal function. Left ventricular endocardial border  not optimally defined to evaluate regional wall motion. Left ventricular  diastolic parameters are consistent  with Grade I diastolic dysfunction (impaired relaxation).   2. Right ventricular systolic function is normal. The right ventricular  size is normal. Tricuspid regurgitation signal is inadequate for assessing  PA pressure.   3. Left atrial size was mild to moderately dilated.   4. Right atrial size was mild to moderately dilated.   5. The mitral valve  is normal in structure. No evidence of mitral valve  regurgitation. No evidence of mitral stenosis.   6. The aortic valve is tricuspid. There is moderate calcification of the  aortic valve. Aortic valve regurgitation is not visualized. Mild aortic  valve stenosis. Aortic valve mean gradient measures 13.0 mmHg.   7. Aortic dilatation noted. 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   8. The inferior vena cava is normal in size with greater than 50%  respiratory variability, suggesting right atrial pressure of 3 mmHg.   9. Technically difficult study with poor acoustic windows.    Recent cardiac cath from - Oct 17, 2020 revealed severe coronary artery disease.  Prox Cx lesion is 70% stenosed.  1st Mrg lesion is 70% stenosed.  Mid LAD lesion is 70% stenosed.  1st Diag lesion is 99% stenosed.  Dist RCA lesion is 80% stenosed.  Prox RCA to Mid RCA lesion is 10% stenosed.     Assessment & Plan:    Plan:      Maryjane Hurter, MD [facility] 10/06/2022 12:43 PM

## 2022-10-08 ENCOUNTER — Institutional Professional Consult (permissible substitution): Payer: Medicare HMO | Admitting: Student

## 2022-10-12 ENCOUNTER — Ambulatory Visit (INDEPENDENT_AMBULATORY_CARE_PROVIDER_SITE_OTHER): Payer: Medicare HMO | Admitting: Family Medicine

## 2022-10-12 ENCOUNTER — Encounter (HOSPITAL_BASED_OUTPATIENT_CLINIC_OR_DEPARTMENT_OTHER): Payer: Self-pay | Admitting: Family Medicine

## 2022-10-12 VITALS — BP 161/77 | HR 76 | Temp 97.8°F | Ht 69.0 in | Wt 251.9 lb

## 2022-10-12 DIAGNOSIS — R39198 Other difficulties with micturition: Secondary | ICD-10-CM | POA: Diagnosis not present

## 2022-10-12 DIAGNOSIS — Z794 Long term (current) use of insulin: Secondary | ICD-10-CM

## 2022-10-12 DIAGNOSIS — E1122 Type 2 diabetes mellitus with diabetic chronic kidney disease: Secondary | ICD-10-CM | POA: Diagnosis not present

## 2022-10-12 DIAGNOSIS — M109 Gout, unspecified: Secondary | ICD-10-CM | POA: Diagnosis not present

## 2022-10-12 DIAGNOSIS — N182 Chronic kidney disease, stage 2 (mild): Secondary | ICD-10-CM

## 2022-10-12 DIAGNOSIS — I10 Essential (primary) hypertension: Secondary | ICD-10-CM | POA: Diagnosis not present

## 2022-10-12 MED ORDER — PREGABALIN 50 MG PO CAPS: 50.0000 mg | ORAL_CAPSULE | Freq: Every day | ORAL | 1 refills | Status: DC

## 2022-10-12 NOTE — Patient Instructions (Signed)
  Medication Instructions:  Your physician recommends that you continue on your current medications as directed. Please refer to the Current Medication list given to you today. --If you need a refill on any your medications before your next appointment, please call your pharmacy first. If no refills are authorized on file call the office.-- Lab Work: Your physician has recommended that you have lab work today: No If you have labs (blood work) drawn today and your tests are completely normal, you will receive your results via [facility] a phone call from our staff.  Please ensure you check your voicemail in the event that you authorized detailed messages to be left on a delegated number. If you have any lab test that is abnormal or we need to change your treatment, we will call you to review the results.  Referrals/Procedures/Imaging: No  Follow-Up: Your next appointment:   Your physician recommends that you schedule a follow-up appointment in: 6 weeks follow-up with Dr. de [place]  You will receive a text message or e-mail with a link to a survey about your care and experience with [place] today! We would greatly appreciate your feedback!   Thanks for letting us be apart of your health journey!!  Primary Care and Sports Medicine   Dr. Arlina Robes [place]   We encourage you to activate your patient portal called "MyChart".  Sign up information is provided on this After Visit Summary.  MyChart is used to connect with patients for Virtual Visits (Telemedicine).  Patients are able to view lab/test results, encounter notes, upcoming appointments, etc.  Non-urgent messages can be sent to your provider as well. To learn more about what you can do with MyChart, please visit --  [email or web address].

## 2022-10-12 NOTE — Progress Notes (Signed)
New Patient Office Visit  Subjective    Patient ID: [name], male    DOB: 1946-08-18  Age: 76 Stanley.o. MRN: [id number]  CC:  Chief Complaint  Patient presents with   New Patient (Initial Visit)    Pt here to establish new care, pt requested for all medications to be filled     HPI [name] presents to establish care Last PCP - Dr. Salvadore Dom Accompanied by significant other as well as medical interpreter in office today  DM: No recent A1c, on chart review, last one was 01/2022 and notably elevated at 9.0%. Current medications glipizide, metformin, Lantus 34 units nightly. Reports checking FBG - 90s-120. Has not had recent A1c check.  HTN, CAD, A fib, HLD: Current medications include amiodarone, metoprolol, torsemide. Follows with cardiology, last appt in August.   Gout: Has not had recent uric acid level check. Requesting refill of allopurinol today, has not been taking for about 2 months.  He reports difficulty with urinating, trouble with initiating urination. He did have visit to ED without obvious abnormality reported. It does appear referral was provided at that time, patient seems unaware of this.   Outpatient Encounter Medications as of 10/12/2022  Medication Sig   acetaminophen (TYLENOL) 650 MG CR tablet Take 650 mg by mouth every 8 (eight) hours as needed for pain or fever (headache).   allopurinol (ZYLOPRIM) 300 MG tablet Take 300 mg by mouth at bedtime.   amiodarone (PACERONE) 200 MG tablet Take 200 mg by mouth every morning.   amLODipine (NORVASC) 10 MG tablet Take 10 mg by mouth daily.   apixaban (ELIQUIS) 5 MG TABS tablet Take 5 mg by mouth 2 (two) times daily.   atorvastatin (LIPITOR) 80 MG tablet Take 80 mg by mouth daily.   finasteride (PROSCAR) 5 MG tablet Take 5 mg by mouth at bedtime.   insulin glargine (LANTUS) 100 unit/mL SOPN Inject 34 Units into the skin at bedtime.   isosorbide mononitrate (IMDUR) 30 MG 24 hr tablet Take 30 mg by mouth  every morning.   metFORMIN (GLUCOPHAGE) 1000 MG tablet Take 1 tablet (1,000 mg total) by mouth daily with breakfast.   nitroGLYCERIN (NITROSTAT) 0.3 MG SL tablet Place 0.3 mg under the tongue every 5 (five) minutes x 3 doses as needed for chest pain.   terazosin (HYTRIN) 2 MG capsule Take 2 mg by mouth at bedtime.   torsemide (DEMADEX) 20 MG tablet Take 1 tablet (20 mg total) by mouth daily.   [DISCONTINUED] glipiZIDE (GLUCOTROL) 10 MG tablet Take 10 mg by mouth 2 (two) times daily.   [DISCONTINUED] pregabalin (LYRICA) 50 MG capsule Take 1 capsule (50 mg total) by mouth daily.   metoprolol tartrate (LOPRESSOR) 50 MG tablet Take 1 tablet (50 mg total) by mouth 2 (two) times daily.   pregabalin (LYRICA) 50 MG capsule Take 1 capsule (50 mg total) by mouth daily.   [DISCONTINUED] benzonatate (TESSALON) 100 MG capsule Take 1 capsule (100 mg total) by mouth every 8 (eight) hours.   [DISCONTINUED] clopidogrel (PLAVIX) 75 MG tablet Take 75 mg by mouth daily.   [DISCONTINUED] famotidine (PEPCID) 40 MG tablet Take 0.5 tablets (20 mg total) by mouth daily.   [DISCONTINUED] ferrous sulfate 325 (65 FE) MG EC tablet Take 1 tablet (325 mg total) by mouth daily with breakfast.   [DISCONTINUED] oxyCODONE (OXY IR/ROXICODONE) 5 MG immediate release tablet Take 1 tablet (5 mg total) by mouth every 4 (four) hours as needed for severe pain.   [  DISCONTINUED] prednisoLONE acetate (PRED FORTE) 1 % ophthalmic suspension Place 1 drop into the left eye daily.   [DISCONTINUED] silodosin (RAPAFLO) 8 MG CAPS capsule Take 8 mg by mouth at bedtime.   [DISCONTINUED] prochlorperazine (COMPAZINE) 10 MG tablet    [DISCONTINUED] sodium chloride flush (NS) 0.9 % injection 10 mL    [DISCONTINUED] sodium chloride flush (NS) 0.9 % injection 10 mL    No facility-administered encounter medications on file as of 10/12/2022.    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 per dr Lucianne Lei dam note 06-18-2021   Hematuria 06/18/2021   Hypertension    Sleep apnea    uses cpap   Uses walker 07/24/2021   prn or cane   UTI (urinary tract infection)    finished antibiotics on 07-08-2021   Wound of skin 10/20/2021   sees wound care center for multiple dressing changes    Past Surgical History:  Procedure Laterality Date   CARDIAC CATHETERIZATION     CYSTOSCOPY W/ URETERAL STENT PLACEMENT Right 07/28/2021   Procedure: CYSTOSCOPY WITH RETROGRADE PYELOGRAM/URETERAL STENT EXCHANGE;  Surgeon: [name], MD;  Location: [facility];  Service: Urology;  Laterality: Right;  30 MINS   CYSTOSCOPY W/ URETERAL STENT PLACEMENT Right 10/27/2021   Procedure: CYSTOSCOPY WITH RETROGRADE PYELOGRAM/ URETERAL STENT REMOVAL;  Surgeon: [name], MD;  Location: [facility];  Service: Urology;  Laterality: Right;   CYSTOSCOPY WITH RETROGRADE PYELOGRAM, URETEROSCOPY AND STENT PLACEMENT Right 05/01/2021   Procedure: CYSTOSCOPY WITH RIGHT  RETROGRADE PYELOGRAM,  AND RIGHT STENT PLACEMENT;  Surgeon: Festus Aloe, MD;  [facility];  Service: Urology;  Laterality: Right;   EYE SURGERY     INJECTION OF SILICONE OIL Left 47/82/9562   Procedure: INJECTION OF SILICONE OIL;  Surgeon: Sherlynn Stalls, MD;  Location: [place];  Service: Ophthalmology;  Laterality: Left;   INJECTION OF SILICONE OIL Left 13/06/6577   Procedure: INJECTION OF SILICONE OIL;  Surgeon: Sherlynn Stalls, MD;  Location: [place];  Service: Ophthalmology;  Laterality: Left;   JOINT REPLACEMENT Right    hip   LASER PHOTO ABLATION Left 12/25/2020   Procedure: LASER PHOTO ABLATION;  Surgeon: Sherlynn Stalls, MD;  Location: [place];  Service:  Ophthalmology;  Laterality: Left;   PARS PLANA VITRECTOMY Left 11/10/2020   Procedure: PARS PLANA VITRECTOMY WITH 25 GAUGE;  Surgeon: Sherlynn Stalls, MD;  Location: [place];  Service: Ophthalmology;  Laterality: Left;   PARS PLANA VITRECTOMY Left 12/25/2020   Procedure: PARS PLANA VITRECTOMY WITH 25 GAUGE IN LEFT EYE;  Surgeon: Sherlynn Stalls, MD;  Location: [place];  Service: Ophthalmology;  Laterality: Left;   PERFLUORONE INJECTION Left 12/25/2020   Procedure: PERFLUORONE INJECTION;  Surgeon: Sherlynn Stalls, MD;  Location: [place];  Service: Ophthalmology;  Laterality: Left;   PHOTOCOAGULATION WITH LASER Left 11/10/2020   Procedure: PHOTOCOAGULATION WITH LASER;  Surgeon: Sherlynn Stalls, MD;  Location: [place];  Service: Ophthalmology;  Laterality: Left;   removal of picc line     09-2021   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   Procedure: RETINECTOMY  LEFT EYE;  Surgeon: Sherlynn Stalls, MD;  Location: [place];  Service: Ophthalmology;  Laterality: Left;   SILICON OIL REMOVAL Left 16/08/9603   Procedure: SILICONE OIL REMOVAL;  Surgeon: Sherlynn Stalls, MD;  Location: [place];  Service: Ophthalmology;  Laterality: Left;   TEE WITHOUT CARDIOVERSION N/A 05/29/2021   Procedure: TRANSESOPHAGEAL ECHOCARDIOGRAM (TEE);  Surgeon: Werner Lean, MD;  Location: [facility];  Service: Cardiovascular;  Laterality: N/A;    Family History  Problem Relation Age of Onset   CAD Other     Social History   Socioeconomic History   Marital status: Married    Spouse name: Not on file   Number of children: Not on file   Years of education: Not on file   Highest education level: Not on file  Occupational History   Not on file  Tobacco Use   Smoking status: Never   Smokeless tobacco: Never  Vaping Use   Vaping Use: Never used  Substance and Sexual Activity   Alcohol use: Not Currently   Drug use: Never   Sexual activity: Yes  Other Topics Concern   Not on file  Social  History Narrative   Not on file   Social Determinants of Health   Financial Resource Strain: Not on file  Food Insecurity: Not on file  Transportation Needs: Not on file  Physical Activity: Not on file  Stress: Not on file  Social Connections: Not on file  Intimate Partner Violence: Not on file    Objective    BP (!) 161/77 (BP Location: Right Arm, Patient Position: Sitting, Cuff Size: Large)   Pulse 76   Temp 97.8 F (36.6 C) (Oral)   Ht '5\' 9"'$  (1.753 m)   Wt 251 lb 14.4 oz (114.3 kg)   SpO2 100%   BMI 37.20 kg/m   Physical Exam  76 year old male in no acute distress Cardiovascular exam with regular rate and rhythm Lungs clear to auscultation bilaterally  Assessment & Plan:   Problem List Items Addressed This Visit       Cardiovascular and Mediastinum   HTN (hypertension)    Blood pressure borderline in office today, systolic is above goal, diastolic at goal.  Recommend continued follow-up with cardiology        Endocrine   DM2 (diabetes mellitus, type 2) ([place]) (Chronic)   Relevant Orders   Hemoglobin A1c (Completed)     Other   Difficulty urinating - Primary    Patient was recently seen in emergency department related to this issue and was advised on following up with urology, referral was provided at that time to [facility] urology.  Recommend that patient proceed with this referral, we have also placed referral today to assist with process if needed.  Patient and family member also provided with contact information for [facility] to be able to reach out and schedule appointment      Relevant Orders   Ambulatory referral to Urology   Gout    Reports that he has been without allopurinol for about 2 months.  He has not had recent uric acid level checked.  Denies any recent issues with joint pains or flareups. At this time, we will recheck uric acid level given that he has been without medication, would expect this level to be elevated if gout is  present as true diagnosis. If uric acid is normal, would hold off on restarting medication.  If uric acid level is elevated, we can resume allopurinol at 100 mg  dose      Relevant Orders   Uric acid (Completed)    Return in about 6 weeks (around 11/23/2022).   Chanler Schreiter J De [place], MD

## 2022-10-13 LAB — SPECIMEN STATUS REPORT

## 2022-10-13 LAB — URIC ACID: Uric Acid: 5.5 mg/dL (ref 3.8–8.4)

## 2022-10-13 LAB — HEMOGLOBIN A1C
Est. average glucose Bld gHb Est-mCnc: 197 mg/dL
Hgb A1c MFr Bld: 8.5 % — ABNORMAL HIGH (ref 4.8–5.6)

## 2022-10-18 ENCOUNTER — Telehealth (HOSPITAL_BASED_OUTPATIENT_CLINIC_OR_DEPARTMENT_OTHER): Payer: Self-pay | Admitting: Family Medicine

## 2022-10-18 NOTE — Telephone Encounter (Signed)
Pts daughter called regarding pt could not find there handicap placer paperwork that was supposedly filled out at NP appt on 10/12/22. Pt is wondering if he can get this paper again. Please advise.

## 2022-10-22 NOTE — Assessment & Plan Note (Signed)
Reports that he has been without allopurinol for about 2 months.  He has not had recent uric acid level checked.  Denies any recent issues with joint pains or flareups. At this time, we will recheck uric acid level given that he has been without medication, would expect this level to be elevated if gout is present as true diagnosis. If uric acid is normal, would hold off on restarting medication.  If uric acid level is elevated, we can resume allopurinol at 100 mg dose

## 2022-10-22 NOTE — Assessment & Plan Note (Signed)
Patient was recently seen in emergency department related to this issue and was advised on following up with urology, referral was provided at that time to [facility] urology.  Recommend that patient proceed with this referral, we have also placed referral today to assist with process if needed.  Patient and family member also provided with contact information for [facility] to be able to reach out and schedule appointment

## 2022-10-22 NOTE — Assessment & Plan Note (Signed)
Blood pressure borderline in office today, systolic is above goal, diastolic at goal.  Recommend continued follow-up with cardiology

## 2022-11-03 NOTE — Telephone Encounter (Signed)
Pts daughter is calling for a status on the Handicap Placard-- Please advise and call back [phone]

## 2022-11-12 ENCOUNTER — Ambulatory Visit (HOSPITAL_COMMUNITY)
Admission: RE | Admit: 2022-11-12 | Discharge: 2022-11-12 | Disposition: A | Discharge status: Home / Self Care | Payer: Medicare HMO | Source: Ambulatory Visit | Attending: Physician Assistant | Admitting: Physician Assistant

## 2022-11-12 DIAGNOSIS — E1100 Type 2 diabetes mellitus with hyperosmolarity without nonketotic hyperglycemic-hyperosmolar coma (NKHHC): Secondary | ICD-10-CM | POA: Diagnosis present

## 2022-11-12 DIAGNOSIS — C8338 Diffuse large B-cell lymphoma, lymph nodes of multiple sites: Secondary | ICD-10-CM | POA: Insufficient documentation

## 2022-11-12 LAB — POCT I-STAT CREATININE: Creatinine, Ser: 0.8 mg/dL (ref 0.61–1.24)

## 2022-11-12 MED ORDER — SODIUM CHLORIDE (PF) 0.9 % IJ SOLN
INTRAMUSCULAR | Status: AC
  Filled 2022-11-12: qty 50

## 2022-11-12 MED ORDER — IOHEXOL 300 MG/ML  SOLN
100.0000 mL | Freq: Once | INTRAMUSCULAR | Status: AC | PRN
  Administered 2022-11-12: 100 mL via INTRAVENOUS

## 2022-11-16 ENCOUNTER — Other Ambulatory Visit: Payer: Self-pay

## 2022-11-16 ENCOUNTER — Inpatient Hospital Stay (HOSPITAL_BASED_OUTPATIENT_CLINIC_OR_DEPARTMENT_OTHER): Payer: Medicare HMO | Admitting: Hematology and Oncology

## 2022-11-16 ENCOUNTER — Other Ambulatory Visit: Payer: Self-pay | Admitting: *Deleted

## 2022-11-16 ENCOUNTER — Other Ambulatory Visit: Payer: Self-pay | Admitting: Hematology and Oncology

## 2022-11-16 ENCOUNTER — Inpatient Hospital Stay: Payer: Medicare HMO | Attending: Hematology and Oncology

## 2022-11-16 VITALS — BP 119/69 | HR 71 | Temp 97.4°F | Resp 14 | Wt 233.7 lb

## 2022-11-16 DIAGNOSIS — E1165 Type 2 diabetes mellitus with hyperglycemia: Secondary | ICD-10-CM | POA: Diagnosis not present

## 2022-11-16 DIAGNOSIS — C8338 Diffuse large B-cell lymphoma, lymph nodes of multiple sites: Secondary | ICD-10-CM | POA: Insufficient documentation

## 2022-11-16 DIAGNOSIS — R600 Localized edema: Secondary | ICD-10-CM

## 2022-11-16 DIAGNOSIS — R609 Edema, unspecified: Secondary | ICD-10-CM | POA: Diagnosis not present

## 2022-11-16 DIAGNOSIS — D509 Iron deficiency anemia, unspecified: Secondary | ICD-10-CM | POA: Insufficient documentation

## 2022-11-16 DIAGNOSIS — R350 Frequency of micturition: Secondary | ICD-10-CM | POA: Diagnosis not present

## 2022-11-16 DIAGNOSIS — R161 Splenomegaly, not elsewhere classified: Secondary | ICD-10-CM | POA: Diagnosis not present

## 2022-11-16 DIAGNOSIS — Z7901 Long term (current) use of anticoagulants: Secondary | ICD-10-CM | POA: Diagnosis not present

## 2022-11-16 DIAGNOSIS — I480 Paroxysmal atrial fibrillation: Secondary | ICD-10-CM | POA: Diagnosis not present

## 2022-11-16 DIAGNOSIS — Z7984 Long term (current) use of oral hypoglycemic drugs: Secondary | ICD-10-CM | POA: Insufficient documentation

## 2022-11-16 DIAGNOSIS — D649 Anemia, unspecified: Secondary | ICD-10-CM

## 2022-11-16 LAB — CMP (CANCER CENTER ONLY)
ALT: 21 U/L (ref 0–44)
AST: 26 U/L (ref 15–41)
Albumin: 3 g/dL — ABNORMAL LOW (ref 3.5–5.0)
Alkaline Phosphatase: 80 U/L (ref 38–126)
Anion gap: 9 (ref 5–15)
BUN: 11 mg/dL (ref 8–23)
CO2: 26 mmol/L (ref 22–32)
Calcium: 8.4 mg/dL — ABNORMAL LOW (ref 8.9–10.3)
Chloride: 101 mmol/L (ref 98–111)
Creatinine: 0.98 mg/dL (ref 0.61–1.24)
GFR, Estimated: >60 mL/min (ref >60)
Glucose, Bld: 194 mg/dL — ABNORMAL HIGH (ref 70–99)
Potassium: 3.9 mmol/L (ref 3.5–5.1)
Sodium: 136 mmol/L (ref 135–145)
Total Bilirubin: 0.6 mg/dL (ref 0.3–1.2)
Total Protein: 5.3 g/dL — ABNORMAL LOW (ref 6.5–8.1)

## 2022-11-16 LAB — CBC WITH DIFFERENTIAL (CANCER CENTER ONLY)
Abs Immature Granulocytes: 0.02 10*3/uL (ref 0.00–0.07)
Basophils Absolute: 0 10*3/uL (ref 0.0–0.1)
Basophils Relative: 0 %
Eosinophils Absolute: 0.1 10*3/uL (ref 0.0–0.5)
Eosinophils Relative: 2 %
HCT: 31.7 % — ABNORMAL LOW (ref 39.0–52.0)
Hemoglobin: 10.6 g/dL — ABNORMAL LOW (ref 13.0–17.0)
Immature Granulocytes: 0 %
Lymphocytes Relative: 17 %
Lymphs Abs: 1.1 10*3/uL (ref 0.7–4.0)
MCH: 25.2 pg — ABNORMAL LOW (ref 26.0–34.0)
MCHC: 33.4 g/dL (ref 30.0–36.0)
MCV: 75.5 fL — ABNORMAL LOW (ref 80.0–100.0)
Monocytes Absolute: 0.8 10*3/uL (ref 0.1–1.0)
Monocytes Relative: 12 %
Neutro Abs: 4.3 10*3/uL (ref 1.7–7.7)
Neutrophils Relative %: 69 %
Platelet Count: 221 10*3/uL (ref 150–400)
RBC: 4.2 MIL/uL — ABNORMAL LOW (ref 4.22–5.81)
RDW: 14.9 % (ref 11.5–15.5)
WBC Count: 6.3 10*3/uL (ref 4.0–10.5)
nRBC: 0 % (ref 0.0–0.2)

## 2022-11-16 LAB — RETIC PANEL
Immature Retic Fract: 18.4 % — ABNORMAL HIGH (ref 2.3–15.9)
RBC.: 4.27 MIL/uL (ref 4.22–5.81)
Retic Count, Absolute: 58.5 10*3/uL (ref 19.0–186.0)
Retic Ct Pct: 1.4 % (ref 0.4–3.1)
Reticulocyte Hemoglobin: 26.7 pg — ABNORMAL LOW (ref >27.9)

## 2022-11-16 LAB — IRON AND IRON BINDING CAPACITY (CC-WL,HP ONLY)
Iron: 23 ug/dL — ABNORMAL LOW (ref 45–182)
Saturation Ratios: 11 % — ABNORMAL LOW (ref 17.9–39.5)
TIBC: 218 ug/dL — ABNORMAL LOW (ref 250–450)
UIBC: 195 ug/dL (ref 117–376)

## 2022-11-16 LAB — FERRITIN: Ferritin: 201 ng/mL (ref 24–336)

## 2022-11-16 LAB — LACTATE DEHYDROGENASE: LDH: 216 U/L — ABNORMAL HIGH (ref 98–192)

## 2022-11-16 MED ORDER — FERROUS SULFATE 325 (65 FE) MG PO TABS: 325.0000 mg | ORAL_TABLET | Freq: Every day | ORAL | 3 refills | Status: DC

## 2022-11-16 NOTE — Progress Notes (Signed)
[place] Telephone:([phone]) [phone]   [phone]  PROGRESS NOTE  Patient Care Team: de [place], Blondell Reveal, MD as PCP - General (Family Medicine) Fay Records, MD as PCP - Cardiology (Cardiology) [name], MD (Family Medicine)  Hematological/Oncological History # Diffuse Large B Cell Lymphoma, Stage III/IV 04/30/2021: presented with RLQ abdominal pain. CT abdomen/pelvis shows pathologic mesenteric, retroperitoneal, and pelvic adenopathy, as described above. Moderate splenomegaly. Lobulated retroperitoneal soft tissue masses in the region of the right ureteropelvic junction likely resulting in mild right hydronephrosis secondary to extrinsic mass effect as well as within the right retroperitoneum 05/04/2021: needle core biopsy of right retroperitoneal lymph node shows large B cell lymphoma. 05/13/2021: establish care with Dr. Lorenso Courier 05/23/2021-06/01/2021: admitted with urosepsis. Concern for endocarditis, started on 6 week course abx via PICC line.   06/09/2021-06/11/2021: Cycle 1 of R-CEOP 07/01/2021-07/03/2021: Cycle 2 of R-CEOP 07/22/2021-07/24/2021: Cycle 3 of R-CEOP 08/12/2021-08/14/2021: Cycle 4 of R-CEOP 09/02/2021-09/04/2021: Cycle 5 of R-CEOP 09/07/2021: PET CT scan showed marked interval improvement with respect to nodal disease in the neck, chest, abdomen and pelvis, near complete resolution of visible, lymph nodes. Deauville criteria 2/3. 09/23/2021-09/25/2021: Cycle 6 of R-CEOP 11/04/2021: NM PET CT scan showed no suspicious lymphadenopathy in the neck, chest, abdomen, or pelvis. Deauville criteria 1. Consistent with a complete metabolic response.   Interval History:  [name] 77 y.o. male with medical history significant for stage III/IV diffuse large B-cell lymphoma presents for a follow up visit. The patient's last visit was on 08/13/2022. In the interim since the last visit he has had no major changes in his health.  On exam today, Mr. Lybrand is  accompanied by his wife.  He reports that he is continue to have difficulty with ambulation and has to use a walker and or just remained stable.  He notes that he is "wobbly".  He notes that this is all new status postchemotherapy.  He had a recent hospitalization where this was addressed as well.  He reports that he has not noticed any enlarged lymph nodes.  He is not having any fevers, chills, sweats though he is losing weight.  He had a COVID infection in September and his appetite has been down since then.  He reports very little desire to eat and only eats about 2 small meals per day.  He reports that he is not currently in any pain and he is not currently taking his iron pills as he ran out.  He is having trouble with frequent urination but is being evaluated by urology.  He is started on a medication for this but it is "not helping".  He will be seeing his primary care provider again about this soon.  He denies any fevers, chills, sweats, shortness of breath, chest pain or cough. He has no other complaints. 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 per dr Lucianne Lei dam note 06-18-2021   Hematuria 06/18/2021   Hypertension    Sleep apnea    uses cpap   Uses walker 07/24/2021  prn or cane   UTI (urinary tract infection)    finished antibiotics on 07-08-2021   Wound of skin 10/20/2021   sees wound care center for multiple dressing changes    SURGICAL HISTORY: Past Surgical History:  Procedure Laterality Date   CARDIAC CATHETERIZATION     CYSTOSCOPY W/ URETERAL STENT PLACEMENT Right 07/28/2021   Procedure: CYSTOSCOPY WITH  RETROGRADE PYELOGRAM/URETERAL STENT EXCHANGE;  Surgeon: [name], MD;  Location: [facility];  Service: Urology;  Laterality: Right;  30 MINS   CYSTOSCOPY W/ URETERAL STENT PLACEMENT Right 10/27/2021   Procedure: CYSTOSCOPY WITH RETROGRADE PYELOGRAM/ URETERAL STENT REMOVAL;  Surgeon: [name], MD;  Location: [facility];  Service: Urology;  Laterality: Right;   CYSTOSCOPY WITH RETROGRADE PYELOGRAM, URETEROSCOPY AND STENT PLACEMENT Right 05/01/2021   Procedure: CYSTOSCOPY WITH RIGHT  RETROGRADE PYELOGRAM,  AND RIGHT STENT PLACEMENT;  Surgeon: Festus Aloe, MD;  Location: [facility];  Service: Urology;  Laterality: Right;   EYE SURGERY     INJECTION OF SILICONE OIL Left 74/25/9563   Procedure: INJECTION OF SILICONE OIL;  Surgeon: Sherlynn Stalls, MD;  Location: [place];  Service: Ophthalmology;  Laterality: Left;   INJECTION OF SILICONE OIL Left 87/56/4332   Procedure: INJECTION OF SILICONE OIL;  Surgeon: Sherlynn Stalls, MD;  Location: [place];  Service: Ophthalmology;  Laterality: Left;   JOINT REPLACEMENT Right    hip   LASER PHOTO ABLATION Left 12/25/2020   Procedure: LASER PHOTO ABLATION;  Surgeon: Sherlynn Stalls, MD;  Location: [place];  Service: Ophthalmology;  Laterality: Left;   PARS PLANA VITRECTOMY Left 11/10/2020   Procedure: PARS PLANA VITRECTOMY WITH 25 GAUGE;  Surgeon: Sherlynn Stalls, MD;  Location: [place];  Service: Ophthalmology;  Laterality: Left;   PARS PLANA VITRECTOMY Left 12/25/2020   Procedure: PARS PLANA VITRECTOMY WITH 25 GAUGE IN LEFT EYE;  Surgeon: Sherlynn Stalls, MD;  Location: [place];  Service: Ophthalmology;  Laterality: Left;   PERFLUORONE INJECTION Left 12/25/2020   Procedure: PERFLUORONE INJECTION;  Surgeon: Sherlynn Stalls, MD;  Location: [place];  Service: Ophthalmology;  Laterality: Left;   PHOTOCOAGULATION WITH LASER Left 11/10/2020   Procedure: PHOTOCOAGULATION WITH LASER;  Surgeon: Sherlynn Stalls, MD;  Location: [place];   Service: Ophthalmology;  Laterality: Left;   removal of picc line     09-2021   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   Procedure: RETINECTOMY LEFT EYE;  Surgeon: Sherlynn Stalls, MD;  Location: [place];  Service: Ophthalmology;  Laterality: Left;   SILICON OIL REMOVAL Left 95/18/8416   Procedure: SILICONE OIL REMOVAL;  Surgeon: Sherlynn Stalls, MD;  Location: [place];  Service: Ophthalmology;  Laterality: Left;   TEE WITHOUT CARDIOVERSION N/A 05/29/2021   Procedure: TRANSESOPHAGEAL ECHOCARDIOGRAM (TEE);  Surgeon: Werner Lean, MD;  Location: [facility];  Service: Cardiovascular;  Laterality: N/A;    SOCIAL HISTORY: Social History   Socioeconomic History   Marital status: Married    Spouse name: Not on file   Number of children: Not on file   Years of education: Not on file   Highest education level: Not on file  Occupational History   Not on file  Tobacco Use   Smoking status: Never   Smokeless tobacco: Never  Vaping Use   Vaping Use: Never used  Substance and Sexual Activity   Alcohol use: Not Currently   Drug use: Never   Sexual activity: Yes  Other Topics Concern   Not on file  Social  History Narrative   Not on file   Social Determinants of Health   Financial Resource Strain: Not on file  Food Insecurity: Not on file  Transportation Needs: Not on file  Physical Activity: Not on file  Stress: Not on file  Social Connections: Not on file  Intimate Partner Violence: Not on file    FAMILY HISTORY: Family History  Problem Relation Age of Onset   CAD Other     ALLERGIES:  has No Known Allergies.  MEDICATIONS:  Current Outpatient Medications  Medication Sig Dispense Refill   ferrous sulfate 325 (65 FE) MG tablet Take 1 tablet (325 mg total) by mouth daily with breakfast. Please take with a source of Vitamin C 90 tablet 3   silodosin (RAPAFLO) 8 MG CAPS capsule Take 8 mg by mouth daily.     acetaminophen (TYLENOL) 650 MG CR tablet  Take 650 mg by mouth every 8 (eight) hours as needed for pain or fever (headache).     allopurinol (ZYLOPRIM) 300 MG tablet Take 300 mg by mouth at bedtime.     amiodarone (PACERONE) 200 MG tablet Take 200 mg by mouth every morning.     amLODipine (NORVASC) 10 MG tablet Take 10 mg by mouth daily.     apixaban (ELIQUIS) 5 MG TABS tablet Take 5 mg by mouth 2 (two) times daily.     atorvastatin (LIPITOR) 80 MG tablet Take 80 mg by mouth daily.     finasteride (PROSCAR) 5 MG tablet Take 5 mg by mouth at bedtime.     insulin glargine (LANTUS) 100 unit/mL SOPN Inject 34 Units into the skin at bedtime.     isosorbide mononitrate (IMDUR) 30 MG 24 hr tablet Take 30 mg by mouth every morning.     metFORMIN (GLUCOPHAGE) 1000 MG tablet Take 1 tablet (1,000 mg total) by mouth daily with breakfast. 30 tablet 0   metoprolol tartrate (LOPRESSOR) 50 MG tablet Take 1 tablet (50 mg total) by mouth 2 (two) times daily. 60 tablet 1   nitroGLYCERIN (NITROSTAT) 0.3 MG SL tablet Place 0.3 mg under the tongue every 5 (five) minutes x 3 doses as needed for chest pain.     pregabalin (LYRICA) 50 MG capsule Take 1 capsule (50 mg total) by mouth daily. 90 capsule 1   torsemide (DEMADEX) 20 MG tablet Take 1 tablet (20 mg total) by mouth daily. 90 tablet 1   No current facility-administered medications for this visit.    REVIEW OF SYSTEMS:   Constitutional: ( - ) fevers, ( - )  chills , ( - ) night sweats Eyes: ( - ) blurriness of vision, ( - ) double vision, ( - ) watery eyes Ears, nose, mouth, throat, and face: ( - ) mucositis, ( - ) sore throat Respiratory: ( - ) cough, ( - ) dyspnea, ( - ) wheezes Cardiovascular: ( - ) palpitation, ( - ) chest discomfort, ( +) lower extremity swelling Gastrointestinal:  ( - ) nausea, ( - ) heartburn, ( - ) change in bowel habits Skin: ( - ) abnormal skin rashes Lymphatics: ( - ) new lymphadenopathy, ( - ) easy bruising Neurological: ( - ) numbness, ( - ) tingling, ( - ) new  weaknesses Behavioral/Psych: ( - ) mood change, ( - ) new changes  All other systems were reviewed with the patient and are negative.  PHYSICAL EXAMINATION: ECOG PERFORMANCE STATUS: 1 - Symptomatic but completely ambulatory  Vitals:   11/16/22 1103  BP: 119/69  Pulse: 71  Resp: 14  Temp: (!) 97.4 F (36.3 C)  SpO2: 97%     Filed Weights   11/16/22 1103  Weight: 233 lb 11.2 oz (106 kg)    GENERAL: Well-appearing elderly [place] male, alert, no distress and comfortable SKIN: skin color, texture, turgor are normal, no rashes or significant lesions EYES: conjunctiva are pink and non-injected, sclera clear NECK: supple, non-tender LYMPH:  no palpable lymphadenopathy in the cervical or  clavicular region. LUNGS: clear to auscultation and percussion with normal breathing effort HEART: regular rate & rhythm and no murmurs. 3+ pitting edema bilaterally in the lower extremities below the knee. PSYCH: alert & oriented x 3, fluent speech NEURO: no focal motor/sensory deficits  LABORATORY DATA:  I have reviewed the data as listed    Latest Ref Rng & Units 11/16/2022   10:48 AM 09/09/2022   12:41 PM 08/13/2022    2:49 PM  CBC  WBC 4.0 - 10.5 K/uL 6.3  4.1  7.5   Hemoglobin 13.0 - 17.0 g/dL 10.6  11.4  11.6   Hematocrit 39.0 - 52.0 % 31.7  34.3  34.0   Platelets 150 - 400 K/uL 221  157  188        Latest Ref Rng & Units 11/16/2022   10:48 AM 11/12/2022    1:59 PM 09/09/2022   12:41 PM  CMP  Glucose 70 - 99 mg/dL 194   205   BUN 8 - 23 mg/dL 11   18   Creatinine 0.61 - 1.24 mg/dL 0.98  0.80  1.49   Sodium 135 - 145 mmol/L 136   139   Potassium 3.5 - 5.1 mmol/L 3.9   3.5   Chloride 98 - 111 mmol/L 101   99   CO2 22 - 32 mmol/L 26   25   Calcium 8.9 - 10.3 mg/dL 8.4   8.3   Total Protein 6.5 - 8.1 g/dL 5.3   5.8   Total Bilirubin 0.3 - 1.2 mg/dL 0.6   0.7   Alkaline Phos 38 - 126 U/L 80   83   AST 15 - 41 U/L 26   48   ALT 0 - 44 U/L 21   52      RADIOGRAPHIC  STUDIES: I reviewed the images below. No evidence of fluid in elbow or evidence of fracture   No results found.  ASSESSMENT & PLAN [name] 77 y.o. male with medical history significant for stage III/IV diffuse large B-cell lymphoma presents for a follow up visit.    After review of the labs, review of the records, and discussion with the patient the patients findings are most consistent with advanced stage diffuse large B-cell lymphoma.  PET scan confirmed widespread disease in the neck, chest, abdomen and pelvis. Additionally, there is evidence of splenomegaly and a 5.4 cm soft tissue lesion along the right lower kidney. Patient does have a cardiac history significant for atrial fibrillation on amiodarone and therefore we may need to consider dropping anthracyclines from his regimen. The treatment consists of R-CEOP based regimen moving forward.  Posttreatment PET CT scan on 11/04/2021 showed complete metabolic response to therapy.  DLBCL IPI Score: 2 points, good prognosis. 80% progression free survival    # Diffuse Large B Cell Lymphoma, ABC subtype. Stage III/IV -- Findings at this time are most consistent with stage III/IV diffuse large B-cell lymphoma. --Treatment consisted R-CEOP chemotherapy given his cardiac issues and mildly reduced EF(he is on amiodarone  for atrial fibrillation) --Received 6 cycles of R-CEOP from 06/09/2021-09/26/2021 --Post treatment PET scan from 11/04/2021 showed no suspicious lymphadenopathy in the neck, chest, abdomen, or pelvis. Deauville criteria 1. Consistent with a complete metabolic response. Plan: --labs from today were reviewed. Hgb 10.6, WBC 6.3, MCV 75.5, Plt 221 --continue on surveillance as there is no clinical evidence of recurrence. --RTC in 3 months with labs and CT CAP in 6 months.  #Microcytic anemia --Hgb 10.6, MCV 75.5 --Iron panel checked previously that shows deficiency with serum iron 16, saturation 4%, TIBC 374.  Rechecked  today. --Continue ferrous sulfate 325 mg once a day.  Refilled this today.  #Hematuria, resolved --Likely secondary to anticoagulation (on Eliquis and Plavix), soft tissue lesion along the medial right lower kidney s/p ureteral stent placement.  --patient holding Plavix, continuing eliquis.   #Lower extremity edema -- Patient has bilateral swelling of the legs with pealing of skin and erythema. -- Patient is no long taking IV lasix since recent hospitalization. Currently on torsemide 20 mg PO daily -- Patient denies any shortness of breath or chest discomfort.  -- Patient will follow up with cardiology for any dose changes of diuretics.   #Hyperglycemia: --poorly controlled.  --glucose is 194 today --encouraged patient to stay compliant with DM medications and follow up with PCP.   Orders Placed This Encounter  Procedures   Ferritin    Standing Status:   Future    Standing Expiration Date:   11/17/2023   Iron and Iron Binding Capacity ([facility] only)    Standing Status:   Future    Standing Expiration Date:   11/17/2023   All questions were answered. The patient knows to call the clinic with any problems, questions or concerns.  I have spent a total of 30 minutes minutes of face-to-face and non-face-to-face time, preparing to see the patient, performing a medically appropriate examination, counseling and educating the patient, ordering medications/tests,communicating with other health care professionals, documenting clinical information in the electronic health record, and care coordination.   Ledell Peoples, MD Department of Hematology/Oncology [place] at [facility] Phone: [phone] Pager: [phone] Email: [email or web address]    11/16/2022 1:41 PM

## 2022-11-24 ENCOUNTER — Encounter (HOSPITAL_BASED_OUTPATIENT_CLINIC_OR_DEPARTMENT_OTHER): Payer: Self-pay | Admitting: Family Medicine

## 2022-11-24 ENCOUNTER — Ambulatory Visit (INDEPENDENT_AMBULATORY_CARE_PROVIDER_SITE_OTHER): Payer: Medicare HMO | Admitting: Family Medicine

## 2022-11-24 VITALS — BP 130/79 | HR 79 | Ht 69.0 in | Wt 235.0 lb

## 2022-11-24 DIAGNOSIS — E1122 Type 2 diabetes mellitus with diabetic chronic kidney disease: Secondary | ICD-10-CM | POA: Diagnosis not present

## 2022-11-24 DIAGNOSIS — Z794 Long term (current) use of insulin: Secondary | ICD-10-CM

## 2022-11-24 DIAGNOSIS — G4730 Sleep apnea, unspecified: Secondary | ICD-10-CM

## 2022-11-24 DIAGNOSIS — N182 Chronic kidney disease, stage 2 (mild): Secondary | ICD-10-CM

## 2022-11-24 DIAGNOSIS — R39198 Other difficulties with micturition: Secondary | ICD-10-CM

## 2022-11-24 LAB — POCT URINALYSIS DIPSTICK
Bilirubin, UA: NEGATIVE
Blood, UA: NEGATIVE
Glucose, UA: NEGATIVE
Ketones, UA: NEGATIVE
Nitrite, UA: NEGATIVE
Protein, UA: NEGATIVE
Spec Grav, UA: 1.015 (ref 1.010–1.025)
Urobilinogen, UA: 2 E.U./dL — AB
pH, UA: 5.5 (ref 5.0–8.0)

## 2022-11-24 NOTE — Addendum Note (Signed)
Addended by: Lowella Bandy on: 11/24/2022 03:21 PM   Modules accepted: Orders

## 2022-11-24 NOTE — Assessment & Plan Note (Addendum)
Recent A1c 8.5%, above goal, improved from prior check Continues with lantus 34 units daily, metformin '1000mg'$  once daily; stopped glipizide No reported hypoglycemia; FBG around 110-120 Discussed concerns related to hemoglobin A1c remaining above goal at 8.5%.  We discussed medication options, patient is amenable to adding a half a tablet of metformin in the evening, thus he would be taking 1 past milligrams in the morning and 500 mg at night.  Recommend continuing with insulin glargine at current dose.  We will continue to monitor hemoglobin A1c as well as assessing for any hypoglycemic episodes We will complete nephropathy screening today Complete foot exam at future visit

## 2022-11-24 NOTE — Assessment & Plan Note (Signed)
Patient with history of sleep apnea and requesting referral to sleep medicine provider for follow-up regarding this.  He feels that he needs an updated sleep study completed as well as new CPAP Referral placed to sleep medicine specialist today for further evaluation and recommendations

## 2022-11-24 NOTE — Assessment & Plan Note (Signed)
Patient has had appointment with urology since last office visit with me.  Medications were adjusted at that time and he does have follow-up coming up next week.  He continues to have difficulty with urination, also reports that he has had some discomfort with urinating, this has been going on for the past few months with some slight worsening recently.  No systemic symptoms such as fever, chills, sweats UA completed today which did show some proteinuria COVID-negative nitrites, small amount of leukocyte esterase.  We will plan for urine culture at this time Recommend continuing with regular follow-up with urology for continued management

## 2022-11-24 NOTE — Progress Notes (Signed)
    Procedures performed today:    None.  Independent interpretation of notes and tests performed by another provider:   None.  Brief History, Exam, Impression, and Recommendations:    BP 130/79 (BP Location: Left Arm, Patient Position: Sitting, Cuff Size: Large)   Pulse 79   Ht '5\' 9"'$  (1.753 m)   Wt 235 lb (106.6 kg)   SpO2 100%   BMI 34.70 kg/m   DM2 (diabetes mellitus, type 2) (HCC) Recent A1c 8.5%, above goal, improved from prior check Continues with lantus 34 units daily, metformin '1000mg'$  once daily; stopped glipizide No reported hypoglycemia; FBG around 110-120 Discussed concerns related to hemoglobin A1c remaining above goal at 8.5%.  We discussed medication options, patient is amenable to adding a half a tablet of metformin in the evening, thus he would be taking 1 past milligrams in the morning and 500 mg at night.  Recommend continuing with insulin glargine at current dose.  We will continue to monitor hemoglobin A1c as well as assessing for any hypoglycemic episodes We will complete nephropathy screening today Complete foot exam at future visit  Sleep apnea Patient with history of sleep apnea and requesting referral to sleep medicine provider for follow-up regarding this.  He feels that he needs an updated sleep study completed as well as new CPAP Referral placed to sleep medicine specialist today for further evaluation and recommendations  Difficulty urinating Patient has had appointment with urology since last office visit with me.  Medications were adjusted at that time and he does have follow-up coming up next week.  He continues to have difficulty with urination, also reports that he has had some discomfort with urinating, this has been going on for the past few months with some slight worsening recently.  No systemic symptoms such as fever, chills, sweats UA completed today which did show some proteinuria COVID-negative nitrites, small amount of leukocyte esterase.   We will plan for urine culture at this time Recommend continuing with regular follow-up with urology for continued management  Return in about 8 weeks (around 01/19/2023).   ___________________________________________ Niamya Vittitow de [place], MD, ABFM, CAQSM Primary Care and [place]

## 2022-11-24 NOTE — Patient Instructions (Signed)
  Medication Instructions:  Your physician recommends that you continue on your current medications as directed. Please refer to the Current Medication list given to you today. --If you need a refill on any your medications before your next appointment, please call your pharmacy first. If no refills are authorized on file call the office.-- Lab Work: Your physician has recommended that you have lab work today: Yes If you have labs (blood work) drawn today and your tests are completely normal, you will receive your results via [facility] a phone call from our staff.  Please ensure you check your voicemail in the event that you authorized detailed messages to be left on a delegated number. If you have any lab test that is abnormal or we need to change your treatment, we will call you to review the results.  Referrals/Procedures/Imaging: Yes  Follow-Up: Your next appointment:   Your physician recommends that you schedule a follow-up appointment in: 6-8 weeks with Dr. de [place].  You will receive a text message or e-mail with a link to a survey about your care and experience with [place] today! We would greatly appreciate your feedback!   Thanks for letting us be apart of your health journey!!  Primary Care and Sports Medicine   Dr. Arlina Robes [place]   We encourage you to activate your patient portal called "MyChart".  Sign up information is provided on this After Visit Summary.  MyChart is used to connect with patients for Virtual Visits (Telemedicine).  Patients are able to view lab/test results, encounter notes, upcoming appointments, etc.  Non-urgent messages can be sent to your provider as well. To learn more about what you can do with MyChart, please visit --  [email or web address].

## 2022-11-25 LAB — MICROALBUMIN / CREATININE URINE RATIO
Creatinine, Urine: 123.1 mg/dL
Microalb/Creat Ratio: 102 mg/g creat — ABNORMAL HIGH (ref 0–29)
Microalbumin, Urine: 126 ug/mL

## 2022-11-28 LAB — URINE CULTURE

## 2022-11-29 ENCOUNTER — Other Ambulatory Visit (HOSPITAL_BASED_OUTPATIENT_CLINIC_OR_DEPARTMENT_OTHER): Payer: Self-pay | Admitting: Family Medicine

## 2022-11-29 MED ORDER — NITROFURANTOIN MONOHYD MACRO 100 MG PO CAPS: 100.0000 mg | ORAL_CAPSULE | Freq: Two times a day (BID) | ORAL | 0 refills | Status: AC

## 2022-12-02 ENCOUNTER — Encounter: Payer: Self-pay | Admitting: Hematology and Oncology

## 2022-12-30 ENCOUNTER — Other Ambulatory Visit (HOSPITAL_BASED_OUTPATIENT_CLINIC_OR_DEPARTMENT_OTHER): Payer: Self-pay

## 2022-12-30 ENCOUNTER — Telehealth: Payer: Self-pay

## 2022-12-30 ENCOUNTER — Other Ambulatory Visit: Payer: Self-pay | Admitting: Family Medicine

## 2022-12-30 DIAGNOSIS — I10 Essential (primary) hypertension: Secondary | ICD-10-CM

## 2022-12-30 DIAGNOSIS — I2510 Atherosclerotic heart disease of native coronary artery without angina pectoris: Secondary | ICD-10-CM

## 2022-12-30 DIAGNOSIS — E1122 Type 2 diabetes mellitus with diabetic chronic kidney disease: Secondary | ICD-10-CM

## 2022-12-30 MED ORDER — METOPROLOL TARTRATE 50 MG PO TABS: 50.0000 mg | ORAL_TABLET | Freq: Two times a day (BID) | ORAL | 0 refills | Status: DC

## 2022-12-30 MED ORDER — INSULIN GLARGINE 100 UNITS/ML SOLOSTAR PEN: 34.0000 [IU] | PEN_INJECTOR | Freq: Every day | SUBCUTANEOUS | 0 refills | Status: DC

## 2022-12-30 MED ORDER — APIXABAN 5 MG PO TABS: 5.0000 mg | ORAL_TABLET | Freq: Two times a day (BID) | ORAL | 0 refills | Status: DC

## 2022-12-30 MED ORDER — AMIODARONE HCL 200 MG PO TABS: 200.0000 mg | ORAL_TABLET | Freq: Every morning | ORAL | 0 refills | Status: DC

## 2022-12-30 NOTE — Telephone Encounter (Signed)
I advised the pts daughter Jacqulynn Cadet that I will send in the cardiac meds that she requested but the Proscar should be sent in by his PCP.Marland KitchenMarland KitchenI also advised her that he needs to be seen since he is way overdue.... due 3 weeks after his 06/2022 OV and she says they will be gone out of town for several weeks and declined making an appt at this time and will call in April after they return to make an appt... I strongly urged her to call us back and she agreed.

## 2022-12-30 NOTE — Addendum Note (Signed)
Addended by: Stephani Police on: 12/30/2022 02:51 PM   Modules accepted: Orders

## 2022-12-30 NOTE — Telephone Encounter (Signed)
The patient's daughter Jacqulynn Cadet called and stated that the patient needed refills on his Eliquis, Amiodarone, Finasteride, and metoprolol medications. She was told that I would send the message to Dr. Harrington Challenger' to see if they can be filled under Dr. Harrington Challenger' name.

## 2022-12-30 NOTE — Addendum Note (Signed)
Addended by: Stephani Police on: 12/30/2022 02:59 PM   Modules accepted: Orders

## 2023-01-03 ENCOUNTER — Telehealth (HOSPITAL_BASED_OUTPATIENT_CLINIC_OR_DEPARTMENT_OTHER): Payer: Self-pay

## 2023-01-03 NOTE — Telephone Encounter (Signed)
Pt called daughter called in regards to her dad having issues with urinating. She was told we didn't have any appointment for today and tomorrow because we're filed up. Provider Pecolia Ades and myself told her to take him to urgent care because it can get worse.

## 2023-01-11 ENCOUNTER — Telehealth (HOSPITAL_BASED_OUTPATIENT_CLINIC_OR_DEPARTMENT_OTHER): Payer: Self-pay | Admitting: Family Medicine

## 2023-01-11 NOTE — Telephone Encounter (Signed)
Contacted [name] to schedule their annual wellness visit. Appointment made for 01/18/2023.  Thank you,  [place] Direct dial  [phone]

## 2023-01-18 ENCOUNTER — Encounter (HOSPITAL_BASED_OUTPATIENT_CLINIC_OR_DEPARTMENT_OTHER): Payer: Self-pay

## 2023-01-18 ENCOUNTER — Ambulatory Visit (INDEPENDENT_AMBULATORY_CARE_PROVIDER_SITE_OTHER): Payer: Medicare HMO

## 2023-01-18 VITALS — Ht 69.0 in | Wt 235.0 lb

## 2023-01-18 DIAGNOSIS — Z0000 Encounter for general adult medical examination without abnormal findings: Secondary | ICD-10-CM

## 2023-01-18 NOTE — Progress Notes (Signed)
Subjective:   [name] is a 77 y.o. male who presents for Medicare Annual/Subsequent preventive examination.  Review of Systems    Virtual Visit via Telephone Note  I connected with  [name] on 01/18/23 at  9:15 AM EST by telephone and verified that I am speaking with the correct person using two identifiers.  Location: Patient: Home Provider: Office Persons participating in the virtual visit: patient/Nurse Health Advisor   I discussed the limitations, risks, security and privacy concerns of performing an evaluation and management service by telephone and the availability of in person appointments. The patient expressed understanding and agreed to proceed.  Interactive audio and video telecommunications were attempted between this nurse and patient, however failed, due to patient having technical difficulties OR patient did not have access to video capability.  We continued and completed visit with audio only.  Some vital signs may be absent or patient reported.   Criselda Peaches, LPN  Cardiac Risk Factors include: advanced age (>81mn, >>72women);diabetes mellitus;male gender;hypertension     Objective:    Today's Vitals   01/18/23 0932  Weight: 235 lb (106.6 kg)  Height: '5\' 9"'$  (1.753 m)   Body mass index is 34.7 kg/m.     01/18/2023    9:46 AM 11/16/2022   11:13 AM 09/09/2022   12:16 PM 01/29/2022    6:57 PM 10/27/2021    9:40 AM 09/03/2021    1:41 PM 09/02/2021    9:39 AM  Advanced Directives  Does Patient Have a Medical Advance Directive? No No No No No No No  Would patient like information on creating a medical advance directive? No - Patient declined No - Patient declined  No - Patient declined No - Patient declined No - Patient declined No - Patient declined    Current Medications (verified) Outpatient Encounter Medications as of 01/18/2023  Medication Sig   acetaminophen (TYLENOL) 650 MG CR tablet Take 650 mg by mouth every 8 (eight) hours as  needed for pain or fever (headache).   amiodarone (PACERONE) 200 MG tablet Take 1 tablet (200 mg total) by mouth every morning. Pt needs to make an appt.   amLODipine (NORVASC) 10 MG tablet Take 10 mg by mouth daily.   apixaban (ELIQUIS) 5 MG TABS tablet Take 1 tablet (5 mg total) by mouth 2 (two) times daily. Pt needs to make an appt.   atorvastatin (LIPITOR) 80 MG tablet Take 80 mg by mouth daily.   ferrous sulfate 325 (65 FE) MG tablet Take 1 tablet (325 mg total) by mouth daily with breakfast. Please take with a source of Vitamin C   finasteride (PROSCAR) 5 MG tablet Take 5 mg by mouth at bedtime.   insulin glargine (LANTUS) 100 unit/mL SOPN Inject 34 Units into the skin at bedtime.   isosorbide mononitrate (IMDUR) 30 MG 24 hr tablet Take 30 mg by mouth every morning.   metFORMIN (GLUCOPHAGE) 1000 MG tablet Take 1 tablet (1,000 mg total) by mouth daily with breakfast.   metoprolol tartrate (LOPRESSOR) 50 MG tablet Take 1 tablet (50 mg total) by mouth 2 (two) times daily. Pt needs to make an appt.   nitroGLYCERIN (NITROSTAT) 0.3 MG SL tablet Place 0.3 mg under the tongue every 5 (five) minutes x 3 doses as needed for chest pain.   pregabalin (LYRICA) 50 MG capsule Take 1 capsule (50 mg total) by mouth daily.   silodosin (RAPAFLO) 8 MG CAPS capsule Take 8 mg by mouth daily.  torsemide (DEMADEX) 20 MG tablet Take 1 tablet (20 mg total) by mouth daily.   No facility-administered encounter medications on file as of 01/18/2023.    Allergies (verified) Patient has no known allergies.   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 per dr Lucianne Lei dam note 06-18-2021   Hematuria  06/18/2021   Hypertension    Sleep apnea    uses cpap   Uses walker 07/24/2021   prn or cane   UTI (urinary tract infection)    finished antibiotics on 07-08-2021   Wound of skin 10/20/2021   sees wound care center for multiple dressing changes   Past Surgical History:  Procedure Laterality Date   CARDIAC CATHETERIZATION     CYSTOSCOPY W/ URETERAL STENT PLACEMENT Right 07/28/2021   Procedure: CYSTOSCOPY WITH RETROGRADE PYELOGRAM/URETERAL STENT EXCHANGE;  Surgeon: [name], MD;  Location: [facility];  Service: Urology;  Laterality: Right;  30 MINS   CYSTOSCOPY W/ URETERAL STENT PLACEMENT Right 10/27/2021   Procedure: CYSTOSCOPY WITH RETROGRADE PYELOGRAM/ URETERAL STENT REMOVAL;  Surgeon: [name], MD;  Location: [facility];  Service: Urology;  Laterality: Right;   CYSTOSCOPY WITH RETROGRADE PYELOGRAM, URETEROSCOPY AND STENT PLACEMENT Right 05/01/2021   Procedure: CYSTOSCOPY WITH RIGHT  RETROGRADE PYELOGRAM,  AND RIGHT STENT PLACEMENT;  Surgeon: Festus Aloe, MD;  Location: [facility];  Service: Urology;  Laterality: Right;   EYE SURGERY     INJECTION OF SILICONE OIL Left [id number]   Procedure: INJECTION OF SILICONE OIL;  Surgeon: Sherlynn Stalls, MD;  Location: [place];  Service: Ophthalmology;  Laterality: Left;   INJECTION OF SILICONE OIL Left [id number]   Procedure: INJECTION OF SILICONE OIL;  Surgeon: Sherlynn Stalls, MD;  Location: [place];  Service: Ophthalmology;  Laterality: Left;   JOINT REPLACEMENT Right    hip   LASER PHOTO ABLATION Left 12/25/2020   Procedure: LASER PHOTO ABLATION;  Surgeon: Sherlynn Stalls, MD;  Location: [place];  Service: Ophthalmology;  Laterality: Left;   PARS PLANA VITRECTOMY Left 11/10/2020   Procedure: PARS PLANA VITRECTOMY WITH 25 GAUGE;  Surgeon: Sherlynn Stalls, MD;  Location: [place];  Service: Ophthalmology;  Laterality: Left;   PARS PLANA VITRECTOMY Left 12/25/2020   Procedure: PARS PLANA VITRECTOMY WITH  25 GAUGE IN LEFT EYE;  Surgeon: Sherlynn Stalls, MD;  Location: [place];  Service: Ophthalmology;  Laterality: Left;   PERFLUORONE INJECTION Left 12/25/2020   Procedure: PERFLUORONE INJECTION;  Surgeon: Sherlynn Stalls, MD;  Location: [place];  Service: Ophthalmology;  Laterality: Left;   PHOTOCOAGULATION WITH LASER Left 11/10/2020   Procedure: PHOTOCOAGULATION WITH LASER;  Surgeon: Sherlynn Stalls, MD;  Location: [place];  Service: Ophthalmology;  Laterality: Left;   removal of picc line     09-2021   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   Procedure: RETINECTOMY LEFT EYE;  Surgeon: Sherlynn Stalls, MD;  Location: [place];  Service: Ophthalmology;  Laterality: Left;   SILICON OIL REMOVAL Left [id number]   Procedure: SILICONE OIL REMOVAL;  Surgeon: Sherlynn Stalls, MD;  Location: [place];  Service: Ophthalmology;  Laterality: Left;  TEE WITHOUT CARDIOVERSION N/A 05/29/2021   Procedure: TRANSESOPHAGEAL ECHOCARDIOGRAM (TEE);  Surgeon: Werner Lean, MD;  [facility];  Service: Cardiovascular;  Laterality: N/A;   Family History  Problem Relation Age of Onset   CAD Other    Social History   Socioeconomic History   Marital status: Married    Spouse name: Not on file   Number of children: Not on file   Years of education: Not on file   Highest education level: Not on file  Occupational History   Not on file  Tobacco Use   Smoking status: Never   Smokeless tobacco: Never  Vaping Use   Vaping Use: Never used  Substance and Sexual Activity   Alcohol use: Not Currently   Drug use: Never   Sexual activity: Yes  Other Topics Concern   Not on file  Social History Narrative   Not on file   Social Determinants of Health   Financial Resource Strain: Low Risk  (01/18/2023)   Overall Financial Resource Strain (CARDIA)    Difficulty of Paying Living Expenses: Not hard at all  Food Insecurity: No Food Insecurity (01/18/2023)   Hunger Vital Sign    Worried About  Running Out of Food in the Last Year: Never true    [place] in the Last Year: Never true  Transportation Needs: No Transportation Needs (01/18/2023)   PRAPARE - Hydrologist (Medical): No    Lack of Transportation (Non-Medical): No  Physical Activity: Sufficiently Active (01/18/2023)   Exercise Vital Sign    Days of Exercise per Week: 5 days    Minutes of Exercise per Session: 60 min  Stress: No Stress Concern Present (01/18/2023)   [place]    Feeling of Stress : Not at all  Social Connections: [place] (01/18/2023)   Social Connection and Isolation Panel [NHANES]    Frequency of Communication with Friends and Family: More than three times a week    Frequency of Social Gatherings with Friends and Family: More than three times a week    Attends Religious Services: More than 4 times per year    Active Member of [employer] or Organizations: Yes    Attends Music therapist: More than 4 times per year    Marital Status: Married    Tobacco Counseling Counseling given: Not Answered   Clinical Intake:  Pre-visit preparation completed: No  Pain : No/denies pain   Nutrition Risk Assessment:  Has the patient had any N/V/D within the last 2 months?  No  Does the patient have any non-healing wounds?  No  Has the patient had any unintentional weight loss or weight gain?  No   Diabetes:  Is the patient diabetic?  Yes  If diabetic, was a CBG obtained today?  Yes CBG 112 Taken by patient Did the patient bring in their glucometer from home?  No  How often do you monitor your CBG's? Daily.   Financial Strains and Diabetes Management:  Are you having any financial strains with the device, your supplies or your medication? No .  Does the patient want to be seen by Chronic Care Management for management of their diabetes?  No  Would the patient like to be referred to a  Nutritionist or for Diabetic Management?  No   Diabetic Exams:  Diabetic Eye Exam: Completed No. Overdue for diabetic eye exam. Pt has been advised about the  importance in completing this exam. A referral has been placed today. Message sent to referral coordinator for scheduling purposes. Advised pt to expect a call from office referred to regarding appt.  Diabetic Foot Exam: Completed No. Pt has been advised about the importance in completing this exam. Pt is scheduled for diabetic foot exam on Followed by PCP.    BMI - recorded: 34.7 Nutritional Status: BMI > 30  Obese Nutritional Risks: None Diabetes: Yes CBG done?: Yes (CBG 112 Taken by patient) CBG resulted in Enter/ Edit results?: Yes Did pt. bring in CBG monitor from home?: No  How often do you need to have someone help you when you read instructions, pamphlets, or other written materials from your doctor or pharmacy?: 1 - Never  Diabetic?  Yes  Interpreter Needed?: No  Information entered by :: Rolene Arbour LPN   Activities of Daily Living    01/18/2023    9:40 AM 11/24/2022    1:14 PM  In your present state of health, do you have any difficulty performing the following activities:  Hearing? 1 0  Comment Wears Hearing aids   Vision? 0 0  Difficulty concentrating or making decisions? 0 0  Walking or climbing stairs? 0 0  Dressing or bathing? 0 0  Doing errands, shopping? 0 0  Preparing Food and eating ? N   Using the Toilet? N   In the past six months, have you accidently leaked urine? Y   Comment Pending Urologist appt 01/21/23   Do you have problems with loss of bowel control? N   Managing your Medications? N   Managing your Finances? N   Housekeeping or managing your Housekeeping? N     Patient Care Team: [place], Blondell Reveal, MD as PCP - General (Family Medicine) Fay Records, MD as PCP - Cardiology (Cardiology) [name], MD (Family Medicine)  Indicate any recent Medical Services you may have  received from other than [facility] providers in the past year (date may be approximate).     Assessment:   This is a routine wellness examination for Jakob.  Hearing/Vision screen Hearing Screening - Comments:: Wears hearing aids Vision Screening - Comments:: Wears rx glasses - up to date with routine eye exams with  Dr Baird Cancer  Dietary issues and exercise activities discussed: Current Exercise Habits: Home exercise routine, Type of exercise: walking, Time (Minutes): 60, Frequency (Times/Week): 5, Weekly Exercise (Minutes/Week): 300, Intensity: Moderate, Exercise limited by: None identified   Goals Addressed               This Visit's Progress     Stay Healthy (pt-stated)         Depression Screen    01/18/2023    9:39 AM 11/24/2022    1:13 PM 10/12/2022    2:44 PM  PHQ 2/9 Scores  PHQ - 2 Score 0 0 0  PHQ- 9 Score 0 0 0  Exception Documentation  Medical reason Medical reason    Fall Risk    01/18/2023    9:42 AM 11/24/2022    1:13 PM 10/12/2022    2:44 PM 06/18/2021    4:04 PM  Fall Risk   Falls in the past year? 1 0 0 0  Number falls in past yr: 1 0 0   Injury with Fall? 0 0 0   Comment No injury. No medical attention needed     Risk for fall due to : Impaired balance/gait No Fall Risks No Fall Risks Other (  Comment)  Risk for fall due to: Comment    increased weakness  Follow up Falls prevention discussed Falls evaluation completed Falls evaluation completed Falls evaluation completed;Education provided    FALL RISK PREVENTION PERTAINING TO THE HOME:  Any stairs in or around the home? Yes  If so, are there any without handrails? No  Home free of loose throw rugs in walkways, pet beds, electrical cords, etc? Yes  Adequate lighting in your home to reduce risk of falls? Yes   ASSISTIVE DEVICES UTILIZED TO PREVENT FALLS:  Life alert? No  Use of a cane, walker or w/c? Yes  Grab bars in the bathroom? Yes  Shower chair or bench in shower? Yes  Elevated toilet seat  or a handicapped toilet? No   TIMED UP AND GO:  Was the test performed? No . Audio Visit   Cognitive Function:        01/18/2023    9:46 AM  6CIT Screen  What Year? 0 points  What month? 0 points  What time? 0 points  Count back from 20 0 points  Months in reverse 2 points  Repeat phrase 4 points  Total Score 6 points    Immunizations Immunization History  Administered Date(s) Administered   Tdap 04/16/2019    TDAP status: Up to date  Flu Vaccine status: Declined, Education has been provided regarding the importance of this vaccine but patient still declined. Advised may receive this vaccine at local pharmacy or Health Dept. Aware to provide a copy of the vaccination record if obtained from local pharmacy or Health Dept. Verbalized acceptance and understanding.  Pneumococcal vaccine status: Due, Education has been provided regarding the importance of this vaccine. Advised may receive this vaccine at local pharmacy or Health Dept. Aware to provide a copy of the vaccination record if obtained from local pharmacy or Health Dept. Verbalized acceptance and understanding.  Covid-19 vaccine status: Declined, Education has been provided regarding the importance of this vaccine but patient still declined. Advised may receive this vaccine at local pharmacy or Health Dept.or vaccine clinic. Aware to provide a copy of the vaccination record if obtained from local pharmacy or Health Dept. Verbalized acceptance and understanding.  Qualifies for Shingles Vaccine? Yes   Zostavax completed No   Shingrix Completed?: No.    Education has been provided regarding the importance of this vaccine. Patient has been advised to call insurance company to determine out of pocket expense if they have not yet received this vaccine. Advised may also receive vaccine at local pharmacy or Health Dept. Verbalized acceptance and understanding.  Screening Tests Health Maintenance  Topic Date Due   OPHTHALMOLOGY  EXAM  01/18/2023 (Originally 06/22/1956)   FOOT EXAM  01/19/2023 (Originally 06/22/1956)   COVID-19 Vaccine (1) 02/03/2023 (Originally 06/22/1951)   INFLUENZA VACCINE  02/13/2023 (Originally 06/15/2022)   Zoster Vaccines- Shingrix (1 of 2) 04/20/2023 (Originally 06/22/1965)   Pneumonia Vaccine 54+ Years old (1 of 2 - PCV) 01/18/2024 (Originally 06/22/1952)   COLONOSCOPY (Pts 45-20yr Insurance coverage will need to be confirmed)  01/18/2024 (Originally 06/22/1992)   Hepatitis C Screening  01/18/2024 (Originally 06/22/1964)   HEMOGLOBIN A1C  04/12/2023   Diabetic kidney evaluation - eGFR measurement  11/17/2023   Diabetic kidney evaluation - Urine ACR  11/25/2023   Medicare Annual Wellness (AWV)  01/18/2024   DTaP/Tdap/Td (2 - Td or Tdap) 04/15/2029   HPV VACCINES  Aged Out    Health Maintenance  There are no preventive care reminders to display for  this patient.     Lung Cancer Screening: (Low Dose CT Chest recommended if Age 73-80 years, 30 pack-year currently smoking OR have quit w/in 15years.) does not qualify.     Additional Screening:  Hepatitis C Screening: does qualify; Deferred  Vision Screening: Recommended annual ophthalmology exams for early detection of glaucoma and other disorders of the eye. Is the patient up to date with their annual eye exam?  Yes Who is the provider or what is the name of the office in which the patient attends annual eye exams? Dr Baird Cancer If pt is not established with a provider, would they like to be referred to a provider to establish care? Yes .   Dental Screening: Recommended annual dental exams for proper oral hygiene  Community Resource Referral / Chronic Care Management:  CRR required this visit?  No   CCM required this visit?  No      Plan:     I have personally reviewed and noted the following in the patient's chart:   Medical and social history Use of alcohol, tobacco or illicit drugs  Current medications and supplements including  opioid prescriptions. Patient is not currently taking opioid prescriptions. Functional ability and status Nutritional status Physical activity Advanced directives List of other physicians Hospitalizations, surgeries, and ER visits in previous 12 months Vitals Screenings to include cognitive, depression, and falls Referrals and appointments  In addition, I have reviewed and discussed with patient certain preventive protocols, quality metrics, and best practice recommendations. A written personalized care plan for preventive services as well as general preventive health recommendations were provided to patient.     Criselda Peaches, LPN   [id number]   Nurse Notes: Patient due Hep-C Screening

## 2023-01-18 NOTE — Patient Instructions (Addendum)
[name] , Thank you for taking time to come for your Medicare Wellness Visit. I appreciate your ongoing commitment to your health goals. Please review the following plan we discussed and let me know if I can assist you in the future.   These are the goals we discussed:  Goals       Stay Healthy (pt-stated)        This is a list of the screening recommended for you and due dates:  Health Maintenance  Topic Date Due   Eye exam for diabetics  01/18/2023*   Complete foot exam   01/19/2023*   COVID-19 Vaccine (1) 02/03/2023*   Flu Shot  02/13/2023*   Zoster (Shingles) Vaccine (1 of 2) 04/20/2023*   Pneumonia Vaccine (1 of 2 - PCV) 01/18/2024*   Colon Cancer Screening  01/18/2024*   Hepatitis C Screening: USPSTF Recommendation to screen - Ages 18-79 yo.  01/18/2024*   Hemoglobin A1C  04/12/2023   Yearly kidney function blood test for diabetes  11/17/2023   Yearly kidney health urinalysis for diabetes  11/25/2023   Medicare Annual Wellness Visit  01/18/2024   DTaP/Tdap/Td vaccine (2 - Td or Tdap) 04/15/2029   HPV Vaccine  Aged Out  *Topic was postponed. The date shown is not the original due date.    Advanced directives: Advance directive discussed with you today. Even though you declined this today, please call our office should you change your mind, and we can give you the proper paperwork for you to fill out.   Conditions/risks identified: None  Next appointment: Follow up in one year for your annual wellness visit.   Preventive Care 22 Years and Older, Male  Preventive care refers to lifestyle choices and visits with your health care provider that can promote health and wellness. What does preventive care include? A yearly physical exam. This is also called an annual well check. Dental exams once or twice a year. Routine eye exams. Ask your health care provider how often you should have your eyes checked. Personal lifestyle choices, including: Daily care of your teeth and  gums. Regular physical activity. Eating a healthy diet. Avoiding tobacco and drug use. Limiting alcohol use. Practicing safe sex. Taking low doses of aspirin every day. Taking vitamin and mineral supplements as recommended by your health care provider. What happens during an annual well check? The services and screenings done by your health care provider during your annual well check will depend on your age, overall health, lifestyle risk factors, and family history of disease. Counseling  Your health care provider may ask you questions about your: Alcohol use. Tobacco use. Drug use. Emotional well-being. Home and relationship well-being. Sexual activity. Eating habits. History of falls. Memory and ability to understand (cognition). Work and work Statistician. Screening  You may have the following tests or measurements: Height, weight, and BMI. Blood pressure. Lipid and cholesterol levels. These may be checked every 5 years, or more frequently if you are over 55 years old. Skin check. Lung cancer screening. You may have this screening every year starting at age 86 if you have a 30-pack-year history of smoking and currently smoke or have quit within the past 15 years. Fecal occult blood test (FOBT) of the stool. You may have this test every year starting at age 86. Flexible sigmoidoscopy or colonoscopy. You may have a sigmoidoscopy every 5 years or a colonoscopy every 10 years starting at age 29. Prostate cancer screening. Recommendations will vary depending on your family history  and other risks. Hepatitis C blood test. Hepatitis B blood test. Sexually transmitted disease (STD) testing. Diabetes screening. This is done by checking your blood sugar (glucose) after you have not eaten for a while (fasting). You may have this done every 1-3 years. Abdominal aortic aneurysm (AAA) screening. You may need this if you are a current or former smoker. Osteoporosis. You may be screened  starting at age 73 if you are at high risk. Talk with your health care provider about your test results, treatment options, and if necessary, the need for more tests. Vaccines  Your health care provider may recommend certain vaccines, such as: Influenza vaccine. This is recommended every year. Tetanus, diphtheria, and acellular pertussis (Tdap, Td) vaccine. You may need a Td booster every 10 years. Zoster vaccine. You may need this after age 51. Pneumococcal 13-valent conjugate (PCV13) vaccine. One dose is recommended after age 100. Pneumococcal polysaccharide (PPSV23) vaccine. One dose is recommended after age 19. Talk to your health care provider about which screenings and vaccines you need and how often you need them. This information is not intended to replace advice given to you by your health care provider. Make sure you discuss any questions you have with your health care provider. Document Released: 11/28/2015 Document Revised: 07/21/2016 Document Reviewed: 09/02/2015 Elsevier Interactive Patient Education  2017 [place] Prevention in the Home Falls can cause injuries. They can happen to people of all ages. There are many things you can do to make your home safe and to help prevent falls. What can I do on the outside of my home? Regularly fix the edges of walkways and driveways and fix any cracks. Remove anything that might make you trip as you walk through a door, such as a raised step or threshold. Trim any bushes or trees on the path to your home. Use bright outdoor lighting. Clear any walking paths of anything that might make someone trip, such as rocks or tools. Regularly check to see if handrails are loose or broken. Make sure that both sides of any steps have handrails. Any raised decks and porches should have guardrails on the edges. Have any leaves, snow, or ice cleared regularly. Use sand or salt on walking paths during winter. Clean up any spills in your garage  right away. This includes oil or grease spills. What can I do in the bathroom? Use night lights. Install grab bars by the toilet and in the tub and shower. Do not use towel bars as grab bars. Use non-skid mats or decals in the tub or shower. If you need to sit down in the shower, use a plastic, non-slip stool. Keep the floor dry. Clean up any water that spills on the floor as soon as it happens. Remove soap buildup in the tub or shower regularly. Attach bath mats securely with double-sided non-slip rug tape. Do not have throw rugs and other things on the floor that can make you trip. What can I do in the bedroom? Use night lights. Make sure that you have a light by your bed that is easy to reach. Do not use any sheets or blankets that are too big for your bed. They should not hang down onto the floor. Have a firm chair that has side arms. You can use this for support while you get dressed. Do not have throw rugs and other things on the floor that can make you trip. What can I do in the kitchen? Clean up any spills  right away. Avoid walking on wet floors. Keep items that you use a lot in easy-to-reach places. If you need to reach something above you, use a strong step stool that has a grab bar. Keep electrical cords out of the way. Do not use floor polish or wax that makes floors slippery. If you must use wax, use non-skid floor wax. Do not have throw rugs and other things on the floor that can make you trip. What can I do with my stairs? Do not leave any items on the stairs. Make sure that there are handrails on both sides of the stairs and use them. Fix handrails that are broken or loose. Make sure that handrails are as long as the stairways. Check any carpeting to make sure that it is firmly attached to the stairs. Fix any carpet that is loose or worn. Avoid having throw rugs at the top or bottom of the stairs. If you do have throw rugs, attach them to the floor with carpet tape. Make  sure that you have a light switch at the top of the stairs and the bottom of the stairs. If you do not have them, ask someone to add them for you. What else can I do to help prevent falls? Wear shoes that: Do not have high heels. Have rubber bottoms. Are comfortable and fit you well. Are closed at the toe. Do not wear sandals. If you use a stepladder: Make sure that it is fully opened. Do not climb a closed stepladder. Make sure that both sides of the stepladder are locked into place. Ask someone to hold it for you, if possible. Clearly mark and make sure that you can see: Any grab bars or handrails. First and last steps. Where the edge of each step is. Use tools that help you move around (mobility aids) if they are needed. These include: Canes. Walkers. Scooters. Crutches. Turn on the lights when you go into a dark area. Replace any light bulbs as soon as they burn out. Set up your furniture so you have a clear path. Avoid moving your furniture around. If any of your floors are uneven, fix them. If there are any pets around you, be aware of where they are. Review your medicines with your doctor. Some medicines can make you feel dizzy. This can increase your chance of falling. Ask your doctor what other things that you can do to help prevent falls. This information is not intended to replace advice given to you by your health care provider. Make sure you discuss any questions you have with your health care provider. Document Released: 08/28/2009 Document Revised: 04/08/2016 Document Reviewed: 12/06/2014 Elsevier Interactive Patient Education  2017 [employer].

## 2023-01-20 ENCOUNTER — Encounter (HOSPITAL_BASED_OUTPATIENT_CLINIC_OR_DEPARTMENT_OTHER): Payer: Self-pay | Admitting: Family Medicine

## 2023-01-20 ENCOUNTER — Ambulatory Visit (INDEPENDENT_AMBULATORY_CARE_PROVIDER_SITE_OTHER): Payer: Medicare HMO | Admitting: Family Medicine

## 2023-01-20 VITALS — BP 138/66 | HR 73 | Ht 69.0 in | Wt 235.0 lb

## 2023-01-20 DIAGNOSIS — N182 Chronic kidney disease, stage 2 (mild): Secondary | ICD-10-CM | POA: Diagnosis not present

## 2023-01-20 DIAGNOSIS — Z794 Long term (current) use of insulin: Secondary | ICD-10-CM

## 2023-01-20 DIAGNOSIS — R39198 Other difficulties with micturition: Secondary | ICD-10-CM

## 2023-01-20 DIAGNOSIS — G4730 Sleep apnea, unspecified: Secondary | ICD-10-CM | POA: Diagnosis not present

## 2023-01-20 DIAGNOSIS — E1122 Type 2 diabetes mellitus with diabetic chronic kidney disease: Secondary | ICD-10-CM

## 2023-01-20 MED ORDER — INSULIN GLARGINE 100 UNITS/ML SOLOSTAR PEN: 36.0000 [IU] | PEN_INJECTOR | Freq: Every day | SUBCUTANEOUS | 0 refills | Status: DC

## 2023-01-20 MED ORDER — FINASTERIDE 5 MG PO TABS: 5.0000 mg | ORAL_TABLET | Freq: Every day | ORAL | 0 refills | Status: DC

## 2023-01-20 MED ORDER — METFORMIN HCL 1000 MG PO TABS: 1000.0000 mg | ORAL_TABLET | Freq: Two times a day (BID) | ORAL | 1 refills | Status: DC

## 2023-01-20 MED ORDER — INSULIN PEN NEEDLE 30G X 8 MM MISC: 1.0000 | 6 refills | Status: DC | PRN

## 2023-01-20 NOTE — Progress Notes (Signed)
    Procedures performed today:    None.  Independent interpretation of notes and tests performed by another provider:   None.  Brief History, Exam, Impression, and Recommendations:    BP 138/66 (BP Location: Left Arm, Patient Position: Sitting, Cuff Size: Large)   Pulse 73   Ht  (1.753 m)   Wt 235 lb (106.6 kg)   SpO2 98%   BMI 34.70 kg/m   DM2 (diabetes mellitus, type 2) (HCC) Recent hemoglobin A1c's have been above goal for patient.  He continues with Lantus at 34 units daily.  He did stop glipizide, although continues to have questions as to why this was stopped and whether he needs to resume this medication.  Metformin is being taken once daily, patient reports that he is tolerating this without any side effects or issues.  He is due for recheck of A1c at this time.  He reports that fasting blood sugars at home have been in the mid to high 100s.  Denies any current vision issues, no polyuria or polydipsia We discussed options today, recommend slight increase in dose of Lantus, increasing by 2 units to 36 units daily.  Also recommend increasing metformin to twice daily given that he is tolerating once daily dosing currently We will recheck hemoglobin A1c today Plan to complete foot exam at future office visit  Sleep apnea Patient with history of sleep apnea.  Previously referred to sleep medicine specialist, patient has not been able to schedule appointment as of yet.  Patient was provided with contact information for the office today  Difficulty urinating Patient requesting refill of finasteride.  He has established with urology and has been following up with them.  Symptoms are somewhat improved, however still present.  He does feel that finasteride has been beneficial, can allow for refill today with recommendation for continued follow-up with urology  Return in about 3 months (around 04/22/2023).   ___________________________________________ Willye Javier de [place], MD, ABFM,  CA[facility]

## 2023-01-21 LAB — HEMOGLOBIN A1C
Est. average glucose Bld gHb Est-mCnc: 192 mg/dL
Hgb A1c MFr Bld: 8.3 % — ABNORMAL HIGH (ref 4.8–5.6)

## 2023-01-26 LAB — HM DIABETES EYE EXAM

## 2023-02-14 ENCOUNTER — Other Ambulatory Visit: Payer: Self-pay | Admitting: Hematology and Oncology

## 2023-02-14 DIAGNOSIS — D509 Iron deficiency anemia, unspecified: Secondary | ICD-10-CM

## 2023-02-14 NOTE — Progress Notes (Unsigned)
[place] Telephone:([phone]) [phone]   [phone]  PROGRESS NOTE  Patient Care Team: [place], Blondell Reveal, MD as PCP - General (Family Medicine) Fay Records, MD as PCP - Cardiology (Cardiology) [name], MD (Family Medicine)  Hematological/Oncological History # Diffuse Large B Cell Lymphoma, Stage III/IV 04/30/2021: presented with RLQ abdominal pain. CT abdomen/pelvis shows pathologic mesenteric, retroperitoneal, and pelvic adenopathy, as described above. Moderate splenomegaly. Lobulated retroperitoneal soft tissue masses in the region of the right ureteropelvic junction likely resulting in mild right hydronephrosis secondary to extrinsic mass effect as well as within the right retroperitoneum 05/04/2021: needle core biopsy of right retroperitoneal lymph node shows large B cell lymphoma. 05/13/2021: establish care with Dr. Lorenso Courier 05/23/2021-06/01/2021: admitted with urosepsis. Concern for endocarditis, started on 6 week course abx via PICC line.   06/09/2021-06/11/2021: Cycle 1 of R-CEOP 07/01/2021-07/03/2021: Cycle 2 of R-CEOP 07/22/2021-07/24/2021: Cycle 3 of R-CEOP 08/12/2021-08/14/2021: Cycle 4 of R-CEOP 09/02/2021-09/04/2021: Cycle 5 of R-CEOP 09/07/2021: PET CT scan showed marked interval improvement with respect to nodal disease in the neck, chest, abdomen and pelvis, near complete resolution of visible, lymph nodes. Deauville criteria 2/3. 09/23/2021-09/25/2021: Cycle 6 of R-CEOP 11/04/2021: NM PET CT scan showed no suspicious lymphadenopathy in the neck, chest, abdomen, or pelvis. Deauville criteria 1. Consistent with a complete metabolic response.   Interval History:  [name] 77 y.o. male with medical history significant for stage III/IV diffuse large B-cell lymphoma presents for a follow up visit. The patient's last visit was on 08/13/2022. In the interim since the last visit he has had no major changes in his health.  On exam today, [name] is  accompanied by his wife.  He reports that he is continue to have difficulty with ambulation and has to use a walker and or just remained stable.  He notes that he is "wobbly".  He notes that this is all new status postchemotherapy.  He had a recent hospitalization where this was addressed as well.  He reports that he has not noticed any enlarged lymph nodes.  He is not having any fevers, chills, sweats though he is losing weight.  He had a COVID infection in September and his appetite has been down since then.  He reports very little desire to eat and only eats about 2 small meals per day.  He reports that he is not currently in any pain and he is not currently taking his iron pills as he ran out.  He is having trouble with frequent urination but is being evaluated by urology.  He is started on a medication for this but it is "not helping".  He will be seeing his primary care provider again about this soon.  He denies any fevers, chills, sweats, shortness of breath, chest pain or cough. He has no other complaints. 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 per dr Lucianne Lei dam note 06-18-2021   Hematuria 06/18/2021   Hypertension    Sleep apnea    uses cpap   Uses walker 07/24/2021  prn or cane   UTI (urinary tract infection)    finished antibiotics on 07-08-2021   Wound of skin 10/20/2021   sees wound care center for multiple dressing changes    SURGICAL HISTORY: Past Surgical History:  Procedure Laterality Date   CARDIAC CATHETERIZATION     CYSTOSCOPY W/ URETERAL STENT PLACEMENT Right 07/28/2021   Procedure: CYSTOSCOPY WITH  RETROGRADE PYELOGRAM/URETERAL STENT EXCHANGE;  Surgeon: [name], MD;  Location: [facility];  Service: Urology;  Laterality: Right;  30 MINS   CYSTOSCOPY W/ URETERAL STENT PLACEMENT Right 10/27/2021   Procedure: CYSTOSCOPY WITH RETROGRADE PYELOGRAM/ URETERAL STENT REMOVAL;  Surgeon: [name], MD;  Location: [facility];  Service: Urology;  Laterality: Right;   CYSTOSCOPY WITH RETROGRADE PYELOGRAM, URETEROSCOPY AND STENT PLACEMENT Right 05/01/2021   Procedure: CYSTOSCOPY WITH RIGHT  RETROGRADE PYELOGRAM,  AND RIGHT STENT PLACEMENT;  Surgeon: Festus Aloe, MD;  Location: [facility];  Service: Urology;  Laterality: Right;   EYE SURGERY     INJECTION OF SILICONE OIL Left 74/25/9563   Procedure: INJECTION OF SILICONE OIL;  Surgeon: Sherlynn Stalls, MD;  Location: [place];  Service: Ophthalmology;  Laterality: Left;   INJECTION OF SILICONE OIL Left 87/56/4332   Procedure: INJECTION OF SILICONE OIL;  Surgeon: Sherlynn Stalls, MD;  Location: [place];  Service: Ophthalmology;  Laterality: Left;   JOINT REPLACEMENT Right    hip   LASER PHOTO ABLATION Left 12/25/2020   Procedure: LASER PHOTO ABLATION;  Surgeon: Sherlynn Stalls, MD;  Location: [place];  Service: Ophthalmology;  Laterality: Left;   PARS PLANA VITRECTOMY Left 11/10/2020   Procedure: PARS PLANA VITRECTOMY WITH 25 GAUGE;  Surgeon: Sherlynn Stalls, MD;  Location: [place];  Service: Ophthalmology;  Laterality: Left;   PARS PLANA VITRECTOMY Left 12/25/2020   Procedure: PARS PLANA VITRECTOMY WITH 25 GAUGE IN LEFT EYE;  Surgeon: Sherlynn Stalls, MD;  Location: [place];  Service: Ophthalmology;  Laterality: Left;   PERFLUORONE INJECTION Left 12/25/2020   Procedure: PERFLUORONE INJECTION;  Surgeon: Sherlynn Stalls, MD;  Location: [place];  Service: Ophthalmology;  Laterality: Left;   PHOTOCOAGULATION WITH LASER Left 11/10/2020   Procedure: PHOTOCOAGULATION WITH LASER;  Surgeon: Sherlynn Stalls, MD;  Location: [place];   Service: Ophthalmology;  Laterality: Left;   removal of picc line     09-2021   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   Procedure: RETINECTOMY LEFT EYE;  Surgeon: Sherlynn Stalls, MD;  Location: [place];  Service: Ophthalmology;  Laterality: Left;   SILICON OIL REMOVAL Left 95/18/8416   Procedure: SILICONE OIL REMOVAL;  Surgeon: Sherlynn Stalls, MD;  Location: [place];  Service: Ophthalmology;  Laterality: Left;   TEE WITHOUT CARDIOVERSION N/A 05/29/2021   Procedure: TRANSESOPHAGEAL ECHOCARDIOGRAM (TEE);  Surgeon: Werner Lean, MD;  Location: [facility];  Service: Cardiovascular;  Laterality: N/A;    SOCIAL HISTORY: Social History   Socioeconomic History   Marital status: Married    Spouse name: Not on file   Number of children: Not on file   Years of education: Not on file   Highest education level: Not on file  Occupational History   Not on file  Tobacco Use   Smoking status: Never   Smokeless tobacco: Never  Vaping Use   Vaping Use: Never used  Substance and Sexual Activity   Alcohol use: Not Currently   Drug use: Never   Sexual activity: Yes  Other Topics Concern   Not on file  Social  History Narrative   Not on file   Social Determinants of Health   Financial Resource Strain: Low Risk  (01/18/2023)   Overall Financial Resource Strain (CARDIA)    Difficulty of Paying Living Expenses: Not hard at all  Food Insecurity: No Food Insecurity (01/18/2023)   Hunger Vital Sign    Worried About Running Out of Food in the Last Year: Never true    Ran Out of Food in the Last Year: Never true  Transportation Needs: No Transportation Needs (01/18/2023)   PRAPARE - Hydrologist (Medical): No    Lack of Transportation (Non-Medical): No  Physical Activity: Sufficiently Active (01/18/2023)   Exercise Vital Sign    Days of Exercise per Week: 5 days    Minutes of Exercise per Session: 60 min  Stress: No Stress Concern Present  (01/18/2023)   [place]    Feeling of Stress : Not at all  Social Connections: Rafael Capo (01/18/2023)   Social Connection and Isolation Panel [NHANES]    Frequency of Communication with Friends and Family: More than three times a week    Frequency of Social Gatherings with Friends and Family: More than three times a week    Attends Religious Services: More than 4 times per year    Active Member of [employer] or Organizations: Yes    Attends Music therapist: More than 4 times per year    Marital Status: Married  Human resources officer Violence: Not At Risk (01/18/2023)   Humiliation, Afraid, Rape, and Kick questionnaire    Fear of Current or Ex-Partner: No    Emotionally Abused: No    Physically Abused: No    Sexually Abused: No    FAMILY HISTORY: Family History  Problem Relation Age of Onset   CAD Other     ALLERGIES:  has No Known Allergies.  MEDICATIONS:  Current Outpatient Medications  Medication Sig Dispense Refill   acetaminophen (TYLENOL) 650 MG CR tablet Take 650 mg by mouth every 8 (eight) hours as needed for pain or fever (headache).     amiodarone (PACERONE) 200 MG tablet Take 1 tablet (200 mg total) by mouth every morning. Pt needs to make an appt. 90 tablet 0   amLODipine (NORVASC) 10 MG tablet Take 10 mg by mouth daily.     apixaban (ELIQUIS) 5 MG TABS tablet Take 1 tablet (5 mg total) by mouth 2 (two) times daily. Pt needs to make an appt. 180 tablet 0   atorvastatin (LIPITOR) 80 MG tablet Take 80 mg by mouth daily.     ferrous sulfate 325 (65 FE) MG tablet Take 1 tablet (325 mg total) by mouth daily with breakfast. Please take with a source of Vitamin C 90 tablet 3   finasteride (PROSCAR) 5 MG tablet Take 1 tablet (5 mg total) by mouth at bedtime. 90 tablet 0   insulin glargine (LANTUS) 100 unit/mL SOPN Inject 36 Units into the skin at bedtime. 15 mL 0   Insulin Pen Needle (NOVOFINE) 30G X  8 MM MISC Inject 10 each into the skin as needed. 100 each 6   isosorbide mononitrate (IMDUR) 30 MG 24 hr tablet Take 30 mg by mouth every morning.     metFORMIN (GLUCOPHAGE) 1000 MG tablet Take 1 tablet (1,000 mg total) by mouth 2 (two) times daily with a meal. 180 tablet 1   metoprolol tartrate (LOPRESSOR) 50 MG tablet Take 1 tablet (50 mg  total) by mouth 2 (two) times daily. Pt needs to make an appt. 180 tablet 0   nitroGLYCERIN (NITROSTAT) 0.3 MG SL tablet Place 0.3 mg under the tongue every 5 (five) minutes x 3 doses as needed for chest pain.     pregabalin (LYRICA) 50 MG capsule Take 1 capsule (50 mg total) by mouth daily. 90 capsule 1   silodosin (RAPAFLO) 8 MG CAPS capsule Take 8 mg by mouth daily.     torsemide (DEMADEX) 20 MG tablet Take 1 tablet (20 mg total) by mouth daily. 90 tablet 1   No current facility-administered medications for this visit.    REVIEW OF SYSTEMS:   Constitutional: ( - ) fevers, ( - )  chills , ( - ) night sweats Eyes: ( - ) blurriness of vision, ( - ) double vision, ( - ) watery eyes Ears, nose, mouth, throat, and face: ( - ) mucositis, ( - ) sore throat Respiratory: ( - ) cough, ( - ) dyspnea, ( - ) wheezes Cardiovascular: ( - ) palpitation, ( - ) chest discomfort, ( +) lower extremity swelling Gastrointestinal:  ( - ) nausea, ( - ) heartburn, ( - ) change in bowel habits Skin: ( - ) abnormal skin rashes Lymphatics: ( - ) new lymphadenopathy, ( - ) easy bruising Neurological: ( - ) numbness, ( - ) tingling, ( - ) new weaknesses Behavioral/Psych: ( - ) mood change, ( - ) new changes  All other systems were reviewed with the patient and are negative.  PHYSICAL EXAMINATION: ECOG PERFORMANCE STATUS: 1 - Symptomatic but completely ambulatory  There were no vitals filed for this visit.    There were no vitals filed for this visit.   GENERAL: Well-appearing elderly [place] male, alert, no distress and comfortable SKIN: skin color, texture, turgor are  normal, no rashes or significant lesions EYES: conjunctiva are pink and non-injected, sclera clear NECK: supple, non-tender LYMPH:  no palpable lymphadenopathy in the cervical or  clavicular region. LUNGS: clear to auscultation and percussion with normal breathing effort HEART: regular rate & rhythm and no murmurs. 3+ pitting edema bilaterally in the lower extremities below the knee. PSYCH: alert & oriented x 3, fluent speech NEURO: no focal motor/sensory deficits  LABORATORY DATA:  I have reviewed the data as listed    Latest Ref Rng & Units 11/16/2022   10:48 AM 09/09/2022   12:41 PM 08/13/2022    2:49 PM  CBC  WBC 4.0 - 10.5 K/uL 6.3  4.1  7.5   Hemoglobin 13.0 - 17.0 g/dL 10.6  11.4  11.6   Hematocrit 39.0 - 52.0 % 31.7  34.3  34.0   Platelets 150 - 400 K/uL 221  157  188        Latest Ref Rng & Units 11/16/2022   10:48 AM 11/12/2022    1:59 PM 09/09/2022   12:41 PM  CMP  Glucose 70 - 99 mg/dL 194   205   BUN 8 - 23 mg/dL 11   18   Creatinine 0.61 - 1.24 mg/dL 0.98  0.80  1.49   Sodium 135 - 145 mmol/L 136   139   Potassium 3.5 - 5.1 mmol/L 3.9   3.5   Chloride 98 - 111 mmol/L 101   99   CO2 22 - 32 mmol/L 26   25   Calcium 8.9 - 10.3 mg/dL 8.4   8.3   Total Protein 6.5 - 8.1 g/dL 5.3   5.8  Total Bilirubin 0.3 - 1.2 mg/dL 0.6   0.7   Alkaline Phos 38 - 126 U/L 80   83   AST 15 - 41 U/L 26   48   ALT 0 - 44 U/L 21   52      RADIOGRAPHIC STUDIES: I reviewed the images below. No evidence of fluid in elbow or evidence of fracture   No results found.  ASSESSMENT & PLAN METIN OSWALD 77 y.o. male with medical history significant for stage III/IV diffuse large B-cell lymphoma presents for a follow up visit.    After review of the labs, review of the records, and discussion with the patient the patients findings are most consistent with advanced stage diffuse large B-cell lymphoma.  PET scan confirmed widespread disease in the neck, chest, abdomen and pelvis.  Additionally, there is evidence of splenomegaly and a 5.4 cm soft tissue lesion along the right lower kidney. Patient does have a cardiac history significant for atrial fibrillation on amiodarone and therefore we may need to consider dropping anthracyclines from his regimen. The treatment consists of R-CEOP based regimen moving forward.  Posttreatment PET CT scan on 11/04/2021 showed complete metabolic response to therapy.  DLBCL IPI Score: 2 points, good prognosis. 80% progression free survival    # Diffuse Large B Cell Lymphoma, ABC subtype. Stage III/IV -- Findings at this time are most consistent with stage III/IV diffuse large B-cell lymphoma. --Treatment consisted R-CEOP chemotherapy given his cardiac issues and mildly reduced EF(he is on amiodarone for atrial fibrillation) --Received 6 cycles of R-CEOP from 06/09/2021-09/26/2021 --Post treatment PET scan from 11/04/2021 showed no suspicious lymphadenopathy in the neck, chest, abdomen, or pelvis. Deauville criteria 1. Consistent with a complete metabolic response. Plan: --labs from today were reviewed and showed *** --continue on surveillance as there is no clinical evidence of recurrence. --last CT scan in Dec 2023. Repeat due in June 2024.  --RTC in 3 months with labs and CT CAP in June   #Microcytic anemia --Hgb *** --Iron panel checked previously that shows deficiency with serum iron 16, saturation 4%, TIBC 374.  Rechecked today. *** --Continue ferrous sulfate 325 mg once a day.  Refilled this today.  #Hematuria, resolved --Likely secondary to anticoagulation (on Eliquis and Plavix), soft tissue lesion along the medial right lower kidney s/p ureteral stent placement.  --patient holding Plavix, continuing eliquis.   #Lower extremity edema -- Patient has bilateral swelling of the legs with pealing of skin and erythema. -- Patient is no long taking IV lasix since recent hospitalization. Currently on torsemide 20 mg PO daily --  Patient denies any shortness of breath or chest discomfort.  -- Patient will follow up with cardiology for any dose changes of diuretics.   #Hyperglycemia: --poorly controlled.  --glucose is *** today --encouraged patient to stay compliant with DM medications and follow up with PCP.   No orders of the defined types were placed in this encounter.  All questions were answered. The patient knows to call the clinic with any problems, questions or concerns.  I have spent a total of 30 minutes minutes of face-to-face and non-face-to-face time, preparing to see the patient, performing a medically appropriate examination, counseling and educating the patient, ordering medications/tests,communicating with other health care professionals, documenting clinical information in the electronic health record, and care coordination.   Ledell Peoples, MD Department of Hematology/Oncology [place] [facility] Phone: [phone] Pager: [phone] Email: [email or web address]    02/14/2023 2:55 PM

## 2023-02-15 ENCOUNTER — Inpatient Hospital Stay: Payer: Medicare HMO | Attending: Hematology and Oncology

## 2023-02-15 ENCOUNTER — Inpatient Hospital Stay (HOSPITAL_BASED_OUTPATIENT_CLINIC_OR_DEPARTMENT_OTHER): Payer: Medicare HMO | Admitting: Hematology and Oncology

## 2023-02-15 ENCOUNTER — Other Ambulatory Visit: Payer: Self-pay

## 2023-02-15 ENCOUNTER — Telehealth: Payer: Self-pay | Admitting: Hematology and Oncology

## 2023-02-15 VITALS — BP 145/77 | HR 70 | Temp 98.5°F | Resp 18 | Wt 228.3 lb

## 2023-02-15 DIAGNOSIS — R161 Splenomegaly, not elsewhere classified: Secondary | ICD-10-CM | POA: Insufficient documentation

## 2023-02-15 DIAGNOSIS — R600 Localized edema: Secondary | ICD-10-CM | POA: Diagnosis not present

## 2023-02-15 DIAGNOSIS — D649 Anemia, unspecified: Secondary | ICD-10-CM | POA: Diagnosis not present

## 2023-02-15 DIAGNOSIS — G4730 Sleep apnea, unspecified: Secondary | ICD-10-CM | POA: Insufficient documentation

## 2023-02-15 DIAGNOSIS — R609 Edema, unspecified: Secondary | ICD-10-CM | POA: Diagnosis not present

## 2023-02-15 DIAGNOSIS — D509 Iron deficiency anemia, unspecified: Secondary | ICD-10-CM | POA: Insufficient documentation

## 2023-02-15 DIAGNOSIS — R739 Hyperglycemia, unspecified: Secondary | ICD-10-CM | POA: Diagnosis not present

## 2023-02-15 DIAGNOSIS — I10 Essential (primary) hypertension: Secondary | ICD-10-CM | POA: Insufficient documentation

## 2023-02-15 DIAGNOSIS — I2510 Atherosclerotic heart disease of native coronary artery without angina pectoris: Secondary | ICD-10-CM | POA: Diagnosis not present

## 2023-02-15 DIAGNOSIS — M7989 Other specified soft tissue disorders: Secondary | ICD-10-CM | POA: Insufficient documentation

## 2023-02-15 DIAGNOSIS — Z87440 Personal history of urinary (tract) infections: Secondary | ICD-10-CM | POA: Diagnosis not present

## 2023-02-15 DIAGNOSIS — Z7901 Long term (current) use of anticoagulants: Secondary | ICD-10-CM | POA: Insufficient documentation

## 2023-02-15 DIAGNOSIS — E119 Type 2 diabetes mellitus without complications: Secondary | ICD-10-CM | POA: Diagnosis not present

## 2023-02-15 DIAGNOSIS — Z79899 Other long term (current) drug therapy: Secondary | ICD-10-CM | POA: Diagnosis not present

## 2023-02-15 DIAGNOSIS — Z8249 Family history of ischemic heart disease and other diseases of the circulatory system: Secondary | ICD-10-CM | POA: Diagnosis not present

## 2023-02-15 DIAGNOSIS — C8338 Diffuse large B-cell lymphoma, lymph nodes of multiple sites: Secondary | ICD-10-CM

## 2023-02-15 LAB — CBC WITH DIFFERENTIAL (CANCER CENTER ONLY)
Abs Immature Granulocytes: 0.04 10*3/uL (ref 0.00–0.07)
Basophils Absolute: 0 10*3/uL (ref 0.0–0.1)
Basophils Relative: 0 %
Eosinophils Absolute: 0.3 10*3/uL (ref 0.0–0.5)
Eosinophils Relative: 3 %
HCT: 34.5 % — ABNORMAL LOW (ref 39.0–52.0)
Hemoglobin: 11.1 g/dL — ABNORMAL LOW (ref 13.0–17.0)
Immature Granulocytes: 1 %
Lymphocytes Relative: 23 %
Lymphs Abs: 1.9 10*3/uL (ref 0.7–4.0)
MCH: 24.4 pg — ABNORMAL LOW (ref 26.0–34.0)
MCHC: 32.2 g/dL (ref 30.0–36.0)
MCV: 76 fL — ABNORMAL LOW (ref 80.0–100.0)
Monocytes Absolute: 0.8 10*3/uL (ref 0.1–1.0)
Monocytes Relative: 9 %
Neutro Abs: 5.5 10*3/uL (ref 1.7–7.7)
Neutrophils Relative %: 64 %
Platelet Count: 246 10*3/uL (ref 150–400)
RBC: 4.54 MIL/uL (ref 4.22–5.81)
RDW: 16.9 % — ABNORMAL HIGH (ref 11.5–15.5)
WBC Count: 8.5 10*3/uL (ref 4.0–10.5)
nRBC: 0 % (ref 0.0–0.2)

## 2023-02-15 LAB — CMP (CANCER CENTER ONLY)
ALT: 18 U/L (ref 0–44)
AST: 18 U/L (ref 15–41)
Albumin: 3.5 g/dL (ref 3.5–5.0)
Alkaline Phosphatase: 100 U/L (ref 38–126)
Anion gap: 9 (ref 5–15)
BUN: 24 mg/dL — ABNORMAL HIGH (ref 8–23)
CO2: 24 mmol/L (ref 22–32)
Calcium: 9.1 mg/dL (ref 8.9–10.3)
Chloride: 109 mmol/L (ref 98–111)
Creatinine: 0.99 mg/dL (ref 0.61–1.24)
GFR, Estimated: >60 mL/min (ref >60)
Glucose, Bld: 182 mg/dL — ABNORMAL HIGH (ref 70–99)
Potassium: 4.2 mmol/L (ref 3.5–5.1)
Sodium: 142 mmol/L (ref 135–145)
Total Bilirubin: 0.5 mg/dL (ref 0.3–1.2)
Total Protein: 5.8 g/dL — ABNORMAL LOW (ref 6.5–8.1)

## 2023-02-15 LAB — IRON AND IRON BINDING CAPACITY (CC-WL,HP ONLY)
Iron: 29 ug/dL — ABNORMAL LOW (ref 45–182)
Saturation Ratios: 9 % — ABNORMAL LOW (ref 17.9–39.5)
TIBC: 314 ug/dL (ref 250–450)
UIBC: 285 ug/dL (ref 117–376)

## 2023-02-15 LAB — FERRITIN: Ferritin: 73 ng/mL (ref 24–336)

## 2023-02-15 LAB — RETIC PANEL
Immature Retic Fract: 23.4 % — ABNORMAL HIGH (ref 2.3–15.9)
RBC.: 4.49 MIL/uL (ref 4.22–5.81)
Retic Count, Absolute: 57 10*3/uL (ref 19.0–186.0)
Retic Ct Pct: 1.3 % (ref 0.4–3.1)
Reticulocyte Hemoglobin: 27.6 pg — ABNORMAL LOW (ref >27.9)

## 2023-02-15 LAB — LACTATE DEHYDROGENASE: LDH: 179 U/L (ref 98–192)

## 2023-02-15 NOTE — Telephone Encounter (Signed)
Checked out with scheduler, patient received after visit summary and updated calendar with new appointments.

## 2023-03-07 NOTE — Assessment & Plan Note (Signed)
Recent hemoglobin A1c's have been above goal for patient.  He continues with Lantus at 34 units daily.  He did stop glipizide, although continues to have questions as to why this was stopped and whether he needs to resume this medication.  Metformin is being taken once daily, patient reports that he is tolerating this without any side effects or issues.  He is due for recheck of A1c at this time.  He reports that fasting blood sugars at home have been in the mid to high 100s.  Denies any current vision issues, no polyuria or polydipsia We discussed options today, recommend slight increase in dose of Lantus, increasing by 2 units to 36 units daily.  Also recommend increasing metformin to twice daily given that he is tolerating once daily dosing currently We will recheck hemoglobin A1c today Plan to complete foot exam at future office visit

## 2023-03-07 NOTE — Assessment & Plan Note (Signed)
Patient with history of sleep apnea.  Previously referred to sleep medicine specialist, patient has not been able to schedule appointment as of yet.  Patient was provided with contact information for the office today

## 2023-03-07 NOTE — Assessment & Plan Note (Signed)
Patient requesting refill of finasteride.  He has established with urology and has been following up with them.  Symptoms are somewhat improved, however still present.  He does feel that finasteride has been beneficial, can allow for refill today with recommendation for continued follow-up with urology

## 2023-04-01 DIAGNOSIS — R3914 Feeling of incomplete bladder emptying: Secondary | ICD-10-CM | POA: Diagnosis not present

## 2023-04-01 DIAGNOSIS — M48062 Spinal stenosis, lumbar region with neurogenic claudication: Secondary | ICD-10-CM | POA: Diagnosis not present

## 2023-04-01 DIAGNOSIS — N401 Enlarged prostate with lower urinary tract symptoms: Secondary | ICD-10-CM | POA: Diagnosis not present

## 2023-04-07 ENCOUNTER — Telehealth: Payer: Self-pay | Admitting: Internal Medicine

## 2023-04-07 NOTE — Telephone Encounter (Signed)
Patient with diagnosis of afib on Eliquis for anticoagulation.    Procedure: rezum Date of procedure: 06/27/23  CHA2DS2-VASc Score = 6  This indicates a 9.7% annual risk of stroke. The patient's score is based upon: CHF History: 1 HTN History: 1 Diabetes History: 1 Stroke History: 0 Vascular Disease History: 1 Age Score: 2 Gender Score: 0  CrCl 61mL/min using adj body weight Platelet count 246K  Per office protocol, patient can hold Eliquis for 2 days prior to procedure.    **This guidance is not considered finalized until pre-operative APP has relayed final recommendations.**

## 2023-04-07 NOTE — Telephone Encounter (Signed)
   Pre-operative Risk Assessment    Patient Name: [name]  DOB: 09/15/1946 MRN: [id number]      Request for Surgical Clearance   Rezum Procedure:    Date of Surgery:  Clearance 06-27-23                                  Surgeon:   Dr Jerilee Field Surgeon's Group or Practice Name:   Phone number:  [phone] Fax number:  [phone]   Type of Clearance Requested:   - Pharmacy:  Hold Apixaban (Eliquis)     Type of Anesthesia:   Nitrous   Additional requests/questions:    Signed, Laurence Ferrari   04/07/2023, 8:31 AM

## 2023-04-07 NOTE — Telephone Encounter (Signed)
   Patient Name: [name]  DOB: Apr 09, 1946 MRN: [id number]  Primary Cardiologist: Dietrich Pates, MD  Clinical pharmacists have reviewed the patient's past medical history, labs, and current medications as part of preoperative protocol coverage. The following recommendations have been made:  Per office protocol, patient can hold Eliquis for 2 days prior to procedure.     I will route this recommendation to the requesting party via Epic fax function and remove from pre-op pool.  Please call with questions.  Napoleon Form, Leodis Rains, NP 04/07/2023, 1:35 PM

## 2023-04-07 NOTE — Telephone Encounter (Signed)
Pharmacy please advise on holding Eliquis prior to Rezum procedure scheduled for 06/27/2023. Thank you.

## 2023-04-08 ENCOUNTER — Other Ambulatory Visit: Payer: Self-pay | Admitting: Internal Medicine

## 2023-04-12 ENCOUNTER — Telehealth (HOSPITAL_BASED_OUTPATIENT_CLINIC_OR_DEPARTMENT_OTHER): Payer: Self-pay | Admitting: Family Medicine

## 2023-04-12 NOTE — Telephone Encounter (Signed)
Daughter called needing amlodipine needs 3 only 2 was refilled and  Lantus insuline glargine is also needed.

## 2023-04-13 ENCOUNTER — Telehealth (HOSPITAL_BASED_OUTPATIENT_CLINIC_OR_DEPARTMENT_OTHER): Payer: Self-pay | Admitting: Family Medicine

## 2023-04-13 ENCOUNTER — Other Ambulatory Visit (HOSPITAL_BASED_OUTPATIENT_CLINIC_OR_DEPARTMENT_OTHER): Payer: Self-pay

## 2023-04-13 DIAGNOSIS — Z794 Long term (current) use of insulin: Secondary | ICD-10-CM

## 2023-04-13 DIAGNOSIS — I10 Essential (primary) hypertension: Secondary | ICD-10-CM

## 2023-04-13 MED ORDER — AMLODIPINE BESYLATE 10 MG PO TABS
10.0000 mg | ORAL_TABLET | Freq: Every day | ORAL | 3 refills | Status: DC
Start: 2023-04-13 — End: 2024-02-15

## 2023-04-13 MED ORDER — INSULIN GLARGINE 100 UNITS/ML SOLOSTAR PEN
36.0000 [IU] | PEN_INJECTOR | Freq: Every day | SUBCUTANEOUS | 0 refills | Status: DC
Start: 2023-04-13 — End: 2023-04-22

## 2023-04-13 NOTE — Telephone Encounter (Signed)
Pt refills has been sent to the pharmacy .

## 2023-04-13 NOTE — Telephone Encounter (Signed)
Please call in   insulin glargine insulin glargine (LANTUS) 100 unit/mL SOPN  [facility] needs a Prescription

## 2023-04-13 NOTE — Telephone Encounter (Signed)
Pt came in and picked up paper prescription for this.

## 2023-04-19 ENCOUNTER — Other Ambulatory Visit: Payer: Self-pay | Admitting: Internal Medicine

## 2023-04-19 ENCOUNTER — Ambulatory Visit (INDEPENDENT_AMBULATORY_CARE_PROVIDER_SITE_OTHER): Payer: Medicare HMO | Admitting: Neurology

## 2023-04-19 ENCOUNTER — Encounter: Payer: Self-pay | Admitting: Neurology

## 2023-04-19 VITALS — BP 156/74 | HR 65 | Ht 69.0 in | Wt 248.2 lb

## 2023-04-19 DIAGNOSIS — R351 Nocturia: Secondary | ICD-10-CM

## 2023-04-19 DIAGNOSIS — G4733 Obstructive sleep apnea (adult) (pediatric): Secondary | ICD-10-CM

## 2023-04-19 DIAGNOSIS — E669 Obesity, unspecified: Secondary | ICD-10-CM | POA: Diagnosis not present

## 2023-04-19 DIAGNOSIS — G4719 Other hypersomnia: Secondary | ICD-10-CM | POA: Diagnosis not present

## 2023-04-19 DIAGNOSIS — I480 Paroxysmal atrial fibrillation: Secondary | ICD-10-CM

## 2023-04-19 NOTE — Patient Instructions (Signed)
It was nice to meet you today.   As discussed, we will proceed with a home sleep test (HST) to re-assess your sleep apnea and to see if you still should use an utoPAP machine. Our sleep lab staff will reach out to you to arrange for pickup and for tutorial of your test equipment - you will do the test at home that night and bring the test sensors back for data analysis the next day or whenever you are scheduled for drop off of your test equipment. I will write for a new machine after your HST confirms your obstructive sleep apnea diagnosis.   Please remember, you will not use your autoPAP machine the night of your testing.  This is so we get diagnostic data, we do not need treatment data.  We will call you with the results.   After you have done your home sleep test, you can resume using your AutoPap machine.    Please continue using your PAP regularly. While your insurance requires that you use PAP at least 4 hours each night on 70% of the nights, I recommend, that you not skip any nights and use it throughout the night if you can. Getting used to CPAP or AutoPap and staying with the treatment long term does take time and patience and discipline. Untreated obstructive sleep apnea when it is moderate to severe can have an adverse impact on cardiovascular health and raise her risk for heart disease, arrhythmias, hypertension, congestive heart failure, stroke and diabetes. Untreated obstructive sleep apnea causes sleep disruption, nonrestorative sleep, and sleep deprivation. This can have an impact on your day to day functioning and cause daytime sleepiness and impairment of cognitive function, memory loss, mood disturbance, and problems focussing. Using CPAP/AutoPap regularly can improve these symptoms.  Please continue to work on weight loss.

## 2023-04-19 NOTE — Telephone Encounter (Signed)
Eliquis 5mg  refill request received. Patient is 77 years old, weight-112.6kg, Crea-0.99 on 02/15/23, Diagnosis-Afib, and last seen by Dr. Tenny Craw on 06/21/22. Dose is appropriate based on dosing criteria. Will send in refill to requested pharmacy.

## 2023-04-19 NOTE — Progress Notes (Signed)
Subjective:    Patient ID: [name] is a 77 y.o. male.  HPI    Huston Foley, MD, PhD [facility] [address], [address] [address] [place], [place] [address]  Dear Dr. de Peru,  I saw your patient, [name], upon your kind request in my sleep clinic today for initial consultation of his sleep disorder, in particular, evaluation of his prior diagnosis of obstructive sleep apnea.  The patient is accompanied by his wife and a [place] interpreter, [name] today.  The patient himself speaks [place] and [place] but also some [place]. As you know, [name] is a 77 year old male with an underlying medical history of diabetes, chronic kidney disease, coronary artery disease, diffuse large B-cell lymphoma, endocarditis, hypertension, and obesity, who was previously diagnosed with obstructive sleep apnea and placed on PAP therapy.  Prior sleep study results are not available for my review today.  I was able to review his PAP compliance data from the past 30 days.  His Epworth sleepiness score is 14 out of 24, fatigue severity score is 9 out of 63.   He has an older Scientist, forensic.  He has been compliant with treatment, between 03/20/2023 and 04/18/2023 he used his machine 29 out of 30 days with percent use days greater than 4 hours at 96.7%, indicating excellent compliance with an average usage for days on treatment of 6 hours and 45 minutes, residual AHI elevated in the mild to maybe borderline moderate range at 14.5/h, 90th percentile of pressure at 10.5 cm.  He is AutoPap is set for a minimum pressure of 7 cm and maximum at 10.5 cm.  He does not have a high leak from his mask, he indicates that he uses a fullface mask.  He has been compliant with treatment, sleep study testing was years ago, probably close to 11 years ago.  He was in [place].  He moved to [place] area with his wife, they have a daughter who lives here.  They have a  total of 8 children, 33 grandchildren and 8 great-grandchildren.  Bedtime is generally between 10 and 11 and rise time between 6:30 AM and 7 AM.  He does have nocturia about 2-3 times per average night.  He drinks caffeine in the form of tea, 1 cup in the morning, no alcohol, and he is a non-smoker.  He denies recurrent morning headaches or nocturnal headaches.  He had a tonsillectomy as a child.  He would be willing to continue with PAP therapy but would like a new machine.    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 per dr Zenaida Niece dam note 06-18-2021   Hematuria 06/18/2021   Hypertension    Sleep apnea    uses cpap   Uses walker 07/24/2021   prn or cane   UTI (urinary tract infection)    finished antibiotics on 07-08-2021   Wound of skin 10/20/2021   sees wound care center for multiple dressing changes    His Past Surgical History Is Significant For: Past Surgical  History:  Procedure Laterality Date   CARDIAC CATHETERIZATION     CYSTOSCOPY W/ URETERAL STENT PLACEMENT Right 07/28/2021   Procedure: CYSTOSCOPY WITH RETROGRADE PYELOGRAM/URETERAL STENT EXCHANGE;  Surgeon: Belva Agee, MD;  Location: [facility];  Service: Urology;  Laterality: Right;  30 MINS   CYSTOSCOPY W/ URETERAL STENT PLACEMENT Right 10/27/2021   Procedure: CYSTOSCOPY WITH RETROGRADE PYELOGRAM/ URETERAL STENT REMOVAL;  Surgeon: Belva Agee, MD;  Location: [facility];  Service: Urology;  Laterality: Right;   CYSTOSCOPY WITH RETROGRADE PYELOGRAM, URETEROSCOPY AND STENT PLACEMENT Right 05/01/2021   Procedure: CYSTOSCOPY WITH  RIGHT  RETROGRADE PYELOGRAM,  AND RIGHT STENT PLACEMENT;  Surgeon: Jerilee Field, MD;  Location: [facility];  Service: Urology;  Laterality: Right;   EYE SURGERY     INJECTION OF SILICONE OIL Left 11/10/2020   Procedure: INJECTION OF SILICONE OIL;  Surgeon: Stephannie Li, MD;  Location: [facility] OR;  Service: Ophthalmology;  Laterality: Left;   INJECTION OF SILICONE OIL Left 12/25/2020   Procedure: INJECTION OF SILICONE OIL;  Surgeon: Stephannie Li, MD;  Location: [facility] OR;  Service: Ophthalmology;  Laterality: Left;   JOINT REPLACEMENT Right    hip   LASER PHOTO ABLATION Left 12/25/2020   Procedure: LASER PHOTO ABLATION;  Surgeon: Stephannie Li, MD;  Location: [facility] OR;  Service: Ophthalmology;  Laterality: Left;   PARS PLANA VITRECTOMY Left 11/10/2020   Procedure: PARS PLANA VITRECTOMY WITH 25 GAUGE;  Surgeon: Stephannie Li, MD;  Location: [facility] OR;  Service: Ophthalmology;  Laterality: Left;   PARS PLANA VITRECTOMY Left 12/25/2020   Procedure: PARS PLANA VITRECTOMY WITH 25 GAUGE IN LEFT EYE;  Surgeon: Stephannie Li, MD;  Location: [facility] OR;  Service: Ophthalmology;  Laterality: Left;   PERFLUORONE INJECTION Left 12/25/2020   Procedure: PERFLUORONE INJECTION;  Surgeon: Stephannie Li, MD;  Location: [facility] OR;  Service: Ophthalmology;  Laterality: Left;   PHOTOCOAGULATION WITH LASER Left 11/10/2020   Procedure: PHOTOCOAGULATION WITH LASER;  Surgeon: Stephannie Li, MD;  Location: [facility] OR;  Service: Ophthalmology;  Laterality: Left;   removal of picc line     09-2021   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   Procedure: RETINECTOMY LEFT EYE;  Surgeon: Stephannie Li, MD;  Location: [facility] OR;  Service: Ophthalmology;  Laterality: Left;   SILICON OIL REMOVAL Left 12/25/2020   Procedure: SILICONE OIL REMOVAL;  Surgeon: Stephannie Li, MD;  Location: [facility] OR;  Service: Ophthalmology;  Laterality: Left;   TEE WITHOUT CARDIOVERSION N/A 05/29/2021   Procedure: TRANSESOPHAGEAL ECHOCARDIOGRAM (TEE);   Surgeon: Christell Constant, MD;  Location: [facility] ENDOSCOPY;  Service: Cardiovascular;  Laterality: N/A;    His Family History Is Significant For: Family History  Problem Relation Age of Onset   CAD Other     His Social History Is Significant For: Social History   Socioeconomic History   Marital status: Married    Spouse name: Not on file   Number of children: Not on file   Years of education: Not on file   Highest education level: Not on file  Occupational History   Not on file  Tobacco Use   Smoking status: Never   Smokeless tobacco: Never  Vaping Use   Vaping Use: Never used  Substance and Sexual Activity   Alcohol use: Not Currently   Drug use: Never   Sexual activity: Yes  Other Topics Concern   Not on file  Social History Narrative   Not on file   Social Determinants of Health  Financial Resource Strain: Low Risk  (01/18/2023)   Overall Financial Resource Strain (CARDIA)    Difficulty of Paying Living Expenses: Not hard at all  Food Insecurity: No Food Insecurity (01/18/2023)   Hunger Vital Sign    Worried About Running Out of Food in the Last Year: Never true    Ran Out of Food in the Last Year: Never true  Transportation Needs: No Transportation Needs (01/18/2023)   PRAPARE - Administrator, Civil Service (Medical): No    Lack of Transportation (Non-Medical): No  Physical Activity: Sufficiently Active (01/18/2023)   Exercise Vital Sign    Days of Exercise per Week: 5 days    Minutes of Exercise per Session: 60 min  Stress: No Stress Concern Present (01/18/2023)   [employer] of Occupational Health - Occupational Stress Questionnaire    Feeling of Stress : Not at all  Social Connections: Socially Integrated (01/18/2023)   Social Connection and Isolation Panel [NHANES]    Frequency of Communication with Friends and Family: More than three times a week    Frequency of Social Gatherings with Friends and Family: More than three times a week     Attends Religious Services: More than 4 times per year    Active Member of [employer] or Organizations: Yes    Attends Engineer, structural: More than 4 times per year    Marital Status: Married    His Allergies Are:  No Known Allergies:   His Current Medications Are:  Outpatient Encounter Medications as of 04/19/2023  Medication Sig   acetaminophen (TYLENOL) 650 MG CR tablet Take 650 mg by mouth every 8 (eight) hours as needed for pain or fever (headache).   amiodarone (PACERONE) 200 MG tablet Take 1 tablet (200 mg total) by mouth every morning. Pt needs to make an appt.   amLODipine (NORVASC) 10 MG tablet Take 1 tablet (10 mg total) by mouth daily.   atorvastatin (LIPITOR) 80 MG tablet Take 80 mg by mouth daily.   finasteride (PROSCAR) 5 MG tablet Take 1 tablet (5 mg total) by mouth at bedtime.   insulin glargine (LANTUS) 100 unit/mL SOPN Inject 36 Units into the skin at bedtime. (Patient taking differently: Inject 34 Units into the skin at bedtime.)   Insulin Pen Needle (NOVOFINE) 30G X 8 MM MISC Inject 10 each into the skin as needed.   isosorbide mononitrate (IMDUR) 30 MG 24 hr tablet Take 30 mg by mouth every morning.   metFORMIN (GLUCOPHAGE) 1000 MG tablet Take 1 tablet (1,000 mg total) by mouth 2 (two) times daily with a meal.   metoprolol tartrate (LOPRESSOR) 50 MG tablet Take 1 tablet (50 mg total) by mouth 2 (two) times daily. Please call [phone] to schedule an appointment for future refills. Thank you.   nitroGLYCERIN (NITROSTAT) 0.3 MG SL tablet Place 0.3 mg under the tongue every 5 (five) minutes x 3 doses as needed for chest pain.   pregabalin (LYRICA) 50 MG capsule Take 1 capsule (50 mg total) by mouth daily.   silodosin (RAPAFLO) 8 MG CAPS capsule Take 8 mg by mouth daily.   [DISCONTINUED] apixaban (ELIQUIS) 5 MG TABS tablet Take 1 tablet (5 mg total) by mouth 2 (two) times daily. Pt needs to make an appt.   torsemide (DEMADEX) 20 MG tablet Take 1 tablet (20 mg  total) by mouth daily.   [DISCONTINUED] ferrous sulfate 325 (65 FE) MG tablet Take 1 tablet (325 mg total) by mouth daily with breakfast.  Please take with a source of Vitamin C (Patient not taking: Reported on 02/15/2023)   No facility-administered encounter medications on file as of 04/19/2023.  :   Review of Systems:  Out of a complete 14 point review of systems, all are reviewed and negative with the exception of these symptoms as listed below:  Review of Systems  Neurological:        NP / Internal / hx OSA on cpap - would like to establish care with sleep provider for updated study and machine / DE Peru, RAYMOND J - Last SS more than 74yrs ago currently has CPAP but old machine.  ESS 14  FSS  8. Last sleep study 2013  in [place].  Supplies from 8 months, not sure dme.    Objective:  Neurological Exam  Physical Exam Physical Examination:   Vitals:   04/19/23 1038  BP: (!) 156/74  Pulse: 65    General Examination: The patient is a very pleasant 77 y.o. male in no acute distress. He appears well-developed and well-nourished and well groomed.   HEENT: Normocephalic, atraumatic, pupils are equal, round and reactive to light, extraocular tracking is good without limitation to gaze excursion or nystagmus noted. Hearing is overall mildly impaired, left hearing aids in place.  Face is symmetric with normal facial animation, speech is clear without dysarthria, hypophonia or voice tremor.  Airway examination reveals moderate mouth dryness, tonsils absent, Mallampati class III, wider uvula noted.  Tongue protrudes centrally and palate elevates symmetrically, neck circumference 17 1/8 inches.    Chest: Clear to auscultation without wheezing, rhonchi or crackles noted.  Heart: S1+S2+0, regular and normal without murmurs, rubs or gallops noted.   Abdomen: Soft, non-tender and non-distended.  Extremities: There is no significant pitting edema in the distal lower extremities bilaterally.    Skin: Warm and dry without trophic changes noted.   Musculoskeletal: exam reveals no obvious joint deformities.   Neurologically:  Mental status: The patient is awake, alert and oriented in all 4 spheres. His immediate and remote memory, attention, language skills and fund of knowledge are appropriate. There is no evidence of aphasia, agnosia, apraxia or anomia. Speech is clear with normal prosody and enunciation. Thought process is linear. Mood is normal and affect is normal.  Cranial nerves II - XII are as described above under HEENT exam.  Motor exam: Normal bulk, strength and tone is noted. There is no obvious action or resting tremor.  Fine motor skills and coordination: grossly intact.  Cerebellar testing: No dysmetria or intention tremor. There is no truncal or gait ataxia.  Sensory exam: intact to light touch in the upper and lower extremities.  Gait, station and balance: He stands with mild difficulty, he walks with a cane.   Assessment and plan:  In summary, [name] is a very pleasant 77 y.o.-year old male with an underlying medical history of diabetes, chronic kidney disease, coronary artery disease, diffuse large B-cell lymphoma, endocarditis, hypertension, and obesity, who presents for evaluation of his obstructive sleep apnea.  He has an older AutoPap machine, and is compliant with treatment.  He is motivated to continue with PAP therapy.  Sleep [place] was over 11 [place]  Test results are not available for my review and I suggest we proceed with a home sleep test for reevaluation.  I discussed the test procedure with the patient and advised him not to use his AutoPap the night of testing at home.  I  would like to write for a new machine after his test.  His current settings are minimum pressure of 7 cm and maximum of 10.5 cm.  His residual AHI is elevated in the mild range at 14.5/h for the past month, he may very well need a slightly higher  pressure.  We discussed alternative treatment options including surgical options but mutually agreed to pursue PAP therapy after his home sleep test confirms his diagnosis.   We will plan a follow-up after testing.  I encouraged them to call with any interim questions, concerns, problems or updates or email [place] through MyChart.  Generally speaking, sleep test authorizations may take up to 2 weeks, sometimes less, sometimes longer, the patient is encouraged to get in touch with [place] if they do not hear back from the sleep lab staff directly within the next 2 weeks. This was an extended visit of over 1 hour. Thank you very much for allowing me to participate in the care of this nice patient. If I can be of any further assistance to you please do not hesitate to call me at [phone].  Sincerely,   Huston Foley, MD, PhD

## 2023-04-22 ENCOUNTER — Ambulatory Visit (INDEPENDENT_AMBULATORY_CARE_PROVIDER_SITE_OTHER): Payer: Medicare HMO | Admitting: Family Medicine

## 2023-04-22 ENCOUNTER — Encounter (HOSPITAL_BASED_OUTPATIENT_CLINIC_OR_DEPARTMENT_OTHER): Payer: Self-pay | Admitting: Family Medicine

## 2023-04-22 VITALS — BP 137/80 | HR 69 | Ht 69.0 in | Wt 244.7 lb

## 2023-04-22 DIAGNOSIS — Z794 Long term (current) use of insulin: Secondary | ICD-10-CM | POA: Diagnosis not present

## 2023-04-22 DIAGNOSIS — E1122 Type 2 diabetes mellitus with diabetic chronic kidney disease: Secondary | ICD-10-CM

## 2023-04-22 DIAGNOSIS — K5900 Constipation, unspecified: Secondary | ICD-10-CM | POA: Diagnosis not present

## 2023-04-22 DIAGNOSIS — R39198 Other difficulties with micturition: Secondary | ICD-10-CM

## 2023-04-22 DIAGNOSIS — N182 Chronic kidney disease, stage 2 (mild): Secondary | ICD-10-CM | POA: Diagnosis not present

## 2023-04-22 LAB — POCT GLYCOSYLATED HEMOGLOBIN (HGB A1C): Hemoglobin A1C: 8.5 % — AB (ref 4.0–5.6)

## 2023-04-22 MED ORDER — INSULIN PEN NEEDLE 30G X 8 MM MISC: 1.0000 | 6 refills | Status: DC | PRN

## 2023-04-22 MED ORDER — FINASTERIDE 5 MG PO TABS: 5.0000 mg | ORAL_TABLET | Freq: Every day | ORAL | 0 refills | Status: DC

## 2023-04-22 MED ORDER — INSULIN GLARGINE 100 UNITS/ML SOLOSTAR PEN: 34.0000 [IU] | PEN_INJECTOR | Freq: Every day | SUBCUTANEOUS | 2 refills | Status: DC

## 2023-04-22 NOTE — Progress Notes (Signed)
    Procedures performed today:    None.  Independent interpretation of notes and tests performed by another provider:   None.  Brief History, Exam, Impression, and Recommendations:    BP 137/80 (BP Location: Left Arm, Patient Position: Sitting, Cuff Size: Normal)   Pulse 69   Ht 5\' 9"  (1.753 m)   Wt 244 lb 11.2 oz (111 kg)   SpO2 98%   BMI 36.14 kg/m   DM2 (diabetes mellitus, type 2) (HCC) Patient continues with insulin, metformin.  At last appointment, we had discussed increasing insulin to 36 units daily given elevated fasting blood sugar readings as well as elevated hemoglobin A1c.  Patient reports that he has not been doing this primarily due to concerns of potentially running out of insulin shortly before today's appointment.  He was reporting good control of blood sugars today, reports that fasting blood sugar readings have been near 100. Foot exam completed in office today, documented in chart We discussed considerations for insulin management today.  Given reported good control of fasting blood sugars, can continue with 34 units daily.  Unfortunately, point-of-care A1c today is still above goal at 8.5%.  This seems somewhat discordant with reported fasting blood sugars.  If continuing to have discrepancy between fasting blood sugar readings and elevated hemoglobin A1c, may consider more intensive monitoring regimen, possible referral to endocrinology  Difficulty urinating Patient continues to have some difficulty with urinating, however he is requesting refill of finasteride which was prescribed urologist.  Does have some benefit with medication, however still with some difficulty. Recommend the patient continue to follow-up with urology regarding evaluation of difficulty urinating as they may consider additional evaluation or other treatment options if response to medication has been incomplete.  Constipation Patient reports ongoing issues with constipation.  Has tried various  over-the-counter medications and supplements to help with this, however continues to have issues.  Occasionally will have some abdominal discomfort with this.  He is not aware of prior colonoscopy for colon cancer screening. We discussed general considerations related to dietary adjustments, medications.  Discussed stool softener, consideration for utilizing Colace, discussed potential risks with regular laxative use.  Also reviewed possible referral to gastroenterology, patient declines today.  Return in about 3 months (around 07/23/2023) for DM, HTN.   ___________________________________________  de [place], MD, ABFM, [facility] Primary Care and Sports Medicine [facility]

## 2023-04-22 NOTE — Assessment & Plan Note (Signed)
Patient continues with insulin, metformin.  At last appointment, we had discussed increasing insulin to 36 units daily given elevated fasting blood sugar readings as well as elevated hemoglobin A1c.  Patient reports that he has not been doing this primarily due to concerns of potentially running out of insulin shortly before today's appointment.  He was reporting good control of blood sugars today, reports that fasting blood sugar readings have been near 100. Foot exam completed in office today, documented in chart We discussed considerations for insulin management today.  Given reported good control of fasting blood sugars, can continue with 34 units daily.  Unfortunately, point-of-care A1c today is still above goal at 8.5%.  This seems somewhat discordant with reported fasting blood sugars.  If continuing to have discrepancy between fasting blood sugar readings and elevated hemoglobin A1c, may consider more intensive monitoring regimen, possible referral to endocrinology

## 2023-04-22 NOTE — Patient Instructions (Signed)
  Medication Instructions:  Your physician recommends that you continue on your current medications as directed. Please refer to the Current Medication list given to you today. --If you need a refill on any your medications before your next appointment, please call your pharmacy first. If no refills are authorized on file call the office.-- Lab Work: Your physician has recommended that you have lab work today: Yes If you have labs (blood work) drawn today and your tests are completely normal, you will receive your results via MyChart message OR a phone call from our staff.  Please ensure you check your voicemail in the event that you authorized detailed messages to be left on a delegated number. If you have any lab test that is abnormal or we need to change your treatment, we will call you to review the results.  Referrals/Procedures/Imaging: No  Follow-Up: Your next appointment:   Your physician recommends that you schedule a follow-up appointment in: 3-4 months with Dr. de [place].  You will receive a text message or e-mail with a link to a survey about your care and experience with us today! We would greatly appreciate your feedback!   Thanks for letting us be apart of your health journey!!  Primary Care and Sports Medicine   Dr. Raymond de Cuba   We encourage you to activate your patient portal called "MyChart".  Sign up information is provided on this After Visit Summary.  MyChart is used to connect with patients for Virtual Visits (Telemedicine).  Patients are able to view lab/test results, encounter notes, upcoming appointments, etc.  Non-urgent messages can be sent to your provider as well. To learn more about what you can do with MyChart, please visit --  [email or web address].    

## 2023-05-02 ENCOUNTER — Telehealth (HOSPITAL_BASED_OUTPATIENT_CLINIC_OR_DEPARTMENT_OTHER): Payer: Self-pay | Admitting: Family Medicine

## 2023-05-02 NOTE — Telephone Encounter (Signed)
Patient daughter called has blood in urine... Please call back she wants to know what do do?

## 2023-05-02 NOTE — Telephone Encounter (Signed)
Waiting on Dr. De Peru to comment on labs.

## 2023-05-12 ENCOUNTER — Other Ambulatory Visit (HOSPITAL_COMMUNITY): Payer: Medicare HMO

## 2023-05-17 ENCOUNTER — Ambulatory Visit (INDEPENDENT_AMBULATORY_CARE_PROVIDER_SITE_OTHER): Payer: Medicare HMO | Admitting: Neurology

## 2023-05-17 DIAGNOSIS — G4733 Obstructive sleep apnea (adult) (pediatric): Secondary | ICD-10-CM

## 2023-05-17 DIAGNOSIS — G4719 Other hypersomnia: Secondary | ICD-10-CM

## 2023-05-17 DIAGNOSIS — E669 Obesity, unspecified: Secondary | ICD-10-CM

## 2023-05-17 DIAGNOSIS — R351 Nocturia: Secondary | ICD-10-CM

## 2023-05-18 NOTE — Procedures (Signed)
[facility]  HOME SLEEP TEST (Watch PAT) REPORT  STUDY DATE: 05/17/2023  DOB: 1946/01/18  MRN: [id number]  ORDERING CLINICIAN: Huston Foley, MD, PhD   REFERRING CLINICIAN: de [place], Raymond J, MD   CLINICAL INFORMATION/HISTORY: 77 year old male with an underlying medical history of diabetes, chronic kidney disease, coronary artery disease, diffuse large B-cell lymphoma, endocarditis, hypertension, and obesity, who presents for evaluation of his obstructive sleep apnea.  He has an older Higher education careers adviser from [employer]  He has been compliant with treatment and benefits from AutoPap therapy.  Current settings of 7 to 10.5 cm with residual AHI elevated at 14.5/h.  He should be eligible for a new machine.  Epworth sleepiness score: 14/24.  BMI: 36.9 kg/m  FINDINGS:   Sleep Summary:   Total Recording Time (hours, min): 9 hours, 15 min  Total Sleep Time (hours, min):  5 hours, 31 min  Percent REM (%):    15.6%   Respiratory Indices:   Calculated pAHI (per hour):  41.4/hour         REM pAHI:    24.7/hour       NREM pAHI: 44.6/hour  Central pAHI: 1.3/hour  Oxygen Saturation Statistics:    Oxygen Saturation (%) Mean: 94%   Minimum oxygen saturation (%):                 83%   O2 Saturation Range (%): 83 - 100%    O2 Saturation (minutes) <=88%: 4.3 min  Pulse Rate Statistics:   Pulse Mean (bpm):    64/min    Pulse Range (48 - 98/min)   IMPRESSION: OSA (obstructive sleep apnea)   RECOMMENDATION:  This home sleep test demonstrates severe obstructive sleep apnea with a total AHI of 41.4/hour and O2 nadir of 83%.  Snoring was detected, in the mild to loud range.  Ongoing treatment with positive airway pressure is highly recommended. The patient has been on AutoPap therapy and should be eligible for a new machine.  I recommend a home AutoPap setting of 7 to 12 cm via mask of choice, sized to fit, he currently uses a fullface mask with good  success. A laboratory attended titration study can be considered in the future for optimization of treatment settings and to improve tolerance and compliance. Alternative treatment options are limited secondary to the severity of the patient's sleep disordered breathing, but may include surgical treatment with an implantable hypoglossal nerve stimulator (in carefully selected candidates, meeting criteria).  Concomitant weight loss is recommended, where clinically appropriate. Please note, that untreated obstructive sleep apnea may carry additional perioperative morbidity. Patients with significant obstructive sleep apnea should receive perioperative PAP therapy and the surgeons and particularly the anesthesiologist should be informed of the diagnosis and the severity of the sleep disordered breathing. The patient should be cautioned not to drive, work at heights, or operate dangerous or heavy equipment when tired or sleepy. Review and reiteration of good sleep hygiene measures should be pursued with any patient. Other causes of the patient's symptoms, including circadian rhythm disturbances, an underlying mood disorder, medication effect and/or an underlying medical problem cannot be ruled out based on this test. Clinical correlation is recommended.  The patient and his referring provider will be notified of the test results. The patient will be seen in follow up in sleep clinic at [facility]  I certify that I have reviewed the raw data recording prior to the issuance of this report in accordance with the standards of the American Academy  of Sleep Medicine (AASM).  INTERPRETING PHYSICIAN:   Huston Foley, MD, PhD Medical Director, [facility]) Diplomat, ABPN (Neurology and Sleep)   [facility] [address] [place], [place] [address] [phone]

## 2023-05-18 NOTE — Addendum Note (Signed)
Addended by: Huston Foley on: 05/18/2023 06:18 PM   Modules accepted: Orders

## 2023-05-18 NOTE — Progress Notes (Signed)
See procedure note.

## 2023-05-25 ENCOUNTER — Telehealth: Payer: Self-pay | Admitting: *Deleted

## 2023-05-25 NOTE — Telephone Encounter (Signed)
-----   Message from Huston Foley, MD sent at 05/18/2023  6:18 PM EDT ----- Patient referred by PCP for sleep apnea reevaluation.  He has been on AutoPap therapy.  I saw him on 04/19/2023 and he had a home sleep test on 05/17/2023.  You may want to call his daughter on the [place].  She is a physician.  Patient is Russian-speaking and has allowed [place] to reach out to his daughter. Please call and notify the patient that the recent home sleep test showed obstructive sleep apnea in the severe range. I recommend ongoing treatment with an AutoPap machine.  He has an older model and I would like for him to start a new machine.  I recommend increasing his pressure settings a little bit as he still had residual apnea events on his previous settings.  He will need to follow-up within 1 to 3 months of start with his new equipment.  We can send the order to his DME company of choice.  Huston Foley, MD, PhD [facility])

## 2023-05-25 NOTE — Telephone Encounter (Signed)
Called pt's daughter on [place] and LVM with office number asking for call back.

## 2023-05-26 NOTE — Telephone Encounter (Signed)
I spoke with the pt's daughter Natasha Bence (on [place]).  She states its actually her sister who is a physician.  I spoke with Natasha Bence and discussed the sleep study results.  She understands that patient's sleep study confirmed diagnosis of severe obstructive sleep apnea. Dr Teofilo Pod recommendation is to continue treatment with AutoPap and she has placed an order for a new machine.  She also recommends increasing his pressure settings a little bit.  The patient's daughter confirmed that patient is a DME company.  We will send to [facility] since they take [facility].  We discussed the insurance compliance requirements which includes using the machine at least 4 hours at night and also he must be seen here between 30 and 90 days after set up.  We went ahead and scheduled his follow-up appointment for October 10th (she requested a Thursday) at 9:00 AM arrival 8:30 AM. Her questions were answered and she verbalized appreciation. I asked her to let us know if she hasn't received a call from [employer] within 1 week.  Referral sent to [employer].

## 2023-05-26 NOTE — Telephone Encounter (Signed)
[employer] confirmed receipt of order.  

## 2023-06-09 ENCOUNTER — Inpatient Hospital Stay (HOSPITAL_BASED_OUTPATIENT_CLINIC_OR_DEPARTMENT_OTHER): Payer: Medicare HMO | Admitting: Hematology and Oncology

## 2023-06-09 ENCOUNTER — Inpatient Hospital Stay: Payer: Medicare HMO | Attending: Hematology and Oncology

## 2023-06-09 ENCOUNTER — Other Ambulatory Visit: Payer: Self-pay | Admitting: Hematology and Oncology

## 2023-06-09 VITALS — BP 138/73 | HR 64 | Temp 97.7°F | Resp 13 | Wt 255.4 lb

## 2023-06-09 DIAGNOSIS — C8338 Diffuse large B-cell lymphoma, lymph nodes of multiple sites: Secondary | ICD-10-CM | POA: Diagnosis not present

## 2023-06-09 DIAGNOSIS — I4891 Unspecified atrial fibrillation: Secondary | ICD-10-CM | POA: Insufficient documentation

## 2023-06-09 DIAGNOSIS — R739 Hyperglycemia, unspecified: Secondary | ICD-10-CM | POA: Diagnosis not present

## 2023-06-09 DIAGNOSIS — R161 Splenomegaly, not elsewhere classified: Secondary | ICD-10-CM | POA: Insufficient documentation

## 2023-06-09 DIAGNOSIS — Z8249 Family history of ischemic heart disease and other diseases of the circulatory system: Secondary | ICD-10-CM | POA: Insufficient documentation

## 2023-06-09 DIAGNOSIS — Z791 Long term (current) use of non-steroidal anti-inflammatories (NSAID): Secondary | ICD-10-CM | POA: Insufficient documentation

## 2023-06-09 DIAGNOSIS — R600 Localized edema: Secondary | ICD-10-CM | POA: Insufficient documentation

## 2023-06-09 DIAGNOSIS — D509 Iron deficiency anemia, unspecified: Secondary | ICD-10-CM | POA: Diagnosis not present

## 2023-06-09 DIAGNOSIS — Z79899 Other long term (current) drug therapy: Secondary | ICD-10-CM | POA: Insufficient documentation

## 2023-06-09 DIAGNOSIS — Z7901 Long term (current) use of anticoagulants: Secondary | ICD-10-CM | POA: Diagnosis not present

## 2023-06-09 DIAGNOSIS — Z87440 Personal history of urinary (tract) infections: Secondary | ICD-10-CM | POA: Diagnosis not present

## 2023-06-09 LAB — CMP (CANCER CENTER ONLY)
ALT: 43 U/L (ref 0–44)
AST: 37 U/L (ref 15–41)
Albumin: 4 g/dL (ref 3.5–5.0)
Alkaline Phosphatase: 80 U/L (ref 38–126)
Anion gap: 11 (ref 5–15)
BUN: 35 mg/dL — ABNORMAL HIGH (ref 8–23)
CO2: 26 mmol/L (ref 22–32)
Calcium: 9.4 mg/dL (ref 8.9–10.3)
Chloride: 105 mmol/L (ref 98–111)
Creatinine: 1.79 mg/dL — ABNORMAL HIGH (ref 0.61–1.24)
GFR, Estimated: 39 mL/min — ABNORMAL LOW (ref >60)
Glucose, Bld: 210 mg/dL — ABNORMAL HIGH (ref 70–99)
Potassium: 4.1 mmol/L (ref 3.5–5.1)
Sodium: 142 mmol/L (ref 135–145)
Total Bilirubin: 0.5 mg/dL (ref 0.3–1.2)
Total Protein: 5.7 g/dL — ABNORMAL LOW (ref 6.5–8.1)

## 2023-06-09 LAB — CBC WITH DIFFERENTIAL (CANCER CENTER ONLY)
Abs Immature Granulocytes: 0.02 10*3/uL (ref 0.00–0.07)
Basophils Absolute: 0 10*3/uL (ref 0.0–0.1)
Basophils Relative: 1 %
Eosinophils Absolute: 0.2 10*3/uL (ref 0.0–0.5)
Eosinophils Relative: 3 %
HCT: 35.4 % — ABNORMAL LOW (ref 39.0–52.0)
Hemoglobin: 11.7 g/dL — ABNORMAL LOW (ref 13.0–17.0)
Immature Granulocytes: 0 %
Lymphocytes Relative: 30 %
Lymphs Abs: 2.3 10*3/uL (ref 0.7–4.0)
MCH: 25.2 pg — ABNORMAL LOW (ref 26.0–34.0)
MCHC: 33.1 g/dL (ref 30.0–36.0)
MCV: 76.3 fL — ABNORMAL LOW (ref 80.0–100.0)
Monocytes Absolute: 0.7 10*3/uL (ref 0.1–1.0)
Monocytes Relative: 9 %
Neutro Abs: 4.5 10*3/uL (ref 1.7–7.7)
Neutrophils Relative %: 57 %
Platelet Count: 201 10*3/uL (ref 150–400)
RBC: 4.64 MIL/uL (ref 4.22–5.81)
RDW: 16.3 % — ABNORMAL HIGH (ref 11.5–15.5)
WBC Count: 7.9 10*3/uL (ref 4.0–10.5)
nRBC: 0 % (ref 0.0–0.2)

## 2023-06-09 LAB — LACTATE DEHYDROGENASE: LDH: 177 U/L (ref 98–192)

## 2023-06-09 NOTE — Progress Notes (Signed)
[facility] [phone]   [phone]  PROGRESS NOTE  Patient Care Team: de Peru, Buren Kos, MD as PCP - General (Family Medicine) Pricilla Riffle, MD as PCP - Cardiology (Cardiology) Verl Bangs, MD (Family Medicine)  Hematological/Oncological History # Diffuse Large B Cell Lymphoma, Stage III/IV 04/30/2021: presented with RLQ abdominal pain. CT abdomen/pelvis shows pathologic mesenteric, retroperitoneal, and pelvic adenopathy, as described above. Moderate splenomegaly. Lobulated retroperitoneal soft tissue masses in the region of the right ureteropelvic junction likely resulting in mild right hydronephrosis secondary to extrinsic mass effect as well as within the right retroperitoneum 05/04/2021: needle core biopsy of right retroperitoneal lymph node shows large B cell lymphoma. 05/13/2021: establish care with Dr. Leonides Schanz 05/23/2021-06/01/2021: admitted with urosepsis. Concern for endocarditis, started on 6 week course abx via PICC line.   06/09/2021-06/11/2021: Cycle 1 of R-CEOP 07/01/2021-07/03/2021: Cycle 2 of R-CEOP 07/22/2021-07/24/2021: Cycle 3 of R-CEOP 08/12/2021-08/14/2021: Cycle 4 of R-CEOP 09/02/2021-09/04/2021: Cycle 5 of R-CEOP 09/07/2021: PET CT scan showed marked interval improvement with respect to nodal disease in the neck, chest, abdomen and pelvis, near complete resolution of visible, lymph nodes. Deauville criteria 2/3. 09/23/2021-09/25/2021: Cycle 6 of R-CEOP 11/04/2021: NM PET CT scan showed no suspicious lymphadenopathy in the neck, chest, abdomen, or pelvis. Deauville criteria 1. Consistent with a complete metabolic response.   Interval History:  [name] 77 y.o. male with medical history significant for stage III/IV diffuse large B-cell lymphoma presents for a follow up visit. The patient's last visit was on 11/16/2022. In the interim since the last visit he has had no major changes in his health.  On exam today, [name] is  accompanied by his daughter.  He reports his legs are swollen and he has been taking increased doses of torsemide.  He believes his legs are swollen because he stopped torsemide for 1 week while he was visiting with his family.  He reports his energy is "in the middle".  His weight is up to 255 pounds, up from 244 pounds.  He notes that he is taking 2 pills of torsemide which is why he thinks his creatinine is elevated today.  He notes his whole body was swollen but there has been improvement with the increased dosage recently.  He notes that he would like to push off undergoing the CT scan as he has difficulty with the contrast.  He notes he would like to delay it past August when he is undergoing a procedure on his prostate in order to help his urinary flow.  He notes that he does have some occasional pain in his tailbone and hip.  He denies any fevers, chills, sweats, shortness of breath, chest pain or cough. He has no other complaints. 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 per dr Zenaida Niece dam note 06-18-2021   Hematuria 06/18/2021   Hypertension    Sleep apnea    uses cpap   Uses walker 07/24/2021   prn or cane   UTI (urinary tract infection)    finished antibiotics  on 07-08-2021   Wound of skin 10/20/2021   sees wound care center for multiple dressing changes    SURGICAL HISTORY: Past Surgical History:  Procedure Laterality Date   CARDIAC CATHETERIZATION     CYSTOSCOPY W/ URETERAL STENT PLACEMENT Right 07/28/2021   Procedure: CYSTOSCOPY WITH RETROGRADE PYELOGRAM/URETERAL STENT EXCHANGE;  Surgeon: Belva Agee, MD;   Location: [facility];  Service: Urology;  Laterality: Right;  30 MINS   CYSTOSCOPY W/ URETERAL STENT PLACEMENT Right 10/27/2021   Procedure: CYSTOSCOPY WITH RETROGRADE PYELOGRAM/ URETERAL STENT REMOVAL;  Surgeon: Belva Agee, MD;  Location: [facility];  Service: Urology;  Laterality: Right;   CYSTOSCOPY WITH RETROGRADE PYELOGRAM, URETEROSCOPY AND STENT PLACEMENT Right 05/01/2021   Procedure: CYSTOSCOPY WITH RIGHT  RETROGRADE PYELOGRAM,  AND RIGHT STENT PLACEMENT;  Surgeon: Jerilee Field, MD;  Location: [facility];  Service: Urology;  Laterality: Right;   EYE SURGERY     INJECTION OF SILICONE OIL Left 11/10/2020   Procedure: INJECTION OF SILICONE OIL;  Surgeon: Stephannie Li, MD;  Location: [facility] OR;  Service: Ophthalmology;  Laterality: Left;   INJECTION OF SILICONE OIL Left 12/25/2020   Procedure: INJECTION OF SILICONE OIL;  Surgeon: Stephannie Li, MD;  Location: [facility] OR;  Service: Ophthalmology;  Laterality: Left;   JOINT REPLACEMENT Right    hip   LASER PHOTO ABLATION Left 12/25/2020   Procedure: LASER PHOTO ABLATION;  Surgeon: Stephannie Li, MD;  Location: [facility] OR;  Service: Ophthalmology;  Laterality: Left;   PARS PLANA VITRECTOMY Left 11/10/2020   Procedure: PARS PLANA VITRECTOMY WITH 25 GAUGE;  Surgeon: Stephannie Li, MD;  Location: [facility] OR;  Service: Ophthalmology;  Laterality: Left;   PARS PLANA VITRECTOMY Left 12/25/2020   Procedure: PARS PLANA VITRECTOMY WITH 25 GAUGE IN LEFT EYE;  Surgeon: Stephannie Li, MD;  Location: [facility] OR;  Service: Ophthalmology;  Laterality: Left;   PERFLUORONE INJECTION Left 12/25/2020   Procedure: PERFLUORONE INJECTION;  Surgeon: Stephannie Li, MD;  Location: [facility] OR;  Service: Ophthalmology;  Laterality: Left;   PHOTOCOAGULATION WITH LASER Left 11/10/2020   Procedure: PHOTOCOAGULATION WITH LASER;  Surgeon: Stephannie Li, MD;  Location: [facility] OR;  Service: Ophthalmology;  Laterality: Left;   removal of picc line     09-2021    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   Procedure: RETINECTOMY LEFT EYE;  Surgeon: Stephannie Li, MD;  Location: [facility] OR;  Service: Ophthalmology;  Laterality: Left;   SILICON OIL REMOVAL Left 12/25/2020   Procedure: SILICONE OIL REMOVAL;  Surgeon: Stephannie Li, MD;  Location: [facility] OR;  Service: Ophthalmology;  Laterality: Left;   TEE WITHOUT CARDIOVERSION N/A 05/29/2021   Procedure: TRANSESOPHAGEAL ECHOCARDIOGRAM (TEE);  Surgeon: Christell Constant, MD;  Location: [facility] ENDOSCOPY;  Service: Cardiovascular;  Laterality: N/A;    SOCIAL HISTORY: Social History   Socioeconomic History   Marital status: Married    Spouse name: Not on file   Number of children: Not on file   Years of education: Not on file   Highest education level: Not on file  Occupational History   Not on file  Tobacco Use   Smoking status: Never   Smokeless tobacco: Never  Vaping Use   Vaping status: Never Used  Substance and Sexual Activity   Alcohol use: Not Currently   Drug use: Never   Sexual activity: Yes  Other Topics Concern   Not on file  Social History Narrative   Not on file   Social Determinants of Health  Financial Resource Strain: Low Risk  (01/18/2023)   Overall Financial Resource Strain (CARDIA)    Difficulty of Paying Living Expenses: Not hard at all  Food Insecurity: No Food Insecurity (01/18/2023)   Hunger Vital Sign    Worried About Running Out of Food in the Last Year: Never true    Ran Out of Food in the Last Year: Never true  Transportation Needs: No Transportation Needs (01/18/2023)   PRAPARE - Administrator, Civil Service (Medical): No    Lack of Transportation (Non-Medical): No  Physical Activity: Sufficiently Active (01/18/2023)   Exercise Vital Sign    Days of Exercise per Week: 5 days    Minutes of Exercise per Session: 60 min  Stress: No Stress Concern Present (01/18/2023)   [employer] of Occupational Health - Occupational Stress  Questionnaire    Feeling of Stress : Not at all  Social Connections: Socially Integrated (01/18/2023)   Social Connection and Isolation Panel [NHANES]    Frequency of Communication with Friends and Family: More than three times a week    Frequency of Social Gatherings with Friends and Family: More than three times a week    Attends Religious Services: More than 4 times per year    Active Member of [employer] or Organizations: Yes    Attends Engineer, structural: More than 4 times per year    Marital Status: Married  Catering manager Violence: Not At Risk (01/18/2023)   Humiliation, Afraid, Rape, and Kick questionnaire    Fear of Current or Ex-Partner: No    Emotionally Abused: No    Physically Abused: No    Sexually Abused: No    FAMILY HISTORY: Family History  Problem Relation Age of Onset   CAD Other     ALLERGIES:  has No Known Allergies.  MEDICATIONS:  Current Outpatient Medications  Medication Sig Dispense Refill   acetaminophen (TYLENOL) 650 MG CR tablet Take 650 mg by mouth every 8 (eight) hours as needed for pain or fever (headache).     amiodarone (PACERONE) 200 MG tablet Take 1 tablet (200 mg total) by mouth every morning. Pt needs to make an appt. 90 tablet 0   amLODipine (NORVASC) 10 MG tablet Take 1 tablet (10 mg total) by mouth daily. 90 tablet 3   apixaban (ELIQUIS) 5 MG TABS tablet Take 1 tablet (5 mg total) by mouth 2 (two) times daily. Please call the office to schedule an appointment with Cardiologist. 180 tablet 1   atorvastatin (LIPITOR) 80 MG tablet Take 80 mg by mouth daily.     finasteride (PROSCAR) 5 MG tablet Take 1 tablet (5 mg total) by mouth at bedtime. 90 tablet 0   insulin glargine (LANTUS) 100 unit/mL SOPN Inject 34 Units into the skin at bedtime. 15 mL 2   Insulin Pen Needle (NOVOFINE) 30G X 8 MM MISC Inject 10 each into the skin as needed. 100 each 6   isosorbide mononitrate (IMDUR) 30 MG 24 hr tablet Take 30 mg by mouth every morning.      metFORMIN (GLUCOPHAGE) 1000 MG tablet Take 1 tablet (1,000 mg total) by mouth 2 (two) times daily with a meal. 180 tablet 1   metoprolol tartrate (LOPRESSOR) 50 MG tablet Take 1 tablet (50 mg total) by mouth 2 (two) times daily. Please call [phone] to schedule an appointment for future refills. Thank you. 180 tablet 0   nitroGLYCERIN (NITROSTAT) 0.3 MG SL tablet Place 0.3 mg under the tongue every 5 (  five) minutes x 3 doses as needed for chest pain.     pregabalin (LYRICA) 50 MG capsule Take 1 capsule (50 mg total) by mouth daily. 90 capsule 1   silodosin (RAPAFLO) 8 MG CAPS capsule Take 8 mg by mouth daily.     torsemide (DEMADEX) 20 MG tablet Take 1 tablet (20 mg total) by mouth daily. 90 tablet 1   No current facility-administered medications for this visit.    REVIEW OF SYSTEMS:   Constitutional: ( - ) fevers, ( - )  chills , ( - ) night sweats Eyes: ( - ) blurriness of vision, ( - ) double vision, ( - ) watery eyes Ears, nose, mouth, throat, and face: ( - ) mucositis, ( - ) sore throat Respiratory: ( - ) cough, ( - ) dyspnea, ( - ) wheezes Cardiovascular: ( - ) palpitation, ( - ) chest discomfort, ( +) lower extremity swelling Gastrointestinal:  ( - ) nausea, ( - ) heartburn, ( - ) change in bowel habits Skin: ( - ) abnormal skin rashes Lymphatics: ( - ) new lymphadenopathy, ( - ) easy bruising Neurological: ( - ) numbness, ( - ) tingling, ( - ) new weaknesses Behavioral/Psych: ( - ) mood change, ( - ) new changes  All other systems were reviewed with the patient and are negative.  PHYSICAL EXAMINATION: ECOG PERFORMANCE STATUS: 1 - Symptomatic but completely ambulatory  Vitals:   06/09/23 1450  BP: 138/73  Pulse: 64  Resp: 13  Temp: 97.7 F (36.5 C)  SpO2: 97%       Filed Weights   06/09/23 1450  Weight: 255 lb 6.4 oz (115.8 kg)      GENERAL: Well-appearing elderly [place] male, alert, no distress and comfortable SKIN: skin color, texture, turgor are normal,  no rashes or significant lesions EYES: conjunctiva are pink and non-injected, sclera clear NECK: supple, non-tender LYMPH:  no palpable lymphadenopathy in the cervical or  clavicular region. LUNGS: clear to auscultation and percussion with normal breathing effort HEART: regular rate & rhythm and no murmurs. 3+ pitting edema bilaterally in the lower extremities below the knee. PSYCH: alert & oriented x 3, fluent speech NEURO: no focal motor/sensory deficits  LABORATORY DATA:  I have reviewed the data as listed    Latest Ref Rng & Units 06/09/2023    2:21 PM 02/15/2023   10:21 AM 11/16/2022   10:48 AM  CBC  WBC 4.0 - 10.5 K/uL 7.9  8.5  6.3   Hemoglobin 13.0 - 17.0 g/dL 16.1  09.6  04.5   Hematocrit 39.0 - 52.0 % 35.4  34.5  31.7   Platelets 150 - 400 K/uL 201  246  221        Latest Ref Rng & Units 06/09/2023    2:21 PM 02/15/2023   10:21 AM 11/16/2022   10:48 AM  CMP  Glucose 70 - 99 mg/dL 409  811  914   BUN 8 - 23 mg/dL 35  24  11   Creatinine 0.61 - 1.24 mg/dL 7.82  9.56  2.13   Sodium 135 - 145 mmol/L 142  142  136   Potassium 3.5 - 5.1 mmol/L 4.1  4.2  3.9   Chloride 98 - 111 mmol/L 105  109  101   CO2 22 - 32 mmol/L 26  24  26    Calcium 8.9 - 10.3 mg/dL 9.4  9.1  8.4   Total Protein 6.5 - 8.1 g/dL 5.7  5.8  5.3   Total Bilirubin 0.3 - 1.2 mg/dL 0.5  0.5  0.6   Alkaline Phos 38 - 126 U/L 80  100  80   AST 15 - 41 U/L 37  18  26   ALT 0 - 44 U/L 43  18  21      RADIOGRAPHIC STUDIES: I reviewed the images below. No evidence of fluid in elbow or evidence of fracture   Home sleep test  Result Date: 05/17/2023 Huston Foley, MD     05/18/2023  6:15 PM  [facility] HOME SLEEP TEST (Watch PAT) REPORT STUDY DATE: 05/17/2023 DOB: Jun 15, 1946 MRN: [id number] ORDERING CLINICIAN: Huston Foley, MD, PhD  REFERRING CLINICIAN: de [place], Raymond J, MD CLINICAL INFORMATION/HISTORY: 77 year old male with an underlying medical history of diabetes, chronic kidney disease, coronary  artery disease, diffuse large B-cell lymphoma, endocarditis, hypertension, and obesity, who presents for evaluation of his obstructive sleep apnea.  He has an older Higher education careers adviser from [employer]  He has been compliant with treatment and benefits from AutoPap therapy.  Current settings of 7 to 10.5 cm with residual AHI elevated at 14.5/h.  He should be eligible for a new machine. Epworth sleepiness score: 14/24. BMI: 36.9 kg/m FINDINGS: Sleep Summary: Total Recording Time (hours, min): 9 hours, 15 min Total Sleep Time (hours, min):  5 hours, 31 min Percent REM (%):    15.6% Respiratory Indices: Calculated pAHI (per hour):  41.4/hour       REM pAHI:    24.7/hour     NREM pAHI: 44.6/hour Central pAHI: 1.3/hour Oxygen Saturation Statistics:  Oxygen Saturation (%) Mean: 94% Minimum oxygen saturation (%):                 83% O2 Saturation Range (%): 83 - 100%  O2 Saturation (minutes) <=88%: 4.3 min Pulse Rate Statistics: Pulse Mean (bpm):    64/min  Pulse Range (48 - 98/min) IMPRESSION: OSA (obstructive sleep apnea) RECOMMENDATION: This home sleep test demonstrates severe obstructive sleep apnea with a total AHI of 41.4/hour and O2 nadir of 83%.  Snoring was detected, in the mild to loud range.  Ongoing treatment with positive airway pressure is highly recommended. The patient has been on AutoPap therapy and should be eligible for a new machine.  I recommend a home AutoPap setting of 7 to 12 cm via mask of choice, sized to fit, he currently uses a fullface mask with good success. A laboratory attended titration study can be considered in the future for optimization of treatment settings and to improve tolerance and compliance. Alternative treatment options are limited secondary to the severity of the patient's sleep disordered breathing, but may include surgical treatment with an implantable hypoglossal nerve stimulator (in carefully selected candidates, meeting criteria).  Concomitant weight loss is  recommended, where clinically appropriate. Please note, that untreated obstructive sleep apnea may carry additional perioperative morbidity. Patients with significant obstructive sleep apnea should receive perioperative PAP therapy and the surgeons and particularly the anesthesiologist should be informed of the diagnosis and the severity of the sleep disordered breathing. The patient should be cautioned not to drive, work at heights, or operate dangerous or heavy equipment when tired or sleepy. Review and reiteration of good sleep hygiene measures should be pursued with any patient. Other causes of the patient's symptoms, including circadian rhythm disturbances, an underlying mood disorder, medication effect and/or an underlying medical problem cannot be ruled out based on this test. Clinical correlation is recommended. The patient and his referring  provider will be notified of the test results. The patient will be seen in follow up in sleep clinic at [facility]. I certify that I have reviewed the raw data recording prior to the issuance of this report in accordance with the standards of the American Academy of Sleep Medicine (AASM). INTERPRETING PHYSICIAN: Huston Foley, MD, PhD Medical Director, [facility]) Diplomat, ABPN (Neurology and Sleep) [facility] [address] [place], [place] [address] [phone]    ASSESSMENT & PLAN [name] 77 y.o. male with medical history significant for stage III/IV diffuse large B-cell lymphoma presents for a follow up visit.    After review of the labs, review of the records, and discussion with the patient the patients findings are most consistent with advanced stage diffuse large B-cell lymphoma.  PET scan confirmed widespread disease in the neck, chest, abdomen and pelvis. Additionally, there is evidence of splenomegaly and a 5.4 cm soft tissue lesion along the right lower kidney. Patient does have a  cardiac history significant for atrial fibrillation on amiodarone and therefore we may need to consider dropping anthracyclines from his regimen. The treatment consists of R-CEOP based regimen moving forward.  Posttreatment PET CT scan on 11/04/2021 showed complete metabolic response to therapy.  DLBCL IPI Score: 2 points, good prognosis. 80% progression free survival    # Diffuse Large B Cell Lymphoma, ABC subtype. Stage III/IV -- Findings at this time are most consistent with stage III/IV diffuse large B-cell lymphoma. --Treatment consisted R-CEOP chemotherapy given his cardiac issues and mildly reduced EF(he is on amiodarone for atrial fibrillation) --Received 6 cycles of R-CEOP from 06/09/2021-09/26/2021 --Post treatment PET scan from 11/04/2021 showed no suspicious lymphadenopathy in the neck, chest, abdomen, or pelvis. Deauville criteria 1. Consistent with a complete metabolic response. Plan: --labs from today were reviewed and showed white blood cell 7.9, hemoglobin 11.7, MCV 76.3, and platelets of 201 --continue on surveillance as there is no clinical evidence of recurrence. --last CT scan in Dec 2023. Repeat due in June 2024 but patient has asked to delay until late August due to upcoming prostate surgery. --RTC in 3 months with labs and CT CAP in June   #Microcytic anemia --Hgb 11.1 --Iron panel checked previously that shows deficiency with serum iron 16, saturation 4%, TIBC 374.  Rechecked today.  Iron saturation 9% and ferritin of 73. --Continue ferrous sulfate 325 mg once a day.  Patient notes he will restart this treatment today  #Hematuria, resolved --Likely secondary to anticoagulation (on Eliquis and Plavix), soft tissue lesion along the medial right lower kidney s/p ureteral stent placement.  --patient holding Plavix, continuing eliquis.   #Lower extremity edema -- Patient has bilateral swelling of the legs with pealing of skin and erythema. -- Patient is no long taking IV  lasix since recent hospitalization. Currently on torsemide 20 mg PO daily (though he has been taking it twice daily after skipping it for 1 week) -- Patient denies any shortness of breath or chest discomfort.  -- Patient will follow up with cardiology for any dose changes of diuretics.   #Hyperglycemia--improved --historically poorly controlled.  --glucose is 210 today --encouraged patient to stay compliant with DM medications and follow up with PCP.   No orders of the defined types were placed in this encounter.  All questions were answered. The patient knows to call the clinic with any problems, questions or concerns.  I have spent a total of 30 minutes minutes of  face-to-face and non-face-to-face time, preparing to see the patient, performing a medically appropriate examination, counseling and educating the patient, ordering medications/tests,communicating with other health care professionals, documenting clinical information in the electronic health record, and care coordination.   Ulysees Barns, MD Department of Hematology/Oncology [facility] at [facility] Phone: [phone] Pager: [phone] Email: [email or web address]    06/12/2023 6:09 PM

## 2023-06-12 ENCOUNTER — Encounter: Payer: Self-pay | Admitting: Hematology and Oncology

## 2023-06-13 NOTE — Assessment & Plan Note (Signed)
Patient continues to have some difficulty with urinating, however he is requesting refill of finasteride which was prescribed urologist.  Does have some benefit with medication, however still with some difficulty. Recommend the patient continue to follow-up with urology regarding evaluation of difficulty urinating as they may consider additional evaluation or other treatment options if response to medication has been incomplete.

## 2023-06-17 NOTE — Assessment & Plan Note (Signed)
Patient reports ongoing issues with constipation.  Has tried various over-the-counter medications and supplements to help with this, however continues to have issues.  Occasionally will have some abdominal discomfort with this.  He is not aware of prior colonoscopy for colon cancer screening. We discussed general considerations related to dietary adjustments, medications.  Discussed stool softener, consideration for utilizing Colace, discussed potential risks with regular laxative use.  Also reviewed possible referral to gastroenterology, patient declines today.

## 2023-06-27 DIAGNOSIS — R3914 Feeling of incomplete bladder emptying: Secondary | ICD-10-CM | POA: Diagnosis not present

## 2023-06-27 DIAGNOSIS — N401 Enlarged prostate with lower urinary tract symptoms: Secondary | ICD-10-CM | POA: Diagnosis not present

## 2023-07-07 ENCOUNTER — Other Ambulatory Visit: Payer: Self-pay | Admitting: Internal Medicine

## 2023-07-11 ENCOUNTER — Telehealth (HOSPITAL_BASED_OUTPATIENT_CLINIC_OR_DEPARTMENT_OTHER): Payer: Self-pay | Admitting: Family Medicine

## 2023-07-11 NOTE — Telephone Encounter (Signed)
Pt  daughter is calling to ask some questions regarding her dad

## 2023-07-12 ENCOUNTER — Other Ambulatory Visit (HOSPITAL_BASED_OUTPATIENT_CLINIC_OR_DEPARTMENT_OTHER): Payer: Self-pay

## 2023-07-12 MED ORDER — METOPROLOL TARTRATE 50 MG PO TABS: 50.0000 mg | ORAL_TABLET | Freq: Two times a day (BID) | ORAL | 0 refills | Status: DC

## 2023-07-12 MED ORDER — FINASTERIDE 5 MG PO TABS: 5.0000 mg | ORAL_TABLET | Freq: Every day | ORAL | 0 refills | Status: DC

## 2023-07-12 NOTE — Telephone Encounter (Signed)
Patient requesting refills. Sent in the refills he requested, only additional medication was LYRICA. Let his daughter know I will route this to PCP to approve lyrica

## 2023-07-13 MED ORDER — PREGABALIN 50 MG PO CAPS: 50.0000 mg | ORAL_CAPSULE | Freq: Every day | ORAL | 0 refills | Status: DC

## 2023-07-26 ENCOUNTER — Ambulatory Visit (INDEPENDENT_AMBULATORY_CARE_PROVIDER_SITE_OTHER): Payer: Medicare HMO | Admitting: Family Medicine

## 2023-07-26 ENCOUNTER — Encounter (HOSPITAL_BASED_OUTPATIENT_CLINIC_OR_DEPARTMENT_OTHER): Payer: Self-pay | Admitting: Family Medicine

## 2023-07-26 VITALS — BP 128/64 | HR 68 | Temp 97.9°F | Ht 70.87 in | Wt 265.2 lb

## 2023-07-26 DIAGNOSIS — N182 Chronic kidney disease, stage 2 (mild): Secondary | ICD-10-CM | POA: Diagnosis not present

## 2023-07-26 DIAGNOSIS — Z794 Long term (current) use of insulin: Secondary | ICD-10-CM | POA: Diagnosis not present

## 2023-07-26 DIAGNOSIS — E1122 Type 2 diabetes mellitus with diabetic chronic kidney disease: Secondary | ICD-10-CM | POA: Diagnosis not present

## 2023-07-26 DIAGNOSIS — R39198 Other difficulties with micturition: Secondary | ICD-10-CM

## 2023-07-26 LAB — POCT GLYCOSYLATED HEMOGLOBIN (HGB A1C)
HbA1c POC (<> result, manual entry): 8.5 % (ref 4.0–5.6)
Hemoglobin A1C: 8.5 % — AB (ref 4.0–5.6)

## 2023-07-26 NOTE — Assessment & Plan Note (Signed)
Patient continues with insulin, metformin.  Patient continues with insulin at 34 units daily.  He was reporting good control of blood sugars today, reports that fasting blood sugar readings have been in the low 100s. Patient is due for check of hemoglobin A1c at this time.  Has been above goal recently. If continuing to have discrepancy between fasting blood sugar readings and elevated hemoglobin A1c, may consider more intensive monitoring regimen, possible referral to endocrinology Patient had questions about resuming glipizide.  Again had long discussion about avoiding glipizide with use of insulin.  Discussed notable risk associated with this.  Patient continues state preference for resuming glipizide given that he had used it for numerous years in the past and reportedly had better control of blood sugars while utilizing medication.  Discussed that glipizide can be effective with helping to control blood sugars, however over time it can become less effective.  It can also be associated with high risk of hypoglycemic events when used in conjunction with insulin.  Offered referral to endocrinology for further discussion regarding management of diabetes.  For now, we can continue with current medication regimen.  Patient declines referral at this time

## 2023-07-26 NOTE — Progress Notes (Signed)
    Procedures performed today:    None.  Independent interpretation of notes and tests performed by another provider:   None.  Brief History, Exam, Impression, and Recommendations:    BP 128/64 (BP Location: Left Arm, Patient Position: Sitting, Cuff Size: Normal)   Pulse 68   Temp 97.9 F (36.6 C)   Ht 5' 10.87" (1.8 m)   Wt 265 lb 3.2 oz (120.3 kg)   SpO2 97%   BMI 37.13 kg/m   Type 2 diabetes mellitus with stage 2 chronic kidney disease, with long-term current use of insulin (HCC) Assessment & Plan: Patient continues with insulin, metformin.  Patient continues with insulin at 34 units daily.  He was reporting good control of blood sugars today, reports that fasting blood sugar readings have been in the low 100s. Patient is due for check of hemoglobin A1c at this time.  Has been above goal recently. If continuing to have discrepancy between fasting blood sugar readings and elevated hemoglobin A1c, may consider more intensive monitoring regimen, possible referral to endocrinology Patient had questions about resuming glipizide.  Again had long discussion about avoiding glipizide with use of insulin.  Discussed notable risk associated with this.  Patient continues state preference for resuming glipizide given that he had used it for numerous years in the past and reportedly had better control of blood sugars while utilizing medication.  Discussed that glipizide can be effective with helping to control blood sugars, however over time it can become less effective.  It can also be associated with high risk of hypoglycemic events when used in conjunction with insulin.  Offered referral to endocrinology for further discussion regarding management of diabetes.  For now, we can continue with current medication regimen.  Patient declines referral at this time   Difficulty urinating Assessment & Plan: Has had procedure with urology, has follow-up appointment in the near future.  Currently doing  well following procedure.  We will continue with monitoring   Return in about 3 months (around 10/25/2023) for diabetes.  Hemoglobin A1c testing with continued elevation above goal.  Does remain stable at 8.5%.  Given this elevation, will proceed with referral to endocrinology.  Spent 35 minutes on this patient encounter, including preparation, chart review, face-to-face counseling with patient and coordination of care, and documentation of encounter   ___________________________________________ Stanley Krapf de Peru, MD, ABFM, [facility] [place]

## 2023-07-26 NOTE — Assessment & Plan Note (Signed)
Has had procedure with urology, has follow-up appointment in the near future.  Currently doing well following procedure.  We will continue with monitoring

## 2023-07-28 ENCOUNTER — Telehealth: Payer: Self-pay

## 2023-07-28 NOTE — Telephone Encounter (Signed)
As long as he tolerates treatment, I would recommend continuation of current settings as he has more central than obstructive apneas, his central apneas may improve over time.  I would like to keep his settings the same.  We may consider bringing him in for a titration study after his appointment in October.  His AHI was elevated on AutoPap therapy when I first met him.

## 2023-07-28 NOTE — Telephone Encounter (Signed)
Noted.  We have not heard from patient regarding any complaints. FYI for Amy

## 2023-08-04 DIAGNOSIS — R8279 Other abnormal findings on microbiological examination of urine: Secondary | ICD-10-CM | POA: Diagnosis not present

## 2023-08-04 DIAGNOSIS — N401 Enlarged prostate with lower urinary tract symptoms: Secondary | ICD-10-CM | POA: Diagnosis not present

## 2023-08-04 DIAGNOSIS — R3914 Feeling of incomplete bladder emptying: Secondary | ICD-10-CM | POA: Diagnosis not present

## 2023-08-23 NOTE — Progress Notes (Unsigned)
PATIENT: [name] DOB: [birth year]  REASON FOR VISIT: follow up HISTORY FROM: patient  No chief complaint on file.    HISTORY OF PRESENT ILLNESS:  08/23/23 ALL:  [name] is a 77 y.o. male here [name] for follow up for OSA on CPAP.  He was seen in consult with Dr Frances Furbish 04/2023 in need of a new CPAP machine. HST 05/2023 showed severe obstructive sleep apnea with a total AHI of 41.4/hour and O2 nadir of 83%. AutoPAP ordered. Since,     HISTORY: (copied from Dr Teofilo Pod previous note)  Dear Dr. de Peru,   I saw your patient, [name], upon your kind request in my sleep clinic [name] for initial consultation of his sleep disorder, in particular, evaluation of his prior diagnosis of obstructive sleep apnea.  The patient is accompanied by his wife and a [place] interpreter, [name].  The patient himself speaks [place] and [place] but also some [place]. As you know, [name] is a 77 year old male with an underlying medical history of diabetes, chronic kidney disease, coronary artery disease, diffuse large B-cell lymphoma, endocarditis, hypertension, and obesity, who was previously diagnosed with obstructive sleep apnea and placed on PAP therapy.  Prior sleep study results are not available for my review [name].  I was able to review his PAP compliance data from the past 30 days.  His Epworth sleepiness score is 14 out of 24, fatigue severity score is 9 out of 63.    He has an older Scientist, forensic.  He has been compliant with treatment, between 03/20/2023 and 04/18/2023 he used his machine 29 out of 30 days with percent use days greater than 4 hours at 96.7%, indicating excellent compliance with an average usage for days on treatment of 6 hours and 45 minutes, residual AHI elevated in the mild to maybe borderline moderate range at 14.5/h, 90th percentile of pressure at 10.5 cm.  He is AutoPap is set for a minimum pressure of 7 cm and maximum at 10.5 cm.   He does not have a high leak from his mask, he indicates that he uses a fullface mask.  He has been compliant with treatment, sleep study testing was years ago, probably close to 11 years ago.  He was in [place] state.  He moved to [place] area with his wife, they have a daughter who lives here.  They have a total of 8 children, 33 grandchildren and 8 great-grandchildren.  Bedtime is generally between 10 and 11 and rise time between 6:30 AM and 7 AM.  He does have nocturia about 2-3 times per average night.  He drinks caffeine in the form of tea, 1 cup in the morning, no alcohol, and he is a non-smoker.  He denies recurrent morning headaches or nocturnal headaches.  He had a tonsillectomy as a child.  He would be willing to continue with PAP therapy but would like a new machine.    REVIEW OF SYSTEMS: Out of a complete 14 system review of symptoms, the patient complains only of the following symptoms, and all other reviewed systems are negative.  ESS:  ALLERGIES: No Known Allergies  HOME MEDICATIONS: Outpatient Medications Prior to Visit  Medication Sig Dispense Refill   acetaminophen (TYLENOL) 650 MG CR tablet Take 650 mg by mouth every 8 (eight) hours as needed for pain or fever (headache).     amiodarone (PACERONE) 200 MG tablet Take 1 tablet (200 mg total) by mouth every morning.  Pt needs to make an appt. 90 tablet 0   amLODipine (NORVASC) 10 MG tablet Take 1 tablet (10 mg total) by mouth daily. 90 tablet 3   apixaban (ELIQUIS) 5 MG TABS tablet Take 1 tablet (5 mg total) by mouth 2 (two) times daily. Please call the office to schedule an appointment with Cardiologist. 180 tablet 1   atorvastatin (LIPITOR) 80 MG tablet Take 80 mg by mouth daily. (Patient not taking: Reported on 07/26/2023)     finasteride (PROSCAR) 5 MG tablet Take 1 tablet (5 mg total) by mouth at bedtime. 90 tablet 0   insulin glargine (LANTUS) 100 unit/mL SOPN Inject 34 Units into the skin at bedtime. 15 mL 2   Insulin  Pen Needle (NOVOFINE) 30G X 8 MM MISC Inject 10 each into the skin as needed. 100 each 6   isosorbide mononitrate (IMDUR) 30 MG 24 hr tablet Take 30 mg by mouth every morning.     metFORMIN (GLUCOPHAGE) 1000 MG tablet Take 1 tablet (1,000 mg total) by mouth 2 (two) times daily with a meal. 180 tablet 1   metoprolol tartrate (LOPRESSOR) 50 MG tablet Take 1 tablet (50 mg total) by mouth 2 (two) times daily. Please call [phone] to schedule an appointment for future refills. Thank you. 180 tablet 0   nitroGLYCERIN (NITROSTAT) 0.3 MG SL tablet Place 0.3 mg under the tongue every 5 (five) minutes x 3 doses as needed for chest pain.     pregabalin (LYRICA) 50 MG capsule Take 1 capsule (50 mg total) by mouth daily. 90 capsule 0   silodosin (RAPAFLO) 8 MG CAPS capsule Take 8 mg by mouth daily. (Patient not taking: Reported on 07/26/2023)     torsemide (DEMADEX) 20 MG tablet Take 1 tablet (20 mg total) by mouth daily. 90 tablet 1   No facility-administered medications prior to visit.    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 per dr Zenaida Niece dam note 06-18-2021   Hematuria 06/18/2021   Hypertension    Sleep apnea    uses cpap   Uses walker 07/24/2021   prn or cane   UTI (urinary tract infection)    finished antibiotics on 07-08-2021   Wound of skin 10/20/2021   sees wound care center for multiple dressing changes    PAST SURGICAL HISTORY: Past Surgical History:  Procedure Laterality Date   CARDIAC CATHETERIZATION     CYSTOSCOPY W/ URETERAL STENT PLACEMENT Right 07/28/2021   Procedure: CYSTOSCOPY WITH RETROGRADE PYELOGRAM/URETERAL STENT EXCHANGE;  Surgeon:  Belva Agee, MD;  Location: [facility];  Service: Urology;  Laterality: Right;  30 MINS   CYSTOSCOPY W/ URETERAL STENT PLACEMENT Right 10/27/2021   Procedure: CYSTOSCOPY WITH RETROGRADE PYELOGRAM/ URETERAL STENT REMOVAL;  Surgeon: Belva Agee, MD;  Location: [facility];  Service: Urology;  Laterality: Right;   CYSTOSCOPY WITH RETROGRADE PYELOGRAM, URETEROSCOPY AND STENT PLACEMENT Right 05/01/2021   Procedure: CYSTOSCOPY WITH RIGHT  RETROGRADE PYELOGRAM,  AND RIGHT STENT PLACEMENT;  Surgeon: Jerilee Field, MD;  Location: [facility];  Service: Urology;  Laterality: Right;  EYE SURGERY     INJECTION OF SILICONE OIL Left 11/10/2020   Procedure: INJECTION OF SILICONE OIL;  Surgeon: Stephannie Li, MD;  Location: [facility] OR;  Service: Ophthalmology;  Laterality: Left;   INJECTION OF SILICONE OIL Left 12/25/2020   Procedure: INJECTION OF SILICONE OIL;  Surgeon: Stephannie Li, MD;  Location: [facility] OR;  Service: Ophthalmology;  Laterality: Left;   JOINT REPLACEMENT Right    hip   LASER PHOTO ABLATION Left 12/25/2020   Procedure: LASER PHOTO ABLATION;  Surgeon: Stephannie Li, MD;  Location: [facility] OR;  Service: Ophthalmology;  Laterality: Left;   PARS PLANA VITRECTOMY Left 11/10/2020   Procedure: PARS PLANA VITRECTOMY WITH 25 GAUGE;  Surgeon: Stephannie Li, MD;  Location: [facility] OR;  Service: Ophthalmology;  Laterality: Left;   PARS PLANA VITRECTOMY Left 12/25/2020   Procedure: PARS PLANA VITRECTOMY WITH 25 GAUGE IN LEFT EYE;  Surgeon: Stephannie Li, MD;  Location: [facility] OR;  Service: Ophthalmology;  Laterality: Left;   PERFLUORONE INJECTION Left 12/25/2020   Procedure: PERFLUORONE INJECTION;  Surgeon: Stephannie Li, MD;  Location: [facility] OR;  Service: Ophthalmology;  Laterality: Left;   PHOTOCOAGULATION WITH LASER Left 11/10/2020   Procedure: PHOTOCOAGULATION WITH LASER;  Surgeon: Stephannie Li, MD;  Location: [facility] OR;  Service: Ophthalmology;  Laterality: Left;   removal of  picc line     09-2021   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   Procedure: RETINECTOMY LEFT EYE;  Surgeon: Stephannie Li, MD;  Location: [facility] OR;  Service: Ophthalmology;  Laterality: Left;   SILICON OIL REMOVAL Left 12/25/2020   Procedure: SILICONE OIL REMOVAL;  Surgeon: Stephannie Li, MD;  Location: [facility] OR;  Service: Ophthalmology;  Laterality: Left;   TEE WITHOUT CARDIOVERSION N/A 05/29/2021   Procedure: TRANSESOPHAGEAL ECHOCARDIOGRAM (TEE);  Surgeon: Christell Constant, MD;  Location: [facility] ENDOSCOPY;  Service: Cardiovascular;  Laterality: N/A;    FAMILY HISTORY: Family History  Problem Relation Age of Onset   CAD Other     SOCIAL HISTORY: Social History   Socioeconomic History   Marital status: Married    Spouse name: Not on file   Number of children: Not on file   Years of education: Not on file   Highest education level: Not on file  Occupational History   Not on file  Tobacco Use   Smoking status: Never   Smokeless tobacco: Never  Vaping Use   Vaping status: Never Used  Substance and Sexual Activity   Alcohol use: Not Currently   Drug use: Never   Sexual activity: Yes  Other Topics Concern   Not on file  Social History Narrative   Not on file   Social Determinants of Health   Financial Resource Strain: Low Risk  (07/26/2023)   Overall Financial Resource Strain (CARDIA)    Difficulty of Paying Living Expenses: Not hard at all  Food Insecurity: No Food Insecurity (07/26/2023)   Hunger Vital Sign    Worried About Running Out of Food in the Last Year: Never true    Ran Out of Food in the Last Year: Never true  Transportation Needs: No Transportation Needs (07/26/2023)   PRAPARE - Administrator, Civil Service (Medical): No    Lack of Transportation (Non-Medical): No  Physical Activity: Sufficiently Active (07/26/2023)   Exercise Vital Sign    Days of Exercise per Week: 5 days    Minutes of Exercise per Session: 30 min   Stress: No Stress Concern Present (07/26/2023)   [employer]  of Occupational Health - Occupational Stress Questionnaire    Feeling of Stress : Not at all  Social Connections: Moderately Isolated (07/26/2023)   Social Connection and Isolation Panel [NHANES]    Frequency of Communication with Friends and Family: More than three times a week    Frequency of Social Gatherings with Friends and Family: Twice a week    Attends Religious Services: Never    Database administrator or Organizations: No    Attends Banker Meetings: Never    Marital Status: Married  Catering manager Violence: Not At Risk (07/26/2023)   Humiliation, Afraid, Rape, and Kick questionnaire    Fear of Current or Ex-Partner: No    Emotionally Abused: No    Physically Abused: No    Sexually Abused: No     PHYSICAL EXAM  There were no vitals filed for this visit. There is no height or weight on file to calculate BMI.  Generalized: Well developed, in no acute distress  Cardiology: normal rate and rhythm, no murmur noted Respiratory: clear to auscultation bilaterally  Neurological examination  Mentation: Alert oriented to time, place, history taking. Follows all commands speech and language fluent Cranial nerve II-XII: Pupils were equal round reactive to light. Extraocular movements were full, visual field were full  Motor: The motor testing reveals 5 over 5 strength of all 4 extremities. Good symmetric motor tone is noted throughout.  Gait and station: Gait is normal.    DIAGNOSTIC DATA (LABS, IMAGING, TESTING) - I reviewed patient records, labs, notes, testing and imaging myself where available.      No data to display           Lab Results  Component Value Date   WBC 7.9 06/09/2023   HGB 11.7 (L) 06/09/2023   HCT 35.4 (L) 06/09/2023   MCV 76.3 (L) 06/09/2023   PLT 201 06/09/2023      Component Value Date/Time   NA 142 06/09/2023 1421   NA 139 08/04/2022 1040   K 4.1 06/09/2023  1421   CL 105 06/09/2023 1421   CO2 26 06/09/2023 1421   GLUCOSE 210 (H) 06/09/2023 1421   BUN 35 (H) 06/09/2023 1421   BUN 22 08/04/2022 1040   CREATININE 1.79 (H) 06/09/2023 1421   CALCIUM 9.4 06/09/2023 1421   PROT 5.7 (L) 06/09/2023 1421   ALBUMIN 4.0 06/09/2023 1421   AST 37 06/09/2023 1421   ALT 43 06/09/2023 1421   ALKPHOS 80 06/09/2023 1421   BILITOT 0.5 06/09/2023 1421   GFRNONAA 39 (L) 06/09/2023 1421   No results found for: "CHOL", "HDL", "LDLCALC", "LDLDIRECT", "TRIG", "CHOLHDL" Lab Results  Component Value Date   HGBA1C 8.5 (A) 07/26/2023   HGBA1C 8.5 07/26/2023   Lab Results  Component Value Date   VITAMINB12 312 05/24/2021   Lab Results  Component Value Date   TSH 1.854 01/18/2022     ASSESSMENT AND PLAN 77 y.o. year old male  has a past medical history of Anginal pain (HCC), Coronary artery disease, diffuse large b cell lymphoma, DM type 2, Dysrhythmia, Easy bruising (06/18/2021), Endocarditis, Hematuria (06/18/2021), Hypertension, Sleep apnea, Uses walker (07/24/2021), UTI (urinary tract infection), and Wound of skin (10/20/2021). here with   No diagnosis found.    [name] is doing well on CPAP therapy. Compliance report reveals ***. *** was encouraged to continue using CPAP nightly and for greater than 4 hours each night. We will update supply orders as indicated. Risks of untreated sleep apnea  review and education materials provided. Healthy lifestyle habits encouraged. *** will follow up in ***, sooner if needed. *** verbalizes understanding and agreement with this plan.    No orders of the defined types were placed in this encounter.    No orders of the defined types were placed in this encounter.     Shawnie Dapper, FNP-C 08/23/2023, 11:38 AM [facility] [address], [address] [place], [place] [address] [phone]

## 2023-08-23 NOTE — Patient Instructions (Incomplete)
Please continue using your CPAP regularly. While your insurance requires that you use CPAP at least 4 hours each night on 70% of the nights, I recommend, that you not skip any nights and use it throughout the night if you can. Getting used to CPAP and staying with the treatment long term does take time and patience and discipline. Untreated obstructive sleep apnea when it is moderate to severe can have an adverse impact on cardiovascular health and raise her risk for heart disease, arrhythmias, hypertension, congestive heart failure, stroke and diabetes. Untreated obstructive sleep apnea causes sleep disruption, nonrestorative sleep, and sleep deprivation. This can have an impact on your day to day functioning and cause daytime sleepiness and impairment of cognitive function, memory loss, mood disturbance, and problems focussing. Using CPAP regularly can improve these symptoms.  We will update supply orders, today. We will have the DME company to increase the maximum pressure from 12 to 14. I will reprint a download to assess response in about 8 weeks. Please try to sleep on your side when you can. Sleeping on your back can increase apneic events. Continue to adjust humidity and temperature settings for comfort.   Follow up in 1 year

## 2023-08-25 ENCOUNTER — Ambulatory Visit: Payer: Medicare HMO | Admitting: Family Medicine

## 2023-08-25 ENCOUNTER — Encounter: Payer: Self-pay | Admitting: Family Medicine

## 2023-08-25 VITALS — BP 138/69 | HR 65 | Ht 69.0 in | Wt 267.5 lb

## 2023-08-25 DIAGNOSIS — G4733 Obstructive sleep apnea (adult) (pediatric): Secondary | ICD-10-CM | POA: Diagnosis not present

## 2023-09-07 ENCOUNTER — Other Ambulatory Visit: Payer: Self-pay | Admitting: Internal Medicine

## 2023-09-09 ENCOUNTER — Other Ambulatory Visit: Payer: Self-pay

## 2023-09-09 ENCOUNTER — Inpatient Hospital Stay: Payer: Medicare HMO | Attending: Hematology and Oncology

## 2023-09-09 ENCOUNTER — Inpatient Hospital Stay (HOSPITAL_BASED_OUTPATIENT_CLINIC_OR_DEPARTMENT_OTHER): Payer: Medicare HMO | Admitting: Hematology and Oncology

## 2023-09-09 ENCOUNTER — Other Ambulatory Visit: Payer: Self-pay | Admitting: Hematology and Oncology

## 2023-09-09 VITALS — BP 150/62 | HR 64 | Temp 97.6°F | Resp 13 | Wt 270.1 lb

## 2023-09-09 DIAGNOSIS — Z7901 Long term (current) use of anticoagulants: Secondary | ICD-10-CM | POA: Diagnosis not present

## 2023-09-09 DIAGNOSIS — C8338 Diffuse large B-cell lymphoma, lymph nodes of multiple sites: Secondary | ICD-10-CM | POA: Insufficient documentation

## 2023-09-09 DIAGNOSIS — Z87440 Personal history of urinary (tract) infections: Secondary | ICD-10-CM | POA: Diagnosis not present

## 2023-09-09 DIAGNOSIS — D509 Iron deficiency anemia, unspecified: Secondary | ICD-10-CM | POA: Insufficient documentation

## 2023-09-09 DIAGNOSIS — Z8249 Family history of ischemic heart disease and other diseases of the circulatory system: Secondary | ICD-10-CM | POA: Diagnosis not present

## 2023-09-09 DIAGNOSIS — R600 Localized edema: Secondary | ICD-10-CM | POA: Insufficient documentation

## 2023-09-09 DIAGNOSIS — I4891 Unspecified atrial fibrillation: Secondary | ICD-10-CM | POA: Diagnosis not present

## 2023-09-09 DIAGNOSIS — Z79899 Other long term (current) drug therapy: Secondary | ICD-10-CM | POA: Insufficient documentation

## 2023-09-09 DIAGNOSIS — R739 Hyperglycemia, unspecified: Secondary | ICD-10-CM | POA: Diagnosis not present

## 2023-09-09 LAB — CBC WITH DIFFERENTIAL (CANCER CENTER ONLY)
Abs Immature Granulocytes: 0.01 10*3/uL (ref 0.00–0.07)
Basophils Absolute: 0 10*3/uL (ref 0.0–0.1)
Basophils Relative: 1 %
Eosinophils Absolute: 0.2 10*3/uL (ref 0.0–0.5)
Eosinophils Relative: 4 %
HCT: 36.8 % — ABNORMAL LOW (ref 39.0–52.0)
Hemoglobin: 11.6 g/dL — ABNORMAL LOW (ref 13.0–17.0)
Immature Granulocytes: 0 %
Lymphocytes Relative: 30 %
Lymphs Abs: 1.9 10*3/uL (ref 0.7–4.0)
MCH: 25.6 pg — ABNORMAL LOW (ref 26.0–34.0)
MCHC: 31.5 g/dL (ref 30.0–36.0)
MCV: 81.1 fL (ref 80.0–100.0)
Monocytes Absolute: 0.7 10*3/uL (ref 0.1–1.0)
Monocytes Relative: 11 %
Neutro Abs: 3.3 10*3/uL (ref 1.7–7.7)
Neutrophils Relative %: 54 %
Platelet Count: 163 10*3/uL (ref 150–400)
RBC: 4.54 MIL/uL (ref 4.22–5.81)
RDW: 14.9 % (ref 11.5–15.5)
WBC Count: 6.1 10*3/uL (ref 4.0–10.5)
nRBC: 0 % (ref 0.0–0.2)

## 2023-09-09 LAB — CMP (CANCER CENTER ONLY)
ALT: 50 U/L — ABNORMAL HIGH (ref 0–44)
AST: 40 U/L (ref 15–41)
Albumin: 4 g/dL (ref 3.5–5.0)
Alkaline Phosphatase: 76 U/L (ref 38–126)
Anion gap: 8 (ref 5–15)
BUN: 29 mg/dL — ABNORMAL HIGH (ref 8–23)
CO2: 28 mmol/L (ref 22–32)
Calcium: 9.4 mg/dL (ref 8.9–10.3)
Chloride: 104 mmol/L (ref 98–111)
Creatinine: 1.77 mg/dL — ABNORMAL HIGH (ref 0.61–1.24)
GFR, Estimated: 39 mL/min — ABNORMAL LOW (ref >60)
Glucose, Bld: 249 mg/dL — ABNORMAL HIGH (ref 70–99)
Potassium: 4.8 mmol/L (ref 3.5–5.1)
Sodium: 140 mmol/L (ref 135–145)
Total Bilirubin: 0.6 mg/dL (ref 0.3–1.2)
Total Protein: 6 g/dL — ABNORMAL LOW (ref 6.5–8.1)

## 2023-09-09 LAB — RETIC PANEL
Immature Retic Fract: 14 % (ref 2.3–15.9)
RBC.: 4.49 MIL/uL (ref 4.22–5.81)
Retic Count, Absolute: 75.9 10*3/uL (ref 19.0–186.0)
Retic Ct Pct: 1.7 % (ref 0.4–3.1)
Reticulocyte Hemoglobin: 27.3 pg — ABNORMAL LOW (ref >27.9)

## 2023-09-09 LAB — FERRITIN: Ferritin: 17 ng/mL — ABNORMAL LOW (ref 24–336)

## 2023-09-09 LAB — IRON AND IRON BINDING CAPACITY (CC-WL,HP ONLY)
Iron: 60 ug/dL (ref 45–182)
Saturation Ratios: 14 % — ABNORMAL LOW (ref 17.9–39.5)
TIBC: 427 ug/dL (ref 250–450)
UIBC: 367 ug/dL (ref 117–376)

## 2023-09-09 LAB — LACTATE DEHYDROGENASE: LDH: 176 U/L (ref 98–192)

## 2023-09-09 MED ORDER — FERROUS SULFATE 325 (65 FE) MG PO TABS: 325.0000 mg | ORAL_TABLET | Freq: Every day | ORAL | 3 refills | Status: AC

## 2023-09-09 NOTE — Progress Notes (Unsigned)
[facility] Telephone:([phone]   [phone]) [phone]  PROGRESS NOTE  Patient Care Team: de [place], Buren Kos, MD as PCP - General (Family Medicine) Pricilla Riffle, MD as PCP - Cardiology (Cardiology) Verl Bangs, MD (Family Medicine)  Hematological/Oncological History # Diffuse Large B Cell Lymphoma, Stage III/IV 04/30/2021: presented with RLQ abdominal pain. CT abdomen/pelvis shows pathologic mesenteric, retroperitoneal, and pelvic adenopathy, as described above. Moderate splenomegaly. Lobulated retroperitoneal soft tissue masses in the region of the right ureteropelvic junction likely resulting in mild right hydronephrosis secondary to extrinsic mass effect as well as within the right retroperitoneum 05/04/2021: needle core biopsy of right retroperitoneal lymph node shows large B cell lymphoma. 05/13/2021: establish care with Dr. Leonides Schanz 05/23/2021-06/01/2021: admitted with urosepsis. Concern for endocarditis, started on 6 week course abx via PICC line.   06/09/2021-06/11/2021: Cycle 1 of R-CEOP 07/01/2021-07/03/2021: Cycle 2 of R-CEOP 07/22/2021-07/24/2021: Cycle 3 of R-CEOP 08/12/2021-08/14/2021: Cycle 4 of R-CEOP 09/02/2021-09/04/2021: Cycle 5 of R-CEOP 09/07/2021: PET CT scan showed marked interval improvement with respect to nodal disease in the neck, chest, abdomen and pelvis, near complete resolution of visible, lymph nodes. Deauville criteria 2/3. 09/23/2021-09/25/2021: Cycle 6 of R-CEOP 11/04/2021: NM PET CT scan showed no suspicious lymphadenopathy in the neck, chest, abdomen, or pelvis. Deauville criteria 1. Consistent with a complete metabolic response.   Interval History:  [name] 77 y.o. male with medical history significant for stage III/IV diffuse large B-cell lymphoma presents for a follow up visit. The patient's last visit was on 09/09/2023. In the interim since the last visit he has had no major changes in his health.  On exam today, Mr. Zucconi is  accompanied by his daughter.  He reports his energy has been very good in the interim since his last visit.  He reports he is having no new symptoms.  He underwent his prostate surgery and tolerated it well.  He completed 2 weeks of antibiotics and is had no urinary issues since.  He reports his appetite is good and his weight is increasing.  He has not noticed any lymphadenopathy.  He is not having any runny nose, sore throat, or cough.  He reports that his legs are "no good".  They are still quite weak.  He notes that he is getting stronger with every passing day.  Otherwise he has no questions concerns or complaints today.Marland Kitchen  He denies any fevers, chills, sweats, shortness of breath, chest pain or cough. He has no other complaints. 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 per dr Zenaida Niece dam note 06-18-2021   Hematuria 06/18/2021   Hypertension    Sleep apnea    uses cpap   Uses walker 07/24/2021   prn or cane   UTI (urinary tract infection)    finished antibiotics on 07-08-2021   Wound of skin 10/20/2021   sees wound care center for multiple dressing changes    SURGICAL HISTORY: Past Surgical History:  Procedure Laterality Date   CARDIAC CATHETERIZATION     CYSTOSCOPY W/ URETERAL STENT PLACEMENT  Right 07/28/2021   Procedure: CYSTOSCOPY WITH RETROGRADE PYELOGRAM/URETERAL STENT EXCHANGE;  Surgeon: Belva Agee, MD;  Location: [facility];  Service: Urology;  Laterality: Right;  30 MINS   CYSTOSCOPY W/ URETERAL STENT PLACEMENT Right 10/27/2021   Procedure: CYSTOSCOPY WITH RETROGRADE PYELOGRAM/ URETERAL STENT  REMOVAL;  Surgeon: Belva Agee, MD;  Location: [facility];  Service: Urology;  Laterality: Right;   CYSTOSCOPY WITH RETROGRADE PYELOGRAM, URETEROSCOPY AND STENT PLACEMENT Right 05/01/2021   Procedure: CYSTOSCOPY WITH RIGHT  RETROGRADE PYELOGRAM,  AND RIGHT STENT PLACEMENT;  Surgeon: Jerilee Field, MD;  Location: [facility];  Service: Urology;  Laterality: Right;   EYE SURGERY     INJECTION OF SILICONE OIL Left 11/10/2020   Procedure: INJECTION OF SILICONE OIL;  Surgeon: Stephannie Li, MD;  Location: [facility] OR;  Service: Ophthalmology;  Laterality: Left;   INJECTION OF SILICONE OIL Left 12/25/2020   Procedure: INJECTION OF SILICONE OIL;  Surgeon: Stephannie Li, MD;  Location: [facility] OR;  Service: Ophthalmology;  Laterality: Left;   JOINT REPLACEMENT Right    hip   LASER PHOTO ABLATION Left 12/25/2020   Procedure: LASER PHOTO ABLATION;  Surgeon: Stephannie Li, MD;  Location: [facility] OR;  Service: Ophthalmology;  Laterality: Left;   PARS PLANA VITRECTOMY Left 11/10/2020   Procedure: PARS PLANA VITRECTOMY WITH 25 GAUGE;  Surgeon: Stephannie Li, MD;  Location: [facility] OR;  Service: Ophthalmology;  Laterality: Left;   PARS PLANA VITRECTOMY Left 12/25/2020   Procedure: PARS PLANA VITRECTOMY WITH 25 GAUGE IN LEFT EYE;  Surgeon: Stephannie Li, MD;  Location: [facility] OR;  Service: Ophthalmology;  Laterality: Left;   PERFLUORONE INJECTION Left 12/25/2020   Procedure: PERFLUORONE INJECTION;  Surgeon: Stephannie Li, MD;  Location: [facility] OR;  Service: Ophthalmology;  Laterality: Left;   PHOTOCOAGULATION WITH LASER Left 11/10/2020   Procedure: PHOTOCOAGULATION WITH LASER;  Surgeon: Stephannie Li, MD;  Location: [facility] OR;  Service: Ophthalmology;  Laterality: Left;   removal of picc line     09-2021   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 OR;  Service: Ophthalmology;  Laterality: Left;   SILICON OIL REMOVAL  Left 12/25/2020   Procedure: SILICONE OIL REMOVAL;  Surgeon: Stephannie Li, MD;  Location: [facility] OR;  Service: Ophthalmology;  Laterality: Left;   TEE WITHOUT CARDIOVERSION N/A 05/29/2021   Procedure: TRANSESOPHAGEAL ECHOCARDIOGRAM (TEE);  Surgeon: Christell Constant, MD;  Location: [facility] ENDOSCOPY;  Service: Cardiovascular;  Laterality: N/A;    SOCIAL HISTORY: Social History   Socioeconomic History   Marital status: Married    Spouse name: Not on file   Number of children: Not on file   Years of education: Not on file   Highest education level: Not on file  Occupational History   Not on file  Tobacco Use   Smoking status: Never   Smokeless tobacco: Never  Vaping Use   Vaping status: Never Used  Substance and Sexual Activity   Alcohol use: Not Currently   Drug use: Never   Sexual activity: Yes  Other Topics Concern   Not on file  Social History Narrative   Not on file   Social Determinants of Health   Financial Resource Strain: Low Risk  (07/26/2023)   Overall Financial Resource Strain (CARDIA)    Difficulty of Paying Living Expenses: Not hard at all  Food Insecurity: No Food Insecurity (07/26/2023)   Hunger Vital Sign    Worried  About Running Out of Food in the Last Year: Never true    Ran Out of Food in the Last Year: Never true  Transportation Needs: No Transportation Needs (07/26/2023)   PRAPARE - Administrator, Civil Service (Medical): No    Lack of Transportation (Non-Medical): No  Physical Activity: Sufficiently Active (07/26/2023)   Exercise Vital Sign    Days of Exercise per Week: 5 days    Minutes of Exercise per Session: 30 min  Stress: No Stress Concern Present (07/26/2023)   [employer] of Occupational Health - Occupational Stress Questionnaire    Feeling of Stress : Not at all  Social Connections: Moderately Isolated (07/26/2023)   Social Connection and Isolation Panel [NHANES]    Frequency of Communication with Friends and Family: More  than three times a week    Frequency of Social Gatherings with Friends and Family: Twice a week    Attends Religious Services: Never    Database administrator or Organizations: No    Attends Banker Meetings: Never    Marital Status: Married  Catering manager Violence: Not At Risk (07/26/2023)   Humiliation, Afraid, Rape, and Kick questionnaire    Fear of Current or Ex-Partner: No    Emotionally Abused: No    Physically Abused: No    Sexually Abused: No    FAMILY HISTORY: Family History  Problem Relation Age of Onset   CAD Other     ALLERGIES:  has No Known Allergies.  MEDICATIONS:  Current Outpatient Medications  Medication Sig Dispense Refill   ferrous sulfate 325 (65 FE) MG tablet Take 1 tablet (325 mg total) by mouth daily with breakfast. Please take with a source of Vitamin C 90 tablet 3   acetaminophen (TYLENOL) 650 MG CR tablet Take 650 mg by mouth every 8 (eight) hours as needed for pain or fever (headache).     amiodarone (PACERONE) 200 MG tablet Take 1 tablet (200 mg total) by mouth daily. 15 tablet 0   amLODipine (NORVASC) 10 MG tablet Take 1 tablet (10 mg total) by mouth daily. 90 tablet 3   apixaban (ELIQUIS) 5 MG TABS tablet Take 1 tablet (5 mg total) by mouth 2 (two) times daily. Please call the office to schedule an appointment with Cardiologist. 180 tablet 1   atorvastatin (LIPITOR) 80 MG tablet Take 80 mg by mouth daily.     finasteride (PROSCAR) 5 MG tablet Take 1 tablet (5 mg total) by mouth at bedtime. 90 tablet 0   insulin glargine (LANTUS) 100 unit/mL SOPN Inject 34 Units into the skin at bedtime. 15 mL 2   Insulin Pen Needle (NOVOFINE) 30G X 8 MM MISC Inject 10 each into the skin as needed. 100 each 6   isosorbide mononitrate (IMDUR) 30 MG 24 hr tablet Take 30 mg by mouth every morning.     metFORMIN (GLUCOPHAGE) 1000 MG tablet Take 1 tablet (1,000 mg total) by mouth 2 (two) times daily with a meal. 180 tablet 1   metoprolol tartrate (LOPRESSOR)  50 MG tablet Take 1 tablet (50 mg total) by mouth 2 (two) times daily. Please call [phone] to schedule an appointment for future refills. Thank you. 180 tablet 0   nitroGLYCERIN (NITROSTAT) 0.3 MG SL tablet Place 0.3 mg under the tongue every 5 (five) minutes x 3 doses as needed for chest pain.     pregabalin (LYRICA) 50 MG capsule Take 1 capsule (50 mg total) by mouth daily. 90 capsule 0  silodosin (RAPAFLO) 8 MG CAPS capsule Take 8 mg by mouth daily.     torsemide (DEMADEX) 20 MG tablet Take 2 tablets by mouth once daily 30 tablet 0   No current facility-administered medications for this visit.    REVIEW OF SYSTEMS:   Constitutional: ( - ) fevers, ( - )  chills , ( - ) night sweats Eyes: ( - ) blurriness of vision, ( - ) double vision, ( - ) watery eyes Ears, nose, mouth, throat, and face: ( - ) mucositis, ( - ) sore throat Respiratory: ( - ) cough, ( - ) dyspnea, ( - ) wheezes Cardiovascular: ( - ) palpitation, ( - ) chest discomfort, ( +) lower extremity swelling Gastrointestinal:  ( - ) nausea, ( - ) heartburn, ( - ) change in bowel habits Skin: ( - ) abnormal skin rashes Lymphatics: ( - ) new lymphadenopathy, ( - ) easy bruising Neurological: ( - ) numbness, ( - ) tingling, ( - ) new weaknesses Behavioral/Psych: ( - ) mood change, ( - ) new changes  All other systems were reviewed with the patient and are negative.  PHYSICAL EXAMINATION: ECOG PERFORMANCE STATUS: 1 - Symptomatic but completely ambulatory  Vitals:   09/09/23 1147  BP: (!) 150/62  Pulse: 64  Resp: 13  Temp: 97.6 F (36.4 C)  SpO2: 96%   Filed Weights   09/09/23 1147  Weight: 270 lb 1.6 oz (122.5 kg)    GENERAL: Well-appearing elderly [place] male, alert, no distress and comfortable SKIN: skin color, texture, turgor are normal, no rashes or significant lesions EYES: conjunctiva are pink and non-injected, sclera clear NECK: supple, non-tender LYMPH:  no palpable lymphadenopathy in the cervical or   clavicular region. LUNGS: clear to auscultation and percussion with normal breathing effort HEART: regular rate & rhythm and no murmurs. 3+ pitting edema bilaterally in the lower extremities below the knee. PSYCH: alert & oriented x 3, fluent speech NEURO: no focal motor/sensory deficits  LABORATORY DATA:  I have reviewed the data as listed    Latest Ref Rng & Units 09/09/2023   10:54 AM 06/09/2023    2:21 PM 02/15/2023   10:21 AM  CBC  WBC 4.0 - 10.5 K/uL 6.1  7.9  8.5   Hemoglobin 13.0 - 17.0 g/dL 40.9  81.1  91.4   Hematocrit 39.0 - 52.0 % 36.8  35.4  34.5   Platelets 150 - 400 K/uL 163  201  246        Latest Ref Rng & Units 09/09/2023   10:54 AM 06/09/2023    2:21 PM 02/15/2023   10:21 AM  CMP  Glucose 70 - 99 mg/dL 782  956  213   BUN 8 - 23 mg/dL 29  35  24   Creatinine 0.61 - 1.24 mg/dL 0.86  5.78  4.69   Sodium 135 - 145 mmol/L 140  142  142   Potassium 3.5 - 5.1 mmol/L 4.8  4.1  4.2   Chloride 98 - 111 mmol/L 104  105  109   CO2 22 - 32 mmol/L 28  26  24    Calcium 8.9 - 10.3 mg/dL 9.4  9.4  9.1   Total Protein 6.5 - 8.1 g/dL 6.0  5.7  5.8   Total Bilirubin 0.3 - 1.2 mg/dL 0.6  0.5  0.5   Alkaline Phos 38 - 126 U/L 76  80  100   AST 15 - 41 U/L 40  37  18  ALT 0 - 44 U/L 50  43  18      RADIOGRAPHIC STUDIES: I reviewed the images below. No evidence of fluid in elbow or evidence of fracture   No results found.  ASSESSMENT & PLAN DAKOTAH COHEA 77 y.o. male with medical history significant for stage III/IV diffuse large B-cell lymphoma presents for a follow up visit.    After review of the labs, review of the records, and discussion with the patient the patients findings are most consistent with advanced stage diffuse large B-cell lymphoma.  PET scan confirmed widespread disease in the neck, chest, abdomen and pelvis. Additionally, there is evidence of splenomegaly and a 5.4 cm soft tissue lesion along the right lower kidney. Patient does have a cardiac history  significant for atrial fibrillation on amiodarone and therefore we may need to consider dropping anthracyclines from his regimen. The treatment consists of R-CEOP based regimen moving forward.  Posttreatment PET CT scan on 11/04/2021 showed complete metabolic response to therapy.  DLBCL IPI Score: 2 points, good prognosis. 80% progression free survival    # Diffuse Large B Cell Lymphoma, ABC subtype. Stage III/IV -- Findings at this time are most consistent with stage III/IV diffuse large B-cell lymphoma. --Treatment consisted R-CEOP chemotherapy given his cardiac issues and mildly reduced EF(he is on amiodarone for atrial fibrillation) --Received 6 cycles of R-CEOP from 06/09/2021-09/26/2021 --Post treatment PET scan from 11/04/2021 showed no suspicious lymphadenopathy in the neck, chest, abdomen, or pelvis. Deauville criteria 1. Consistent with a complete metabolic response. Plan: --labs from today were reviewed and showed white blood cell 6.1, hemoglobin 11.6, MCV 81.1, and platelets of 163 --continue on surveillance as there is no clinical evidence of recurrence. --last CT scan in Dec 2023. Repeat due in June 2024 but patient had asked to delay until late August due to upcoming prostate surgery. Scan ordered and not performed. Will provide patient with number to call to schedule.  --RTC in 3 months with labs  #Microcytic anemia --Hgb 11.6, WBC 6.1, MCV 81.1, Plt 163 --Iron panel checked previously that shows deficiency with serum iron 16, saturation 4%, TIBC 374.  Rechecked today.  Iron saturation 9% and ferritin of 73. --Continue ferrous sulfate 325 mg once a day.  Patient notes he will restart this treatment today  #Hematuria, resolved --Likely secondary to anticoagulation (on Eliquis and Plavix), soft tissue lesion along the medial right lower kidney s/p ureteral stent placement.  --patient holding Plavix, continuing eliquis.   #Lower extremity edema -- Patient has bilateral swelling  of the legs with pealing of skin and erythema. -- Patient is no long taking IV lasix since recent hospitalization. Currently on torsemide 20 mg PO daily (though he has been taking it twice daily after skipping it for 1 week) -- Patient denies any shortness of breath or chest discomfort.  -- Patient will follow up with cardiology for any dose changes of diuretics.   #Hyperglycemia--improved --historically poorly controlled.  --glucose is 249 today --encouraged patient to stay compliant with DM medications and follow up with PCP.   No orders of the defined types were placed in this encounter.  All questions were answered. The patient knows to call the clinic with any problems, questions or concerns.  I have spent a total of 30 minutes minutes of face-to-face and non-face-to-face time, preparing to see the patient, performing a medically appropriate examination, counseling and educating the patient, ordering medications/tests,communicating with other health care professionals, documenting clinical information in the electronic health record,  and care coordination.   Ulysees Barns, MD Department of Hematology/Oncology [facility] Phone: [phone] Pager: [phone] Email: [email or web address]  09/11/2023 6:13 PM

## 2023-09-11 ENCOUNTER — Encounter: Payer: Self-pay | Admitting: Hematology and Oncology

## 2023-09-15 ENCOUNTER — Telehealth: Payer: Self-pay | Admitting: *Deleted

## 2023-09-15 NOTE — Telephone Encounter (Signed)
-----   Message from Ulysees Barns IV sent at 09/15/2023  9:01 AM EDT ----- Please let Mr. Kotz know (via interpreter or his daughter) that his iron levels are quite low. He can continue with PO iron therapy or we can boost his levels with an IV iron infusion. If he would like an iron infusion we can get that scheduled in the next 1-2 weeks. ----- Message ----- From: Leory Plowman, Lab In [place] Sent: 09/09/2023  11:01 AM EDT To: Jaci Standard, MD

## 2023-09-15 NOTE — Telephone Encounter (Signed)
TCT patient's daughter, [name] regarding recent lab results. No answer but was able to leave vm message for her to call back at her convenience to review these results.

## 2023-09-15 NOTE — Telephone Encounter (Signed)
Received call back from pt's daughter, Natasha Bence. Advised her regarding her father's low iron levels. Advised that he can continue with PO iron therapy or we can boost his levels with an IV iron infusion. If he would like an iron infusion we can get that scheduled in the next 1-2 weeks. Leitha Bleak states she will talk to him about this and then let us know. Advised to make sure he was at least, taking his po iron. She said she will check with him about that.

## 2023-09-16 ENCOUNTER — Other Ambulatory Visit: Payer: Self-pay | Admitting: Hematology and Oncology

## 2023-09-16 ENCOUNTER — Ambulatory Visit (HOSPITAL_COMMUNITY)
Admission: RE | Admit: 2023-09-16 | Discharge: 2023-09-16 | Disposition: A | Discharge status: Home / Self Care | Payer: Medicare HMO | Source: Ambulatory Visit | Attending: Hematology and Oncology | Admitting: Hematology and Oncology

## 2023-09-16 DIAGNOSIS — C8338 Diffuse large B-cell lymphoma, lymph nodes of multiple sites: Secondary | ICD-10-CM | POA: Diagnosis not present

## 2023-09-16 DIAGNOSIS — D509 Iron deficiency anemia, unspecified: Secondary | ICD-10-CM

## 2023-09-16 DIAGNOSIS — R161 Splenomegaly, not elsewhere classified: Secondary | ICD-10-CM | POA: Diagnosis not present

## 2023-09-16 DIAGNOSIS — D649 Anemia, unspecified: Secondary | ICD-10-CM

## 2023-09-16 DIAGNOSIS — I7121 Aneurysm of the ascending aorta, without rupture: Secondary | ICD-10-CM | POA: Diagnosis not present

## 2023-09-16 MED ORDER — IOHEXOL 9 MG/ML PO SOLN: 1000.0000 mL | Freq: Once | ORAL | Status: DC

## 2023-09-16 MED ORDER — IOHEXOL 9 MG/ML PO SOLN
ORAL | Status: AC
Start: 2023-09-16 — End: ?
  Filled 2023-09-16: qty 1000

## 2023-09-16 MED ORDER — IOHEXOL 9 MG/ML PO SOLN
1000.0000 mL | Freq: Once | ORAL | Status: AC
  Administered 2023-09-16: 1000 mL via ORAL

## 2023-09-22 ENCOUNTER — Other Ambulatory Visit: Payer: Self-pay | Admitting: Internal Medicine

## 2023-09-22 DIAGNOSIS — I480 Paroxysmal atrial fibrillation: Secondary | ICD-10-CM

## 2023-09-23 NOTE — Telephone Encounter (Signed)
Prescription refill request for Eliquis received. Indication:afib Last office visit:upcoming Scr:1.77  10/24 Age: 77 Weight:122.5  kg  Prescription refilled

## 2023-10-02 ENCOUNTER — Other Ambulatory Visit: Payer: Self-pay | Admitting: Internal Medicine

## 2023-10-02 DIAGNOSIS — I480 Paroxysmal atrial fibrillation: Secondary | ICD-10-CM

## 2023-10-03 NOTE — Telephone Encounter (Signed)
 Prescription refill request for Eliquis received. Indication:afib Last office visit:upcoming Scr:1.77  10/24 Age: 77 Weight:122.5  kg  Prescription refilled

## 2023-10-12 DIAGNOSIS — E1165 Type 2 diabetes mellitus with hyperglycemia: Secondary | ICD-10-CM | POA: Diagnosis not present

## 2023-10-12 DIAGNOSIS — D649 Anemia, unspecified: Secondary | ICD-10-CM | POA: Diagnosis not present

## 2023-10-12 DIAGNOSIS — M439 Deforming dorsopathy, unspecified: Secondary | ICD-10-CM | POA: Diagnosis not present

## 2023-10-12 DIAGNOSIS — G4733 Obstructive sleep apnea (adult) (pediatric): Secondary | ICD-10-CM | POA: Diagnosis not present

## 2023-10-12 DIAGNOSIS — I10 Essential (primary) hypertension: Secondary | ICD-10-CM | POA: Diagnosis not present

## 2023-10-12 DIAGNOSIS — E7800 Pure hypercholesterolemia, unspecified: Secondary | ICD-10-CM | POA: Diagnosis not present

## 2023-10-12 DIAGNOSIS — C8510 Unspecified B-cell lymphoma, unspecified site: Secondary | ICD-10-CM | POA: Diagnosis not present

## 2023-10-12 DIAGNOSIS — I2510 Atherosclerotic heart disease of native coronary artery without angina pectoris: Secondary | ICD-10-CM | POA: Diagnosis not present

## 2023-10-12 DIAGNOSIS — I482 Chronic atrial fibrillation, unspecified: Secondary | ICD-10-CM | POA: Diagnosis not present

## 2023-10-17 ENCOUNTER — Telehealth: Payer: Self-pay | Admitting: Family Medicine

## 2023-10-17 ENCOUNTER — Other Ambulatory Visit (HOSPITAL_BASED_OUTPATIENT_CLINIC_OR_DEPARTMENT_OTHER): Payer: Self-pay | Admitting: Family Medicine

## 2023-10-17 ENCOUNTER — Other Ambulatory Visit (HOSPITAL_BASED_OUTPATIENT_CLINIC_OR_DEPARTMENT_OTHER): Payer: Self-pay | Admitting: *Deleted

## 2023-10-17 ENCOUNTER — Other Ambulatory Visit: Payer: Self-pay | Admitting: Family Medicine

## 2023-10-17 DIAGNOSIS — G4733 Obstructive sleep apnea (adult) (pediatric): Secondary | ICD-10-CM

## 2023-10-17 MED ORDER — METOPROLOL TARTRATE 50 MG PO TABS: 50.0000 mg | ORAL_TABLET | Freq: Once | ORAL | 3 refills | Status: DC

## 2023-10-17 NOTE — Telephone Encounter (Signed)
Amy approved for me to adjust settings on resmed airview website for pt. I adjusted settings.

## 2023-10-17 NOTE — Telephone Encounter (Signed)
Can you please attach 30 day compliance review to monitor AHI following pressure change. TY.

## 2023-10-25 ENCOUNTER — Ambulatory Visit (INDEPENDENT_AMBULATORY_CARE_PROVIDER_SITE_OTHER): Payer: Medicare HMO | Admitting: Family Medicine

## 2023-10-25 ENCOUNTER — Encounter (HOSPITAL_BASED_OUTPATIENT_CLINIC_OR_DEPARTMENT_OTHER): Payer: Self-pay | Admitting: *Deleted

## 2023-10-25 ENCOUNTER — Encounter (HOSPITAL_BASED_OUTPATIENT_CLINIC_OR_DEPARTMENT_OTHER): Payer: Self-pay | Admitting: Family Medicine

## 2023-10-25 VITALS — BP 132/74 | HR 63 | Ht 69.0 in | Wt 274.7 lb

## 2023-10-25 DIAGNOSIS — E1122 Type 2 diabetes mellitus with diabetic chronic kidney disease: Secondary | ICD-10-CM

## 2023-10-25 DIAGNOSIS — I872 Venous insufficiency (chronic) (peripheral): Secondary | ICD-10-CM | POA: Insufficient documentation

## 2023-10-25 DIAGNOSIS — Z794 Long term (current) use of insulin: Secondary | ICD-10-CM | POA: Diagnosis not present

## 2023-10-25 DIAGNOSIS — M439 Deforming dorsopathy, unspecified: Secondary | ICD-10-CM | POA: Insufficient documentation

## 2023-10-25 DIAGNOSIS — E113499 Type 2 diabetes mellitus with severe nonproliferative diabetic retinopathy without macular edema, unspecified eye: Secondary | ICD-10-CM | POA: Insufficient documentation

## 2023-10-25 DIAGNOSIS — M7989 Other specified soft tissue disorders: Secondary | ICD-10-CM | POA: Diagnosis not present

## 2023-10-25 DIAGNOSIS — Z6841 Body Mass Index (BMI) 40.0 and over, adult: Secondary | ICD-10-CM | POA: Diagnosis not present

## 2023-10-25 DIAGNOSIS — N182 Chronic kidney disease, stage 2 (mild): Secondary | ICD-10-CM | POA: Diagnosis not present

## 2023-10-25 DIAGNOSIS — E1142 Type 2 diabetes mellitus with diabetic polyneuropathy: Secondary | ICD-10-CM | POA: Insufficient documentation

## 2023-10-25 MED ORDER — INSULIN PEN NEEDLE 30G X 8 MM MISC: 1.0000 | 6 refills | Status: AC | PRN

## 2023-10-25 MED ORDER — FINASTERIDE 5 MG PO TABS: 5.0000 mg | ORAL_TABLET | Freq: Every day | ORAL | 0 refills | Status: DC

## 2023-10-25 MED ORDER — TORSEMIDE 20 MG PO TABS: 40.0000 mg | ORAL_TABLET | Freq: Every day | ORAL | 0 refills | Status: DC

## 2023-10-25 MED ORDER — METFORMIN HCL 1000 MG PO TABS: 1000.0000 mg | ORAL_TABLET | Freq: Two times a day (BID) | ORAL | 1 refills | Status: DC

## 2023-10-25 NOTE — Assessment & Plan Note (Signed)
Patient reports that he has had ongoing bilateral lower extremity swelling with skin changes noted including redness, warmth.  Patient and wife also indicate that he had small abrasion to outside of right lower extremity a few weeks ago and this continues to have a small amount of drainage and has not completely healed.  He does try to elevate legs during the day.  Notes that swelling will be worse as the day goes on.  Denies any notable tenderness or pain through lower extremities. On exam, patient is in no acute distress, vital signs stable.  Bilateral lower extremities with moderate erythema present.  Right lower extremity with gauze in place over the lateral aspect with serous drainage noted.  No drainage noted elsewhere on right lower extremity, no drainage from left lower extremity. Given appearance and progression of swelling, recommend continued close follow-up with cardiology, however would also recommend evaluation with vascular specialist.  Patient in agreement, referral placed today.  Recommend continuing with intermittent elevation of bilateral lower extremities.

## 2023-10-25 NOTE — Assessment & Plan Note (Signed)
Management as per below

## 2023-10-25 NOTE — Progress Notes (Signed)
    Procedures performed today:    None.  Independent interpretation of notes and tests performed by another provider:   None.  Brief History, Exam, Impression, and Recommendations:    BP 132/74 (BP Location: Left Arm, Patient Position: Sitting, Cuff Size: Normal)   Pulse 63   Ht 5\' 9"  (1.753 m)   Wt 274 lb 11.2 oz (124.6 kg)   SpO2 96%   BMI 40.57 kg/m   Localized swelling of lower extremity Assessment & Plan: Management as per below  Orders: -     Ambulatory referral to Vascular Surgery  Type 2 diabetes mellitus with stage 2 chronic kidney disease, with long-term current use of insulin (HCC) Assessment & Plan: Patient continues with insulin, metformin.  Patient continues with insulin at 34 units daily.  He has established with endocrinologist, reports that he is also administering once weekly injection, however not certain name of medication.  Reports that he has not had any issues with this new medication. Recommend continued close follow-up with endocrinologist.  We will request records from endocrinology.  Up-to-date with foot exam.  Will be needing updated nephropathy screening in about 1 month.  Orders: -     Insulin Pen Needle; Inject 10 each into the skin as needed.  Dispense: 100 each; Refill: 6  Stasis dermatitis of both legs Assessment & Plan: Patient reports that he has had ongoing bilateral lower extremity swelling with skin changes noted including redness, warmth.  Patient and wife also indicate that he had small abrasion to outside of right lower extremity a few weeks ago and this continues to have a small amount of drainage and has not completely healed.  He does try to elevate legs during the day.  Notes that swelling will be worse as the day goes on.  Denies any notable tenderness or pain through lower extremities. On exam, patient is in no acute distress, vital signs stable.  Bilateral lower extremities with moderate erythema present.  Right lower extremity  with gauze in place over the lateral aspect with serous drainage noted.  No drainage noted elsewhere on right lower extremity, no drainage from left lower extremity. Given appearance and progression of swelling, recommend continued close follow-up with cardiology, however would also recommend evaluation with vascular specialist.  Patient in agreement, referral placed today.  Recommend continuing with intermittent elevation of bilateral lower extremities.  Orders: -     Ambulatory referral to Vascular Surgery  Other orders -     metFORMIN HCl; Take 1 tablet (1,000 mg total) by mouth 2 (two) times daily with a meal.  Dispense: 180 tablet; Refill: 1 -     Torsemide; Take 2 tablets (40 mg total) by mouth daily.  Dispense: 30 tablet; Refill: 0 -     Finasteride; Take 1 tablet (5 mg total) by mouth at bedtime.  Dispense: 90 tablet; Refill: 0  Return in about 3 months (around 01/23/2024) for diabetes, referral.  Spent 34 minutes on this patient encounter, including preparation, chart review, face-to-face counseling with patient and coordination of care, and documentation of encounter   ___________________________________________ Synia Douglass de [place], MD, ABFM, CAQSM Primary Care and Sports Medicine [facility]

## 2023-10-25 NOTE — Assessment & Plan Note (Signed)
Patient continues with insulin, metformin.  Patient continues with insulin at 34 units daily.  He has established with endocrinologist, reports that he is also administering once weekly injection, however not certain name of medication.  Reports that he has not had any issues with this new medication. Recommend continued close follow-up with endocrinologist.  We will request records from endocrinology.  Up-to-date with foot exam.  Will be needing updated nephropathy screening in about 1 month.

## 2023-10-25 NOTE — Patient Instructions (Signed)
  Medication Instructions:  Your physician recommends that you continue on your current medications as directed. Please refer to the Current Medication list given to you today. --If you need a refill on any your medications before your next appointment, please call your pharmacy first. If no refills are authorized on file call the office.-- Lab Work:  If you have labs (blood work) drawn today and your tests are completely normal, you will receive your results via MyChart message OR a phone call from our staff.  Please ensure you check your voicemail in the event that you authorized detailed messages to be left on a delegated number. If you have any lab test that is abnormal or we need to change your treatment, we will call you to review the results.    Follow-Up: Your next appointment:   Your physician recommends that you schedule a follow-up appointment in: 3-4 month follow up  with Dr. de [place] (40 MINUTE APPT)  You will receive a text message or e-mail with a link to a survey about your care and experience with [place] today! We would greatly appreciate your feedback!   Thanks for letting us be apart of your health journey!!  Primary Care and Sports Medicine   Dr. Ceasar Mons [place]   We encourage you to activate your patient portal called "MyChart".  Sign up information is provided on this After Visit Summary.  MyChart is used to connect with patients for Virtual Visits (Telemedicine).  Patients are able to view lab/test results, encounter notes, upcoming appointments, etc.  Non-urgent messages can be sent to your provider as well. To learn more about what you can do with MyChart, please visit --  [email or web address].

## 2023-11-13 ENCOUNTER — Other Ambulatory Visit (HOSPITAL_BASED_OUTPATIENT_CLINIC_OR_DEPARTMENT_OTHER): Payer: Self-pay | Admitting: Family Medicine

## 2023-11-13 DIAGNOSIS — E1122 Type 2 diabetes mellitus with diabetic chronic kidney disease: Secondary | ICD-10-CM

## 2023-11-18 ENCOUNTER — Other Ambulatory Visit: Payer: Self-pay | Admitting: Internal Medicine

## 2023-11-24 NOTE — Progress Notes (Signed)
**Note Stanley-Identified via Obfuscation**  Cardiology Office Note    Patient Name: [name] Date of Encounter: 11/25/2023  Primary Care Provider:  [place], Quintin PARAS, MD Primary Cardiologist:  Stanley Gull, MD Primary Electrophysiologist: None   Past Medical History    Past Medical History:  Diagnosis Date   Anginal pain [facility])    none recent per daughter [name] on 10-22-2021   Coronary artery disease    diffuse large b cell lymphoma    on chemo last infusion 09-26-2021   DM type 2    Dysrhythmia    atrial fib on anticoagulation   Easy bruising 06/18/2021   Endocarditis    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 male with a PMH of CAD (s/p PCI/DES to RCA in Jan 2022), NSVT, PAF (on Eliquis ), HTN, NSVT, thoracic aneurysm (4.7 cm), mild AS type II DM, OSA Large B cell lymphoma, endocarditis 05/2021, OSA (on CPAP) who presents today for follow-up.  [name] a previous coronary history of PCI to RCA in January 2022.  He was previously followed by [facility] cardiology.  He was admitted on 05/2021 and treated for endocarditis in the setting of UTI with sepsis and blood cultures revealed Pseudomonas. He underwent a TEE  that showed no clear evidence of vegetation and patient was discharged with home antibiotics empirically for management of endocarditis.  He was admitted on 01/2022 and of shortness of breath and lower extremity edema.  He was found to have elevated troponin felt to be related to demand ischemia.  He was diuresed with Lasix  and found to have CHF exacerbation.  He required BiPAP on admission and chest x-ray was completed which was worrisome for multifocal pneumonia.   He was placed on antibiotics and discharged in stable condition.  He was last seen by Dr. Gull on 06/2022 for follow-up visit and patient was still experiencing lower extremity edema but breathing was stable and had no complaints of chest pain.  He underwent repeat 2D echo that showed normal EF of 55 to 60% with no RWMA and grade 1 DD, mild aortic stenosis with mean gradient of 13 mmHg, mild to moderate dilation of the RA/LA and severe dilation of the measuring 47 mm. He recently completed a CT of the chest in 09/2023 with stable dilation. He was seen by his PCP on 10/25/23 and was having ongoing LE swelling with erythema and referral was made to vascular for evaluation.  [name] presents today with his daughter and the assistance of interpreter services for follow-up.  During today's visit patient reports that he has been feeling well from a cardiac perspective.  He denies any chest pain, shortness of breath, or palpitations.  He is staying active and walks outside 1 to 2 miles up his road near his home.  He is tolerating his current medications without any adverse reactions with the exception of atorvastatin  which caused back pain and myalgia.  He is euvolemic on exam today with a net weight loss of 30 pounds since beginning torsemide .  Through discussion of his diet he does endorse some indiscretions  with salt.  We discussed the importance of primary prevention and the need to avoid heavy lifting in the setting of thoracic aneurysm.  Patient denies chest pain, palpitations, dyspnea, PND, orthopnea, nausea, vomiting, dizziness, syncope, edema, weight gain, or early satiety.  Review of Systems  Please see the history of present illness.    All other systems reviewed and are otherwise negative except as noted above.  Physical Exam    Wt Readings from Last 3 Encounters:  11/25/23 246 lb (111.6 kg)  10/25/23 274 lb 11.2 oz (124.6 kg)  09/09/23 270 lb 1.6 oz (122.5 kg)   VS: Vitals:   11/25/23 1027   BP: 114/70  Pulse: 68  SpO2: 97%  ,Body mass index is 36.33 kg/m. GEN: Well nourished, well developed in no acute distress Neck: No JVD; No carotid bruits Pulmonary: Clear to auscultation without rales, wheezing or rhonchi  Cardiovascular: Normal rate. Regular rhythm. Normal S1. Normal S2.   Murmurs: 2 out of 6 systolic murmur ABDOMEN: Soft, non-tender, non-distended EXTREMITIES: Bilateral +1 lower extremity edema with open fissure superficial on right lower extremity  EKG/LABS/ Recent Cardiac Studies   ECG personally reviewed by me today -sinus rhythm with first-degree AVB and left axis deviation with no acute changes consistent with previous EKG.  Risk Assessment/Calculations:    CHA2DS2-VASc Score = 6   This indicates a 9.7% annual risk of stroke. The patient's score is based upon: CHF History: 1 HTN History: 1 Diabetes History: 1 Stroke History: 0 Vascular Disease History: 1 Age Score: 2 Gender Score: 0         Lab Results  Component Value Date   WBC 6.1 09/09/2023   HGB 11.6 (L) 09/09/2023   HCT 36.8 (L) 09/09/2023   MCV 81.1 09/09/2023   PLT 163 09/09/2023   Lab Results  Component Value Date   CREATININE 1.77 (H) 09/09/2023   BUN 29 (H) 09/09/2023   NA 140 09/09/2023   K 4.8 09/09/2023   CL 104 09/09/2023   CO2 28 09/09/2023   No results found for: CHOL, HDL, LDLCALC, LDLDIRECT, TRIG, CHOLHDL  Lab Results  Component Value Date   HGBA1C 8.5 (A) 07/26/2023   HGBA1C 8.5 07/26/2023   Assessment & Plan    1.  Coronary artery disease: -s/p previous LHC and PCI to RCA in 2022 -Patient reports no chest pain or angina with activity. -Is currently not on ASA therapy due to Eliquis  and was advised to continue tartrate 50 mg twice daily, Nitrostat 0.3 mg as needed and Crestor  10 mg daily -Continue physical activity as tolerated  2.  HFpEF: -2D echo completed showing EF of 55 to 60% with no RWMA and grade 1 DD, mild aortic stenosis with mean  gradient of 13 mmHg, mild to moderate dilation of the RA/LA and severe dilation of the measuring 47 mm.  -Today patient is euvolemic +1 chronic lower extremity edema -He reports indiscretions with salt and was advised to monitor her salt intake and advised to keep fluid intake to 64 ounces daily -Continue torsemide  20 mg twice daily -Low sodium diet, fluid restriction <2L, and daily weights encouraged. Educated to contact our office for weight gain of 2 lbs overnight or 5 lbs in one week.   3.  Essential hypertension: -Patient's blood pressure today was stable at 114/70 -Continue Norvasc  10 mg daily, metoprolol  50 mg twice daily  4.  Paroxysmal AF: -Patient is sinus rhythm today and currently rate controlled on amiodarone  200 mg daily -Patient's most  recent creatinine was 1.7 and hemoglobin was 11.6 -Continue Eliquis  5 mg twice daily -CHA2DS2-VASc Score = 6 [CHF History: 1, HTN History: 1, Diabetes History: 1, Stroke History: 0, Vascular Disease History: 1, Age Score: 2, Gender Score: 0].  Therefore, the patient's annual risk of stroke is 9.7 %.      5.  Nonrheumatic AS: -Patient reports no shortness of breath or chest pain  -Continue torsemide  20 mg twice daily  6.  Lower extremity edema: -Improved with addition of torsemide  20 mg twice daily -He was referred to [facility] by PCP and is awaiting contact from office -We may try with reduction in amlodipine  to 5 mg to see if lower extremity swelling improves in the future.  7.  Ascending thoracic aneurysm: -Most recent CT of the chest completed in 09/2023 showing stable aneurysm at 4.7 cm with recommendation of repeat in 3 months  Disposition: Follow-up with Stanley Gull, MD or APP in 6 months    Signed, Wyn Raddle, Jackee Shove, NP 11/25/2023, 11:10 AM [facility]

## 2023-11-25 ENCOUNTER — Encounter: Payer: Self-pay | Admitting: Nurse Practitioner

## 2023-11-25 ENCOUNTER — Other Ambulatory Visit: Payer: Self-pay | Admitting: *Deleted

## 2023-11-25 ENCOUNTER — Ambulatory Visit: Payer: Medicare HMO | Attending: Nurse Practitioner | Admitting: Nurse Practitioner

## 2023-11-25 VITALS — BP 114/70 | HR 68 | Ht 69.0 in | Wt 246.0 lb

## 2023-11-25 DIAGNOSIS — I2510 Atherosclerotic heart disease of native coronary artery without angina pectoris: Secondary | ICD-10-CM

## 2023-11-25 DIAGNOSIS — E1159 Type 2 diabetes mellitus with other circulatory complications: Secondary | ICD-10-CM | POA: Diagnosis not present

## 2023-11-25 DIAGNOSIS — I350 Nonrheumatic aortic (valve) stenosis: Secondary | ICD-10-CM | POA: Diagnosis not present

## 2023-11-25 DIAGNOSIS — I7121 Aneurysm of the ascending aorta, without rupture: Secondary | ICD-10-CM

## 2023-11-25 DIAGNOSIS — I5032 Chronic diastolic (congestive) heart failure: Secondary | ICD-10-CM

## 2023-11-25 DIAGNOSIS — I480 Paroxysmal atrial fibrillation: Secondary | ICD-10-CM

## 2023-11-25 DIAGNOSIS — I152 Hypertension secondary to endocrine disorders: Secondary | ICD-10-CM | POA: Diagnosis not present

## 2023-11-25 MED ORDER — ROSUVASTATIN CALCIUM 10 MG PO TABS: 10.0000 mg | ORAL_TABLET | Freq: Every day | ORAL | 3 refills | Status: DC

## 2023-11-25 NOTE — Patient Instructions (Signed)
 Medication Instructions:   DISCONTINUE Lipitor  START Rosuvastatin  one (1 tablet) by mouth ( 10 mg ) daily.  *If you need a refill on your cardiac medications before your next appointment, please call your pharmacy*   Lab Work:  Your physician recommends that you return for a FASTING lipid profile 8 weeks at the any [facility].   If you have labs (blood work) drawn today and your tests are completely normal, you will receive your results only by: MyChart Message (if you have MyChart) OR A paper copy in the mail If you have any lab test that is abnormal or we need to change your treatment, we will call you to review the results.   Testing/Procedures:  Non-Cardiac CT scanning, (CAT scanning), is a noninvasive, special x-ray that produces cross-sectional images of the body using x-rays and a computer. CT scans help physicians diagnose and treat medical conditions. For some CT exams, a contrast material is used to enhance visibility in the area of the body being studied. CT scans provide greater clarity and reveal more details than regular x-ray exams.   CALL [facility] [phone].    Follow-Up: At [facility], you and your health needs are our priority.  As part of our continuing mission to provide you with exceptional heart care, we have created designated Provider Care Teams.  These Care Teams include your primary Cardiologist (physician) and Advanced Practice Providers (APPs -  Physician Assistants and Nurse Practitioners) who all work together to provide you with the care you need, when you need it.  We recommend signing up for the patient portal called MyChart.  Sign up information is provided on this After Visit Summary.  MyChart is used to connect with patients for Virtual Visits (Telemedicine).  Patients are able to view lab/test results, encounter notes, upcoming appointments, etc.  Non-urgent messages can be sent to your provider as well.   To learn more about what you can  do with MyChart, go to [email or web address].    Your next appointment:   6 month(s)  Provider:   Vina Gull, MD     Other Instructions  Your physician wants you to follow-up in: 6 months.  You will receive a reminder letter in the mail two months in advance. If you don't receive a letter, please call our office to schedule the follow-up appointment.    1st Floor: - Lobby - Registration  - Pharmacy  - Lab - Cafe  2nd Floor: - PV Lab - Diagnostic Testing (echo, CT, nuclear med)  3rd Floor: - Vacant  4th Floor: - TCTS (cardiothoracic surgery) - AFib Clinic - Structural Heart Clinic - Vascular Surgery  - Vascular Ultrasound  5th Floor: - HeartCare Cardiology (general and EP) - Clinical Pharmacy for coumadin, hypertension, lipid, weight-loss medications, and med management appointments    Valet parking services will be available as well.       DASH Eating Plan DASH stands for Dietary Approaches to Stop Hypertension. The DASH eating plan is a healthy eating plan that has been shown to: Lower high blood pressure (hypertension). Reduce your risk for type 2 diabetes, heart disease, and stroke. Help with weight loss. What are tips for following this plan? Reading food labels Check food labels for the amount of salt (sodium) per serving. Choose foods with less than 5 percent of the Daily Value (DV) of sodium. In general, foods with less than 300 milligrams (mg) of sodium per serving fit into this eating plan. To find whole grains, look  for the word whole as the first word in the ingredient list. Shopping Buy products labeled as low-sodium or no salt added. Buy fresh foods. Avoid canned foods and pre-made or frozen meals. Cooking Try not to add salt when you cook. Use salt-free seasonings or herbs instead of table salt or sea salt. Check with your health care provider or pharmacist before using salt substitutes. Do not fry foods. Cook foods in healthy ways,  such as baking, boiling, grilling, roasting, or broiling. Cook using oils that are good for your heart. These include olive, canola, avocado, soybean, and sunflower oil. Meal planning  Eat a balanced diet. This should include: 4 or more servings of fruits and 4 or more servings of vegetables each day. Try to fill half of your plate with fruits and vegetables. 6-8 servings of whole grains each day. 6 or less servings of lean meat, poultry, or fish each day. 1 oz is 1 serving. A 3 oz (85 g) serving of meat is about the same size as the palm of your hand. One egg is 1 oz (28 g). 2-3 servings of low-fat dairy each day. One serving is 1 cup (237 mL). 1 serving of nuts, seeds, or beans 5 times each week. 2-3 servings of heart-healthy fats. Healthy fats called omega-3 fatty acids are found in foods such as walnuts, flaxseeds, fortified milks, and eggs. These fats are also found in cold-water  fish, such as sardines, salmon, and mackerel. Limit how much you eat of: Canned or prepackaged foods. Food that is high in trans fat, such as fried foods. Food that is high in saturated fat, such as fatty meat. Desserts and other sweets, sugary drinks, and other foods with added sugar. Full-fat dairy products. Do not salt foods before eating. Do not eat more than 4 egg yolks a week. Try to eat at least 2 vegetarian meals a week. Eat more home-cooked food and less restaurant, buffet, and fast food. Lifestyle When eating at a restaurant, ask if your food can be made with less salt or no salt. If you drink alcohol: Limit how much you have to: 0-1 drink a day if you are male. 0-2 drinks a day if you are male. Know how much alcohol is in your drink. In the [place] one drink is one 12 oz bottle of beer (355 mL), one 5 oz glass of wine (148 mL), or one 1 oz glass of hard liquor (44 mL). General information Avoid eating more than 2,300 mg of salt a day. If you have hypertension, you may need to reduce your sodium  intake to 1,500 mg a day. Work with your provider to stay at a healthy body weight or lose weight. Ask what the best weight range is for you. On most days of the week, get at least 30 minutes of exercise that causes your heart to beat faster. This may include walking, swimming, or biking. Work with your provider or dietitian to adjust your eating plan to meet your specific calorie needs. What foods should I eat? Fruits All fresh, dried, or frozen fruit. Canned fruits that are in their natural juice and do not have sugar added to them. Vegetables Fresh or frozen vegetables that are raw, steamed, roasted, or grilled. Low-sodium or reduced-sodium tomato and vegetable juice. Low-sodium or reduced-sodium tomato sauce and tomato paste. Low-sodium or reduced-sodium canned vegetables. Grains Whole-grain or whole-wheat bread. Whole-grain or whole-wheat pasta. Brown rice. Mcneil Madeira. Bulgur. Whole-grain and low-sodium cereals. Pita bread. Low-fat, low-sodium crackers. Whole-wheat  flour tortillas. Meats and other proteins Skinless chicken or turkey. Ground chicken or turkey. Pork with fat trimmed off. Fish and seafood. Egg whites. Dried beans, peas, or lentils. Unsalted nuts, nut butters, and seeds. Unsalted canned beans. Lean cuts of beef with fat trimmed off. Low-sodium, lean precooked or cured meat, such as sausages or meat loaves. Dairy Low-fat (1%) or fat-free (skim) milk. Reduced-fat, low-fat, or fat-free cheeses. Nonfat, low-sodium ricotta or cottage cheese. Low-fat or nonfat yogurt. Low-fat, low-sodium cheese. Fats and oils Soft margarine without trans fats. Vegetable oil. Reduced-fat, low-fat, or light mayonnaise and salad dressings (reduced-sodium). Canola, safflower, olive, avocado, soybean, and sunflower oils. Avocado. Seasonings and condiments Herbs. Spices. Seasoning mixes without salt. Other foods Unsalted popcorn and pretzels. Fat-free sweets. The items listed above may not be all the  foods and drinks you can have. Talk to a dietitian to learn more. What foods should I avoid? Fruits Canned fruit in a light or heavy syrup. Fried fruit. Fruit in cream or butter sauce. Vegetables Creamed or fried vegetables. Vegetables in a cheese sauce. Regular canned vegetables that are not marked as low-sodium or reduced-sodium. Regular canned tomato sauce and paste that are not marked as low-sodium or reduced-sodium. Regular tomato and vegetable juices that are not marked as low-sodium or reduced-sodium. Dene. Olives. Grains Baked goods made with fat, such as croissants, muffins, or some breads. Dry pasta or rice meal packs. Meats and other proteins Fatty cuts of meat. Ribs. Fried meat. [place]. Bologna, salami, and other precooked or cured meats, such as sausages or meat loaves, that are not lean and low in sodium. Fat from the back of a pig (fatback). Bratwurst. Salted nuts and seeds. Canned beans with added salt. Canned or smoked fish. Whole eggs or egg yolks. Chicken or turkey with skin. Dairy Whole or 2% milk, cream, and half-and-half. Whole or full-fat cream cheese. Whole-fat or sweetened yogurt. Full-fat cheese. Nondairy creamers. Whipped toppings. Processed cheese and cheese spreads. Fats and oils Butter. Stick margarine. Lard. Shortening. Ghee. Bacon fat. Tropical oils, such as coconut, palm kernel, or palm oil. Seasonings and condiments Onion salt, garlic salt, seasoned salt, table salt, and sea salt. Worcestershire sauce. Tartar sauce. Barbecue sauce. Teriyaki sauce. Soy sauce, including reduced-sodium soy sauce. Steak sauce. Canned and packaged gravies. Fish sauce. Oyster sauce. Cocktail sauce. Store-bought horseradish. Ketchup. Mustard. Meat flavorings and tenderizers. Bouillon cubes. Hot sauces. Pre-made or packaged marinades. Pre-made or packaged taco seasonings. Relishes. Regular salad dressings. Other foods Salted popcorn and pretzels. The items listed above may not be all the  foods and drinks you should avoid. Talk to a dietitian to learn more. Where to find more information National Heart, Lung, and Blood Institute (NHLBI): buffalodrycleaner.gl American Heart Association (AHA): heart.org Academy of Nutrition and Dietetics: eatright.org National Kidney Foundation (NKF): kidney.org This information is not intended to replace advice given to you by your health care provider. Make sure you discuss any questions you have with your health care provider. Document Revised: 11/18/2022 Document Reviewed: 11/18/2022 Elsevier Patient Education  2024 Elsevier Inc.  Adopting a Healthy Lifestyle.   Weight: Know what a healthy weight is for you (roughly BMI <25) and aim to maintain this. You can calculate your body mass index on your smart phone. Unfortunately, this is not the most accurate measure of healthy weight, but it is the simplest measurement to use. A more accurate measurement involves body scanning which measures lean muscle, fat tissue and bony density. We do not have this equipment at [facility].  Diet: Aim for 7+ servings of fruits and vegetables daily Limit animal fats in diet for cholesterol and heart health - choose grass fed whenever available Avoid highly processed foods (fast food burgers, tacos, fried chicken, pizza, hot dogs, french fries)  Saturated fat comes in the form of butter, lard, coconut oil, margarine, partially hydrogenated oils, and fat in meat. These increase your risk of cardiovascular disease.  Use healthy plant oils, such as olive, canola, soy, corn, sunflower and peanut.  Whole foods such as fruits, vegetables and whole grains have fiber  Men need > 38 grams of fiber per day Women need > 25 grams of fiber per day  Load up on vegetables and fruits - one-half of your plate: Aim for color and variety, and remember that potatoes dont count. Go for whole grains - one-quarter of your plate: Whole wheat, barley, wheat berries, quinoa, oats, brown rice,  and foods made with them. If you want pasta, go with whole wheat pasta. Protein power - one-quarter of your plate: Fish, chicken, beans, and nuts are all healthy, versatile protein sources. Limit red meat. You need carbohydrates for energy! The type of carbohydrate is more important than the amount. Choose carbohydrates such as vegetables, fruits, whole grains, beans, and nuts in the place of white rice, white pasta, potatoes (baked or fried), macaroni and cheese, cakes, cookies, and donuts.  If youre thirsty, drink water . Coffee and tea are good in moderation, but skip sugary drinks and limit milk and dairy products to one or two daily servings. Keep sugar intake at 6 teaspoons or 24 grams or LESS       Exercise: Aim for 150 min of moderate intensity exercise weekly for heart health, and weights twice weekly for bone health Stay active - any steps are better than no steps! Aim for 7-9 hours of sleep daily

## 2023-11-28 ENCOUNTER — Other Ambulatory Visit: Payer: Self-pay | Admitting: Internal Medicine

## 2023-12-01 DIAGNOSIS — I700 Atherosclerosis of aorta: Secondary | ICD-10-CM | POA: Diagnosis not present

## 2023-12-01 DIAGNOSIS — C9001 Multiple myeloma in remission: Secondary | ICD-10-CM | POA: Diagnosis not present

## 2023-12-01 DIAGNOSIS — C833A Diffuse large b-cell lymphoma, in remission: Secondary | ICD-10-CM | POA: Diagnosis not present

## 2023-12-01 DIAGNOSIS — D6869 Other thrombophilia: Secondary | ICD-10-CM | POA: Diagnosis not present

## 2023-12-01 DIAGNOSIS — E1142 Type 2 diabetes mellitus with diabetic polyneuropathy: Secondary | ICD-10-CM | POA: Diagnosis not present

## 2023-12-01 DIAGNOSIS — D509 Iron deficiency anemia, unspecified: Secondary | ICD-10-CM | POA: Diagnosis not present

## 2023-12-01 DIAGNOSIS — I2510 Atherosclerotic heart disease of native coronary artery without angina pectoris: Secondary | ICD-10-CM | POA: Diagnosis not present

## 2023-12-01 DIAGNOSIS — I87309 Chronic venous hypertension (idiopathic) without complications of unspecified lower extremity: Secondary | ICD-10-CM | POA: Diagnosis not present

## 2023-12-01 DIAGNOSIS — E11620 Type 2 diabetes mellitus with diabetic dermatitis: Secondary | ICD-10-CM | POA: Diagnosis not present

## 2023-12-01 DIAGNOSIS — I4891 Unspecified atrial fibrillation: Secondary | ICD-10-CM | POA: Diagnosis not present

## 2023-12-01 DIAGNOSIS — E785 Hyperlipidemia, unspecified: Secondary | ICD-10-CM | POA: Diagnosis not present

## 2023-12-01 DIAGNOSIS — Z7985 Long-term (current) use of injectable non-insulin antidiabetic drugs: Secondary | ICD-10-CM | POA: Diagnosis not present

## 2023-12-01 DIAGNOSIS — I472 Ventricular tachycardia, unspecified: Secondary | ICD-10-CM | POA: Diagnosis not present

## 2023-12-01 DIAGNOSIS — Z7901 Long term (current) use of anticoagulants: Secondary | ICD-10-CM | POA: Diagnosis not present

## 2023-12-01 DIAGNOSIS — M5430 Sciatica, unspecified side: Secondary | ICD-10-CM | POA: Diagnosis not present

## 2023-12-01 DIAGNOSIS — M4800 Spinal stenosis, site unspecified: Secondary | ICD-10-CM | POA: Diagnosis not present

## 2023-12-01 DIAGNOSIS — N400 Enlarged prostate without lower urinary tract symptoms: Secondary | ICD-10-CM | POA: Diagnosis not present

## 2023-12-01 DIAGNOSIS — I130 Hypertensive heart and chronic kidney disease with heart failure and stage 1 through stage 4 chronic kidney disease, or unspecified chronic kidney disease: Secondary | ICD-10-CM | POA: Diagnosis not present

## 2023-12-01 DIAGNOSIS — M1990 Unspecified osteoarthritis, unspecified site: Secondary | ICD-10-CM | POA: Diagnosis not present

## 2023-12-01 DIAGNOSIS — E113599 Type 2 diabetes mellitus with proliferative diabetic retinopathy without macular edema, unspecified eye: Secondary | ICD-10-CM | POA: Diagnosis not present

## 2023-12-01 DIAGNOSIS — I509 Heart failure, unspecified: Secondary | ICD-10-CM | POA: Diagnosis not present

## 2023-12-01 DIAGNOSIS — I872 Venous insufficiency (chronic) (peripheral): Secondary | ICD-10-CM | POA: Diagnosis not present

## 2023-12-01 DIAGNOSIS — N1831 Chronic kidney disease, stage 3a: Secondary | ICD-10-CM | POA: Diagnosis not present

## 2023-12-01 DIAGNOSIS — D849 Immunodeficiency, unspecified: Secondary | ICD-10-CM | POA: Diagnosis not present

## 2023-12-01 DIAGNOSIS — G4733 Obstructive sleep apnea (adult) (pediatric): Secondary | ICD-10-CM | POA: Diagnosis not present

## 2023-12-01 DIAGNOSIS — R2689 Other abnormalities of gait and mobility: Secondary | ICD-10-CM | POA: Diagnosis not present

## 2023-12-02 ENCOUNTER — Other Ambulatory Visit: Payer: Self-pay | Admitting: Internal Medicine

## 2023-12-15 ENCOUNTER — Other Ambulatory Visit: Payer: Self-pay | Admitting: Internal Medicine

## 2023-12-28 ENCOUNTER — Telehealth (HOSPITAL_BASED_OUTPATIENT_CLINIC_OR_DEPARTMENT_OTHER): Payer: Self-pay | Admitting: *Deleted

## 2023-12-28 NOTE — Telephone Encounter (Signed)
Spoke with patient see other note.

## 2023-12-28 NOTE — Telephone Encounter (Signed)
Patient was identified as falling into the [place] Measure - Diabetes.   Patient was: Appointment scheduled with primary care provider in the next 30 days.   Patient not seeing Dr Tommi Rumps [place] in the next 30 days but is scheduled with Dr Karena Addison - endocrinologist on 2/27. Advised daughter who goes to appt with them to have him send over the A1c result and make sure this is rechecked at that office with verbal understanding

## 2023-12-28 NOTE — Telephone Encounter (Signed)
Patient was identified as falling into the [place] Measure - Diabetes.   Patient was: Left voicemail to schedule with primary care provider.   Can schedule with provider if patient would like to follow up or as nurse visit for A1c check

## 2023-12-28 NOTE — Telephone Encounter (Signed)
Copied from CRM [id number]. Topic: General - Other >> Dec 28, 2023  2:09 PM Antwanette L wrote: Reason for CRM: Patient daughter Natasha Bence) is calling because Ishmael Holter left a voicemail. The patient daughter is wanting more information before scheduling an appointment with primary provider. Natasha Bence can be reached at [phone]

## 2024-01-06 ENCOUNTER — Other Ambulatory Visit: Payer: Self-pay

## 2024-01-06 DIAGNOSIS — I872 Venous insufficiency (chronic) (peripheral): Secondary | ICD-10-CM

## 2024-01-13 ENCOUNTER — Ambulatory Visit: Payer: Medicare HMO | Admitting: Physician Assistant

## 2024-01-13 ENCOUNTER — Ambulatory Visit (HOSPITAL_COMMUNITY)
Admission: RE | Admit: 2024-01-13 | Discharge: 2024-01-13 | Disposition: A | Discharge status: Home / Self Care | Payer: Medicare HMO | Source: Ambulatory Visit | Attending: Vascular Surgery | Admitting: Vascular Surgery

## 2024-01-13 VITALS — BP 135/80 | HR 65 | Temp 98.0°F | Resp 18 | Ht 69.0 in | Wt 243.8 lb

## 2024-01-13 DIAGNOSIS — I872 Venous insufficiency (chronic) (peripheral): Secondary | ICD-10-CM

## 2024-01-13 DIAGNOSIS — E1165 Type 2 diabetes mellitus with hyperglycemia: Secondary | ICD-10-CM | POA: Diagnosis not present

## 2024-01-13 DIAGNOSIS — M7989 Other specified soft tissue disorders: Secondary | ICD-10-CM

## 2024-01-13 NOTE — Progress Notes (Addendum)
 [facility] [place]   Reason for referral: Swollen right LE> left  leg  History of Present Illness  [name] is a 78 y.o. male who presents with chief complaint: swollen leg.  Patient notes, onset of swelling > 5 months ago, associated with prolonged sitting and standing.  The patient has had no history of DVT, no history of varicose vein, positive history of venous stasis ulcers, no history of  Lymphedema and positive history of skin changes in lower legs.  There is unknown family history of venous disorders.  The patient has tried used compression stockings in the past.  He has been seen in the past at the wound care center for the right LE skin changes with cracking and weeping.  No open wound currently.     He speaks [place] and his daughter is with him to interpret.  His past medical history includes Hydronephrosis, diffuse non-Hodgkin lymphoma, Anemia, HTN, OSA, CAD s/p PCI with DES to RCA 11/2020 on Plavix, PAF on Eliquis and amiodarone, NSVT.  He does exercise daily and walks at least a mile daily.     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 per dr Zenaida Niece dam note 06-18-2021   Hematuria 06/18/2021   Hypertension    Sleep apnea    uses cpap   Uses walker 07/24/2021   prn or cane   UTI (urinary tract infection)    finished antibiotics on 07-08-2021   Wound of skin 10/20/2021   sees wound care center for multiple dressing changes    Past Surgical History:  Procedure Laterality Date   CARDIAC CATHETERIZATION     CYSTOSCOPY W/ URETERAL STENT PLACEMENT Right 07/28/2021   Procedure: CYSTOSCOPY WITH RETROGRADE PYELOGRAM/URETERAL STENT EXCHANGE;   Surgeon: Belva Agee, MD;  Location: [facility];  Service: Urology;  Laterality: Right;  30 MINS   CYSTOSCOPY W/ URETERAL STENT PLACEMENT Right 10/27/2021   Procedure: CYSTOSCOPY WITH RETROGRADE PYELOGRAM/ URETERAL STENT REMOVAL;  Surgeon: Belva Agee, MD;  Location[facility];  Service: Urology;  Laterality: Right;   CYSTOSCOPY WITH RETROGRADE PYELOGRAM, URETEROSCOPY AND STENT PLACEMENT Right 05/01/2021   Procedure: CYSTOSCOPY WITH RIGHT  RETROGRADE PYELOGRAM,  AND RIGHT STENT PLACEMENT;  Surgeon: Jerilee Field, MD;  Location: [facility];  Service: Urology;  Laterality: Right;   EYE SURGERY     INJECTION OF SILICONE OIL Left 11/10/2020   Procedure: INJECTION OF SILICONE OIL;  Surgeon: Stephannie Li, MD;  Location: [facility] OR;  Service: Ophthalmology;  Laterality: Left;   INJECTION OF SILICONE OIL Left 12/25/2020   Procedure: INJECTION OF SILICONE OIL;  Surgeon: Stephannie Li, MD;  Location: [facility] OR;  Service: Ophthalmology;  Laterality: Left;   JOINT REPLACEMENT Right    hip   LASER PHOTO ABLATION Left 12/25/2020   Procedure: LASER PHOTO ABLATION;  Surgeon: Stephannie Li, MD;  Location: [facility] OR;  Service: Ophthalmology;  Laterality: Left;   PARS PLANA VITRECTOMY Left 11/10/2020   Procedure: PARS PLANA VITRECTOMY WITH 25 GAUGE;  Surgeon: Stephannie Li, MD;  Location: [facility] OR;  Service: Ophthalmology;  Laterality: Left;  PARS PLANA VITRECTOMY Left 12/25/2020   Procedure: PARS PLANA VITRECTOMY WITH 25 GAUGE IN LEFT EYE;  Surgeon: Stephannie Li, MD;  Location: [facility] OR;  Service: Ophthalmology;  Laterality: Left;   PERFLUORONE INJECTION Left 12/25/2020   Procedure: PERFLUORONE INJECTION;  Surgeon: Stephannie Li, MD;  Location: [facility] OR;  Service: Ophthalmology;  Laterality: Left;   PHOTOCOAGULATION WITH LASER Left 11/10/2020   Procedure: PHOTOCOAGULATION WITH LASER;  Surgeon: Stephannie Li, MD;  Location: [facility] OR;  Service: Ophthalmology;  Laterality: Left;    removal of picc line     09-2021   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   Procedure: RETINECTOMY LEFT EYE;  Surgeon: Stephannie Li, MD;  Location: [facility] OR;  Service: Ophthalmology;  Laterality: Left;   SILICON OIL REMOVAL Left 12/25/2020   Procedure: SILICONE OIL REMOVAL;  Surgeon: Stephannie Li, MD;  Location: [facility] OR;  Service: Ophthalmology;  Laterality: Left;   TEE WITHOUT CARDIOVERSION N/A 05/29/2021   Procedure: TRANSESOPHAGEAL ECHOCARDIOGRAM (TEE);  Surgeon: Christell Constant, MD;  Location: [facility] ENDOSCOPY;  Service: Cardiovascular;  Laterality: N/A;    Social History   Socioeconomic History   Marital status: Married    Spouse name: Not on file   Number of children: Not on file   Years of education: Not on file   Highest education level: Not on file  Occupational History   Not on file  Tobacco Use   Smoking status: Never    Passive exposure: Never   Smokeless tobacco: Never  Vaping Use   Vaping status: Never Used  Substance and Sexual Activity   Alcohol use: Not Currently   Drug use: Never   Sexual activity: Yes  Other Topics Concern   Not on file  Social History Narrative   Not on file   Social Drivers of Health   Financial Resource Strain: Low Risk  (07/26/2023)   Overall Financial Resource Strain (CARDIA)    Difficulty of Paying Living Expenses: Not hard at all  Food Insecurity: No Food Insecurity (07/26/2023)   Hunger Vital Sign    Worried About Running Out of Food in the Last Year: Never true    Ran Out of Food in the Last Year: Never true  Transportation Needs: No Transportation Needs (07/26/2023)   PRAPARE - Administrator, Civil Service (Medical): No    Lack of Transportation (Non-Medical): No  Physical Activity: Sufficiently Active (07/26/2023)   Exercise Vital Sign    Days of Exercise per Week: 5 days    Minutes of Exercise per Session: 30 min  Stress: No Stress Concern Present (07/26/2023)   [employer] of  Occupational Health - Occupational Stress Questionnaire    Feeling of Stress : Not at all  Social Connections: Moderately Isolated (07/26/2023)   Social Connection and Isolation Panel [NHANES]    Frequency of Communication with Friends and Family: More than three times a week    Frequency of Social Gatherings with Friends and Family: Twice a week    Attends Religious Services: Never    Database administrator or Organizations: No    Attends Banker Meetings: Never    Marital Status: Married  Catering manager Violence: Not At Risk (07/26/2023)   Humiliation, Afraid, Rape, and Kick questionnaire    Fear of Current or Ex-Partner: No    Emotionally Abused: No    Physically Abused: No    Sexually Abused: No    Family History  Problem Relation Age of Onset  CAD Other     Current Outpatient Medications on File Prior to Visit  Medication Sig Dispense Refill   amiodarone (PACERONE) 200 MG tablet Take 1 tablet (200 mg total) by mouth daily. 90 tablet 3   amLODipine (NORVASC) 10 MG tablet Take 1 tablet (10 mg total) by mouth daily. 90 tablet 3   apixaban (ELIQUIS) 5 MG TABS tablet Take 1 tablet (5 mg total) by mouth 2 (two) times daily. 180 tablet 1   ferrous sulfate 325 (65 FE) MG tablet Take 1 tablet (325 mg total) by mouth daily with breakfast. Please take with a source of Vitamin C 90 tablet 3   finasteride (PROSCAR) 5 MG tablet Take 1 tablet (5 mg total) by mouth at bedtime. 90 tablet 0   Insulin Pen Needle (NOVOFINE) 30G X 8 MM MISC Inject 10 each into the skin as needed. 100 each 6   LANTUS SOLOSTAR 100 UNIT/ML Solostar Pen INJECT 34 UNITS SUBCUTANEOUSLY AT BEDTIME 15 mL 0   metFORMIN (GLUCOPHAGE) 1000 MG tablet Take 1 tablet (1,000 mg total) by mouth 2 (two) times daily with a meal. 180 tablet 1   MOUNJARO 2.5 MG/0.5ML Pen Inject 2.5 mg into the skin once a week.     nitroGLYCERIN (NITROSTAT) 0.3 MG SL tablet Place 0.3 mg under the tongue every 5 (five) minutes x 3 doses as  needed for chest pain.     rosuvastatin (CRESTOR) 10 MG tablet Take 1 tablet (10 mg total) by mouth daily. 90 tablet 3   torsemide (DEMADEX) 20 MG tablet Take 2 tablets (40 mg total) by mouth daily. 180 tablet 3   isosorbide mononitrate (IMDUR) 30 MG 24 hr tablet Take 30 mg by mouth every morning. (Patient not taking: Reported on 11/25/2023)     metoprolol tartrate (LOPRESSOR) 50 MG tablet Take 1 tablet (50 mg total) by mouth once for 1 dose. TAKE 1 TABLET BY MOUTH TWICE DAILY . APPOINTMENT REQUIRED FOR FUTURE REFILLS. CALL [phone] TO SCHEDULE APPOINTMENT. 180 tablet 3   No current facility-administered medications on file prior to visit.    Allergies as of 01/13/2024   (No Known Allergies)     ROS:   General:  No weight loss, Fever, chills  HEENT: No recent headaches, no nasal bleeding, no visual changes, no sore throat  Neurologic: No dizziness, blackouts, seizures. No recent symptoms of stroke or mini- stroke. No recent episodes of slurred speech, or temporary blindness.  Cardiac: No recent episodes of chest pain/pressure, no shortness of breath at rest.  No shortness of breath with exertion.  Denies history of atrial fibrillation or irregular heartbeat  Vascular: No history of rest pain in feet.  No history of claudication.  No history of non-healing ulcer, No history of DVT   Pulmonary: No home oxygen, no productive cough, no hemoptysis,  No asthma or wheezing  Musculoskeletal:  [ ]  Arthritis, [ ]  Low back pain,  [ ]  Joint pain  Hematologic:No history of hypercoagulable state.  No history of easy bleeding.  No history of anemia  Gastrointestinal: No hematochezia or melena,  No gastroesophageal reflux, no trouble swallowing  Urinary: [ ]  chronic Kidney disease, [ ]  on HD - [ ]  MWF or [ ]  TTHS, [ ]  Burning with urination, [ ]  Frequent urination, [ ]  Difficulty urinating;   Skin: No rashes  Psychological: No history of anxiety,  No history of depression  Physical  Examination  Vitals:   01/13/24 1034  BP: 135/80  Pulse: 65  Resp: 18  Temp: 98 F (36.7 C)  TempSrc: Temporal  SpO2: 95%  Weight: 243 lb 12.8 oz (110.6 kg)  Height: 5\' 9"  (1.753 m)    Body mass index is 36 kg/m.  General:  Alert and oriented, no acute distress HEENT: Normal Neck: No bruit or JVD Pulmonary: Clear to auscultation bilaterally Cardiac: Regular Rate and Rhythm without murmur Abdomen: Soft, non-tender, non-distended, no mass, no scars Skin: No rash Extremity Pulses:   radial,  femoral, dorsalis pedis,  pulses bilaterally Musculoskeletal: No deformity or edema  Neurologic: Upper and lower extremity motor 5/5 and symmetric  DATA: Venous Reflux Times  +----------------------------+---------+------+-----------+-----------+----  ----+  RIGHT                      Reflux NoRefluxReflux Time Diameter   Comments                                       Yes                 cms               +----------------------------+---------+------+-----------+-----------+----  ----+  CFV                                  yes   >1 second                       +----------------------------+---------+------+-----------+-----------+----  ----+  GSV at SFJ                                   >500 ms     0.64               +----------------------------+---------+------+-----------+-----------+----  ----+  GSV prox thigh              no                           0.44               +----------------------------+---------+------+-----------+-----------+----  ----+  GSV mid thigh               no                           0.48               +----------------------------+---------+------+-----------+-----------+----  ----+  GSV dist thigh                        yes    >500 ms     0.50               +----------------------------+---------+------+-----------+-----------+----  ----+  GSV at knee                           yes    >500  ms     0.45               +----------------------------+---------+------+-----------+-----------+----  ----+  GSV prox calf  yes    >500 ms     0.41               +----------------------------+---------+------+-----------+-----------+----  ----+  GSV mid calf                          yes    >500 ms     0.35               +----------------------------+---------+------+-----------+-----------+----  ----+  SSV Pop Fossa               no                           0.30               +----------------------------+---------+------+-----------+-----------+----  ----+  SSV prox calf               no                           0.28               +----------------------------+---------+------+-----------+-----------+----  ----+  SSV mid calf                no                           0.27               +----------------------------+---------+------+-----------+-----------+----  ----+  Medial Calf Varicosities              yes    >500 ms     0.41               +----------------------------+---------+------+-----------+-----------+----  ----+  Medial Mid/Dist Calf                  yes    >500 ms     0.50               Perforator                                                                  +----------------------------+---------+------+-----------+-----------+----  ----+     Summary:  Right:  - No evidence of deep vein thrombosis seen in the right lower extremity,  from the common femoral through the popliteal veins.  - No evidence of superficial venous thrombosis in the right lower  extremity.  - Venous reflux is noted in the right common femoral vein.  - Venous reflux is noted in the right sapheno-femoral junction.  - Venous reflux is noted in the right greater saphenous vein in the thigh.  - Venous reflux is noted in the right greater saphenous vein in the calf.  - Venous reflux is noted in the  right perforator vein.      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  He states his swelling is better lately that it has been.  He was given knee high compression in the past was not able to wear them at the time due to the edema.  The venous duplex shows SFJ reflux with GSV reflux and the vein size is < 0.4.  With his history of skin changes and wounds he will be considered for laser ablation therapy.    He will be fitted for thigh high compression, elevation in the supine position multiple times a day and cont. Exercise.  He will f/u in 3 months for a vein clinic visit with the MD to discuss his possible intervention and progress with the conservative therapy.  He has palpable pedal pulses and is not at risk of limb loss.     Mosetta Pigeon PA-C [facility] [place] Office: [phone]  MD in the clinic [place]

## 2024-01-19 DIAGNOSIS — M439 Deforming dorsopathy, unspecified: Secondary | ICD-10-CM | POA: Diagnosis not present

## 2024-01-19 DIAGNOSIS — E1165 Type 2 diabetes mellitus with hyperglycemia: Secondary | ICD-10-CM | POA: Diagnosis not present

## 2024-01-19 DIAGNOSIS — I10 Essential (primary) hypertension: Secondary | ICD-10-CM | POA: Diagnosis not present

## 2024-01-19 DIAGNOSIS — E1142 Type 2 diabetes mellitus with diabetic polyneuropathy: Secondary | ICD-10-CM | POA: Diagnosis not present

## 2024-01-19 DIAGNOSIS — E7800 Pure hypercholesterolemia, unspecified: Secondary | ICD-10-CM | POA: Diagnosis not present

## 2024-01-19 DIAGNOSIS — E113499 Type 2 diabetes mellitus with severe nonproliferative diabetic retinopathy without macular edema, unspecified eye: Secondary | ICD-10-CM | POA: Diagnosis not present

## 2024-01-20 DIAGNOSIS — I5032 Chronic diastolic (congestive) heart failure: Secondary | ICD-10-CM | POA: Diagnosis not present

## 2024-01-20 DIAGNOSIS — I2510 Atherosclerotic heart disease of native coronary artery without angina pectoris: Secondary | ICD-10-CM | POA: Diagnosis not present

## 2024-01-20 DIAGNOSIS — E1159 Type 2 diabetes mellitus with other circulatory complications: Secondary | ICD-10-CM | POA: Diagnosis not present

## 2024-01-20 DIAGNOSIS — I350 Nonrheumatic aortic (valve) stenosis: Secondary | ICD-10-CM | POA: Diagnosis not present

## 2024-01-20 DIAGNOSIS — I152 Hypertension secondary to endocrine disorders: Secondary | ICD-10-CM | POA: Diagnosis not present

## 2024-01-20 DIAGNOSIS — I480 Paroxysmal atrial fibrillation: Secondary | ICD-10-CM | POA: Diagnosis not present

## 2024-01-20 DIAGNOSIS — I7121 Aneurysm of the ascending aorta, without rupture: Secondary | ICD-10-CM | POA: Diagnosis not present

## 2024-01-21 LAB — HEPATIC FUNCTION PANEL
ALT: 38 IU/L (ref 0–44)
AST: 41 IU/L — ABNORMAL HIGH (ref 0–40)
Albumin: 4 g/dL (ref 3.8–4.8)
Alkaline Phosphatase: 90 IU/L (ref 44–121)
Bilirubin Total: 0.6 mg/dL (ref 0.0–1.2)
Bilirubin, Direct: 0.26 mg/dL (ref 0.00–0.40)
Total Protein: 6 g/dL (ref 6.0–8.5)

## 2024-01-21 LAB — LIPID PANEL
Chol/HDL Ratio: 2.9 ratio (ref 0.0–5.0)
Cholesterol, Total: 114 mg/dL (ref 100–199)
HDL: 40 mg/dL (ref >39)
LDL Chol Calc (NIH): 53 mg/dL (ref 0–99)
Triglycerides: 114 mg/dL (ref 0–149)
VLDL Cholesterol Cal: 21 mg/dL (ref 5–40)

## 2024-01-22 DIAGNOSIS — I2510 Atherosclerotic heart disease of native coronary artery without angina pectoris: Secondary | ICD-10-CM

## 2024-01-22 DIAGNOSIS — I480 Paroxysmal atrial fibrillation: Secondary | ICD-10-CM

## 2024-01-22 DIAGNOSIS — Z79899 Other long term (current) drug therapy: Secondary | ICD-10-CM

## 2024-01-24 ENCOUNTER — Ambulatory Visit (HOSPITAL_BASED_OUTPATIENT_CLINIC_OR_DEPARTMENT_OTHER): Payer: Medicare HMO | Admitting: *Deleted

## 2024-01-24 ENCOUNTER — Encounter (HOSPITAL_BASED_OUTPATIENT_CLINIC_OR_DEPARTMENT_OTHER): Payer: Self-pay

## 2024-01-24 DIAGNOSIS — Z0000 Encounter for general adult medical examination without abnormal findings: Secondary | ICD-10-CM

## 2024-01-24 NOTE — Progress Notes (Signed)
 Subjective:   [name] is a 78 y.o. male who presents for Medicare Annual/Subsequent preventive examination.  Visit Complete: Virtual I connected with  [facility]  patient on phone Daughter interpreted for patient on 01/24/24 by a audio enabled telemedicine application and verified that I am speaking with the correct person using two identifiers.  Patient Location: Home  Provider Location: Home Office  I discussed the limitations of evaluation and management by telemedicine. The patient expressed understanding and agreed to proceed.  Vital Signs: Because this visit was a virtual/telehealth visit, some criteria may be missing or patient reported. Any vitals not documented were not able to be obtained and vitals that have been documented are patient reported.   Cardiac Risk Factors include: advanced age (>61men, >61 women);diabetes mellitus;male gender;hypertension     Objective:    There were no vitals filed for this visit. There is no height or weight on file to calculate BMI.     01/24/2024    9:07 AM 01/18/2023    9:46 AM 11/16/2022   11:13 AM 09/09/2022   12:16 PM 01/29/2022    6:57 PM 10/27/2021    9:40 AM 09/03/2021    1:41 PM  Advanced Directives  Does Patient Have a Medical Advance Directive? No No No No No No No  Would patient like information on creating a medical advance directive? No - Patient declined No - Patient declined No - Patient declined  No - Patient declined No - Patient declined No - Patient declined    Current Medications (verified) Outpatient Encounter Medications as of 01/24/2024  Medication Sig   amiodarone (PACERONE) 200 MG tablet Take 1 tablet (200 mg total) by mouth daily.   amLODipine (NORVASC) 10 MG tablet Take 1 tablet (10 mg total) by mouth daily.   apixaban (ELIQUIS) 5 MG TABS tablet Take 1 tablet (5 mg total) by mouth 2 (two) times daily.   ferrous sulfate 325 (65 FE) MG tablet Take 1 tablet (325 mg total) by mouth daily with  breakfast. Please take with a source of Vitamin C   finasteride (PROSCAR) 5 MG tablet Take 1 tablet (5 mg total) by mouth at bedtime.   Insulin Pen Needle (NOVOFINE) 30G X 8 MM MISC Inject 10 each into the skin as needed.   isosorbide mononitrate (IMDUR) 30 MG 24 hr tablet Take 30 mg by mouth every morning. (Patient not taking: Reported on 11/25/2023)   LANTUS SOLOSTAR 100 UNIT/ML Solostar Pen INJECT 34 UNITS SUBCUTANEOUSLY AT BEDTIME   metFORMIN (GLUCOPHAGE) 1000 MG tablet Take 1 tablet (1,000 mg total) by mouth 2 (two) times daily with a meal.   metoprolol tartrate (LOPRESSOR) 50 MG tablet Take 1 tablet (50 mg total) by mouth once for 1 dose. TAKE 1 TABLET BY MOUTH TWICE DAILY . APPOINTMENT REQUIRED FOR FUTURE REFILLS. CALL [phone] TO SCHEDULE APPOINTMENT.   MOUNJARO 2.5 MG/0.5ML Pen Inject 2.5 mg into the skin once a week.   nitroGLYCERIN (NITROSTAT) 0.3 MG SL tablet Place 0.3 mg under the tongue every 5 (five) minutes x 3 doses as needed for chest pain.   rosuvastatin (CRESTOR) 10 MG tablet Take 1 tablet (10 mg total) by mouth daily.   torsemide (DEMADEX) 20 MG tablet Take 2 tablets (40 mg total) by mouth daily.   No facility-administered encounter medications on file as of 01/24/2024.    Allergies (verified) Patient has no known allergies.   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 per dr Zenaida Niece dam note 06-18-2021   Hematuria 06/18/2021   Hypertension    Sleep apnea    uses cpap   Uses walker 07/24/2021   prn or cane   UTI (urinary tract infection)    finished antibiotics on 07-08-2021   Wound of skin 10/20/2021   sees wound care center for multiple  dressing changes   Past Surgical History:  Procedure Laterality Date   CARDIAC CATHETERIZATION     CYSTOSCOPY W/ URETERAL STENT PLACEMENT Right 07/28/2021   Procedure: CYSTOSCOPY WITH RETROGRADE PYELOGRAM/URETERAL STENT EXCHANGE;  Surgeon: Belva Agee, MD;  Location: [facility];  Service: Urology;  Laterality: Right;  30 MINS   CYSTOSCOPY W/ URETERAL STENT PLACEMENT Right 10/27/2021   Procedure: CYSTOSCOPY WITH RETROGRADE PYELOGRAM/ URETERAL STENT REMOVAL;  Surgeon: Belva Agee, MD;  Location: [facility];  Service: Urology;  Laterality: Right;   CYSTOSCOPY WITH RETROGRADE PYELOGRAM, URETEROSCOPY AND STENT PLACEMENT Right 05/01/2021   Procedure: CYSTOSCOPY WITH RIGHT  RETROGRADE PYELOGRAM,  AND RIGHT STENT PLACEMENT;  Surgeon: Jerilee Field, MD;  Location: [facility];  Service: Urology;  Laterality: Right;   EYE SURGERY     INJECTION OF SILICONE OIL Left 11/10/2020   Procedure: INJECTION OF SILICONE OIL;  Surgeon: Stephannie Li, MD;  Location: [facility] OR;  Service: Ophthalmology;  Laterality: Left;   INJECTION OF SILICONE OIL Left 12/25/2020   Procedure: INJECTION OF SILICONE OIL;  Surgeon: Stephannie Li, MD;  Location: [facility] OR;  Service: Ophthalmology;  Laterality: Left;   JOINT REPLACEMENT Right    hip   LASER PHOTO ABLATION Left 12/25/2020   Procedure: LASER PHOTO ABLATION;  Surgeon: Stephannie Li, MD;  Location: [facility] OR;  Service: Ophthalmology;  Laterality: Left;   PARS PLANA VITRECTOMY Left 11/10/2020   Procedure: PARS PLANA VITRECTOMY WITH 25 GAUGE;  Surgeon: Stephannie Li, MD;  Location: [facility] OR;  Service: Ophthalmology;  Laterality: Left;   PARS PLANA VITRECTOMY Left 12/25/2020   Procedure: PARS PLANA VITRECTOMY WITH 25 GAUGE IN LEFT EYE;  Surgeon: Stephannie Li, MD;  Location: [facility] OR;  Service: Ophthalmology;  Laterality: Left;   PERFLUORONE INJECTION Left 12/25/2020   Procedure: PERFLUORONE INJECTION;  Surgeon: Stephannie Li, MD;  Location[facility]  OR;  Service: Ophthalmology;  Laterality: Left;   PHOTOCOAGULATION WITH LASER Left 11/10/2020   Procedure: PHOTOCOAGULATION WITH LASER;  Surgeon: Stephannie Li, MD;  Location: [facility] OR;  Service: Ophthalmology;  Laterality: Left;   removal of picc line     09-2021   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   Procedure: RETINECTOMY LEFT EYE;  Surgeon: Stephannie Li, MD;  Location: [facility] OR;  Service: Ophthalmology;  Laterality: Left;   SILICON OIL REMOVAL Left 12/25/2020   Procedure: SILICONE OIL REMOVAL;  Surgeon: Stephannie Li, MD;  Location: [facility] OR;  Service: Ophthalmology;  Laterality: Left;   TEE WITHOUT CARDIOVERSION N/A 05/29/2021   Procedure: TRANSESOPHAGEAL ECHOCARDIOGRAM (TEE);  Surgeon: Christell Constant, MD;  Location: [facility] ENDOSCOPY;  Service: Cardiovascular;  Laterality: N/A;   Family History  Problem Relation Age of Onset   CAD Other    Social History   Socioeconomic History  Marital status: Married    Spouse name: Not on file   Number of children: Not on file   Years of education: Not on file   Highest education level: Not on file  Occupational History   Not on file  Tobacco Use   Smoking status: Never    Passive exposure: Never   Smokeless tobacco: Never  Vaping Use   Vaping status: Never Used  Substance and Sexual Activity   Alcohol use: Not Currently   Drug use: Never   Sexual activity: Yes  Other Topics Concern   Not on file  Social History Narrative   Not on file   Social Drivers of Health   Financial Resource Strain: Low Risk  (01/24/2024)   Overall Financial Resource Strain (CARDIA)    Difficulty of Paying Living Expenses: Not hard at all  Food Insecurity: No Food Insecurity (01/24/2024)   Hunger Vital Sign    Worried About Running Out of Food in the Last Year: Never true    Ran Out of Food in the Last Year: Never true  Transportation Needs: No Transportation Needs (01/24/2024)   PRAPARE - Scientist, research (physical sciences) (Medical): No    Lack of Transportation (Non-Medical): No  Physical Activity: Sufficiently Active (01/24/2024)   Exercise Vital Sign    Days of Exercise per Week: 4 days    Minutes of Exercise per Session: 40 min  Stress: No Stress Concern Present (01/24/2024)   [employer] of Occupational Health - Occupational Stress Questionnaire    Feeling of Stress : Not at all  Social Connections: Moderately Integrated (01/24/2024)   Social Connection and Isolation Panel [NHANES]    Frequency of Communication with Friends and Family: Twice a week    Frequency of Social Gatherings with Friends and Family: Three times a week    Attends Religious Services: More than 4 times per year    Active Member of Clubs or Organizations: No    Attends Banker Meetings: Never    Marital Status: Married    Tobacco Counseling Counseling given: Not Answered   Clinical Intake:  Pre-visit preparation completed: Yes  Pain : No/denies pain     Diabetes: Yes CBG done?: No Did pt. bring in CBG monitor from home?: No  How often do you need to have someone help you when you read instructions, pamphlets, or other written materials from your doctor or pharmacy?: 1 - Never  Interpreter Needed?: No  Information entered by :: [name] LPN   Activities of Daily Living    01/24/2024    9:12 AM  In your present state of health, do you have any difficulty performing the following activities:  Hearing? 1  Vision? 0  Difficulty concentrating or making decisions? 0  Walking or climbing stairs? 1  Dressing or bathing? 0  Doing errands, shopping? 1  Preparing Food and eating ? N  Using the Toilet? N  In the past six months, have you accidently leaked urine? N  Do you have problems with loss of bowel control? N  Managing your Medications? N  Managing your Finances? N  Housekeeping or managing your Housekeeping? N    Patient Care Team: [place], Buren Kos, MD as PCP - General  (Family Medicine) Pricilla Riffle, MD as PCP - Cardiology (Cardiology) Verl Bangs, MD (Family Medicine)  Indicate any recent Medical Services you may have received from other than [facility] providers in the past year (date may be approximate).  Assessment:   This is a routine wellness examination for [place].  Hearing/Vision screen Hearing Screening - Comments:: Bilateral hearing aids Vision Screening - Comments:: Up to date Unsure of name   Goals Addressed             This Visit's Progress    Patient Stated       Continue current lifestyle       Depression Screen    01/24/2024    9:15 AM 10/25/2023    9:39 AM 07/26/2023   10:08 AM 07/26/2023   10:06 AM 04/22/2023    9:28 AM 01/20/2023    2:40 PM 01/18/2023    9:39 AM  PHQ 2/9 Scores  PHQ - 2 Score 0 0 0 0 0 0 0  PHQ- 9 Score 0 0 0  3 0 0  Exception Documentation     Medical reason Medical reason     Fall Risk    01/24/2024    9:06 AM 10/25/2023    9:39 AM 07/26/2023   10:08 AM 04/22/2023    9:28 AM 01/20/2023    2:40 PM  Fall Risk   Falls in the past year? 0 1 0 1 0  Number falls in past yr: 0 0 0 1 0  Injury with Fall? 0 1 0 0 0  Risk for fall due to :  History of fall(s);Impaired balance/gait No Fall Risks History of fall(s) No Fall Risks  Follow up Falls evaluation completed;Education provided;Falls prevention discussed Falls evaluation completed;Education provided;Falls prevention discussed Falls evaluation completed Falls evaluation completed Falls evaluation completed    MEDICARE RISK AT HOME: Medicare Risk at Home Any stairs in or around the home?: Yes If so, are there any without handrails?: No Home free of loose throw rugs in walkways, pet beds, electrical cords, etc?: Yes Adequate lighting in your home to reduce risk of falls?: Yes Life alert?: No Use of a cane, walker or w/c?: Yes Grab bars in the bathroom?: Yes Shower chair or bench in shower?: Yes Elevated toilet seat or a handicapped  toilet?: No  TIMED UP AND GO:  Was the test performed?  No    Cognitive Function:        01/24/2024    9:09 AM 01/18/2023    9:46 AM  6CIT Screen  What Year? 0 points 0 points  What month? 0 points 0 points  What time? 0 points 0 points  Count back from 20 0 points 0 points  Months in reverse 0 points 2 points  Repeat phrase 0 points 4 points  Total Score 0 points 6 points    Immunizations Immunization History  Administered Date(s) Administered   Fluad Quad(high Dose 65+) 12/04/2021   PNEUMOCOCCAL CONJUGATE-20 12/04/2021   Tdap 04/16/2019    TDAP status: Up to date  Flu Vaccine status: Up to date  Pneumococcal vaccine status: Up to date  Covid-19 vaccine status: Information provided on how to obtain vaccines.   Qualifies for Shingles Vaccine? Yes   Zostavax completed No   Shingrix Completed?: No.    Education has been provided regarding the importance of this vaccine. Patient has been advised to call insurance company to determine out of pocket expense if they have not yet received this vaccine. Advised may also receive vaccine at local pharmacy or Health Dept. Verbalized acceptance and understanding.  Screening Tests Health Maintenance  Topic Date Due   COVID-19 Vaccine (1) Never done   Hepatitis C Screening  Never done   Zoster  Vaccines- Shingrix (1 of 2) Never done   Diabetic kidney evaluation - Urine ACR  11/25/2023   HEMOGLOBIN A1C  01/23/2024   INFLUENZA VACCINE  02/13/2024 (Originally 06/16/2023)   OPHTHALMOLOGY EXAM  01/26/2024   FOOT EXAM  04/21/2024   Diabetic kidney evaluation - eGFR measurement  09/08/2024   Medicare Annual Wellness (AWV)  01/23/2025   DTaP/Tdap/Td (2 - Td or Tdap) 04/15/2029   Pneumonia Vaccine 85+ Years old  Completed   HPV VACCINES  Aged Out    Health Maintenance  Health Maintenance Due  Topic Date Due   COVID-19 Vaccine (1) Never done   Hepatitis C Screening  Never done   Zoster Vaccines- Shingrix (1 of 2) Never done    Diabetic kidney evaluation - Urine ACR  11/25/2023   HEMOGLOBIN A1C  01/23/2024    Colorectal cancer screening: No longer required.   Lung Cancer Screening: (Low Dose CT Chest recommended if Age 61-80 years, 20 pack-year currently smoking OR have quit w/in 15years.) does not qualify.   Lung Cancer Screening Referral:   Additional Screening:  Hepatitis C Screening  never done  Vision Screening: Recommended annual ophthalmology exams for early detection of glaucoma and other disorders of the eye. Is the patient up to date with their annual eye exam?  Yes  Who is the provider or what is the name of the office in which the patient attends annual eye exams? Unsure of name If pt is not established with a provider, would they like to be referred to a provider to establish care? No .   Dental Screening: Recommended annual dental exams for proper oral hygiene  Nutrition Risk Assessment:  Has the patient had any N/V/D within the last 2 months?  No  Does the patient have any non-healing wounds?  No  Has the patient had any unintentional weight loss or weight gain?  No   Diabetes:  Is the patient diabetic?  Yes  If diabetic, was a CBG obtained today?  No  Did the patient bring in their glucometer from home?  No  How often do you monitor your CBG's? 1 x a day.   Financial Strains and Diabetes Management:  Are you having any financial strains with the device, your supplies or your medication? No .  Does the patient want to be seen by Chronic Care Management for management of their diabetes?  No  Would the patient like to be referred to a Nutritionist or for Diabetic Management?  No   Diabetic Exams:  Diabetic Eye Exam: Completed . Pt has been advised about the importance in completing this exam.  Diabetic Foot Exam: . Pt has been advised about the importance in completing this exam.    Community Resource Referral / Chronic Care Management: CRR required this visit?  No   CCM  required this visit?  No     Plan:     I have personally reviewed and noted the following in the patient's chart:   Medical and social history Use of alcohol, tobacco or illicit drugs  Current medications and supplements including opioid prescriptions. Patient is not currently taking opioid prescriptions. Functional ability and status Nutritional status Physical activity Advanced directives List of other physicians Hospitalizations, surgeries, and ER visits in previous 12 months Vitals Screenings to include cognitive, depression, and falls Referrals and appointments  In addition, I have reviewed and discussed with patient certain preventive protocols, quality metrics, and best practice recommendations. A written personalized care plan for preventive services  as well as general preventive health recommendations were provided to patient.     [name], LPN   [id number]   After Visit Summary: (MyChart) Due to this being a telephonic visit, the after visit summary with patients personalized plan was offered to patient via MyChart   Nurse Notes:

## 2024-01-24 NOTE — Patient Instructions (Signed)
 Mr. [name] , Thank you for taking time to come for your Medicare Wellness Visit. I appreciate your ongoing commitment to your health goals. Please review the following plan we discussed and let me know if I can assist you in the future.   Screening recommendations/referrals:  Recommended yearly ophthalmology/optometry visit for glaucoma screening and checkup Recommended yearly dental visit for hygiene and checkup  Vaccinations: Influenza vaccine: Pneumococcal vaccine:  Tdap vaccine:  Shingles vaccine:      Preventive Care 65 Years and Older, Male Preventive care refers to lifestyle choices and visits with your health care provider that can promote health and wellness. What does preventive care include? A yearly physical exam. This is also called an annual well check. Dental exams once or twice a year. Routine eye exams. Ask your health care provider how often you should have your eyes checked. Personal lifestyle choices, including: Daily care of your teeth and gums. Regular physical activity. Eating a healthy diet. Avoiding tobacco and drug use. Limiting alcohol use. Practicing safe sex. Taking low doses of aspirin every day. Taking vitamin and mineral supplements as recommended by your health care provider. What happens during an annual well check? The services and screenings done by your health care provider during your annual well check will depend on your age, overall health, lifestyle risk factors, and family history of disease. Counseling  Your health care provider may ask you questions about your: Alcohol use. Tobacco use. Drug use. Emotional well-being. Home and relationship well-being. Sexual activity. Eating habits. History of falls. Memory and ability to understand (cognition). Work and work Astronomer. Screening  You may have the following tests or measurements: Height, weight, and BMI. Blood pressure. Lipid and cholesterol levels. These may be checked  every 5 years, or more frequently if you are over 42 years old. Skin check. Lung cancer screening. You may have this screening every year starting at age 24 if you have a 30-pack-year history of smoking and currently smoke or have quit within the past 15 years. Fecal occult blood test (FOBT) of the stool. You may have this test every year starting at age 82. Flexible sigmoidoscopy or colonoscopy. You may have a sigmoidoscopy every 5 years or a colonoscopy every 10 years starting at age 5. Prostate cancer screening. Recommendations will vary depending on your family history and other risks. Hepatitis C blood test. Hepatitis B blood test. Sexually transmitted disease (STD) testing. Diabetes screening. This is done by checking your blood sugar (glucose) after you have not eaten for a while (fasting). You may have this done every 1-3 years. Abdominal aortic aneurysm (AAA) screening. You may need this if you are a current or former smoker. Osteoporosis. You may be screened starting at age 75 if you are at high risk. Talk with your health care provider about your test results, treatment options, and if necessary, the need for more tests. Vaccines  Your health care provider may recommend certain vaccines, such as: Influenza vaccine. This is recommended every year. Tetanus, diphtheria, and acellular pertussis (Tdap, Td) vaccine. You may need a Td booster every 10 years. Zoster vaccine. You may need this after age 63. Pneumococcal 13-valent conjugate (PCV13) vaccine. One dose is recommended after age 63. Pneumococcal polysaccharide (PPSV23) vaccine. One dose is recommended after age 22. Talk to your health care provider about which screenings and vaccines you need and how often you need them. This information is not intended to replace advice given to you by your health care provider. Make sure  you discuss any questions you have with your health care provider. Document Released: 11/28/2015 Document  Revised: 07/21/2016 Document Reviewed: 09/02/2015 Elsevier Interactive Patient Education  2017 [employer].  Fall Prevention in the Home Falls can cause injuries. They can happen to people of all ages. There are many things you can do to make your home safe and to help prevent falls. What can I do on the outside of my home? Regularly fix the edges of walkways and driveways and fix any cracks. Remove anything that might make you trip as you walk through a door, such as a raised step or threshold. Trim any bushes or trees on the path to your home. Use bright outdoor lighting. Clear any walking paths of anything that might make someone trip, such as rocks or tools. Regularly check to see if handrails are loose or broken. Make sure that both sides of any steps have handrails. Any raised decks and porches should have guardrails on the edges. Have any leaves, snow, or ice cleared regularly. Use sand or salt on walking paths during winter. Clean up any spills in your garage right away. This includes oil or grease spills. What can I do in the bathroom? Use night lights. Install grab bars by the toilet and in the tub and shower. Do not use towel bars as grab bars. Use non-skid mats or decals in the tub or shower. If you need to sit down in the shower, use a plastic, non-slip stool. Keep the floor dry. Clean up any water that spills on the floor as soon as it happens. Remove soap buildup in the tub or shower regularly. Attach bath mats securely with double-sided non-slip rug tape. Do not have throw rugs and other things on the floor that can make you trip. What can I do in the bedroom? Use night lights. Make sure that you have a light by your bed that is easy to reach. Do not use any sheets or blankets that are too big for your bed. They should not hang down onto the floor. Have a firm chair that has side arms. You can use this for support while you get dressed. Do not have throw rugs and other  things on the floor that can make you trip. What can I do in the kitchen? Clean up any spills right away. Avoid walking on wet floors. Keep items that you use a lot in easy-to-reach places. If you need to reach something above you, use a strong step stool that has a grab bar. Keep electrical cords out of the way. Do not use floor polish or wax that makes floors slippery. If you must use wax, use non-skid floor wax. Do not have throw rugs and other things on the floor that can make you trip. What can I do with my stairs? Do not leave any items on the stairs. Make sure that there are handrails on both sides of the stairs and use them. Fix handrails that are broken or loose. Make sure that handrails are as long as the stairways. Check any carpeting to make sure that it is firmly attached to the stairs. Fix any carpet that is loose or worn. Avoid having throw rugs at the top or bottom of the stairs. If you do have throw rugs, attach them to the floor with carpet tape. Make sure that you have a light switch at the top of the stairs and the bottom of the stairs. If you do not have them, ask someone  to add them for you. What else can I do to help prevent falls? Wear shoes that: Do not have high heels. Have rubber bottoms. Are comfortable and fit you well. Are closed at the toe. Do not wear sandals. If you use a stepladder: Make sure that it is fully opened. Do not climb a closed stepladder. Make sure that both sides of the stepladder are locked into place. Ask someone to hold it for you, if possible. Clearly mark and make sure that you can see: Any grab bars or handrails. First and last steps. Where the edge of each step is. Use tools that help you move around (mobility aids) if they are needed. These include: Canes. Walkers. Scooters. Crutches. Turn on the lights when you go into a dark area. Replace any light bulbs as soon as they burn out. Set up your furniture so you have a clear  path. Avoid moving your furniture around. If any of your floors are uneven, fix them. If there are any pets around you, be aware of where they are. Review your medicines with your doctor. Some medicines can make you feel dizzy. This can increase your chance of falling. Ask your doctor what other things that you can do to help prevent falls. This information is not intended to replace advice given to you by your health care provider. Make sure you discuss any questions you have with your health care provider. Document Released: 08/28/2009 Document Revised: 04/08/2016 Document Reviewed: 12/06/2014 Elsevier Interactive Patient Education  2017 [employer].

## 2024-01-26 ENCOUNTER — Other Ambulatory Visit (HOSPITAL_BASED_OUTPATIENT_CLINIC_OR_DEPARTMENT_OTHER): Payer: Self-pay | Admitting: Family Medicine

## 2024-02-03 ENCOUNTER — Encounter: Payer: Self-pay | Admitting: Internal Medicine

## 2024-02-06 ENCOUNTER — Other Ambulatory Visit (HOSPITAL_BASED_OUTPATIENT_CLINIC_OR_DEPARTMENT_OTHER): Payer: Medicare HMO

## 2024-02-09 ENCOUNTER — Ambulatory Visit (HOSPITAL_BASED_OUTPATIENT_CLINIC_OR_DEPARTMENT_OTHER): Payer: Medicare HMO

## 2024-02-15 ENCOUNTER — Encounter (HOSPITAL_BASED_OUTPATIENT_CLINIC_OR_DEPARTMENT_OTHER): Payer: Self-pay | Admitting: Family Medicine

## 2024-02-15 ENCOUNTER — Ambulatory Visit (INDEPENDENT_AMBULATORY_CARE_PROVIDER_SITE_OTHER): Payer: Medicare HMO | Admitting: Family Medicine

## 2024-02-15 VITALS — BP 125/79 | HR 69 | Ht 69.0 in | Wt 240.9 lb

## 2024-02-15 DIAGNOSIS — N182 Chronic kidney disease, stage 2 (mild): Secondary | ICD-10-CM

## 2024-02-15 DIAGNOSIS — E1165 Type 2 diabetes mellitus with hyperglycemia: Secondary | ICD-10-CM | POA: Diagnosis not present

## 2024-02-15 DIAGNOSIS — Z794 Long term (current) use of insulin: Secondary | ICD-10-CM

## 2024-02-15 DIAGNOSIS — E1122 Type 2 diabetes mellitus with diabetic chronic kidney disease: Secondary | ICD-10-CM | POA: Diagnosis not present

## 2024-02-15 DIAGNOSIS — I10 Essential (primary) hypertension: Secondary | ICD-10-CM

## 2024-02-15 MED ORDER — METOPROLOL TARTRATE 50 MG PO TABS: 50.0000 mg | ORAL_TABLET | Freq: Two times a day (BID) | ORAL | 3 refills | Status: DC

## 2024-02-15 MED ORDER — METFORMIN HCL 1000 MG PO TABS: 1000.0000 mg | ORAL_TABLET | Freq: Two times a day (BID) | ORAL | 1 refills | Status: DC

## 2024-02-15 MED ORDER — FINASTERIDE 5 MG PO TABS: 5.0000 mg | ORAL_TABLET | Freq: Every day | ORAL | 0 refills | Status: DC

## 2024-02-15 MED ORDER — AMLODIPINE BESYLATE 10 MG PO TABS: 10.0000 mg | ORAL_TABLET | Freq: Every day | ORAL | 3 refills | Status: DC

## 2024-02-15 NOTE — Patient Instructions (Signed)

## 2024-02-15 NOTE — Assessment & Plan Note (Signed)
 Patient continues with insulin and metformin.  He does follow-up with endocrinologist as well.  Continues with Mounjaro as prescribed by endocrinology.  No reported issues with medications. Recommend continue close follow-up with Dr. Talmage Nap.  We can check urine microalbumin/creatinine ratio today

## 2024-02-15 NOTE — Progress Notes (Signed)
    Procedures performed today:    None.  Independent interpretation of notes and tests performed by another provider:   None.  Brief History, Exam, Impression, and Recommendations:    BP 125/79 (BP Location: Left Arm, Patient Position: Sitting, Cuff Size: Normal)   Pulse 69   Ht 5\' 9"  (1.753 m)   Wt 240 lb 14.4 oz (109.3 kg)   SpO2 97%   BMI 35.57 kg/m   Type 2 diabetes mellitus with stage 2 chronic kidney disease, with long-term current use of insulin (HCC) Assessment & Plan: Patient continues with insulin and metformin.  He does follow-up with endocrinologist as well.  Continues with Mounjaro as prescribed by endocrinology.  No reported issues with medications. Recommend continue close follow-up with Dr. Talmage Nap.  We can check urine microalbumin/creatinine ratio today  Orders: -     Microalbumin / creatinine urine ratio  Primary hypertension Assessment & Plan: Blood pressure controlled in office today.  He does continue to follow-up regularly with cardiology.  He continues with amiodarone, amlodipine, isosorbide mononitrate, metoprolol.  Requesting refill of some medications today, refilled as below.  Recommend continued follow-up with cardiology.  Recommend intermittent monitoring of blood pressure at home, DASH diet Has had slight elevation of creatinine in the past, we can proceed with recheck of this today for monitoring  Orders: -     amLODIPine Besylate; Take 1 tablet (10 mg total) by mouth daily.  Dispense: 90 tablet; Refill: 3 -     Basic metabolic panel with GFR  Other orders -     Finasteride; Take 1 tablet (5 mg total) by mouth at bedtime.  Dispense: 90 tablet; Refill: 0 -     metFORMIN HCl; Take 1 tablet (1,000 mg total) by mouth 2 (two) times daily with a meal.  Dispense: 180 tablet; Refill: 1 -     Metoprolol Tartrate; Take 1 tablet (50 mg total) by mouth 2 (two) times daily.  Dispense: 180 tablet; Refill: 3  Patient has questions about Plavix.  He reports  that his daughter was concerned that he is not currently taking Plavix due to his history.  It does appear that this question was also asked of his cardiologist and his cardiologist had discussed through the chart potential risk and benefits related to being on Plavix while currently also taking Eliquis.  Ultimately, it does appear that cardiology was going to send prescription for Plavix to the pharmacy, however I do not see this clearly in the chart.  Advised patient to reach out to cardiology office about this medication and if they are wanting patient to take medication, the can send prescription to the pharmacy for him  Return in about 4 months (around 06/16/2024) for diabetes, hypertension, med check.   ___________________________________________ Stanley Tadesse de [place], MD, ABFM, [facility]

## 2024-02-15 NOTE — Assessment & Plan Note (Signed)
 Blood pressure controlled in office today.  He does continue to follow-up regularly with cardiology.  He continues with amiodarone, amlodipine, isosorbide mononitrate, metoprolol.  Requesting refill of some medications today, refilled as below.  Recommend continued follow-up with cardiology.  Recommend intermittent monitoring of blood pressure at home, DASH diet Has had slight elevation of creatinine in the past, we can proceed with recheck of this today for monitoring

## 2024-02-16 LAB — MICROALBUMIN / CREATININE URINE RATIO
Creatinine, Urine: 46.7 mg/dL
Microalb/Creat Ratio: 63 mg/g{creat} — ABNORMAL HIGH (ref 0–29)
Microalbumin, Urine: 29.6 ug/mL

## 2024-02-16 LAB — BASIC METABOLIC PANEL WITH GFR
BUN/Creatinine Ratio: 17 (ref 10–24)
BUN: 26 mg/dL (ref 8–27)
CO2: 23 mmol/L (ref 20–29)
Calcium: 9.4 mg/dL (ref 8.6–10.2)
Chloride: 104 mmol/L (ref 96–106)
Creatinine, Ser: 1.54 mg/dL — ABNORMAL HIGH (ref 0.76–1.27)
Glucose: 149 mg/dL — ABNORMAL HIGH (ref 70–99)
Potassium: 4.2 mmol/L (ref 3.5–5.2)
Sodium: 144 mmol/L (ref 134–144)
eGFR: 46 mL/min/{1.73_m2} — ABNORMAL LOW (ref >59)

## 2024-02-17 MED ORDER — CLOPIDOGREL BISULFATE 75 MG PO TABS: 75.0000 mg | ORAL_TABLET | Freq: Every day | ORAL | 0 refills | Status: DC

## 2024-02-23 ENCOUNTER — Ambulatory Visit (HOSPITAL_BASED_OUTPATIENT_CLINIC_OR_DEPARTMENT_OTHER): Payer: Medicare HMO | Admitting: Family Medicine

## 2024-02-27 ENCOUNTER — Encounter (HOSPITAL_BASED_OUTPATIENT_CLINIC_OR_DEPARTMENT_OTHER): Payer: Self-pay | Admitting: *Deleted

## 2024-03-06 ENCOUNTER — Telehealth: Payer: Self-pay | Admitting: Hematology and Oncology

## 2024-03-06 NOTE — Telephone Encounter (Signed)
 [name] stated that [name] refused his appointments because her cannot pay for each appointment. I cancelled his appointments.

## 2024-03-08 ENCOUNTER — Encounter: Payer: Self-pay | Admitting: Radiology

## 2024-03-09 ENCOUNTER — Other Ambulatory Visit: Payer: Medicare HMO

## 2024-03-09 ENCOUNTER — Ambulatory Visit: Payer: Medicare HMO | Admitting: Hematology and Oncology

## 2024-03-15 ENCOUNTER — Other Ambulatory Visit (HOSPITAL_BASED_OUTPATIENT_CLINIC_OR_DEPARTMENT_OTHER): Payer: Self-pay | Admitting: Family Medicine

## 2024-03-15 DIAGNOSIS — I10 Essential (primary) hypertension: Secondary | ICD-10-CM

## 2024-03-16 DIAGNOSIS — I2510 Atherosclerotic heart disease of native coronary artery without angina pectoris: Secondary | ICD-10-CM | POA: Diagnosis not present

## 2024-03-16 DIAGNOSIS — Z79899 Other long term (current) drug therapy: Secondary | ICD-10-CM | POA: Diagnosis not present

## 2024-03-16 DIAGNOSIS — I480 Paroxysmal atrial fibrillation: Secondary | ICD-10-CM | POA: Diagnosis not present

## 2024-03-16 LAB — CBC
Hematocrit: 37.6 % (ref 37.5–51.0)
Hemoglobin: 12.3 g/dL — ABNORMAL LOW (ref 13.0–17.7)
MCH: 27.9 pg (ref 26.6–33.0)
MCHC: 32.7 g/dL (ref 31.5–35.7)
MCV: 85 fL (ref 79–97)
Platelets: 230 10*3/uL (ref 150–450)
RBC: 4.41 x10E6/uL (ref 4.14–5.80)
RDW: 14.5 % (ref 11.6–15.4)
WBC: 7.4 10*3/uL (ref 3.4–10.8)

## 2024-03-19 ENCOUNTER — Encounter: Payer: Self-pay | Admitting: Internal Medicine

## 2024-04-20 DIAGNOSIS — N401 Enlarged prostate with lower urinary tract symptoms: Secondary | ICD-10-CM | POA: Diagnosis not present

## 2024-04-20 DIAGNOSIS — R3914 Feeling of incomplete bladder emptying: Secondary | ICD-10-CM | POA: Diagnosis not present

## 2024-05-09 ENCOUNTER — Encounter: Payer: Self-pay | Admitting: Vascular Surgery

## 2024-05-09 ENCOUNTER — Ambulatory Visit: Payer: Medicare HMO | Attending: Vascular Surgery | Admitting: Vascular Surgery

## 2024-05-09 VITALS — BP 130/77 | HR 68 | Temp 98.0°F | Ht 69.0 in | Wt 234.0 lb

## 2024-05-09 DIAGNOSIS — I872 Venous insufficiency (chronic) (peripheral): Secondary | ICD-10-CM | POA: Diagnosis not present

## 2024-05-09 NOTE — Progress Notes (Signed)
**Note Stanley-Identified via Obfuscation**  Patient ID: [name], male   DOB: 1946-09-10, 78 y.o.   MRN: [id number]  Reason for Consult: Follow-up   Referred by Stanley Peru, Stanley PARAS, MD  Subjective:     HPI:  [name] is a 78 y.o. male initially from [place] history of lower extremities right greater than left which has been present for greater than 6 months.  He denies any history of DVT no previous vein interventions no trauma to the lower extremity.  He did have skin changes which have improved somewhat with compression stockings.    He is here today with his wife and all information was obtained via interpreter.  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 per dr fleeta dam note 06-18-2021   Hematuria 06/18/2021   Hypertension    Sleep apnea    uses cpap   Uses walker 07/24/2021   prn or cane   UTI (urinary tract infection)    finished antibiotics on 07-08-2021   Wound of skin 10/20/2021   sees wound care center for multiple dressing changes   Family History  Problem Relation Age of Onset   CAD Other    Past Surgical History:  Procedure Laterality Date   CARDIAC CATHETERIZATION     CYSTOSCOPY W/ URETERAL STENT PLACEMENT Right 07/28/2021   Procedure: CYSTOSCOPY WITH RETROGRADE PYELOGRAM/URETERAL STENT EXCHANGE;  Surgeon: Stanley Zachary NOVAK, MD;  Location: [facility];  Service: Urology;  Laterality: Right;  30 MINS   CYSTOSCOPY W/ URETERAL STENT PLACEMENT Right 10/27/2021   Procedure: CYSTOSCOPY WITH RETROGRADE PYELOGRAM/ URETERAL STENT REMOVAL;  Surgeon: Stanley Zachary NOVAK, MD;  Location: [facility];  Service: Urology;  Laterality: Right;   CYSTOSCOPY WITH  RETROGRADE PYELOGRAM, URETEROSCOPY AND STENT PLACEMENT Right 05/01/2021   Procedure: CYSTOSCOPY WITH RIGHT  RETROGRADE PYELOGRAM,  AND RIGHT STENT PLACEMENT;  Surgeon: Stanley Cough, MD;  Location: [facility];  Service: Urology;  Laterality: Right;   EYE SURGERY     INJECTION OF SILICONE OIL Left 11/10/2020   Procedure: INJECTION OF SILICONE OIL;  Surgeon: Jarold Mayo, MD;  Location: [facility] OR;  Service: Ophthalmology;  Laterality: Left;   INJECTION OF SILICONE OIL Left 12/25/2020   Procedure: INJECTION OF SILICONE OIL;  Surgeon: Jarold Mayo, MD;  Location: [facility] OR;  Service: Ophthalmology;  Laterality: Left;   JOINT REPLACEMENT Right    hip   LASER PHOTO ABLATION Left 12/25/2020   Procedure: LASER PHOTO ABLATION;  Surgeon: Jarold Mayo, MD;  Location: [facility] OR;  Service: Ophthalmology;  Laterality: Left;   PARS PLANA VITRECTOMY Left 11/10/2020   Procedure: PARS PLANA VITRECTOMY WITH 25 GAUGE;  Surgeon: Jarold Mayo, MD;  Location: [facility] OR;  Service: Ophthalmology;  Laterality: Left;   PARS PLANA VITRECTOMY Left 12/25/2020   Procedure: PARS PLANA VITRECTOMY WITH 25 GAUGE IN LEFT EYE;  Surgeon: Jarold Mayo, MD;  Location: [facility] OR;  Service: Ophthalmology;  Laterality: Left;   PERFLUORONE INJECTION Left 12/25/2020   Procedure: PERFLUORONE INJECTION;  Surgeon: Jarold Mayo, MD;  Location: [facility] OR;  Service: Ophthalmology;  Laterality: Left;   PHOTOCOAGULATION WITH LASER Left 11/10/2020  Procedure: PHOTOCOAGULATION WITH LASER;  Surgeon: Jarold Mayo, MD;  Location: [facility] OR;  Service: Ophthalmology;  Laterality: Left;   removal of picc line     09-2021   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   Procedure: RETINECTOMY LEFT EYE;  Surgeon: Jarold Mayo, MD;  Location: [facility] OR;  Service: Ophthalmology;  Laterality: Left;   SILICON OIL REMOVAL Left 12/25/2020   Procedure: SILICONE OIL REMOVAL;  Surgeon: Jarold Mayo, MD;  Location: [facility] OR;  Service: Ophthalmology;  Laterality:  Left;   TEE WITHOUT CARDIOVERSION N/A 05/29/2021   Procedure: TRANSESOPHAGEAL ECHOCARDIOGRAM (TEE);  Surgeon: Stanley Stanly LABOR, MD;  Location: [facility];  Service: Cardiovascular;  Laterality: N/A;    Short Social History:  Social History   Tobacco Use   Smoking status: Never    Passive exposure: Never   Smokeless tobacco: Never  Substance Use Topics   Alcohol use: Not Currently    No Known Allergies  Current Outpatient Medications  Medication Sig Dispense Refill   amiodarone  (PACERONE ) 200 MG tablet Take 1 tablet (200 mg total) by mouth daily. 90 tablet 3   amLODipine  (NORVASC ) 10 MG tablet Take 1 tablet by mouth once daily 90 tablet 0   apixaban  (ELIQUIS ) 5 MG TABS tablet Take 1 tablet (5 mg total) by mouth 2 (two) times daily. 180 tablet 1   clopidogrel  (PLAVIX ) 75 MG tablet Take 1 tablet (75 mg total) by mouth daily. 90 tablet 0   ferrous sulfate  325 (65 FE) MG tablet Take 1 tablet (325 mg total) by mouth daily with breakfast. Please take with a source of Vitamin C 90 tablet 3   finasteride  (PROSCAR ) 5 MG tablet Take 1 tablet (5 mg total) by mouth at bedtime. 90 tablet 0   Insulin  Pen Needle (NOVOFINE) 30G X 8 MM MISC Inject 10 each into the skin as needed. 100 each 6   isosorbide  mononitrate (IMDUR ) 30 MG 24 hr tablet Take 30 mg by mouth every morning.     LANTUS  SOLOSTAR 100 UNIT/ML Solostar Pen INJECT 34 UNITS SUBCUTANEOUSLY AT BEDTIME 15 mL 0   metFORMIN  (GLUCOPHAGE ) 1000 MG tablet Take 1 tablet (1,000 mg total) by mouth 2 (two) times daily with a meal. 180 tablet 1   metoprolol  tartrate (LOPRESSOR ) 50 MG tablet Take 1 tablet (50 mg total) by mouth 2 (two) times daily. 180 tablet 3   MOUNJARO 2.5 MG/0.5ML Pen Inject 2.5 mg into the skin once a week.     nitroGLYCERIN (NITROSTAT) 0.3 MG SL tablet Place 0.3 mg under the tongue every 5 (five) minutes x 3 doses as needed for chest pain.     rosuvastatin  (CRESTOR ) 10 MG tablet Take 1 tablet (10 mg total) by mouth daily.  90 tablet 3   torsemide  (DEMADEX ) 20 MG tablet Take 2 tablets (40 mg total) by mouth daily. 180 tablet 3   No current facility-administered medications for this visit.    Review of Systems  Constitutional:  Constitutional negative. HENT: HENT negative.  Cardiovascular: Positive for leg swelling.  GI: Gastrointestinal negative.  Musculoskeletal: Positive for leg pain.  Neurological: Neurological negative. Hematologic: Hematologic/lymphatic negative.  Psychiatric: Psychiatric negative.        Objective:  Objective   Vitals:   05/09/24 1053  BP: 130/77  Pulse: 68  Temp: 98 F (36.7 C)  SpO2: 95%  Weight: 234 lb (106.1 kg)  Height: 5' 9 (1.753 m)   Body mass index is 34.56 kg/m.  Physical Exam Constitutional:  Appearance: Normal appearance.  HENT:     Nose: Nose normal.     Mouth/Throat:     Mouth: Mucous membranes are moist.   Eyes:     Pupils: Pupils are equal, round, and reactive to light.   Abdominal:     General: Abdomen is flat.   Musculoskeletal:     Right lower leg: Edema present.     Left lower leg: Edema present.   Skin:    Capillary Refill: Capillary refill takes less than 2 seconds.   Neurological:     General: No focal deficit present.     Mental Status: He is alert.   Psychiatric:        Mood and Affect: Mood normal.        Thought Content: Thought content normal.        Judgment: Judgment normal.     Data: Venous Reflux Times  +----------------------------+---------+------+-----------+-----------+----  ----+  RIGHT                      Reflux NoRefluxReflux Time Diameter   Comments                                       Yes                 cms               +----------------------------+---------+------+-----------+-----------+----  ----+  CFV                                  yes   >1 second                       +----------------------------+---------+------+-----------+-----------+----  ----+  GSV  at SFJ                                   >500 ms     0.64               +----------------------------+---------+------+-----------+-----------+----  ----+  GSV prox thigh              no                           0.44               +----------------------------+---------+------+-----------+-----------+----  ----+  GSV mid thigh               no                           0.48               +----------------------------+---------+------+-----------+-----------+----  ----+  GSV dist thigh                        yes    >500 ms     0.50               +----------------------------+---------+------+-----------+-----------+----  ----+  GSV at knee  yes    >500 ms     0.45               +----------------------------+---------+------+-----------+-----------+----  ----+  GSV prox calf                         yes    >500 ms     0.41               +----------------------------+---------+------+-----------+-----------+----  ----+  GSV mid calf                          yes    >500 ms     0.35               +----------------------------+---------+------+-----------+-----------+----  ----+  SSV Pop Fossa               no                           0.30               +----------------------------+---------+------+-----------+-----------+----  ----+  SSV prox calf               no                           0.28               +----------------------------+---------+------+-----------+-----------+----  ----+  SSV mid calf                no                           0.27               +----------------------------+---------+------+-----------+-----------+----  ----+  Medial Calf Varicosities              yes    >500 ms     0.41               +----------------------------+---------+------+-----------+-----------+----  ----+  Medial Mid/Dist Calf                  yes    >500 ms     0.50                Perforator                                                                  +----------------------------+---------+------+-----------+-----------+----  ----+         Summary:  Right:  - No evidence of deep vein thrombosis seen in the right lower extremity,  from the common femoral through the popliteal veins.  - No evidence of superficial venous thrombosis in the right lower  extremity.  - Venous reflux is noted in the right common femoral vein.  - Venous reflux is noted in the right sapheno-femoral junction.  - Venous reflux is noted in the right greater saphenous vein in the thigh.  - Venous reflux is noted in the right greater saphenous vein in the calf.  - Venous reflux is noted in  the right perforator vein.         Assessment/Plan:    78 year old male with what appears to be chronic venous insufficiency leading to skin changes of the right lower extremity with swelling consistent with C4 venous disease.  I evaluated his great saphenous vein at bedside today and it is at least 0.5 cm throughout its course and gives rise to multiple varicosities below the knee.  As such he would be a good candidate for great saphenous vein ablation.  His most recent reflux study however did not demonstrate reflux in the mid thigh and therefore we will repeat the test to ensure that he will have adequate benefit from any intervention.  I have recommended continued thigh-high compression stockings particularly when he is out of bed and I will follow him up in 3 months with repeat reflux study.  All questions answered via interpreter today.     Penne Lonni Colorado MD [facility]

## 2024-05-11 ENCOUNTER — Other Ambulatory Visit: Payer: Self-pay | Admitting: *Deleted

## 2024-05-11 ENCOUNTER — Other Ambulatory Visit: Payer: Self-pay | Admitting: Internal Medicine

## 2024-05-11 DIAGNOSIS — I872 Venous insufficiency (chronic) (peripheral): Secondary | ICD-10-CM

## 2024-06-14 ENCOUNTER — Other Ambulatory Visit: Payer: Self-pay | Admitting: Internal Medicine

## 2024-06-14 DIAGNOSIS — I480 Paroxysmal atrial fibrillation: Secondary | ICD-10-CM

## 2024-06-14 NOTE — Telephone Encounter (Signed)
 Prescription refill request for Eliquis  received. Indication: afib  Last office visit: 11/25/2023, Dick  Scr: 1.54, 02/15/2024 Age:  78 yo  Weight: 106.1 kg   Refill sent.

## 2024-06-21 ENCOUNTER — Encounter (HOSPITAL_BASED_OUTPATIENT_CLINIC_OR_DEPARTMENT_OTHER): Payer: Self-pay | Admitting: *Deleted

## 2024-06-21 ENCOUNTER — Ambulatory Visit (INDEPENDENT_AMBULATORY_CARE_PROVIDER_SITE_OTHER): Admitting: Family Medicine

## 2024-06-21 ENCOUNTER — Ambulatory Visit (HOSPITAL_BASED_OUTPATIENT_CLINIC_OR_DEPARTMENT_OTHER): Admitting: Family Medicine

## 2024-06-21 ENCOUNTER — Encounter (HOSPITAL_BASED_OUTPATIENT_CLINIC_OR_DEPARTMENT_OTHER): Payer: Self-pay | Admitting: Family Medicine

## 2024-06-21 VITALS — BP 133/85 | HR 76 | Ht 70.87 in | Wt 231.1 lb

## 2024-06-21 DIAGNOSIS — R21 Rash and other nonspecific skin eruption: Secondary | ICD-10-CM | POA: Insufficient documentation

## 2024-06-21 DIAGNOSIS — Z794 Long term (current) use of insulin: Secondary | ICD-10-CM

## 2024-06-21 DIAGNOSIS — I10 Essential (primary) hypertension: Secondary | ICD-10-CM

## 2024-06-21 DIAGNOSIS — N182 Chronic kidney disease, stage 2 (mild): Secondary | ICD-10-CM | POA: Diagnosis not present

## 2024-06-21 DIAGNOSIS — E1122 Type 2 diabetes mellitus with diabetic chronic kidney disease: Secondary | ICD-10-CM | POA: Diagnosis not present

## 2024-06-21 LAB — POCT GLYCOSYLATED HEMOGLOBIN (HGB A1C)
HbA1c POC (<> result, manual entry): 6.4 % (ref 4.0–5.6)
HbA1c, POC (controlled diabetic range): 6.4 % (ref 0.0–7.0)
HbA1c, POC (prediabetic range): 6.4 % (ref 5.7–6.4)
Hemoglobin A1C: 6.4 % — AB (ref 4.0–5.6)

## 2024-06-21 MED ORDER — CLOTRIMAZOLE 1 % EX CREA: 1.0000 | TOPICAL_CREAM | Freq: Two times a day (BID) | CUTANEOUS | 0 refills | Status: AC

## 2024-06-21 MED ORDER — METOPROLOL TARTRATE 50 MG PO TABS: 50.0000 mg | ORAL_TABLET | Freq: Two times a day (BID) | ORAL | 3 refills | Status: DC

## 2024-06-21 MED ORDER — METFORMIN HCL 1000 MG PO TABS: 1000.0000 mg | ORAL_TABLET | Freq: Two times a day (BID) | ORAL | 1 refills | Status: AC

## 2024-06-21 MED ORDER — PREGABALIN 50 MG PO CAPS: 50.0000 mg | ORAL_CAPSULE | Freq: Every day | ORAL | 1 refills | Status: DC

## 2024-06-21 NOTE — Assessment & Plan Note (Signed)
 Patient continues with insulin  and metformin .  He does follow-up with endocrinologist as well.  Continues with Mounjaro as prescribed by endocrinology.  No reported issues with medications. Recommend continue close follow-up with Dr. Balan.   A1c checked today and notably improved at 6.4%. can continue with current medications, congratulated on progress thus far.

## 2024-06-21 NOTE — Progress Notes (Signed)
    Procedures performed today:    None.  Independent interpretation of notes and tests performed by another provider:   None.  Brief History, Exam, Impression, and Recommendations:    BP 133/85 (BP Location: Right Arm, Patient Position: Sitting, Cuff Size: Normal)   Pulse 76   Ht 5' 10.87 (1.8 m)   Wt 231 lb 1.6 oz (104.8 kg)   SpO2 97%   BMI 32.35 kg/m   Type 2 diabetes mellitus with stage 2 chronic kidney disease, with long-term current use of insulin  [facility]) Assessment & Plan: Patient continues with insulin  and metformin .  He does follow-up with endocrinologist as well.  Continues with Mounjaro as prescribed by endocrinology.  No reported issues with medications. Recommend continue close follow-up with Dr. Balan.   A1c checked today and notably improved at 6.4%. can continue with current medications, congratulated on progress thus far.  Orders: -     POCT glycosylated hemoglobin (Hb A1C)  Primary hypertension Assessment & Plan: Blood pressure borderline controlled in office today.  He does continue to follow-up regularly with cardiology.  He continues with amiodarone , amlodipine , isosorbide  mononitrate, metoprolol .  Requesting refill of some medications today, refilled as below.  Recommend continued follow-up with cardiology.  Recommend intermittent monitoring of blood pressure at home, DASH diet   Rash Assessment & Plan: Patient reports having area over right forearm that has been present for couple weeks.  Area has been red, very slightly bumpy, denies any significant pain or itching in the area does not recall any bites or injuries to the area.  Denies any prior similar appearing lesions in the past, no other lesions present elsewhere currently. On exam, patient does have erythematous circular lesion with various slightly raised edges, mild clearing present centrally.  Area is blanching. Area appears most consistent with fungal etiology, although would expect the patient  to be experiencing pruritus.  For now, would recommend beginning treatment with topical antifungal and monitoring response.  If not responding as expected, recommend returning to the office or having further evaluation with dermatologist.   Other orders -     Metoprolol  Tartrate; Take 1 tablet (50 mg total) by mouth 2 (two) times daily.  Dispense: 180 tablet; Refill: 3 -     metFORMIN  HCl; Take 1 tablet (1,000 mg total) by mouth 2 (two) times daily with a meal.  Dispense: 180 tablet; Refill: 1 -     Pregabalin ; Take 1 capsule (50 mg total) by mouth daily.  Dispense: 90 capsule; Refill: 1 -     Clotrimazole ; Apply 1 Application topically 2 (two) times daily.  Dispense: 30 g; Refill: 0  Return in about 4 months (around 10/21/2024) for hypertension, diabetes.   ___________________________________________ Natayla Cadenhead de [place], MD, ABFM, CAQSM Primary Care and Sports Medicine [facility]

## 2024-06-21 NOTE — Patient Instructions (Signed)

## 2024-06-21 NOTE — Assessment & Plan Note (Signed)
 Patient reports having area over right forearm that has been present for couple weeks.  Area has been red, very slightly bumpy, denies any significant pain or itching in the area does not recall any bites or injuries to the area.  Denies any prior similar appearing lesions in the past, no other lesions present elsewhere currently. On exam, patient does have erythematous circular lesion with various slightly raised edges, mild clearing present centrally.  Area is blanching. Area appears most consistent with fungal etiology, although would expect the patient to be experiencing pruritus.  For now, would recommend beginning treatment with topical antifungal and monitoring response.  If not responding as expected, recommend returning to the office or having further evaluation with dermatologist.

## 2024-06-21 NOTE — Assessment & Plan Note (Signed)
 Blood pressure borderline controlled in office today.  He does continue to follow-up regularly with cardiology.  He continues with amiodarone , amlodipine , isosorbide  mononitrate, metoprolol .  Requesting refill of some medications today, refilled as below.  Recommend continued follow-up with cardiology.  Recommend intermittent monitoring of blood pressure at home, DASH diet

## 2024-07-02 ENCOUNTER — Telehealth: Payer: Self-pay

## 2024-07-02 DIAGNOSIS — I2510 Atherosclerotic heart disease of native coronary artery without angina pectoris: Secondary | ICD-10-CM

## 2024-07-02 DIAGNOSIS — I480 Paroxysmal atrial fibrillation: Secondary | ICD-10-CM

## 2024-07-03 NOTE — Telephone Encounter (Signed)
 Contacted the patients daughter and made her aware that the patient will need to have labs before his CT. She was advised the have the patient do labs 1-2 weeks before CT scheduled on 08/22/24. She voiced understanding.   Lab slip will be mailed to the patient.

## 2024-08-06 ENCOUNTER — Other Ambulatory Visit (HOSPITAL_BASED_OUTPATIENT_CLINIC_OR_DEPARTMENT_OTHER): Payer: Self-pay | Admitting: Family Medicine

## 2024-08-06 DIAGNOSIS — E1122 Type 2 diabetes mellitus with diabetic chronic kidney disease: Secondary | ICD-10-CM

## 2024-08-07 DIAGNOSIS — H52223 Regular astigmatism, bilateral: Secondary | ICD-10-CM | POA: Diagnosis not present

## 2024-08-07 DIAGNOSIS — H35373 Puckering of macula, bilateral: Secondary | ICD-10-CM | POA: Diagnosis not present

## 2024-08-07 DIAGNOSIS — H338 Other retinal detachments: Secondary | ICD-10-CM | POA: Diagnosis not present

## 2024-08-07 DIAGNOSIS — H26491 Other secondary cataract, right eye: Secondary | ICD-10-CM | POA: Diagnosis not present

## 2024-08-07 DIAGNOSIS — E113293 Type 2 diabetes mellitus with mild nonproliferative diabetic retinopathy without macular edema, bilateral: Secondary | ICD-10-CM | POA: Diagnosis not present

## 2024-08-07 LAB — OPHTHALMOLOGY REPORT-SCANNED

## 2024-08-15 DIAGNOSIS — I480 Paroxysmal atrial fibrillation: Secondary | ICD-10-CM | POA: Diagnosis not present

## 2024-08-15 DIAGNOSIS — I2510 Atherosclerotic heart disease of native coronary artery without angina pectoris: Secondary | ICD-10-CM | POA: Diagnosis not present

## 2024-08-15 LAB — BASIC METABOLIC PANEL WITH GFR
BUN/Creatinine Ratio: 19 (ref 10–24)
BUN: 23 mg/dL (ref 8–27)
CO2: 21 mmol/L (ref 20–29)
Calcium: 9 mg/dL (ref 8.6–10.2)
Chloride: 106 mmol/L (ref 96–106)
Creatinine, Ser: 1.23 mg/dL (ref 0.76–1.27)
Glucose: 136 mg/dL — ABNORMAL HIGH (ref 70–99)
Potassium: 4 mmol/L (ref 3.5–5.2)
Sodium: 144 mmol/L (ref 134–144)
eGFR: 60 mL/min/1.73 (ref >59)

## 2024-08-16 ENCOUNTER — Ambulatory Visit: Payer: Self-pay | Admitting: Nurse Practitioner

## 2024-08-20 DIAGNOSIS — H26491 Other secondary cataract, right eye: Secondary | ICD-10-CM | POA: Diagnosis not present

## 2024-08-20 DIAGNOSIS — H3322 Serous retinal detachment, left eye: Secondary | ICD-10-CM | POA: Diagnosis not present

## 2024-08-22 ENCOUNTER — Encounter: Payer: Self-pay | Admitting: Vascular Surgery

## 2024-08-22 ENCOUNTER — Ambulatory Visit (HOSPITAL_COMMUNITY)
Admission: RE | Admit: 2024-08-22 | Discharge: 2024-08-22 | Disposition: A | Discharge status: Home / Self Care | Source: Ambulatory Visit | Attending: Vascular Surgery | Admitting: Vascular Surgery

## 2024-08-22 ENCOUNTER — Ambulatory Visit: Admitting: Vascular Surgery

## 2024-08-22 ENCOUNTER — Ambulatory Visit (HOSPITAL_COMMUNITY)
Admission: RE | Admit: 2024-08-22 | Discharge: 2024-08-22 | Disposition: A | Discharge status: Home / Self Care | Source: Ambulatory Visit | Attending: Nurse Practitioner | Admitting: Nurse Practitioner

## 2024-08-22 ENCOUNTER — Ambulatory Visit (HOSPITAL_COMMUNITY): Admission: RE | Admit: 2024-08-22 | Attending: Nurse Practitioner | Admitting: Nurse Practitioner

## 2024-08-22 VITALS — BP 132/76 | HR 64 | Temp 98.3°F | Ht 70.0 in | Wt 242.9 lb

## 2024-08-22 DIAGNOSIS — E1159 Type 2 diabetes mellitus with other circulatory complications: Secondary | ICD-10-CM | POA: Insufficient documentation

## 2024-08-22 DIAGNOSIS — I5032 Chronic diastolic (congestive) heart failure: Secondary | ICD-10-CM | POA: Insufficient documentation

## 2024-08-22 DIAGNOSIS — I350 Nonrheumatic aortic (valve) stenosis: Secondary | ICD-10-CM | POA: Insufficient documentation

## 2024-08-22 DIAGNOSIS — I7121 Aneurysm of the ascending aorta, without rupture: Secondary | ICD-10-CM | POA: Insufficient documentation

## 2024-08-22 DIAGNOSIS — I872 Venous insufficiency (chronic) (peripheral): Secondary | ICD-10-CM | POA: Insufficient documentation

## 2024-08-22 DIAGNOSIS — I2510 Atherosclerotic heart disease of native coronary artery without angina pectoris: Secondary | ICD-10-CM | POA: Insufficient documentation

## 2024-08-22 DIAGNOSIS — I152 Hypertension secondary to endocrine disorders: Secondary | ICD-10-CM | POA: Insufficient documentation

## 2024-08-22 DIAGNOSIS — I480 Paroxysmal atrial fibrillation: Secondary | ICD-10-CM | POA: Insufficient documentation

## 2024-08-22 MED ORDER — IOHEXOL 350 MG/ML SOLN
75.0000 mL | Freq: Once | INTRAVENOUS | Status: AC | PRN
Start: 2024-08-22 — End: 2024-08-22
  Administered 2024-08-22: 75 mL via INTRAVENOUS

## 2024-08-22 NOTE — Progress Notes (Signed)
 Patient ID: [name], male   DOB: 12-07-1945, 78 y.o.   MRN: [id number]  Reason for Consult: Follow-up   Referred by de [place], Stanley PARAS, MD  Subjective:     HPI:  [name] is a 78 y.o. male originally from [place] with history of right greater than left lower extremity swelling.  He has had difficulty getting compression stocking in place due to amount of swelling that he experiences as well as pressure on his skin but he has worn it as much as tolerable.  In the past he has been to a wound care center for wounds of the right lower extremity that did heal.  He has associated pain of the right lower extremity.  His skin is somewhat worse than last visit has not been evaluated wound care center due to difficulty with transportation.  He is accompanied by his wife today and they also have interpreter.  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 per dr fleeta dam note 06-18-2021   Hematuria 06/18/2021   Hypertension    Sleep apnea    uses cpap   Uses walker 07/24/2021   prn or cane   UTI (urinary tract infection)    finished antibiotics on 07-08-2021   Wound of skin 10/20/2021   sees wound care center for multiple dressing changes   Family History  Problem Relation Age of Onset   CAD Other    Past Surgical History:  Procedure Laterality Date   CARDIAC CATHETERIZATION     CYSTOSCOPY W/ URETERAL STENT PLACEMENT Right 07/28/2021   Procedure: CYSTOSCOPY WITH RETROGRADE PYELOGRAM/URETERAL STENT EXCHANGE;  Surgeon: Rosalind Zachary NOVAK, MD;  Location: [facility];  Service: Urology;  Laterality: Right;  30 MINS   CYSTOSCOPY W/ URETERAL STENT PLACEMENT Right  10/27/2021   Procedure: CYSTOSCOPY WITH RETROGRADE PYELOGRAM/ URETERAL STENT REMOVAL;  Surgeon: Rosalind Zachary NOVAK, MD;  Location: [facility];  Service: Urology;  Laterality: Right;   CYSTOSCOPY WITH RETROGRADE PYELOGRAM, URETEROSCOPY AND STENT PLACEMENT Right 05/01/2021   Procedure: CYSTOSCOPY WITH RIGHT  RETROGRADE PYELOGRAM,  AND RIGHT STENT PLACEMENT;  Surgeon: Nieves Cough, MD;  Location: [facility];  Service: Urology;  Laterality: Right;   EYE SURGERY     INJECTION OF SILICONE OIL Left 11/10/2020   Procedure: INJECTION OF SILICONE OIL;  Surgeon: Jarold Mayo, MD;  Location: [facility] OR;  Service: Ophthalmology;  Laterality: Left;   INJECTION OF SILICONE OIL Left 12/25/2020   Procedure: INJECTION OF SILICONE OIL;  Surgeon: Jarold Mayo, MD;  Location: [facility] OR;  Service: Ophthalmology;  Laterality: Left;   JOINT REPLACEMENT Right    hip   LASER PHOTO ABLATION Left 12/25/2020   Procedure: LASER PHOTO ABLATION;  Surgeon: Jarold Mayo, MD;  Location: [facility] OR;  Service: Ophthalmology;  Laterality: Left;   PARS PLANA VITRECTOMY Left 11/10/2020   Procedure: PARS PLANA VITRECTOMY WITH 25 GAUGE;  Surgeon: Jarold Mayo, MD;  Location: [facility] OR;  Service: Ophthalmology;  Laterality: Left;   PARS PLANA VITRECTOMY Left 12/25/2020   Procedure: PARS PLANA VITRECTOMY WITH 25 GAUGE IN LEFT EYE;  Surgeon: Jarold,  Selinda, MD;  Location: [facility] OR;  Service: Ophthalmology;  Laterality: Left;   PERFLUORONE INJECTION Left 12/25/2020   Procedure: PERFLUORONE INJECTION;  Surgeon: Jarold Selinda, MD;  Location: [facility] OR;  Service: Ophthalmology;  Laterality: Left;   PHOTOCOAGULATION WITH LASER Left 11/10/2020   Procedure: PHOTOCOAGULATION WITH LASER;  Surgeon: Jarold Selinda, MD;  Location: [facility] OR;  Service: Ophthalmology;  Laterality: Left;   removal of picc line     09-2021   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   Procedure: RETINECTOMY LEFT EYE;  Surgeon: Jarold Selinda, MD;   Location: [facility] OR;  Service: Ophthalmology;  Laterality: Left;   SILICON OIL REMOVAL Left 12/25/2020   Procedure: SILICONE OIL REMOVAL;  Surgeon: Jarold Selinda, MD;  Location: [facility] OR;  Service: Ophthalmology;  Laterality: Left;   TEE WITHOUT CARDIOVERSION N/A 05/29/2021   Procedure: TRANSESOPHAGEAL ECHOCARDIOGRAM (TEE);  Surgeon: Santo Stanly LABOR, MD;  Location: [facility];  Service: Cardiovascular;  Laterality: N/A;    Short Social History:  Social History   Tobacco Use   Smoking status: Never    Passive exposure: Never   Smokeless tobacco: Never  Substance Use Topics   Alcohol use: Not Currently    No Known Allergies  Current Outpatient Medications  Medication Sig Dispense Refill   amiodarone  (PACERONE ) 200 MG tablet Take 1 tablet (200 mg total) by mouth daily. 90 tablet 3   amLODipine  (NORVASC ) 10 MG tablet Take 1 tablet by mouth once daily 90 tablet 0   apixaban  (ELIQUIS ) 5 MG TABS tablet Take 1 tablet by mouth twice daily 180 tablet 1   clopidogrel  (PLAVIX ) 75 MG tablet Take 1 tablet by mouth once daily 90 tablet 1   clotrimazole  (LOTRIMIN ) 1 % cream Apply 1 Application topically 2 (two) times daily. 30 g 0   ferrous sulfate  325 (65 FE) MG tablet Take 1 tablet (325 mg total) by mouth daily with breakfast. Please take with a source of Vitamin C 90 tablet 3   finasteride  (PROSCAR ) 5 MG tablet TAKE 1 TABLET BY MOUTH AT BEDTIME 90 tablet 0   Insulin  Pen Needle (NOVOFINE) 30G X 8 MM MISC Inject 10 each into the skin as needed. 100 each 6   isosorbide  mononitrate (IMDUR ) 30 MG 24 hr tablet Take 30 mg by mouth every morning.     LANTUS  SOLOSTAR 100 UNIT/ML Solostar Pen INJECT 34 UNITS SUBCUTANEOUSLY AT BEDTIME 15 mL 0   metFORMIN  (GLUCOPHAGE ) 1000 MG tablet Take 1 tablet (1,000 mg total) by mouth 2 (two) times daily with a meal. 180 tablet 1   metoprolol  tartrate (LOPRESSOR ) 50 MG tablet Take 1 tablet (50 mg total) by mouth 2 (two) times daily. 180 tablet 3   MOUNJARO 2.5  MG/0.5ML Pen Inject 2.5 mg into the skin once a week.     nitroGLYCERIN (NITROSTAT) 0.3 MG SL tablet Place 0.3 mg under the tongue every 5 (five) minutes x 3 doses as needed for chest pain.     pregabalin  (LYRICA ) 50 MG capsule Take 1 capsule (50 mg total) by mouth daily. 90 capsule 1   rosuvastatin  (CRESTOR ) 10 MG tablet Take 1 tablet (10 mg total) by mouth daily. 90 tablet 3   torsemide  (DEMADEX ) 20 MG tablet Take 2 tablets (40 mg total) by mouth daily. 180 tablet 3   No current facility-administered medications for this visit.    Review of Systems  Constitutional:  Constitutional negative. HENT: HENT negative.  Eyes: Eyes negative.  Cardiovascular: Positive for leg swelling.  GI: Gastrointestinal negative.  Musculoskeletal: Positive for leg pain.  Skin: Positive for rash and wound.  Neurological: Neurological negative. Hematologic: Hematologic/lymphatic negative.  Psychiatric: Psychiatric negative.        Objective:  Objective   Vitals:   08/22/24 1520  BP: 132/76  Pulse: 64  Temp: 98.3 F (36.8 C)  SpO2: 93%  Weight: 242 lb 14.4 oz (110.2 kg)  Height: 5' 10 (1.778 m)   Body mass index is 34.85 kg/m.  Physical Exam HENT:     Nose: Nose normal.     Mouth/Throat:     Mouth: Mucous membranes are moist.  Cardiovascular:     Rate and Rhythm: Normal rate.  Pulmonary:     Effort: Pulmonary effort is normal.  Abdominal:     General: Abdomen is flat.  Musculoskeletal:     Right lower leg: Edema present.     Left lower leg: Edema present.  Skin:    General: Skin is dry.     Findings: Rash present.  Neurological:     Mental Status: He is alert.     Data: Venous Reflux Times  +----------------------------+---------+------+----------+------------+----  ----+  RIGHT                      Reflux NoReflux  Reflux  Diameter  cmsComments                                       Yes     Time                           +----------------------------+---------+------+----------+------------+----  ----+  CFV                                  yes  >1 second                        +----------------------------+---------+------+----------+------------+----  ----+  FV mid                      no                                              +----------------------------+---------+------+----------+------------+----  ----+  Popliteal                  no                                              +----------------------------+---------+------+----------+------------+----  ----+  GSV at [facility]                            yes   >500 ms      0.66               +----------------------------+---------+------+----------+------------+----  ----+  GSV prox thigh                        yes   >500 ms  0.55               +----------------------------+---------+------+----------+------------+----  ----+  GSV mid thigh                         yes   >500 ms      0.55               +----------------------------+---------+------+----------+------------+----  ----+  GSV dist thigh                        yes   >500 ms      0.71               +----------------------------+---------+------+----------+------------+----  ----+  GSV at knee                 no                           0.50               +----------------------------+---------+------+----------+------------+----  ----+  GSV prox calf               no                           0.49               +----------------------------+---------+------+----------+------------+----  ----+  SSV posterior thigh         no                           0.3                extension                                                                   +----------------------------+---------+------+----------+------------+----  ----+  SSV knee                              yes   >500 ms      0.4                 +----------------------------+---------+------+----------+------------+----  ----+  SSV prox calf               no                           0.38               +----------------------------+---------+------+----------+------------+----  ----+         Summary:  Right:  - No evidence of deep vein thrombosis seen in the right lower extremity,  from the common femoral through the popliteal veins.  - No evidence of superficial venous thrombosis in the right lower  extremity.    - The deep venous system is incompetent involving the common femoral vein.  - The great saphenous vein is incompetent.  - The small saphenous vein is incompetent.    I evaluated the right greater saphenous vein at bedside and it  is large and within the fascia from below the knee all the way to the saphenofemoral junction and multiple varicosities arise from the saphenous vein throughout its course.     Assessment/Plan:     78 year old male with C5 venous disease now with very poor skin of the right lower extremity.  I have recommended Unna boot and he will need home health care due to difficulties with transportation to reach a wound care center.  We also discussed right greater saphenous vein ablation due to to the large vein and refluxing component which is exacerbating swelling and skin changes of the right lower extremity.  We discussed the need for right greater saphenous vein ablation and risk of DVT although patient is on Eliquis  and will need to hold for 48 hours prior to procedure.  We also discussed the need for continued compression stockings perioperatively and likely moving forward.  For the time being we will proceed with Unna boot to help the skin improved.  We discussed all risk and benefits via interpreter patient demonstrates good understanding we will schedule her right greater saphenous vein ablation in the near future.     Penne Lonni Colorado MD Vascular and Vein  [facility]

## 2024-08-23 ENCOUNTER — Other Ambulatory Visit: Payer: Self-pay | Admitting: *Deleted

## 2024-08-23 DIAGNOSIS — I83018 Varicose veins of right lower extremity with ulcer other part of lower leg: Secondary | ICD-10-CM

## 2024-08-25 DIAGNOSIS — Z794 Long term (current) use of insulin: Secondary | ICD-10-CM | POA: Diagnosis not present

## 2024-08-25 DIAGNOSIS — I4891 Unspecified atrial fibrillation: Secondary | ICD-10-CM | POA: Diagnosis not present

## 2024-08-25 DIAGNOSIS — I2510 Atherosclerotic heart disease of native coronary artery without angina pectoris: Secondary | ICD-10-CM | POA: Diagnosis not present

## 2024-08-25 DIAGNOSIS — I872 Venous insufficiency (chronic) (peripheral): Secondary | ICD-10-CM | POA: Diagnosis not present

## 2024-08-25 DIAGNOSIS — L97811 Non-pressure chronic ulcer of other part of right lower leg limited to breakdown of skin: Secondary | ICD-10-CM | POA: Diagnosis not present

## 2024-08-25 DIAGNOSIS — Z7902 Long term (current) use of antithrombotics/antiplatelets: Secondary | ICD-10-CM | POA: Diagnosis not present

## 2024-08-25 DIAGNOSIS — Z7901 Long term (current) use of anticoagulants: Secondary | ICD-10-CM | POA: Diagnosis not present

## 2024-08-25 DIAGNOSIS — E119 Type 2 diabetes mellitus without complications: Secondary | ICD-10-CM | POA: Diagnosis not present

## 2024-08-25 DIAGNOSIS — I10 Essential (primary) hypertension: Secondary | ICD-10-CM | POA: Diagnosis not present

## 2024-08-28 ENCOUNTER — Ambulatory Visit (HOSPITAL_COMMUNITY): Payer: Self-pay | Admitting: Nurse Practitioner

## 2024-08-28 NOTE — Progress Notes (Unsigned)
 PATIENT: [name] DOB: June 26, 1946  REASON FOR VISIT: follow up HISTORY FROM: patient  No chief complaint on file.    HISTORY OF PRESENT ILLNESS:  08/28/24 ALL:  [name] returns for follow up for OSA on CPAP.   Check pressure setting should be 7-12, last AHI 14  08/25/2023 ALL:  [name] is a 78 y.o. male here today for follow up for OSA on CPAP.  He was seen in consult with Dr Buck 04/2023 in need of a new CPAP machine. HST 05/2023 showed severe obstructive sleep apnea with a total AHI of 41.4/hour and O2 nadir of 83%. AutoPAP ordered. Since, he reports doing very well. He denies concerns with new machine. He was refitted for a larger FFM and doing well. No significant leaks. He is tolerating pressure settings. AHI has been elevated.     HISTORY: (copied from Dr Obie previous note)  Dear Dr. de Peru,   I saw your patient, [name], upon your kind request in my sleep clinic today for initial consultation of his sleep disorder, in particular, evaluation of his prior diagnosis of obstructive sleep apnea.  The patient is accompanied by his wife and a [place] interpreter, Caldwell today.  The patient himself speaks [place] and [place] but also some [place]. As you know, Mr. Jeziorski is a 78 year old male with an underlying medical history of diabetes, chronic kidney disease, coronary artery disease, diffuse large B-cell lymphoma, endocarditis, hypertension, and obesity, who was previously diagnosed with obstructive sleep apnea and placed on PAP therapy.  Prior sleep study results are not available for my review today.  I was able to review his PAP compliance data from the past 30 days.  His Epworth sleepiness score is 14 out of 24, fatigue severity score is 9 out of 63.    He has an older Scientist, forensic.  He has been compliant with treatment, between 03/20/2023 and 04/18/2023 he used his machine 29 out of 30 days with percent use days greater  than 4 hours at 96.7%, indicating excellent compliance with an average usage for days on treatment of 6 hours and 45 minutes, residual AHI elevated in the mild to maybe borderline moderate range at 14.5/h, 90th percentile of pressure at 10.5 cm.  He is AutoPap is set for a minimum pressure of 7 cm and maximum at 10.5 cm.  He does not have a high leak from his mask, he indicates that he uses a fullface mask.  He has been compliant with treatment, sleep study testing was years ago, probably close to 11 years ago.  He was in [place]  state.  He moved to [place] area with his wife, they have a daughter who lives here.  They have a total of 8 children, 33 grandchildren and 8 great-grandchildren.  Bedtime is generally between 10 and 11 and rise time between 6:30 AM and 7 AM.  He does have nocturia about 2-3 times per average night.  He drinks caffeine in the form of tea, 1 cup in the morning, no alcohol, and he is a non-smoker.  He denies recurrent morning headaches or nocturnal headaches.  He had a tonsillectomy as a child.  He would be willing to continue with PAP therapy but would like a new machine.    REVIEW OF SYSTEMS: Out of a complete 14 system review of symptoms, the patient complains only of the following symptoms, gait instability and all other reviewed systems are negative.  ESS: 9/24  ALLERGIES: No Known Allergies  HOME MEDICATIONS: Outpatient Medications Prior to Visit  Medication Sig Dispense Refill   amiodarone  (PACERONE ) 200 MG tablet Take 1 tablet (200 mg total) by mouth daily. 90 tablet 3   amLODipine  (NORVASC ) 10 MG tablet Take 1 tablet by mouth once daily 90 tablet 0   apixaban  (ELIQUIS ) 5 MG TABS tablet Take 1 tablet by mouth twice daily 180 tablet 1   clopidogrel  (PLAVIX ) 75 MG tablet Take 1 tablet by mouth once daily 90 tablet 1   clotrimazole  (LOTRIMIN ) 1 % cream Apply 1 Application topically 2 (two) times daily. 30 g 0   ferrous sulfate  325 (65 FE) MG tablet Take 1 tablet  (325 mg total) by mouth daily with breakfast. Please take with a source of Vitamin C 90 tablet 3   finasteride  (PROSCAR ) 5 MG tablet TAKE 1 TABLET BY MOUTH AT BEDTIME 90 tablet 0   Insulin  Pen Needle (NOVOFINE) 30G X 8 MM MISC Inject 10 each into the skin as needed. 100 each 6   isosorbide  mononitrate (IMDUR ) 30 MG 24 hr tablet Take 30 mg by mouth every morning.     LANTUS  SOLOSTAR 100 UNIT/ML Solostar Pen INJECT 34 UNITS SUBCUTANEOUSLY AT BEDTIME 15 mL 0   metFORMIN  (GLUCOPHAGE ) 1000 MG tablet Take 1 tablet (1,000 mg total) by mouth 2 (two) times daily with a meal. 180 tablet 1   metoprolol  tartrate (LOPRESSOR ) 50 MG tablet Take 1 tablet (50 mg total) by mouth 2 (two) times daily. 180 tablet 3   MOUNJARO 2.5 MG/0.5ML Pen Inject 2.5 mg into the skin once a week.     nitroGLYCERIN (NITROSTAT) 0.3 MG SL tablet Place 0.3 mg under the tongue every 5 (five) minutes x 3 doses as needed for chest pain.     pregabalin  (LYRICA ) 50 MG capsule Take 1 capsule (50 mg total) by mouth daily. 90 capsule 1   rosuvastatin  (CRESTOR ) 10 MG tablet Take 1 tablet (10 mg total) by mouth daily. 90 tablet 3   torsemide  (DEMADEX ) 20 MG tablet Take 2 tablets (40 mg total) by mouth daily. 180 tablet 3   No facility-administered medications prior to visit.    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 per dr fleeta dam note 06-18-2021   Hematuria 06/18/2021   Hypertension    Sleep apnea    uses cpap   Uses walker 07/24/2021   prn or cane   UTI (urinary tract infection)    finished antibiotics on 07-08-2021   Wound of skin 10/20/2021   sees wound care center for multiple dressing changes     PAST SURGICAL HISTORY: Past Surgical History:  Procedure Laterality Date   CARDIAC CATHETERIZATION     CYSTOSCOPY W/ URETERAL STENT PLACEMENT Right 07/28/2021   Procedure: CYSTOSCOPY WITH RETROGRADE PYELOGRAM/URETERAL STENT EXCHANGE;  Surgeon: Rosalind Zachary NOVAK, MD;  Location: [facility];  Service: Urology;  Laterality: Right;  30 MINS   CYSTOSCOPY W/ URETERAL STENT PLACEMENT Right 10/27/2021   Procedure: CYSTOSCOPY WITH RETROGRADE PYELOGRAM/ URETERAL STENT REMOVAL;  Surgeon: Rosalind Zachary NOVAK, MD;  Location: [facility];  Service: Urology;  Laterality: Right;   CYSTOSCOPY WITH RETROGRADE PYELOGRAM, URETEROSCOPY AND STENT PLACEMENT Right 05/01/2021   Procedure: CYSTOSCOPY WITH RIGHT  RETROGRADE PYELOGRAM,  AND RIGHT STENT PLACEMENT;  Surgeon: Nieves Cough, MD;  Location: [facility];  Service: Urology;  Laterality: Right;   EYE SURGERY     INJECTION OF SILICONE OIL Left 11/10/2020   Procedure: INJECTION OF SILICONE OIL;  Surgeon: Jarold Mayo, MD;  Location: [facility] OR;  Service: Ophthalmology;  Laterality: Left;   INJECTION OF SILICONE OIL Left 12/25/2020   Procedure: INJECTION OF SILICONE OIL;  Surgeon: Jarold Mayo, MD;  Location: [facility] OR;  Service: Ophthalmology;  Laterality: Left;   JOINT REPLACEMENT Right    hip   LASER PHOTO ABLATION Left 12/25/2020   Procedure: LASER PHOTO ABLATION;  Surgeon: Jarold Mayo, MD;  Location: [facility] OR;  Service: Ophthalmology;  Laterality: Left;   PARS PLANA VITRECTOMY Left 11/10/2020   Procedure: PARS PLANA VITRECTOMY WITH 25 GAUGE;  Surgeon: Jarold Mayo, MD;  Location: [facility] OR;  Service: Ophthalmology;  Laterality: Left;   PARS PLANA VITRECTOMY Left 12/25/2020   Procedure: PARS PLANA VITRECTOMY WITH 25 GAUGE IN LEFT EYE;  Surgeon: Jarold Mayo, MD;  Location: [facility] OR;  Service: Ophthalmology;  Laterality: Left;   PERFLUORONE INJECTION Left 12/25/2020   Procedure: PERFLUORONE INJECTION;  Surgeon: Jarold Mayo, MD;   Location: [facility] OR;  Service: Ophthalmology;  Laterality: Left;   PHOTOCOAGULATION WITH LASER Left 11/10/2020   Procedure: PHOTOCOAGULATION WITH LASER;  Surgeon: Jarold Mayo, MD;  Location: [facility];  Service: Ophthalmology;  Laterality: Left;   removal of picc line     09-2021   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   Procedure: RETINECTOMY LEFT EYE;  Surgeon: Jarold Mayo, MD;  Location: [facility] OR;  Service: Ophthalmology;  Laterality: Left;   SILICON OIL REMOVAL Left 12/25/2020   Procedure: SILICONE OIL REMOVAL;  Surgeon: Jarold Mayo, MD;  Location: [facility] OR;  Service: Ophthalmology;  Laterality: Left;   TEE WITHOUT CARDIOVERSION N/A 05/29/2021   Procedure: TRANSESOPHAGEAL ECHOCARDIOGRAM (TEE);  Surgeon: Santo Stanly LABOR, MD;  Location: [facility];  Service: Cardiovascular;  Laterality: N/A;    FAMILY HISTORY: Family History  Problem Relation Age of Onset   CAD Other     SOCIAL HISTORY: Social History   Socioeconomic History   Marital status: Married    Spouse name: Not on file   Number of children: Not on file   Years of education: Not on file   Highest education level: Not on file  Occupational History   Not on file  Tobacco Use   Smoking status: Never    Passive exposure: Never   Smokeless tobacco: Never  Vaping Use   Vaping status: Never Used  Substance and Sexual Activity   Alcohol use: Not Currently   Drug use: Never   Sexual activity: Yes  Other Topics Concern   Not on file  Social History Narrative   Not on file   Social Drivers of Health   Financial Resource Strain: Low Risk  (01/24/2024)   Overall Financial Resource Strain (CARDIA)    Difficulty of Paying Living Expenses: Not hard at all  Food Insecurity: No Food Insecurity (01/24/2024)   Hunger Vital Sign    Worried About Running Out of Food in the Last Year: Never true    Ran Out of Food in the Last Year: Never true  Transportation Needs: No Transportation Needs  (01/24/2024)   PRAPARE - Transportation  Lack of Transportation (Medical): No    Lack of Transportation (Non-Medical): No  Physical Activity: Sufficiently Active (01/24/2024)   Exercise Vital Sign    Days of Exercise per Week: 4 days    Minutes of Exercise per Session: 40 min  Stress: No Stress Concern Present (01/24/2024)   [employer] of Occupational Health - Occupational Stress Questionnaire    Feeling of Stress : Not at all  Social Connections: Moderately Integrated (01/24/2024)   Social Connection and Isolation Panel    Frequency of Communication with Friends and Family: Twice a week    Frequency of Social Gatherings with Friends and Family: Three times a week    Attends Religious Services: More than 4 times per year    Active Member of Clubs or Organizations: No    Attends Banker Meetings: Never    Marital Status: Married  Catering manager Violence: Not At Risk (01/24/2024)   Humiliation, Afraid, Rape, and Kick questionnaire    Fear of Current or Ex-Partner: No    Emotionally Abused: No    Physically Abused: No    Sexually Abused: No     PHYSICAL EXAM  There were no vitals filed for this visit.  There is no height or weight on file to calculate BMI.  Generalized: Well developed, in no acute distress  Cardiology: normal rate and rhythm, no murmur noted Respiratory: clear to auscultation bilaterally  Neurological examination  Mentation: Alert oriented to time, place, history taking. Follows all commands speech and language fluent Cranial nerve II-XII: Pupils were equal round reactive to light. Extraocular movements were full, visual field were full  Motor: The motor testing reveals 5 over 5 strength of all 4 extremities. Good symmetric motor tone is noted throughout.  Gait and station: Gait is slightly wide and cautious, steady with cane.    DIAGNOSTIC DATA (LABS, IMAGING, TESTING) - I reviewed patient records, labs, notes, testing and imaging myself  where available.      No data to display           Lab Results  Component Value Date   WBC 7.4 03/16/2024   HGB 12.3 (L) 03/16/2024   HCT 37.6 03/16/2024   MCV 85 03/16/2024   PLT 230 03/16/2024      Component Value Date/Time   NA 144 08/15/2024 1016   K 4.0 08/15/2024 1016   CL 106 08/15/2024 1016   CO2 21 08/15/2024 1016   GLUCOSE 136 (H) 08/15/2024 1016   GLUCOSE 249 (H) 09/09/2023 1054   BUN 23 08/15/2024 1016   CREATININE 1.23 08/15/2024 1016   CREATININE 1.77 (H) 09/09/2023 1054   CALCIUM  9.0 08/15/2024 1016   PROT 6.0 01/20/2024 0937   ALBUMIN 4.0 01/20/2024 0937   AST 41 (H) 01/20/2024 0937   AST 40 09/09/2023 1054   ALT 38 01/20/2024 0937   ALT 50 (H) 09/09/2023 1054   ALKPHOS 90 01/20/2024 0937   BILITOT 0.6 01/20/2024 0937   BILITOT 0.6 09/09/2023 1054   GFRNONAA 39 (L) 09/09/2023 1054   Lab Results  Component Value Date   CHOL 114 01/20/2024   HDL 40 01/20/2024   LDLCALC 53 01/20/2024   TRIG 114 01/20/2024   CHOLHDL 2.9 01/20/2024   Lab Results  Component Value Date   HGBA1C 6.4 (A) 06/21/2024   HGBA1C 6.4 06/21/2024   HGBA1C 6.4 06/21/2024   HGBA1C 6.4 06/21/2024   Lab Results  Component Value Date   VITAMINB12 312 05/24/2021   Lab Results  Component Value Date   TSH 1.854 01/18/2022     ASSESSMENT AND PLAN 78 y.o. year old male  has a past medical history of Anginal pain, Coronary artery disease, diffuse large b cell lymphoma, DM type 2, Dysrhythmia, Easy bruising (06/18/2021), Endocarditis, Hematuria (06/18/2021), Hypertension, Sleep apnea, Uses walker (07/24/2021), UTI (urinary tract infection), and Wound of skin (10/20/2021). here with   No diagnosis found.    Adama BOYSIE BONEBRAKE is doing well on CPAP therapy. Compliance report reveals excellent compliance. AHI remains elevated at 14/hr, baseline 41/hr. Some central events noted. Pressure in the 95% of 11.8cmH20. We will increase max pressure from 12 to 14cmH20. I will recheck  download in 6-8 weeks. He was encouraged to continue using CPAP nightly and for greater than 4 hours each night. We will update supply orders as indicated. Risks of untreated sleep apnea review and education materials provided. Healthy lifestyle habits encouraged. He will follow up in 1 year, sooner if needed. He verbalizes understanding and agreement with this plan.    No orders of the defined types were placed in this encounter.    No orders of the defined types were placed in this encounter.     Greig Forbes, FNP-C 08/28/2024, 4:03 PM [facility] [address], [address] [place], [place] [address] [phone]

## 2024-08-28 NOTE — Telephone Encounter (Signed)
 Spoke with pts daughter regarding CT results. Pts daughter verbalized understanding. Pts daughter was advised to call our office if they had any further questions. Pts daughter thanked me for the call.

## 2024-08-29 NOTE — Progress Notes (Unsigned)
 SABRA

## 2024-08-30 ENCOUNTER — Encounter: Payer: Self-pay | Admitting: Family Medicine

## 2024-08-30 ENCOUNTER — Ambulatory Visit: Payer: Medicare HMO | Admitting: Family Medicine

## 2024-08-30 VITALS — BP 129/70 | HR 66 | Ht 68.0 in | Wt 242.0 lb

## 2024-08-30 DIAGNOSIS — G4733 Obstructive sleep apnea (adult) (pediatric): Secondary | ICD-10-CM

## 2024-08-30 NOTE — Patient Instructions (Signed)
 Please continue using your CPAP regularly. While your insurance requires that you use CPAP at least 4 hours each night on 70% of the nights, I recommend, that you not skip any nights and use it throughout the night if you can. Getting used to CPAP and staying with the treatment long term does take time and patience and discipline. Untreated obstructive sleep apnea when it is moderate to severe can have an adverse impact on cardiovascular health and raise her risk for heart disease, arrhythmias, hypertension, congestive heart failure, stroke and diabetes. Untreated obstructive sleep apnea causes sleep disruption, nonrestorative sleep, and sleep deprivation. This can have an impact on your day to day functioning and cause daytime sleepiness and impairment of cognitive function, memory loss, mood disturbance, and problems focussing. Using CPAP regularly can improve these symptoms.  We will update supply orders, today. I am going to order a titration study to try to find better pressure settings for you. You will hear back from our sleep lab to schedule.   Follow up pending titration study

## 2024-08-31 DIAGNOSIS — L97811 Non-pressure chronic ulcer of other part of right lower leg limited to breakdown of skin: Secondary | ICD-10-CM | POA: Diagnosis not present

## 2024-08-31 DIAGNOSIS — Z7901 Long term (current) use of anticoagulants: Secondary | ICD-10-CM | POA: Diagnosis not present

## 2024-08-31 DIAGNOSIS — I4891 Unspecified atrial fibrillation: Secondary | ICD-10-CM | POA: Diagnosis not present

## 2024-08-31 DIAGNOSIS — Z794 Long term (current) use of insulin: Secondary | ICD-10-CM | POA: Diagnosis not present

## 2024-08-31 DIAGNOSIS — E119 Type 2 diabetes mellitus without complications: Secondary | ICD-10-CM | POA: Diagnosis not present

## 2024-08-31 DIAGNOSIS — I872 Venous insufficiency (chronic) (peripheral): Secondary | ICD-10-CM | POA: Diagnosis not present

## 2024-08-31 DIAGNOSIS — I2510 Atherosclerotic heart disease of native coronary artery without angina pectoris: Secondary | ICD-10-CM | POA: Diagnosis not present

## 2024-08-31 DIAGNOSIS — Z7902 Long term (current) use of antithrombotics/antiplatelets: Secondary | ICD-10-CM | POA: Diagnosis not present

## 2024-08-31 DIAGNOSIS — I10 Essential (primary) hypertension: Secondary | ICD-10-CM | POA: Diagnosis not present

## 2024-09-07 ENCOUNTER — Telehealth: Payer: Self-pay | Admitting: Family Medicine

## 2024-09-07 DIAGNOSIS — I872 Venous insufficiency (chronic) (peripheral): Secondary | ICD-10-CM | POA: Diagnosis not present

## 2024-09-07 DIAGNOSIS — Z794 Long term (current) use of insulin: Secondary | ICD-10-CM | POA: Diagnosis not present

## 2024-09-07 DIAGNOSIS — I4891 Unspecified atrial fibrillation: Secondary | ICD-10-CM | POA: Diagnosis not present

## 2024-09-07 DIAGNOSIS — L97811 Non-pressure chronic ulcer of other part of right lower leg limited to breakdown of skin: Secondary | ICD-10-CM | POA: Diagnosis not present

## 2024-09-07 DIAGNOSIS — Z7902 Long term (current) use of antithrombotics/antiplatelets: Secondary | ICD-10-CM | POA: Diagnosis not present

## 2024-09-07 DIAGNOSIS — I2510 Atherosclerotic heart disease of native coronary artery without angina pectoris: Secondary | ICD-10-CM | POA: Diagnosis not present

## 2024-09-07 DIAGNOSIS — I10 Essential (primary) hypertension: Secondary | ICD-10-CM | POA: Diagnosis not present

## 2024-09-07 DIAGNOSIS — E119 Type 2 diabetes mellitus without complications: Secondary | ICD-10-CM | POA: Diagnosis not present

## 2024-09-07 DIAGNOSIS — Z7901 Long term (current) use of anticoagulants: Secondary | ICD-10-CM | POA: Diagnosis not present

## 2024-09-07 NOTE — Telephone Encounter (Signed)
 CPAP humana pending

## 2024-09-10 NOTE — Telephone Encounter (Signed)
Checked status still pending.  

## 2024-09-13 NOTE — Telephone Encounter (Signed)
Checked status still pending.  

## 2024-09-13 NOTE — Telephone Encounter (Signed)
 CPAP Mylene barrows: [id number] (exp. 09/07/24 to 11/14/24)

## 2024-09-14 DIAGNOSIS — I872 Venous insufficiency (chronic) (peripheral): Secondary | ICD-10-CM | POA: Diagnosis not present

## 2024-09-14 DIAGNOSIS — Z7902 Long term (current) use of antithrombotics/antiplatelets: Secondary | ICD-10-CM | POA: Diagnosis not present

## 2024-09-14 DIAGNOSIS — I10 Essential (primary) hypertension: Secondary | ICD-10-CM | POA: Diagnosis not present

## 2024-09-14 DIAGNOSIS — Z794 Long term (current) use of insulin: Secondary | ICD-10-CM | POA: Diagnosis not present

## 2024-09-14 DIAGNOSIS — E119 Type 2 diabetes mellitus without complications: Secondary | ICD-10-CM | POA: Diagnosis not present

## 2024-09-14 DIAGNOSIS — Z7901 Long term (current) use of anticoagulants: Secondary | ICD-10-CM | POA: Diagnosis not present

## 2024-09-14 DIAGNOSIS — I4891 Unspecified atrial fibrillation: Secondary | ICD-10-CM | POA: Diagnosis not present

## 2024-09-14 DIAGNOSIS — L97811 Non-pressure chronic ulcer of other part of right lower leg limited to breakdown of skin: Secondary | ICD-10-CM | POA: Diagnosis not present

## 2024-09-14 DIAGNOSIS — I2510 Atherosclerotic heart disease of native coronary artery without angina pectoris: Secondary | ICD-10-CM | POA: Diagnosis not present

## 2024-09-15 ENCOUNTER — Other Ambulatory Visit (HOSPITAL_BASED_OUTPATIENT_CLINIC_OR_DEPARTMENT_OTHER): Payer: Self-pay | Admitting: Family Medicine

## 2024-09-15 DIAGNOSIS — E1122 Type 2 diabetes mellitus with diabetic chronic kidney disease: Secondary | ICD-10-CM

## 2024-09-17 DIAGNOSIS — L97811 Non-pressure chronic ulcer of other part of right lower leg limited to breakdown of skin: Secondary | ICD-10-CM | POA: Diagnosis not present

## 2024-09-18 DIAGNOSIS — L97811 Non-pressure chronic ulcer of other part of right lower leg limited to breakdown of skin: Secondary | ICD-10-CM | POA: Diagnosis not present

## 2024-09-22 DIAGNOSIS — L97811 Non-pressure chronic ulcer of other part of right lower leg limited to breakdown of skin: Secondary | ICD-10-CM | POA: Diagnosis not present

## 2024-09-22 DIAGNOSIS — Z7901 Long term (current) use of anticoagulants: Secondary | ICD-10-CM | POA: Diagnosis not present

## 2024-09-22 DIAGNOSIS — Z794 Long term (current) use of insulin: Secondary | ICD-10-CM | POA: Diagnosis not present

## 2024-09-22 DIAGNOSIS — I872 Venous insufficiency (chronic) (peripheral): Secondary | ICD-10-CM | POA: Diagnosis not present

## 2024-09-22 DIAGNOSIS — I4891 Unspecified atrial fibrillation: Secondary | ICD-10-CM | POA: Diagnosis not present

## 2024-09-22 DIAGNOSIS — I2510 Atherosclerotic heart disease of native coronary artery without angina pectoris: Secondary | ICD-10-CM | POA: Diagnosis not present

## 2024-09-22 DIAGNOSIS — I10 Essential (primary) hypertension: Secondary | ICD-10-CM | POA: Diagnosis not present

## 2024-09-22 DIAGNOSIS — Z7902 Long term (current) use of antithrombotics/antiplatelets: Secondary | ICD-10-CM | POA: Diagnosis not present

## 2024-09-22 DIAGNOSIS — E119 Type 2 diabetes mellitus without complications: Secondary | ICD-10-CM | POA: Diagnosis not present

## 2024-09-27 ENCOUNTER — Other Ambulatory Visit: Payer: Self-pay | Admitting: *Deleted

## 2024-09-27 DIAGNOSIS — I83012 Varicose veins of right lower extremity with ulcer of calf: Secondary | ICD-10-CM

## 2024-09-28 DIAGNOSIS — I4891 Unspecified atrial fibrillation: Secondary | ICD-10-CM | POA: Diagnosis not present

## 2024-09-28 DIAGNOSIS — Z7902 Long term (current) use of antithrombotics/antiplatelets: Secondary | ICD-10-CM | POA: Diagnosis not present

## 2024-09-28 DIAGNOSIS — Z7901 Long term (current) use of anticoagulants: Secondary | ICD-10-CM | POA: Diagnosis not present

## 2024-09-28 DIAGNOSIS — E119 Type 2 diabetes mellitus without complications: Secondary | ICD-10-CM | POA: Diagnosis not present

## 2024-09-28 DIAGNOSIS — I10 Essential (primary) hypertension: Secondary | ICD-10-CM | POA: Diagnosis not present

## 2024-09-28 DIAGNOSIS — I2510 Atherosclerotic heart disease of native coronary artery without angina pectoris: Secondary | ICD-10-CM | POA: Diagnosis not present

## 2024-09-28 DIAGNOSIS — I872 Venous insufficiency (chronic) (peripheral): Secondary | ICD-10-CM | POA: Diagnosis not present

## 2024-09-28 DIAGNOSIS — L97811 Non-pressure chronic ulcer of other part of right lower leg limited to breakdown of skin: Secondary | ICD-10-CM | POA: Diagnosis not present

## 2024-09-28 DIAGNOSIS — Z794 Long term (current) use of insulin: Secondary | ICD-10-CM | POA: Diagnosis not present

## 2024-10-06 DIAGNOSIS — I2510 Atherosclerotic heart disease of native coronary artery without angina pectoris: Secondary | ICD-10-CM | POA: Diagnosis not present

## 2024-10-06 DIAGNOSIS — I4891 Unspecified atrial fibrillation: Secondary | ICD-10-CM | POA: Diagnosis not present

## 2024-10-06 DIAGNOSIS — Z794 Long term (current) use of insulin: Secondary | ICD-10-CM | POA: Diagnosis not present

## 2024-10-06 DIAGNOSIS — I872 Venous insufficiency (chronic) (peripheral): Secondary | ICD-10-CM | POA: Diagnosis not present

## 2024-10-06 DIAGNOSIS — E119 Type 2 diabetes mellitus without complications: Secondary | ICD-10-CM | POA: Diagnosis not present

## 2024-10-06 DIAGNOSIS — Z7901 Long term (current) use of anticoagulants: Secondary | ICD-10-CM | POA: Diagnosis not present

## 2024-10-06 DIAGNOSIS — I10 Essential (primary) hypertension: Secondary | ICD-10-CM | POA: Diagnosis not present

## 2024-10-06 DIAGNOSIS — L97811 Non-pressure chronic ulcer of other part of right lower leg limited to breakdown of skin: Secondary | ICD-10-CM | POA: Diagnosis not present

## 2024-10-06 DIAGNOSIS — Z7902 Long term (current) use of antithrombotics/antiplatelets: Secondary | ICD-10-CM | POA: Diagnosis not present

## 2024-10-15 ENCOUNTER — Telehealth: Payer: Self-pay

## 2024-10-15 DIAGNOSIS — I2510 Atherosclerotic heart disease of native coronary artery without angina pectoris: Secondary | ICD-10-CM | POA: Diagnosis not present

## 2024-10-15 DIAGNOSIS — I872 Venous insufficiency (chronic) (peripheral): Secondary | ICD-10-CM | POA: Diagnosis not present

## 2024-10-15 DIAGNOSIS — Z7901 Long term (current) use of anticoagulants: Secondary | ICD-10-CM | POA: Diagnosis not present

## 2024-10-15 DIAGNOSIS — Z794 Long term (current) use of insulin: Secondary | ICD-10-CM | POA: Diagnosis not present

## 2024-10-15 DIAGNOSIS — I10 Essential (primary) hypertension: Secondary | ICD-10-CM | POA: Diagnosis not present

## 2024-10-15 DIAGNOSIS — E119 Type 2 diabetes mellitus without complications: Secondary | ICD-10-CM | POA: Diagnosis not present

## 2024-10-15 DIAGNOSIS — L97811 Non-pressure chronic ulcer of other part of right lower leg limited to breakdown of skin: Secondary | ICD-10-CM | POA: Diagnosis not present

## 2024-10-15 DIAGNOSIS — I4891 Unspecified atrial fibrillation: Secondary | ICD-10-CM | POA: Diagnosis not present

## 2024-10-15 DIAGNOSIS — Z7902 Long term (current) use of antithrombotics/antiplatelets: Secondary | ICD-10-CM | POA: Diagnosis not present

## 2024-10-15 NOTE — Telephone Encounter (Signed)
 Ellouise, RN from [facility] called asking for verbal order for continued wound care.  Verbal order given for continued wound care 2x/wk week of 12/1, 1x/wk week of 12/8 and 1x/wk week of 12/15.

## 2024-10-17 ENCOUNTER — Encounter: Payer: Self-pay | Admitting: Vascular Surgery

## 2024-10-17 ENCOUNTER — Other Ambulatory Visit: Payer: Self-pay | Admitting: *Deleted

## 2024-10-17 DIAGNOSIS — I4891 Unspecified atrial fibrillation: Secondary | ICD-10-CM | POA: Diagnosis not present

## 2024-10-17 DIAGNOSIS — I872 Venous insufficiency (chronic) (peripheral): Secondary | ICD-10-CM | POA: Diagnosis not present

## 2024-10-17 DIAGNOSIS — E119 Type 2 diabetes mellitus without complications: Secondary | ICD-10-CM | POA: Diagnosis not present

## 2024-10-17 DIAGNOSIS — I10 Essential (primary) hypertension: Secondary | ICD-10-CM | POA: Diagnosis not present

## 2024-10-17 DIAGNOSIS — Z7902 Long term (current) use of antithrombotics/antiplatelets: Secondary | ICD-10-CM | POA: Diagnosis not present

## 2024-10-17 DIAGNOSIS — Z7901 Long term (current) use of anticoagulants: Secondary | ICD-10-CM | POA: Diagnosis not present

## 2024-10-17 DIAGNOSIS — L97811 Non-pressure chronic ulcer of other part of right lower leg limited to breakdown of skin: Secondary | ICD-10-CM | POA: Diagnosis not present

## 2024-10-17 DIAGNOSIS — Z794 Long term (current) use of insulin: Secondary | ICD-10-CM | POA: Diagnosis not present

## 2024-10-17 DIAGNOSIS — I2510 Atherosclerotic heart disease of native coronary artery without angina pectoris: Secondary | ICD-10-CM | POA: Diagnosis not present

## 2024-10-17 MED ORDER — LORAZEPAM 1 MG PO TABS: ORAL_TABLET | ORAL | 0 refills | Status: AC

## 2024-10-18 NOTE — Telephone Encounter (Signed)
 Patient is scheduled at [facility] for 12/05/2024 at 8 pm  Mailed packet  And will redo the auth once the appt gets closer

## 2024-10-19 DIAGNOSIS — I10 Essential (primary) hypertension: Secondary | ICD-10-CM | POA: Diagnosis not present

## 2024-10-19 DIAGNOSIS — I2510 Atherosclerotic heart disease of native coronary artery without angina pectoris: Secondary | ICD-10-CM | POA: Diagnosis not present

## 2024-10-19 DIAGNOSIS — E119 Type 2 diabetes mellitus without complications: Secondary | ICD-10-CM | POA: Diagnosis not present

## 2024-10-19 DIAGNOSIS — Z794 Long term (current) use of insulin: Secondary | ICD-10-CM | POA: Diagnosis not present

## 2024-10-19 DIAGNOSIS — L97811 Non-pressure chronic ulcer of other part of right lower leg limited to breakdown of skin: Secondary | ICD-10-CM | POA: Diagnosis not present

## 2024-10-19 DIAGNOSIS — Z7902 Long term (current) use of antithrombotics/antiplatelets: Secondary | ICD-10-CM | POA: Diagnosis not present

## 2024-10-19 DIAGNOSIS — I4891 Unspecified atrial fibrillation: Secondary | ICD-10-CM | POA: Diagnosis not present

## 2024-10-19 DIAGNOSIS — Z7901 Long term (current) use of anticoagulants: Secondary | ICD-10-CM | POA: Diagnosis not present

## 2024-10-19 DIAGNOSIS — I872 Venous insufficiency (chronic) (peripheral): Secondary | ICD-10-CM | POA: Diagnosis not present

## 2024-10-22 ENCOUNTER — Ambulatory Visit (HOSPITAL_BASED_OUTPATIENT_CLINIC_OR_DEPARTMENT_OTHER): Admitting: Family Medicine

## 2024-10-25 ENCOUNTER — Encounter: Payer: Self-pay | Admitting: Vascular Surgery

## 2024-10-25 ENCOUNTER — Ambulatory Visit: Attending: Vascular Surgery | Admitting: Vascular Surgery

## 2024-10-25 DIAGNOSIS — I83012 Varicose veins of right lower extremity with ulcer of calf: Secondary | ICD-10-CM

## 2024-10-25 DIAGNOSIS — L97211 Non-pressure chronic ulcer of right calf limited to breakdown of skin: Secondary | ICD-10-CM

## 2024-10-25 HISTORY — PX: LASER ABLATION: SHX1947

## 2024-10-25 NOTE — Progress Notes (Signed)
 FRIEDRICH HARRIOTT                                          MRN: [id number]   10/25/2024   The VBCI Quality Team Specialist reviewed this patient medical record for the purposes of chart review for care gap closure. The following were reviewed: abstraction for care gap closure-controlling blood pressure.    VBCI Quality Team

## 2024-10-25 NOTE — Progress Notes (Addendum)
° ° °   Laser Ablation Procedure    Date: 10/25/2024 [name] DOB:1946/06/27 Consent signed: Yes     Surgeon: Dr. Penne [place] Procedure: Laser Ablation: right Greater Saphenous Vein BP 138/65 (BP Location: Right Arm, Patient Position: Sitting, Cuff Size: Large)   Pulse 65   Temp 97.9 F (36.6 C) (Temporal)   Resp 18   Ht 5' 10 (1.778 m)   Wt 240 lb (108.9 kg)   SpO2 97%   BMI 34.44 kg/m  Tumescent Anesthesia: 300 cc 0.9% NaCl with 50 cc Lidocaine  HCL 1%  and 15 cc 8.4% NaHCO3 Local Anesthesia: 4 cc Lidocaine  HCL and NaHCO3 (ratio 2:1) 7 watts continuous mode    Total energy: 1408.2 joules    Total time: 201 seconds Treatment Length 28 cm  Laser Fiber Ref. # [id number]        Lot # [id number]   Patient tolerated procedure well Notes: .All staff members wore facial masks and facial shields/goggles.  Pt refused ativan  prior to surgical procedure. [place] interpreter Dick ID # [phone] was used though out the entire case   Description of Procedure:  After marking the course of the secondary varicosities, the patient was placed on the operating table in the supine position, and the right leg was prepped and draped in sterile fashion.   Local anesthetic was administered and under ultrasound guidance the saphenous vein was accessed with a micro needle and guide wire; then the mirco puncture sheath was placed.  A guide wire was inserted saphenofemoral junction , followed by a 5 french sheath.  The position of the sheath and then the laser fiber below the junction was confirmed using the ultrasound.  Tumescent anesthesia was administered along the course of the saphenous vein using ultrasound guidance. The patient was placed in Trendelenburg position and protective laser glasses were placed on patient and staff, and the laser was fired at 7 watts continuous mode for a total of 1408.2 joules.  Steri strips were applied to the insertion site  and ABD pads and ace wrap was applied for compression  to the upper thigh . Una boot was applied to the lower calf . Blood loss was less than 15 cc.  Discharge instructions reviewed with patient and hardcopy of discharge instructions given to patient to take home. The patient a out of the operating room having tolerated the procedure well.

## 2024-10-25 NOTE — Progress Notes (Signed)
 Patient name: [name] MRN: [id number] DOB: 1946-03-23 Sex: male  REASON FOR VISIT: Treatment of right lower extremity chronic venous insufficiency  HPI: [name] is a 78 y.o. male history of chronic  venous insufficiency right lower extremity has required Unna boot now with healed ulceration.  He is here today for treatment of right greater saphenous vein venous reflux to prevent ulcer recurrence.  Eliquis  has been held for 2 days.  Current Outpatient Medications  Medication Sig Dispense Refill   amiodarone  (PACERONE ) 200 MG tablet Take 1 tablet (200 mg total) by mouth daily. 90 tablet 3   amLODipine  (NORVASC ) 10 MG tablet Take 1 tablet by mouth once daily 90 tablet 0   apixaban  (ELIQUIS ) 5 MG TABS tablet Take 1 tablet by mouth twice daily 180 tablet 1   clopidogrel  (PLAVIX ) 75 MG tablet Take 1 tablet by mouth once daily 90 tablet 1   clotrimazole  (LOTRIMIN ) 1 % cream Apply 1 Application topically 2 (two) times daily. 30 g 0   ferrous sulfate  325 (65 FE) MG tablet Take 1 tablet (325 mg total) by mouth daily with breakfast. Please take with a source of Vitamin C (Patient not taking: Reported on 08/30/2024) 90 tablet 3   finasteride  (PROSCAR ) 5 MG tablet TAKE 1 TABLET BY MOUTH AT BEDTIME 90 tablet 0   Insulin  Pen Needle (NOVOFINE) 30G X 8 MM MISC Inject 10 each into the skin as needed. 100 each 6   isosorbide  mononitrate (IMDUR ) 30 MG 24 hr tablet Take 30 mg by mouth every morning. (Patient not taking: Reported on 08/30/2024)     LANTUS  SOLOSTAR 100 UNIT/ML Solostar Pen INJECT 34 UNITS SUBCUTANEOUSLY AT BEDTIME 15 mL 1   LORazepam  (ATIVAN ) 1 MG tablet Take 2 tablets 30 to 60 minutes prior to leaving house on day of office surgery.  Bring third tablet with you to office on day of office surgery. 3 tablet 0   metFORMIN  (GLUCOPHAGE ) 1000 MG tablet Take 1 tablet (1,000 mg total) by mouth 2 (two) times daily with a meal. 180 tablet 1   metoprolol  tartrate (LOPRESSOR ) 50 MG tablet  Take 1 tablet (50 mg total) by mouth 2 (two) times daily. 180 tablet 3   MOUNJARO 2.5 MG/0.5ML Pen Inject 2.5 mg into the skin once a week. (Patient not taking: Reported on 08/30/2024)     nitroGLYCERIN (NITROSTAT) 0.3 MG SL tablet Place 0.3 mg under the tongue every 5 (five) minutes x 3 doses as needed for chest pain. (Patient not taking: Reported on 08/30/2024)     pregabalin  (LYRICA ) 50 MG capsule Take 1 capsule (50 mg total) by mouth daily. 90 capsule 1   rosuvastatin  (CRESTOR ) 10 MG tablet Take 1 tablet (10 mg total) by mouth daily. 90 tablet 3   torsemide  (DEMADEX ) 20 MG tablet Take 2 tablets (40 mg total) by mouth daily. 180 tablet 3   No current facility-administered medications for this visit.    PHYSICAL EXAM: Vitals:   10/25/24 1122  BP: 138/65  Pulse: 65  Resp: 18  Temp: 97.9 F (36.6 C)  SpO2: 97%    Awake alert and oriented Nonlabored respirations Right lower extremity skin unchanged no current ulceration   PROCEDURE: Right greater saphenous vein laser ablation currently 28 cm  TECHNIQUE: [name] was taken the procedure room where consent was performed he was then sterilely prepped and draped in the right lower extremity timeout was called.  He was sterilely prepped and draped in  the right lower extremity in usual fashion.  The right greater saphenous vein was evaluated with ultrasound and around the knee we anesthetized 1% lidocaine  and the veins cannulated with a micropuncture needle followed by wire and a sheath.  A J-wire was then placed 3 cm proximal to the saphenofemoral junction under ultrasound guidance.  A laser reducer sheath was then placed and the laser was placed a total of 28 cm through the sheath.  Tumescent anesthesia was instilled along the course of the laser catheter.  The vein was then ablated for a total of 28 cm and a completion of the right common femoral vein was noted to be patent and compressible.  Steri-Strip was then applied to the access site  and a Unna boot was placed.  He tolerated the procedure without immediate complication.  Plan will be for follow-up in 2 to 3 weeks with postablation duplex.  [place] [facility] [phone]

## 2024-10-30 ENCOUNTER — Other Ambulatory Visit (HOSPITAL_BASED_OUTPATIENT_CLINIC_OR_DEPARTMENT_OTHER): Payer: Self-pay | Admitting: Family Medicine

## 2024-11-02 ENCOUNTER — Telehealth: Payer: Self-pay

## 2024-11-02 ENCOUNTER — Other Ambulatory Visit: Payer: Self-pay | Admitting: Vascular Surgery

## 2024-11-02 ENCOUNTER — Other Ambulatory Visit: Payer: Self-pay | Admitting: Nurse Practitioner

## 2024-11-02 ENCOUNTER — Other Ambulatory Visit (INDEPENDENT_AMBULATORY_CARE_PROVIDER_SITE_OTHER): Admitting: Physician Assistant

## 2024-11-02 DIAGNOSIS — I83018 Varicose veins of right lower extremity with ulcer other part of lower leg: Secondary | ICD-10-CM

## 2024-11-02 DIAGNOSIS — I83012 Varicose veins of right lower extremity with ulcer of calf: Secondary | ICD-10-CM

## 2024-11-02 DIAGNOSIS — L97211 Non-pressure chronic ulcer of right calf limited to breakdown of skin: Secondary | ICD-10-CM

## 2024-11-02 NOTE — Telephone Encounter (Signed)
 Entered in error

## 2024-11-02 NOTE — Progress Notes (Signed)
" ° °  Mr. [name] is a 78 year old male with venous insufficiency and right lower extremity venous ulcerations.  He underwent right greater saphenous vein ablation by Dr. Sheree on 10/25/2024.  An Unna boot was applied after surgery.  Home health had been changing his Unna boot on a weekly basis.  He will have 1 additional home health nurse assessment of the venous ulceration as well as the ablation access site.  He will then follow-up in the office at the end of the month with a post ablation duplex.  Donnice Sender, PA-C [facility] [phone] 11/02/2024  2:34 PM  "

## 2024-11-03 ENCOUNTER — Other Ambulatory Visit: Payer: Self-pay | Admitting: Nurse Practitioner

## 2024-11-14 ENCOUNTER — Telehealth: Payer: Self-pay

## 2024-11-14 ENCOUNTER — Ambulatory Visit (HOSPITAL_COMMUNITY)
Admission: RE | Admit: 2024-11-14 | Discharge: 2024-11-14 | Disposition: A | Discharge status: Home / Self Care | Source: Ambulatory Visit | Attending: Surgery | Admitting: Surgery

## 2024-11-14 ENCOUNTER — Encounter: Payer: Self-pay | Admitting: Vascular Surgery

## 2024-11-14 ENCOUNTER — Ambulatory Visit (INDEPENDENT_AMBULATORY_CARE_PROVIDER_SITE_OTHER): Admitting: Vascular Surgery

## 2024-11-14 VITALS — BP 130/67 | HR 60 | Temp 98.2°F | Ht 70.0 in | Wt 245.0 lb

## 2024-11-14 DIAGNOSIS — L97219 Non-pressure chronic ulcer of right calf with unspecified severity: Secondary | ICD-10-CM | POA: Diagnosis not present

## 2024-11-14 DIAGNOSIS — I83018 Varicose veins of right lower extremity with ulcer other part of lower leg: Secondary | ICD-10-CM | POA: Insufficient documentation

## 2024-11-14 DIAGNOSIS — I83012 Varicose veins of right lower extremity with ulcer of calf: Secondary | ICD-10-CM | POA: Diagnosis not present

## 2024-11-14 DIAGNOSIS — L97811 Non-pressure chronic ulcer of other part of right lower leg limited to breakdown of skin: Secondary | ICD-10-CM

## 2024-11-14 NOTE — Progress Notes (Signed)
 "  Patient ID: [name], male   DOB: 1946-05-15, 78 y.o.   MRN: [id number]  Reason for Consult: Routine Post Op   Referred by de Cuba, Raymond J, MD  Subjective:     HPI:  [name] is a 78 y.o. male status post right greater saphenous vein ablation for ulceration of the right lower extremity.  He has been in [employer] boot's which are scheduled to be changed today and states that he has very minimal ulceration remaining and the swelling is much improved in the right lower extremity.  He has no complaints related to today's visit.  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 per dr fleeta dam note 06-18-2021   Hematuria 06/18/2021   Hypertension    Sleep apnea    uses cpap   Uses walker 07/24/2021   prn or cane   UTI (urinary tract infection)    finished antibiotics on 07-08-2021   Wound of skin 10/20/2021   sees wound care center for multiple dressing changes   Family History  Problem Relation Age of Onset   CAD Other    Past Surgical History:  Procedure Laterality Date   CARDIAC CATHETERIZATION     CYSTOSCOPY W/ URETERAL STENT PLACEMENT Right 07/28/2021   Procedure: CYSTOSCOPY WITH RETROGRADE PYELOGRAM/URETERAL STENT EXCHANGE;  Surgeon: Rosalind Zachary NOVAK, MD;  Location: [facility];  Service: Urology;  Laterality: Right;  30 MINS   CYSTOSCOPY W/ URETERAL STENT PLACEMENT Right 10/27/2021   Procedure: CYSTOSCOPY WITH RETROGRADE PYELOGRAM/ URETERAL STENT REMOVAL;  Surgeon: Rosalind Zachary NOVAK, MD;  Location: [facility];  Service: Urology;  Laterality: Right;   CYSTOSCOPY WITH RETROGRADE PYELOGRAM, URETEROSCOPY AND STENT PLACEMENT Right  05/01/2021   Procedure: CYSTOSCOPY WITH RIGHT  RETROGRADE PYELOGRAM,  AND RIGHT STENT PLACEMENT;  Surgeon: Nieves Cough, MD;  Location: [facility];  Service: Urology;  Laterality: Right;   EYE SURGERY     INJECTION OF SILICONE OIL Left 11/10/2020   Procedure: INJECTION OF SILICONE OIL;  Surgeon: Jarold Mayo, MD;  Location: [facility] OR;  Service: Ophthalmology;  Laterality: Left;   INJECTION OF SILICONE OIL Left 12/25/2020   Procedure: INJECTION OF SILICONE OIL;  Surgeon: Jarold Mayo, MD;  Location: [facility] OR;  Service: Ophthalmology;  Laterality: Left;   JOINT REPLACEMENT Right    hip   LASER ABLATION Right 10/25/2024   laser abaltion of the right greater saphenous vein   LASER PHOTO ABLATION Left 12/25/2020   Procedure: LASER PHOTO ABLATION;  Surgeon: Jarold Mayo, MD;  Location: [facility] OR;  Service: Ophthalmology;  Laterality: Left;   PARS PLANA VITRECTOMY Left 11/10/2020   Procedure: PARS PLANA VITRECTOMY WITH 25 GAUGE;  Surgeon: Jarold Mayo, MD;  Location: [facility] OR;  Service: Ophthalmology;  Laterality: Left;   PARS PLANA VITRECTOMY Left 12/25/2020   Procedure: PARS PLANA VITRECTOMY WITH 25 GAUGE IN LEFT EYE;  Surgeon: Jarold Mayo, MD;  Location: [facility] OR;  Service: Ophthalmology;  Laterality: Left;   PERFLUORONE INJECTION Left 12/25/2020   Procedure: PERFLUORONE INJECTION;  Surgeon: Jarold Mayo, MD;  Location: [facility] OR;  Service: Ophthalmology;  Laterality:  Left;   PHOTOCOAGULATION WITH LASER Left 11/10/2020   Procedure: PHOTOCOAGULATION WITH LASER;  Surgeon: Jarold Mayo, MD;  Location: [facility] OR;  Service: Ophthalmology;  Laterality: Left;   removal of picc line     09-2021   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   Procedure: RETINECTOMY LEFT EYE;  Surgeon: Jarold Mayo, MD;  Location[facility] OR;  Service: Ophthalmology;  Laterality: Left;   SILICON OIL REMOVAL Left 12/25/2020   Procedure: SILICONE OIL REMOVAL;  Surgeon: Jarold Mayo, MD;  Location: [facility] OR;  Service:  Ophthalmology;  Laterality: Left;   TEE WITHOUT CARDIOVERSION N/A 05/29/2021   Procedure: TRANSESOPHAGEAL ECHOCARDIOGRAM (TEE);  Surgeon: Santo Stanly LABOR, MD;  Location: [facility];  Service: Cardiovascular;  Laterality: N/A;    Short Social History:  Social History   Tobacco Use   Smoking status: Never    Passive exposure: Never   Smokeless tobacco: Never  Substance Use Topics   Alcohol use: Not Currently    Allergies[1]  Current Outpatient Medications  Medication Sig Dispense Refill   amiodarone  (PACERONE ) 200 MG tablet Take 1 tablet (200 mg total) by mouth daily. 90 tablet 3   amLODipine  (NORVASC ) 10 MG tablet Take 1 tablet by mouth once daily 90 tablet 0   apixaban  (ELIQUIS ) 5 MG TABS tablet Take 1 tablet by mouth twice daily 180 tablet 1   clopidogrel  (PLAVIX ) 75 MG tablet Take 1 tablet by mouth once daily 90 tablet 0   clotrimazole  (LOTRIMIN ) 1 % cream Apply 1 Application topically 2 (two) times daily. 30 g 0   ferrous sulfate  325 (65 FE) MG tablet Take 1 tablet (325 mg total) by mouth daily with breakfast. Please take with a source of Vitamin C 90 tablet 3   finasteride  (PROSCAR ) 5 MG tablet TAKE 1 TABLET BY MOUTH AT BEDTIME 90 tablet 0   Insulin  Pen Needle (NOVOFINE) 30G X 8 MM MISC Inject 10 each into the skin as needed. 100 each 6   isosorbide  mononitrate (IMDUR ) 30 MG 24 hr tablet Take 30 mg by mouth every morning.     LANTUS  SOLOSTAR 100 UNIT/ML Solostar Pen INJECT 34 UNITS SUBCUTANEOUSLY AT BEDTIME 15 mL 1   LORazepam  (ATIVAN ) 1 MG tablet Take 2 tablets 30 to 60 minutes prior to leaving house on day of office surgery.  Bring third tablet with you to office on day of office surgery. 3 tablet 0   metFORMIN  (GLUCOPHAGE ) 1000 MG tablet Take 1 tablet (1,000 mg total) by mouth 2 (two) times daily with a meal. 180 tablet 1   metoprolol  tartrate (LOPRESSOR ) 50 MG tablet Take 1 tablet (50 mg total) by mouth 2 (two) times daily. 180 tablet 3   MOUNJARO 2.5 MG/0.5ML Pen  Inject 2.5 mg into the skin once a week.     nitroGLYCERIN (NITROSTAT) 0.3 MG SL tablet Place 0.3 mg under the tongue every 5 (five) minutes x 3 doses as needed for chest pain.     pregabalin  (LYRICA ) 50 MG capsule Take 1 capsule (50 mg total) by mouth daily. 90 capsule 1   rosuvastatin  (CRESTOR ) 10 MG tablet Take 1 tablet by mouth once daily 90 tablet 0   torsemide  (DEMADEX ) 20 MG tablet Take 2 tablets (40 mg total) by mouth daily. 180 tablet 3   No current facility-administered medications for this visit.    Review of Systems  Constitutional:  Constitutional negative. HENT: HENT negative.  Eyes: Eyes negative.  Respiratory: Respiratory negative.  Cardiovascular: Cardiovascular negative.  GI: Gastrointestinal negative.  Musculoskeletal: Musculoskeletal negative.  Skin: Skin negative.  Neurological: Neurological negative. Hematologic: Hematologic/lymphatic negative.  Psychiatric: Psychiatric negative.        Objective:  Objective   Vitals:   11/14/24 1108  BP: 130/67  Pulse: 60  Temp: 98.2 F (36.8 C)  SpO2: 93%  Weight: 245 lb (111.1 kg)  Height: 5' 10 (1.778 m)   Body mass index is 35.15 kg/m.  Physical Exam HENT:     Head: Normocephalic.     Nose: Nose normal.  Eyes:     Pupils: Pupils are equal, round, and reactive to light.  Cardiovascular:     Rate and Rhythm: Normal rate.  Musculoskeletal:     Right lower leg: No edema.     Left lower leg: No edema.     Comments: Unna boots bilateral lower extremities, right lower extremity edema significantly lessened from previous exam  Skin:    Capillary Refill: Capillary refill takes less than 2 seconds.  Neurological:     General: No focal deficit present.     Mental Status: He is alert.     Data: Venous Reflux Times  +---------+---------+------+-----------+------------+--------+  RIGHT   Reflux NoRefluxReflux TimeDiameter cmsComments                     Yes                                    +---------+---------+------+-----------+------------+--------+  CFV                                           Patent    +---------+---------+------+-----------+------------+--------+  FV mid                                         Patent    +---------+---------+------+-----------+------------+--------+  Popliteal                                     Patent    +---------+---------+------+-----------+------------+--------+         Summary:  Right:  - No evidence of deep vein thrombosis from the common femoral through the  popliteal veins.  - Successful ablation of the great saphenous vein from the distal thigh up  to approximately 2.81 cm from the SFJ.         Assessment/Plan:     78 year old male status post right greater saphenous vein ablation for ulceration with edema.  Edema much improved with Unna boot on today.  Will continue outpatient Unna boot changes until wounds have healed and transition to at least knee-high compression at that time.  He can see us  on an as-needed basis.     Penne Lonni Colorado MD [facility]      [1] No Known Allergies  "

## 2024-11-14 NOTE — Telephone Encounter (Signed)
 Bernarda from [facility] called asking for verbal order.  Verbal order given for skilled nursing.  Patient to be discharged from care 11/14/24 and re-admitted 11/15/2024 with new insurance coverage.  Skilled nursing to change pt's bilateral UNNA Boots 1 more time.  The patient will wear the new UNNA Boots for one week.  Skilled nursing to remove pt's Sungard and fit patient with compression stockings.  Patient OK to be discharged from [facility] after fitting with compression stockings.

## 2024-11-16 ENCOUNTER — Encounter: Payer: Self-pay | Admitting: Vascular Surgery

## 2024-11-20 NOTE — Telephone Encounter (Signed)
 CPAP [employer] pending new auth

## 2024-11-21 ENCOUNTER — Encounter: Payer: Self-pay | Admitting: Vascular Surgery

## 2024-11-26 NOTE — Telephone Encounter (Signed)
 Updated insurance auth:  CPAP Mylene barrows: [id number] (exp. 11/20/24 to 02/12/25)   Patient is scheduled at [facility] for 12/05/24 at 8 pm

## 2024-11-28 ENCOUNTER — Telehealth: Payer: Self-pay

## 2024-11-28 NOTE — Telephone Encounter (Signed)
 Ellouise, RN [facility] called reporting patient has new 3x4 cm venous wound on right lower leg and several smaller wounds on that same leg.  Bilateral pitting edema of lower extremities.  Verbal order given to cover ulcers with xeroform and continue UNNA Boots.  Will reach out to Dr. Sheree re: continued care.

## 2024-11-30 ENCOUNTER — Encounter: Payer: Self-pay | Admitting: Vascular Surgery

## 2024-12-05 ENCOUNTER — Ambulatory Visit: Admitting: Neurology

## 2024-12-05 DIAGNOSIS — G4720 Circadian rhythm sleep disorder, unspecified type: Secondary | ICD-10-CM

## 2024-12-05 DIAGNOSIS — Z9989 Dependence on other enabling machines and devices: Secondary | ICD-10-CM

## 2024-12-05 DIAGNOSIS — G4733 Obstructive sleep apnea (adult) (pediatric): Secondary | ICD-10-CM | POA: Diagnosis not present

## 2024-12-05 DIAGNOSIS — R9431 Abnormal electrocardiogram [ECG] [EKG]: Secondary | ICD-10-CM

## 2024-12-05 DIAGNOSIS — G4737 Central sleep apnea in conditions classified elsewhere: Secondary | ICD-10-CM

## 2024-12-11 ENCOUNTER — Other Ambulatory Visit: Payer: Self-pay | Admitting: Internal Medicine

## 2024-12-11 ENCOUNTER — Other Ambulatory Visit (HOSPITAL_BASED_OUTPATIENT_CLINIC_OR_DEPARTMENT_OTHER): Payer: Self-pay | Admitting: Family Medicine

## 2024-12-11 DIAGNOSIS — E1122 Type 2 diabetes mellitus with diabetic chronic kidney disease: Secondary | ICD-10-CM

## 2024-12-11 DIAGNOSIS — I480 Paroxysmal atrial fibrillation: Secondary | ICD-10-CM

## 2024-12-12 NOTE — Procedures (Signed)
 Physician Interpretation: Please see link under Procedure Tab or under Encounters tab for physician report, technical report, as well as O2 titration and/or PAP titration tables (if applicable).

## 2024-12-14 ENCOUNTER — Telehealth: Payer: Self-pay

## 2024-12-14 NOTE — Telephone Encounter (Signed)
 Ellouise, RN with [facility] called to report a new ulcer on pt's Rt shin.  The ulcer measures 6x5x0.1 cm, is red but not bleeding.  Thursday evening (12/13/24), patient began complaining that his rt leg under his NONIE Seaman was very painful.  Pt's wife unwrapped the [facility] and this new red ulcer was present. Wife reported to Ellouise that pt had been sitting directly in front of the fireplace for about an hour before the leg started hurting.  Tina cleaned the wound and covered with Xeroform, gauze and ace wrap.  She did not replace the [facility].  Ellouise reported the patient's rt leg looked normal other than this new ulcer.    Ellouise stated that pt's wife said she couldn't bring pt to our office today. Myles Ellouise to have patient go to urgent care or PC for wound assessment.

## 2024-12-17 ENCOUNTER — Ambulatory Visit: Payer: Self-pay

## 2024-12-17 ENCOUNTER — Ambulatory Visit: Payer: Self-pay | Admitting: Family Medicine

## 2024-12-17 DIAGNOSIS — G4730 Sleep apnea, unspecified: Secondary | ICD-10-CM

## 2024-12-17 NOTE — Telephone Encounter (Signed)
 FYI Only or Action Required?: Action required by provider: clinical question for provider.  Patient was last seen in primary care on 06/21/2024 by de Cuba, Quintin PARAS, MD.  Called Nurse Triage reporting Wound Infection.  Symptoms began a week ago.  Interventions attempted: Rest, hydration, or home remedies.  Symptoms are: gradually worsening.  Triage Disposition: See HCP Within 4 Hours (Or PCP Triage)  Patient/caregiver understands and will follow disposition?: No, wishes to speak with PCP     Reason for Disposition  [1] Skin around the wound has become red AND [2] larger than 2 inches (5 cm)  Answer Assessment - Initial Assessment Questions Ellouise , nurse at [facility]  reports not currently with patient but had cellutlis right leg had ablation by Dr Gari was going smooth until last  week, a lot of pain right leg spouse took dressing off large area  12 cmx 6 cm wide draining serosanguinous fluid ,Still complaining of a lot of pain  about the same not worse since checking dressing.  no fever. But leg warm to touch.  got pictures from wife and redness spreading up towards the knee. Wants to get antibiotics  concerns this is now infected. . Daughter said could go get it after work. Advised Ellouise will call daughter to see if can do virtual visit or UC visit. Ellouise Dollar  Per chart review patient was seen in office 1/30 vascular surgery office for ablation.   Call placed to patient daughter, reviewed above with patients daughter patient daughter reports not able to bring patient to UC or do virtual visit ,wants  to know if on call can prescribe to [facility] patient does have appointment Thursday that daughter will bring pt too.. This RN calling On call now to discuss further.    Called on call Dr Alvia was recommended to advise urgent care for evaluation and treatment , called daughter back advised of this and who reported barrier to get to UC is working  until 5 and  wants message sent to PCP for review when back but does agree ER with worsening symptoms including but not limited to fever, serve pain vomiting   1. LOCATION: Where is the wound located?      Right left  2. WOUND APPEARANCE: What does the wound look like?      Spreading redness to knee left side drainage   4. SPREAD: What's changed in the last day?  Do you see any red streaks coming from the wound?     Yes redness spreading now streaks 5. ONSET: When did it start to look infected?      Last week   8. FEVER: Do you have a fever? If Yes, ask: What is your temperature, how was it measured, and when did it start?     Denies  9. OTHER SYMPTOMS: Do you have any other symptoms? (e.g., shaking chills, weakness, rash elsewhere on body)      Ellouise RN denies patient with the following vomiting, nausea, fever, chills  Protocols used: Wound Infection-A-AH  Message from [place] [facility] sent at 12/17/2024 12:54 PM EST  Reason for Triage: Ellouise , nurse at [facility] states the patient has a open wound on his right leg, its getting worse. Its oozing,causing patient pain and has fever in it.

## 2024-12-19 ENCOUNTER — Other Ambulatory Visit: Payer: Self-pay | Admitting: Nurse Practitioner

## 2024-12-20 ENCOUNTER — Encounter (HOSPITAL_BASED_OUTPATIENT_CLINIC_OR_DEPARTMENT_OTHER): Payer: Self-pay | Admitting: Family Medicine

## 2024-12-20 ENCOUNTER — Ambulatory Visit (HOSPITAL_BASED_OUTPATIENT_CLINIC_OR_DEPARTMENT_OTHER): Payer: Self-pay | Admitting: Family Medicine

## 2024-12-20 ENCOUNTER — Ambulatory Visit (INDEPENDENT_AMBULATORY_CARE_PROVIDER_SITE_OTHER): Admitting: Family Medicine

## 2024-12-20 VITALS — BP 134/67 | HR 59 | Resp 18 | Ht 70.0 in | Wt 248.0 lb

## 2024-12-20 DIAGNOSIS — I83018 Varicose veins of right lower extremity with ulcer other part of lower leg: Secondary | ICD-10-CM | POA: Diagnosis not present

## 2024-12-20 DIAGNOSIS — N182 Chronic kidney disease, stage 2 (mild): Secondary | ICD-10-CM

## 2024-12-20 DIAGNOSIS — E1122 Type 2 diabetes mellitus with diabetic chronic kidney disease: Secondary | ICD-10-CM | POA: Diagnosis not present

## 2024-12-20 DIAGNOSIS — I10 Essential (primary) hypertension: Secondary | ICD-10-CM

## 2024-12-20 DIAGNOSIS — L03115 Cellulitis of right lower limb: Secondary | ICD-10-CM | POA: Diagnosis not present

## 2024-12-20 DIAGNOSIS — N1831 Chronic kidney disease, stage 3a: Secondary | ICD-10-CM | POA: Diagnosis not present

## 2024-12-20 DIAGNOSIS — L97811 Non-pressure chronic ulcer of other part of right lower leg limited to breakdown of skin: Secondary | ICD-10-CM | POA: Diagnosis not present

## 2024-12-20 DIAGNOSIS — Z794 Long term (current) use of insulin: Secondary | ICD-10-CM

## 2024-12-20 LAB — POCT GLYCOSYLATED HEMOGLOBIN (HGB A1C): Hemoglobin A1C: 8.5 % — AB (ref 4.0–5.6)

## 2024-12-20 MED ORDER — CEPHALEXIN 500 MG PO CAPS: 500.0000 mg | ORAL_CAPSULE | Freq: Four times a day (QID) | ORAL | 0 refills | Status: AC

## 2024-12-20 MED ORDER — PREGABALIN 50 MG PO CAPS: 50.0000 mg | ORAL_CAPSULE | Freq: Every day | ORAL | 1 refills | Status: AC

## 2024-12-20 MED ORDER — CLOPIDOGREL BISULFATE 75 MG PO TABS: 75.0000 mg | ORAL_TABLET | Freq: Every day | ORAL | 0 refills | Status: AC

## 2024-12-20 MED ORDER — MOUNJARO 2.5 MG/0.5ML ~~LOC~~ SOAJ: 2.5000 mg | SUBCUTANEOUS | 1 refills | Status: AC

## 2024-12-20 MED ORDER — AMLODIPINE BESYLATE 10 MG PO TABS: 10.0000 mg | ORAL_TABLET | Freq: Every day | ORAL | 0 refills | Status: AC

## 2024-12-20 MED ORDER — AMIODARONE HCL 200 MG PO TABS: 200.0000 mg | ORAL_TABLET | Freq: Every day | ORAL | 0 refills | Status: AC

## 2024-12-20 MED ORDER — METOPROLOL TARTRATE 50 MG PO TABS: 50.0000 mg | ORAL_TABLET | Freq: Two times a day (BID) | ORAL | 3 refills | Status: AC

## 2024-12-20 MED ORDER — FINASTERIDE 5 MG PO TABS: 5.0000 mg | ORAL_TABLET | Freq: Every day | ORAL | 0 refills | Status: AC

## 2024-12-20 NOTE — Progress Notes (Signed)
 "   Procedures performed today:    None.  Independent interpretation of notes and tests performed by another provider:   None.  Brief History, Exam, Impression, and Recommendations:    BP (!) 143/66 (BP Location: Left Arm, Patient Position: Sitting, Cuff Size: Large)   Pulse 62   Resp 18   Ht 5' 10 (1.778 m)   Wt 248 lb (112.5 kg)   SpO2 99%   BMI 35.58 kg/m   Type 2 diabetes mellitus with stage 2 chronic kidney disease, with long-term current use of insulin  [facility]) Assessment & Plan: Patient continues with insulin  and metformin .  He has not been following up consistently with endocrinologist.  Was previously utilizing Mounjaro  as prescribed by endocrinology - indicates being on 5 mg dose, however stopped about 3 to 6 months ago.  Was not having any specific side effects with medication, just indicates that he ended up not seeing endocrinologist consistently antibiotics stopped medication I do feel that patient would benefit from being on Mounjaro  due to benefits with this medication.  He would like to resume medication as well.  We will restart with low-dose at 2.5 mg and can increase dose after 1 month.  Can consider further titration of medication past 5 mg dose if tolerating well in order to appropriately achieve blood sugar control as well as gradual weight loss A1c checked in office today Will plan to complete foot exam and urine ACR at future appointment  Orders: -     POCT glycosylated hemoglobin (Hb A1C)  Primary hypertension Assessment & Plan: Blood pressure borderline controlled in office today.  He does continue to follow-up regularly with cardiology, next appointment is in about 2 weeks.  He continues with amiodarone , amlodipine , isosorbide  mononitrate, metoprolol .  Requesting refill of some medications today, refilled as below.  Recommend continued follow-up with cardiology.  Recommend intermittent monitoring of blood pressure at home, DASH diet  Orders: -      amLODIPine  Besylate; Take 1 tablet (10 mg total) by mouth daily.  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  Unfortunately, has had some worsening with wound drainage.  He has also had increased pain as well as surrounding redness. On exam, there is notable erythema over right distal lower extremity.  Concern for superimposed bacterial infection. At his last visit with vascular specialist, plan was for as needed follow-up.  Given some worsening with associated venous stasis, recommend for patient to schedule follow-up visit with specialist Concern for associated cellulitis at this time as well.   Stage 3a chronic kidney disease (HCC) Assessment & Plan: Noted on prior labs, eGFR has been fluctuating within stage 3a range.  We will need to be mindful of this moving forward as this can impact medication choice as well as dosages.  It would be important to closely monitor blood pressure and blood sugars and control these over the long-term to delay progression of CKD   Morbid obesity (HCC) Assessment & Plan: BMI currently greater than 35 with associated comorbidities including diabetes.  Recommend working towards gradual weight loss.  We will also be restarting GLP-1 which can additionally benefit from a weight loss standpoint. We will continue to monitor weight moving forward.  Recommend lifestyle modifications to assist with weight loss efforts   Cellulitis of  right lower extremity Assessment & Plan: Patient has had some worsening pain, erythema, swelling.  Concern for superimposed bacterial infection with venous stasis dermatitis present with skin breakdown. Given this, recommend beginning antibiotic therapy, prescription sent to pharmacy on file.  Patient will also  contact vascular specialist to schedule follow-up.   Other orders -     Amiodarone  HCl; Take 1 tablet (200 mg total) by mouth daily.  Dispense: 90 tablet; Refill: 0 -     Clopidogrel  Bisulfate; Take 1 tablet (75 mg total) by mouth daily.  Dispense: 90 tablet; Refill: 0 -     Metoprolol  Tartrate; Take 1 tablet (50 mg total) by mouth 2 (two) times daily.  Dispense: 180 tablet; Refill: 3 -     Finasteride ; Take 1 tablet (5 mg total) by mouth at bedtime.  Dispense: 90 tablet; Refill: 0 -     Pregabalin ; Take 1 capsule (50 mg total) by mouth daily.  Dispense: 90 capsule; Refill: 1 -     Mounjaro ; Inject 2.5 mg into the skin once a week.  Dispense: 2 mL; Refill: 1 -     Cephalexin ; Take 1 capsule (500 mg total) by mouth 4 (four) times daily.  Dispense: 28 capsule; Refill: 0  Return in about 4 months (around 04/19/2025) for diabetes, hypertension, 40 minutes.  Spent 43 minutes on this patient encounter, including preparation, chart review, face-to-face counseling with patient and coordination of care, and documentation of encounter   ___________________________________________ Ashanna Heinsohn de [place], MD, ABFM, CAQSM Primary Care and Sports Medicine [facility] "

## 2024-12-20 NOTE — Assessment & Plan Note (Signed)
 Patient has had some worsening pain, erythema, swelling.  Concern for superimposed bacterial infection with venous stasis dermatitis present with skin breakdown. Given this, recommend beginning antibiotic therapy, prescription sent to pharmacy on file.  Patient will also contact vascular specialist to schedule follow-up.

## 2024-12-20 NOTE — Assessment & Plan Note (Signed)
 Patient was having some improvement and home health nurse was helping with dressing changes and wound care.  Unfortunately, has had some worsening with wound drainage.  He has also had increased pain as well as surrounding redness. On exam, there is notable erythema over right distal lower extremity.  Concern for superimposed bacterial infection. At his last visit with vascular specialist, plan was for as needed follow-up.  Given some worsening with associated venous stasis, recommend for patient to schedule follow-up visit with specialist Concern for associated cellulitis at this time as well.

## 2024-12-20 NOTE — Assessment & Plan Note (Signed)
 Noted on prior labs, eGFR has been fluctuating within stage 3a range.  We will need to be mindful of this moving forward as this can impact medication choice as well as dosages.  It would be important to closely monitor blood pressure and blood sugars and control these over the long-term to delay progression of CKD

## 2024-12-20 NOTE — Assessment & Plan Note (Signed)
 Patient continues with insulin  and metformin .  He has not been following up consistently with endocrinologist.  Was previously utilizing Mounjaro  as prescribed by endocrinology - indicates being on 5 mg dose, however stopped about 3 to 6 months ago.  Was not having any specific side effects with medication, just indicates that he ended up not seeing endocrinologist consistently antibiotics stopped medication I do feel that patient would benefit from being on Mounjaro  due to benefits with this medication.  He would like to resume medication as well.  We will restart with low-dose at 2.5 mg and can increase dose after 1 month.  Can consider further titration of medication past 5 mg dose if tolerating well in order to appropriately achieve blood sugar control as well as gradual weight loss A1c checked in office today Will plan to complete foot exam and urine ACR at future appointment

## 2024-12-20 NOTE — Assessment & Plan Note (Signed)
 BMI currently greater than 35 with associated comorbidities including diabetes.  Recommend working towards gradual weight loss.  We will also be restarting GLP-1 which can additionally benefit from a weight loss standpoint. We will continue to monitor weight moving forward.  Recommend lifestyle modifications to assist with weight loss efforts

## 2024-12-20 NOTE — Assessment & Plan Note (Signed)
 Blood pressure borderline controlled in office today.  He does continue to follow-up regularly with cardiology, next appointment is in about 2 weeks.  He continues with amiodarone , amlodipine , isosorbide  mononitrate, metoprolol .  Requesting refill of some medications today, refilled as below.  Recommend continued follow-up with cardiology.  Recommend intermittent monitoring of blood pressure at home, DASH diet

## 2024-12-21 ENCOUNTER — Telehealth: Payer: Self-pay

## 2024-12-21 NOTE — Telephone Encounter (Signed)
 Spoke with Ellouise, RN [facility] regarding patient's new leg wounds.  Patient recently started antibiotic prescribed by PCP for possible cellulitis.  Ellouise continues to monitor the pt's leg.   Daughter is aware to have pt complete the whole round of abx. She is aware to keep the wounds clean and dry. She is aware to go back to PCP or ED if signs of infection develop.

## 2025-01-03 ENCOUNTER — Ambulatory Visit: Admitting: Emergency Medicine

## 2025-01-29 ENCOUNTER — Encounter (HOSPITAL_BASED_OUTPATIENT_CLINIC_OR_DEPARTMENT_OTHER)
